# Patient Record
Sex: Female | Born: 1940
Health system: Southern US, Community
[De-identification: ages and names within clinical notes are randomized; demographics above are authoritative.]

## PROBLEM LIST (undated history)

## (undated) DIAGNOSIS — Z7189 Other specified counseling: Secondary | ICD-10-CM

## (undated) DIAGNOSIS — Z808 Family history of malignant neoplasm of other organs or systems: Secondary | ICD-10-CM

## (undated) DIAGNOSIS — C50212 Malignant neoplasm of upper-inner quadrant of left female breast: Secondary | ICD-10-CM

## (undated) DIAGNOSIS — F329 Major depressive disorder, single episode, unspecified: Secondary | ICD-10-CM

## (undated) DIAGNOSIS — H269 Unspecified cataract: Secondary | ICD-10-CM

## (undated) DIAGNOSIS — F419 Anxiety disorder, unspecified: Secondary | ICD-10-CM

## (undated) DIAGNOSIS — R55 Syncope and collapse: Secondary | ICD-10-CM

## (undated) DIAGNOSIS — K219 Gastro-esophageal reflux disease without esophagitis: Secondary | ICD-10-CM

## (undated) DIAGNOSIS — Z8042 Family history of malignant neoplasm of prostate: Secondary | ICD-10-CM

## (undated) DIAGNOSIS — E079 Disorder of thyroid, unspecified: Secondary | ICD-10-CM

## (undated) DIAGNOSIS — E785 Hyperlipidemia, unspecified: Secondary | ICD-10-CM

## (undated) DIAGNOSIS — Z5189 Encounter for other specified aftercare: Secondary | ICD-10-CM

## (undated) DIAGNOSIS — Z923 Personal history of irradiation: Secondary | ICD-10-CM

## (undated) DIAGNOSIS — Z803 Family history of malignant neoplasm of breast: Secondary | ICD-10-CM

## (undated) DIAGNOSIS — I639 Cerebral infarction, unspecified: Secondary | ICD-10-CM

## (undated) DIAGNOSIS — C50919 Malignant neoplasm of unspecified site of unspecified female breast: Secondary | ICD-10-CM

## (undated) DIAGNOSIS — C801 Malignant (primary) neoplasm, unspecified: Secondary | ICD-10-CM

## (undated) DIAGNOSIS — J45909 Unspecified asthma, uncomplicated: Secondary | ICD-10-CM

## (undated) DIAGNOSIS — T7840XA Allergy, unspecified, initial encounter: Secondary | ICD-10-CM

## (undated) DIAGNOSIS — I10 Essential (primary) hypertension: Secondary | ICD-10-CM

## (undated) DIAGNOSIS — Z9221 Personal history of antineoplastic chemotherapy: Secondary | ICD-10-CM

## (undated) DIAGNOSIS — E669 Obesity, unspecified: Secondary | ICD-10-CM

## (undated) DIAGNOSIS — N189 Chronic kidney disease, unspecified: Secondary | ICD-10-CM

## (undated) DIAGNOSIS — D649 Anemia, unspecified: Secondary | ICD-10-CM

## (undated) DIAGNOSIS — E039 Hypothyroidism, unspecified: Secondary | ICD-10-CM

## (undated) HISTORY — PX: TONSILLECTOMY: SUR1361

## (undated) HISTORY — DX: Personal history of irradiation: Z92.3

## (undated) HISTORY — DX: Disorder of thyroid, unspecified: E07.9

## (undated) HISTORY — DX: Unspecified cataract: H26.9

## (undated) HISTORY — DX: Unspecified asthma, uncomplicated: J45.909

## (undated) HISTORY — DX: Major depressive disorder, single episode, unspecified: F32.9

## (undated) HISTORY — PX: WISDOM TOOTH EXTRACTION: SHX21

## (undated) HISTORY — DX: Anxiety disorder, unspecified: F41.9

## (undated) HISTORY — DX: Family history of malignant neoplasm of prostate: Z80.42

## (undated) HISTORY — PX: ABDOMINAL HYSTERECTOMY: SHX81

## (undated) HISTORY — DX: Hyperlipidemia, unspecified: E78.5

## (undated) HISTORY — PX: CATARACT EXTRACTION: SUR2

## (undated) HISTORY — DX: Chronic kidney disease, unspecified: N18.9

## (undated) HISTORY — DX: Family history of malignant neoplasm of breast: Z80.3

## (undated) HISTORY — DX: Syncope and collapse: R55

## (undated) HISTORY — PX: OTHER SURGICAL HISTORY: SHX169

## (undated) HISTORY — DX: Family history of malignant neoplasm of other organs or systems: Z80.8

## (undated) HISTORY — PX: ROTATOR CUFF REPAIR: SHX139

## (undated) HISTORY — DX: Obesity, unspecified: E66.9

## (undated) HISTORY — DX: Allergy, unspecified, initial encounter: T78.40XA

## (undated) HISTORY — DX: Other specified counseling: Z71.89

## (undated) HISTORY — DX: Malignant neoplasm of upper-inner quadrant of left female breast: C50.212

## (undated) HISTORY — PX: COLONOSCOPY: SHX174

## (undated) HISTORY — DX: Gastro-esophageal reflux disease without esophagitis: K21.9

## (undated) HISTORY — DX: Anemia, unspecified: D64.9

## (undated) HISTORY — DX: Encounter for other specified aftercare: Z51.89

## (undated) HISTORY — DX: Essential (primary) hypertension: I10

## (undated) HISTORY — PX: EXCISIONAL HEMORRHOIDECTOMY: SHX1541

---

## 1999-04-02 ENCOUNTER — Ambulatory Visit (HOSPITAL_COMMUNITY): Admission: RE | Admit: 1999-04-02 | Discharge: 1999-04-02 | Payer: Self-pay | Admitting: Obstetrics & Gynecology

## 1999-12-29 ENCOUNTER — Encounter (INDEPENDENT_AMBULATORY_CARE_PROVIDER_SITE_OTHER): Payer: Self-pay | Admitting: Specialist

## 1999-12-29 ENCOUNTER — Ambulatory Visit (HOSPITAL_COMMUNITY): Admission: RE | Admit: 1999-12-29 | Discharge: 1999-12-29 | Payer: Self-pay | Admitting: *Deleted

## 2000-07-02 ENCOUNTER — Encounter: Payer: Self-pay | Admitting: Internal Medicine

## 2000-07-02 ENCOUNTER — Encounter: Admission: RE | Admit: 2000-07-02 | Discharge: 2000-07-02 | Payer: Self-pay | Admitting: Internal Medicine

## 2001-03-30 ENCOUNTER — Other Ambulatory Visit: Admission: RE | Admit: 2001-03-30 | Discharge: 2001-03-30 | Payer: Self-pay | Admitting: Obstetrics and Gynecology

## 2001-07-14 ENCOUNTER — Encounter: Admission: RE | Admit: 2001-07-14 | Discharge: 2001-07-14 | Payer: Self-pay | Admitting: Internal Medicine

## 2001-07-14 ENCOUNTER — Encounter: Payer: Self-pay | Admitting: Internal Medicine

## 2002-07-18 ENCOUNTER — Encounter: Payer: Self-pay | Admitting: Obstetrics and Gynecology

## 2002-07-18 ENCOUNTER — Encounter: Admission: RE | Admit: 2002-07-18 | Discharge: 2002-07-18 | Payer: Self-pay | Admitting: Obstetrics and Gynecology

## 2002-07-20 ENCOUNTER — Encounter: Payer: Self-pay | Admitting: Obstetrics and Gynecology

## 2002-07-20 ENCOUNTER — Encounter: Admission: RE | Admit: 2002-07-20 | Discharge: 2002-07-20 | Payer: Self-pay | Admitting: Obstetrics and Gynecology

## 2002-09-25 ENCOUNTER — Encounter (INDEPENDENT_AMBULATORY_CARE_PROVIDER_SITE_OTHER): Payer: Self-pay | Admitting: Specialist

## 2002-09-25 ENCOUNTER — Ambulatory Visit (HOSPITAL_COMMUNITY): Admission: RE | Admit: 2002-09-25 | Discharge: 2002-09-25 | Payer: Self-pay | Admitting: Internal Medicine

## 2003-04-04 ENCOUNTER — Encounter: Payer: Self-pay | Admitting: Internal Medicine

## 2003-04-04 ENCOUNTER — Encounter: Admission: RE | Admit: 2003-04-04 | Discharge: 2003-04-04 | Payer: Self-pay | Admitting: Internal Medicine

## 2003-04-23 ENCOUNTER — Ambulatory Visit (HOSPITAL_COMMUNITY): Admission: RE | Admit: 2003-04-23 | Discharge: 2003-04-23 | Payer: Self-pay | Admitting: *Deleted

## 2003-09-25 ENCOUNTER — Encounter: Admission: RE | Admit: 2003-09-25 | Discharge: 2003-09-25 | Payer: Self-pay | Admitting: Internal Medicine

## 2003-10-05 ENCOUNTER — Other Ambulatory Visit: Admission: RE | Admit: 2003-10-05 | Discharge: 2003-10-05 | Payer: Self-pay | Admitting: Endocrinology

## 2004-10-20 ENCOUNTER — Encounter: Admission: RE | Admit: 2004-10-20 | Discharge: 2004-10-20 | Payer: Self-pay | Admitting: Internal Medicine

## 2005-11-13 ENCOUNTER — Encounter: Admission: RE | Admit: 2005-11-13 | Discharge: 2005-11-13 | Payer: Self-pay | Admitting: Internal Medicine

## 2006-08-24 ENCOUNTER — Other Ambulatory Visit: Admission: RE | Admit: 2006-08-24 | Discharge: 2006-08-24 | Payer: Self-pay | Admitting: Cardiology

## 2006-11-15 ENCOUNTER — Encounter: Admission: RE | Admit: 2006-11-15 | Discharge: 2006-11-15 | Payer: Self-pay | Admitting: Internal Medicine

## 2007-11-18 ENCOUNTER — Encounter: Admission: RE | Admit: 2007-11-18 | Discharge: 2007-11-18 | Payer: Self-pay | Admitting: Internal Medicine

## 2008-06-14 ENCOUNTER — Encounter (INDEPENDENT_AMBULATORY_CARE_PROVIDER_SITE_OTHER): Payer: Self-pay | Admitting: *Deleted

## 2008-06-14 ENCOUNTER — Ambulatory Visit (HOSPITAL_COMMUNITY): Admission: RE | Admit: 2008-06-14 | Discharge: 2008-06-14 | Payer: Self-pay | Admitting: *Deleted

## 2008-08-05 ENCOUNTER — Emergency Department (HOSPITAL_COMMUNITY): Admission: EM | Admit: 2008-08-05 | Discharge: 2008-08-05 | Payer: Self-pay | Admitting: Family Medicine

## 2008-11-20 ENCOUNTER — Encounter: Admission: RE | Admit: 2008-11-20 | Discharge: 2008-11-20 | Payer: Self-pay | Admitting: Internal Medicine

## 2009-12-05 ENCOUNTER — Encounter: Admission: RE | Admit: 2009-12-05 | Discharge: 2009-12-05 | Payer: Self-pay | Admitting: Internal Medicine

## 2011-01-06 ENCOUNTER — Other Ambulatory Visit: Payer: Self-pay | Admitting: Internal Medicine

## 2011-01-06 DIAGNOSIS — Z1231 Encounter for screening mammogram for malignant neoplasm of breast: Secondary | ICD-10-CM

## 2011-01-28 ENCOUNTER — Ambulatory Visit
Admission: RE | Admit: 2011-01-28 | Discharge: 2011-01-28 | Disposition: A | Discharge status: Home / Self Care | Payer: Medicare Other | Source: Ambulatory Visit | Attending: Internal Medicine | Admitting: Internal Medicine

## 2011-01-28 DIAGNOSIS — Z1231 Encounter for screening mammogram for malignant neoplasm of breast: Secondary | ICD-10-CM

## 2011-03-17 NOTE — Op Note (Signed)
NAME:  Jessica [name], Jessica [name] NO.:  [id number]   MEDICAL RECORD NO.:  [id number]          PATIENT TYPE:  AMB   LOCATION:  ENDO                         FACILITY:  [facility]   PHYSICIAN:  Georgiana Spinner, M.D.    DATE OF BIRTH:  July 25, 1941   DATE OF PROCEDURE:  06/14/2008  DATE OF DISCHARGE:                               OPERATIVE REPORT   PROCEDURE:  Colonoscopy.   INDICATIONS:  Colon cancer screening, colon polyps.   ANESTHESIA:  Fentanyl 50 mcg, Versed 6 mg.   PROCEDURE IN DETAIL:  With the patient mildly sedated in the left  lateral decubitus position the Pentax videoscopic colonoscope was  inserted in the rectum and passed under direct vision to the sigmoid  colon but was difficult to pass any further  so I withdrew this  colonoscope taking circumferential views of colonic mucosa, stopping in  the rectum which appeared normal on direct and showed hemorrhoids on  retroflexed view.  The endoscope was straightened and withdrawn.  Subsequently the Pentax videoscopic pediatric colonoscope was inserted  in the rectum and passed under direct vision again with pressure applied  and subsequently with the patient rolled to her back we were able to  advance to the cecum which was identified by base of cecum and ileocecal  valve, both of which were photographed.  From this point the colonoscope  was slowly withdrawn taking circumferential views of colonic mucosa,  stopping only in the ascending colon near the hepatic flexure where a  polyp was seen, photographed and removed using hot biopsy forceps  technique at a setting of 20/150 blended current.  We then withdrew all  the way to the rectum, which appeared normal on indirect view and I did  not retroflex once again.  The endoscope was withdrawn.  The patient's  vital signs, pulse oximeter remained stable.  The patient tolerated the  procedure well without apparent complications.   FINDINGS:  Polyp at hepatic flexure, internal  hemorrhoids seen on  retroflexed view, otherwise unremarkable examination.   PLAN:  Await biopsy report.  The patient will call me for results and  follow up with me as needed as an outpatient.           ______________________________  Georgiana Spinner, M.D.     GMO/MEDQ  D:  06/14/2008  T:  06/14/2008  Job:  [id number]

## 2011-03-17 NOTE — Op Note (Signed)
NAME:  Jessica [name], Jessica [name] NO.:  [id number]   MEDICAL RECORD NO.:  [id number]          PATIENT TYPE:  AMB   LOCATION:  ENDO                         FACILITY:  [facility]   PHYSICIAN:  Georgiana Spinner, M.D.    DATE OF BIRTH:  1941-03-02   DATE OF PROCEDURE:  06/14/2008  DATE OF DISCHARGE:                               OPERATIVE REPORT   PROCEDURE:  Upper endoscopy.   INDICATIONS:  GERD symptoms of belching, burping, late-onset asthma and  reflux.   ANESTHESIA:  Fentanyl 75 mcg, Versed 6 mg.   PROCEDURE:  With the patient mildly sedated in the left lateral  decubitus position, the Pentax videoscopic endoscope was inserted in the  mouth, passed under direct vision through the esophagus which appeared  normal into the stomach.  Fundus, body, antrum, duodenal bulb, second  portion of the duodenum were visualized.  From this point, the endoscope  was slowly withdrawn taking circumferential views of duodenal mucosa  until the endoscope been pulled back into stomach and placed in  retroflexion to view the stomach from below, and it was noted that there  was some glandular-appearing material on the gastric side of the  squamocolumnar junction.  No evidence of Barrett's was seen, however,  but biopsy of this area was taken.  The endoscope was straightened and  withdrawn taking circumferential views remaining gastric and esophageal  mucosa.  The patient's vital signs and pulse oximeter remained stable.  The patient tolerated the procedure well without apparent complications.   FINDINGS:  Changes in the mucosa of the stomach just distal to the  squamocolumnar junction.  Await biopsy report.  The patient will call me  for results and follow-up with me as an outpatient.  Proceed to  colonoscopy           ______________________________  Georgiana Spinner, M.D.     GMO/MEDQ  D:  06/14/2008  T:  06/14/2008  Job:  [id number]

## 2011-03-20 NOTE — Op Note (Signed)
   NAME:  Jessica [name], PAT NO.:  [id number]   MEDICAL RECORD NO.:  [id number]                   PATIENT TYPE:  AMB   LOCATION:  ENDO                                 FACILITY:  [facility]   PHYSICIAN:  Georgiana Spinner, M.D.                 DATE OF BIRTH:  01/06/1941   DATE OF PROCEDURE:  04/23/2003  DATE OF DISCHARGE:                                 OPERATIVE REPORT   PROCEDURE:  Upper endoscopy.   INDICATIONS:  GERD.   ANESTHESIA:  Demerol 50 mg, Versed 5 mg.   PROCEDURE:  With the patient mildly sedated in the left lateral decubitus  position, the Olympus videoscopic endoscopic endoscope was inserted in the  mouth, passed under direct vision through the esophagus, which appeared  normal, into the stomach, where a small amount of blood flecks were seen and  photographed.  We advanced to the duodenal bulb, second portion of the  duodenum, both of which appeared normal.  From this point the endoscope was  slowly withdrawn, taking circumferential views of the duodenal mucosa until  the endoscope had been pulled back into the stomach, placed in retroflexion  to view the stomach from below.  It was noted that the GE junction did not  wrap tightly around the endoscope, and this was photographed.  The endoscope  was then straightened and withdrawn, taking circumferential views of the  remaining gastric and esophageal mucosa.  The patient's vital signs and  pulse oximetry remained stable.  The patient tolerated the procedure well  without apparent complications.   FINDINGS:  Incomplete wrap of the gastroesophageal junction around the  endoscope, otherwise an unremarkable examination other than some sparse  amount of blood flecks in the stomach.   PLAN:  Proceed to colonoscopy.                                               Georgiana Spinner, M.D.    GMO/MEDQ  D:  04/23/2003  T:  04/24/2003  Job:  [id number]

## 2011-03-20 NOTE — Op Note (Signed)
   NAME:  Jessica [name], DOUTT NO.:  [id number]   MEDICAL RECORD NO.:  [id number]                   PATIENT TYPE:  AMB   LOCATION:  ENDO                                 FACILITY:  [facility]   PHYSICIAN:  Georgiana Spinner, M.D.                 DATE OF BIRTH:  11-23-1940   DATE OF PROCEDURE:  DATE OF DISCHARGE:                                 OPERATIVE REPORT   PROCEDURE:  Colonoscopy.   INDICATIONS FOR PROCEDURE:  Colon polyps.   ANESTHESIA:  Demerol 20, Versed 2 mg.   DESCRIPTION OF PROCEDURE:  With the patient mildly sedated in the left  lateral decubitus position, the Olympus videoscopic colonoscope was inserted  in the rectum and passed under direct vision to the cecum identified by the  ileocecal valve and appendiceal orifice both of which were photographed.  From this point, the colonoscope was slowly withdrawn taking circumferential  views of the entire colonic mucosa stopping only in the rectum which  appeared normal on direct and showed hemorrhoids on retroflexed view. The  endoscope was straightened and withdrawn. The patient's vital signs and  pulse oximeter remained stable. The patient tolerated the procedure well  without apparent complications.   FINDINGS:  Internal hemorrhoids, otherwise, unremarkable colonoscopic  examination to the cecum.                                               Georgiana Spinner, M.D.    GMO/MEDQ  D:  04/23/2003  T:  04/24/2003  Job:  [id number]

## 2011-03-20 NOTE — Op Note (Signed)
   NAME:  Jessica [name], Jessica [name]                       ACCOUNT NO.:  [id number]   MEDICAL RECORD NO.:  [id number]                   PATIENT TYPE:  AMB   LOCATION:  SDC                                  FACILITY:  [facility]   PHYSICIAN:  Malachi Pro. Ambrose Mantle, M.D.              DATE OF BIRTH:  1941/04/11   DATE OF PROCEDURE:  09/25/2002  DATE OF DISCHARGE:                                 OPERATIVE REPORT   PREOPERATIVE DIAGNOSES:  1. Perineal cyst.  2. Dyspareunia.   POSTOPERATIVE DIAGNOSES:  1. Perineal cyst.  2. Dyspareunia.   OPERATION:  Removal of perineal cyst.   OPERATOR:  Malachi Pro. Ambrose Mantle, M.D.   ANESTHESIA:  1% Xylocaine local block 9 cc.   PROCEDURE:  The patient was placed in lithotomy position in [place]lik stirrups.  The area around the lesion on the perineum was prepped with Betadine  solution.  The area was draped as a sterile field.  9 cc of 1% Xylocaine was  injected all the way around the lesion and inferior to the lesion.  This  amount was necessary to give the patient complete anesthesia.  An elliptical  incision was then made around the cystic lesion which was just distal to the  vaginal introitus and went down to about one-half way on the perineum.  I  incised around the lesion and then by picking up on the incised skin that  was not part of the cyst I was able to elevate the lesion and gradually cut  the tissue surrounding the lesion so that the lesion was removed intact.  It  was about a 1 cm lesion.  A 4-0 Vicryl suture was used to reconstruct the  perineum with a running locked suture through the subcutaneous tissue and  then a running locked suture through the skin.  The patient seemed to  tolerate the procedure well.  Blood loss was no more than 5 or 10 cc and the  patient was returned to recovery in satisfactory condition.                                               Malachi Pro. Ambrose Mantle, M.D.    TFH/MEDQ  D:  09/25/2002  T:  09/25/2002  Job:  [id number]

## 2011-04-02 ENCOUNTER — Emergency Department (HOSPITAL_COMMUNITY)
Admission: EM | Admit: 2011-04-02 | Discharge: 2011-04-02 | Disposition: A | Discharge status: Home / Self Care | Payer: Medicare Other | Attending: Emergency Medicine | Admitting: Emergency Medicine

## 2011-04-02 DIAGNOSIS — E7800 Pure hypercholesterolemia, unspecified: Secondary | ICD-10-CM | POA: Insufficient documentation

## 2011-04-02 DIAGNOSIS — I10 Essential (primary) hypertension: Secondary | ICD-10-CM | POA: Insufficient documentation

## 2011-04-02 DIAGNOSIS — Z79899 Other long term (current) drug therapy: Secondary | ICD-10-CM | POA: Insufficient documentation

## 2011-04-02 DIAGNOSIS — R42 Dizziness and giddiness: Secondary | ICD-10-CM | POA: Insufficient documentation

## 2011-04-02 DIAGNOSIS — R112 Nausea with vomiting, unspecified: Secondary | ICD-10-CM | POA: Insufficient documentation

## 2011-04-02 DIAGNOSIS — R197 Diarrhea, unspecified: Secondary | ICD-10-CM | POA: Insufficient documentation

## 2011-04-02 DIAGNOSIS — R61 Generalized hyperhidrosis: Secondary | ICD-10-CM | POA: Insufficient documentation

## 2011-04-02 DIAGNOSIS — R109 Unspecified abdominal pain: Secondary | ICD-10-CM | POA: Insufficient documentation

## 2011-04-02 DIAGNOSIS — R55 Syncope and collapse: Secondary | ICD-10-CM | POA: Insufficient documentation

## 2011-04-02 DIAGNOSIS — E039 Hypothyroidism, unspecified: Secondary | ICD-10-CM | POA: Insufficient documentation

## 2011-04-02 LAB — GLUCOSE, CAPILLARY: Glucose-Capillary: 102 mg/dL — ABNORMAL HIGH (ref 70–99)

## 2011-04-02 LAB — POCT I-STAT, CHEM 8
BUN: 12 mg/dL (ref 6–23)
Calcium, Ion: 1.19 mmol/L (ref 1.12–1.32)
Chloride: 104 mEq/L (ref 96–112)
Creatinine, Ser: 1.1 mg/dL (ref 0.4–1.2)
Glucose, Bld: 116 mg/dL — ABNORMAL HIGH (ref 70–99)
HCT: 40 % (ref 36.0–46.0)
Hemoglobin: 13.6 g/dL (ref 12.0–15.0)
Potassium: 4.1 mEq/L (ref 3.5–5.1)
Sodium: 141 mEq/L (ref 135–145)
TCO2: 27 mmol/L (ref 0–100)

## 2012-01-25 ENCOUNTER — Other Ambulatory Visit: Payer: Self-pay | Admitting: Internal Medicine

## 2012-01-25 DIAGNOSIS — Z1231 Encounter for screening mammogram for malignant neoplasm of breast: Secondary | ICD-10-CM

## 2012-02-03 ENCOUNTER — Ambulatory Visit
Admission: RE | Admit: 2012-02-03 | Discharge: 2012-02-03 | Disposition: A | Discharge status: Home / Self Care | Payer: Medicare Other | Source: Ambulatory Visit | Attending: Internal Medicine | Admitting: Internal Medicine

## 2012-02-03 DIAGNOSIS — Z1231 Encounter for screening mammogram for malignant neoplasm of breast: Secondary | ICD-10-CM

## 2013-04-03 ENCOUNTER — Other Ambulatory Visit: Payer: Self-pay

## 2013-04-03 DIAGNOSIS — Z1231 Encounter for screening mammogram for malignant neoplasm of breast: Secondary | ICD-10-CM

## 2013-05-02 ENCOUNTER — Ambulatory Visit
Admission: RE | Admit: 2013-05-02 | Discharge: 2013-05-02 | Disposition: A | Discharge status: Home / Self Care | Payer: Medicare Other | Source: Ambulatory Visit

## 2013-05-02 DIAGNOSIS — Z1231 Encounter for screening mammogram for malignant neoplasm of breast: Secondary | ICD-10-CM

## 2013-07-21 ENCOUNTER — Encounter: Payer: Self-pay | Admitting: Gastroenterology

## 2013-08-29 ENCOUNTER — Encounter: Payer: Self-pay | Admitting: Cardiology

## 2013-08-29 ENCOUNTER — Ambulatory Visit (INDEPENDENT_AMBULATORY_CARE_PROVIDER_SITE_OTHER): Payer: Medicare Other | Admitting: Cardiology

## 2013-08-29 VITALS — BP 128/70 | HR 75 | Ht 61.0 in | Wt 174.7 lb

## 2013-08-29 DIAGNOSIS — F329 Major depressive disorder, single episode, unspecified: Secondary | ICD-10-CM

## 2013-08-29 DIAGNOSIS — R55 Syncope and collapse: Secondary | ICD-10-CM

## 2013-08-29 DIAGNOSIS — F3289 Other specified depressive episodes: Secondary | ICD-10-CM

## 2013-08-29 DIAGNOSIS — F32A Depression, unspecified: Secondary | ICD-10-CM

## 2013-08-29 DIAGNOSIS — E669 Obesity, unspecified: Secondary | ICD-10-CM

## 2013-08-29 DIAGNOSIS — I10 Essential (primary) hypertension: Secondary | ICD-10-CM

## 2013-08-29 DIAGNOSIS — E785 Hyperlipidemia, unspecified: Secondary | ICD-10-CM

## 2013-08-29 NOTE — Patient Instructions (Signed)
Keep hydrate, when you urinate ,your urine should be clear  Your physician wants you to follow-up in 12 MONTH DR [facility].  You will receive a reminder letter in the mail two months in advance. If you don't receive a letter, please call our office to schedule the follow-up appointment.

## 2013-08-29 NOTE — Progress Notes (Signed)
PATIENT: Jessica [name] MRN: [id number]  DOB: 1941-04-16   DOV:08/31/2013 PCP: Jessica Moh, MD  Clinic Note: Chief Complaint  Patient presents with  . ROV 1 year    RAW-DH. No complaints. Pt did have pneumonia-w/in the last month.   HPI: Jessica [name] is a 72 y.o. female with a PMH below (most notably vasovagal near syncope) who presents today for one-year followup. She is a for patient of Dr. Alanda Amass to be off last in July 2013. He states that he saw her in June of 2012 to evaluate 3 near-syncopal episodes with the last one being in May of that year. She had transient episode without frank loss of consciousness she does have a mild short PR interval, there's been no history or documentation of palpitations or tachyarrhythmias. Dr. Alanda Amass felt these symptoms were likely related to neurocardiogenic vasovagal reactions. She has had several cardiac studies done as part of her evaluations:   Myoview stress test that was negative for ischemia. The patient was able to walk 8:11 minutes reaching 10 METs with no chest pain or ECG changes.   2-D echocardiogram" no significant valvular lesion. No structural heart disease. Normal LV function. He did not place a monitor, as her episodes of near-syncope were buried widely spread.  Interval History: Since 2012, she has not had any recurrent symptoms. She denies any rapid or irregular heartbeats. No syncope or near-syncope. No lightheadedness or dizziness with the exception of occasionally after straining hard bowel movement. She has occasional mild bleeding hemorrhoids, but no real major melena or hematochezia. At no time has she ever had any chest tightness or pressure with rest or exertion. No PND, orthopnea or edema. No TIA or amaurosis fugax symptoms.  Past Medical History  Diagnosis Date  . Essential hypertension - well controlled 08/31/2013  . Vasovagal near-syncope -rare 08/31/2013  . Dyslipidemia, goal LDL below 160  08/31/2013  . Obesity (BMI 30-39.9) 08/31/2013  . Depression 08/31/2013    Prior Cardiac Evaluation and Past Surgical History: History reviewed. No pertinent past surgical history.  Allergies  Allergen Reactions  . Penicillins Hives    History   Social History Narrative   Married mother of 3 with one grandchild. Never smoked. Does not drink alcohol.   Walks 3-4 days a week for roughly 20-30 minutes.    ROS: A comprehensive Review of Systems - Essentially negative is not indicated above. She notes that she is recovering from her recent recovery from her recent bout of pneumonia. She is feeling very poorly with fevers and chills and coughing, she went to urgent care clinic and was diagnosed with pneumonia based on examination. She started antibiotics and felt much better since. However she does still feel a little bit fatigued while recovering.  PHYSICAL EXAM BP 128/70  Pulse 75  Ht 5\' 1"  (1.549 m)  Wt 174 lb 11.2 oz (79.243 kg)  BMI 33.03 kg/m2 General appearance: alert, cooperative, appears stated age and no distress Neck: no adenopathy, no carotid bruit, no JVD and supple, symmetrical, trachea midline Lungs: clear to auscultation bilaterally, normal percussion bilaterally and Nonlabored, good air movement Heart: regular rate and rhythm, S1, S2 normal, soft systolic murmur at the left border, click, rub or gallop and normal apical impulse Abdomen: soft, non-tender; bowel sounds normal; no masses,  no organomegaly Extremities: extremities normal, atraumatic, no cyanosis or edema Pulses: 2+ and symmetric Neurologic: Alert and oriented X 3, normal strength and tone. Normal symmetric reflexes. Normal coordination and gait Cranial nerves:  normal  UJW:[id number] today: Yes Rate: 75 , Rhythm: NSR, LAFB, incomplete RBBB, T. inversions in 1 and aVL previously documented; short PR interval 130 ms (stable)  Recent Labs: None available  ASSESSMENT / PLAN: Vasovagal near-syncope  -rare Relatively stable. No active symptoms. She has not had any palpitations. Due to the potential of orthostatic symptoms, Dr. Alanda Amass at reduced the ACE inhibitor dose to 10 mg, however this is not recorded on her medications. I want her to good date the 10th of the 20 mg dose.  For now watch for monitoring. Ensuring adequate hydration. Avoiding hypotension with aggressive blood pressure control.  Essential hypertension - well controlled ACE inhibitor and HCTZ. Monitor for hypovolemia and hypotension given the history of vasovagal symptoms.  Dyslipidemia, goal LDL below 160 On statin, monitored by PCP.  Obesity (BMI 30-39.9) I did talk about the importance of dietary modification and exercise to avoid weight gain. Low position would try to lose some weight to get down below the obesity range. Target would be up to 3 pounds per month.   No orders of the defined types were placed in this encounter.    Followup: One year  Yosselyn Tax W. Herbie Baltimore, M.D., M.S. [facility] [address] [place], [place]  [address]  [phone] Pager # [phone]

## 2013-08-31 ENCOUNTER — Encounter: Payer: Self-pay | Admitting: Cardiology

## 2013-08-31 DIAGNOSIS — E669 Obesity, unspecified: Secondary | ICD-10-CM | POA: Insufficient documentation

## 2013-08-31 DIAGNOSIS — I10 Essential (primary) hypertension: Secondary | ICD-10-CM

## 2013-08-31 DIAGNOSIS — E785 Hyperlipidemia, unspecified: Secondary | ICD-10-CM

## 2013-08-31 DIAGNOSIS — R55 Syncope and collapse: Secondary | ICD-10-CM

## 2013-08-31 DIAGNOSIS — F329 Major depressive disorder, single episode, unspecified: Secondary | ICD-10-CM | POA: Insufficient documentation

## 2013-08-31 DIAGNOSIS — F32A Depression, unspecified: Secondary | ICD-10-CM

## 2013-08-31 HISTORY — DX: Depression, unspecified: F32.A

## 2013-08-31 HISTORY — DX: Obesity, unspecified: E66.9

## 2013-08-31 HISTORY — DX: Hyperlipidemia, unspecified: E78.5

## 2013-08-31 HISTORY — DX: Essential (primary) hypertension: I10

## 2013-08-31 HISTORY — DX: Syncope and collapse: R55

## 2013-08-31 NOTE — Assessment & Plan Note (Signed)
ACE inhibitor and HCTZ. Monitor for hypovolemia and hypotension given the history of vasovagal symptoms.

## 2013-08-31 NOTE — Assessment & Plan Note (Signed)
Relatively stable. No active symptoms. She has not had any palpitations. Due to the potential of orthostatic symptoms, Dr. Alanda Amass at reduced the ACE inhibitor dose to 10 mg, however this is not recorded on her medications. I want her to good date the 10th of the 20 mg dose.  For now watch for monitoring. Ensuring adequate hydration. Avoiding hypotension with aggressive blood pressure control.

## 2013-08-31 NOTE — Assessment & Plan Note (Signed)
I did talk about the importance of dietary modification and exercise to avoid weight gain. Low position would try to lose some weight to get down below the obesity range. Target would be up to 3 pounds per month.

## 2013-08-31 NOTE — Assessment & Plan Note (Signed)
On statin, monitored by PCP. 

## 2013-09-12 ENCOUNTER — Emergency Department (HOSPITAL_COMMUNITY)
Admission: EM | Admit: 2013-09-12 | Discharge: 2013-09-12 | Disposition: A | Discharge status: Home / Self Care | Payer: Medicare Other | Source: Home / Self Care

## 2013-09-12 ENCOUNTER — Encounter (HOSPITAL_COMMUNITY): Payer: Self-pay | Admitting: Emergency Medicine

## 2013-09-12 DIAGNOSIS — R0982 Postnasal drip: Secondary | ICD-10-CM

## 2013-09-12 DIAGNOSIS — J029 Acute pharyngitis, unspecified: Secondary | ICD-10-CM

## 2013-09-12 LAB — POCT RAPID STREP A: Streptococcus, Group A Screen (Direct): NEGATIVE

## 2013-09-12 MED ORDER — IPRATROPIUM BROMIDE 0.06 % NA SOLN: 2.0000 | Freq: Four times a day (QID) | NASAL | Status: DC | PRN

## 2013-09-12 MED ORDER — CETIRIZINE HCL 10 MG PO TABS: 10.0000 mg | ORAL_TABLET | Freq: Every day | ORAL | Status: DC

## 2013-09-12 NOTE — ED Provider Notes (Signed)
CSN: [id number]     Arrival date & time 09/12/13  1135 History   None    Chief Complaint  Patient presents with  . Sore Throat   (Consider location/radiation/quality/duration/timing/severity/associated sxs/prior Treatment) HPI Comments: 72 year old female presents complaining of sore throat. This began 3 days ago. She has constant sore throat that is worse in the morning when she first wakes up. She has taken Tylenol and saltwater gargles for this which has not helped. She denies fever, chills, NVD, rhinorrhea, postnasal drip, fatigue, or any other symptoms. She does not feel ill at this time.  Patient is a 72 y.o. female presenting with pharyngitis.  Sore Throat Pertinent negatives include no chest pain, no abdominal pain and no shortness of breath.    Past Medical History  Diagnosis Date  . Essential hypertension - well controlled 08/31/2013  . Vasovagal near-syncope -rare 08/31/2013  . Dyslipidemia, goal LDL below 160 08/31/2013  . Obesity (BMI 30-39.9) 08/31/2013  . Depression 08/31/2013   History reviewed. No pertinent past surgical history. History reviewed. No pertinent family history. History  Substance Use Topics  . Smoking status: Never Smoker   . Smokeless tobacco: Never Used  . Alcohol Use: No   OB History   Grav Para Term Preterm Abortions TAB SAB Ect Mult Living                 Review of Systems  Constitutional: Negative for fever and chills.  HENT: Positive for sore throat.   Eyes: Negative for visual disturbance.  Respiratory: Negative for cough and shortness of breath.   Cardiovascular: Negative for chest pain, palpitations and leg swelling.  Gastrointestinal: Negative for nausea, vomiting and abdominal pain.  Endocrine: Negative for polydipsia and polyuria.  Genitourinary: Negative for dysuria, urgency and frequency.  Musculoskeletal: Negative for arthralgias and myalgias.  Skin: Negative for rash.  Neurological: Negative for dizziness, weakness and  light-headedness.    Allergies  Codeine and Penicillins  Home Medications   Current Outpatient Rx  Name  Route  Sig  Dispense  Refill  . benazepril (LOTENSIN) 20 MG tablet   Oral   Take 20 mg by mouth daily.         . calcium citrate-vitamin D (CITRACAL+D) 315-200 MG-UNIT per tablet   Oral   Take 1 tablet by mouth daily.         . cetirizine (ZYRTEC) 10 MG tablet   Oral   Take 1 tablet (10 mg total) by mouth daily.   30 tablet   0   . CRESTOR 10 MG tablet   Oral   Take 5 mg by mouth at bedtime.         . diphenhydramine-acetaminophen (TYLENOL PM) 25-500 MG TABS   Oral   Take 1 tablet by mouth at bedtime as needed.         . hydrochlorothiazide (MICROZIDE) 12.5 MG capsule   Oral   Take 12.5 mg by mouth daily.         Marland Kitchen ipratropium (ATROVENT) 0.06 % nasal spray   Each Nare   Place 2 sprays into both nostrils 4 (four) times daily as needed for rhinitis.   15 mL   12   . levothyroxine (SYNTHROID, LEVOTHROID) 75 MCG tablet   Oral   Take 75 mcg by mouth daily.         . Multiple Vitamin (MULTIVITAMIN) tablet   Oral   Take 1 tablet by mouth daily.         Marland Kitchen  omeprazole (PRILOSEC OTC) 20 MG tablet   Oral   Take 20 mg by mouth daily.         Marland Kitchen venlafaxine XR (EFFEXOR-XR) 75 MG 24 hr capsule   Oral   Take 225 mg by mouth daily.          BP 136/73  Pulse 74  Temp(Src) 98.6 F (37 C) (Oral)  Resp 18  SpO2 99% Physical Exam  Nursing note and vitals reviewed. Constitutional: She is oriented to person, place, and time. Vital signs are normal. She appears well-developed and well-nourished. No distress.  HENT:  Head: Normocephalic and atraumatic.  Mouth/Throat: Posterior oropharyngeal erythema (mild) present. No oropharyngeal exudate or posterior oropharyngeal edema.  Cobblestoning of the posterior oropharynx and posterior tongue  Pulmonary/Chest: Effort normal. No respiratory distress.  Neurological: She is alert and oriented to person, place,  and time. She has normal strength. Coordination normal.  Skin: Skin is warm and dry. No rash noted. She is not diaphoretic.  Psychiatric: She has a normal mood and affect. Judgment normal.    ED Course  Procedures (including critical care time) Labs Review Labs Reviewed  POCT RAPID STREP A ([facility]RG CARE ONLY)   Imaging Review No results found.    MDM   1. Pharyngitis   2. Postnasal drip    Sore throat is most likely being caused by postnasal drip. Rapid strep is negative. Treating with Zyrtec and with Atrovent nasal spray. Followup when necessary.   Meds ordered this encounter  Medications  . ipratropium (ATROVENT) 0.06 % nasal spray    Sig: Place 2 sprays into both nostrils 4 (four) times daily as needed for rhinitis.    Dispense:  15 mL    Refill:  12    Order Specific Question:  Supervising Provider    Answer:  Linna Hoff [id number]  . cetirizine (ZYRTEC) 10 MG tablet    Sig: Take 1 tablet (10 mg total) by mouth daily.    Dispense:  30 tablet    Refill:  0    Order Specific Question:  Supervising Provider    Answer:  Bradd Canary D [[id number]]       Graylon Good, PA-C 09/12/13 1253  Graylon Good, PA-C 09/12/13 1254

## 2013-09-12 NOTE — ED Notes (Signed)
Pt  Reports    Symptoms of  sorethroat     With  Pain  When  She swallows    With  The  Symptoms  For    About  3  Days         -  She  Is  Sitting  Upright on the  Exam table in no  Acute  Distress              She  Reports  Had  pnuemonia  About 1  Month ago  Which  Resolved

## 2013-09-14 ENCOUNTER — Telehealth (HOSPITAL_COMMUNITY): Payer: Self-pay | Admitting: *Deleted

## 2013-09-14 LAB — CULTURE, GROUP A STREP

## 2013-09-14 NOTE — ED Provider Notes (Signed)
Medical screening examination/treatment/procedure(s) were performed by resident physician or non-physician practitioner and as supervising physician I was immediately available for consultation/collaboration.   Gussie Murton DOUGLAS MD.   Roth Ress D Loretta Kluender, MD 09/14/13 1919 

## 2013-09-14 NOTE — ED Notes (Signed)
Throat culture: Group A Strep (S. Pyogenes).  Discussed with Dr. Artis Flock and ordered Z-pak -take as directed. I called and left a message for pt. to call. Vassie Moselle 09/14/2013

## 2013-10-03 ENCOUNTER — Encounter: Payer: Medicare Other | Admitting: Gastroenterology

## 2013-10-20 ENCOUNTER — Encounter: Payer: Self-pay | Admitting: Cardiovascular Disease

## 2013-12-05 ENCOUNTER — Encounter: Payer: Medicare Other | Admitting: Gastroenterology

## 2014-01-03 ENCOUNTER — Ambulatory Visit (AMBULATORY_SURGERY_CENTER): Payer: Self-pay

## 2014-01-03 VITALS — Ht 61.5 in | Wt 170.0 lb

## 2014-01-03 DIAGNOSIS — Z860100 Personal history of colon polyps, unspecified: Secondary | ICD-10-CM

## 2014-01-03 MED ORDER — MOVIPREP 100 G PO SOLR: 1.0000 | Freq: Once | ORAL | Status: DC

## 2014-01-08 ENCOUNTER — Encounter: Payer: Self-pay | Admitting: Gastroenterology

## 2014-01-16 ENCOUNTER — Ambulatory Visit (AMBULATORY_SURGERY_CENTER): Payer: Medicare Other | Admitting: Gastroenterology

## 2014-01-16 ENCOUNTER — Encounter: Payer: Self-pay | Admitting: Gastroenterology

## 2014-01-16 VITALS — BP 134/69 | HR 64 | Temp 98.4°F | Resp 14 | Ht 61.5 in | Wt 170.0 lb

## 2014-01-16 DIAGNOSIS — D126 Benign neoplasm of colon, unspecified: Secondary | ICD-10-CM

## 2014-01-16 DIAGNOSIS — Z86010 Personal history of colonic polyps: Secondary | ICD-10-CM

## 2014-01-16 MED ORDER — SODIUM CHLORIDE 0.9 % IV SOLN: 500.0000 mL | INTRAVENOUS | Status: DC

## 2014-01-16 NOTE — Progress Notes (Signed)
Scope change from 04 to 55 a pediatric scope.

## 2014-01-16 NOTE — Progress Notes (Signed)
Called to room to assist during endoscopic procedure.  Patient ID and intended procedure confirmed with present staff. Received instructions for my participation in the procedure from the performing physician.  

## 2014-01-16 NOTE — Progress Notes (Signed)
Report to pacu RN, vss, bbs=clear, Sx during case for clear saliva while "dry Heving", BBS=, Clear throughout procedure airway reflexes intact.

## 2014-01-16 NOTE — Op Note (Signed)
[place]  [address] [place], [id number]   COLONOSCOPY PROCEDURE REPORT PATIENT: Jessica [name], Jessica [name]  MR#: [id number] BIRTHDATE: 29-Nov-1940 , 72  yrs. old GENDER: Female ENDOSCOPIST: Ladene Artist, MD, [facility] REFERRED JO:INOMVE Delight Hoh, M.D. PROCEDURE DATE:  01/16/2014 PROCEDURE:   Colonoscopy with snare polypectomy First Screening Colonoscopy - Avg.  risk and is 50 yrs.  old or older - No.  Prior Negative Screening - Now for repeat screening. N/A  History of Adenoma - Now for follow-up colonoscopy & has been > or = to 3 yrs.  Yes hx of adenoma.  Has been 3 or more years since last colonoscopy.  Polyps Removed Today? Yes. ASA CLASS:   Class II INDICATIONS:Patient's personal history of adenomatous colon polyps.  MEDICATIONS: MAC sedation, administered by CRNA and propofol (Diprivan) 300mg  IV DESCRIPTION OF PROCEDURE:   After the risks benefits and alternatives of the procedure were thoroughly explained, informed consent was obtained.  A digital rectal exam revealed external hemorrhoids.   The LB [id number]  endoscope was introduced through the anus and advanced to the cecum, which was identified by both the appendix and ileocecal valve. No adverse events experienced with a tortuous colon.   The quality of the prep was adequate after extensive rinsing and suctionin, using MoviPrep  The instrument was then slowly withdrawn as the colon was fully examined.  COLON FINDINGS: A sessile polyp measuring 1 cm in size was found at the cecum.  A polypectomy was performed using snare cautery.  The resection was complete and the polyp tissue was completely retrieved.   A sessile polyp measuring 6 mm in size was found in the transverse colon.  A polypectomy was performed with a cold snare.  The resection was complete and the polyp tissue was completely retrieved.   The colon was otherwise normal.  There was no diverticulosis, inflammation, polyps or cancers  unless previously stated.  Retroflexed views revealed internal hemorrhoids. The time to cecum=4 minutes 44 seconds.  Withdrawal time=17 minutes 09 seconds.  The scope was withdrawn and the procedure completed. COMPLICATIONS: There were no complications.  ENDOSCOPIC IMPRESSION: 1.   Sessile polyp measuring 1 cm at the cecum; polypectomy performed using snare cautery 2.   Sessile polyp measuring 6 mm in the transverse colon; polypectomy performed with a cold snare 3.   Moderate internal and external hemorrhoids  RECOMMENDATIONS: 1.  Hold aspirin, aspirin products, and anti-inflammatory medication for 2 weeks. 2.  Await pathology results 3.  Repeat Colonoscopy in 3 years with a more extensive bowel prep.  eSigned:  Ladene Artist, MD, [facility] 01/16/2014 8:40 AM

## 2014-01-16 NOTE — Patient Instructions (Signed)

## 2014-01-17 ENCOUNTER — Telehealth: Payer: Self-pay

## 2014-01-17 NOTE — Telephone Encounter (Signed)
  Follow up Call-  Call back number 01/16/2014  Post procedure Call Back phone  # cell - [phone]   Permission to leave phone message Yes     Patient questions:  Do you have a fever, pain , or abdominal swelling? no Pain Score  0 *  Have you tolerated food without any problems? yes  Have you been able to return to your normal activities? yes  Do you have any questions about your discharge instructions: Diet   no Medications  no Follow up visit  no  Do you have questions or concerns about your Care? no  Actions: * If pain score is 4 or above: No action needed, pain <4.  Per the pt she complained of abdominal cramping yesterday.  She also said her arms and legs both hurt yesterday also.  I explained we put a lot of air in her colon during the exam and that could cause abdominal cramping.  I asked if she passed a good amount of gas.  Pt said, "yes".  I advised her to continue to pass gas if any left in her colon.  She said she would.  As for her extrimity pain, I explained we started her IV and she had a blood pressure cuff on her arm, but I did not think the pain was from her colonoscopy.  I advised if any of her sx do not improve to please call us back.  She said she would call if needed. maw

## 2014-01-22 ENCOUNTER — Encounter: Payer: Self-pay | Admitting: Gastroenterology

## 2014-07-11 ENCOUNTER — Other Ambulatory Visit: Payer: Self-pay

## 2014-07-11 DIAGNOSIS — Z1231 Encounter for screening mammogram for malignant neoplasm of breast: Secondary | ICD-10-CM

## 2014-07-19 ENCOUNTER — Ambulatory Visit
Admission: RE | Admit: 2014-07-19 | Discharge: 2014-07-19 | Disposition: A | Discharge status: Home / Self Care | Payer: Medicare Other | Source: Ambulatory Visit

## 2014-07-19 DIAGNOSIS — Z1231 Encounter for screening mammogram for malignant neoplasm of breast: Secondary | ICD-10-CM

## 2014-11-06 ENCOUNTER — Encounter: Payer: Self-pay | Admitting: Cardiology

## 2014-11-06 ENCOUNTER — Ambulatory Visit (INDEPENDENT_AMBULATORY_CARE_PROVIDER_SITE_OTHER): Payer: 59 | Admitting: Cardiology

## 2014-11-06 VITALS — BP 118/80 | HR 73 | Ht 62.0 in | Wt 178.8 lb

## 2014-11-06 DIAGNOSIS — I10 Essential (primary) hypertension: Secondary | ICD-10-CM

## 2014-11-06 DIAGNOSIS — R55 Syncope and collapse: Secondary | ICD-10-CM

## 2014-11-06 DIAGNOSIS — E785 Hyperlipidemia, unspecified: Secondary | ICD-10-CM

## 2014-11-06 DIAGNOSIS — E669 Obesity, unspecified: Secondary | ICD-10-CM

## 2014-11-06 NOTE — Patient Instructions (Signed)
Your physician has recommended you make the following change in your medication...  1. STOP hydrochlorothiazide 2. START coenzyme Q10 100mg  daily for 7 days  >> increase this by 100mg  each week until you get to 300mg  daily  >> take this with crestor  >> if this does not help your crestor side effects, Dr. Ellyn Hack said you can take crestor every other day  Your physician recommends that you schedule a follow-up appointment as needed.

## 2014-11-08 ENCOUNTER — Encounter: Payer: Self-pay | Admitting: Cardiology

## 2014-11-08 NOTE — Assessment & Plan Note (Signed)
The patient understands the need to lose weight with diet and exercise. We have discussed specific strategies for this.  

## 2014-11-08 NOTE — Assessment & Plan Note (Signed)
No active symptoms to suggest recurrent syncopal spells. Stay adequately hydrated, we can stop HCTZ and this may recur more dehydrated. Avoid aggressive blood pressure control. Avoid constipation. Continue with the lower dose of ACE inhibitor.  At this point he is relatively stable upon does not need Routine cardiology followup. She can see [place] on a when necessary basis.

## 2014-11-08 NOTE — Progress Notes (Signed)
PATIENT: Jessica [name] MRN: [id number]  DOB: 28-May-1941   DOV:11/08/2014 PCP: Horatio Pel, MD  Clinic Note: Chief Complaint  Patient presents with  . Annual Exam    c/o having a cold; no complaints  . Loss of Consciousness    Last episode in June 2012   HPI: Jessica [name] is a 74 y.o. female with a PMH below (most notably vasovagal near syncope) who presents today for one-year followup for history of Vasovagal syncope episodes x 3 in June 2012.Marland Kitchen She is a for patient of Dr. Rollene Fare to be off last in July 2013. He states that he saw her in June of 2012 to evaluate 3 near-syncopal episodes with the last one being in May of that year. Dr. Rollene Fare felt these symptoms were likely related to neurocardiogenic vasovagal reactions. She has had several cardiac studies done as part of her evaluations:   Myoview stress test that was negative for ischemia. The patient was able to walk 8:11 minutes reaching 10 METs with no chest pain or ECG changes.   2-D echocardiogram" no significant valvular lesion. No structural heart disease. Normal LV function. He did not place a monitor, as her episodes of near-syncope were buried widely spread.  Interval History: Since 2012, she continues to deny any recurrent recurrent symptoms. She says that the episodes were related to having constipation and straining. She has adjusted her diet such that she no longer has that issue and therefore is not passing out spells. Her ACE inhibitor dose was reduced to 10 mg when I saw her so she is not having more orthostatic dizziness.  She denies any rapid or irregular heartbeats. No syncope or near-syncope. No lightheadedness or dizziness with the exception of occasionally after straining hard bowel movement. She has occasional mild bleeding hemorrhoids, but no real major melena or hematochezia. At no time has she ever had any chest tightness or pressure with rest or exertion. No PND, orthopnea or edema. No TIA  or amaurosis fugax symptoms.  Past Medical History  Diagnosis Date  . Essential hypertension - well controlled 08/31/2013  . Vasovagal near-syncope -rare 08/31/2013  . Dyslipidemia, goal LDL below 160 08/31/2013  . Obesity (BMI 30-39.9) 08/31/2013  . Depression 08/31/2013  . Allergy   . Anemia   . Anxiety   . Asthma     in past, no inhalers now  . Blood transfusion without reported diagnosis   . Cataract     bilateral removed  . GERD (gastroesophageal reflux disease)   . Chronic kidney disease     kidney stones  . Thyroid disease     Allergies  Allergen Reactions  . Codeine Hives  . Penicillins Hives    History   Social History Narrative   Married mother of 3 with one grandchild. Never smoked. Does not drink alcohol.   Walks 3-4 days a week for roughly 20-30 minutes.    ROS: A comprehensive Review of Systems - Essentially negative is not indicated above.  She does have a little cramping she notes when she's been taking her Crestor so she has been reluctant to take it although she does take it.  PHYSICAL EXAM BP 118/80 mmHg  Pulse 73  Ht 5\' 2"  (1.575 m)  Wt 178 lb 12.8 oz (81.103 kg)  BMI 32.69 kg/m2 General appearance: alert, cooperative, appears stated age and no distress Neck: no adenopathy, no carotid bruit, no JVD and supple, symmetrical, trachea midline Lungs: clear to auscultation bilaterally, normal percussion bilaterally and  Nonlabored, good air movement Heart: regular rate and rhythm, S1, S2 normal, soft systolic murmur at the left border, click, rub or gallop and normal apical impulse Abdomen: soft, non-tender; bowel sounds normal; no masses,  no organomegaly Extremities: extremities normal, atraumatic, no cyanosis or edema Pulses: 2+ and symmetric Neurologic: Alert and oriented X 3, normal strength and tone. Normal symmetric reflexes. Normal coordination and gait Cranial nerves: normal  [id number] today: Yes Rate: 73, Rhythm: NSR, normal axis,  borderline incomplete RBBB, T. inversions in 1 and aVL previously documented; short PR interval 144 ms (stable) --> LAFB axis no longer present, otherwise stable / Normal EKG  Recent Labs: None available - lipids monitored by PCP.  ASSESSMENT / PLAN: Vasovagal near-syncope -rare No active symptoms to suggest recurrent syncopal spells. Stay adequately hydrated, we can stop HCTZ and this may recur more dehydrated. Avoid aggressive blood pressure control. Avoid constipation. Continue with the lower dose of ACE inhibitor.  At this point he is relatively stable upon does not need Routine cardiology followup. She can see [place] on a when necessary basis.  Essential hypertension - well controlled With her cramping she can probably stop HCTZ and will use it as needed for swelling. It may just simply increase the likelihood of dehydration and worsening constipation both make her more susceptible to syncope.  Dyslipidemia, goal LDL below 160 On statin -- followed by PCP. I think to try also the cramping and aching to try adding coenzyme Q 10. coenzyme Q10 100mg  daily for 7 days  >> increase this by 100mg  each week until you get to 300mg  daily  >> take this with crestor  >> if this does not help your crestor side effects, --> can take crestor every other day  Obesity (BMI 30-39.9) The patient understands the need to lose weight with diet and exercise. We have discussed specific strategies for this.    Orders Placed This Encounter  Procedures  . EKG 12-Lead    Followup: PRN  Leonie Man, M.D., M.S. Interventional Cardiologist   Pager # [phone]

## 2014-11-08 NOTE — Assessment & Plan Note (Signed)
With her cramping she can probably stop HCTZ and will use it as needed for swelling. It may just simply increase the likelihood of dehydration and worsening constipation both make her more susceptible to syncope.

## 2014-11-08 NOTE — Assessment & Plan Note (Addendum)
On statin -- followed by PCP. I think to try also the cramping and aching to try adding coenzyme Q 10. coenzyme Q10 100mg  daily for 7 days  >> increase this by 100mg  each week until you get to 300mg  daily  >> take this with crestor  >> if this does not help your crestor side effects, --> can take crestor every other day

## 2014-11-19 DIAGNOSIS — E780 Pure hypercholesterolemia: Secondary | ICD-10-CM | POA: Diagnosis not present

## 2014-11-21 DIAGNOSIS — E780 Pure hypercholesterolemia: Secondary | ICD-10-CM | POA: Diagnosis not present

## 2014-11-21 DIAGNOSIS — K219 Gastro-esophageal reflux disease without esophagitis: Secondary | ICD-10-CM | POA: Diagnosis not present

## 2014-11-21 DIAGNOSIS — F329 Major depressive disorder, single episode, unspecified: Secondary | ICD-10-CM | POA: Diagnosis not present

## 2014-11-21 DIAGNOSIS — E039 Hypothyroidism, unspecified: Secondary | ICD-10-CM | POA: Diagnosis not present

## 2014-12-25 DIAGNOSIS — J45909 Unspecified asthma, uncomplicated: Secondary | ICD-10-CM | POA: Diagnosis not present

## 2015-05-22 DIAGNOSIS — E039 Hypothyroidism, unspecified: Secondary | ICD-10-CM | POA: Diagnosis not present

## 2015-05-22 DIAGNOSIS — K219 Gastro-esophageal reflux disease without esophagitis: Secondary | ICD-10-CM | POA: Diagnosis not present

## 2015-05-22 DIAGNOSIS — I10 Essential (primary) hypertension: Secondary | ICD-10-CM | POA: Diagnosis not present

## 2015-05-22 DIAGNOSIS — E780 Pure hypercholesterolemia: Secondary | ICD-10-CM | POA: Diagnosis not present

## 2015-11-19 ENCOUNTER — Other Ambulatory Visit: Payer: Self-pay

## 2015-11-19 DIAGNOSIS — Z1231 Encounter for screening mammogram for malignant neoplasm of breast: Secondary | ICD-10-CM

## 2015-12-05 ENCOUNTER — Ambulatory Visit
Admission: RE | Admit: 2015-12-05 | Discharge: 2015-12-05 | Disposition: A | Discharge status: Home / Self Care | Payer: Medicare Other | Source: Ambulatory Visit

## 2015-12-05 DIAGNOSIS — Z1231 Encounter for screening mammogram for malignant neoplasm of breast: Secondary | ICD-10-CM | POA: Diagnosis not present

## 2015-12-11 ENCOUNTER — Other Ambulatory Visit: Payer: Self-pay | Admitting: Internal Medicine

## 2015-12-11 DIAGNOSIS — R928 Other abnormal and inconclusive findings on diagnostic imaging of breast: Secondary | ICD-10-CM

## 2015-12-16 ENCOUNTER — Ambulatory Visit
Admission: RE | Admit: 2015-12-16 | Discharge: 2015-12-16 | Disposition: A | Discharge status: Home / Self Care | Payer: Medicare Other | Source: Ambulatory Visit | Attending: Internal Medicine | Admitting: Internal Medicine

## 2015-12-16 DIAGNOSIS — N6002 Solitary cyst of left breast: Secondary | ICD-10-CM | POA: Diagnosis not present

## 2015-12-16 DIAGNOSIS — N63 Unspecified lump in breast: Secondary | ICD-10-CM | POA: Diagnosis not present

## 2015-12-16 DIAGNOSIS — R928 Other abnormal and inconclusive findings on diagnostic imaging of breast: Secondary | ICD-10-CM

## 2016-04-21 DIAGNOSIS — H26492 Other secondary cataract, left eye: Secondary | ICD-10-CM | POA: Diagnosis not present

## 2016-04-21 DIAGNOSIS — Z961 Presence of intraocular lens: Secondary | ICD-10-CM | POA: Diagnosis not present

## 2016-04-21 DIAGNOSIS — H43813 Vitreous degeneration, bilateral: Secondary | ICD-10-CM | POA: Diagnosis not present

## 2016-04-21 DIAGNOSIS — H04123 Dry eye syndrome of bilateral lacrimal glands: Secondary | ICD-10-CM | POA: Diagnosis not present

## 2016-05-09 ENCOUNTER — Emergency Department (HOSPITAL_COMMUNITY)
Admission: EM | Admit: 2016-05-09 | Discharge: 2016-05-09 | Disposition: A | Discharge status: Home / Self Care | Payer: No Typology Code available for payment source | Attending: Emergency Medicine | Admitting: Emergency Medicine

## 2016-05-09 ENCOUNTER — Encounter (HOSPITAL_COMMUNITY): Payer: Self-pay | Admitting: Emergency Medicine

## 2016-05-09 DIAGNOSIS — S20219A Contusion of unspecified front wall of thorax, initial encounter: Secondary | ICD-10-CM | POA: Insufficient documentation

## 2016-05-09 DIAGNOSIS — Y92410 Unspecified street and highway as the place of occurrence of the external cause: Secondary | ICD-10-CM | POA: Diagnosis not present

## 2016-05-09 DIAGNOSIS — S299XXA Unspecified injury of thorax, initial encounter: Secondary | ICD-10-CM | POA: Diagnosis present

## 2016-05-09 DIAGNOSIS — Z79899 Other long term (current) drug therapy: Secondary | ICD-10-CM | POA: Diagnosis not present

## 2016-05-09 DIAGNOSIS — J45909 Unspecified asthma, uncomplicated: Secondary | ICD-10-CM | POA: Insufficient documentation

## 2016-05-09 DIAGNOSIS — Y939 Activity, unspecified: Secondary | ICD-10-CM | POA: Insufficient documentation

## 2016-05-09 DIAGNOSIS — S20212A Contusion of left front wall of thorax, initial encounter: Secondary | ICD-10-CM | POA: Diagnosis not present

## 2016-05-09 DIAGNOSIS — I129 Hypertensive chronic kidney disease with stage 1 through stage 4 chronic kidney disease, or unspecified chronic kidney disease: Secondary | ICD-10-CM | POA: Diagnosis not present

## 2016-05-09 DIAGNOSIS — M545 Low back pain: Secondary | ICD-10-CM | POA: Diagnosis not present

## 2016-05-09 DIAGNOSIS — N189 Chronic kidney disease, unspecified: Secondary | ICD-10-CM | POA: Insufficient documentation

## 2016-05-09 DIAGNOSIS — Y999 Unspecified external cause status: Secondary | ICD-10-CM | POA: Diagnosis not present
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
## 2016-08-25 DIAGNOSIS — J069 Acute upper respiratory infection, unspecified: Secondary | ICD-10-CM | POA: Diagnosis not present

## 2016-11-02 DIAGNOSIS — Z9221 Personal history of antineoplastic chemotherapy: Secondary | ICD-10-CM

## 2016-11-02 HISTORY — DX: Personal history of antineoplastic chemotherapy: Z92.21

## 2016-12-02 ENCOUNTER — Encounter: Payer: Self-pay | Admitting: Gastroenterology

## 2017-01-18 ENCOUNTER — Encounter: Payer: Self-pay | Admitting: Gastroenterology

## 2017-01-18 ENCOUNTER — Other Ambulatory Visit: Payer: Self-pay | Admitting: Internal Medicine

## 2017-01-18 DIAGNOSIS — Z1231 Encounter for screening mammogram for malignant neoplasm of breast: Secondary | ICD-10-CM

## 2017-01-31 DIAGNOSIS — C801 Malignant (primary) neoplasm, unspecified: Secondary | ICD-10-CM

## 2017-01-31 DIAGNOSIS — C50919 Malignant neoplasm of unspecified site of unspecified female breast: Secondary | ICD-10-CM

## 2017-01-31 HISTORY — DX: Malignant neoplasm of unspecified site of unspecified female breast: C50.919

## 2017-01-31 HISTORY — DX: Malignant (primary) neoplasm, unspecified: C80.1

## 2017-02-04 ENCOUNTER — Ambulatory Visit
Admission: RE | Admit: 2017-02-04 | Discharge: 2017-02-04 | Disposition: A | Discharge status: Home / Self Care | Payer: Medicare Other | Source: Ambulatory Visit | Attending: Internal Medicine | Admitting: Internal Medicine

## 2017-02-04 DIAGNOSIS — Z1231 Encounter for screening mammogram for malignant neoplasm of breast: Secondary | ICD-10-CM

## 2017-02-05 ENCOUNTER — Other Ambulatory Visit: Payer: Self-pay | Admitting: Internal Medicine

## 2017-02-05 DIAGNOSIS — R928 Other abnormal and inconclusive findings on diagnostic imaging of breast: Secondary | ICD-10-CM

## 2017-02-09 ENCOUNTER — Ambulatory Visit
Admission: RE | Admit: 2017-02-09 | Discharge: 2017-02-09 | Disposition: A | Discharge status: Home / Self Care | Payer: Medicare Other | Source: Ambulatory Visit | Attending: Internal Medicine | Admitting: Internal Medicine

## 2017-02-09 ENCOUNTER — Other Ambulatory Visit: Payer: Self-pay | Admitting: Internal Medicine

## 2017-02-09 DIAGNOSIS — R921 Mammographic calcification found on diagnostic imaging of breast: Secondary | ICD-10-CM

## 2017-02-09 DIAGNOSIS — R928 Other abnormal and inconclusive findings on diagnostic imaging of breast: Secondary | ICD-10-CM

## 2017-02-23 ENCOUNTER — Inpatient Hospital Stay: Admission: RE | Admit: 2017-02-23 | Payer: Medicare Other | Source: Ambulatory Visit

## 2017-02-24 ENCOUNTER — Ambulatory Visit
Admission: RE | Admit: 2017-02-24 | Discharge: 2017-02-24 | Disposition: A | Discharge status: Home / Self Care | Payer: Medicare Other | Source: Ambulatory Visit | Attending: Internal Medicine | Admitting: Internal Medicine

## 2017-02-24 DIAGNOSIS — R921 Mammographic calcification found on diagnostic imaging of breast: Secondary | ICD-10-CM

## 2017-02-24 DIAGNOSIS — C50212 Malignant neoplasm of upper-inner quadrant of left female breast: Secondary | ICD-10-CM | POA: Diagnosis not present

## 2017-02-24 HISTORY — PX: BREAST BIOPSY: SHX20

## 2017-02-25 ENCOUNTER — Other Ambulatory Visit: Payer: Self-pay | Admitting: Internal Medicine

## 2017-02-25 DIAGNOSIS — C50912 Malignant neoplasm of unspecified site of left female breast: Secondary | ICD-10-CM

## 2017-02-26 ENCOUNTER — Ambulatory Visit
Admission: RE | Admit: 2017-02-26 | Discharge: 2017-02-26 | Disposition: A | Discharge status: Home / Self Care | Payer: Medicare Other | Source: Ambulatory Visit | Attending: Internal Medicine | Admitting: Internal Medicine

## 2017-02-26 DIAGNOSIS — C50912 Malignant neoplasm of unspecified site of left female breast: Secondary | ICD-10-CM

## 2017-02-26 DIAGNOSIS — N6489 Other specified disorders of breast: Secondary | ICD-10-CM | POA: Diagnosis not present

## 2017-02-26 HISTORY — DX: Malignant (primary) neoplasm, unspecified: C80.1

## 2017-02-26 HISTORY — DX: Malignant neoplasm of unspecified site of unspecified female breast: C50.919

## 2017-03-02 ENCOUNTER — Ambulatory Visit: Payer: Self-pay | Admitting: Surgery

## 2017-03-02 DIAGNOSIS — C50212 Malignant neoplasm of upper-inner quadrant of left female breast: Secondary | ICD-10-CM

## 2017-03-02 DIAGNOSIS — Z171 Estrogen receptor negative status [ER-]: Secondary | ICD-10-CM

## 2017-03-02 DIAGNOSIS — C50912 Malignant neoplasm of unspecified site of left female breast: Secondary | ICD-10-CM | POA: Diagnosis not present

## 2017-03-02 MED ORDER — DEXTROSE 5 % IV SOLN
600.0000 mg | Freq: Once | INTRAVENOUS | Status: AC
  Administered 2017-03-23: 600 mg via INTRAVENOUS

## 2017-03-02 NOTE — H&P (Signed)
Jessica [name] 03/02/2017 3:03 PM Location: [facility] Patient #: [id number] DOB: 1941-09-20 Married / Language: [place] / Race: White Female  History of Present Illness Maisie Fus A. Jeral Zick MD; 03/02/2017 5:18 PM) Patient words: Patient sent at the request of Dr. Linde Gillis for abnormal mammogram. Patient was noted to have a cluster of left breast microcalcifications upper inner quadrant measuring 1.5 cm there were atypical. Core biopsy was done which showed invasive ductal carcinoma ER negative PR negative HER-2/neu negative with a growth fraction of 70%. Patient has no family history of breast cancer. Patient denies any history of breast pain, nipple discharge or other issue with either breast.                          CLINICAL DATA: Screening. EXAM: 2D DIGITAL SCREENING BILATERAL MAMMOGRAM WITH CAD AND ADJUNCT TOMO COMPARISON: Previous exam(s). ACR Breast Density Category b: There are scattered areas of fibroglandular density. FINDINGS: In the left breast, calcifications warrant further evaluation. In the right breast, no findings suspicious for malignancy. Images were processed with CAD. IMPRESSION: Further evaluation is suggested for calcifications in the left breast. RECOMMENDATION: Diagnostic mammogram of the left breast. (Code:[id number]-57M) The patient will be contacted regarding the findings, and additional imaging will be scheduled. BI-RADS CATEGORY 0: Incomplete. Need additional imaging evaluation and/or prior mammograms for comparison. Electronically Signed By: Gerome Sam III M.D On: 02/04/2017 15:30   MM Digital Diagnostic Unilat L (Order [id number]) - Reflex for Order [id number] Study Result  CLINICAL DATA: Patient returns today to evaluate left breast calcifications identified on a recent screening mammogram. EXAM: DIGITAL DIAGNOSTIC LEFT MAMMOGRAM WITH CAD COMPARISON: Previous exams including recent screening mammogram  dated 02/04/2017. ACR Breast Density Category c: The breast tissue is heterogeneously dense, which may obscure small masses. FINDINGS: Grouped pleomorphic calcifications are confirmed within the upper inner quadrant of the left breast, spanning 1.5 cm. Additional scattered benign-appearing calcifications are seen within the outer left breast. Mammographic images were processed with CAD. IMPRESSION: Suspicious pleomorphic calcifications within the upper inner quadrant of the left breast, spanning 1.5 cm, for which stereotactic biopsy is recommended. RECOMMENDATION: Stereotactic biopsy of the left breast calcifications. Stereotactic biopsy is scheduled for 02/11/2017. I have discussed the findings and recommendations with the patient. Results were also provided in writing at the conclusion of the visit. If applicable, a reminder letter will be sent to the patient regarding the next appointment. BI-RADS CATEGORY 4: Suspicious. Electronically Signed By: Bary Richard M.D. On: 02/09/2017 13:50      for Jessica [name], Jessica [name] ([id number]) Patient: Jessica [name], Jessica [name] Collected: 02/24/2017 Client: [facility] Imaging Accession: [id number] Received: 02/24/2017 A. Olena Heckle, MD DOB: [birth year] Age: 11 Gender: F Reported: 02/25/2017 [address] Patient Ph: [phone] MRN #: [id number] [place], [place] [address] Client Acc#: Chart #: [id number] Phone: [phone] Fax: [phone] CC: [facility] INTERNAL CC cc: Merri Brunette, MD REPORT OF SURGICAL PATHOLOGY ADDITIONAL INFORMATION: PROGNOSTIC INDICATORS Results: IMMUNOHISTOCHEMICAL AND MORPHOMETRIC ANALYSIS PERFORMED MANUALLY Estrogen Receptor: 0%, NEGATIVE Progesterone Receptor: 0%, NEGATIVE Proliferation Marker Ki67: 70% OF NOTE, THERE IS MINIMAL INVASIVE TUMOR PRESENT FOR EVALUATION. COMMENT: The negative hormone receptor study(ies) in this case has An internal positive control. REFERENCE RANGE ESTROGEN  RECEPTOR NEGATIVE 0% POSITIVE =>1% REFERENCE RANGE PROGESTERONE RECEPTOR NEGATIVE 0% POSITIVE =>1% All controls stained appropriately Pecola Leisure MD Pathologist, Electronic Signature ( Signed 03/02/2017) FLUORESCENCE IN-SITU HYBRIDIZATION Results: HER2 - NEGATIVE RATIO OF HER2/CEP17 SIGNALS 1.26 AVERAGE HER2 COPY NUMBER PER CELL  3.66 1 of 3 Duplicate copy FINAL for Jessica [name], Jessica [name] ([id number]) ADDITIONAL INFORMATION:(continued) Reference Range: NEGATIVE HER2/CEP17 Ratio <2.0 and average HER2 copy number <4.0 EQUIVOCAL HER2/CEP17 Ratio <2.0 and average HER2 copy number >=4.0 and <6.0 POSITIVE HER2/CEP17 Ratio >=2.0 or <2.0 and average HER2 copy number >=6.0 This is NOT signed out FINAL DIAGNOSIS Diagnosis Breast, left, needle core biopsy, upper inner quadrant INVASIVE DUCTAL CARCINOMA, GRADE 3 DUCTAL CARCINOMA IN SITU WITH CALCIFICATIONS AND NECROSIS, GRADE 3 Microscopic Comment The breast prognostic profile has been ordered. Casimer Lanius MD Pathologist, Electronic Signature.  The patient is a 76 year old female.   Past Surgical History (Janette Ranson, CMA; 03/02/2017 3:04 PM) Breast Biopsy Left. Cataract Surgery Bilateral. Colon Polyp Removal - Colonoscopy Hemorrhoidectomy Hysterectomy (not due to cancer) - Complete Oral Surgery Shoulder Surgery Left. Tonsillectomy  Diagnostic Studies History (Janette Ranson, CMA; 03/02/2017 3:03 PM) Colonoscopy 1-5 years ago Mammogram within last year Pap Smear >5 years ago  Allergies (Janette Ranson, CMA; 03/02/2017 3:06 PM) Penicillin VK *PENICILLINS* Codeine Phosphate *ANALGESICS - OPIOID* Laryngeal spasm. Ibuprofen *ANALGESICS - ANTI-INFLAMMATORY* Allergies Reconciled  Medication History (Janette Ranson, CMA; 03/02/2017 3:08 PM) Benazepril HCl ('20MG'$  Tablet, Oral) Active. HydroCHLOROthiazide (12.'5MG'$  Capsule, Oral) Active. Levothyroxine Sodium (75MCG Tablet, Oral) Active. Venlafaxine HCl ER ('75MG'$  Capsule  ER 24HR, Oral) Active. Caltrate 600 + D (600-'125MG'$ -IU Tablet, Oral) Active. L-Lysine ('1000MG'$  Tablet, Oral) Active. Omeprazole ('20MG'$  Tablet DR, Oral) Active. Multivitamin Adult (Oral) Active. Medications Reconciled  Social History (Janette Ranson, CMA; 03/02/2017 3:04 PM) Caffeine use Carbonated beverages, Coffee, Tea. No alcohol use No drug use Tobacco use Never smoker.  Family History (Janette Ranson, CMA; 03/02/2017 3:04 PM) Arthritis Mother. Diabetes Mellitus Mother. Heart Disease Brother, Father, Mother. Heart disease in female family member before age 41 Heart disease in female family member before age 59 Prostate Cancer Brother. Thyroid problems Mother.  Pregnancy / Birth History (Janette Ranson, CMA; 03/02/2017 3:04 PM) Age at menarche 41 years. Age of menopause >60 Contraceptive History Oral contraceptives. Gravida 3 Maternal age 75-25 Para 3 Regular periods  Other Problems (Janette Ranson, CMA; 03/02/2017 3:04 PM) Asthma Breast Cancer Depression Gastroesophageal Reflux Disease Hemorrhoids High blood pressure Hypercholesterolemia Kidney Stone Lump In Breast Thyroid Disease     Review of Systems (Janette Ranson CMA; 03/02/2017 3:04 PM) General Present- Night Sweats. Not Present- Appetite Loss, Chills, Fatigue, Fever, Weight Gain and Weight Loss. Skin Not Present- Change in Wart/Mole, Dryness, Hives, Jaundice, New Lesions, Non-Healing Wounds, Rash and Ulcer. HEENT Present- Oral Ulcers, Seasonal Allergies and Wears glasses/contact lenses. Not Present- Earache, Hearing Loss, Hoarseness, Nose Bleed, Ringing in the Ears, Sinus Pain, Sore Throat, Visual Disturbances and Yellow Eyes. Respiratory Present- Snoring. Not Present- Bloody sputum, Chronic Cough, Difficulty Breathing and Wheezing. Breast Present- Breast Mass. Not Present- Breast Pain, Nipple Discharge and Skin Changes. Cardiovascular Not Present- Chest Pain, Difficulty Breathing Lying  Down, Leg Cramps, Palpitations, Rapid Heart Rate, Shortness of Breath and Swelling of Extremities. Gastrointestinal Present- Constipation, Hemorrhoids, Indigestion and Rectal Pain. Not Present- Abdominal Pain, Bloating, Bloody Stool, Change in Bowel Habits, Chronic diarrhea, Difficulty Swallowing, Excessive gas, Gets full quickly at meals, Nausea and Vomiting. Female Genitourinary Not Present- Frequency, Nocturia, Painful Urination, Pelvic Pain and Urgency. Musculoskeletal Not Present- Back Pain, Joint Pain, Joint Stiffness, Muscle Pain, Muscle Weakness and Swelling of Extremities. Neurological Not Present- Decreased Memory, Fainting, Headaches, Numbness, Seizures, Tingling, Tremor, Trouble walking and Weakness. Psychiatric Present- Depression. Not Present- Anxiety, Bipolar, Change in Sleep Pattern, Fearful and Frequent crying. Endocrine Present- Hot flashes. Not  Present- Cold Intolerance, Excessive Hunger, Hair Changes, Heat Intolerance and New Diabetes. Hematology Not Present- Blood Thinners, Easy Bruising, Excessive bleeding, Gland problems, HIV and Persistent Infections.  Vitals (Janette Ranson CMA; 03/02/2017 3:08 PM) 03/02/2017 3:08 PM Weight: 174 lb Height: 62in Body Surface Area: 1.8 m Body Mass Index: 31.82 kg/m  Temp.: 98.45F  Pulse: 76 (Regular)  BP: 130/80 (Sitting, Left Arm, Standard)      Physical Exam (Satrina Magallanes A. Carol Theys MD; 03/02/2017 5:18 PM)  General Mental Status-Alert. General Appearance-Consistent with stated age. Hydration-Well hydrated. Voice-Normal.  Head and Neck Head-normocephalic, atraumatic with no lesions or palpable masses. Trachea-midline. Thyroid Gland Characteristics - normal size and consistency.  Eye Eyeball - Bilateral-Extraocular movements intact. Sclera/Conjunctiva - Bilateral-No scleral icterus.  Chest and Lung Exam Chest and lung exam reveals -quiet, even and easy respiratory effort with no use of accessory  muscles and on auscultation, normal breath sounds, no adventitious sounds and normal vocal resonance. Inspection Chest Wall - Normal. Back - normal.  Breast Note: Left breast shows bruising left upper quadrant. No discrete mass. Left nipple is normal. Right breast is normal. No evidence of nipple discharge. No masses.  Cardiovascular Cardiovascular examination reveals -normal heart sounds, regular rate and rhythm with no murmurs and normal pedal pulses bilaterally.  Neurologic Neurologic evaluation reveals -alert and oriented x 3 with no impairment of recent or remote memory. Mental Status-Normal.  Musculoskeletal Normal Exam - Left-Upper Extremity Strength Normal and Lower Extremity Strength Normal. Normal Exam - Right-Upper Extremity Strength Normal and Lower Extremity Strength Normal.  Lymphatic Head & Neck  General Head & Neck Lymphatics: Bilateral - Description - Normal. Axillary  General Axillary Region: Bilateral - Description - Normal. Tenderness - Non Tender.    Assessment & Plan (Gresia Isidoro A. Marlow Hendrie MD; 03/02/2017 5:18 PM)  BREAST CANCER, LEFT (C50.912) Impression: Triple negative left breast cancer. She is a good lumpectomy candidate of explaining the procedure to her as well as the pros and cons of breast conservation versus mastectomy and reconstruction. I will refer her to medical and radiation oncology. Risk of lumpectomy include bleeding, infection, seroma, more surgery, use of seed/wire, wound care, cosmetic deformity and the need for other treatments, death , blood clots, death. Pt agrees to proceed. Risk of sentinel lymph node mapping include bleeding, infection, lymphedema, shoulder pain. stiffness, dye allergy. cosmetic deformity , blood clots, death, need for more surgery. Pt agres to proceed.  Current Plans Referred to Oncology, for evaluation and follow up (Oncology). Routine. Referred to Radiation Oncology, for evaluation and follow up (Radiation  Oncology). Routine. Referred to Physical Therapy, for evaluation and follow up (Physical Therapy). Routine. You are being scheduled for surgery- Our schedulers will call you.  You should hear from our office's scheduling department within 5 working days about the location, date, and time of surgery. We try to make accommodations for patient's preferences in scheduling surgery, but sometimes the OR schedule or the surgeon's schedule prevents [place] from making those accommodations.  If you have not heard from our office [phone]) in 5 working days, call the office and ask for your surgeon's nurse.  If you have other questions about your diagnosis, plan, or surgery, call the office and ask for your surgeon's nurse.  Pt Education - [facility] Breast Cancer Information Given - [facility] "Breast Journey" Package We discussed the staging and pathophysiology of breast cancer. We discussed all of the different options for treatment for breast cancer including surgery, chemotherapy, radiation therapy, Herceptin, and antiestrogen therapy. We discussed a  sentinel lymph node biopsy as she does not appear to having lymph node involvement right now. We discussed the performance of that with injection of radioactive tracer and blue dye. We discussed that she would have an incision underneath her axillary hairline. We discussed that there is a bout a 10-20% chance of having a positive node with a sentinel lymph node biopsy and we will await the permanent pathology to make any other first further decisions in terms of her treatment. One of these options might be to return to the operating room to perform an axillary lymph node dissection. We discussed about a 1-2% risk lifetime of chronic shoulder pain as well as lymphedema associated with a sentinel lymph node biopsy. We discussed the options for treatment of the breast cancer which included lumpectomy versus a mastectomy. We discussed the performance of the lumpectomy  with a wire placement. We discussed a 10-20% chance of a positive margin requiring reexcision in the operating room. We also discussed that she may need radiation therapy or antiestrogen therapy or both if she undergoes lumpectomy. We discussed the mastectomy and the postoperative care for that as well. We discussed that there is no difference in her survival whether she undergoes lumpectomy with radiation therapy or antiestrogen therapy versus a mastectomy. There is a slight difference in the local recurrence rate being 3-5% with lumpectomy and about 1% with a mastectomy. We discussed the risks of operation including bleeding, infection, possible reoperation. She understands her further therapy will be based on what her stages at the time of her operation.  Pt Education - [facility] breast cancer surgery: discussed with patient and provided information. Pt Education - [facility] Breast Biopsy HCI: discussed with patient and provided information. Pt Education - ABC (After Breast Cancer) Class Info: discussed with patient and provided information.

## 2017-03-03 ENCOUNTER — Ambulatory Visit: Payer: Self-pay | Admitting: Surgery

## 2017-03-05 ENCOUNTER — Encounter: Payer: Self-pay | Admitting: Radiation Oncology

## 2017-03-05 ENCOUNTER — Other Ambulatory Visit: Payer: Self-pay | Admitting: Surgery

## 2017-03-05 ENCOUNTER — Encounter: Payer: Self-pay | Admitting: Hematology & Oncology

## 2017-03-05 DIAGNOSIS — Z171 Estrogen receptor negative status [ER-]: Secondary | ICD-10-CM

## 2017-03-05 DIAGNOSIS — C50212 Malignant neoplasm of upper-inner quadrant of left female breast: Secondary | ICD-10-CM

## 2017-03-08 ENCOUNTER — Other Ambulatory Visit (HOSPITAL_BASED_OUTPATIENT_CLINIC_OR_DEPARTMENT_OTHER): Payer: Medicare Other

## 2017-03-08 ENCOUNTER — Encounter: Payer: Self-pay | Admitting: Hematology & Oncology

## 2017-03-08 ENCOUNTER — Ambulatory Visit: Payer: Medicare Other

## 2017-03-08 ENCOUNTER — Encounter: Payer: Self-pay | Admitting: Radiation Oncology

## 2017-03-08 ENCOUNTER — Ambulatory Visit (HOSPITAL_BASED_OUTPATIENT_CLINIC_OR_DEPARTMENT_OTHER): Payer: Medicare Other | Admitting: Hematology & Oncology

## 2017-03-08 DIAGNOSIS — C50212 Malignant neoplasm of upper-inner quadrant of left female breast: Secondary | ICD-10-CM

## 2017-03-08 DIAGNOSIS — Z171 Estrogen receptor negative status [ER-]: Secondary | ICD-10-CM

## 2017-03-08 DIAGNOSIS — Z7189 Other specified counseling: Secondary | ICD-10-CM

## 2017-03-11 ENCOUNTER — Encounter: Payer: Self-pay | Admitting: Hematology & Oncology

## 2017-03-11 DIAGNOSIS — Z7189 Other specified counseling: Secondary | ICD-10-CM

## 2017-03-11 DIAGNOSIS — C50212 Malignant neoplasm of upper-inner quadrant of left female breast: Secondary | ICD-10-CM | POA: Insufficient documentation

## 2017-03-11 HISTORY — DX: Other specified counseling: Z71.89

## 2017-03-11 HISTORY — DX: Malignant neoplasm of upper-inner quadrant of left female breast: C50.212

## 2017-03-11 NOTE — Progress Notes (Signed)
Referral MD  Reason for Referral: Clinical stage I infiltrating ductal carcinoma of the left breast-triple negative   Chief Complaint  Patient presents with  . New Patient (Initial Visit)  : I have breast cancer.  HPI: Ms. Hornstein is a very nice 76 year old white female. She has been in good health. I actually have known her for many years. 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  She has not been on estrogen therapy. She is over 20 years into the menopause. She has 3 children. Her first child was born when she was 62 years old.  She has been healthy. She has been active. She had a routine mammogram. This was in early April. This showed some calcifications in the left breast. She then had a diagnostic mammogram. This shows suspicious calcifications in the upper inner quadrant of the left breast. They measured 1.5 cm.  She then had a stereotactic biopsy. This was done on April 25. The pathology report ([id number]) showed a grade 3 infiltrating ductal carcinoma. Shockingly, it was triple negative. It had a high proliferation index of 70%.  She has seen Dr. Brantley Stage of general surgery. He will do a lumpectomy on May 22.  She has had no pain. His been no breast swelling. She could not palpate any lump herself. She's had no cough or shortness of breath. She's had no nausea or vomiting.  There's been no change in bowel or bladder habits.  There is no family history of breast cancer.  Overall, her performance status is ECOG 0.    Past Medical History:  Diagnosis Date  . Allergy   . Anemia   . Anxiety   . Asthma    in past, no inhalers now  . Blood transfusion without reported diagnosis   . Breast cancer ([place]) 01/2017   left breast   . Cancer ([place]) 01/2017   left breast  . Cataract    bilateral removed  . Chronic kidney disease    kidney stones  . Depression 08/31/2013  . Dyslipidemia, goal LDL below 160 08/31/2013  . Essential hypertension - well controlled  08/31/2013  . GERD (gastroesophageal reflux disease)   . Obesity (BMI 30-39.9) 08/31/2013  . Thyroid disease   . Vasovagal near-syncope -rare 08/31/2013  :  Past Surgical History:  Procedure Laterality Date  . ABDOMINAL HYSTERECTOMY     total  . BREAST BIOPSY Left 01/2017    malignant  . broken fingers    . COLONOSCOPY    . EXCISIONAL HEMORRHOIDECTOMY    . kidney stones lithotripsy    . ROTATOR CUFF REPAIR     left   . TONSILLECTOMY    . WISDOM TOOTH EXTRACTION    :   Current Outpatient Prescriptions:  .  Cold Sore Products (L-LYSINE) OINT, Apply topically., Disp: , Rfl:  .  benazepril (LOTENSIN) 20 MG tablet, Take 20 mg by mouth daily., Disp: , Rfl:  .  calcium citrate-vitamin D (CITRACAL+D) 315-200 MG-UNIT per tablet, Take 1 tablet by mouth daily., Disp: , Rfl:  .  diphenhydramine-acetaminophen (TYLENOL PM) 25-500 MG TABS, Take 1 tablet by mouth at bedtime as needed., Disp: , Rfl:  .  hydrochlorothiazide (MICROZIDE) 12.5 MG capsule, Take 12.5 mg by mouth daily., Disp: , Rfl: 1 .  levothyroxine (SYNTHROID, LEVOTHROID) 75 MCG tablet, Take 75 mcg by mouth daily., Disp: , Rfl:  .  Multiple Vitamin (MULTIVITAMIN) tablet, Take 1 tablet by mouth daily., Disp: , Rfl:  .  omeprazole (PRILOSEC OTC)  20 MG tablet, Take 20 mg by mouth daily., Disp: , Rfl:  .  venlafaxine XR (EFFEXOR-XR) 75 MG 24 hr capsule, Take 225 mg by mouth daily., Disp: , Rfl:  No current facility-administered medications for this visit.   Facility-Administered Medications Ordered in Other Visits:  .  clindamycin (CLEOCIN) 600 mg in dextrose 5 % 50 mL IVPB, 600 mg, Intravenous, Once, Cornett, Thomas, MD:  :  Allergies  Allergen Reactions  . Codeine Hives  . Penicillins Hives  . Advil [Ibuprofen] Hives and Itching  :  Family History  Problem Relation Age of Onset  . Colon cancer Neg Hx   . Pancreatic cancer Neg Hx   . Stomach cancer Neg Hx   . Esophageal cancer Neg Hx   . Rectal cancer Neg Hx    :  Social History   Social History  . Marital status: Married    Spouse name: N/A  . Number of children: N/A  . Years of education: N/A   Occupational History  . Not on file.   Social History Main Topics  . Smoking status: Never Smoker  . Smokeless tobacco: Never Used  . Alcohol use No  . Drug use: No  . Sexual activity: Not on file   Other Topics Concern  . Not on file   Social History Narrative   Married mother of 3 with one grandchild. Never smoked. Does not drink alcohol.   Walks 3-4 days a week for roughly 20-30 minutes.  :  Pertinent items are noted in HPI.  Exam:Well-developed and well-nourished white female in no obvious distress. Vital signs show a temperature of 98.8. Pulse 87. Blood pressure 143/71. Head and neck exam shows no ocular or oral lesions. There are no palpable cervical or supraclavicular lymph nodes. Lungs are clear. Cardiac exam regular rate and rhythm with no murmurs, rubs or bruits. Breast exam shows right breast with no masses, edema or erythema. There is no right axillary adenopathy. Left pressure shows the biopsy site to be healed. This is at the 11:00 position. There is a low bit of ecchymosis at the site area and no swelling is noted. There is no erythema. There is no left axillary adenopathy. Abdomen is soft. She has good bowel sounds. There is no palpable liver or spleen tip. Back exam shows no tenderness over the spine, ribs or hips. Extremities shows no clubbing, cyanosis or edema. Neurological exam shows no focal neurological deficits. Skin exam shows no rashes, ecchymoses or petechia.  No results for input(s): WBC, HGB, HCT, PLT in the last 72 hours. No results for input(s): NA, K, CL, CO2, GLUCOSE, BUN, CREATININE, CALCIUM in the last 72 hours.  Blood smear review:  None  Pathology: See above     Assessment and Plan:  Ms. Mednick is a 76 year old postmenopausal female with a triple negative  infiltrating ductal carcinoma of the LEFT  breast.  She is in good health. As such, she will clearly be a candidate for adjuvant chemotherapy.  It is obvious that she has an aggressive breast cancer. It is very unusual to see triple negative disease and a woman who is this far past the menopause. The proliferation index being so high also is somewhat concerning.  Given that she has triple negative disease, I think she would benefit from Taxotere/carboplatinum. I think 4 cycles would be appropriate. She might need 6 cycles if she has a lot of lymph nodes involved.  I talked to she and her daughter at length. I  explained to them why I thought that she would need chemotherapy. They understand.  I have known Rubee for quite a while. She is a Copywriter, advertising.  She will need to have a Port-A-Cath placed. I will notify Dr. Brantley Stage to see if he can do this during her surgery.  We discussed goals of care. Basically, the goals of care is cure. I think she'll have the best chance of this with adjuvant chemotherapy and radiation therapy.  She will need to have radiation therapy. We will set her up for this after she has her chemotherapy   I spent about an hour which she and her daughter. It was nice to talk with him. I answered all their questions.  I will like to see her back about 2 weeks after surgery and at that point, we will then plan to start the process of getting her treatment instituted.

## 2017-03-16 ENCOUNTER — Encounter (HOSPITAL_BASED_OUTPATIENT_CLINIC_OR_DEPARTMENT_OTHER): Payer: Self-pay | Admitting: *Deleted

## 2017-03-16 ENCOUNTER — Encounter: Payer: Medicare Other | Admitting: Gastroenterology

## 2017-03-18 ENCOUNTER — Encounter (HOSPITAL_BASED_OUTPATIENT_CLINIC_OR_DEPARTMENT_OTHER)
Admission: RE | Admit: 2017-03-18 | Discharge: 2017-03-18 | Disposition: A | Discharge status: Home / Self Care | Payer: Medicare Other | Source: Ambulatory Visit | Attending: Surgery | Admitting: Surgery

## 2017-03-18 DIAGNOSIS — Z01812 Encounter for preprocedural laboratory examination: Secondary | ICD-10-CM | POA: Diagnosis not present

## 2017-03-18 DIAGNOSIS — I4510 Unspecified right bundle-branch block: Secondary | ICD-10-CM | POA: Insufficient documentation

## 2017-03-18 DIAGNOSIS — Z01810 Encounter for preprocedural cardiovascular examination: Secondary | ICD-10-CM | POA: Insufficient documentation

## 2017-03-18 DIAGNOSIS — C50912 Malignant neoplasm of unspecified site of left female breast: Secondary | ICD-10-CM | POA: Insufficient documentation

## 2017-03-18 DIAGNOSIS — Z171 Estrogen receptor negative status [ER-]: Secondary | ICD-10-CM | POA: Diagnosis not present

## 2017-03-18 DIAGNOSIS — I10 Essential (primary) hypertension: Secondary | ICD-10-CM | POA: Diagnosis not present

## 2017-03-18 LAB — CBC WITH DIFFERENTIAL/PLATELET
Basophils Absolute: 0 10*3/uL (ref 0.0–0.1)
Basophils Relative: 0 %
Eosinophils Absolute: 0.2 10*3/uL (ref 0.0–0.7)
Eosinophils Relative: 3 %
HCT: 36.9 % (ref 36.0–46.0)
Hemoglobin: 12 g/dL (ref 12.0–15.0)
Lymphocytes Relative: 32 %
Lymphs Abs: 1.5 10*3/uL (ref 0.7–4.0)
MCH: 31.9 pg (ref 26.0–34.0)
MCHC: 32.5 g/dL (ref 30.0–36.0)
MCV: 98.1 fL (ref 78.0–100.0)
Monocytes Absolute: 0.3 10*3/uL (ref 0.1–1.0)
Monocytes Relative: 6 %
Neutro Abs: 2.7 10*3/uL (ref 1.7–7.7)
Neutrophils Relative %: 59 %
Platelets: 257 10*3/uL (ref 150–400)
RBC: 3.76 MIL/uL — ABNORMAL LOW (ref 3.87–5.11)
RDW: 13.3 % (ref 11.5–15.5)
WBC: 4.6 10*3/uL (ref 4.0–10.5)

## 2017-03-18 LAB — COMPREHENSIVE METABOLIC PANEL
ALT: 17 U/L (ref 14–54)
AST: 25 U/L (ref 15–41)
Albumin: 3.8 g/dL (ref 3.5–5.0)
Alkaline Phosphatase: 56 U/L (ref 38–126)
Anion gap: 9 (ref 5–15)
BUN: 11 mg/dL (ref 6–20)
CO2: 27 mmol/L (ref 22–32)
Calcium: 9.3 mg/dL (ref 8.9–10.3)
Chloride: 103 mmol/L (ref 101–111)
Creatinine, Ser: 0.77 mg/dL (ref 0.44–1.00)
GFR calc Af Amer: >60 mL/min (ref >60)
GFR calc non Af Amer: >60 mL/min (ref >60)
Glucose, Bld: 107 mg/dL — ABNORMAL HIGH (ref 65–99)
Potassium: 3.9 mmol/L (ref 3.5–5.1)
Sodium: 139 mmol/L (ref 135–145)
Total Bilirubin: 0.3 mg/dL (ref 0.3–1.2)
Total Protein: 6.8 g/dL (ref 6.5–8.1)

## 2017-03-18 NOTE — Progress Notes (Signed)
EKG reviewed by Dr. Gifford Shave, ok to proceed with surgery as scheduled, boost drink given with instructions to complete by [id number] dos, hibliclens soap given with instructions, pt verbalized understanding.

## 2017-03-22 ENCOUNTER — Ambulatory Visit
Admission: RE | Admit: 2017-03-22 | Discharge: 2017-03-22 | Disposition: A | Discharge status: Home / Self Care | Payer: Medicare Other | Source: Ambulatory Visit | Attending: Surgery | Admitting: Surgery

## 2017-03-22 DIAGNOSIS — Z171 Estrogen receptor negative status [ER-]: Secondary | ICD-10-CM

## 2017-03-22 DIAGNOSIS — C50212 Malignant neoplasm of upper-inner quadrant of left female breast: Secondary | ICD-10-CM | POA: Diagnosis not present

## 2017-03-23 ENCOUNTER — Encounter (HOSPITAL_COMMUNITY)
Admission: RE | Admit: 2017-03-23 | Discharge: 2017-03-23 | Disposition: A | Discharge status: Home / Self Care | Payer: Medicare Other | Source: Ambulatory Visit | Attending: Surgery | Admitting: Surgery

## 2017-03-23 ENCOUNTER — Ambulatory Visit
Admission: RE | Admit: 2017-03-23 | Discharge: 2017-03-23 | Disposition: A | Discharge status: Home / Self Care | Payer: Medicare Other | Source: Ambulatory Visit | Attending: Surgery | Admitting: Surgery

## 2017-03-23 ENCOUNTER — Ambulatory Visit (HOSPITAL_COMMUNITY): Payer: Medicare Other

## 2017-03-23 ENCOUNTER — Ambulatory Visit (HOSPITAL_BASED_OUTPATIENT_CLINIC_OR_DEPARTMENT_OTHER)
Admission: RE | Admit: 2017-03-23 | Discharge: 2017-03-23 | Disposition: A | Discharge status: Home / Self Care | Payer: Medicare Other | Source: Ambulatory Visit | Attending: Surgery | Admitting: Surgery

## 2017-03-23 ENCOUNTER — Encounter (HOSPITAL_BASED_OUTPATIENT_CLINIC_OR_DEPARTMENT_OTHER): Payer: Self-pay | Admitting: Certified Registered"

## 2017-03-23 ENCOUNTER — Ambulatory Visit (HOSPITAL_BASED_OUTPATIENT_CLINIC_OR_DEPARTMENT_OTHER): Payer: Medicare Other | Admitting: Certified Registered"

## 2017-03-23 ENCOUNTER — Encounter (HOSPITAL_BASED_OUTPATIENT_CLINIC_OR_DEPARTMENT_OTHER)
Admission: RE | Disposition: A | Discharge status: Home / Self Care | Payer: Self-pay | Source: Ambulatory Visit | Attending: Surgery

## 2017-03-23 DIAGNOSIS — Z171 Estrogen receptor negative status [ER-]: Secondary | ICD-10-CM | POA: Diagnosis not present

## 2017-03-23 DIAGNOSIS — K219 Gastro-esophageal reflux disease without esophagitis: Secondary | ICD-10-CM | POA: Insufficient documentation

## 2017-03-23 DIAGNOSIS — E7800 Pure hypercholesterolemia, unspecified: Secondary | ICD-10-CM | POA: Insufficient documentation

## 2017-03-23 DIAGNOSIS — J45909 Unspecified asthma, uncomplicated: Secondary | ICD-10-CM | POA: Diagnosis not present

## 2017-03-23 DIAGNOSIS — C50919 Malignant neoplasm of unspecified site of unspecified female breast: Secondary | ICD-10-CM

## 2017-03-23 DIAGNOSIS — Z880 Allergy status to penicillin: Secondary | ICD-10-CM | POA: Diagnosis not present

## 2017-03-23 DIAGNOSIS — Z452 Encounter for adjustment and management of vascular access device: Secondary | ICD-10-CM | POA: Diagnosis not present

## 2017-03-23 DIAGNOSIS — C50912 Malignant neoplasm of unspecified site of left female breast: Secondary | ICD-10-CM | POA: Diagnosis not present

## 2017-03-23 DIAGNOSIS — C50212 Malignant neoplasm of upper-inner quadrant of left female breast: Secondary | ICD-10-CM

## 2017-03-23 DIAGNOSIS — F329 Major depressive disorder, single episode, unspecified: Secondary | ICD-10-CM | POA: Diagnosis not present

## 2017-03-23 DIAGNOSIS — Z95828 Presence of other vascular implants and grafts: Secondary | ICD-10-CM

## 2017-03-23 DIAGNOSIS — Z87442 Personal history of urinary calculi: Secondary | ICD-10-CM | POA: Diagnosis not present

## 2017-03-23 DIAGNOSIS — I10 Essential (primary) hypertension: Secondary | ICD-10-CM | POA: Insufficient documentation

## 2017-03-23 DIAGNOSIS — C50412 Malignant neoplasm of upper-outer quadrant of left female breast: Secondary | ICD-10-CM | POA: Insufficient documentation

## 2017-03-23 DIAGNOSIS — Z79899 Other long term (current) drug therapy: Secondary | ICD-10-CM | POA: Diagnosis not present

## 2017-03-23 DIAGNOSIS — E785 Hyperlipidemia, unspecified: Secondary | ICD-10-CM | POA: Diagnosis not present

## 2017-03-23 DIAGNOSIS — R928 Other abnormal and inconclusive findings on diagnostic imaging of breast: Secondary | ICD-10-CM | POA: Diagnosis not present

## 2017-03-23 HISTORY — PX: RADIOACTIVE SEED GUIDED PARTIAL MASTECTOMY WITH AXILLARY SENTINEL LYMPH NODE BIOPSY: SHX6520

## 2017-03-23 HISTORY — PX: BREAST LUMPECTOMY: SHX2

## 2017-03-23 HISTORY — DX: Hypothyroidism, unspecified: E03.9

## 2017-03-23 HISTORY — PX: PORTACATH PLACEMENT: SHX2246

## 2017-03-23 SURGERY — RADIOACTIVE SEED GUIDED PARTIAL MASTECTOMY WITH AXILLARY SENTINEL LYMPH NODE BIOPSY
Anesthesia: General | Site: Chest | Laterality: Right

## 2017-03-23 MED ORDER — HEPARIN SOD (PORK) LOCK FLUSH 100 UNIT/ML IV SOLN
INTRAVENOUS | Status: DC | PRN
  Administered 2017-03-23: 500 [IU]

## 2017-03-23 MED ORDER — LACTATED RINGERS IV SOLN
INTRAVENOUS | Status: DC
  Administered 2017-03-23: 12:00:00 via INTRAVENOUS
  Administered 2017-03-23: 10 mL/h via INTRAVENOUS

## 2017-03-23 MED ORDER — HEPARIN (PORCINE) IN NACL 2-0.9 UNIT/ML-% IJ SOLN
INTRAMUSCULAR | Status: AC | PRN
  Administered 2017-03-23: 1 via INTRAVENOUS

## 2017-03-23 MED ORDER — PROPOFOL 500 MG/50ML IV EMUL
INTRAVENOUS | Status: AC
  Filled 2017-03-23: qty 50

## 2017-03-23 MED ORDER — CELECOXIB 400 MG PO CAPS
400.0000 mg | ORAL_CAPSULE | ORAL | Status: DC
Start: 2017-03-23 — End: 2017-03-23

## 2017-03-23 MED ORDER — PROPOFOL 10 MG/ML IV BOLUS
INTRAVENOUS | Status: DC | PRN
  Administered 2017-03-23: 120 mg via INTRAVENOUS

## 2017-03-23 MED ORDER — CHLORHEXIDINE GLUCONATE CLOTH 2 % EX PADS: 6.0000 | MEDICATED_PAD | Freq: Once | CUTANEOUS | Status: DC

## 2017-03-23 MED ORDER — OXYCODONE HCL 5 MG PO TABS: 5.0000 mg | ORAL_TABLET | Freq: Four times a day (QID) | ORAL | 0 refills | Status: DC | PRN

## 2017-03-23 MED ORDER — FENTANYL CITRATE (PF) 100 MCG/2ML IJ SOLN
INTRAMUSCULAR | Status: AC
  Filled 2017-03-23: qty 2

## 2017-03-23 MED ORDER — HEPARIN (PORCINE) IN NACL 2-0.9 UNIT/ML-% IJ SOLN
INTRAMUSCULAR | Status: AC
  Filled 2017-03-23: qty 500

## 2017-03-23 MED ORDER — MEPERIDINE HCL 25 MG/ML IJ SOLN: 6.2500 mg | INTRAMUSCULAR | Status: DC | PRN

## 2017-03-23 MED ORDER — OXYCODONE HCL 5 MG PO TABS
ORAL_TABLET | ORAL | Status: AC
  Filled 2017-03-23: qty 1

## 2017-03-23 MED ORDER — EPHEDRINE SULFATE 50 MG/ML IJ SOLN
INTRAMUSCULAR | Status: DC | PRN
  Administered 2017-03-23 (×2): 10 mg via INTRAVENOUS

## 2017-03-23 MED ORDER — GABAPENTIN 300 MG PO CAPS
ORAL_CAPSULE | ORAL | Status: AC
  Filled 2017-03-23: qty 1

## 2017-03-23 MED ORDER — EPHEDRINE 5 MG/ML INJ
INTRAVENOUS | Status: AC
  Filled 2017-03-23: qty 30

## 2017-03-23 MED ORDER — ONDANSETRON HCL 4 MG/2ML IJ SOLN
INTRAMUSCULAR | Status: AC
  Filled 2017-03-23: qty 10

## 2017-03-23 MED ORDER — CLINDAMYCIN PHOSPHATE 600 MG/50ML IV SOLN
INTRAVENOUS | Status: AC
  Filled 2017-03-23: qty 50

## 2017-03-23 MED ORDER — GABAPENTIN 300 MG PO CAPS
300.0000 mg | ORAL_CAPSULE | ORAL | Status: AC
  Administered 2017-03-23: 300 mg via ORAL

## 2017-03-23 MED ORDER — ONDANSETRON HCL 4 MG/2ML IJ SOLN
INTRAMUSCULAR | Status: DC | PRN
  Administered 2017-03-23: 4 mg via INTRAVENOUS

## 2017-03-23 MED ORDER — HYDROMORPHONE HCL 1 MG/ML IJ SOLN
0.2500 mg | INTRAMUSCULAR | Status: DC | PRN
  Administered 2017-03-23: 0.25 mg via INTRAVENOUS

## 2017-03-23 MED ORDER — CELECOXIB 100 MG PO CAPS: 100.0000 mg | ORAL_CAPSULE | Freq: Two times a day (BID) | ORAL | 0 refills | Status: DC

## 2017-03-23 MED ORDER — LIDOCAINE HCL (CARDIAC) 20 MG/ML IV SOLN
INTRAVENOUS | Status: DC | PRN
  Administered 2017-03-23: 60 mg via INTRAVENOUS

## 2017-03-23 MED ORDER — LIDOCAINE 2% (20 MG/ML) 5 ML SYRINGE
INTRAMUSCULAR | Status: AC
  Filled 2017-03-23: qty 20

## 2017-03-23 MED ORDER — TECHNETIUM TC 99M SULFUR COLLOID FILTERED
1.0000 | Freq: Once | INTRAVENOUS | Status: AC | PRN
  Administered 2017-03-23: 1 via INTRADERMAL

## 2017-03-23 MED ORDER — BUPIVACAINE HCL (PF) 0.25 % IJ SOLN
INTRAMUSCULAR | Status: AC
  Filled 2017-03-23: qty 30

## 2017-03-23 MED ORDER — BUPIVACAINE HCL (PF) 0.25 % IJ SOLN
INTRAMUSCULAR | Status: DC | PRN
  Administered 2017-03-23: 30 mL

## 2017-03-23 MED ORDER — OXYCODONE HCL 5 MG PO TABS: 5.0000 mg | ORAL_TABLET | Freq: Once | ORAL | Status: DC

## 2017-03-23 MED ORDER — CELECOXIB 200 MG PO CAPS
ORAL_CAPSULE | ORAL | Status: AC
  Filled 2017-03-23: qty 2

## 2017-03-23 MED ORDER — METHYLENE BLUE 0.5 % INJ SOLN
INTRAVENOUS | Status: AC
  Filled 2017-03-23: qty 10

## 2017-03-23 MED ORDER — ACETAMINOPHEN 500 MG PO TABS
1000.0000 mg | ORAL_TABLET | ORAL | Status: AC
  Administered 2017-03-23: 1000 mg via ORAL

## 2017-03-23 MED ORDER — MIDAZOLAM HCL 2 MG/2ML IJ SOLN
1.0000 mg | INTRAMUSCULAR | Status: DC | PRN
  Administered 2017-03-23: 2 mg via INTRAVENOUS

## 2017-03-23 MED ORDER — SCOPOLAMINE 1 MG/3DAYS TD PT72: 1.0000 | MEDICATED_PATCH | Freq: Once | TRANSDERMAL | Status: DC | PRN

## 2017-03-23 MED ORDER — METHYLENE BLUE 0.5 % INJ SOLN
INTRAVENOUS | Status: DC | PRN
  Administered 2017-03-23: 5 mL via INTRADERMAL

## 2017-03-23 MED ORDER — ACETAMINOPHEN 500 MG PO TABS
ORAL_TABLET | ORAL | Status: AC
  Filled 2017-03-23: qty 2

## 2017-03-23 MED ORDER — HYDROMORPHONE HCL 1 MG/ML IJ SOLN
INTRAMUSCULAR | Status: AC
  Filled 2017-03-23: qty 1

## 2017-03-23 MED ORDER — ONDANSETRON HCL 4 MG/2ML IJ SOLN: 4.0000 mg | Freq: Once | INTRAMUSCULAR | Status: DC | PRN

## 2017-03-23 MED ORDER — DEXAMETHASONE SODIUM PHOSPHATE 4 MG/ML IJ SOLN
INTRAMUSCULAR | Status: DC | PRN
  Administered 2017-03-23: 10 mg via INTRAVENOUS

## 2017-03-23 MED ORDER — FENTANYL CITRATE (PF) 100 MCG/2ML IJ SOLN
50.0000 ug | INTRAMUSCULAR | Status: DC | PRN
  Administered 2017-03-23: 50 ug via INTRAVENOUS
  Administered 2017-03-23: 100 ug via INTRAVENOUS

## 2017-03-23 MED ORDER — SODIUM CHLORIDE 0.9 % IJ SOLN
INTRAMUSCULAR | Status: AC
  Filled 2017-03-23: qty 10

## 2017-03-23 MED ORDER — MIDAZOLAM HCL 2 MG/2ML IJ SOLN
INTRAMUSCULAR | Status: AC
  Filled 2017-03-23: qty 2

## 2017-03-23 MED ORDER — HEPARIN SOD (PORK) LOCK FLUSH 100 UNIT/ML IV SOLN
INTRAVENOUS | Status: AC
  Filled 2017-03-23: qty 5

## 2017-03-23 SURGICAL SUPPLY — 73 items
APPLIER CLIP 9.375 MED OPEN (MISCELLANEOUS) ×3
BAG DECANTER FOR FLEXI CONT (MISCELLANEOUS) ×3 IMPLANT
BENZOIN TINCTURE PRP APPL 2/3 (GAUZE/BANDAGES/DRESSINGS) IMPLANT
BINDER BREAST LRG (GAUZE/BANDAGES/DRESSINGS) IMPLANT
BINDER BREAST MEDIUM (GAUZE/BANDAGES/DRESSINGS) IMPLANT
BINDER BREAST XLRG (GAUZE/BANDAGES/DRESSINGS) ×3 IMPLANT
BINDER BREAST XXLRG (GAUZE/BANDAGES/DRESSINGS) IMPLANT
BLADE HEX COATED 2.75 (ELECTRODE) ×3 IMPLANT
BLADE SURG 11 STRL SS (BLADE) ×3 IMPLANT
BLADE SURG 15 STRL LF DISP TIS (BLADE) ×6 IMPLANT
BLADE SURG 15 STRL SS (BLADE) ×3
CANISTER SUC SOCK COL 7IN (MISCELLANEOUS) IMPLANT
CANISTER SUCT 1200ML W/VALVE (MISCELLANEOUS) ×3 IMPLANT
CHLORAPREP W/TINT 26ML (MISCELLANEOUS) ×3 IMPLANT
CLIP APPLIE 9.375 MED OPEN (MISCELLANEOUS) ×2 IMPLANT
COVER BACK TABLE 60X90IN (DRAPES) ×3 IMPLANT
COVER MAYO STAND STRL (DRAPES) ×3 IMPLANT
COVER PROBE 5X48 (MISCELLANEOUS) ×1
COVER PROBE W GEL 5X96 (DRAPES) ×3 IMPLANT
DECANTER SPIKE VIAL GLASS SM (MISCELLANEOUS) IMPLANT
DERMABOND ADVANCED (GAUZE/BANDAGES/DRESSINGS) ×2
DERMABOND ADVANCED .7 DNX12 (GAUZE/BANDAGES/DRESSINGS) ×4 IMPLANT
DEVICE DUBIN W/COMP PLATE 8390 (MISCELLANEOUS) ×3 IMPLANT
DRAPE C-ARM 42X72 X-RAY (DRAPES) ×3 IMPLANT
DRAPE LAPAROSCOPIC ABDOMINAL (DRAPES) ×6 IMPLANT
DRAPE UTILITY XL STRL (DRAPES) ×3 IMPLANT
DRSG TEGADERM 2-3/8X2-3/4 SM (GAUZE/BANDAGES/DRESSINGS) IMPLANT
ELECT COATED BLADE 2.86 ST (ELECTRODE) ×6 IMPLANT
ELECT REM PT RETURN 9FT ADLT (ELECTROSURGICAL) ×3
ELECTRODE REM PT RTRN 9FT ADLT (ELECTROSURGICAL) ×2 IMPLANT
GAUZE SPONGE 4X4 12PLY STRL LF (GAUZE/BANDAGES/DRESSINGS) IMPLANT
GEL ULTRASOUND 8.5O AQUASONIC (MISCELLANEOUS) IMPLANT
GLOVE BIOGEL PI IND STRL 7.0 (GLOVE) ×2 IMPLANT
GLOVE BIOGEL PI IND STRL 8 (GLOVE) ×2 IMPLANT
GLOVE BIOGEL PI INDICATOR 7.0 (GLOVE) ×1
GLOVE BIOGEL PI INDICATOR 8 (GLOVE) ×1
GLOVE ECLIPSE 8.0 STRL XLNG CF (GLOVE) ×9 IMPLANT
GLOVE SURG SS PI 7.0 STRL IVOR (GLOVE) ×3 IMPLANT
GOWN STRL REUS W/ TWL LRG LVL3 (GOWN DISPOSABLE) ×6 IMPLANT
GOWN STRL REUS W/ TWL XL LVL3 (GOWN DISPOSABLE) ×2 IMPLANT
GOWN STRL REUS W/TWL LRG LVL3 (GOWN DISPOSABLE) ×3
GOWN STRL REUS W/TWL XL LVL3 (GOWN DISPOSABLE) ×1
HEMOSTAT SNOW SURGICEL 2X4 (HEMOSTASIS) ×6 IMPLANT
IV KIT MINILOC 20X1 SAFETY (NEEDLE) IMPLANT
KIT CVR 48X5XPRB PLUP LF (MISCELLANEOUS) ×2 IMPLANT
KIT MARKER MARGIN INK (KITS) ×3 IMPLANT
KIT PORT POWER 8FR ISP CVUE (Catheter) ×3 IMPLANT
NDL SAFETY ECLIPSE 18X1.5 (NEEDLE) ×2 IMPLANT
NEEDLE HYPO 18GX1.5 SHARP (NEEDLE) ×1
NEEDLE HYPO 22GX1.5 SAFETY (NEEDLE) IMPLANT
NEEDLE HYPO 25X1 1.5 SAFETY (NEEDLE) ×9 IMPLANT
NEEDLE SPNL 22GX3.5 QUINCKE BK (NEEDLE) IMPLANT
NS IRRIG 1000ML POUR BTL (IV SOLUTION) ×3 IMPLANT
PACK BASIN DAY SURGERY FS (CUSTOM PROCEDURE TRAY) ×3 IMPLANT
PENCIL BUTTON HOLSTER BLD 10FT (ELECTRODE) ×6 IMPLANT
SET SHEATH INTRODUCER 10FR (MISCELLANEOUS) IMPLANT
SHEATH COOK PEEL AWAY SET 9F (SHEATH) IMPLANT
SLEEVE SCD COMPRESS KNEE MED (MISCELLANEOUS) ×3 IMPLANT
SPONGE LAP 4X18 X RAY DECT (DISPOSABLE) ×6 IMPLANT
STRIP CLOSURE SKIN 1/2X4 (GAUZE/BANDAGES/DRESSINGS) IMPLANT
SUT MNCRL AB 4-0 PS2 18 (SUTURE) ×3 IMPLANT
SUT MON AB 4-0 PC3 18 (SUTURE) ×3 IMPLANT
SUT PROLENE 2 0 CT2 30 (SUTURE) IMPLANT
SUT PROLENE 2 0 SH DA (SUTURE) ×6 IMPLANT
SUT SILK 2 0 TIES 17X18 (SUTURE)
SUT SILK 2-0 18XBRD TIE BLK (SUTURE) IMPLANT
SUT VICRYL 3-0 CR8 SH (SUTURE) ×6 IMPLANT
SYR 5ML LUER SLIP (SYRINGE) ×3 IMPLANT
SYR CONTROL 10ML LL (SYRINGE) ×9 IMPLANT
TOWEL OR 17X24 6PK STRL BLUE (TOWEL DISPOSABLE) ×6 IMPLANT
TOWEL OR NON WOVEN STRL DISP B (DISPOSABLE) ×6 IMPLANT
TUBE CONNECTING 20X1/4 (TUBING) ×3 IMPLANT
YANKAUER SUCT BULB TIP NO VENT (SUCTIONS) ×3 IMPLANT

## 2017-03-23 NOTE — H&P (View-Only) (Signed)
Jessica [name] 03/02/2017 3:03 PM Location: [facility] Patient #: [id number] DOB: 03/04/1941 Married / Language: Jessica [name] / Race: White Female  History of Present Illness Jessica Moores A. Mahoganie Basher [name]; 03/02/2017 5:18 PM) Patient words: Patient sent at the request of Dr. Enriqueta [name] for abnormal mammogram. Patient was noted to have a cluster of left breast microcalcifications upper inner quadrant measuring 1.5 cm there were atypical. Core biopsy was done which showed invasive ductal carcinoma ER negative PR negative HER-2/neu negative with a growth fraction of 70%. Patient has no family history of breast cancer. Patient denies any history of breast pain, nipple discharge or other issue with either breast.                          CLINICAL DATA: Screening. EXAM: 2D DIGITAL SCREENING BILATERAL MAMMOGRAM WITH CAD AND ADJUNCT TOMO COMPARISON: Previous exam(s). ACR Breast Density Category b: There are scattered areas of fibroglandular density. FINDINGS: In the left breast, calcifications warrant further evaluation. In the right breast, no findings suspicious for malignancy. Images were processed with CAD. IMPRESSION: Further evaluation is suggested for calcifications in the left breast. RECOMMENDATION: Diagnostic mammogram of the left breast. (Code:[id number]) The patient will be contacted regarding the findings, and additional imaging will be scheduled. BI-RADS CATEGORY 0: Incomplete. Need additional imaging evaluation and/or prior mammograms for comparison. Electronically Signed By: Jessica [name] M.D On: 02/04/2017 15:30   MM Digital Diagnostic Unilat L (Order [id number]) - Reflex for Order [id number] Study Result  CLINICAL DATA: Patient returns today to evaluate left breast calcifications identified on a recent screening mammogram. EXAM: DIGITAL DIAGNOSTIC LEFT MAMMOGRAM WITH CAD COMPARISON: Previous exams including recent screening mammogram  dated 02/04/2017. ACR Breast Density Category c: The breast tissue is heterogeneously dense, which may obscure small masses. FINDINGS: Grouped pleomorphic calcifications are confirmed within the upper inner quadrant of the left breast, spanning 1.5 cm. Additional scattered benign-appearing calcifications are seen within the outer left breast. Mammographic images were processed with CAD. IMPRESSION: Suspicious pleomorphic calcifications within the upper inner quadrant of the left breast, spanning 1.5 cm, for which stereotactic biopsy is recommended. RECOMMENDATION: Stereotactic biopsy of the left breast calcifications. Stereotactic biopsy is scheduled for 02/11/2017. I have discussed the findings and recommendations with the patient. Results were also provided in writing at the conclusion of the visit. If applicable, a reminder letter will be sent to the patient regarding the next appointment. BI-RADS CATEGORY 4: Suspicious. Electronically Signed By: Jessica [name] M.D. On: 02/09/2017 13:50      for Jessica [name] ([id number]) Patient: Jessica, [name] Collected: 02/24/2017 Client: [facility] Imaging Accession: [id number] Received: 02/24/2017 A. Jessica So, [name] DOB: 1941/07/24 Age: 76 Gender: F Reported: 02/25/2017 [place] Patient Ph: [phone] MRN #: [id number] [place], [place] [address] Client Acc#: Chart #: [id number] Phone: [phone] Fax: [phone] CC: Jessica [name] CC cc: Jessica Pretty, [name] REPORT OF SURGICAL PATHOLOGY ADDITIONAL INFORMATION: PROGNOSTIC INDICATORS Results: IMMUNOHISTOCHEMICAL AND MORPHOMETRIC ANALYSIS PERFORMED MANUALLY Estrogen Receptor: 0%, NEGATIVE Progesterone Receptor: 0%, NEGATIVE Proliferation Marker Ki67: 70% OF NOTE, THERE IS MINIMAL INVASIVE TUMOR PRESENT FOR EVALUATION. COMMENT: The negative hormone receptor study(ies) in this case has An [name] positive control. REFERENCE RANGE ESTROGEN  RECEPTOR NEGATIVE 0% POSITIVE =>1% REFERENCE RANGE PROGESTERONE RECEPTOR NEGATIVE 0% POSITIVE =>1% All controls stained appropriately Jessica Cutter [name] Pathologist, Electronic Signature ( Signed 03/02/2017) FLUORESCENCE IN-SITU HYBRIDIZATION Results: HER2 - NEGATIVE RATIO OF HER2/CEP17 SIGNALS 1.26 AVERAGE HER2 COPY NUMBER PER CELL  8.11 1 of 3 Duplicate copy FINAL for Jessica, [name] ([id number]) ADDITIONAL INFORMATION:(continued) Reference Range: NEGATIVE HER2/CEP17 Ratio <2.0 and average HER2 copy number <4.0 EQUIVOCAL HER2/CEP17 Ratio <2.0 and average HER2 copy number >=4.0 and <6.0 POSITIVE HER2/CEP17 Ratio >=2.0 or <2.0 and average HER2 copy number >=6.0 This is NOT signed out FINAL DIAGNOSIS Diagnosis Breast, left, needle core biopsy, upper inner quadrant INVASIVE DUCTAL CARCINOMA, GRADE 3 DUCTAL CARCINOMA IN SITU WITH CALCIFICATIONS AND NECROSIS, GRADE 3 Microscopic Comment The breast prognostic profile has been ordered. Jessica [name] Pathologist, Electronic Signature.  The patient is a 76 year old female.   Past Surgical History (Jessica [name], CMA; 03/02/2017 3:04 PM) Breast Biopsy Left. Cataract Surgery Bilateral. Colon Polyp Removal - Colonoscopy Hemorrhoidectomy Hysterectomy (not due to cancer) - Complete Oral Surgery Shoulder Surgery Left. Tonsillectomy  Diagnostic Studies History (Jessica [name], CMA; 03/02/2017 3:03 PM) Colonoscopy 1-5 years ago Mammogram within last year Pap Smear >5 years ago  Allergies (Jessica [name], CMA; 03/02/2017 3:06 PM) Penicillin VK *PENICILLINS* Codeine Phosphate *ANALGESICS - OPIOID* Laryngeal spasm. Ibuprofen *ANALGESICS - ANTI-INFLAMMATORY* Allergies Reconciled  Medication History (Jessica [name], CMA; 03/02/2017 3:08 PM) Benazepril HCl ('20MG'$  Tablet, Oral) Active. HydroCHLOROthiazide (12.'5MG'$  Capsule, Oral) Active. Levothyroxine Sodium (75MCG Tablet, Oral) Active. Venlafaxine HCl ER ('75MG'$  Capsule  ER 24HR, Oral) Active. Caltrate 600 + D (600-'125MG'$ -IU Tablet, Oral) Active. L-Lysine ('1000MG'$  Tablet, Oral) Active. Omeprazole ('20MG'$  Tablet DR, Oral) Active. Multivitamin Adult (Oral) Active. Medications Reconciled  Social History (Jessica [name], CMA; 03/02/2017 3:04 PM) Caffeine use Carbonated beverages, Coffee, Tea. No alcohol use No drug use Tobacco use Never smoker.  Family History (Jessica [name], CMA; 03/02/2017 3:04 PM) Arthritis Mother. Diabetes Mellitus Mother. Heart Disease Brother, Father, Mother. Heart disease in female family member before age 83 Heart disease in female family member before age 27 Prostate Cancer Brother. Thyroid problems Mother.  Pregnancy / Birth History (Jessica [name], CMA; 03/02/2017 3:04 PM) Age at menarche 36 years. Age of menopause >60 Contraceptive History Oral contraceptives. Gravida 3 Maternal age 67-25 Para 3 Regular periods  Other Problems (Jessica [name], CMA; 03/02/2017 3:04 PM) Asthma Breast Cancer Depression Gastroesophageal Reflux Disease Hemorrhoids High blood pressure Hypercholesterolemia Kidney Stone Lump In Breast Thyroid Disease     Review of Systems (Jessica [name] CMA; 03/02/2017 3:04 PM) General Present- Night Sweats. Not Present- Appetite Loss, Chills, Fatigue, Fever, Weight Gain and Weight Loss. Skin Not Present- Change in Wart/Mole, Dryness, Hives, Jaundice, New Lesions, Non-Healing Wounds, Rash and Ulcer. HEENT Present- Oral Ulcers, Seasonal Allergies and Wears glasses/contact lenses. Not Present- Earache, Hearing Loss, Hoarseness, Nose Bleed, Ringing in the Ears, Sinus Pain, Sore Throat, Visual Disturbances and Yellow Eyes. Respiratory Present- Snoring. Not Present- Bloody sputum, Chronic Cough, Difficulty Breathing and Wheezing. Breast Present- Breast Mass. Not Present- Breast Pain, Nipple Discharge and Skin Changes. Cardiovascular Not Present- Chest Pain, Difficulty Breathing Lying  Down, Leg Cramps, Palpitations, Rapid Heart Rate, Shortness of Breath and Swelling of Extremities. Gastrointestinal Present- Constipation, Hemorrhoids, Indigestion and Rectal Pain. Not Present- Abdominal Pain, Bloating, Bloody Stool, Change in Bowel Habits, Chronic diarrhea, Difficulty Swallowing, Excessive gas, Gets full quickly at meals, Nausea and Vomiting. Female Genitourinary Not Present- Frequency, Nocturia, Painful Urination, Pelvic Pain and Urgency. Musculoskeletal Not Present- Back Pain, Joint Pain, Joint Stiffness, Muscle Pain, Muscle Weakness and Swelling of Extremities. Neurological Not Present- Decreased Memory, Fainting, Headaches, Numbness, Seizures, Tingling, Tremor, Trouble walking and Weakness. Psychiatric Present- Depression. Not Present- Anxiety, Bipolar, Change in Sleep Pattern, Fearful and Frequent crying. Endocrine Present- Hot flashes. Not  Present- Cold Intolerance, Excessive Hunger, Hair Changes, Heat Intolerance and New Diabetes. Hematology Not Present- Blood Thinners, Easy Bruising, Excessive bleeding, Gland problems, HIV and Persistent Infections.  Vitals (Jessica [name] CMA; 03/02/2017 3:08 PM) 03/02/2017 3:08 PM Weight: 174 lb Height: 62in Body Surface Area: 1.8 m Body Mass Index: 31.82 kg/m  Temp.: 98.40F  Pulse: 76 (Regular)  BP: 130/80 (Sitting, Left Arm, Standard)      Physical Exam (Minard Millirons A. Roxy Filler [name]; 03/02/2017 5:18 PM)  General Mental Status-Alert. General Appearance-Consistent with stated age. Hydration-Well hydrated. Voice-Normal.  Head and Neck Head-normocephalic, atraumatic with no lesions or palpable masses. Trachea-midline. Thyroid Gland Characteristics - normal size and consistency.  Eye Eyeball - Bilateral-Extraocular movements intact. Sclera/Conjunctiva - Bilateral-No scleral icterus.  Chest and Lung Exam Chest and lung exam reveals -quiet, even and easy respiratory effort with no use of accessory  muscles and on auscultation, normal breath sounds, no adventitious sounds and normal vocal resonance. Inspection Chest Wall - Normal. Back - normal.  Breast Note: Left breast shows bruising left upper quadrant. No discrete mass. Left nipple is normal. Right breast is normal. No evidence of nipple discharge. No masses.  Cardiovascular Cardiovascular examination reveals -normal heart sounds, regular rate and rhythm with no murmurs and normal pedal pulses bilaterally.  Neurologic Neurologic evaluation reveals -alert and oriented x 3 with no impairment of recent or remote memory. Mental Status-Normal.  Musculoskeletal Normal Exam - Left-Upper Extremity Strength Normal and Lower Extremity Strength Normal. Normal Exam - Right-Upper Extremity Strength Normal and Lower Extremity Strength Normal.  Lymphatic Head & Neck  General Head & Neck Lymphatics: Bilateral - Description - Normal. Axillary  General Axillary Region: Bilateral - Description - Normal. Tenderness - Non Tender.    Assessment & Plan (Rasheedah Reis A. Elizer Bostic [name]; 03/02/2017 5:18 PM)  BREAST CANCER, LEFT (C50.912) Impression: Triple negative left breast cancer. She is a good lumpectomy candidate of explaining the procedure to her as well as the pros and cons of breast conservation versus mastectomy and reconstruction. I will refer her to medical and radiation oncology. Risk of lumpectomy include bleeding, infection, seroma, more surgery, use of seed/wire, wound care, cosmetic deformity and the need for other treatments, death , blood clots, death. Pt agrees to proceed. Risk of sentinel lymph node mapping include bleeding, infection, lymphedema, shoulder pain. stiffness, dye allergy. cosmetic deformity , blood clots, death, need for more surgery. Pt agres to proceed.  Current Plans Referred to Oncology, for evaluation and follow up (Oncology). Routine. Referred to Radiation Oncology, for evaluation and follow up (Radiation  Oncology). Routine. Referred to Physical Therapy, for evaluation and follow up (Physical Therapy). Routine. You are being scheduled for surgery- Our schedulers will call you.  You should hear from our office's scheduling department within 5 working days about the location, date, and time of surgery. We try to make accommodations for patient's preferences in scheduling surgery, but sometimes the OR schedule or the surgeon's schedule prevents [place] from making those accommodations.  If you have not heard from our office [phone]) in 5 working days, call the office and ask for your surgeon's nurse.  If you have other questions about your diagnosis, plan, or surgery, call the office and ask for your surgeon's nurse.  Pt Education - [facility] Breast Cancer Information Given - [employer] "Breast Journey" Package We discussed the staging and pathophysiology of breast cancer. We discussed all of the different options for treatment for breast cancer including surgery, chemotherapy, radiation therapy, Herceptin, and antiestrogen therapy. We discussed a  sentinel lymph node biopsy as she does not appear to having lymph node involvement right now. We discussed the performance of that with injection of radioactive tracer and blue dye. We discussed that she would have an incision underneath her axillary hairline. We discussed that there is a bout a 10-20% chance of having a positive node with a sentinel lymph node biopsy and we will await the permanent pathology to make any other first further decisions in terms of her treatment. One of these options might be to return to the operating room to perform an axillary lymph node dissection. We discussed about a 1-2% risk lifetime of chronic shoulder pain as well as lymphedema associated with a sentinel lymph node biopsy. We discussed the options for treatment of the breast cancer which included lumpectomy versus a mastectomy. We discussed the performance of the lumpectomy  with a wire placement. We discussed a 10-20% chance of a positive margin requiring reexcision in the operating room. We also discussed that she may need radiation therapy or antiestrogen therapy or both if she undergoes lumpectomy. We discussed the mastectomy and the postoperative care for that as well. We discussed that there is no difference in her survival whether she undergoes lumpectomy with radiation therapy or antiestrogen therapy versus a mastectomy. There is a slight difference in the local recurrence rate being 3-5% with lumpectomy and about 1% with a mastectomy. We discussed the risks of operation including bleeding, infection, possible reoperation. She understands her further therapy will be based on what her stages at the time of her operation.  Pt Education - [facility] breast cancer surgery: discussed with patient and provided information. Pt Education - [facility] Breast Biopsy HCI: discussed with patient and provided information. Pt Education - ABC (After Breast Cancer) Class Info: discussed with patient and provided information.

## 2017-03-23 NOTE — Anesthesia Preprocedure Evaluation (Signed)
Anesthesia Evaluation  Patient identified by MRN, date of birth, ID band Patient awake    Reviewed: Allergy & Precautions, NPO status , Patient's Chart, lab work & pertinent test results  Airway Mallampati: I  TM Distance: >3 FB Neck ROM: Full    Dental   Pulmonary    Pulmonary exam normal        Cardiovascular hypertension, Pt. on medications Normal cardiovascular exam     Neuro/Psych    GI/Hepatic GERD  Medicated and Controlled,  Endo/Other    Renal/GU      Musculoskeletal   Abdominal   Peds  Hematology   Anesthesia Other Findings   Reproductive/Obstetrics                             Anesthesia Physical Anesthesia Plan  ASA: II  Anesthesia Plan: General   Post-op Pain Management:  Regional for Post-op pain   Induction: Intravenous  Airway Management Planned: LMA  Additional Equipment:   Intra-op Plan:   Post-operative Plan: Extubation in OR  Informed Consent: I have reviewed the patients History and Physical, chart, labs and discussed the procedure including the risks, benefits and alternatives for the proposed anesthesia with the patient or authorized representative who has indicated his/her understanding and acceptance.     Plan Discussed with: CRNA and Surgeon  Anesthesia Plan Comments:         Anesthesia Quick Evaluation

## 2017-03-23 NOTE — Progress Notes (Signed)
Assisted Dr. Conrad Jennings with left, ultrasound guided, pectoralis block and nuc med tech # [phone] with nuc med inj. Side rails up, monitors on throughout procedure. See vital signs in flow sheet. Tolerated Procedure well.

## 2017-03-23 NOTE — Interval H&P Note (Signed)
History and Physical Interval Note:  03/23/2017 10:30 AM  Jessica [name]  has presented today for surgery, with the diagnosis of LEFT BREAST CANCER  The various methods of treatment have been discussed with the patient and family. After consideration of risks, benefits and other options for treatment, the patient has consented to  Procedure(s): LEFT BREAST RADIOACTIVE SEED GUIDED PARTIAL MASTECTOMY AND  SENTINEL LYMPH NODE MAPPING (Left) INSERTION PORT-A-CATH (N/A) as a surgical intervention .  The patient's history has been reviewed, patient examined, no change in status, stable for surgery.  I have reviewed the patient's chart and labs.  Questions were answered to the patient's satisfaction. Risk of port include bleeding  Infection collapse lung bleeding around catheter and migration.   Richie Bonanno A.

## 2017-03-23 NOTE — Transfer of Care (Signed)
Immediate Anesthesia Transfer of Care Note  Patient: Jessica [name]  Procedure(s) Performed: Procedure(s): LEFT BREAST RADIOACTIVE SEED GUIDED PARTIAL MASTECTOMY AND  SENTINEL LYMPH NODE MAPPING (Left) INSERTION PORT-A-CATH (Right)  Patient Location: PACU  Anesthesia Type:GA combined with regional for post-op pain  Level of Consciousness: awake and patient cooperative  Airway & Oxygen Therapy: Patient Spontanous Breathing and Patient connected to face mask oxygen  Post-op Assessment: Report given to RN and Post -op Vital signs reviewed and stable  Post vital signs: Reviewed and stable  Last Vitals:  Vitals:   03/23/17 1009 03/23/17 1010  BP:    Pulse: 78 77  Resp: 16 14  Temp:      Last Pain: There were no vitals filed for this visit.    Patients Stated Pain Goal: 0 (94/32/76 1470)  Complications: No apparent anesthesia complications

## 2017-03-23 NOTE — Anesthesia Procedure Notes (Signed)
Procedure Name: LMA Insertion Date/Time: 03/23/2017 10:51 AM Performed by: Lainy Wrobleski D Pre-anesthesia Checklist: Patient identified, Emergency Drugs available, Suction available and Patient being monitored Patient Re-evaluated:Patient Re-evaluated prior to inductionOxygen Delivery Method: Circle system utilized Preoxygenation: Pre-oxygenation with 100% oxygen Intubation Type: IV induction Ventilation: Mask ventilation without difficulty LMA: LMA inserted LMA Size: 3.0 Number of attempts: 1 Airway Equipment and Method: Bite block Placement Confirmation: positive ETCO2 Tube secured with: Tape Dental Injury: Teeth and Oropharynx as per pre-operative assessment

## 2017-03-23 NOTE — Op Note (Signed)
Preoperative diagnosis: Stage I left breast cancer upper-outer quadrant   Postoperative diagnosis: Same   Procedure: Left breast seed localized lumpectomy with left axillary sentinel lymph node mapping deep  with methylene blue dye   Port placement right internal jugular with U/S and C arm guidance  Surgeon: Erroll Luna M.D.    Anesthesia: LMA with pectoral block anesthesia and local  EBL: 20 cc   Specimen: Left breast mass with clipp and seed to pathology and two  axillary sentinel node hot and blue    Drains: None   Clinical History and Indications: The patient is getting ready to begin chemotherapy for her cancer and has elected for breast conserving surgery for her breast cancer.  . She  needs a Port-A-Cath for venous access. Risk of bleeding, infection,  Collapse lung,  Death,  DVT,  Organ injury,  Mediastinal injury,  Injury to heart,  Injury to blood vessels,  Nerves,  Migration of catheter,  Embolization of catheter and the need for more surgery.    Patient presents for treatment of her left breast cancer. She has opted for breast conservation after lengthy discussion of treatment options to include breast conservation surgery and mastectomy and reconstruction. Risks, benefits and alternatives discussed with the patient.The procedure has been discussed with the patient. Alternatives to surgery have been discussed with the patient. Risks of surgery include bleeding, Infection, Seroma formation, death, and the need for further surgery. The patient understands and wishes to proceed.Sentinel lymph node mapping and dissection has been discussed with the patient. Risk of bleeding, Infection, Seroma formation, Additional procedures,, Shoulder weakness , Shoulder stiffness, Nerve and blood vessel injury and reaction to the mapping dyes have been discussed. Alternatives to surgery have been discussed with the patient. The patient agrees to proceed.    The procedure has been discussed  with the patient. Alternatives to surgery have been discussed with the patient.  Risks of surgery include bleeding,  Infection,  Seroma formation, death,  and the need for further surgery.   The patient understands and wishes to proceed.  Sentinel lymph node mapping and dissection has been discussed with the patient.  Risk of bleeding,  Infection,  Seroma formation,  Additional procedures,,  Shoulder weakness ,  Shoulder stiffness,  Nerve and blood vessel injury and reaction to the mapping dyes have been discussed.  Alternatives to surgery have been discussed with the patient.  The patient agrees to proceed.  Description of Procedure: I have seen the patient in the holding area and confirmed the plans for the procedure as noted above. I reviewed the risks and complications again and the patient has no further questions. She wishes to proceed.   The patient was then taken to the operating room. After satisfactory general  anesthesia had been obtained the upper chest and lower neck were prepped and draped as a sterile field.After induction of LMA anesthesia left breast was prepped and draped in a sterile fashion and 4 cc of methylene blue dye were injected in a subareolar position. Of note, patient had pectoral block by anesthesia prior to this The timeout was done.  The right internal jugular vein  was entered under U/S guidance  and the guidewire threaded into the superior vena cava right atrial area under fluoroscopic guidance. An incision was then made on the anterior chest wall and a subcutaneous pocket fashioned for the port reservoir.  The port tubing was then brought through a subcutaneous tunnel from the port site to the guidewire site.  The port and catheter were attached, locked  and flushed. The catheter was measured and cut to appropriate length.The dilator and peel-away sheath were then advanced over the guidewire while monitoring this with fluoroscopy. The guidewire and dilator were removed  and the tubing threaded to approximately 21 cm. The peel-away sheath was then removed. The catheter aspirated and flushed easily. Using fluoroscopy the tip was in the superior vena cava right atrial junction area. It aspirated and flushed easily. That aspirated and flushed easily.  The reservoir was secured to the fascia with 1 sutures of 2-0 Prolene. A final check with fluoroscopy was done to make sure we had no kinks and good positioning of the tip of the catheter. Everything appeared to be okay. The catheter was aspirated, flushed with dilute heparin and then concentrated aqueous heparin.  The incision was then closed with interrupted 3-0 Vicryl, and 4-0 Monocryl subcuticular with Dermabond on the skin.  There were no operative complications. Estimated blood loss was minimal. All counts were correct. The patient tolerated the procedure well.        After induction of LMA anesthesia left breast was prepped and draped in a sterile fashion and 4 cc of methylene blue dye were injected in a subareolar position. Of note, patient had pectoral block by anesthesia prior to this. Neoprobe was used to identify the radioactive seen in the left upper-outer quadrant. Curvilinear incision made  Around superior part of nipple and dissection was carried around to excise all tissue around both the clip and seen. Radiograph showed the mass with gross negative margins. Both seed  and clip were in the specimen. Specimen sent to pathology.  Neoprobe was switched to the technetium sulfur colloid setting.  2 Hot spot identify the left axilla. Incision made in the inferior axillary hairline and dissection carried into the axilla. Hot and blue lymph node identified and excised. Background counts approached 0. Wound was irrigated and Surgicel was placed.The wound was  closed with 3-0 Vicryl and 4-0 Monocryl. Lumpectomy site closed in a similar fashion. Dermabond applied. All final counts sponge, needle instruments found to  be correct at this point. Patient awoke, taken to recovery in satisfactory condition.    Turner Daniels, MD, FACS

## 2017-03-23 NOTE — Discharge Instructions (Signed)
[facility] °Office Phone Number [phone] ° °BREAST BIOPSY/ PARTIAL MASTECTOMY: POST OP INSTRUCTIONS ° °Always review your discharge instruction sheet given to you by the facility where your surgery was performed. ° °IF YOU HAVE DISABILITY OR FAMILY LEAVE FORMS, YOU MUST BRING THEM TO THE OFFICE FOR PROCESSING.  DO NOT GIVE THEM TO YOUR DOCTOR. ° °1. A prescription for pain medication may be given to you upon discharge.  Take your pain medication as prescribed, if needed.  If narcotic pain medicine is not needed, then you may take acetaminophen (Tylenol) or ibuprofen (Advil) as needed. °2. Take your usually prescribed medications unless otherwise directed °3. If you need a refill on your pain medication, please contact your pharmacy.  They will contact our office to request authorization.  Prescriptions will not be filled after 5pm or on week-ends. °4. You should eat very light the first 24 hours after surgery, such as soup, crackers, pudding, etc.  Resume your normal diet the day after surgery. °5. Most patients will experience some swelling and bruising in the breast.  Ice packs and a good support bra will help.  Swelling and bruising can take several days to resolve.  °6. It is common to experience some constipation if taking pain medication after surgery.  Increasing fluid intake and taking a stool softener will usually help or prevent this problem from occurring.  A mild laxative (Milk of Magnesia or Miralax) should be taken according to package directions if there are no bowel movements after 48 hours. °7. Unless discharge instructions indicate otherwise, you may remove your bandages 24-48 hours after surgery, and you may shower at that time.  You may have steri-strips (small skin tapes) in place directly over the incision.  These strips should be left on the skin for 7-10 days.  If your surgeon used skin glue on the incision, you may shower in 24 hours.  The glue will flake off over the  next 2-3 weeks.  Any sutures or staples will be removed at the office during your follow-up visit. °8. ACTIVITIES:  You may resume regular daily activities (gradually increasing) beginning the next day.  Wearing a good support bra or sports bra minimizes pain and swelling.  You may have sexual intercourse when it is comfortable. °a. You may drive when you no longer are taking prescription pain medication, you can comfortably wear a seatbelt, and you can safely maneuver your car and apply brakes. °b. RETURN TO WORK:  ______________________________________________________________________________________ °9. You should see your doctor in the office for a follow-up appointment approximately two weeks after your surgery.  Your doctor’s nurse will typically make your follow-up appointment when she calls you with your pathology report.  Expect your pathology report 2-3 business days after your surgery.  You may call to check if you do not hear from us after three days. °10. OTHER INSTRUCTIONS: _______________________________________________________________________________________________ _____________________________________________________________________________________________________________________________________ °_____________________________________________________________________________________________________________________________________ °_____________________________________________________________________________________________________________________________________ ° °WHEN TO CALL YOUR DOCTOR: °1. Fever over 101.0 °2. Nausea and/or vomiting. °3. Extreme swelling or bruising. °4. Continued bleeding from incision. °5. Increased pain, redness, or drainage from the incision. ° °The clinic staff is available to answer your questions during regular business hours.  Please don’t hesitate to call and ask to speak to one of the nurses for clinical concerns.  If you have a medical emergency, go to the nearest  emergency room or call 911.  A surgeon from [facility] is always on call at the hospital. ° °For further questions, please visit [email or web address]  ° ° ° ° ° ° °  PORT-A-CATH: POST OP INSTRUCTIONS  Always review your discharge instruction sheet given to you by the facility where your surgery was performed.   1. A prescription for pain medication may be given to you upon discharge. Take your pain medication as prescribed, if needed. If narcotic pain medicine is not needed, then you make take acetaminophen (Tylenol) or ibuprofen (Advil) as needed.  2. Take your usually prescribed medications unless otherwise directed. 3. If you need a refill on your pain medication, please contact our office. All narcotic pain medicine now requires a paper prescription.  Phoned in and fax refills are no longer allowed by law.  Prescriptions will not be filled after 5 pm or on weekends.  4. You should follow a light diet for the remainder of the day after your procedure. 5. Most patients will experience some mild swelling and/or bruising in the area of the incision. It may take several days to resolve. 6. It is common to experience some constipation if taking pain medication after surgery. Increasing fluid intake and taking a stool softener (such as Colace) will usually help or prevent this problem from occurring. A mild laxative (Milk of Magnesia or Miralax) should be taken according to package directions if there are no bowel movements after 48 hours.  7. Unless discharge instructions indicate otherwise, you may remove your bandages 48 hours after surgery, and you may shower at that time. You may have steri-strips (small white skin tapes) in place directly over the incision.  These strips should be left on the skin for 7-10 days.  If your surgeon used Dermabond (skin glue) on the incision, you may shower in 24 hours.  The glue will flake off over the next 2-3 weeks.  8. If your port is left accessed at  the end of surgery (needle left in port), the dressing cannot get wet and should only by changed by a healthcare professional. When the port is no longer accessed (when the needle has been removed), follow step 7.   9. ACTIVITIES:  Limit activity involving your arms for the next 72 hours. Do no strenuous exercise or activity for 1 week. You may drive when you are no longer taking prescription pain medication, you can comfortably wear a seatbelt, and you can maneuver your car. 10.You may need to see your doctor in the office for a follow-up appointment.  Please       check with your doctor.  11.When you receive a new Port-a-Cath, you will get a product guide and        ID card.  Please keep them in case you need them.  WHEN TO CALL YOUR DOCTOR ([phone]): 1. Fever over 101.0 2. Chills 3. Continued bleeding from incision 4. Increased redness and tenderness at the site 5. Shortness of breath, difficulty breathing   The clinic staff is available to answer your questions during regular business hours. Please don't hesitate to call and ask to speak to one of the nurses or medical assistants for clinical concerns. If you have a medical emergency, go to the nearest emergency room or call 911.  A surgeon from [facility] is always on call at the hospital.     For further information, please visit [email or web address]   Post Anesthesia Home Care Instructions  Activity: Get plenty of rest for the remainder of the day. A responsible individual must stay with you for 24 hours following the procedure.  For the next 24 hours, DO NOT: -Drive a car -Operate machinery -  Drink alcoholic beverages -Take any medication unless instructed by your physician -Make any legal decisions or sign important papers.  Meals: Start with liquid foods such as gelatin or soup. Progress to regular foods as tolerated. Avoid greasy, spicy, heavy foods. If nausea and/or vomiting occur, drink only  clear liquids until the nausea and/or vomiting subsides. Call your physician if vomiting continues.  Special Instructions/Symptoms: Your throat may feel dry or sore from the anesthesia or the breathing tube placed in your throat during surgery. If this causes discomfort, gargle with warm salt water. The discomfort should disappear within 24 hours.  If you had a scopolamine patch placed behind your ear for the management of post- operative nausea and/or vomiting:  1. The medication in the patch is effective for 72 hours, after which it should be removed.  Wrap patch in a tissue and discard in the trash. Wash hands thoroughly with soap and water. 2. You may remove the patch earlier than 72 hours if you experience unpleasant side effects which may include dry mouth, dizziness or visual disturbances. 3. Avoid touching the patch. Wash your hands with soap and water after contact with the patch.        

## 2017-03-24 ENCOUNTER — Encounter (HOSPITAL_BASED_OUTPATIENT_CLINIC_OR_DEPARTMENT_OTHER): Payer: Self-pay | Admitting: Surgery

## 2017-03-24 NOTE — Anesthesia Postprocedure Evaluation (Addendum)
Anesthesia Post Note  Patient: Jessica [name]  Procedure(s) Performed: Procedure(s) (LRB): LEFT BREAST RADIOACTIVE SEED GUIDED PARTIAL MASTECTOMY AND  SENTINEL LYMPH NODE MAPPING (Left) INSERTION PORT-A-CATH (Right)  Patient location during evaluation: PACU Anesthesia Type: General Level of consciousness: awake and alert Pain management: pain level controlled Vital Signs Assessment: post-procedure vital signs reviewed and stable Respiratory status: spontaneous breathing, nonlabored ventilation, respiratory function stable and patient connected to nasal cannula oxygen Cardiovascular status: blood pressure returned to baseline and stable Postop Assessment: no signs of nausea or vomiting Anesthetic complications: no       Last Vitals:  Vitals:   03/23/17 1525 03/23/17 1615  BP: 112/76   Pulse: 80 78  Resp: 16   Temp: 36.7 C     Last Pain:  Vitals:   03/23/17 1615  PainSc: 3                  Fredderick Swanger DAVID

## 2017-03-31 ENCOUNTER — Telehealth: Payer: Self-pay | Admitting: Hematology & Oncology

## 2017-03-31 NOTE — Telephone Encounter (Signed)
Per order to sch apt for patient 3wks from now.  Apt was sch and I called and left a message on vm of apt date and time and mailed out an calendar

## 2017-04-02 NOTE — Progress Notes (Signed)
Location of Breast Cancer: upper inner quadrant of the left breast  Histology per Pathology Report:   03/23/17 Diagnosis 1. Breast, lumpectomy, Left - INVASIVE DUCTAL CARCINOMA, GRADE III/III, SPANNING 2.9 CM. - DUCTAL CARCINOMA IN SITU WITH CALCIFICATIONS, HIGH GRADE. - THE SURGICAL RESECTIONS ARE NEGATIVE FOR CARCINOMA. - SEE ONCOLOGY TABLE BELOW. 2. Breast, excision, Left additional anterior margin - BENIGN FIBROADIPOSE TISSUE. - THERE IS NO EVIDENCE OF MALIGNANCY. - SEE COMMENT. 3. Lymph node, sentinel, biopsy, Left axillary #1 - THERE IS NO EVIDENCE OF CARCINOMA IN 1 OF 1 LYMPH NODE (0/1). 4. Lymph node, sentinel, biopsy, Left axillary #2 - THERE IS NO EVIDENCE OF CARCINOMA IN 1 OF 1 LYMPH NODE (0/1). 5. Lymph node, sentinel, biopsy, Left axillary #3 - THERE IS NO EVIDENCE OF CARCINOMA IN 1 OF 1 LYMPH NODE (0/1).  02/24/17 Diagnosis Breast, left, needle core biopsy, upper inner quadrant INVASIVE DUCTAL CARCINOMA, GRADE 3 DUCTAL CARCINOMA IN SITU WITH CALCIFICATIONS AND NECROSIS, GRADE 3  Receptor Status: ER(0), PR (0), Her2-neu (negative), Ki-(70%)  Did patient present with symptoms (if so, please note symptoms) or was this found on screening mammography?: screening mammogram  Past/Anticipated interventions by surgeon, if any: 03/23/17 -Procedure: LEFT BREAST RADIOACTIVE SEED GUIDED PARTIAL MASTECTOMY AND  SENTINEL LYMPH NODE MAPPING;  Surgeon: Erroll Luna, MD   Past/Anticipated interventions by medical oncology, if any: 4 cycles of Taxotere/carboplatinum - will see Dr. Marin Olp next week.  Lymphedema issues, if any:  no    Pain issues, if any:  yes - has pain under her left arm.  SAFETY ISSUES:  Prior radiation? no  Pacemaker/ICD? no  Possible current pregnancy?no  Is the patient on methotrexate? no  Current Complaints / other details:  Patient is here with her daughter.  She has had a rotater cuff repair on her left shoulder.  BP (!) 141/78 (BP Location:  Right Arm, Patient Position: Sitting)   Pulse 75   Temp 98.2 F (36.8 C) (Oral)   Ht _0  (1.549 m)   Wt 174 lb (78.9 kg)   SpO2 100%   BMI 32.88 kg/m    Wt Readings from Last 3 Encounters:  04/07/17 174 lb (78.9 kg)  03/23/17 174 lb (78.9 kg)  03/08/17 175 lb (79.4 kg)      Jacqulyn Liner, RN 04/02/2017,8:05 AM

## 2017-04-06 ENCOUNTER — Ambulatory Visit: Payer: Medicare Other | Admitting: Radiation Oncology

## 2017-04-07 ENCOUNTER — Ambulatory Visit
Admission: RE | Admit: 2017-04-07 | Discharge: 2017-04-07 | Disposition: A | Discharge status: Home / Self Care | Payer: Medicare Other | Source: Ambulatory Visit | Attending: Radiation Oncology | Admitting: Radiation Oncology

## 2017-04-07 ENCOUNTER — Ambulatory Visit
Admission: RE | Admit: 2017-04-07 | Discharge: 2017-04-07 | Disposition: A | Discharge status: Home / Self Care | Payer: Medicare Other | Source: Ambulatory Visit

## 2017-04-07 ENCOUNTER — Encounter: Payer: Self-pay | Admitting: Radiation Oncology

## 2017-04-07 VITALS — BP 141/78 | HR 75 | Temp 98.2°F | Ht 61.0 in | Wt 174.0 lb

## 2017-04-07 DIAGNOSIS — C50212 Malignant neoplasm of upper-inner quadrant of left female breast: Secondary | ICD-10-CM | POA: Diagnosis not present

## 2017-04-07 DIAGNOSIS — Z9889 Other specified postprocedural states: Secondary | ICD-10-CM | POA: Diagnosis not present

## 2017-04-07 DIAGNOSIS — Z171 Estrogen receptor negative status [ER-]: Secondary | ICD-10-CM | POA: Diagnosis not present

## 2017-04-07 DIAGNOSIS — Z90710 Acquired absence of both cervix and uterus: Secondary | ICD-10-CM | POA: Diagnosis not present

## 2017-04-07 DIAGNOSIS — Z79899 Other long term (current) drug therapy: Secondary | ICD-10-CM | POA: Insufficient documentation

## 2017-04-07 DIAGNOSIS — Z808 Family history of malignant neoplasm of other organs or systems: Secondary | ICD-10-CM | POA: Insufficient documentation

## 2017-04-07 DIAGNOSIS — Z888 Allergy status to other drugs, medicaments and biological substances status: Secondary | ICD-10-CM | POA: Diagnosis not present

## 2017-04-07 DIAGNOSIS — Z79891 Long term (current) use of opiate analgesic: Secondary | ICD-10-CM | POA: Diagnosis not present

## 2017-04-07 DIAGNOSIS — N641 Fat necrosis of breast: Secondary | ICD-10-CM | POA: Diagnosis not present

## 2017-04-07 NOTE — Progress Notes (Signed)
Please see the Nurse Progress Note in the MD Initial Consult Encounter for this patient. 

## 2017-04-07 NOTE — Progress Notes (Signed)
Radiation Oncology         [phone] ________________________________  Initial  Outpatient Consultation  Name: Jessica [name] MRN: [id number]  Date: 04/07/2017  DOB: July 21, 1941  VP:XTGGY, Thayer Jew, MD  Erroll Luna, MD   REFERRING PHYSICIAN: Erroll Luna, MD  DIAGNOSIS: The encounter diagnosis was Malignant neoplasm of upper-inner quadrant of left breast in female, estrogen receptor negative ([place]).    Stage IIA (pT2, pN0) grade 3 invasive ductal carcinoma of the left (triple negative) breast  HISTORY OF PRESENT ILLNESS::Jessica [name] is a 76 y.o. female who had a screening mammogram on 02/04/2017 showing calcifications in the left breast. On 02/09/2017 the patient had a diagnostic left breast mammogram showing grouped calcifications in the UIQ measuring 1.5 cm and additional scattered benign calcifications in the outer left breast.  Biopsy on 02/24/2017 of the UIQ of the left breast revealed grade 3 invasive ductal carcinoma and grade 3 DCIS with calcifications and necrosis. (ER 0% negative, PR 0% negative, HER2 negative, Ki67 70%)  US of the left axilla on 02/26/2017 showed no abnormal lymph nodes.  The patient preceded with a left lumpectomy and sentinel lymph node biopsies on 03/23/2017. Left lumpectomy revealed grade 3 invasive ductal carcinoma spanning 2.9 cm, high grade DCIS with calcifications, and the surgical margins where negative for carcinoma. Biopsy of the three left axillary sentinel lymph nodes showed no evidence of carcinoma. pT2, pN0  The patient presents today in radiation oncology with her daughter to discuss therapy for the management of her disease.  PREVIOUS RADIATION THERAPY: No  PAST MEDICAL HISTORY:  has a past medical history of Allergy; Anemia; Anxiety; Asthma; Blood transfusion without reported diagnosis; Breast cancer ([place]) (01/2017); Breast cancer of upper-inner quadrant of left female breast ([place]) (03/11/2017); Cancer ([place]) (01/2017);  Cataract; Chronic kidney disease; Depression (08/31/2013); Dyslipidemia, goal LDL below 160 (08/31/2013); Essential hypertension - well controlled (08/31/2013); GERD (gastroesophageal reflux disease); Goals of care, counseling/discussion (03/11/2017); Hypothyroidism; Obesity (BMI 30-39.9) (08/31/2013); Thyroid disease; and Vasovagal near-syncope -rare (08/31/2013).    PAST SURGICAL HISTORY: Past Surgical History:  Procedure Laterality Date  . ABDOMINAL HYSTERECTOMY     total  . BREAST BIOPSY Left 01/2017    malignant  . broken fingers    . CATARACT EXTRACTION Bilateral   . COLONOSCOPY    . EXCISIONAL HEMORRHOIDECTOMY    . kidney stones lithotripsy    . PORTACATH PLACEMENT Right 03/23/2017   Procedure: INSERTION PORT-A-CATH;  Surgeon: Erroll Luna, MD;  Location: [facility];  Service: General;  Laterality: Right;  . RADIOACTIVE SEED GUIDED MASTECTOMY WITH AXILLARY SENTINEL LYMPH NODE BIOPSY Left 03/23/2017   Procedure: LEFT BREAST RADIOACTIVE SEED GUIDED PARTIAL MASTECTOMY AND  SENTINEL LYMPH NODE MAPPING;  Surgeon: Erroll Luna, MD;  Location: [facility];  Service: General;  Laterality: Left;  . ROTATOR CUFF REPAIR     left   . TONSILLECTOMY    . WISDOM TOOTH EXTRACTION      FAMILY HISTORY: family history includes Cervical cancer in her maternal aunt.  SOCIAL HISTORY:  reports that she has never smoked. She has never used smokeless tobacco. She reports that she does not drink alcohol or use drugs.  ALLERGIES: Codeine; Penicillins; Advil [ibuprofen]; and Band-aid plus antibiotic [bacitracin-polymyxin b]  MEDICATIONS:  Current Outpatient Prescriptions  Medication Sig Dispense Refill  . benazepril (LOTENSIN) 20 MG tablet Take 20 mg by mouth daily.    . calcium citrate-vitamin D (CITRACAL+D) 315-200 MG-UNIT per tablet Take 1 tablet by mouth daily.    Marland Kitchen  Cold Sore Products (L-LYSINE) OINT Apply topically.    . diphenhydramine-acetaminophen (TYLENOL PM)  25-500 MG TABS Take 1 tablet by mouth at bedtime as needed.    . hydrochlorothiazide (MICROZIDE) 12.5 MG capsule Take 12.5 mg by mouth daily.  1  . levothyroxine (SYNTHROID, LEVOTHROID) 75 MCG tablet Take 75 mcg by mouth daily.    . Multiple Vitamin (MULTIVITAMIN) tablet Take 1 tablet by mouth daily.    Marland Kitchen omeprazole (PRILOSEC OTC) 20 MG tablet Take 20 mg by mouth daily.    Marland Kitchen venlafaxine XR (EFFEXOR-XR) 75 MG 24 hr capsule Take 225 mg by mouth daily.    . celecoxib (CELEBREX) 100 MG capsule Take 1 capsule (100 mg total) by mouth 2 (two) times daily. (Patient not taking: Reported on 04/07/2017) 20 capsule 0  . oxyCODONE (OXY IR/ROXICODONE) 5 MG immediate release tablet Take 1-2 tablets (5-10 mg total) by mouth every 6 (six) hours as needed for severe pain. (Patient not taking: Reported on 04/07/2017) 10 tablet 0   No current facility-administered medications for this encounter.     REVIEW OF SYSTEMS:  On review of systems, the patient reports that she is doing well overall. she denies any chest pain, shortness of breath, cough, fevers, chills, night sweats, unintended weight changes. She denies any bowel or bladder disturbances, and denies abdominal pain, nausea or vomiting. She complains of pain under her left arm. She denied left breast swelling. A complete review of systems is obtained and is otherwise negative.    PHYSICAL EXAM:  height is _0  (1.549 m) and weight is 174 lb (78.9 kg). Her oral temperature is 98.2 F (36.8 C). Her blood pressure is 141/78 (abnormal) and her pulse is 75. Her oxygen saturation is 100%.   General: Alert and oriented, in no acute distress HEENT: Head is normocephalic. Extraocular movements are intact. Oropharynx is clear. Neck: Neck is supple, no palpable cervical or supraclavicular lymphadenopathy. Heart: Regular in rate and rhythm with no murmurs, rubs, or gallops. Chest: Clear to auscultation bilaterally, with no rhonchi, wheezes, or rales. Abdomen: Soft,  nontender, nondistended, with no rigidity or guarding. Extremities: No cyanosis or edema. Lymphatics: see Neck Exam Skin: No concerning lesions. Musculoskeletal: symmetric strength and muscle tone throughout. Neurologic: Cranial nerves II through XII are grossly intact. No obvious focalities. Speech is fluent. Coordination is intact. Psychiatric: Judgment and insight are intact. Affect is appropriate. Breast: Port-a-cath in the upper right chest. Left breast periareolar scar healing well with surgical glue. A separate scar in the left axillary region, healing well, no sign of drainage or infection.   ECOG = 1  0 - Asymptomatic (Fully active, able to carry on all predisease activities without restriction)  1 - Symptomatic but completely ambulatory (Restricted in physically strenuous activity but ambulatory and able to carry out work of a light or sedentary nature. For example, light housework, office work)  2 - Symptomatic, <50% in bed during the day (Ambulatory and capable of all self care but unable to carry out any work activities. Up and about more than 50% of waking hours)  3 - Symptomatic, >50% in bed, but not bedbound (Capable of only limited self-care, confined to bed or chair 50% or more of waking hours)  4 - Bedbound (Completely disabled. Cannot carry on any self-care. Totally confined to bed or chair)  5 - Death   Eustace Pen MM, Creech RH, Tormey DC, et al. [phone]). "Toxicity and response criteria of the [facility]". [place] Oncol.  5 (6): 649-55  LABORATORY DATA:  Lab Results  Component Value Date   WBC 4.6 03/18/2017   HGB 12.0 03/18/2017   HCT 36.9 03/18/2017   MCV 98.1 03/18/2017   PLT 257 03/18/2017   NEUTROABS 2.7 03/18/2017   Lab Results  Component Value Date   NA 139 03/18/2017   K 3.9 03/18/2017   CL 103 03/18/2017   CO2 27 03/18/2017   GLUCOSE 107 (H) 03/18/2017   CREATININE 0.77 03/18/2017   CALCIUM 9.3 03/18/2017        RADIOGRAPHY: Mm Breast Surgical Specimen  Result Date: 03/23/2017 CLINICAL DATA:  Radioactive seed localization was performed prior to lumpectomy. EXAM: SPECIMEN RADIOGRAPH OF THE LEFT BREAST COMPARISON:  Previous exam(s). FINDINGS: Status post excision of the left breast. The radioactive seed and biopsy marker clip are present, completely intact, and were marked for pathology. IMPRESSION: Specimen radiograph of the left breast. Electronically Signed   By: Curlene Dolphin M.D.   On: 03/23/2017 12:15   Dg Chest Port 1 View  Result Date: 03/23/2017 CLINICAL DATA:  Breast cancer patient status post Port-A-Cath placement today. EXAM: PORTABLE CHEST 1 VIEW COMPARISON:  None. FINDINGS: Right IJ approach Port-A-Cath is in place with the tip projecting in the mid superior vena cava. Tubing is intact. No pneumothorax. Lungs appear clear. Postoperative change left breast is noted with gas in the soft tissues and surgical clips identified. IMPRESSION: Port-A-Cath tip projects in the mid superior vena cava. No pneumothorax. Postoperative change left breast. Electronically Signed   By: Inge Rise M.D.   On: 03/23/2017 14:25   Dg Fluoro Guide Cv Line-no Report  Result Date: 03/23/2017 Fluoroscopy was utilized by the requesting physician.  No radiographic interpretation.   Mm Lt Radioactive Seed Loc Mammo Guide  Result Date: 03/22/2017 CLINICAL DATA:  76 year old female for radioactive seed localization of a left breast biopsy site demonstrating carcinoma EXAM: MAMMOGRAPHIC GUIDED RADIOACTIVE SEED LOCALIZATION OF THE LEFT BREAST COMPARISON:  Previous exam(s). FINDINGS: Patient presents for radioactive seed localization prior to left lumpectomy. I met with the patient and we discussed the procedure of seed localization including benefits and alternatives. We discussed the high likelihood of a successful procedure. We discussed the risks of the procedure including infection, bleeding, tissue injury and further  surgery. We discussed the low dose of radioactivity involved in the procedure. Informed, written consent was given. The usual time-out protocol was performed immediately prior to the procedure. Using mammographic guidance, sterile technique, 1% lidocaine and an I-125 radioactive seed, the coil shaped biopsy marker in the upper, inner left breast was localized using a superior to inferior approach. The follow-up mammogram images confirm the seed in the expected location and were marked for Dr. Brantley Stage. Follow-up survey of the patient confirms presence of the radioactive seed. Order number of I-125 seed:  [id number]. Total activity:  0.249 mCi  reference Date: 03/22/2017 The patient tolerated the procedure well and was released from the [facility]. She was given instructions regarding seed removal. IMPRESSION: Radioactive seed localization of the left breast. No apparent complications. Electronically Signed   By: Pamelia Hoit M.D.   On: 03/22/2017 14:30      IMPRESSION: Stage IIA (pT2, pN0) grade 3 invasive ductal carcinoma of the left (triple negative) breast  We discussed the pathology findings and reviewed the nature of invasive ductal carcinoma. The patient completed surgery consisting of a left lumpectomy and sentinel lymph node biopsy. Given the triple negative nature of her disease, adjuvant chemotherapy has been  recommended to reduce the risk of recurrence. , The patient's course would then be followed by external radiotherapy to the left breast. We discussed the risks, benefits, short, and long term effects of radiotherapy. We discussed the delivery and logistics of radiotherapy. We discussed the possible use of cardiac sparing with deep inspiration breath hold if needed.  PLAN: The patient is scheduled to follow up with medical oncology, Dr. Marin Olp, to further discuss chemotherapy. We will see her back in approximately 2 to 3 weeks after she completes chemotherapy for CT simulation and treatment  planning. The patient signed a consent form and a copy was placed in her medical chart.     ------------------------------------------------  Blair Promise, PhD, MD  This document serves as a record of services personally performed by Gery Pray, MD. It was created on his behalf by Valeta Harms, a trained medical scribe. The creation of this record is based on the scribe's personal observations and the provider's statements to them. This document has been checked and approved by the attending provider.

## 2017-04-12 ENCOUNTER — Other Ambulatory Visit (HOSPITAL_BASED_OUTPATIENT_CLINIC_OR_DEPARTMENT_OTHER): Payer: Medicare Other

## 2017-04-12 ENCOUNTER — Ambulatory Visit: Payer: Medicare Other | Admitting: Hematology & Oncology

## 2017-04-12 ENCOUNTER — Ambulatory Visit (HOSPITAL_BASED_OUTPATIENT_CLINIC_OR_DEPARTMENT_OTHER): Payer: Medicare Other | Admitting: Hematology & Oncology

## 2017-04-12 VITALS — BP 150/68 | HR 87 | Temp 98.4°F | Wt 175.0 lb

## 2017-04-12 DIAGNOSIS — I10 Essential (primary) hypertension: Secondary | ICD-10-CM | POA: Diagnosis not present

## 2017-04-12 DIAGNOSIS — C50212 Malignant neoplasm of upper-inner quadrant of left female breast: Secondary | ICD-10-CM | POA: Diagnosis not present

## 2017-04-12 DIAGNOSIS — Z171 Estrogen receptor negative status [ER-]: Secondary | ICD-10-CM | POA: Diagnosis not present

## 2017-04-12 DIAGNOSIS — E785 Hyperlipidemia, unspecified: Secondary | ICD-10-CM | POA: Diagnosis not present

## 2017-04-12 DIAGNOSIS — R55 Syncope and collapse: Secondary | ICD-10-CM | POA: Diagnosis not present

## 2017-04-12 LAB — CBC WITH DIFFERENTIAL/PLATELET
BASO%: 0.2 % (ref 0.0–2.0)
Basophils Absolute: 0 10*3/uL (ref 0.0–0.1)
EOS%: 3.9 % (ref 0.0–7.0)
Eosinophils Absolute: 0.2 10*3/uL (ref 0.0–0.5)
HCT: 36.2 % (ref 34.8–46.6)
HGB: 11.7 g/dL (ref 11.6–15.9)
LYMPH%: 25.8 % (ref 14.0–49.7)
MCH: 31.9 pg (ref 25.1–34.0)
MCHC: 32.3 g/dL (ref 31.5–36.0)
MCV: 98.6 fL (ref 79.5–101.0)
MONO#: 0.4 10*3/uL (ref 0.1–0.9)
MONO%: 9.1 % (ref 0.0–14.0)
NEUT#: 3 10*3/uL (ref 1.5–6.5)
NEUT%: 61 % (ref 38.4–76.8)
Platelets: 280 10*3/uL (ref 145–400)
RBC: 3.67 10*6/uL — ABNORMAL LOW (ref 3.70–5.45)
RDW: 12.9 % (ref 11.2–14.5)
WBC: 4.8 10*3/uL (ref 3.9–10.3)
lymph#: 1.3 10*3/uL (ref 0.9–3.3)

## 2017-04-12 LAB — COMPREHENSIVE METABOLIC PANEL
ALT: 13 U/L (ref 0–55)
AST: 15 U/L (ref 5–34)
Albumin: 3.5 g/dL (ref 3.5–5.0)
Alkaline Phosphatase: 61 U/L (ref 40–150)
Anion Gap: 8 mEq/L (ref 3–11)
BUN: 12.3 mg/dL (ref 7.0–26.0)
CO2: 28 mEq/L (ref 22–29)
Calcium: 9.8 mg/dL (ref 8.4–10.4)
Chloride: 103 mEq/L (ref 98–109)
Creatinine: 0.8 mg/dL (ref 0.6–1.1)
EGFR: 74 mL/min/{1.73_m2} — ABNORMAL LOW (ref >90)
Glucose: 120 mg/dl (ref 70–140)
Potassium: 3.9 mEq/L (ref 3.5–5.1)
Sodium: 139 mEq/L (ref 136–145)
Total Bilirubin: 0.27 mg/dL (ref 0.20–1.20)
Total Protein: 6.9 g/dL (ref 6.4–8.3)

## 2017-04-12 MED ORDER — DEXAMETHASONE 4 MG PO TABS: ORAL_TABLET | ORAL | 1 refills | Status: DC

## 2017-04-12 MED ORDER — LIDOCAINE-PRILOCAINE 2.5-2.5 % EX CREA: 1.0000 "application " | TOPICAL_CREAM | CUTANEOUS | 6 refills | Status: DC | PRN

## 2017-04-12 NOTE — Progress Notes (Signed)
Hematology and Oncology Follow Up Visit  Jessica [name] [id number] 10/15/1941 76 y.o. 04/12/2017   Principle Diagnosis:   Stage IIA (T2bN0M0) invasive ductal ca of the LEFT breast - TRIPLE NEGATIVE  Current Therapy:    Taxotere/Carboplatin - cycle #1 on 04/28/2017     Interim History:  Jessica [name] is back for follow-up. She did have her lumpectomy. This was done on May 22. The pathology report ([id number]) showed a 2.9 cm invasive ductal carcinoma. All margins were negative. 3 sentinel lymph nodes were negative. The tumor is triple negative with a very high proliferation index.  She had a Port-A-Cath placed.  She looks great. She has low bit of pain in the triceps area of the left upper arm.  She's had no nausea or vomiting. She's had no change in bowel or bladder habits.  She and her family will be going to the beach next week.  Overall, her performance status is ECOG 1.  Medications:  Current Outpatient Prescriptions:  .  benazepril (LOTENSIN) 20 MG tablet, Take 20 mg by mouth daily., Disp: , Rfl:  .  calcium citrate-vitamin D (CITRACAL+D) 315-200 MG-UNIT per tablet, Take 1 tablet by mouth daily., Disp: , Rfl:  .  celecoxib (CELEBREX) 100 MG capsule, Take 1 capsule (100 mg total) by mouth 2 (two) times daily. (Patient not taking: Reported on 04/07/2017), Disp: 20 capsule, Rfl: 0 .  Cold Sore Products (L-LYSINE) OINT, Apply topically., Disp: , Rfl:  .  dexamethasone (DECADRON) 4 MG tablet, Take 2 pills with food every 12 hours for 4 days.  Start day before each chemotherapy session., Disp: 60 tablet, Rfl: 1 .  diphenhydramine-acetaminophen (TYLENOL PM) 25-500 MG TABS, Take 1 tablet by mouth at bedtime as needed., Disp: , Rfl:  .  hydrochlorothiazide (MICROZIDE) 12.5 MG capsule, Take 12.5 mg by mouth daily., Disp: , Rfl: 1 .  levothyroxine (SYNTHROID, LEVOTHROID) 75 MCG tablet, Take 75 mcg by mouth daily., Disp: , Rfl:  .  lidocaine-prilocaine (EMLA) cream, Apply 1 application  topically as needed., Disp: 30 g, Rfl: 6 .  Multiple Vitamin (MULTIVITAMIN) tablet, Take 1 tablet by mouth daily., Disp: , Rfl:  .  omeprazole (PRILOSEC OTC) 20 MG tablet, Take 20 mg by mouth daily., Disp: , Rfl:  .  oxyCODONE (OXY IR/ROXICODONE) 5 MG immediate release tablet, Take 1-2 tablets (5-10 mg total) by mouth every 6 (six) hours as needed for severe pain. (Patient not taking: Reported on 04/07/2017), Disp: 10 tablet, Rfl: 0 .  venlafaxine XR (EFFEXOR-XR) 75 MG 24 hr capsule, Take 225 mg by mouth daily., Disp: , Rfl:   Allergies:  Allergies  Allergen Reactions  . Codeine Hives  . Penicillins Hives  . Advil [Ibuprofen] Hives and Itching  . Band-Aid Plus Antibiotic [Bacitracin-Polymyxin B] Dermatitis    Past Medical History, Surgical history, Social history, and Family History were reviewed and updated.  Review of Systems:  As above  Physical Exam:  weight is 175 lb (79.4 kg). Her oral temperature is 98.4 F (36.9 C). Her blood pressure is 150/68 (abnormal) and her pulse is 87. Her oxygen saturation is 97%.   Wt Readings from Last 3 Encounters:  04/12/17 175 lb (79.4 kg)  04/07/17 174 lb (78.9 kg)  03/23/17 174 lb (78.9 kg)      Head and neck exam shows no ocular or oral lesions. There are no palpable cervical or supraclavicular lymph nodes. Lungs are clear. Cardiac exam regular rate and rhythm with no murmurs, rubs or bruits. Breast  exam shows right breast with no masses, edema or erythema. There is no right axillary adenopathy. Left breast shows the well healing lumpectomy scar at the 12:00 position. This is at the age of the areola. There is some slight erythema. There is some slight firmness. The lumpectomy scar is well-healing. There is no swelling in the left axilla. Abdomen is soft. She has good bowel sounds. There is no palpable liver or spleen tip. Back exam shows no tenderness over the spine, ribs or hips. Extremities shows no clubbing, cyanosis or edema. Neurological  exam shows no focal neurological deficits. Skin exam shows no rashes, ecchymoses or petechia.  Lab Results  Component Value Date   WBC 4.8 04/12/2017   HGB 11.7 04/12/2017   HCT 36.2 04/12/2017   MCV 98.6 04/12/2017   PLT 280 04/12/2017     Chemistry      Component Value Date/Time   NA 139 04/12/2017 1043   K 3.9 04/12/2017 1043   CL 103 03/18/2017 1206   CO2 28 04/12/2017 1043   BUN 12.3 04/12/2017 1043   CREATININE 0.8 04/12/2017 1043      Component Value Date/Time   CALCIUM 9.8 04/12/2017 1043   ALKPHOS 61 04/12/2017 1043   AST 15 04/12/2017 1043   ALT 13 04/12/2017 1043   BILITOT 0.27 04/12/2017 1043         Impression and Plan: Jessica [name] is a 76 year old postmenopausal white female with a triple negative stage IIA ductal carcinoma of the left breast. She is in very good shape. As such, I think she would be a great candidate for adjuvant chemotherapy.  Given that she is triple negative, I would favor Taxotere/carboplatin on. I think this would be very reasonable.  Given that she is node negative, we can use 4 cycles of treatment. I don't think she could tolerate  dose dense therapy however.  She already has a Port-A-Cath placed. I greatly appreciate Dr. Josetta Huddle incredible surgical skill in taking care of her lumpectomy.  Of note, she will need radiation therapy after chemotherapy.  I went over the side effects of treatment. I talked her about the fact that her white cell count will go down. We will have her on OnPro f White cell support. I talked to him about this in her hair. This will come out in about 2 weeks after the start of her first cycle of treatment.  I spoke to them about the possibility of mouth sores, diarrhea, possible doing constipation, fatigue, rashes. I told her that she must wear sunscreen and protector skin during the summertime as chemotherapy will make her skin much more sun sensitive. Paranoid told her that she cannot have 3 big meals a day.  She is to have 5 or 6 small meals a day. I told her that she can have anything that she wants. She wants fresh fruits and vegetables, I think this would be okay.   She will need to have the chemotherapy education class. We will get this set up for later on this week.   I spent about 26 Ms. with she and 2 over daughters. I answered all their questions. I reviewed the pathology report from her lumpectomy.     [name], MD 6/11/20182:56 PM

## 2017-04-13 ENCOUNTER — Encounter: Payer: Self-pay | Admitting: *Deleted

## 2017-04-13 ENCOUNTER — Other Ambulatory Visit: Payer: Medicare Other

## 2017-04-13 LAB — VITAMIN D 25 HYDROXY (VIT D DEFICIENCY, FRACTURES): Vitamin D, 25-Hydroxy: 40.2 ng/mL (ref 30.0–100.0)

## 2017-04-14 ENCOUNTER — Other Ambulatory Visit: Payer: Self-pay | Admitting: *Deleted

## 2017-04-14 ENCOUNTER — Other Ambulatory Visit: Payer: Self-pay | Admitting: Hematology & Oncology

## 2017-04-14 DIAGNOSIS — Z171 Estrogen receptor negative status [ER-]: Secondary | ICD-10-CM

## 2017-04-14 DIAGNOSIS — C50212 Malignant neoplasm of upper-inner quadrant of left female breast: Secondary | ICD-10-CM

## 2017-04-14 MED ORDER — ONDANSETRON HCL 8 MG PO TABS: 8.0000 mg | ORAL_TABLET | Freq: Two times a day (BID) | ORAL | 1 refills | Status: DC | PRN

## 2017-04-14 MED ORDER — LORAZEPAM 0.5 MG PO TABS: 0.5000 mg | ORAL_TABLET | Freq: Four times a day (QID) | ORAL | 0 refills | Status: DC | PRN

## 2017-04-14 MED ORDER — PROCHLORPERAZINE MALEATE 10 MG PO TABS: 10.0000 mg | ORAL_TABLET | Freq: Four times a day (QID) | ORAL | 1 refills | Status: DC | PRN

## 2017-04-15 ENCOUNTER — Other Ambulatory Visit: Payer: Self-pay | Admitting: *Deleted

## 2017-04-15 DIAGNOSIS — C50212 Malignant neoplasm of upper-inner quadrant of left female breast: Secondary | ICD-10-CM

## 2017-04-15 DIAGNOSIS — Z171 Estrogen receptor negative status [ER-]: Secondary | ICD-10-CM

## 2017-04-19 ENCOUNTER — Encounter: Payer: Self-pay | Admitting: *Deleted

## 2017-04-19 NOTE — Progress Notes (Signed)
[facility] Psychosocial Distress Screening Clinical Social Work  Clinical Social Work was referred by distress screening protocol.  The patient scored a 8 on the Psychosocial Distress Thermometer which indicates severe distress. Clinical Social Worker attempted to contact patient by phone to assess for distress and other psychosocial needs. CSW left voicemail to return call.  ON[facility] DISTRESS SCREENING 04/07/2017  Screening Type Initial Screening  Distress experienced in past week (1-10) 8  Family Problem type Partner  Emotional problem type Adjusting to illness  Spiritual/Religous concerns type Relating to God  Information Concerns Type   Physical Problem type Sleep/insomnia  Physician notified of physical symptoms   Referral to clinical psychology   Referral to clinical social work   Referral to dietition   Referral to financial advocate   Referral to support programs   Referral to palliative care    [place], MSW, LCSW, OSW-C Clinical Social Worker [place] [phone]

## 2017-04-22 ENCOUNTER — Other Ambulatory Visit: Payer: Medicare Other

## 2017-04-22 ENCOUNTER — Ambulatory Visit: Payer: Medicare Other | Admitting: Hematology & Oncology

## 2017-04-22 ENCOUNTER — Telehealth: Payer: Self-pay | Admitting: *Deleted

## 2017-04-22 NOTE — Telephone Encounter (Signed)
Patient's daughter calling the office because her mother's LEFT arm has some mild swelling. This is the arm in which she had a lymph node dissection approx one month ago. She wants to know what she should do. They are currently vacationing at the beach.   Reviewed with Dr Marin Olp and he would like the daughter to call the patient's surgeon.  Reviewed with the daughter to keep the arm elevated and that likely this was some degree of lymphedema. The patient is to rest and keep arm elevated with simple ROM exercises. She knows to seek emergency care of the swelling increases, the skin becomes taught or the patient has sensation changes to the limb. [name] will also call the surgeon.

## 2017-04-26 ENCOUNTER — Ambulatory Visit (HOSPITAL_BASED_OUTPATIENT_CLINIC_OR_DEPARTMENT_OTHER): Payer: Medicare Other

## 2017-04-26 ENCOUNTER — Ambulatory Visit: Payer: Medicare Other

## 2017-04-26 ENCOUNTER — Other Ambulatory Visit (HOSPITAL_BASED_OUTPATIENT_CLINIC_OR_DEPARTMENT_OTHER): Payer: Medicare Other

## 2017-04-26 VITALS — BP 124/66 | HR 68 | Resp 20

## 2017-04-26 VITALS — BP 129/52 | HR 72 | Temp 98.3°F | Resp 20 | Ht 61.0 in | Wt 173.4 lb

## 2017-04-26 DIAGNOSIS — Z5189 Encounter for other specified aftercare: Secondary | ICD-10-CM

## 2017-04-26 DIAGNOSIS — Z5111 Encounter for antineoplastic chemotherapy: Secondary | ICD-10-CM

## 2017-04-26 DIAGNOSIS — Z171 Estrogen receptor negative status [ER-]: Secondary | ICD-10-CM

## 2017-04-26 DIAGNOSIS — C50212 Malignant neoplasm of upper-inner quadrant of left female breast: Secondary | ICD-10-CM

## 2017-04-26 LAB — CMP (CANCER CENTER ONLY)
ALT(SGPT): 30 U/L (ref 10–47)
AST: 27 U/L (ref 11–38)
Albumin: 3.1 g/dL — ABNORMAL LOW (ref 3.3–5.5)
Alkaline Phosphatase: 58 U/L (ref 26–84)
BUN, Bld: 10 mg/dL (ref 7–22)
CO2: 29 mEq/L (ref 18–33)
Calcium: 9.2 mg/dL (ref 8.0–10.3)
Chloride: 96 mEq/L — ABNORMAL LOW (ref 98–108)
Creat: 0.7 mg/dl (ref 0.6–1.2)
Glucose, Bld: 157 mg/dL — ABNORMAL HIGH (ref 73–118)
Potassium: 3.1 mEq/L — ABNORMAL LOW (ref 3.3–4.7)
Sodium: 129 mEq/L (ref 128–145)
Total Bilirubin: 0.6 mg/dl (ref 0.20–1.60)
Total Protein: 7.1 g/dL (ref 6.4–8.1)

## 2017-04-26 LAB — CBC WITH DIFFERENTIAL (CANCER CENTER ONLY)
BASO#: 0 10*3/uL (ref 0.0–0.2)
BASO%: 0.1 % (ref 0.0–2.0)
EOS%: 0.1 % (ref 0.0–7.0)
Eosinophils Absolute: 0 10*3/uL (ref 0.0–0.5)
HCT: 32.3 % — ABNORMAL LOW (ref 34.8–46.6)
HGB: 10.9 g/dL — ABNORMAL LOW (ref 11.6–15.9)
LYMPH#: 0.7 10*3/uL — ABNORMAL LOW (ref 0.9–3.3)
LYMPH%: 5 % — ABNORMAL LOW (ref 14.0–48.0)
MCH: 32.6 pg (ref 26.0–34.0)
MCHC: 33.7 g/dL (ref 32.0–36.0)
MCV: 97 fL (ref 81–101)
MONO#: 0.6 10*3/uL (ref 0.1–0.9)
MONO%: 4.8 % (ref 0.0–13.0)
NEUT#: 11.8 10*3/uL — ABNORMAL HIGH (ref 1.5–6.5)
NEUT%: 90 % — ABNORMAL HIGH (ref 39.6–80.0)
Platelets: 251 10*3/uL (ref 145–400)
RBC: 3.34 10*6/uL — ABNORMAL LOW (ref 3.70–5.32)
RDW: 12.1 % (ref 11.1–15.7)
WBC: 13.1 10*3/uL — ABNORMAL HIGH (ref 3.9–10.0)

## 2017-04-26 MED ORDER — PEGFILGRASTIM 6 MG/0.6ML ~~LOC~~ PSKT
6.0000 mg | PREFILLED_SYRINGE | Freq: Once | SUBCUTANEOUS | Status: AC
  Administered 2017-04-26: 6 mg via SUBCUTANEOUS

## 2017-04-26 MED ORDER — PEGFILGRASTIM 6 MG/0.6ML ~~LOC~~ PSKT
PREFILLED_SYRINGE | SUBCUTANEOUS | Status: AC
  Filled 2017-04-26: qty 0.6

## 2017-04-26 MED ORDER — CARBOPLATIN CHEMO INJECTION 600 MG/60ML
400.0000 mg | Freq: Once | INTRAVENOUS | Status: AC
  Administered 2017-04-26: 400 mg via INTRAVENOUS
  Filled 2017-04-26: qty 40

## 2017-04-26 MED ORDER — DOCETAXEL CHEMO INJECTION 160 MG/16ML
75.0000 mg/m2 | Freq: Once | INTRAVENOUS | Status: AC
  Administered 2017-04-26: 140 mg via INTRAVENOUS
  Filled 2017-04-26: qty 14

## 2017-04-26 MED ORDER — DEXAMETHASONE SODIUM PHOSPHATE 10 MG/ML IJ SOLN
INTRAMUSCULAR | Status: AC
  Filled 2017-04-26: qty 1

## 2017-04-26 MED ORDER — SODIUM CHLORIDE 0.9% FLUSH
10.0000 mL | INTRAVENOUS | Status: DC | PRN
  Administered 2017-04-26: 10 mL
  Filled 2017-04-26: qty 10

## 2017-04-26 MED ORDER — PALONOSETRON HCL INJECTION 0.25 MG/5ML
INTRAVENOUS | Status: AC
  Filled 2017-04-26: qty 5

## 2017-04-26 MED ORDER — DEXAMETHASONE SODIUM PHOSPHATE 10 MG/ML IJ SOLN
10.0000 mg | Freq: Once | INTRAMUSCULAR | Status: AC
  Administered 2017-04-26: 10 mg via INTRAVENOUS

## 2017-04-26 MED ORDER — PALONOSETRON HCL INJECTION 0.25 MG/5ML
0.2500 mg | Freq: Once | INTRAVENOUS | Status: AC
  Administered 2017-04-26: 0.25 mg via INTRAVENOUS

## 2017-04-26 MED ORDER — SODIUM CHLORIDE 0.9 % IV SOLN
Freq: Once | INTRAVENOUS | Status: AC
  Administered 2017-04-26: 09:00:00 via INTRAVENOUS

## 2017-04-26 MED ORDER — HEPARIN SOD (PORK) LOCK FLUSH 100 UNIT/ML IV SOLN
500.0000 [IU] | Freq: Once | INTRAVENOUS | Status: AC | PRN
  Administered 2017-04-26: 500 [IU]
  Filled 2017-04-26: qty 5

## 2017-04-26 NOTE — Patient Instructions (Signed)
Implanted Port Home Guide An implanted port is a type of central line that is placed under the skin. Central lines are used to provide IV access when treatment or nutrition needs to be given through a person's veins. Implanted ports are used for long-term IV access. An implanted port may be placed because:  You need IV medicine that would be irritating to the small veins in your hands or arms.  You need long-term IV medicines, such as antibiotics.  You need IV nutrition for a long period.  You need frequent blood draws for lab tests.  You need dialysis.  Implanted ports are usually placed in the chest area, but they can also be placed in the upper arm, the abdomen, or the leg. An implanted port has two main parts:  Reservoir. The reservoir is round and will appear as a small, raised area under your skin. The reservoir is the part where a needle is inserted to give medicines or draw blood.  Catheter. The catheter is a thin, flexible tube that extends from the reservoir. The catheter is placed into a large vein. Medicine that is inserted into the reservoir goes into the catheter and then into the vein.  How will I care for my incision site? Do not get the incision site wet. Bathe or shower as directed by your health care provider. How is my port accessed? Special steps must be taken to access the port:  Before the port is accessed, a numbing cream can be placed on the skin. This helps numb the skin over the port site.  Your health care provider uses a sterile technique to access the port. ? Your health care provider must put on a mask and sterile gloves. ? The skin over your port is cleaned carefully with an antiseptic and allowed to dry. ? The port is gently pinched between sterile gloves, and a needle is inserted into the port.  Only "non-coring" port needles should be used to access the port. Once the port is accessed, a blood return should be checked. This helps ensure that the port  is in the vein and is not clogged.  If your port needs to remain accessed for a constant infusion, a clear (transparent) bandage will be placed over the needle site. The bandage and needle will need to be changed every week, or as directed by your health care provider.  Keep the bandage covering the needle clean and dry. Do not get it wet. Follow your health care provider's instructions on how to take a shower or bath while the port is accessed.  If your port does not need to stay accessed, no bandage is needed over the port.  What is flushing? Flushing helps keep the port from getting clogged. Follow your health care provider's instructions on how and when to flush the port. Ports are usually flushed with saline solution or a medicine called heparin. The need for flushing will depend on how the port is used.  If the port is used for intermittent medicines or blood draws, the port will need to be flushed: ? After medicines have been given. ? After blood has been drawn. ? As part of routine maintenance.  If a constant infusion is running, the port may not need to be flushed.  How long will my port stay implanted? The port can stay in for as long as your health care provider thinks it is needed. When it is time for the port to come out, surgery will be   done to remove it. The procedure is similar to the one performed when the port was put in. When should I seek immediate medical care? When you have an implanted port, you should seek immediate medical care if:  You notice a bad smell coming from the incision site.  You have swelling, redness, or drainage at the incision site.  You have more swelling or pain at the port site or the surrounding area.  You have a fever that is not controlled with medicine.  This information is not intended to replace advice given to you by your health care provider. Make sure you discuss any questions you have with your health care provider. Document  Released: 10/19/2005 Document Revised: 03/26/2016 Document Reviewed: 06/26/2013 Elsevier Interactive Patient Education  2017 Elsevier Inc[employer]  

## 2017-04-26 NOTE — Patient Instructions (Signed)
[facility] Discharge Instructions for Patients Receiving Chemotherapy  Today you received the following chemotherapy agents:  Taxotere and Carboplatain  To help prevent nausea and vomiting after your treatment, we encourage you to take your nausea medications as directed on the bottle. They have been called into your pharmacy.   If you develop nausea and vomiting that is not controlled by your nausea medication, call the clinic.   BELOW ARE SYMPTOMS THAT SHOULD BE REPORTED IMMEDIATELY:  *FEVER GREATER THAN 100.5 F  *CHILLS WITH OR WITHOUT FEVER  NAUSEA AND VOMITING THAT IS NOT CONTROLLED WITH YOUR NAUSEA MEDICATION  *UNUSUAL SHORTNESS OF BREATH  *UNUSUAL BRUISING OR BLEEDING  TENDERNESS IN MOUTH AND THROAT WITH OR WITHOUT PRESENCE OF ULCERS  *URINARY PROBLEMS  *BOWEL PROBLEMS  UNUSUAL RASH Items with * indicate a potential emergency and should be followed up as soon as possible.  Feel free to call the clinic you have any questions or concerns. The clinic phone number is [phone].  Please show the [facility] at check-in to the Emergency Department and triage nurse. Carboplatin injection What is this medicine? CARBOPLATIN (KAR boe pla tin) is a chemotherapy drug. It targets fast dividing cells, like cancer cells, and causes these cells to die. This medicine is used to treat ovarian cancer and many other cancers. This medicine may be used for other purposes; ask your health care provider or pharmacist if you have questions. COMMON BRAND NAME(S): Paraplatin What should I tell my health care provider before I take this medicine? They need to know if you have any of these conditions: -blood disorders -hearing problems -kidney disease -recent or ongoing radiation therapy -an unusual or allergic reaction to carboplatin, cisplatin, other chemotherapy, other medicines, foods, dyes, or preservatives -pregnant or trying to get pregnant -breast-feeding How  should I use this medicine? This drug is usually given as an infusion into a vein. It is administered in a hospital or clinic by a specially trained health care professional. Talk to your pediatrician regarding the use of this medicine in children. Special care may be needed. Overdosage: If you think you have taken too much of this medicine contact a poison control center or emergency room at once. NOTE: This medicine is only for you. Do not share this medicine with others. What if I miss a dose? It is important not to miss a dose. Call your doctor or health care professional if you are unable to keep an appointment. What may interact with this medicine? -medicines for seizures -medicines to increase blood counts like filgrastim, pegfilgrastim, sargramostim -some antibiotics like amikacin, gentamicin, neomycin, streptomycin, tobramycin -vaccines Talk to your doctor or health care professional before taking any of these medicines: -acetaminophen -aspirin -ibuprofen -ketoprofen -naproxen This list may not describe all possible interactions. Give your health care provider a list of all the medicines, herbs, non-prescription drugs, or dietary supplements you use. Also tell them if you smoke, drink alcohol, or use illegal drugs. Some items may interact with your medicine. What should I watch for while using this medicine? Your condition will be monitored carefully while you are receiving this medicine. You will need important blood work done while you are taking this medicine. This drug may make you feel generally unwell. This is not uncommon, as chemotherapy can affect healthy cells as well as cancer cells. Report any side effects. Continue your course of treatment even though you feel ill unless your doctor tells you to stop. In some cases, you may  be given additional medicines to help with side effects. Follow all directions for their use. Call your doctor or health care professional for advice  if you get a fever, chills or sore throat, or other symptoms of a cold or flu. Do not treat yourself. This drug decreases your body's ability to fight infections. Try to avoid being around people who are sick. This medicine may increase your risk to bruise or bleed. Call your doctor or health care professional if you notice any unusual bleeding. Be careful brushing and flossing your teeth or using a toothpick because you may get an infection or bleed more easily. If you have any dental work done, tell your dentist you are receiving this medicine. Avoid taking products that contain aspirin, acetaminophen, ibuprofen, naproxen, or ketoprofen unless instructed by your doctor. These medicines may hide a fever. Do not become pregnant while taking this medicine. Women should inform their doctor if they wish to become pregnant or think they might be pregnant. There is a potential for serious side effects to an unborn child. Talk to your health care professional or pharmacist for more information. Do not breast-feed an infant while taking this medicine. What side effects may I notice from receiving this medicine? Side effects that you should report to your doctor or health care professional as soon as possible: -allergic reactions like skin rash, itching or hives, swelling of the face, lips, or tongue -signs of infection - fever or chills, cough, sore throat, pain or difficulty passing urine -signs of decreased platelets or bleeding - bruising, pinpoint red spots on the skin, black, tarry stools, nosebleeds -signs of decreased red blood cells - unusually weak or tired, fainting spells, lightheadedness -breathing problems -changes in hearing -changes in vision -chest pain -high blood pressure -low blood counts - This drug may decrease the number of white blood cells, red blood cells and platelets. You may be at increased risk for infections and bleeding. -nausea and vomiting -pain, swelling, redness or  irritation at the injection site -pain, tingling, numbness in the hands or feet -problems with balance, talking, walking -trouble passing urine or change in the amount of urine Side effects that usually do not require medical attention (report to your doctor or health care professional if they continue or are bothersome): -hair loss -loss of appetite -metallic taste in the mouth or changes in taste This list may not describe all possible side effects. Call your doctor for medical advice about side effects. You may report side effects to FDA at 1-800-FDA-1088. Where should I keep my medicine? This drug is given in a hospital or clinic and will not be stored at home. NOTE: This sheet is a summary. It may not cover all possible information. If you have questions about this medicine, talk to your doctor, pharmacist, or health care provider.  2018 Elsevier/Gold Standard (2008-01-24 14:38:05) Docetaxel injection What is this medicine? DOCETAXEL (doe se TAX el) is a chemotherapy drug. It targets fast dividing cells, like cancer cells, and causes these cells to die. This medicine is used to treat many types of cancers like breast cancer, certain stomach cancers, head and neck cancer, lung cancer, and prostate cancer. This medicine may be used for other purposes; ask your health care provider or pharmacist if you have questions. COMMON BRAND NAME(S): Docefrez, Taxotere What should I tell my health care provider before I take this medicine? They need to know if you have any of these conditions: -infection (especially a virus infection such as chickenpox,  cold sores, or herpes) -liver disease -low blood counts, like low white cell, platelet, or red cell counts -an unusual or allergic reaction to docetaxel, polysorbate 80, other chemotherapy agents, other medicines, foods, dyes, or preservatives -pregnant or trying to get pregnant -breast-feeding How should I use this medicine? This drug is given as  an infusion into a vein. It is administered in a hospital or clinic by a specially trained health care professional. Talk to your pediatrician regarding the use of this medicine in children. Special care may be needed. Overdosage: If you think you have taken too much of this medicine contact a poison control center or emergency room at once. NOTE: This medicine is only for you. Do not share this medicine with others. What if I miss a dose? It is important not to miss your dose. Call your doctor or health care professional if you are unable to keep an appointment. What may interact with this medicine? -cyclosporine -erythromycin -ketoconazole -medicines to increase blood counts like filgrastim, pegfilgrastim, sargramostim -vaccines Talk to your doctor or health care professional before taking any of these medicines: -acetaminophen -aspirin -ibuprofen -ketoprofen -naproxen This list may not describe all possible interactions. Give your health care provider a list of all the medicines, herbs, non-prescription drugs, or dietary supplements you use. Also tell them if you smoke, drink alcohol, or use illegal drugs. Some items may interact with your medicine. What should I watch for while using this medicine? Your condition will be monitored carefully while you are receiving this medicine. You will need important blood work done while you are taking this medicine. This drug may make you feel generally unwell. This is not uncommon, as chemotherapy can affect healthy cells as well as cancer cells. Report any side effects. Continue your course of treatment even though you feel ill unless your doctor tells you to stop. In some cases, you may be given additional medicines to help with side effects. Follow all directions for their use. Call your doctor or health care professional for advice if you get a fever, chills or sore throat, or other symptoms of a cold or flu. Do not treat yourself. This drug  decreases your body's ability to fight infections. Try to avoid being around people who are sick. This medicine may increase your risk to bruise or bleed. Call your doctor or health care professional if you notice any unusual bleeding. This medicine may contain alcohol in the product. You may get drowsy or dizzy. Do not drive, use machinery, or do anything that needs mental alertness until you know how this medicine affects you. Do not stand or sit up quickly, especially if you are an older patient. This reduces the risk of dizzy or fainting spells. Avoid alcoholic drinks. Do not become pregnant while taking this medicine. Women should inform their doctor if they wish to become pregnant or think they might be pregnant. There is a potential for serious side effects to an unborn child. Talk to your health care professional or pharmacist for more information. Do not breast-feed an infant while taking this medicine. What side effects may I notice from receiving this medicine? Side effects that you should report to your doctor or health care professional as soon as possible: -allergic reactions like skin rash, itching or hives, swelling of the face, lips, or tongue -low blood counts - This drug may decrease the number of white blood cells, red blood cells and platelets. You may be at increased risk for infections and bleeding. -signs  of infection - fever or chills, cough, sore throat, pain or difficulty passing urine -signs of decreased platelets or bleeding - bruising, pinpoint red spots on the skin, black, tarry stools, nosebleeds -signs of decreased red blood cells - unusually weak or tired, fainting spells, lightheadedness -breathing problems -fast or irregular heartbeat -low blood pressure -mouth sores -nausea and vomiting -pain, swelling, redness or irritation at the injection site -pain, tingling, numbness in the hands or feet -swelling of the ankle, feet, hands -weight gain Side effects that  usually do not require medical attention (report to your doctor or health care professional if they continue or are bothersome): -bone pain -complete hair loss including hair on your head, underarms, pubic hair, eyebrows, and eyelashes -diarrhea -excessive tearing -changes in the color of fingernails -loosening of the fingernails -nausea -muscle pain -red flush to skin -sweating -weak or tired This list may not describe all possible side effects. Call your doctor for medical advice about side effects. You may report side effects to FDA at 1-800-FDA-1088. Where should I keep my medicine? This drug is given in a hospital or clinic and will not be stored at home. NOTE: This sheet is a summary. It may not cover all possible information. If you have questions about this medicine, talk to your doctor, pharmacist, or health care provider.  2018 Elsevier/Gold Standard (2015-11-21 12:32:56)

## 2017-04-27 ENCOUNTER — Emergency Department (HOSPITAL_COMMUNITY)
Admission: EM | Admit: 2017-04-27 | Discharge: 2017-04-27 | Disposition: A | Discharge status: Home / Self Care | Payer: Medicare Other | Attending: Emergency Medicine | Admitting: Emergency Medicine

## 2017-04-27 ENCOUNTER — Encounter (HOSPITAL_COMMUNITY): Payer: Self-pay

## 2017-04-27 DIAGNOSIS — C50212 Malignant neoplasm of upper-inner quadrant of left female breast: Secondary | ICD-10-CM | POA: Diagnosis not present

## 2017-04-27 DIAGNOSIS — Z79899 Other long term (current) drug therapy: Secondary | ICD-10-CM | POA: Insufficient documentation

## 2017-04-27 DIAGNOSIS — J45909 Unspecified asthma, uncomplicated: Secondary | ICD-10-CM | POA: Insufficient documentation

## 2017-04-27 DIAGNOSIS — E039 Hypothyroidism, unspecified: Secondary | ICD-10-CM | POA: Diagnosis not present

## 2017-04-27 DIAGNOSIS — L02412 Cutaneous abscess of left axilla: Secondary | ICD-10-CM | POA: Diagnosis not present

## 2017-04-27 DIAGNOSIS — Z5189 Encounter for other specified aftercare: Secondary | ICD-10-CM

## 2017-04-27 DIAGNOSIS — N189 Chronic kidney disease, unspecified: Secondary | ICD-10-CM | POA: Diagnosis not present

## 2017-04-27 DIAGNOSIS — I129 Hypertensive chronic kidney disease with stage 1 through stage 4 chronic kidney disease, or unspecified chronic kidney disease: Secondary | ICD-10-CM | POA: Insufficient documentation

## 2017-04-27 DIAGNOSIS — Z48817 Encounter for surgical aftercare following surgery on the skin and subcutaneous tissue: Secondary | ICD-10-CM | POA: Diagnosis not present

## 2017-04-27 LAB — CBC WITH DIFFERENTIAL/PLATELET
Basophils Absolute: 0 10*3/uL (ref 0.0–0.1)
Basophils Relative: 0 %
Eosinophils Absolute: 0 10*3/uL (ref 0.0–0.7)
Eosinophils Relative: 0 %
HCT: 33.8 % — ABNORMAL LOW (ref 36.0–46.0)
Hemoglobin: 11.4 g/dL — ABNORMAL LOW (ref 12.0–15.0)
Lymphocytes Relative: 3 %
Lymphs Abs: 0.5 10*3/uL — ABNORMAL LOW (ref 0.7–4.0)
MCH: 31.8 pg (ref 26.0–34.0)
MCHC: 33.7 g/dL (ref 30.0–36.0)
MCV: 94.4 fL (ref 78.0–100.0)
Monocytes Absolute: 0.8 10*3/uL (ref 0.1–1.0)
Monocytes Relative: 5 %
Neutro Abs: 15.3 10*3/uL — ABNORMAL HIGH (ref 1.7–7.7)
Neutrophils Relative %: 92 %
Platelets: 282 10*3/uL (ref 150–400)
RBC: 3.58 MIL/uL — ABNORMAL LOW (ref 3.87–5.11)
RDW: 12.7 % (ref 11.5–15.5)
WBC: 16.6 10*3/uL — ABNORMAL HIGH (ref 4.0–10.5)

## 2017-04-27 LAB — BASIC METABOLIC PANEL
Anion gap: 11 (ref 5–15)
BUN: 15 mg/dL (ref 6–20)
CO2: 28 mmol/L (ref 22–32)
Calcium: 9 mg/dL (ref 8.9–10.3)
Chloride: 98 mmol/L — ABNORMAL LOW (ref 101–111)
Creatinine, Ser: 0.6 mg/dL (ref 0.44–1.00)
GFR calc Af Amer: >60 mL/min (ref >60)
GFR calc non Af Amer: >60 mL/min (ref >60)
Glucose, Bld: 117 mg/dL — ABNORMAL HIGH (ref 65–99)
Potassium: 3.2 mmol/L — ABNORMAL LOW (ref 3.5–5.1)
Sodium: 137 mmol/L (ref 135–145)

## 2017-04-27 LAB — I-STAT CG4 LACTIC ACID, ED: Lactic Acid, Venous: 1.16 mmol/L (ref 0.5–1.9)

## 2017-04-27 MED ORDER — LIDOCAINE HCL 1 % IJ SOLN
INTRAMUSCULAR | Status: AC
  Administered 2017-04-27: 10:00:00
  Filled 2017-04-27: qty 20

## 2017-04-27 MED ORDER — DOXYCYCLINE HYCLATE 100 MG PO TABS: 100.0000 mg | ORAL_TABLET | Freq: Two times a day (BID) | ORAL | 0 refills | Status: DC

## 2017-04-27 MED ORDER — SODIUM CHLORIDE 0.9 % IV BOLUS (SEPSIS)
500.0000 mL | Freq: Once | INTRAVENOUS | Status: AC
  Administered 2017-04-27: 500 mL via INTRAVENOUS

## 2017-04-27 MED ORDER — DOXYCYCLINE HYCLATE 100 MG PO TABS: 100.0000 mg | ORAL_TABLET | Freq: Two times a day (BID) | ORAL | 0 refills | Status: AC

## 2017-04-27 NOTE — ED Triage Notes (Signed)
Pt has her first round of chemo today and three lymph nodes removed from under her left arm, she woke up and her wound is draining copius amounts of brown drainage

## 2017-04-27 NOTE — Progress Notes (Signed)
Assessment Left axilla wound infection-drained and packed.  S/p left partial mastectomy and left axillary SLNBx (Dr. Brantley Stage) 03/26/17   Plan:  Discharge from ED on oral abxs.  Will arrange for [facility] for daily wet to dry dressing changes.  Follow up with Dr. Brantley Stage next week.   LOS: 0 days        Chief Complaint/Subjective: She has been having increasing pain in left axillary area.  She had chemotherapy yesterday and then began having brownish drainage from left axillary wound.  She presented to ED for evaluation.  Daughter at bedside with her.  Objective: Vital signs in last 24 hours: Temp:  [97.7 F (36.5 C)-97.9 F (36.6 C)] 97.9 F (36.6 C) (06/26 0655) Pulse Rate:  [66-88] 68 (06/26 0916) Resp:  [16-20] 16 (06/26 0916) BP: (116-136)/(45-76) 136/64 (06/26 0916) SpO2:  [93 %-100 %] 93 % (06/26 0916) Weight:  [79.4 kg (175 lb)] 79.4 kg (175 lb) (06/26 0437)    Intake/Output from previous day: No intake/output data recorded. Intake/Output this shift: No intake/output data recorded.  PE: General- In NAD.  Awake and alert. Left Breast-incision is clean and intact Left axilla-erythema and tenderness around incision with purulent drainage coming from a punctate hole in incision.  The wound was prepped with Betadine, anesthetized with Xylocaine and then opened.  Purulent fluid was evacuated.  The wound was packed.  Lab Results:   Recent Labs  04/26/17 0827 04/27/17 0655  WBC 13.1* 16.6*  HGB 10.9* 11.4*  HCT 32.3* 33.8*  PLT 251 282   BMET  Recent Labs  04/26/17 0827 04/27/17 0655  NA 129 137  K 3.1* 3.2*  CL 96* 98*  CO2 29 28  GLUCOSE 157* 117*  BUN 10 15  CREATININE 0.7 0.60  CALCIUM 9.2 9.0   PT/INR No results for input(s): LABPROT, INR in the last 72 hours. Comprehensive Metabolic Panel:    Component Value Date/Time   NA 137 04/27/2017 0655   NA 129 04/26/2017 0827   NA 139 04/12/2017 1043   NA 139 03/18/2017 1206   K 3.2 (L) 04/27/2017 0655    K 3.1 (L) 04/26/2017 0827   K 3.9 04/12/2017 1043   K 3.9 03/18/2017 1206   CL 98 (L) 04/27/2017 0655   CL 96 (L) 04/26/2017 0827   CL 103 03/18/2017 1206   CO2 28 04/27/2017 0655   CO2 29 04/26/2017 0827   CO2 28 04/12/2017 1043   CO2 27 03/18/2017 1206   BUN 15 04/27/2017 0655   BUN 10 04/26/2017 0827   BUN 12.3 04/12/2017 1043   BUN 11 03/18/2017 1206   CREATININE 0.60 04/27/2017 0655   CREATININE 0.7 04/26/2017 0827   CREATININE 0.8 04/12/2017 1043   CREATININE 0.77 03/18/2017 1206   GLUCOSE 117 (H) 04/27/2017 0655   GLUCOSE 157 (H) 04/26/2017 0827   GLUCOSE 120 04/12/2017 1043   GLUCOSE 107 (H) 03/18/2017 1206   CALCIUM 9.0 04/27/2017 0655   CALCIUM 9.2 04/26/2017 0827   CALCIUM 9.8 04/12/2017 1043   CALCIUM 9.3 03/18/2017 1206   AST 27 04/26/2017 0827   AST 15 04/12/2017 1043   AST 25 03/18/2017 1206   ALT 30 04/26/2017 0827   ALT 13 04/12/2017 1043   ALT 17 03/18/2017 1206   ALKPHOS 58 04/26/2017 0827   ALKPHOS 61 04/12/2017 1043   ALKPHOS 56 03/18/2017 1206   BILITOT 0.60 04/26/2017 0827   BILITOT 0.27 04/12/2017 1043   BILITOT 0.3 03/18/2017 1206   PROT 7.1 04/26/2017  0827   PROT 6.9 04/12/2017 1043   PROT 6.8 03/18/2017 1206   ALBUMIN 3.1 (L) 04/26/2017 0827   ALBUMIN 3.5 04/12/2017 1043   ALBUMIN 3.8 03/18/2017 1206     Studies/Results: No results found.  Anti-infectives: Anti-infectives    None       Jessica [name] 04/27/2017

## 2017-04-27 NOTE — Discharge Instructions (Signed)
Take antibiotic twice a day as prescribed.  Home health nurses will help you with dressing changes.  If you have not heard from them by tomorrow at 10:00 am, please call the office.  Office will call you regarding an appointment with Dr. Brantley Stage.  No lifting over 5 pounds with left arm until you see Dr. Brantley Stage.

## 2017-04-27 NOTE — ED Provider Notes (Signed)
Care assumed from Dr. Betsey Holiday.   At time of transfer care, patient is awaiting assessment by surgical team to provide recommendations for possible wound infection. Anticipate following up on the recommendations.  Surgical team did a drainage of the wound. Surgical team prescribed patient doxycycline for management of the wound. They will follow-up with the patient in clinic.   Patient understood return precautions and follow-up instructions prior to discharge.  Nursing called to report that patient arrived at the pharmacy and did not receive the prescription as written by surgery.   I attempted to re-sent electronic prescription of doxycycline for the patient.    Clinical Impression: 1. Wound check, abscess     Disposition: Discharge  Condition: Good  I have discussed the results, Dx and Tx plan with the pt(& family if present). He/she/they expressed understanding and agree(s) with the plan. Discharge instructions discussed at great length. Strict return precautions discussed and pt &/or family have verbalized understanding of the instructions. No further questions at time of discharge.    Discharge Medication List as of 04/27/2017 11:36 AM    START taking these medications   Details  doxycycline (VIBRA-TABS) 100 MG tablet Take 1 tablet (100 mg total) by mouth 2 (two) times daily., Starting Tue 04/27/2017, Print        Follow Up: [address], [place] [address] [phone]  Schedule an appointment as soon as possible for a visit        Tegeler, Gwenyth Allegra, MD 04/27/17 [phone]

## 2017-04-27 NOTE — ED Provider Notes (Signed)
[facility] DEPT Provider Note   CSN: [id number] Arrival date & time: 04/27/17  0425     History   Chief Complaint Chief Complaint  Patient presents with  . Wound Check    HPI Jessica [name] is a 76 y.o. female.  Patient presents to the emergency department for evaluation of drainage from her left axilla. Patient currently undergoing treatment for breast cancer. She had lumpectomy and lymph node removal on May 22. She reports that she had been recovering well after the surgery, but always had a small lump at the scar. She reports that over the last day or so lump enlarged and then tonight started draining large amounts of grayish brown fluid. She has not had any fever.      Past Medical History:  Diagnosis Date  . Allergy   . Anemia   . Anxiety   . Asthma    in past, no inhalers now  . Blood transfusion without reported diagnosis   . Breast cancer ([facility]) 01/2017   left breast   . Breast cancer of upper-inner quadrant of left female breast ([facility]) 03/11/2017  . Cancer ([facility]) 01/2017   left breast  . Cataract    bilateral removed  . Chronic kidney disease    kidney stones  . Depression 08/31/2013  . Dyslipidemia, goal LDL below 160 08/31/2013  . Essential hypertension - well controlled 08/31/2013  . GERD (gastroesophageal reflux disease)   . Goals of care, counseling/discussion 03/11/2017  . Hypothyroidism   . Obesity (BMI 30-39.9) 08/31/2013  . Thyroid disease   . Vasovagal near-syncope -rare 08/31/2013    Patient Active Problem List   Diagnosis Date Noted  . Breast cancer of upper-inner quadrant of left female breast ([facility]) 03/11/2017  . Goals of care, counseling/discussion 03/11/2017  . Obesity (BMI 30-39.9) 08/31/2013  . Essential hypertension - well controlled 08/31/2013    Class: Diagnosis of  . Depression 08/31/2013    Class: Diagnosis of  . Vasovagal near-syncope -rare 08/31/2013    Class: History of  . Dyslipidemia, goal LDL below 160 08/31/2013     Past Surgical History:  Procedure Laterality Date  . ABDOMINAL HYSTERECTOMY     total  . BREAST BIOPSY Left 01/2017    malignant  . broken fingers    . CATARACT EXTRACTION Bilateral   . COLONOSCOPY    . EXCISIONAL HEMORRHOIDECTOMY    . kidney stones lithotripsy    . PORTACATH PLACEMENT Right 03/23/2017   Procedure: INSERTION PORT-A-CATH;  Surgeon: Erroll Luna, MD;  Location: [facility];  Service: General;  Laterality: Right;  . RADIOACTIVE SEED GUIDED MASTECTOMY WITH AXILLARY SENTINEL LYMPH NODE BIOPSY Left 03/23/2017   Procedure: LEFT BREAST RADIOACTIVE SEED GUIDED PARTIAL MASTECTOMY AND  SENTINEL LYMPH NODE MAPPING;  Surgeon: Erroll Luna, MD;  Location: [facility];  Service: General;  Laterality: Left;  . ROTATOR CUFF REPAIR     left   . TONSILLECTOMY    . WISDOM TOOTH EXTRACTION      OB History    No data available       Home Medications    Prior to Admission medications   Medication Sig Start Date End Date Taking? Authorizing Provider  benazepril (LOTENSIN) 20 MG tablet Take 20 mg by mouth daily. 07/19/13  Yes [provider]  calcium citrate-vitamin D (CITRACAL+D) 315-200 MG-UNIT per tablet Take 1 tablet by mouth daily.   Yes [provider]  celecoxib (CELEBREX) 100 MG capsule Take 1 capsule (100  mg total) by mouth 2 (two) times daily. 03/23/17  Yes Cornett, Marcello Moores, MD  dexamethasone (DECADRON) 4 MG tablet Take 2 pills with food every 12 hours for 4 days.  Start day before each chemotherapy session. 04/12/17  Yes Ennever, Rudell Cobb, MD  diphenhydramine-acetaminophen (TYLENOL PM) 25-500 MG TABS Take 1 tablet by mouth at bedtime as needed (sleep).    Yes [provider]  hydrochlorothiazide (MICROZIDE) 12.5 MG capsule Take 12.5 mg by mouth daily. 02/28/16  Yes [provider]  levothyroxine (SYNTHROID, LEVOTHROID) 75 MCG tablet Take 75 mcg by mouth daily. 07/19/13  Yes [provider]   lidocaine-prilocaine (EMLA) cream Apply 1 application topically as needed. 04/12/17  Yes Ennever, Rudell Cobb, MD  LORazepam (ATIVAN) 0.5 MG tablet Take 1 tablet (0.5 mg total) by mouth every 6 (six) hours as needed (Nausea or vomiting). 04/14/17  Yes Ennever, Rudell Cobb, MD  Lysine 1000 MG TABS Take 1,000 mg by mouth as needed.   Yes [provider]  Multiple Vitamin (MULTIVITAMIN) tablet Take 1 tablet by mouth daily.   Yes [provider]  omeprazole (PRILOSEC OTC) 20 MG tablet Take 20 mg by mouth daily.   Yes [provider]  ondansetron (ZOFRAN) 8 MG tablet Take 1 tablet (8 mg total) by mouth 2 (two) times daily as needed for refractory nausea / vomiting. Start on day 3 after chemo. 04/14/17  Yes Ennever, Rudell Cobb, MD  oxyCODONE (OXY IR/ROXICODONE) 5 MG immediate release tablet Take 1-2 tablets (5-10 mg total) by mouth every 6 (six) hours as needed for severe pain. 03/23/17  Yes Cornett, Marcello Moores, MD  prochlorperazine (COMPAZINE) 10 MG tablet TAKE 1 TABLET(10 MG) BY MOUTH EVERY 6 HOURS AS NEEDED FOR NAUSEA OR VOMITING 04/14/17  Yes Ennever, Rudell Cobb, MD  venlafaxine XR (EFFEXOR-XR) 75 MG 24 hr capsule Take 225 mg by mouth daily.   Yes [provider]  doxycycline (VIBRA-TABS) 100 MG tablet Take 1 tablet (100 mg total) by mouth 2 (two) times daily. 04/27/17 05/04/17  Tegeler, Gwenyth Allegra, MD    Family History Family History  Problem Relation Age of Onset  . Cervical cancer Maternal Aunt   . Colon cancer Neg Hx   . Pancreatic cancer Neg Hx   . Stomach cancer Neg Hx   . Esophageal cancer Neg Hx   . Rectal cancer Neg Hx     Social History Social History  Substance Use Topics  . Smoking status: Never Smoker  . Smokeless tobacco: Never Used  . Alcohol use No     Allergies   Codeine; Penicillins; Advil [ibuprofen]; and Band-aid plus antibiotic [bacitracin-polymyxin b]   Review of Systems Review of Systems  Constitutional: Negative for fever.  Skin: Positive  for wound.  All other systems reviewed and are negative.    Physical Exam Updated Vital Signs BP 127/63 (BP Location: Right Wrist)   Pulse 80   Temp 97.9 F (36.6 C) (Oral)   Resp 16   Ht 5\' 3"  (1.6 m)   Wt 79.4 kg (175 lb)   SpO2 98%   BMI 31.00 kg/m   Physical Exam  Constitutional: She is oriented to person, place, and time. She appears well-developed and well-nourished. No distress.  HENT:  Head: Normocephalic and atraumatic.  Right Ear: Hearing normal.  Left Ear: Hearing normal.  Nose: Nose normal.  Mouth/Throat: Oropharynx is clear and moist and mucous membranes are normal.  Eyes: Conjunctivae and EOM are normal. Pupils are equal, round, and reactive to  light.  Neck: Normal range of motion. Neck supple.  Cardiovascular: Regular rhythm, S1 normal and S2 normal.  Exam reveals no gallop and no friction rub.   No murmur heard. Pulmonary/Chest: Effort normal and breath sounds normal. No respiratory distress. She exhibits no tenderness.  Abdominal: Soft. Normal appearance and bowel sounds are normal. There is no hepatosplenomegaly. There is no tenderness. There is no rebound, no guarding, no tenderness at McBurney's point and negative Murphy's sign. No hernia.  Musculoskeletal: Normal range of motion.  Neurological: She is alert and oriented to person, place, and time. She has normal strength. No cranial nerve deficit or sensory deficit. Coordination normal. GCS eye subscore is 4. GCS verbal subscore is 5. GCS motor subscore is 6.  Skin: Skin is dry. No rash noted. No cyanosis.     Psychiatric: She has a normal mood and affect. Her speech is normal and behavior is normal. Thought content normal.  Nursing note and vitals reviewed.    ED Treatments / Results  Labs (all labs ordered are listed, but only abnormal results are displayed) Labs Reviewed  CBC WITH DIFFERENTIAL/PLATELET - Abnormal; Notable for the following:       Result Value   WBC 16.6 (*)    RBC 3.58 (*)     Hemoglobin 11.4 (*)    HCT 33.8 (*)    Neutro Abs 15.3 (*)    Lymphs Abs 0.5 (*)    All other components within normal limits  BASIC METABOLIC PANEL - Abnormal; Notable for the following:    Potassium 3.2 (*)    Chloride 98 (*)    Glucose, Bld 117 (*)    All other components within normal limits  AEROBIC/ANAEROBIC CULTURE (SURGICAL/DEEP WOUND)  I-STAT CG4 LACTIC ACID, ED    EKG  EKG Interpretation None       Radiology No results found.  Procedures Procedures (including critical care time)  Medications Ordered in ED Medications  sodium chloride 0.9 % bolus 500 mL (0 mLs Intravenous Stopped 04/27/17 0737)  lidocaine (XYLOCAINE) 1 % (with pres) injection (  Given by Other 04/27/17 1025)     Initial Impression / Assessment and Plan / ED Course  I have reviewed the triage vital signs and the nursing notes.  Pertinent labs & imaging results that were available during my care of the patient were reviewed by me and considered in my medical decision making (see chart for details).     Patient presents to the emergency department for evaluation of pain, redness, swelling and drainage from previous surgical incision. Patient reports that she had a small lump at the surgical incision since the procedure was performed, but did not think much about it. He was not painful or tender. Overnight, however, the area opened up and has been draining large amounts of pus. Examination reveals a small hole in the incision with purulent drainage. This has been cultured. Patient received chemotherapy yesterday, first dose. She has no evidence of neutropenia, actually has a leukocytosis, likely secondary to infection. She is not febrile. Lactic acid is 1.16. General surgery consulted to evaluate the patient for further management of surgical wound infection.  Final Clinical Impressions(s) / ED Diagnoses   Final diagnoses:  Wound check, abscess    New Prescriptions Discharge Medication List as of  04/27/2017 11:36 AM    START taking these medications   Details  doxycycline (VIBRA-TABS) 100 MG tablet Take 1 tablet (100 mg total) by mouth 2 (two) times daily., Starting Tue 04/27/2017,  Print         Orpah Greek, MD 05/02/17 [id number]

## 2017-04-27 NOTE — ED Notes (Signed)
Pt had chemo yesterday

## 2017-04-29 DIAGNOSIS — Z9012 Acquired absence of left breast and nipple: Secondary | ICD-10-CM | POA: Diagnosis not present

## 2017-04-29 DIAGNOSIS — D0512 Intraductal carcinoma in situ of left breast: Secondary | ICD-10-CM | POA: Diagnosis not present

## 2017-04-29 DIAGNOSIS — I10 Essential (primary) hypertension: Secondary | ICD-10-CM | POA: Diagnosis not present

## 2017-04-29 DIAGNOSIS — T814XXD Infection following a procedure, subsequent encounter: Secondary | ICD-10-CM | POA: Diagnosis not present

## 2017-05-01 DIAGNOSIS — T814XXD Infection following a procedure, subsequent encounter: Secondary | ICD-10-CM | POA: Diagnosis not present

## 2017-05-01 DIAGNOSIS — I10 Essential (primary) hypertension: Secondary | ICD-10-CM | POA: Diagnosis not present

## 2017-05-01 DIAGNOSIS — D0512 Intraductal carcinoma in situ of left breast: Secondary | ICD-10-CM | POA: Diagnosis not present

## 2017-05-01 DIAGNOSIS — Z9012 Acquired absence of left breast and nipple: Secondary | ICD-10-CM | POA: Diagnosis not present

## 2017-05-02 DIAGNOSIS — Z9012 Acquired absence of left breast and nipple: Secondary | ICD-10-CM | POA: Diagnosis not present

## 2017-05-02 DIAGNOSIS — T814XXD Infection following a procedure, subsequent encounter: Secondary | ICD-10-CM | POA: Diagnosis not present

## 2017-05-02 DIAGNOSIS — D0512 Intraductal carcinoma in situ of left breast: Secondary | ICD-10-CM | POA: Diagnosis not present

## 2017-05-02 DIAGNOSIS — I10 Essential (primary) hypertension: Secondary | ICD-10-CM | POA: Diagnosis not present

## 2017-05-02 LAB — AEROBIC/ANAEROBIC CULTURE W GRAM STAIN (SURGICAL/DEEP WOUND)

## 2017-05-02 LAB — AEROBIC/ANAEROBIC CULTURE (SURGICAL/DEEP WOUND)

## 2017-05-03 ENCOUNTER — Telehealth: Payer: Self-pay | Admitting: Emergency Medicine

## 2017-05-03 NOTE — Telephone Encounter (Signed)
Post ED Visit - Positive Culture Follow-up  Culture report reviewed by antimicrobial stewardship pharmacist:  []  Elenor Quinones, Pharm.D. []  Heide Guile, Pharm.D., BCPS AQ-ID [x]  Parks Neptune, Pharm.D., BCPS []  Alycia Rossetti, Pharm.D., BCPS []  [place], Pharm.D., BCPS, AAHIVP []  Legrand Como, Pharm.D., BCPS, AAHIVP []  Salome Arnt, PharmD, BCPS []  Dimitri Ped, PharmD, BCPS []  Vincenza Hews, PharmD, BCPS  Positive wound culture Treated with doxycycline, organism sensitive to the same and no further patient follow-up is required at this time.  Hazle Nordmann 05/03/2017, 11:14 AM

## 2017-05-04 ENCOUNTER — Telehealth: Payer: Self-pay | Admitting: *Deleted

## 2017-05-04 MED ORDER — PREDNISOLONE ACETATE 1 % OP SUSP: 2.0000 [drp] | Freq: Three times a day (TID) | OPHTHALMIC | 0 refills | Status: DC

## 2017-05-04 NOTE — Telephone Encounter (Signed)
Patient's daughter is notifying the office that patient is c/o eye inflammation with dryness, redness and itching. The eyelid is also red and inflamed.   Reviewed symptoms with Dr Marin Olp. He would like the patient to use steroid eye drops three times a day for 7 days. The patient has an appointment at the surgeons office today and Dr Marin Olp requests that the patient see if a physician will assess her once there.  Reviewed with patient's daughter to call the office/oncall if patient develops any open sores to the eye lid or forehead as this might be a sign of shingles. She understood the importance of assessing the eye for these signs.    Daughter aware of new prescription and pharmacy confirmed.

## 2017-05-05 DIAGNOSIS — T814XXD Infection following a procedure, subsequent encounter: Secondary | ICD-10-CM | POA: Diagnosis not present

## 2017-05-05 DIAGNOSIS — Z9012 Acquired absence of left breast and nipple: Secondary | ICD-10-CM | POA: Diagnosis not present

## 2017-05-05 DIAGNOSIS — I10 Essential (primary) hypertension: Secondary | ICD-10-CM | POA: Diagnosis not present

## 2017-05-05 DIAGNOSIS — B0230 Zoster ocular disease, unspecified: Secondary | ICD-10-CM | POA: Diagnosis not present

## 2017-05-05 DIAGNOSIS — D0512 Intraductal carcinoma in situ of left breast: Secondary | ICD-10-CM | POA: Diagnosis not present

## 2017-05-07 DIAGNOSIS — H04332 Acute lacrimal canaliculitis of left lacrimal passage: Secondary | ICD-10-CM | POA: Diagnosis not present

## 2017-05-07 DIAGNOSIS — I10 Essential (primary) hypertension: Secondary | ICD-10-CM | POA: Diagnosis not present

## 2017-05-07 DIAGNOSIS — D0512 Intraductal carcinoma in situ of left breast: Secondary | ICD-10-CM | POA: Diagnosis not present

## 2017-05-07 DIAGNOSIS — B0230 Zoster ocular disease, unspecified: Secondary | ICD-10-CM | POA: Diagnosis not present

## 2017-05-07 DIAGNOSIS — Z961 Presence of intraocular lens: Secondary | ICD-10-CM | POA: Diagnosis not present

## 2017-05-07 DIAGNOSIS — T814XXD Infection following a procedure, subsequent encounter: Secondary | ICD-10-CM | POA: Diagnosis not present

## 2017-05-07 DIAGNOSIS — Z9012 Acquired absence of left breast and nipple: Secondary | ICD-10-CM | POA: Diagnosis not present

## 2017-05-08 DIAGNOSIS — D0512 Intraductal carcinoma in situ of left breast: Secondary | ICD-10-CM | POA: Diagnosis not present

## 2017-05-08 DIAGNOSIS — Z9012 Acquired absence of left breast and nipple: Secondary | ICD-10-CM | POA: Diagnosis not present

## 2017-05-08 DIAGNOSIS — I10 Essential (primary) hypertension: Secondary | ICD-10-CM | POA: Diagnosis not present

## 2017-05-08 DIAGNOSIS — T814XXD Infection following a procedure, subsequent encounter: Secondary | ICD-10-CM | POA: Diagnosis not present

## 2017-05-09 DIAGNOSIS — Z9012 Acquired absence of left breast and nipple: Secondary | ICD-10-CM | POA: Diagnosis not present

## 2017-05-09 DIAGNOSIS — T814XXD Infection following a procedure, subsequent encounter: Secondary | ICD-10-CM | POA: Diagnosis not present

## 2017-05-09 DIAGNOSIS — D0512 Intraductal carcinoma in situ of left breast: Secondary | ICD-10-CM | POA: Diagnosis not present

## 2017-05-09 DIAGNOSIS — I10 Essential (primary) hypertension: Secondary | ICD-10-CM | POA: Diagnosis not present

## 2017-05-14 DIAGNOSIS — Z9012 Acquired absence of left breast and nipple: Secondary | ICD-10-CM | POA: Diagnosis not present

## 2017-05-14 DIAGNOSIS — D0512 Intraductal carcinoma in situ of left breast: Secondary | ICD-10-CM | POA: Diagnosis not present

## 2017-05-14 DIAGNOSIS — T814XXD Infection following a procedure, subsequent encounter: Secondary | ICD-10-CM | POA: Diagnosis not present

## 2017-05-14 DIAGNOSIS — I10 Essential (primary) hypertension: Secondary | ICD-10-CM | POA: Diagnosis not present

## 2017-05-14 DIAGNOSIS — H04332 Acute lacrimal canaliculitis of left lacrimal passage: Secondary | ICD-10-CM | POA: Diagnosis not present

## 2017-05-14 DIAGNOSIS — B0230 Zoster ocular disease, unspecified: Secondary | ICD-10-CM | POA: Diagnosis not present

## 2017-05-14 NOTE — Op Note (Signed)
Preoperative diagnosis: Stage I left breast cancer upper-outer quadrant   Postoperative diagnosis: Same   Procedure: Left breast seed localized lumpectomy with left axillary sentinel lymph node mapping deep  with methylene blue dye   Port placement right internal jugular with U/S and C arm guidance  Surgeon: Erroll Luna M.D.    Anesthesia: LMA with pectoral block anesthesia and local  EBL: 20 cc   Specimen: Left breast mass with clipp and seed to pathology and two  axillary sentinel node hot and blue    Drains: None   Clinical History and Indications: The patient is getting ready to begin chemotherapy for her cancer and has elected for breast conserving surgery for her breast cancer.  . She  needs a Port-A-Cath for venous access. Risk of bleeding, infection,  Collapse lung,  Death,  DVT,  Organ injury,  Mediastinal injury,  Injury to heart,  Injury to blood vessels,  Nerves,  Migration of catheter,  Embolization of catheter and the need for more surgery.    Patient presents for treatment of her left breast cancer. She has opted for breast conservation after lengthy discussion of treatment options to include breast conservation surgery and mastectomy and reconstruction. Risks, benefits and alternatives discussed with the patient.The procedure has been discussed with the patient. Alternatives to surgery have been discussed with the patient. Risks of surgery include bleeding, Infection, Seroma formation, death, and the need for further surgery. The patient understands and wishes to proceed.Sentinel lymph node mapping and dissection has been discussed with the patient. Risk of bleeding, Infection, Seroma formation, Additional procedures,, Shoulder weakness , Shoulder stiffness, Nerve and blood vessel injury and reaction to the mapping dyes have been discussed. Alternatives to surgery have been discussed with the patient. The patient agrees to proceed.    The procedure has been  discussed with the patient. Alternatives to surgery have been discussed with the patient.  Risks of surgery include bleeding,  Infection,  Seroma formation, death,  and the need for further surgery.   The patient understands and wishes to proceed.  Sentinel lymph node mapping and dissection has been discussed with the patient.  Risk of bleeding,  Infection,  Seroma formation,  Additional procedures,,  Shoulder weakness ,  Shoulder stiffness,  Nerve and blood vessel injury and reaction to the mapping dyes have been discussed.  Alternatives to surgery have been discussed with the patient.  The patient agrees to proceed.  Description of Procedure: I have seen the patient in the holding area and confirmed the plans for the procedure as noted above. I reviewed the risks and complications again and the patient has no further questions. She wishes to proceed.   The patient was then taken to the operating room. After satisfactory general  anesthesia had been obtained the upper chest and lower neck were prepped and draped as a sterile field.After induction of LMA anesthesia left breast was prepped and draped in a sterile fashion and 4 cc of methylene blue dye were injected in a subareolar position. Of note, patient had pectoral block by anesthesia prior to this The timeout was done.  The right internal jugular vein  was entered under U/S guidance using a 7.5 mmHz probe and venopuncture under ultrasound guidence  and the guidewire threaded into the superior vena cava right atrial area under fluoroscopic guidance. An incision was then made on the anterior chest wall and a subcutaneous pocket fashioned for the port reservoir.  The port tubing was then brought through a subcutaneous  tunnel from the port site to the guidewire site.  The port and catheter were attached, locked  and flushed. The catheter was measured and cut to appropriate length.The dilator and peel-away sheath were then advanced over the  guidewire while monitoring this with fluoroscopy. The guidewire and dilator were removed and the tubing threaded to approximately 21 cm. The peel-away sheath was then removed. The catheter aspirated and flushed easily. Using fluoroscopy the tip was in the superior vena cava right atrial junction area. It aspirated and flushed easily. That aspirated and flushed easily.  The reservoir was secured to the fascia with 1 sutures of 2-0 Prolene. A final check with fluoroscopy was done to make sure we had no kinks and good positioning of the tip of the catheter. Everything appeared to be okay. The catheter was aspirated, flushed with dilute heparin and then concentrated aqueous heparin.  The incision was then closed with interrupted 3-0 Vicryl, and 4-0 Monocryl subcuticular with Dermabond on the skin.  There were no operative complications. Estimated blood loss was minimal. All counts were correct. The patient tolerated the procedure well.        After induction of LMA anesthesia left breast was prepped and draped in a sterile fashion and 4 cc of methylene blue dye were injected in a subareolar position. Of note, patient had pectoral block by anesthesia prior to this. Neoprobe was used to identify the radioactive seen in the left upper-outer quadrant. Curvilinear incision made  Around superior part of nipple and dissection was carried around to excise all tissue around both the clip and seen. Radiograph showed the mass with gross negative margins. Both seed  and clip were in the specimen. Specimen sent to pathology.  Neoprobe was switched to the technetium sulfur colloid setting.  2 Hot spot identify the left axilla. Incision made in the inferior axillary hairline and dissection carried into the axilla. Hot and blue lymph node identified in the level 1 axillary region and this is in the deep axillary lymph node basin and this was  excised. Background counts approached 0. Wound was irrigated and  Surgicel was placed.The wound was  closed with 3-0 Vicryl and 4-0 Monocryl. Lumpectomy site closed in a similar fashion. Dermabond applied. All final counts sponge, needle instruments found to be correct at this point. Patient awoke, taken to recovery in satisfactory condition.    Turner Daniels, MD, FACS

## 2017-05-16 ENCOUNTER — Encounter (HOSPITAL_COMMUNITY): Payer: Self-pay

## 2017-05-16 ENCOUNTER — Inpatient Hospital Stay (HOSPITAL_COMMUNITY)
Admission: EM | Admit: 2017-05-16 | Discharge: 2017-05-21 | DRG: 603 | Disposition: A | Discharge status: Home / Self Care | Payer: Medicare Other | Attending: Internal Medicine | Admitting: Internal Medicine

## 2017-05-16 DIAGNOSIS — N6489 Other specified disorders of breast: Secondary | ICD-10-CM | POA: Diagnosis not present

## 2017-05-16 DIAGNOSIS — F418 Other specified anxiety disorders: Secondary | ICD-10-CM | POA: Diagnosis not present

## 2017-05-16 DIAGNOSIS — Z888 Allergy status to other drugs, medicaments and biological substances status: Secondary | ICD-10-CM

## 2017-05-16 DIAGNOSIS — Z171 Estrogen receptor negative status [ER-]: Secondary | ICD-10-CM | POA: Diagnosis not present

## 2017-05-16 DIAGNOSIS — T148XXA Other injury of unspecified body region, initial encounter: Secondary | ICD-10-CM | POA: Diagnosis not present

## 2017-05-16 DIAGNOSIS — Z9221 Personal history of antineoplastic chemotherapy: Secondary | ICD-10-CM

## 2017-05-16 DIAGNOSIS — Z87442 Personal history of urinary calculi: Secondary | ICD-10-CM

## 2017-05-16 DIAGNOSIS — B0233 Zoster keratitis: Secondary | ICD-10-CM | POA: Diagnosis present

## 2017-05-16 DIAGNOSIS — Z880 Allergy status to penicillin: Secondary | ICD-10-CM

## 2017-05-16 DIAGNOSIS — C50212 Malignant neoplasm of upper-inner quadrant of left female breast: Secondary | ICD-10-CM | POA: Diagnosis present

## 2017-05-16 DIAGNOSIS — I10 Essential (primary) hypertension: Secondary | ICD-10-CM | POA: Diagnosis present

## 2017-05-16 DIAGNOSIS — L03112 Cellulitis of left axilla: Secondary | ICD-10-CM | POA: Diagnosis present

## 2017-05-16 DIAGNOSIS — K219 Gastro-esophageal reflux disease without esophagitis: Secondary | ICD-10-CM | POA: Diagnosis not present

## 2017-05-16 DIAGNOSIS — L7682 Other postprocedural complications of skin and subcutaneous tissue: Secondary | ICD-10-CM | POA: Diagnosis not present

## 2017-05-16 DIAGNOSIS — C50912 Malignant neoplasm of unspecified site of left female breast: Secondary | ICD-10-CM | POA: Diagnosis not present

## 2017-05-16 DIAGNOSIS — L089 Local infection of the skin and subcutaneous tissue, unspecified: Secondary | ICD-10-CM

## 2017-05-16 DIAGNOSIS — R402412 Glasgow coma scale score 13-15, at arrival to emergency department: Secondary | ICD-10-CM | POA: Diagnosis not present

## 2017-05-16 DIAGNOSIS — M79622 Pain in left upper arm: Secondary | ICD-10-CM | POA: Diagnosis not present

## 2017-05-16 DIAGNOSIS — Z885 Allergy status to narcotic agent status: Secondary | ICD-10-CM | POA: Diagnosis not present

## 2017-05-16 DIAGNOSIS — Z79899 Other long term (current) drug therapy: Secondary | ICD-10-CM

## 2017-05-16 DIAGNOSIS — J45909 Unspecified asthma, uncomplicated: Secondary | ICD-10-CM | POA: Diagnosis present

## 2017-05-16 DIAGNOSIS — E039 Hypothyroidism, unspecified: Secondary | ICD-10-CM | POA: Diagnosis not present

## 2017-05-16 DIAGNOSIS — L0390 Cellulitis, unspecified: Secondary | ICD-10-CM

## 2017-05-16 DIAGNOSIS — E785 Hyperlipidemia, unspecified: Secondary | ICD-10-CM | POA: Diagnosis present

## 2017-05-16 DIAGNOSIS — B958 Unspecified staphylococcus as the cause of diseases classified elsewhere: Secondary | ICD-10-CM | POA: Diagnosis not present

## 2017-05-16 DIAGNOSIS — T783XXA Angioneurotic edema, initial encounter: Secondary | ICD-10-CM | POA: Diagnosis not present

## 2017-05-16 DIAGNOSIS — L7634 Postprocedural seroma of skin and subcutaneous tissue following other procedure: Secondary | ICD-10-CM | POA: Diagnosis not present

## 2017-05-16 DIAGNOSIS — S40022A Contusion of left upper arm, initial encounter: Secondary | ICD-10-CM | POA: Diagnosis not present

## 2017-05-16 DIAGNOSIS — L02412 Cutaneous abscess of left axilla: Secondary | ICD-10-CM | POA: Diagnosis not present

## 2017-05-16 LAB — CBC WITH DIFFERENTIAL/PLATELET
Basophils Absolute: 0 10*3/uL (ref 0.0–0.1)
Basophils Relative: 0 %
Eosinophils Absolute: 0 10*3/uL (ref 0.0–0.7)
Eosinophils Relative: 0 %
HCT: 33 % — ABNORMAL LOW (ref 36.0–46.0)
Hemoglobin: 11 g/dL — ABNORMAL LOW (ref 12.0–15.0)
Lymphocytes Relative: 32 %
Lymphs Abs: 2 10*3/uL (ref 0.7–4.0)
MCH: 32.4 pg (ref 26.0–34.0)
MCHC: 33.3 g/dL (ref 30.0–36.0)
MCV: 97.3 fL (ref 78.0–100.0)
Monocytes Absolute: 0.5 10*3/uL (ref 0.1–1.0)
Monocytes Relative: 8 %
Neutro Abs: 3.8 10*3/uL (ref 1.7–7.7)
Neutrophils Relative %: 60 %
Platelets: 388 10*3/uL (ref 150–400)
RBC: 3.39 MIL/uL — ABNORMAL LOW (ref 3.87–5.11)
RDW: 14.9 % (ref 11.5–15.5)
WBC: 6.3 10*3/uL (ref 4.0–10.5)

## 2017-05-16 LAB — COMPREHENSIVE METABOLIC PANEL
ALT: 24 U/L (ref 14–54)
AST: 27 U/L (ref 15–41)
Albumin: 3.3 g/dL — ABNORMAL LOW (ref 3.5–5.0)
Alkaline Phosphatase: 74 U/L (ref 38–126)
Anion gap: 11 (ref 5–15)
BUN: 18 mg/dL (ref 6–20)
CO2: 29 mmol/L (ref 22–32)
Calcium: 9.6 mg/dL (ref 8.9–10.3)
Chloride: 98 mmol/L — ABNORMAL LOW (ref 101–111)
Creatinine, Ser: 0.75 mg/dL (ref 0.44–1.00)
GFR calc Af Amer: >60 mL/min (ref >60)
GFR calc non Af Amer: >60 mL/min (ref >60)
Glucose, Bld: 134 mg/dL — ABNORMAL HIGH (ref 65–99)
Potassium: 3.5 mmol/L (ref 3.5–5.1)
Sodium: 138 mmol/L (ref 135–145)
Total Bilirubin: 0.3 mg/dL (ref 0.3–1.2)
Total Protein: 7 g/dL (ref 6.5–8.1)

## 2017-05-16 LAB — I-STAT CG4 LACTIC ACID, ED: Lactic Acid, Venous: 1.98 mmol/L — ABNORMAL HIGH (ref 0.5–1.9)

## 2017-05-16 MED ORDER — VANCOMYCIN HCL IN DEXTROSE 1-5 GM/200ML-% IV SOLN
1000.0000 mg | INTRAVENOUS | Status: AC
  Administered 2017-05-17: 1000 mg via INTRAVENOUS
  Filled 2017-05-16: qty 200

## 2017-05-16 MED ORDER — SODIUM CHLORIDE 0.9 % IV BOLUS (SEPSIS)
1000.0000 mL | Freq: Once | INTRAVENOUS | Status: AC
  Administered 2017-05-16: 1000 mL via INTRAVENOUS

## 2017-05-16 NOTE — ED Triage Notes (Signed)
Pt had lymph nodes removed in May and experienced an abscess. She was seen here on 6/26 when that abscess ruptured. Today, she states that the area has been oozing green, malodorous discharge. . Last chemo on 6/25. Pt also endorses nausea today. Daughter states that pt feel clammy. Pt also states that she has shingles in her L eye, but is on medication for that.

## 2017-05-16 NOTE — ED Provider Notes (Signed)
[facility] DEPT Provider Note   CSN: [id number] Arrival date & time: 05/16/17  2127     History   Chief Complaint Chief Complaint  Patient presents with  . Abscess    cancer patient    HPI Jessica [name] is a 76 y.o. female.  Patient presents to the ER for evaluation of increased swelling and drainage from axillary wound. Patient has had previous axillary lymph node dissection. She was seen in the ER on June 26 with spontaneous dehiscence of the surgical site with infection. At that time surgery felt she was well enough to be discharged on antibiotics. She reports that she did well on antibiotics. She has finished a course of antibiotics, now experiencing increased drainage. Today the drainage turned very malodorous. She thinks she had a fever yesterday. She has been feeling increased weakness, nausea without vomiting.      Past Medical History:  Diagnosis Date  . Allergy   . Anemia   . Anxiety   . Asthma    in past, no inhalers now  . Blood transfusion without reported diagnosis   . Breast cancer ([place]) 01/2017   left breast   . Breast cancer of upper-inner quadrant of left female breast ([place]) 03/11/2017  . Cancer ([place]) 01/2017   left breast  . Cataract    bilateral removed  . Chronic kidney disease    kidney stones  . Depression 08/31/2013  . Dyslipidemia, goal LDL below 160 08/31/2013  . Essential hypertension - well controlled 08/31/2013  . GERD (gastroesophageal reflux disease)   . Goals of care, counseling/discussion 03/11/2017  . Hypothyroidism   . Obesity (BMI 30-39.9) 08/31/2013  . Thyroid disease   . Vasovagal near-syncope -rare 08/31/2013    Patient Active Problem List   Diagnosis Date Noted  . Breast cancer of upper-inner quadrant of left female breast ([place]) 03/11/2017  . Goals of care, counseling/discussion 03/11/2017  . Obesity (BMI 30-39.9) 08/31/2013  . Essential hypertension - well controlled 08/31/2013    Class: Diagnosis of  .  Depression 08/31/2013    Class: Diagnosis of  . Vasovagal near-syncope -rare 08/31/2013    Class: History of  . Dyslipidemia, goal LDL below 160 08/31/2013    Past Surgical History:  Procedure Laterality Date  . ABDOMINAL HYSTERECTOMY     total  . BREAST BIOPSY Left 01/2017    malignant  . broken fingers    . CATARACT EXTRACTION Bilateral   . COLONOSCOPY    . EXCISIONAL HEMORRHOIDECTOMY    . kidney stones lithotripsy    . PORTACATH PLACEMENT Right 03/23/2017   Procedure: INSERTION PORT-A-CATH;  Surgeon: Erroll Luna, MD;  Location: [facility];  Service: General;  Laterality: Right;  . RADIOACTIVE SEED GUIDED MASTECTOMY WITH AXILLARY SENTINEL LYMPH NODE BIOPSY Left 03/23/2017   Procedure: LEFT BREAST RADIOACTIVE SEED GUIDED PARTIAL MASTECTOMY AND  SENTINEL LYMPH NODE MAPPING;  Surgeon: Erroll Luna, MD;  Location: [facility];  Service: General;  Laterality: Left;  . ROTATOR CUFF REPAIR     left   . TONSILLECTOMY    . WISDOM TOOTH EXTRACTION      OB History    No data available       Home Medications    Prior to Admission medications   Medication Sig Start Date End Date Taking? Authorizing Provider  benazepril (LOTENSIN) 20 MG tablet Take 20 mg by mouth daily. 07/19/13   [provider]  calcium citrate-vitamin D (CITRACAL+D) 315-200 MG-UNIT per tablet Take  1 tablet by mouth daily.    [provider]  celecoxib (CELEBREX) 100 MG capsule Take 1 capsule (100 mg total) by mouth 2 (two) times daily. 03/23/17   Cornett, Marcello Moores, MD  dexamethasone (DECADRON) 4 MG tablet Take 2 pills with food every 12 hours for 4 days.  Start day before each chemotherapy session. 04/12/17   [name], MD  diphenhydramine-acetaminophen (TYLENOL PM) 25-500 MG TABS Take 1 tablet by mouth at bedtime as needed (sleep).     [provider]  hydrochlorothiazide (MICROZIDE) 12.5 MG capsule Take 12.5 mg by mouth daily. 02/28/16   [provider]  levothyroxine (SYNTHROID, LEVOTHROID) 75 MCG tablet Take 75 mcg by mouth daily. 07/19/13   [provider]  lidocaine-prilocaine (EMLA) cream Apply 1 application topically as needed. 04/12/17   [name], MD  LORazepam (ATIVAN) 0.5 MG tablet Take 1 tablet (0.5 mg total) by mouth every 6 (six) hours as needed (Nausea or vomiting). 04/14/17   [name], MD  Lysine 1000 MG TABS Take 1,000 mg by mouth as needed.    [provider]  Multiple Vitamin (MULTIVITAMIN) tablet Take 1 tablet by mouth daily.    [provider]  omeprazole (PRILOSEC OTC) 20 MG tablet Take 20 mg by mouth daily.    [provider]  ondansetron (ZOFRAN) 8 MG tablet Take 1 tablet (8 mg total) by mouth 2 (two) times daily as needed for refractory nausea / vomiting. Start on day 3 after chemo. 04/14/17   [name], MD  oxyCODONE (OXY IR/ROXICODONE) 5 MG immediate release tablet Take 1-2 tablets (5-10 mg total) by mouth every 6 (six) hours as needed for severe pain. 03/23/17   Cornett, Marcello Moores, MD  prednisoLONE acetate (PRED FORTE) 1 % ophthalmic suspension Place 2 drops into both eyes 3 (three) times daily. For 7 days. 05/04/17   [name], MD  prochlorperazine (COMPAZINE) 10 MG tablet TAKE 1 TABLET(10 MG) BY MOUTH EVERY 6 HOURS AS NEEDED FOR NAUSEA OR VOMITING 04/14/17   [name], MD  venlafaxine XR (EFFEXOR-XR) 75 MG 24 hr capsule Take 225 mg by mouth daily.    [provider]    Family History Family History  Problem Relation Age of Onset  . Cervical cancer Maternal Aunt   . Colon cancer Neg Hx   . Pancreatic cancer Neg Hx   . Stomach cancer Neg Hx   . Esophageal cancer Neg Hx   . Rectal cancer Neg Hx     Social History Social History  Substance Use Topics  . Smoking status: Never Smoker  . Smokeless tobacco: Never Used  . Alcohol use No     Allergies   Codeine; Penicillins; Advil [ibuprofen]; and Band-aid plus antibiotic  [bacitracin-polymyxin b]   Review of Systems Review of Systems  Constitutional: Positive for fever.  Gastrointestinal: Positive for nausea.  Skin: Positive for wound.  All other systems reviewed and are negative.    Physical Exam Updated Vital Signs BP 139/81 (BP Location: Right Arm)   Pulse 96   Temp 98.6 F (37 C) (Oral)   Resp 18   SpO2 98%   Physical Exam  Constitutional: She is oriented to person, place, and time. She appears well-developed and well-nourished. No distress.  HENT:  Head: Normocephalic and atraumatic.  Right Ear: Hearing normal.  Left Ear: Hearing normal.  Nose: Nose normal.  Mouth/Throat: Oropharynx is clear and moist and mucous membranes are normal.  Eyes: Pupils  are equal, round, and reactive to light. Conjunctivae and EOM are normal.  Neck: Normal range of motion. Neck supple.  Cardiovascular: Regular rhythm, S1 normal and S2 normal.  Exam reveals no gallop and no friction rub.   No murmur heard. Pulmonary/Chest: Effort normal and breath sounds normal. No respiratory distress. She exhibits no tenderness.  Abdominal: Soft. Normal appearance and bowel sounds are normal. There is no hepatosplenomegaly. There is no tenderness. There is no rebound, no guarding, no tenderness at McBurney's point and negative Murphy's sign. No hernia.  Musculoskeletal: Normal range of motion.  Neurological: She is alert and oriented to person, place, and time. She has normal strength. No cranial nerve deficit or sensory deficit. Coordination normal. GCS eye subscore is 4. GCS verbal subscore is 5. GCS motor subscore is 6.  Skin: Skin is warm, dry and intact. No rash noted. No cyanosis.  Left axillary wound 1.5cm with packing in place. Green drainage adherent to packing. No significant erythema or induration.  Psychiatric: She has a normal mood and affect. Her speech is normal and behavior is normal. Thought content normal.  Nursing note and vitals reviewed.    ED  Treatments / Results  Labs (all labs ordered are listed, but only abnormal results are displayed) Labs Reviewed  COMPREHENSIVE METABOLIC PANEL - Abnormal; Notable for the following:       Result Value   Chloride 98 (*)    Glucose, Bld 134 (*)    Albumin 3.3 (*)    All other components within normal limits  CBC WITH DIFFERENTIAL/PLATELET - Abnormal; Notable for the following:    RBC 3.39 (*)    Hemoglobin 11.0 (*)    HCT 33.0 (*)    All other components within normal limits  I-STAT CG4 LACTIC ACID, ED - Abnormal; Notable for the following:    Lactic Acid, Venous 1.98 (*)    All other components within normal limits  AEROBIC CULTURE (SUPERFICIAL SPECIMEN)  CULTURE, BLOOD (ROUTINE X 2)  CULTURE, BLOOD (ROUTINE X 2)  URINALYSIS, ROUTINE W REFLEX MICROSCOPIC    EKG  EKG Interpretation None       Radiology No results found.  Procedures Procedures (including critical care time)  Medications Ordered in ED Medications  sodium chloride 0.9 % bolus 1,000 mL (not administered)     Initial Impression / Assessment and Plan / ED Course  I have reviewed the triage vital signs and the nursing notes.  Pertinent labs & imaging results that were available during my care of the patient were reviewed by me and considered in my medical decision making (see chart for details).     Patient presents with increasing signs of infection in previously noted surgical area. Patient had axillary node dissection with subsequent development of surgical wound infection. Culture at that time grew out sensitive staph aureus. Patient now experiencing increased drainage that is significantly malodorous, a change over the last 24 hours. She is off antibiotics. White blood cell count is 6, but she did receive chemotherapy previously. She is due for her next chemotherapy dose in 3 days. Her lactic acid is 1.98, significantly higher than it was at her previous visit. She reports fevers at home. I feel that  she is at significant risk for worsening infection, although does not appear to have any severe sepsis at this time. She'll be given a liter of normal saline solution and I will ask hospitalist to admit the patient. Will initiate IV antibiotic therapy.  Final Clinical Impressions(s) /  ED Diagnoses   Final diagnoses:  Wound infection    New Prescriptions New Prescriptions   No medications on file     Orpah Greek, MD 05/16/17 2333

## 2017-05-16 NOTE — H&P (Signed)
History and Physical    VEGAS COFFIN OXB:[id number] DOB: [birth year] DOA: 05/16/2017  PCP: [name], MD   Patient coming from: Home.  I have personally briefly reviewed patient's old medical records in [facility] [place]  Chief Complaint: Abscess of left axillary area.  HPI: Jessica [name] is a 76 y.o. female with medical history significant of allergy, anemia, anxiety, depression, asthma, urolithiasis, chronic kidney disease, hyperlipidemia, essential hypertension, GERD, hypothyroidism, breast cancer with status post left lumpectomy with lymph node resection in 03/23/2017 by Dr. Erroll Luna, who was seen on 04/27/2017, following chemotherapy treatment today before (04/26/2017). by ED providers Drs. Pollina and Tegeler and Dr. Zella Richer from general surgery due to abscess and dehiscence of left chest/axillary area, was discharged home that day on oral doxycycline. Today she is coming to the emergency department due to developing greenish, malodorous discharge since Saturday evening. She also had subjective fever, chills and night sweats on Saturday evening/Sunday morning. Today, the patient states that she has been feeling very fatigued, sleepy, nauseous with decreased appetite and mildly constipated. She also mentions that she is also currently taking Valtrex for HZV. She denies chest pain, dizziness, PND, orthopnea or pitting edema of the lower extremities. She denies emesis, melena or hematochezia. Denies dysuria, hematuria, frequency or oliguria.  ED Course: Initial vital signs temperature 98.58F, pulse 96, respirations 18, blood pressure 139/81 mmHg and O2 sat 98% on room air. Her culture from the area, done last month, grew pansensitive staph aureus. She was started on vancomycin in the emergency department. She had a new wound culture and sensitivity done earlier today as well as blood cultures 2. WBC 6.3, hemoglobin 11.0 g/dL and platelets 388. Her CMP shows sodium mildly  decreased chloride at 98 mmol/L, glucose of 134 mg/dL and albumin of 3.3 g/dL. Lactic acid was mildly elevated at 1.98 mmol/L.  Review of Systems: As per HPI otherwise 10 point review of systems negative.    Past Medical History:  Diagnosis Date  . Allergy   . Anemia   . Anxiety   . Asthma    in past, no inhalers now  . Blood transfusion without reported diagnosis   . Breast cancer ([place]) 01/2017   left breast   . Breast cancer of upper-inner quadrant of left female breast ([place]) 03/11/2017  . Cancer ([place]) 01/2017   left breast  . Cataract    bilateral removed  . Chronic kidney disease    kidney stones  . Depression 08/31/2013  . Dyslipidemia, goal LDL below 160 08/31/2013  . Essential hypertension - well controlled 08/31/2013  . GERD (gastroesophageal reflux disease)   . Goals of care, counseling/discussion 03/11/2017  . Hypothyroidism   . Obesity (BMI 30-39.9) 08/31/2013  . Thyroid disease   . Vasovagal near-syncope -rare 08/31/2013    Past Surgical History:  Procedure Laterality Date  . ABDOMINAL HYSTERECTOMY     total  . BREAST BIOPSY Left 01/2017    malignant  . broken fingers    . CATARACT EXTRACTION Bilateral   . COLONOSCOPY    . EXCISIONAL HEMORRHOIDECTOMY    . kidney stones lithotripsy    . PORTACATH PLACEMENT Right 03/23/2017   Procedure: INSERTION PORT-A-CATH;  Surgeon: Erroll Luna, MD;  Location: [facility];  Service: General;  Laterality: Right;  . RADIOACTIVE SEED GUIDED MASTECTOMY WITH AXILLARY SENTINEL LYMPH NODE BIOPSY Left 03/23/2017   Procedure: LEFT BREAST RADIOACTIVE SEED GUIDED PARTIAL MASTECTOMY AND  SENTINEL LYMPH NODE MAPPING;  Surgeon: Erroll Luna, MD;  Location: [facility];  Service: General;  Laterality: Left;  . ROTATOR CUFF REPAIR     left   . TONSILLECTOMY    . WISDOM TOOTH EXTRACTION       reports that she has never smoked. She has never used smokeless tobacco. She reports that she does not drink  alcohol or use drugs.  Allergies  Allergen Reactions  . Codeine Hives  . Penicillins Hives  . Advil [Ibuprofen] Hives and Itching  . Band-Aid Plus Antibiotic [Bacitracin-Polymyxin B] Dermatitis    Family History  Problem Relation Age of Onset  . Cervical cancer Maternal Aunt   . Colon cancer Neg Hx   . Pancreatic cancer Neg Hx   . Stomach cancer Neg Hx   . Esophageal cancer Neg Hx   . Rectal cancer Neg Hx     Prior to Admission medications   Medication Sig Start Date End Date Taking? Authorizing Provider  benazepril (LOTENSIN) 20 MG tablet Take 20 mg by mouth daily. 07/19/13   [provider]  calcium citrate-vitamin D (CITRACAL+D) 315-200 MG-UNIT per tablet Take 1 tablet by mouth daily.    [provider]  celecoxib (CELEBREX) 100 MG capsule Take 1 capsule (100 mg total) by mouth 2 (two) times daily. 03/23/17   Cornett, Marcello Moores, MD  dexamethasone (DECADRON) 4 MG tablet Take 2 pills with food every 12 hours for 4 days.  Start day before each chemotherapy session. 04/12/17   [name], MD  diphenhydramine-acetaminophen (TYLENOL PM) 25-500 MG TABS Take 1 tablet by mouth at bedtime as needed (sleep).     [provider]  hydrochlorothiazide (MICROZIDE) 12.5 MG capsule Take 12.5 mg by mouth daily. 02/28/16   [provider]  levothyroxine (SYNTHROID, LEVOTHROID) 75 MCG tablet Take 75 mcg by mouth daily. 07/19/13   [provider]  lidocaine-prilocaine (EMLA) cream Apply 1 application topically as needed. 04/12/17   [name], MD  LORazepam (ATIVAN) 0.5 MG tablet Take 1 tablet (0.5 mg total) by mouth every 6 (six) hours as needed (Nausea or vomiting). 04/14/17   [name], MD  Lysine 1000 MG TABS Take 1,000 mg by mouth as needed.    [provider]  Multiple Vitamin (MULTIVITAMIN) tablet Take 1 tablet by mouth daily.    [provider]  omeprazole (PRILOSEC OTC) 20 MG tablet Take 20 mg by mouth daily.    [provider]  ondansetron (ZOFRAN) 8 MG tablet Take 1 tablet (8 mg total) by mouth 2 (two) times daily as needed for refractory nausea / vomiting. Start on day 3 after chemo. 04/14/17   [name], MD  oxyCODONE (OXY IR/ROXICODONE) 5 MG immediate release tablet Take 1-2 tablets (5-10 mg total) by mouth every 6 (six) hours as needed for severe pain. 03/23/17   Cornett, Marcello Moores, MD  prednisoLONE acetate (PRED FORTE) 1 % ophthalmic suspension Place 2 drops into both eyes 3 (three) times daily. For 7 days. 05/04/17   [name], MD  prochlorperazine (COMPAZINE) 10 MG tablet TAKE 1 TABLET(10 MG) BY MOUTH EVERY 6 HOURS AS NEEDED FOR NAUSEA OR VOMITING 04/14/17   [name], MD  venlafaxine XR (EFFEXOR-XR) 75 MG 24 hr capsule Take 225 mg by mouth daily.    [provider]    Physical Exam: Vitals:   05/16/17 2145  BP: 139/81  Pulse: 96  Resp: 18  Temp: 98.6 F (37 C)  TempSrc: Oral  SpO2: 98%  Constitutional: NAD, calm, comfortable Eyes: PERRL, lids and conjunctivae normal ENMT: Mucous membranes are mildly dry. Posterior pharynx clear of any exudate or lesions. Neck: normal, supple, no masses, no thyromegaly Respiratory: clear to auscultation bilaterally, no wheezing, no crackles. Normal respiratory effort. No accessory muscle use.  Cardiovascular: Regular rate and rhythm, no murmurs / rubs / gallops. No extremity edema. 2+ pedal pulses. No carotid bruits.  Abdomen: Soft, no tenderness, no masses palpated. No hepatosplenomegaly. Bowel sounds positive.  Musculoskeletal: no clubbing / cyanosis.  Good ROM, no contractures. Normal muscle tone.  Skin: Positive left axillary area wound with packing, surrounding erythema and edema. This is tender to palpation. Neurologic: CN 2-12 grossly intact. Sensation intact, DTR normal. Strength 5/5 in all 4.  Psychiatric: Normal judgment and insight. Alert and oriented x 4. Normal mood.       Labs on Admission: I have personally  reviewed following labs and imaging studies  CBC:  Recent Labs Lab 05/16/17 2159  WBC 6.3  NEUTROABS 3.8  HGB 11.0*  HCT 33.0*  MCV 97.3  PLT 696   Basic Metabolic Panel:  Recent Labs Lab 05/16/17 2159  NA 138  K 3.5  CL 98*  CO2 29  GLUCOSE 134*  BUN 18  CREATININE 0.75  CALCIUM 9.6   GFR: CrCl cannot be calculated (Unknown ideal weight.). Liver Function Tests:  Recent Labs Lab 05/16/17 2159  AST 27  ALT 24  ALKPHOS 74  BILITOT 0.3  PROT 7.0  ALBUMIN 3.3*   No results for input(s): LIPASE, AMYLASE in the last 168 hours. No results for input(s): AMMONIA in the last 168 hours. Coagulation Profile: No results for input(s): INR, PROTIME in the last 168 hours. Cardiac Enzymes: No results for input(s): CKTOTAL, CKMB, CKMBINDEX, TROPONINI in the last 168 hours. BNP (last 3 results) No results for input(s): PROBNP in the last 8760 hours. HbA1C: No results for input(s): HGBA1C in the last 72 hours. CBG: No results for input(s): GLUCAP in the last 168 hours. Lipid Profile: No results for input(s): CHOL, HDL, LDLCALC, TRIG, CHOLHDL, LDLDIRECT in the last 72 hours. Thyroid Function Tests: No results for input(s): TSH, T4TOTAL, FREET4, T3FREE, THYROIDAB in the last 72 hours. Anemia Panel: No results for input(s): VITAMINB12, FOLATE, FERRITIN, TIBC, IRON, RETICCTPCT in the last 72 hours. Urine analysis: No results found for: COLORURINE, APPEARANCEUR, LABSPEC, PHURINE, GLUCOSEU, HGBUR, BILIRUBINUR, KETONESUR, PROTEINUR, UROBILINOGEN, NITRITE, LEUKOCYTESUR  Radiological Exams on Admission: No results found.  EKG: Independently reviewed.   Assessment/Plan Principal Problem:   Cellulitis and abscess of left axillary area. Admit to MedSurg/inpatient. Continue analgesics as needed. Consult wound care team. Continue vancomycin per pharmacy. Consider evaluation by surgery if no improvement.  Active Problems:   Breast cancer of upper-inner quadrant of left  female breast [facility]) Continue treatment and follow-ups per oncology.    Essential hypertension - well controlled Continue Lovenox approval 20 mg by mouth daily. Continue hydrochlorothiazide 12.5 mg by mouth daily. Monitor blood pressure, renal function and electrolytes.      GERD (gastroesophageal reflux disease) Pantoprazole 40 mg by mouth daily.    Hypothyroidism Continue levothyroxine 75 g by mouth daily. Monitor TSH as needed.    Depression with anxiety Continue Effexor X are 225 mg by mouth daily. Continue lorazepam 0.5 mg by mouth every 6 hours as needed.    DVT prophylaxis: Heparin SQ. Code Status: Full code. Family Communication: Her daughter was present in the ED/MedSurg room. Disposition Plan: Admit for IV antibiotics for 2-3 days. Consults  called:  Admission status: Inpatient/MedSurg.   Reubin Milan MD [facility] Pager [phone]  If 7PM-7AM, please contact night-coverage [email or web address] Password [facility]  05/16/2017, 11:43 PM

## 2017-05-16 NOTE — Progress Notes (Signed)
Pharmacy Antibiotic Note  Jessica [name] is a 76 y.o. female with increased swelling and drainage from axillary wound admitted on 05/16/2017 with wound infection.  Pharmacy has been consulted for vancomycin dosing.  Plan: Vancomycin 1 Gm x1 then 750 mg IV q12h VT=15-20 mg/L F/u scr/cultures/levels  Height: 5\' 2"  (157.5 cm) Weight: 168 lb (76.2 kg) IBW/kg (Calculated) : 50.1  Temp (24hrs), Avg:98.6 F (37 C), Min:98.6 F (37 C), Max:98.6 F (37 C)   Recent Labs Lab 05/16/17 2159 05/16/17 2210  WBC 6.3  --   CREATININE 0.75  --   LATICACIDVEN  --  1.98*    Estimated Creatinine Clearance: 58 mL/min (by C-G formula based on SCr of 0.75 mg/dL).    Allergies  Allergen Reactions  . Codeine Hives  . Penicillins Hives  . Advil [Ibuprofen] Hives and Itching  . Band-Aid Plus Antibiotic [Bacitracin-Polymyxin B] Dermatitis    Antimicrobials this admission: 7/16 vancomycin >>    >>   Dose adjustments this admission:   Microbiology results:  BCx:   UCx:    Sputum:    MRSA PCR:   Thank you for allowing pharmacy to be a part of this patient's care.  Dorrene German 05/16/2017 11:56 PM

## 2017-05-17 ENCOUNTER — Encounter (HOSPITAL_COMMUNITY): Payer: Self-pay | Admitting: Internal Medicine

## 2017-05-17 DIAGNOSIS — L03112 Cellulitis of left axilla: Secondary | ICD-10-CM | POA: Diagnosis present

## 2017-05-17 DIAGNOSIS — T148XXA Other injury of unspecified body region, initial encounter: Secondary | ICD-10-CM

## 2017-05-17 DIAGNOSIS — L089 Local infection of the skin and subcutaneous tissue, unspecified: Secondary | ICD-10-CM

## 2017-05-17 LAB — LACTIC ACID, PLASMA
Lactic Acid, Venous: 1.1 mmol/L (ref 0.5–1.9)
Lactic Acid, Venous: 1.2 mmol/L (ref 0.5–1.9)

## 2017-05-17 LAB — CBC WITH DIFFERENTIAL/PLATELET
Basophils Absolute: 0 10*3/uL (ref 0.0–0.1)
Basophils Relative: 0 %
Eosinophils Absolute: 0 10*3/uL (ref 0.0–0.7)
Eosinophils Relative: 0 %
HCT: 29.6 % — ABNORMAL LOW (ref 36.0–46.0)
Hemoglobin: 9.8 g/dL — ABNORMAL LOW (ref 12.0–15.0)
Lymphocytes Relative: 39 %
Lymphs Abs: 1.6 10*3/uL (ref 0.7–4.0)
MCH: 32.7 pg (ref 26.0–34.0)
MCHC: 33.1 g/dL (ref 30.0–36.0)
MCV: 98.7 fL (ref 78.0–100.0)
Monocytes Absolute: 0.5 10*3/uL (ref 0.1–1.0)
Monocytes Relative: 11 %
Neutro Abs: 2 10*3/uL (ref 1.7–7.7)
Neutrophils Relative %: 50 %
Platelets: 315 10*3/uL (ref 150–400)
RBC: 3 MIL/uL — ABNORMAL LOW (ref 3.87–5.11)
RDW: 15.1 % (ref 11.5–15.5)
WBC: 4 10*3/uL (ref 4.0–10.5)

## 2017-05-17 LAB — URINALYSIS, ROUTINE W REFLEX MICROSCOPIC
Bilirubin Urine: NEGATIVE
Glucose, UA: NEGATIVE mg/dL
Hgb urine dipstick: NEGATIVE
Ketones, ur: NEGATIVE mg/dL
Nitrite: NEGATIVE
Protein, ur: NEGATIVE mg/dL
Specific Gravity, Urine: 1.017 (ref 1.005–1.030)
pH: 5 (ref 5.0–8.0)

## 2017-05-17 LAB — BASIC METABOLIC PANEL
Anion gap: 3 — ABNORMAL LOW (ref 5–15)
BUN: 12 mg/dL (ref 6–20)
CO2: 29 mmol/L (ref 22–32)
Calcium: 8.8 mg/dL — ABNORMAL LOW (ref 8.9–10.3)
Chloride: 107 mmol/L (ref 101–111)
Creatinine, Ser: 0.6 mg/dL (ref 0.44–1.00)
GFR calc Af Amer: >60 mL/min (ref >60)
GFR calc non Af Amer: >60 mL/min (ref >60)
Glucose, Bld: 122 mg/dL — ABNORMAL HIGH (ref 65–99)
Potassium: 3.8 mmol/L (ref 3.5–5.1)
Sodium: 139 mmol/L (ref 135–145)

## 2017-05-17 MED ORDER — LORAZEPAM 0.5 MG PO TABS: 0.5000 mg | ORAL_TABLET | Freq: Four times a day (QID) | ORAL | Status: DC | PRN

## 2017-05-17 MED ORDER — HEPARIN SODIUM (PORCINE) 5000 UNIT/ML IJ SOLN
5000.0000 [IU] | Freq: Three times a day (TID) | INTRAMUSCULAR | Status: DC
  Filled 2017-05-17: qty 1

## 2017-05-17 MED ORDER — VANCOMYCIN HCL IN DEXTROSE 750-5 MG/150ML-% IV SOLN
750.0000 mg | Freq: Two times a day (BID) | INTRAVENOUS | Status: DC
  Administered 2017-05-17 – 2017-05-18 (×4): 750 mg via INTRAVENOUS
  Filled 2017-05-17 (×4): qty 150

## 2017-05-17 MED ORDER — ONDANSETRON HCL 4 MG PO TABS: 8.0000 mg | ORAL_TABLET | Freq: Two times a day (BID) | ORAL | Status: DC | PRN

## 2017-05-17 MED ORDER — POTASSIUM CHLORIDE CRYS ER 20 MEQ PO TBCR
40.0000 meq | EXTENDED_RELEASE_TABLET | Freq: Once | ORAL | Status: AC
  Administered 2017-05-17: 40 meq via ORAL
  Filled 2017-05-17: qty 2

## 2017-05-17 MED ORDER — ENOXAPARIN SODIUM 40 MG/0.4ML ~~LOC~~ SOLN
40.0000 mg | SUBCUTANEOUS | Status: DC
  Administered 2017-05-17 – 2017-05-21 (×5): 40 mg via SUBCUTANEOUS
  Filled 2017-05-17 (×5): qty 0.4

## 2017-05-17 MED ORDER — DIPHENHYDRAMINE HCL 50 MG PO CAPS
50.0000 mg | ORAL_CAPSULE | Freq: Once | ORAL | Status: AC
  Administered 2017-05-17: 50 mg via ORAL
  Filled 2017-05-17: qty 1

## 2017-05-17 MED ORDER — BENAZEPRIL HCL 10 MG PO TABS
20.0000 mg | ORAL_TABLET | Freq: Every day | ORAL | Status: DC
  Administered 2017-05-17 – 2017-05-18 (×2): 20 mg via ORAL
  Filled 2017-05-17 (×2): qty 2

## 2017-05-17 MED ORDER — CALCIUM CARBONATE-VITAMIN D 500-200 MG-UNIT PO TABS
1.0000 | ORAL_TABLET | Freq: Every day | ORAL | Status: DC
  Administered 2017-05-17 – 2017-05-21 (×5): 1 via ORAL
  Filled 2017-05-17 (×5): qty 1

## 2017-05-17 MED ORDER — VALACYCLOVIR HCL 500 MG PO TABS
1000.0000 mg | ORAL_TABLET | Freq: Three times a day (TID) | ORAL | Status: DC
  Administered 2017-05-17 – 2017-05-21 (×12): 1000 mg via ORAL
  Filled 2017-05-17 (×14): qty 2

## 2017-05-17 MED ORDER — PANTOPRAZOLE SODIUM 40 MG PO TBEC
40.0000 mg | DELAYED_RELEASE_TABLET | Freq: Every day | ORAL | Status: DC
  Administered 2017-05-17 – 2017-05-21 (×5): 40 mg via ORAL
  Filled 2017-05-17 (×5): qty 1

## 2017-05-17 MED ORDER — ACETAMINOPHEN 325 MG PO TABS
650.0000 mg | ORAL_TABLET | Freq: Four times a day (QID) | ORAL | Status: DC | PRN
  Administered 2017-05-17 (×2): 650 mg via ORAL
  Filled 2017-05-17 (×3): qty 2

## 2017-05-17 MED ORDER — PROCHLORPERAZINE MALEATE 10 MG PO TABS: 10.0000 mg | ORAL_TABLET | Freq: Four times a day (QID) | ORAL | Status: DC | PRN

## 2017-05-17 MED ORDER — DIPHENHYDRAMINE HCL 25 MG PO CAPS
25.0000 mg | ORAL_CAPSULE | ORAL | Status: DC | PRN
Start: 2017-05-17 — End: 2017-05-21

## 2017-05-17 MED ORDER — HYDROCHLOROTHIAZIDE 12.5 MG PO CAPS
12.5000 mg | ORAL_CAPSULE | Freq: Every day | ORAL | Status: DC
  Administered 2017-05-17 – 2017-05-21 (×5): 12.5 mg via ORAL
  Filled 2017-05-17 (×5): qty 1

## 2017-05-17 MED ORDER — VENLAFAXINE HCL ER 75 MG PO CP24
225.0000 mg | ORAL_CAPSULE | Freq: Every day | ORAL | Status: DC
  Administered 2017-05-17 – 2017-05-21 (×5): 225 mg via ORAL
  Filled 2017-05-17 (×5): qty 1

## 2017-05-17 MED ORDER — DIPHENHYDRAMINE HCL 50 MG/ML IJ SOLN: 25.0000 mg | Freq: Once | INTRAMUSCULAR | Status: DC

## 2017-05-17 MED ORDER — OXYCODONE HCL 5 MG PO TABS
5.0000 mg | ORAL_TABLET | Freq: Four times a day (QID) | ORAL | Status: DC | PRN
  Administered 2017-05-17: 5 mg via ORAL
  Administered 2017-05-17: 10 mg via ORAL
  Administered 2017-05-19: 5 mg via ORAL
  Administered 2017-05-20 – 2017-05-21 (×2): 10 mg via ORAL
  Filled 2017-05-17: qty 2
  Filled 2017-05-17: qty 1
  Filled 2017-05-17 (×3): qty 2

## 2017-05-17 MED ORDER — LIDOCAINE-PRILOCAINE 2.5-2.5 % EX CREA
1.0000 "application " | TOPICAL_CREAM | CUTANEOUS | Status: DC | PRN
  Filled 2017-05-17: qty 5

## 2017-05-17 MED ORDER — LEVOTHYROXINE SODIUM 75 MCG PO TABS
75.0000 ug | ORAL_TABLET | Freq: Every day | ORAL | Status: DC
  Administered 2017-05-17 – 2017-05-21 (×5): 75 ug via ORAL
  Filled 2017-05-17 (×5): qty 1

## 2017-05-17 NOTE — Progress Notes (Signed)
Nursing Note: Pt arrived via stretcher.Pt awake, alert and accompanied by daughter.Pt able to stand and walk to the bed.T- 98 P-83 R- 18 BP 153/70 PO2 98 %.wbb

## 2017-05-17 NOTE — Progress Notes (Signed)
PROGRESS NOTE                                                                                                                                                                                                             Patient Demographics:    Jessica [name], is a 76 y.o. female, DOB - 1941/07/21, OZD:[id number]  Admit date - 05/16/2017   Admitting Physician Reubin Milan, MD  Outpatient Primary MD for the patient is [name], MD  LOS - 1  Outpatient Specialists:  Dr Brantley Stage  Dr Marin Olp   Chief Complaint  Patient presents with  . Abscess    cancer patient       Brief Narrative   76 year old female with history of anxiety, depression, asthma, CTD, hyperlipidemia, hypertension, hypothyroidism, stage IIa invasive ductal carcinoma of the left breast (triple negative) status post left breast lumpectomy and sentinel lymph node dissection in May 2018 followed by first cycle of chemotherapy with Taxotere and carboplatin on 04/28/2017 who was seen in the ED on 6/26 with left eye rigidity wound infection with drainage. She was seen by surgery and performed I&D. She was discharged on 7 day course of oral doxycycline. The wound culture grew MSSA which was pansensitive. She completed antibiotics on 7/3 and saw her surgeon in the office about one week back. She was instructed to do wet-to-dry dressing and had home health nurse. During this time she was having off-and-on clear drainage from the wound. For past 2 days she was having increased pain over the left axillary wound site with nausea and chills. On the day of admission she noticed increased malodorous discharge from the wound and came to the ED.  In the ED she was afebrile. Had normal white count but mildly elevated lactic acid of 1.98. Patient started on empiric IV vancomycin and admitted to hospitalist service.    Subjective:   Patient complains of pain over the  left axillary site. Also reports pain over her left upper triceps which is new.  Assessment  & Plan :    Principal Problem:   Cellulitis of left axilla Has  malodorous discharge from the wound. Induration and tenderness on exam. Continue empiric IV vancomycin. Wound care consulted. I will obtain an ultrasound of her left axilla. Also consulted surgery to evaluate for possible abscess and need for further drainage.  Supportive care with oxycodone and Zofran.   Active Problems: Left corneal shingles. Was seen by Dr Kathlen Mody (ophthalmologist) and had a course of Valtrex. Since symptoms reoccurred recently she was placed back on Valtrex which is continued.    Essential hypertension Stable. Continue HCTZ and Lotensin.    Breast cancer of upper-inner quadrant of left female breast [facility]) Has had one cycle of chemotherapy. Follows with Dr.Ennever. I will put him on the consult list.    GERD (gastroesophageal reflux disease) Continue Protonix    Hypothyroidism Continue Synthroid.    Depression with anxiety Continue Effexor.     Code Status : Full code  Family Communication  : Daughter at bedside  Disposition Plan  : Home once improved  Barriers For Discharge : Active symptoms  Consults  :  [place] surgery Wound care  Procedures  : Ultrasound left axilla  DVT Prophylaxis  :   - Heparin   Lab Results  Component Value Date   PLT 388 05/16/2017    Antibiotics  :   Anti-infectives    Start     Dose/Rate Route Frequency Ordered Stop   05/17/17 1000  vancomycin (VANCOCIN) IVPB 750 mg/150 ml premix     750 mg 150 mL/hr over 60 Minutes Intravenous Every 12 hours 05/17/17 0006     05/17/17 1000  valACYclovir (VALTREX) tablet 1,000 mg     1,000 mg Oral 3 times daily 05/17/17 0448     05/17/17 0000  vancomycin (VANCOCIN) IVPB 1000 mg/200 mL premix     1,000 mg 200 mL/hr over 60 Minutes Intravenous NOW 05/16/17 2351 05/17/17 0101        Objective:   Vitals:   05/16/17  2350 05/17/17 0038 05/17/17 0431 05/17/17 0614  BP: 123/61 (!) 153/70 (!) 152/68 (!) 146/62  Pulse: 74 83 78 82  Resp: 18 18 18 18   Temp:  98 F (36.7 C) 98.5 F (36.9 C)   TempSrc:  Oral Oral   SpO2: 97% 98% 98% 96%  Weight:  71 kg (156 lb 8.4 oz)    Height:  5\' 2"  (1.575 m)      Wt Readings from Last 3 Encounters:  05/17/17 71 kg (156 lb 8.4 oz)  04/27/17 79.4 kg (175 lb)  04/26/17 78.7 kg (173 lb 6.4 oz)     Intake/Output Summary (Last 24 hours) at 05/17/17 0925 Last data filed at 05/17/17 0715  Gross per 24 hour  Intake              300 ml  Output              750 ml  Net             -450 ml     Physical Exam  Gen: not in distress, Appears fatigued HEENT: no pallor, moist mucosa, supple neck Chest: Open wound over left axilla, no drainage noted but with induration and mild erythema around. Tender to palpation. Has some tenderness to palpation over the left upper triceps area. Right-sided Port-A-Cath. Clear to auscultation bilaterally CVS: N S1&S2, no murmurs, rubs or gallop GI: soft, NT, ND,  Musculoskeletal: warm, no edema    Data Review:    CBC  Recent Labs Lab 05/16/17 2159  WBC 6.3  HGB 11.0*  HCT 33.0*  PLT 388  MCV 97.3  MCH 32.4  MCHC 33.3  RDW 14.9  LYMPHSABS 2.0  MONOABS 0.5  EOSABS 0.0  BASOSABS 0.0    Chemistries   Recent Labs  Lab 05/16/17 2159  NA 138  K 3.5  CL 98*  CO2 29  GLUCOSE 134*  BUN 18  CREATININE 0.75  CALCIUM 9.6  AST 27  ALT 24  ALKPHOS 74  BILITOT 0.3   ------------------------------------------------------------------------------------------------------------------ No results for input(s): CHOL, HDL, LDLCALC, TRIG, CHOLHDL, LDLDIRECT in the last 72 hours.  No results found for: HGBA1C ------------------------------------------------------------------------------------------------------------------ No results for input(s): TSH, T4TOTAL, T3FREE, THYROIDAB in the last 72 hours.  Invalid input(s):  FREET3 ------------------------------------------------------------------------------------------------------------------ No results for input(s): VITAMINB12, FOLATE, FERRITIN, TIBC, IRON, RETICCTPCT in the last 72 hours.  Coagulation profile No results for input(s): INR, PROTIME in the last 168 hours.  No results for input(s): DDIMER in the last 72 hours.  Cardiac Enzymes No results for input(s): CKMB, TROPONINI, MYOGLOBIN in the last 168 hours.  Invalid input(s): CK ------------------------------------------------------------------------------------------------------------------ No results found for: BNP  Inpatient Medications  Scheduled Meds: . benazepril  20 mg Oral Daily  . calcium-vitamin D  1 tablet Oral Daily  . heparin  5,000 Units Subcutaneous Q8H  . hydrochlorothiazide  12.5 mg Oral Daily  . levothyroxine  75 mcg Oral Daily  . pantoprazole  40 mg Oral Daily  . valACYclovir  1,000 mg Oral TID  . venlafaxine XR  225 mg Oral Daily   Continuous Infusions: . vancomycin     PRN Meds:.acetaminophen, diphenhydrAMINE, lidocaine-prilocaine, LORazepam, ondansetron, oxyCODONE, prochlorperazine  Micro Results No results found for this or any previous visit (from the past 240 hour(s)).  Radiology Reports No results found.  Time Spent in minutes  25   Louellen Molder M.D on 05/17/2017 at 9:25 AM  Between 7am to 7pm - Pager - [phone]  After 7pm go to [email or web address] - password [facility]  [facility] -  Office  [phone]

## 2017-05-17 NOTE — Consult Note (Signed)
[facility] Surgery Consult Note  Jessica [name] Mar 09, 1941  [id number].    Requesting MD: Flonnie Overman, MD  Chief Complaint/Reason for Consult: post-operative wound infection  HPI:  Jessica [name] is a 76 y/o female with a PMH anxiety, depression, asthma, HTN, HLD, hypothyroidism, and  Triple negative stage IIa ductal carcinoma of the left breat s/p lumpectomy and sentinel node dissection 5/22/8 by Dr. Brantley Stage. She is seen by Dr. Marin Olp and is scheduled for 4 cycles of chemotherapy followed by post-operative radiation therapy. She has undergone one round of chemo (04/28/17). On 6/26/ she presented to the ED and was diagnosed with post-operative infection of incision from axillary nodal dissection. I&D was performed in the ED and she was sent home on PO abx, would CX positive for pansensitive MSSA. She has been doing daily dressing changes at home since this time. Today she presents with worsening pain over wound, foul-smelling drainage, nausea, and chills/subjective fever. Patient reports that the drainage from the wound ranges from clear to blood tinged/brown. General surgery has been asked to evaluate the wound to determine the need for further drainage.   Labs are WNL with the exception of a mild elevation in lactate (1.98). U/S of Left axilla is pending. Patient was admitted to hospitalist service and started on IV vancomycin. Wound CX are pending.  ROS: Review of Systems  Constitutional: Positive for chills. Negative for fever.  Respiratory: Negative for shortness of breath.   Cardiovascular: Negative for chest pain.  Gastrointestinal: Positive for nausea.  Neurological: Negative.   All other systems reviewed and are negative.   Family History  Problem Relation Age of Onset  . Heart disease Mother        Angina  . Heart disease Father   . Cervical cancer Maternal Aunt   . Heart attack Brother        Died of MI in his mid 80s  . Prostate cancer Brother   . Colon cancer Neg Hx    . Pancreatic cancer Neg Hx   . Stomach cancer Neg Hx   . Esophageal cancer Neg Hx   . Rectal cancer Neg Hx     Past Medical History:  Diagnosis Date  . Allergy   . Anemia   . Anxiety   . Asthma    in past, no inhalers now  . Blood transfusion without reported diagnosis   . Breast cancer ([place]) 01/2017   left breast   . Breast cancer of upper-inner quadrant of left female breast ([place]) 03/11/2017  . Cancer ([place]) 01/2017   left breast  . Cataract    bilateral removed  . Chronic kidney disease    kidney stones  . Depression 08/31/2013  . Dyslipidemia, goal LDL below 160 08/31/2013  . Essential hypertension - well controlled 08/31/2013  . GERD (gastroesophageal reflux disease)   . Goals of care, counseling/discussion 03/11/2017  . Hypothyroidism   . Obesity (BMI 30-39.9) 08/31/2013  . Thyroid disease   . Vasovagal near-syncope -rare 08/31/2013    Past Surgical History:  Procedure Laterality Date  . ABDOMINAL HYSTERECTOMY     total  . BREAST BIOPSY Left 01/2017    malignant  . broken fingers    . CATARACT EXTRACTION Bilateral   . COLONOSCOPY    . EXCISIONAL HEMORRHOIDECTOMY    . kidney stones lithotripsy    . PORTACATH PLACEMENT Right 03/23/2017   Procedure: INSERTION PORT-A-CATH;  Surgeon: Erroll Luna, MD;  Location: [place];  Service: General;  Laterality: Right;  .  RADIOACTIVE SEED GUIDED MASTECTOMY WITH AXILLARY SENTINEL LYMPH NODE BIOPSY Left 03/23/2017   Procedure: LEFT BREAST RADIOACTIVE SEED GUIDED PARTIAL MASTECTOMY AND  SENTINEL LYMPH NODE MAPPING;  Surgeon: Erroll Luna, MD;  Location: [place];  Service: General;  Laterality: Left;  . ROTATOR CUFF REPAIR     left   . TONSILLECTOMY    . WISDOM TOOTH EXTRACTION      Social History:  reports that she has never smoked. She has never used smokeless tobacco. She reports that she does not drink alcohol or use drugs.  Allergies:  Allergies  Allergen Reactions  . Codeine  Hives  . Penicillins Hives    Has patient had a PCN reaction causing immediate rash, facial/tongue/throat swelling, SOB or lightheadedness with hypotension: yes Has patient had a PCN reaction causing severe rash involving mucus membranes or skin necrosis:  no Has patient had a PCN reaction that required hospitalization:  no Has patient had a PCN reaction occurring within the last 10 years: no If all of the above answers are "NO", then may proceed with Cephalosporin use.   . Advil [Ibuprofen] Hives and Itching  . Band-Aid Plus Antibiotic [Bacitracin-Polymyxin B] Dermatitis    Medications Prior to Admission  Medication Sig Dispense Refill  . benazepril (LOTENSIN) 20 MG tablet Take 20 mg by mouth daily.    . calcium citrate-vitamin D (CITRACAL+D) 315-200 MG-UNIT per tablet Take 1 tablet by mouth daily.    . diphenhydramine-acetaminophen (TYLENOL PM) 25-500 MG TABS Take 1 tablet by mouth at bedtime as needed (sleep).     . hydrochlorothiazide (MICROZIDE) 12.5 MG capsule Take 12.5 mg by mouth daily.  1  . levothyroxine (SYNTHROID, LEVOTHROID) 75 MCG tablet Take 75 mcg by mouth daily.    Marland Kitchen lidocaine-prilocaine (EMLA) cream Apply 1 application topically as needed. 30 g 6  . LORazepam (ATIVAN) 0.5 MG tablet Take 1 tablet (0.5 mg total) by mouth every 6 (six) hours as needed (Nausea or vomiting). 30 tablet 0  . Lysine 1000 MG TABS Take 1,000 mg by mouth as needed.    . Multiple Vitamin (MULTIVITAMIN) tablet Take 1 tablet by mouth daily.    Marland Kitchen omeprazole (PRILOSEC OTC) 20 MG tablet Take 20 mg by mouth daily.    . ondansetron (ZOFRAN) 8 MG tablet Take 1 tablet (8 mg total) by mouth 2 (two) times daily as needed for refractory nausea / vomiting. Start on day 3 after chemo. 30 tablet 1  . OVER THE COUNTER MEDICATION Take 15 mLs by mouth 2 (two) times daily as needed (pain).    . prochlorperazine (COMPAZINE) 10 MG tablet TAKE 1 TABLET(10 MG) BY MOUTH EVERY 6 HOURS AS NEEDED FOR NAUSEA OR VOMITING 385  tablet 1  . valACYclovir (VALTREX) 1000 MG tablet Take 1 g by mouth 3 (three) times daily.  0  . venlafaxine XR (EFFEXOR-XR) 75 MG 24 hr capsule Take 225 mg by mouth daily.    . celecoxib (CELEBREX) 100 MG capsule Take 1 capsule (100 mg total) by mouth 2 (two) times daily. (Patient not taking: Reported on 05/17/2017) 20 capsule 0  . dexamethasone (DECADRON) 4 MG tablet Take 2 pills with food every 12 hours for 4 days.  Start day before each chemotherapy session. (Patient not taking: Reported on 05/17/2017) 60 tablet 1  . oxyCODONE (OXY IR/ROXICODONE) 5 MG immediate release tablet Take 1-2 tablets (5-10 mg total) by mouth every 6 (six) hours as needed for severe pain. (Patient not taking: Reported on 05/17/2017) 10  tablet 0  . prednisoLONE acetate (PRED FORTE) 1 % ophthalmic suspension Place 2 drops into both eyes 3 (three) times daily. For 7 days. (Patient not taking: Reported on 05/17/2017) 10 mL 0    Blood pressure (!) 146/62, pulse 82, temperature 98.5 F (36.9 C), temperature source Oral, resp. rate 18, height 5\' 2"  (1.575 m), weight 71 kg (156 lb 8.4 oz), SpO2 96 %. Physical Exam: Physical Exam  Constitutional: She is oriented to person, place, and time. She appears well-developed and well-nourished. No distress.  HENT:  Head: Normocephalic and atraumatic.  Right Ear: External ear normal.  Left Ear: External ear normal.  Nose: Nose normal.  Eyes: EOM are normal. Right eye exhibits no discharge. Left eye exhibits no discharge. No scleral icterus.  Neck: Normal range of motion. Neck supple. No tracheal deviation present.  Cardiovascular: Normal rate, regular rhythm, normal heart sounds and intact distal pulses.   Pulmonary/Chest: Effort normal and breath sounds normal. No stridor. No respiratory distress. She has no rales.  Abdominal: Soft. Bowel sounds are normal. She exhibits no distension and no mass. There is no tenderness. There is no guarding.  Musculoskeletal: Normal range of motion.  She exhibits tenderness. She exhibits no deformity.  Neurological: She is alert and oriented to person, place, and time. No sensory deficit.  Skin: Skin is warm and dry. No rash noted. She is not diaphoretic.  Left axilla with 3-4 cm incision, roughly 1/2 cm in depth. Mild induration superior to incision with < 2 cm surrounding induration. No active drainage from wound on exam. Wound base is clean. No appreciable cellulitis. Left posterior arm is mildly TTP but there is no cellulitis or significant induration of the arm.   Psychiatric: Her behavior is normal.  Flat affect    Results for orders placed or performed during the hospital encounter of 05/16/17 (from the past 48 hour(s))  Urinalysis, Routine w reflex microscopic     Status: Abnormal   Collection Time: 05/16/17  9:57 PM  Result Value Ref Range   Color, Urine YELLOW YELLOW   APPearance CLEAR CLEAR   Specific Gravity, Urine 1.017 1.005 - 1.030   pH 5.0 5.0 - 8.0   Glucose, UA NEGATIVE NEGATIVE mg/dL   Hgb urine dipstick NEGATIVE NEGATIVE   Bilirubin Urine NEGATIVE NEGATIVE   Ketones, ur NEGATIVE NEGATIVE mg/dL   Protein, ur NEGATIVE NEGATIVE mg/dL   Nitrite NEGATIVE NEGATIVE   Leukocytes, UA TRACE (A) NEGATIVE   RBC / HPF 0-5 0 - 5 RBC/hpf   WBC, UA 6-30 0 - 5 WBC/hpf   Bacteria, UA FEW (A) NONE SEEN   Squamous Epithelial / LPF 0-5 (A) NONE SEEN   Mucous PRESENT    Hyaline Casts, UA PRESENT   Comprehensive metabolic panel     Status: Abnormal   Collection Time: 05/16/17  9:59 PM  Result Value Ref Range   Sodium 138 135 - 145 mmol/L   Potassium 3.5 3.5 - 5.1 mmol/L   Chloride 98 (L) 101 - 111 mmol/L   CO2 29 22 - 32 mmol/L   Glucose, Bld 134 (H) 65 - 99 mg/dL   BUN 18 6 - 20 mg/dL   Creatinine, Ser 05/18/17 0.44 - 1.00 mg/dL   Calcium 9.6 8.9 - 9.86 mg/dL   Total Protein 7.0 6.5 - 8.1 g/dL   Albumin 3.3 (L) 3.5 - 5.0 g/dL   AST 27 15 - 41 U/L   ALT 24 14 - 54 U/L   Alkaline Phosphatase 74  38 - 126 U/L   Total Bilirubin  0.3 0.3 - 1.2 mg/dL   GFR calc non Af Amer >60 >60 mL/min   GFR calc Af Amer >60 >60 mL/min    Comment: (NOTE) The eGFR has been calculated using the CKD EPI equation. This calculation has not been validated in all clinical situations. eGFR's persistently <60 mL/min signify possible Chronic Kidney Disease.    Anion gap 11 5 - 15  CBC with Differential     Status: Abnormal   Collection Time: 05/16/17  9:59 PM  Result Value Ref Range   WBC 6.3 4.0 - 10.5 K/uL   RBC 3.39 (L) 3.87 - 5.11 MIL/uL   Hemoglobin 11.0 (L) 12.0 - 15.0 g/dL   HCT 33.0 (L) 36.0 - 46.0 %   MCV 97.3 78.0 - 100.0 fL   MCH 32.4 26.0 - 34.0 pg   MCHC 33.3 30.0 - 36.0 g/dL   RDW 14.9 11.5 - 15.5 %   Platelets 388 150 - 400 K/uL   Neutrophils Relative % 60 %   Neutro Abs 3.8 1.7 - 7.7 K/uL   Lymphocytes Relative 32 %   Lymphs Abs 2.0 0.7 - 4.0 K/uL   Monocytes Relative 8 %   Monocytes Absolute 0.5 0.1 - 1.0 K/uL   Eosinophils Relative 0 %   Eosinophils Absolute 0.0 0.0 - 0.7 K/uL   Basophils Relative 0 %   Basophils Absolute 0.0 0.0 - 0.1 K/uL  I-Stat CG4 Lactic Acid, ED     Status: Abnormal   Collection Time: 05/16/17 10:10 PM  Result Value Ref Range   Lactic Acid, Venous 1.98 (H) 0.5 - 1.9 mmol/L  Wound or Superficial Culture     Status: None (Preliminary result)   Collection Time: 05/16/17 11:27 PM  Result Value Ref Range   Specimen Description WOUND LEFT AXILLA    Special Requests NONE    Gram Stain      FEW WBC PRESENT,BOTH PMN AND MONONUCLEAR RARE SQUAMOUS EPITHELIAL CELLS PRESENT RARE GRAM POSITIVE COCCI IN PAIRS RARE GRAM POSITIVE RODS Performed at [facility], [address] [place], [place] [address]    Culture PENDING    Report Status PENDING   Lactic acid, plasma     Status: None   Collection Time: 05/17/17  3:03 AM  Result Value Ref Range   Lactic Acid, Venous 1.1 0.5 - 1.9 mmol/L  Lactic acid, plasma     Status: None   Collection Time: 05/17/17  6:55 AM  Result Value Ref  Range   Lactic Acid, Venous 1.2 0.5 - 1.9 mmol/L   No results found.  Assessment/Plan Left axillary wound - wound appears mostly clean without significant cellulitis and minimal induration. No appreciable fluctuance. - recommend IV abx, follow wound CX. - will follow ultrasound and if positive for residual abscess will re-open wound for adequate drainage.  - local wound care: Clean skin daily with mild soap and water. Dry skin/wound thoroughly. Daily wet to dry dressings.   Ductal carcinoma stage IIa, triple negative - Chemotherapy per Marin Olp, MD GERD HTN HLD Hypothyroidism Asthma Anxiety/depression VTE- Lovenox  Jill Alexanders, [facility] Surgery 05/17/2017, 11:55 AM Pager: [phone] Consults: [phone] Mon-Fri 7:00 am-4:30 pm Sat-Sun 7:00 am-11:30 am

## 2017-05-17 NOTE — Progress Notes (Signed)
Nursing Note: Daughter requests benadryl for itching in palm of r hand.Concerned pt may be reacting to Vancomycin.A; Paged on call.Orders received for po benadryl 50 mg.wbb

## 2017-05-17 NOTE — Progress Notes (Signed)
Nursing Note: Resting well ,medicated for pain earlier.No further c/o of itching.wbb

## 2017-05-17 NOTE — Consult Note (Signed)
[place] Nurse wound consult note Reason for Consult: Left axilla wound from lymph node removal 03/2017 Wound type: Infectious  Pressure Injury POA: NA Measurement: 3 cm x 0.5 cm x 0.5 cm  Wound EHM:[id number] visualize Drainage (amount, consistency, odor) minimal serosanguinous  No odor Periwound:intact.  No erythema or induration noted today.  Dressing procedure/placement/frequency:Cleanse surgical site to left axilla with NS and pat gently dry.  Apply NS moist gauze to wound bed.  Cover with 2x2 gauze and tape. Change daily.  Will not follow at this time.  Please re-consult if needed.  Domenic Moras RN BSN [place] Pager [phone]

## 2017-05-18 ENCOUNTER — Encounter (HOSPITAL_COMMUNITY): Payer: Self-pay | Admitting: Certified Registered"

## 2017-05-18 ENCOUNTER — Inpatient Hospital Stay (HOSPITAL_COMMUNITY): Payer: Medicare Other | Admitting: Anesthesiology

## 2017-05-18 ENCOUNTER — Inpatient Hospital Stay (HOSPITAL_COMMUNITY): Payer: Medicare Other

## 2017-05-18 ENCOUNTER — Encounter (HOSPITAL_COMMUNITY)
Admission: EM | Disposition: A | Discharge status: Home / Self Care | Payer: Self-pay | Source: Home / Self Care | Attending: Internal Medicine

## 2017-05-18 DIAGNOSIS — L02412 Cutaneous abscess of left axilla: Principal | ICD-10-CM

## 2017-05-18 DIAGNOSIS — N6489 Other specified disorders of breast: Secondary | ICD-10-CM

## 2017-05-18 DIAGNOSIS — Z171 Estrogen receptor negative status [ER-]: Secondary | ICD-10-CM

## 2017-05-18 DIAGNOSIS — B958 Unspecified staphylococcus as the cause of diseases classified elsewhere: Secondary | ICD-10-CM

## 2017-05-18 DIAGNOSIS — L03112 Cellulitis of left axilla: Secondary | ICD-10-CM

## 2017-05-18 DIAGNOSIS — C50912 Malignant neoplasm of unspecified site of left female breast: Secondary | ICD-10-CM

## 2017-05-18 HISTORY — PX: IRRIGATION AND DEBRIDEMENT ABSCESS: SHX5252

## 2017-05-18 LAB — BASIC METABOLIC PANEL
Anion gap: 7 (ref 5–15)
BUN: 9 mg/dL (ref 6–20)
CO2: 30 mmol/L (ref 22–32)
Calcium: 9.4 mg/dL (ref 8.9–10.3)
Chloride: 102 mmol/L (ref 101–111)
Creatinine, Ser: 0.64 mg/dL (ref 0.44–1.00)
GFR calc Af Amer: >60 mL/min (ref >60)
GFR calc non Af Amer: >60 mL/min (ref >60)
Glucose, Bld: 88 mg/dL (ref 65–99)
Potassium: 3.9 mmol/L (ref 3.5–5.1)
Sodium: 139 mmol/L (ref 135–145)

## 2017-05-18 LAB — CBC WITH DIFFERENTIAL/PLATELET
Basophils Absolute: 0 10*3/uL (ref 0.0–0.1)
Basophils Relative: 0 %
Eosinophils Absolute: 0 10*3/uL (ref 0.0–0.7)
Eosinophils Relative: 0 %
HCT: 33.2 % — ABNORMAL LOW (ref 36.0–46.0)
Hemoglobin: 11 g/dL — ABNORMAL LOW (ref 12.0–15.0)
Lymphocytes Relative: 29 %
Lymphs Abs: 1.3 10*3/uL (ref 0.7–4.0)
MCH: 32.1 pg (ref 26.0–34.0)
MCHC: 33.1 g/dL (ref 30.0–36.0)
MCV: 96.8 fL (ref 78.0–100.0)
Monocytes Absolute: 0.3 10*3/uL (ref 0.1–1.0)
Monocytes Relative: 7 %
Neutro Abs: 2.9 10*3/uL (ref 1.7–7.7)
Neutrophils Relative %: 64 %
Platelets: 334 10*3/uL (ref 150–400)
RBC: 3.43 MIL/uL — ABNORMAL LOW (ref 3.87–5.11)
RDW: 15 % (ref 11.5–15.5)
WBC: 4.5 10*3/uL (ref 4.0–10.5)

## 2017-05-18 SURGERY — IRRIGATION AND DEBRIDEMENT ABSCESS
Anesthesia: General | Site: Breast | Laterality: Left

## 2017-05-18 MED ORDER — SUCCINYLCHOLINE CHLORIDE 200 MG/10ML IV SOSY
PREFILLED_SYRINGE | INTRAVENOUS | Status: DC | PRN
  Administered 2017-05-18: 100 mg via INTRAVENOUS

## 2017-05-18 MED ORDER — ALUM & MAG HYDROXIDE-SIMETH 200-200-20 MG/5ML PO SUSP: 30.0000 mL | Freq: Four times a day (QID) | ORAL | Status: DC | PRN

## 2017-05-18 MED ORDER — PROPOFOL 10 MG/ML IV BOLUS
INTRAVENOUS | Status: AC
  Filled 2017-05-18: qty 20

## 2017-05-18 MED ORDER — MIDAZOLAM HCL 2 MG/2ML IJ SOLN
INTRAMUSCULAR | Status: AC
  Filled 2017-05-18: qty 2

## 2017-05-18 MED ORDER — FENTANYL CITRATE (PF) 250 MCG/5ML IJ SOLN
INTRAMUSCULAR | Status: DC | PRN
  Administered 2017-05-18 (×2): 50 ug via INTRAVENOUS

## 2017-05-18 MED ORDER — BUPIVACAINE-EPINEPHRINE 0.25% -1:200000 IJ SOLN
INTRAMUSCULAR | Status: AC
  Filled 2017-05-18: qty 1

## 2017-05-18 MED ORDER — LACTATED RINGERS IV SOLN
INTRAVENOUS | Status: DC | PRN
  Administered 2017-05-18 (×2): via INTRAVENOUS

## 2017-05-18 MED ORDER — CELECOXIB 100 MG PO CAPS
100.0000 mg | ORAL_CAPSULE | Freq: Two times a day (BID) | ORAL | Status: DC
  Administered 2017-05-19 – 2017-05-21 (×5): 100 mg via ORAL
  Filled 2017-05-18 (×5): qty 1

## 2017-05-18 MED ORDER — SODIUM CHLORIDE 0.9% FLUSH
3.0000 mL | Freq: Two times a day (BID) | INTRAVENOUS | Status: DC
  Administered 2017-05-18 – 2017-05-20 (×5): 3 mL via INTRAVENOUS

## 2017-05-18 MED ORDER — EPHEDRINE SULFATE-NACL 50-0.9 MG/10ML-% IV SOSY
PREFILLED_SYRINGE | INTRAVENOUS | Status: DC | PRN
  Administered 2017-05-18: 10 mg via INTRAVENOUS
  Administered 2017-05-18: 5 mg via INTRAVENOUS
  Administered 2017-05-18: 15 mg via INTRAVENOUS
  Administered 2017-05-18: 20 mg via INTRAVENOUS

## 2017-05-18 MED ORDER — MIDAZOLAM HCL 5 MG/5ML IJ SOLN
INTRAMUSCULAR | Status: DC | PRN
  Administered 2017-05-18 (×2): 1 mg via INTRAVENOUS

## 2017-05-18 MED ORDER — PROCHLORPERAZINE EDISYLATE 5 MG/ML IJ SOLN: 5.0000 mg | INTRAMUSCULAR | Status: DC | PRN

## 2017-05-18 MED ORDER — FENTANYL CITRATE (PF) 100 MCG/2ML IJ SOLN
INTRAMUSCULAR | Status: DC | PRN
Start: 2017-05-18 — End: 2017-05-18
  Administered 2017-05-18: 100 ug via INTRAVENOUS

## 2017-05-18 MED ORDER — FENTANYL CITRATE (PF) 250 MCG/5ML IJ SOLN
INTRAMUSCULAR | Status: AC
  Filled 2017-05-18: qty 5

## 2017-05-18 MED ORDER — DEXTROSE 5 % IV SOLN
1000.0000 mg | Freq: Four times a day (QID) | INTRAVENOUS | Status: DC | PRN
  Administered 2017-05-21: 1000 mg via INTRAVENOUS
  Filled 2017-05-18 (×2): qty 10

## 2017-05-18 MED ORDER — LACTATED RINGERS IV SOLN: INTRAVENOUS | Status: DC

## 2017-05-18 MED ORDER — BUPIVACAINE-EPINEPHRINE 0.25% -1:200000 IJ SOLN
INTRAMUSCULAR | Status: DC | PRN
Start: 2017-05-18 — End: 2017-05-18
  Administered 2017-05-18: 50 mL

## 2017-05-18 MED ORDER — HYDROCORTISONE 1 % EX CREA
1.0000 | TOPICAL_CREAM | Freq: Three times a day (TID) | CUTANEOUS | Status: DC | PRN
Start: 2017-05-18 — End: 2017-05-21

## 2017-05-18 MED ORDER — MEPERIDINE HCL 50 MG/ML IJ SOLN: 6.2500 mg | INTRAMUSCULAR | Status: DC | PRN

## 2017-05-18 MED ORDER — SODIUM CHLORIDE 0.9 % IV SOLN: 250.0000 mL | INTRAVENOUS | Status: DC | PRN

## 2017-05-18 MED ORDER — POLYETHYLENE GLYCOL 3350 17 G PO PACK
17.0000 g | PACK | Freq: Two times a day (BID) | ORAL | Status: DC | PRN
  Administered 2017-05-20: 17 g via ORAL
  Filled 2017-05-18: qty 1

## 2017-05-18 MED ORDER — LIP MEDEX EX OINT
1.0000 "application " | TOPICAL_OINTMENT | Freq: Two times a day (BID) | CUTANEOUS | Status: DC
  Administered 2017-05-19 – 2017-05-21 (×5): 1 via TOPICAL
  Filled 2017-05-18 (×2): qty 7

## 2017-05-18 MED ORDER — LIDOCAINE 2% (20 MG/ML) 5 ML SYRINGE
INTRAMUSCULAR | Status: AC
  Filled 2017-05-18: qty 5

## 2017-05-18 MED ORDER — DIPHENHYDRAMINE HCL 50 MG/ML IJ SOLN
12.5000 mg | Freq: Four times a day (QID) | INTRAMUSCULAR | Status: DC | PRN
  Administered 2017-05-19: 25 mg via INTRAVENOUS
  Filled 2017-05-18 (×2): qty 1

## 2017-05-18 MED ORDER — PROPOFOL 10 MG/ML IV BOLUS
INTRAVENOUS | Status: DC | PRN
Start: 2017-05-18 — End: 2017-05-18
  Administered 2017-05-18: 150 mg via INTRAVENOUS

## 2017-05-18 MED ORDER — HYDROCORTISONE 2.5 % RE CREA: 1.0000 "application " | TOPICAL_CREAM | Freq: Four times a day (QID) | RECTAL | Status: DC | PRN

## 2017-05-18 MED ORDER — BUPIVACAINE-EPINEPHRINE (PF) 0.5% -1:200000 IJ SOLN
INTRAMUSCULAR | Status: AC
  Filled 2017-05-18: qty 30

## 2017-05-18 MED ORDER — PROMETHAZINE HCL 25 MG/ML IJ SOLN: 6.2500 mg | INTRAMUSCULAR | Status: DC | PRN

## 2017-05-18 MED ORDER — GUAIFENESIN-DM 100-10 MG/5ML PO SYRP: 10.0000 mL | ORAL_SOLUTION | ORAL | Status: DC | PRN

## 2017-05-18 MED ORDER — CHLORHEXIDINE GLUCONATE CLOTH 2 % EX PADS
6.0000 | MEDICATED_PAD | Freq: Once | CUTANEOUS | Status: AC
  Administered 2017-05-18: 6 via TOPICAL

## 2017-05-18 MED ORDER — HYDROMORPHONE HCL-NACL 0.5-0.9 MG/ML-% IV SOSY: 0.2500 mg | PREFILLED_SYRINGE | INTRAVENOUS | Status: DC | PRN

## 2017-05-18 MED ORDER — SUCCINYLCHOLINE CHLORIDE 200 MG/10ML IV SOSY
PREFILLED_SYRINGE | INTRAVENOUS | Status: AC
  Filled 2017-05-18: qty 10

## 2017-05-18 MED ORDER — CHLORHEXIDINE GLUCONATE CLOTH 2 % EX PADS: 6.0000 | MEDICATED_PAD | Freq: Once | CUTANEOUS | Status: DC

## 2017-05-18 MED ORDER — LIDOCAINE 2% (20 MG/ML) 5 ML SYRINGE
INTRAMUSCULAR | Status: DC | PRN
  Administered 2017-05-18: 80 mg via INTRAVENOUS

## 2017-05-18 MED ORDER — SODIUM CHLORIDE 0.9% FLUSH: 3.0000 mL | INTRAVENOUS | Status: DC | PRN

## 2017-05-18 MED ORDER — MAGIC MOUTHWASH
15.0000 mL | Freq: Four times a day (QID) | ORAL | Status: DC | PRN
  Filled 2017-05-18: qty 15

## 2017-05-18 MED ORDER — METHYLENE BLUE 0.5 % INJ SOLN
INTRAVENOUS | Status: AC
  Filled 2017-05-18: qty 10

## 2017-05-18 MED ORDER — 0.9 % SODIUM CHLORIDE (POUR BTL) OPTIME
TOPICAL | Status: DC | PRN
  Administered 2017-05-18: 1000 mL

## 2017-05-18 MED ORDER — ONDANSETRON HCL 4 MG/2ML IJ SOLN
INTRAMUSCULAR | Status: DC | PRN
  Administered 2017-05-18: 4 mg via INTRAVENOUS

## 2017-05-18 MED ORDER — PHENOL 1.4 % MT LIQD: 1.0000 | OROMUCOSAL | Status: DC | PRN

## 2017-05-18 MED ORDER — MENTHOL 3 MG MT LOZG
1.0000 | LOZENGE | OROMUCOSAL | Status: DC | PRN
  Filled 2017-05-18: qty 9

## 2017-05-18 MED ORDER — FENTANYL CITRATE (PF) 100 MCG/2ML IJ SOLN
INTRAMUSCULAR | Status: AC
  Filled 2017-05-18: qty 2

## 2017-05-18 MED ORDER — PHENYLEPHRINE 40 MCG/ML (10ML) SYRINGE FOR IV PUSH (FOR BLOOD PRESSURE SUPPORT)
PREFILLED_SYRINGE | INTRAVENOUS | Status: DC | PRN
  Administered 2017-05-18 (×2): 200 ug via INTRAVENOUS

## 2017-05-18 SURGICAL SUPPLY — 31 items
BLADE SURG 15 STRL LF DISP TIS (BLADE) ×1 IMPLANT
BLADE SURG 15 STRL SS (BLADE) ×1
BNDG CONFORM 2 STRL LF (GAUZE/BANDAGES/DRESSINGS) ×2 IMPLANT
BRIEF STRETCH FOR OB PAD LRG (UNDERPADS AND DIAPERS) IMPLANT
COVER SURGICAL LIGHT HANDLE (MISCELLANEOUS) ×2 IMPLANT
DECANTER SPIKE VIAL GLASS SM (MISCELLANEOUS) ×2 IMPLANT
DRAPE LAPAROTOMY T 102X78X121 (DRAPES) ×2 IMPLANT
DRSG PAD ABDOMINAL 8X10 ST (GAUZE/BANDAGES/DRESSINGS) IMPLANT
ELECT PENCIL ROCKER SW 15FT (MISCELLANEOUS) ×2 IMPLANT
ELECT REM PT RETURN 15FT ADLT (MISCELLANEOUS) ×2 IMPLANT
GAUZE SPONGE 4X4 12PLY STRL (GAUZE/BANDAGES/DRESSINGS) ×2 IMPLANT
GAUZE SPONGE 4X4 16PLY XRAY LF (GAUZE/BANDAGES/DRESSINGS) ×2 IMPLANT
GLOVE BIOGEL PI IND STRL 7.0 (GLOVE) ×1 IMPLANT
GLOVE BIOGEL PI INDICATOR 7.0 (GLOVE) ×1
GLOVE ECLIPSE 8.0 STRL XLNG CF (GLOVE) ×2 IMPLANT
GLOVE INDICATOR 8.0 STRL GRN (GLOVE) ×2 IMPLANT
GLOVE SURG SS PI 6.5 STRL IVOR (GLOVE) ×2 IMPLANT
GOWN STRL REUS W/TWL XL LVL3 (GOWN DISPOSABLE) ×4 IMPLANT
KIT BASIN OR (CUSTOM PROCEDURE TRAY) ×2 IMPLANT
LUBRICANT JELLY K Y 4OZ (MISCELLANEOUS) IMPLANT
NEEDLE HYPO 22GX1.5 SAFETY (NEEDLE) ×2 IMPLANT
NS IRRIG 1000ML POUR BTL (IV SOLUTION) ×2 IMPLANT
PACK BASIC VI WITH GOWN DISP (CUSTOM PROCEDURE TRAY) ×2 IMPLANT
PAD ABD 8X10 STRL (GAUZE/BANDAGES/DRESSINGS) ×2 IMPLANT
SWAB COLLECTION DEVICE MRSA (MISCELLANEOUS) ×4 IMPLANT
SYR 20CC LL (SYRINGE) ×2 IMPLANT
SYR BULB IRRIGATION 50ML (SYRINGE) ×2 IMPLANT
TAPE CLOTH SURG 4X10 WHT LF (GAUZE/BANDAGES/DRESSINGS) ×2 IMPLANT
TOWEL OR 17X26 10 PK STRL BLUE (TOWEL DISPOSABLE) ×2 IMPLANT
TOWEL OR NON WOVEN STRL DISP B (DISPOSABLE) ×2 IMPLANT
YANKAUER SUCT BULB TIP 10FT TU (MISCELLANEOUS) ×2 IMPLANT

## 2017-05-18 NOTE — Anesthesia Preprocedure Evaluation (Addendum)
Anesthesia Evaluation  Patient identified by MRN, date of birth, ID band Patient awake    Reviewed: Allergy & Precautions, NPO status , Patient's Chart, lab work & pertinent test results  Airway Mallampati: III     Mouth opening: Limited Mouth Opening  Dental  (+) Teeth Intact, Dental Advisory Given, Caps   Pulmonary asthma ,    breath sounds clear to auscultation       Cardiovascular hypertension, Pt. on medications negative cardio ROS   Rhythm:Regular Rate:Normal     Neuro/Psych PSYCHIATRIC DISORDERS Anxiety Depression negative neurological ROS     GI/Hepatic Neg liver ROS, GERD  Medicated,  Endo/Other  Hypothyroidism   Renal/GU Renal disease     Musculoskeletal negative musculoskeletal ROS (+)   Abdominal   Peds  Hematology negative hematology ROS (+)   Anesthesia Other Findings Day of surgery medications reviewed with the patient.  Reproductive/Obstetrics                            Anesthesia Physical Anesthesia Plan  ASA: II  Anesthesia Plan: General   Post-op Pain Management:    Induction: Intravenous  PONV Risk Score and Plan: 4 or greater and Ondansetron, Dexamethasone, Propofol, Midazolam and Scopolamine patch - Pre-op  Airway Management Planned: Oral ETT and Video Laryngoscope Planned  Additional Equipment:   Intra-op Plan:   Post-operative Plan: Extubation in OR  Informed Consent: I have reviewed the patients History and Physical, chart, labs and discussed the procedure including the risks, benefits and alternatives for the proposed anesthesia with the patient or authorized representative who has indicated his/her understanding and acceptance.   Dental advisory given  Plan Discussed with: CRNA  Anesthesia Plan Comments:        Anesthesia Quick Evaluation

## 2017-05-18 NOTE — Anesthesia Procedure Notes (Signed)
Procedure Name: Intubation Date/Time: 05/18/2017 7:10 PM Performed by: Cynda Familia Pre-anesthesia Checklist: Patient identified, Emergency Drugs available, Suction available and Patient being monitored Patient Re-evaluated:Patient Re-evaluated prior to induction Oxygen Delivery Method: Circle System Utilized Preoxygenation: Pre-oxygenation with 100% oxygen Induction Type: IV induction, Cricoid Pressure applied and Rapid sequence Ventilation: Mask ventilation without difficulty Grade View: Grade I Tube type: Oral Tube size: 7.0 mm Number of attempts: 1 Airway Equipment and Method: Stylet and Video-laryngoscopy Placement Confirmation: ETT inserted through vocal cords under direct vision,  positive ETCO2 and breath sounds checked- equal and bilateral Secured at: 22 cm Tube secured with: Tape Dental Injury: Teeth and Oropharynx as per pre-operative assessment  Comments: Smooth RSI by Smith Robert--- intubation AM CRNA with Glidescope--- atraumatic (teeth and mouth as preop)-- bilat BS [place]

## 2017-05-18 NOTE — Transfer of Care (Signed)
Immediate Anesthesia Transfer of Care Note  Patient: Jessica [name]  Procedure(s) Performed: Procedure(s): IRRIGATION AND DEBRIDEMENT LEFT AXILLARY ABSCESS (Left)  Patient Location: PACU  Anesthesia Type:General  Level of Consciousness: awake and alert   Airway & Oxygen Therapy: Patient Spontanous Breathing and Patient connected to face mask oxygen  Post-op Assessment: Report given to RN and Post -op Vital signs reviewed and stable  Post vital signs: Reviewed and stable  Last Vitals:  Vitals:   05/18/17 0423 05/18/17 1400  BP: (!) 124/59 119/71  Pulse: 62 69  Resp: 16 18  Temp: 36.9 C 36.6 C    Last Pain:  Vitals:   05/18/17 1400  TempSrc: Oral  PainSc:       Patients Stated Pain Goal: 0 (28/41/32 4401)  Complications: No apparent anesthesia complications

## 2017-05-18 NOTE — H&P (View-Only) (Signed)
Patient ID: Jessica [name], female   DOB: Feb 19, 1941, 76 y.o.   MRN: [id number]  [facility] Surgery Progress Note     Subjective: CC- left axillary abscess Daughter at bedside. Axilla u/s yesterday showed a 4.8 x 1.6 x 4.2 cm fluid collection consistent with abscess. Planning to go to OR today for drainage. Patient has no new complaints.   Objective: Vital signs in last 24 hours: Temp:  [98 F (36.7 C)-98.5 F (36.9 C)] 98.4 F (36.9 C) (07/17 0423) Pulse Rate:  [62-69] 62 (07/17 0423) Resp:  [16-18] 16 (07/17 0423) BP: (121-136)/(54-62) 124/59 (07/17 0423) SpO2:  [94 %-98 %] 98 % (07/17 0423) Last BM Date: 05/17/17  Intake/Output from previous day: 07/16 0701 - 07/17 0700 In: 450 [IV Piggyback:150] Out: 800 [Urine:800] Intake/Output this shift: No intake/output data recorded.  PE: Gen:  Alert, NAD, pleasant HEENT: EOM's intact, pupils equal  Card:  RRR, no M/G/R heard Pulm:  CTAB, no W/R/R, effort normal Abd: Soft, NT/ND, +BS, no HSM, no hernia Psych: A&Ox3  Skin: no rashes noted, warm and dry Left axilla: ~2x2x0.5cm with surrounding induration, no active drainage or surrounding erythema  Lab Results:   Recent Labs  05/16/17 2159 05/17/17 0807  WBC 6.3 4.0  HGB 11.0* 9.8*  HCT 33.0* 29.6*  PLT 388 315   BMET  Recent Labs  05/16/17 2159 05/17/17 0807  NA 138 139  K 3.5 3.8  CL 98* 107  CO2 29 29  GLUCOSE 134* 122*  BUN 18 12  CREATININE 0.75 0.60  CALCIUM 9.6 8.8*   PT/INR No results for input(s): LABPROT, INR in the last 72 hours. CMP     Component Value Date/Time   NA 139 05/17/2017 0807   NA 129 04/26/2017 0827   NA 139 04/12/2017 1043   K 3.8 05/17/2017 0807   K 3.1 (L) 04/26/2017 0827   K 3.9 04/12/2017 1043   CL 107 05/17/2017 0807   CL 96 (L) 04/26/2017 0827   CO2 29 05/17/2017 0807   CO2 29 04/26/2017 0827   CO2 28 04/12/2017 1043   GLUCOSE 122 (H) 05/17/2017 0807   GLUCOSE 157 (H) 04/26/2017 0827   BUN 12 05/17/2017  0807   BUN 10 04/26/2017 0827   BUN 12.3 04/12/2017 1043   CREATININE 0.60 05/17/2017 0807   CREATININE 0.7 04/26/2017 0827   CREATININE 0.8 04/12/2017 1043   CALCIUM 8.8 (L) 05/17/2017 0807   CALCIUM 9.2 04/26/2017 0827   CALCIUM 9.8 04/12/2017 1043   PROT 7.0 05/16/2017 2159   PROT 7.1 04/26/2017 0827   PROT 6.9 04/12/2017 1043   ALBUMIN 3.3 (L) 05/16/2017 2159   ALBUMIN 3.1 (L) 04/26/2017 0827   ALBUMIN 3.5 04/12/2017 1043   AST 27 05/16/2017 2159   AST 27 04/26/2017 0827   AST 15 04/12/2017 1043   ALT 24 05/16/2017 2159   ALT 30 04/26/2017 0827   ALT 13 04/12/2017 1043   ALKPHOS 74 05/16/2017 2159   ALKPHOS 58 04/26/2017 0827   ALKPHOS 61 04/12/2017 1043   BILITOT 0.3 05/16/2017 2159   BILITOT 0.60 04/26/2017 0827   BILITOT 0.27 04/12/2017 1043   GFRNONAA >60 05/17/2017 0807   GFRAA >60 05/17/2017 0807   Lipase  No results found for: LIPASE     Studies/Results: [place] Lt Upper Extrem Ltd Soft Tissue Non Vascular  Result Date: 05/18/2017 CLINICAL DATA:  Status post lymphadenectomy in May with subsequent abscess. Persistent pain and drainage. EXAM: ULTRASOUND left axillaUPPER EXTREMITY LIMITED TECHNIQUE:  Ultrasound examination of the upper extremity soft tissues was performed in the area of clinical concern. COMPARISON:  None FINDINGS: There is a 4.8 x 1.6 x 4.2 cm fluid collection in the left axilla consistent with an abscess. This is draining to an open wound on the skin. Recommend aspiration/drainage. IMPRESSION: 4.8 cm fluid collection in the left axilla consistent with an abscess. Electronically Signed   By: Marijo Sanes M.D.   On: 05/18/2017 08:47    Anti-infectives: Anti-infectives    Start     Dose/Rate Route Frequency Ordered Stop   05/17/17 1000  vancomycin (VANCOCIN) IVPB 750 mg/150 ml premix     750 mg 150 mL/hr over 60 Minutes Intravenous Every 12 hours 05/17/17 0006     05/17/17 1000  valACYclovir (VALTREX) tablet 1,000 mg     1,000 mg Oral 3 times daily  05/17/17 0448     05/17/17 0000  vancomycin (VANCOCIN) IVPB 1000 mg/200 mL premix     1,000 mg 200 mL/hr over 60 Minutes Intravenous NOW 05/16/17 2351 05/17/17 0101       Assessment/Plan Left axillary wound - u/s shows 4.8 x 1.6 x 4.2 cm fluid collection in the left axilla consistent with an abscess - cx growing STAPHYLOCOCCUS AUREUS, pan sensitive.  - plan for I&D in OR later today. NPO. Will narrow abx postop.  Ductal carcinoma stage IIa, triple negative - Chemotherapy per Ennever, MD GERD HTN HLD Hypothyroidism Asthma Anxiety/depression VTE- Lovenox   LOS: 2 days    Jerrye Beavers , [facility] Surgery 05/18/2017, 10:27 AM Pager: [phone] Consults: [phone] Mon-Fri 7:00 am-4:30 pm Sat-Sun 7:00 am-11:30 am

## 2017-05-18 NOTE — Op Note (Signed)
05/18/2017  7:44 PM  PATIENT:  Jessica [name]  76 y.o. female  Patient Care Team: [name], MD as PCP - General (Internal Medicine)  PRE-OPERATIVE DIAGNOSIS:  axillary abcess LEFT  POST-OPERATIVE DIAGNOSIS:  Axillary seroma - LEFT  PROCEDURE:   IRRIGATION AND DRAINAGE OF LEFT AXILLARY SEROMA  SURGEON:  Adin Hector, MD  ASSISTANT: rn   ANESTHESIA:   local and general  EBL:  No intake/output data recorded.  Delay start of Pharmacological VTE agent (>24hrs) due to surgical blood loss or risk of bleeding:  no  DRAINS: none   SPECIMEN:  Source of Specimen:  LEFT AXILLARY SEROMA  DISPOSITION OF SPECIMEN:  MICRO  COUNTS:  YES  PLAN OF CARE: Admit for overnight observation  PATIENT DISPOSITION:  PACU - hemodynamically stable.  INDICATION: Patient is status post breast surgery with chronic left axillary wound.  Worsening pain and swelling.  Ultrasound concern for deeper abscess.  I recommended incision and drainage.  Given its deep location and pain, I offered under at least deep sedation if not general anesthesia  The anatomy and physiology of skin abscesses was discussed. Pathophysiology of SQ abscess, possible progression to fasciitis & sepsis, etc discussed . I stressed good hygiene & wound care. Possible redebridement was discussed as well.   Possibility of recurrence was discussed. Risks, benefits, alternatives were discussed. I noted a good likelihood this will help address the problem. Risks of anesthesia and other risks discussed. Questions answered. The patient is does wish to proceed.   OR FINDINGS:   12cm deep seroma on left chest wall at base of axilla.  Wound measures 6cm by 3cm.  It is 12cm deep   DESCRIPTION:   Informed consent was confirmed. The patient received IV antibiotics. The patient underwent general anesthesia without any difficulty. The patient was positioned supine. SCDs were active during the entire case. The left axillary wound where  presumed deep abscess was prepped and draped in a sterile fashion. A surgical timeout confirmed our plan.   I was able to enter through the prior left axillary wound.  I was able to bluntly enter into a deeper cavity that had serous fluid within it.  Not perfectly clear.  Cultures sent.  I was able to bluntly opened up the wound along the left chest wall and axilla to 12 cm deep.  Copious irrigation.  Stasis was ensured.   The fascia and deep tissues were viable.  There is no evidence of any lymphatic leak nor bleeding.    The wound was packed with 2" Dermacea rolled rolled gauze.  Sterile dressings applied.  Patient is being extubated go to recovery room.   We plan to continue IV antibiotics and begin wound care training tomorrow.    I am about to discuss OR findings with family per the patient's request  Adin Hector, M.D., F.A.C.S. Gastrointestinal and Minimally Invasive Surgery [facility] [address] [place], [place] [address] [phone] Main / Paging

## 2017-05-18 NOTE — Anesthesia Postprocedure Evaluation (Signed)
Anesthesia Post Note  Patient: Jessica [name]  Procedure(s) Performed: Procedure(s) (LRB): IRRIGATION AND DEBRIDEMENT LEFT AXILLARY ABSCESS (Left)     Patient location during evaluation: PACU Anesthesia Type: General Level of consciousness: awake and alert Pain management: pain level controlled Vital Signs Assessment: post-procedure vital signs reviewed and stable Respiratory status: spontaneous breathing, nonlabored ventilation, respiratory function stable and patient connected to nasal cannula oxygen Cardiovascular status: blood pressure returned to baseline and stable Postop Assessment: no signs of nausea or vomiting Anesthetic complications: no    Last Vitals:  Vitals:   05/18/17 2045 05/18/17 2100  BP: 121/63 129/60  Pulse: 79 81  Resp: 16 16  Temp: 36.8 C 36.7 C    Last Pain:  Vitals:   05/18/17 1400  TempSrc: Oral  PainSc:                  Effie Berkshire

## 2017-05-18 NOTE — Progress Notes (Signed)
PROGRESS NOTE                                                                                                                                                                                                             Patient Demographics:    Jessica [name], is a 76 y.o. female, DOB - [birth year], RJJ:[id number]  Admit date - 05/16/2017   Admitting Physician Reubin Milan, MD  Outpatient Primary MD for the patient is [name], MD  LOS - 2  Outpatient Specialists:  Dr Brantley Stage  Dr Marin Olp   Chief Complaint  Patient presents with  . Abscess    cancer patient       Brief Narrative   76 year old female with history of anxiety, depression, asthma, CTD, hyperlipidemia, hypertension, hypothyroidism, stage IIa invasive ductal carcinoma of the left breast (triple negative) status post left breast lumpectomy and sentinel lymph node dissection in May 2018 followed by first cycle of chemotherapy with Taxotere and carboplatin on 04/28/2017 who was seen in the ED on 6/26 with left eye rigidity wound infection with drainage. She was seen by surgery and performed I&D. She was discharged on 7 day course of oral doxycycline. The wound culture grew MSSA which was pansensitive. She completed antibiotics on 7/3 and saw her surgeon in the office about one week back. She was instructed to do wet-to-dry dressing and had home health nurse. During this time she was having off-and-on clear drainage from the wound. For past 2 days she was having increased pain over the left axillary wound site with nausea and chills. On the day of admission she noticed increased malodorous discharge from the wound and came to the ED.  In the ED she was afebrile. Had normal white count but mildly elevated lactic acid of 1.98. Patient started on empiric IV vancomycin and admitted to hospitalist service.    Subjective:   Still having pain over left axilla  with purulent discharge   Assessment  & Plan :    Principal Problem:   Cellulitis and abscess of left axilla Ultrasound done shows 4.8 cm fluid collection in the left axilla abscess. On empiric IV vancomycin. Wound culture on admission again showing moderate staph aureus. Follow sensitivity. Keep nothing by mouth for I&D in OR today. Pain control and when necessary Percocet.  Active Problems: Active Problems: Left corneal  shingles. Was seen by Dr Kathlen Mody (ophthalmologist) and had a course of Valtrex. Since symptoms reoccurred recently she was placed back on Valtrex which is continued.    Essential hypertension Stable. Continue HCTZ and Lotensin.    Breast cancer of upper-inner quadrant of left female breast [facility]) Has had one cycle of chemotherapy. Seen by Dr.Ennever today and plans to hold off on further chemotherapy until her wound heals.Marland Kitchen    GERD (gastroesophageal reflux disease) Continue Protonix    Hypothyroidism Continue Synthroid.    Depression with anxiety Continue Effexor.          Code Status : Full code  Family Communication  : Daughter at bedside  Disposition Plan  : Home once improved  Barriers For Discharge : Active symptoms  Consults  :  [place] [facility] Wound care  Procedures  : Ultrasound left axilla  DVT Prophylaxis  :   - Heparin   Lab Results  Component Value Date   PLT 334 05/18/2017    Antibiotics  :   Anti-infectives    Start     Dose/Rate Route Frequency Ordered Stop   05/17/17 1000  vancomycin (VANCOCIN) IVPB 750 mg/150 ml premix     750 mg 150 mL/hr over 60 Minutes Intravenous Every 12 hours 05/17/17 0006     05/17/17 1000  valACYclovir (VALTREX) tablet 1,000 mg     1,000 mg Oral 3 times daily 05/17/17 0448     05/17/17 0000  vancomycin (VANCOCIN) IVPB 1000 mg/200 mL premix     1,000 mg 200 mL/hr over 60 Minutes Intravenous NOW 05/16/17 2351 05/17/17 0101        Objective:   Vitals:   05/17/17 0614 05/17/17 1329  05/17/17 2056 05/18/17 0423  BP: (!) 146/62 (!) 121/54 136/62 (!) 124/59  Pulse: 82 69 68 62  Resp: 18 16 18 16   Temp:  98.5 F (36.9 C) 98 F (36.7 C) 98.4 F (36.9 C)  TempSrc:  Oral Oral Oral  SpO2: 96% 94% 96% 98%  Weight:      Height:        Wt Readings from Last 3 Encounters:  05/17/17 71 kg (156 lb 8.4 oz)  04/27/17 79.4 kg (175 lb)  04/26/17 78.7 kg (173 lb 6.4 oz)     Intake/Output Summary (Last 24 hours) at 05/18/17 1305 Last data filed at 05/18/17 0900  Gross per 24 hour  Intake              150 ml  Output                0 ml  Net              150 ml     Physical Exam Gen.not in distress HEENT: moist mucosa, supple neck Chest: open wound over left axilla, no discharge noted today. Some surrounding erythema with induration and tenderness. CVS: NS1&S2 GI: soft, NT, ND Musculoskeletal: warm, no edema     Data Review:    CBC  Recent Labs Lab 05/16/17 2159 05/17/17 0807 05/18/17 1006  WBC 6.3 4.0 4.5  HGB 11.0* 9.8* 11.0*  HCT 33.0* 29.6* 33.2*  PLT 388 315 334  MCV 97.3 98.7 96.8  MCH 32.4 32.7 32.1  MCHC 33.3 33.1 33.1  RDW 14.9 15.1 15.0  LYMPHSABS 2.0 1.6 1.3  MONOABS 0.5 0.5 0.3  EOSABS 0.0 0.0 0.0  BASOSABS 0.0 0.0 0.0    Chemistries   Recent Labs Lab 05/16/17 2159 05/17/17 0807 05/18/17 1006  NA 138 139 139  K 3.5 3.8 3.9  CL 98* 107 102  CO2 29 29 30   GLUCOSE 134* 122* 88  BUN 18 12 9   CREATININE 0.75 0.60 0.64  CALCIUM 9.6 8.8* 9.4  AST 27  --   --   ALT 24  --   --   ALKPHOS 74  --   --   BILITOT 0.3  --   --    ------------------------------------------------------------------------------------------------------------------ No results for input(s): CHOL, HDL, LDLCALC, TRIG, CHOLHDL, LDLDIRECT in the last 72 hours.  No results found for: HGBA1C ------------------------------------------------------------------------------------------------------------------ No results for input(s): TSH, T4TOTAL, T3FREE, THYROIDAB  in the last 72 hours.  Invalid input(s): FREET3 ------------------------------------------------------------------------------------------------------------------ No results for input(s): VITAMINB12, FOLATE, FERRITIN, TIBC, IRON, RETICCTPCT in the last 72 hours.  Coagulation profile No results for input(s): INR, PROTIME in the last 168 hours.  No results for input(s): DDIMER in the last 72 hours.  Cardiac Enzymes No results for input(s): CKMB, TROPONINI, MYOGLOBIN in the last 168 hours.  Invalid input(s): CK ------------------------------------------------------------------------------------------------------------------ No results found for: BNP  Inpatient Medications  Scheduled Meds: . benazepril  20 mg Oral Daily  . calcium-vitamin D  1 tablet Oral Daily  . Chlorhexidine Gluconate Cloth  6 each Topical Once   And  . Chlorhexidine Gluconate Cloth  6 each Topical Once  . enoxaparin (LOVENOX) injection  40 mg Subcutaneous Q24H  . hydrochlorothiazide  12.5 mg Oral Daily  . levothyroxine  75 mcg Oral Daily  . pantoprazole  40 mg Oral Daily  . valACYclovir  1,000 mg Oral TID  . venlafaxine XR  225 mg Oral Daily   Continuous Infusions: . vancomycin Stopped (05/18/17 1109)   PRN Meds:.acetaminophen, diphenhydrAMINE, lidocaine-prilocaine, LORazepam, ondansetron, oxyCODONE, prochlorperazine  Micro Results Recent Results (from the past 240 hour(s))  Wound or Superficial Culture     Status: None (Preliminary result)   Collection Time: 05/16/17 11:27 PM  Result Value Ref Range Status   Specimen Description WOUND LEFT AXILLA  Final   Special Requests NONE  Final   Gram Stain   Final    FEW WBC PRESENT,BOTH PMN AND MONONUCLEAR RARE SQUAMOUS EPITHELIAL CELLS PRESENT RARE GRAM POSITIVE COCCI IN PAIRS RARE GRAM POSITIVE RODS    Culture   Final    MODERATE STAPHYLOCOCCUS AUREUS SUSCEPTIBILITIES TO FOLLOW Performed at [facility], [place] [address] [place], [place]  [address]    Report Status PENDING  Incomplete    Radiology Reports [place] Lt Upper Extrem Ltd Soft Tissue Non Vascular  Result Date: 05/18/2017 CLINICAL DATA:  Status post lymphadenectomy in May with subsequent abscess. Persistent pain and drainage. EXAM: ULTRASOUND left axillaUPPER EXTREMITY LIMITED TECHNIQUE: Ultrasound examination of the upper extremity soft tissues was performed in the area of clinical concern. COMPARISON:  None FINDINGS: There is a 4.8 x 1.6 x 4.2 cm fluid collection in the left axilla consistent with an abscess. This is draining to an open wound on the skin. Recommend aspiration/drainage. IMPRESSION: 4.8 cm fluid collection in the left axilla consistent with an abscess. Electronically Signed   By: Marijo Sanes M.D.   On: 05/18/2017 08:47    Time Spent in minutes  25   Louellen Molder M.D on 05/18/2017 at 1:05 PM  Between 7am to 7pm - Pager - [phone]  After 7pm go to [email or web address] - password [facility]  [facility] -  Office  [phone]

## 2017-05-18 NOTE — Interval H&P Note (Signed)
History and Physical Interval Note:  05/18/2017 6:20 PM  Jessica [name]  has presented today for surgery, with the diagnosis of axillary abcess LEFT  The various methods of treatment have been discussed with the patient and family. After consideration of risks, benefits and other options for treatment, the patient has consented to  Procedure(s): IRRIGATION AND DEBRIDEMENT LEFT AXILLARY ABSCESS (Left) as a surgical intervention .  The patient's history has been reviewed, patient examined, no change in status, stable for surgery.  I have reviewed the patient's chart and labs.  Questions were answered to the patient's satisfaction.     Paulene Tayag C.  I have re-reviewed the the patient's records, history, medications, and allergies.  I have re-examined the patient.  I again discussed intraoperative plans and goals of post-operative recovery.  The patient agrees to proceed.  Jessica [name]  November 25, 1940 [id number]  Patient Care Team: [name], MD as PCP - General (Internal Medicine)  Patient Active Problem List   Diagnosis Date Noted  . Cellulitis of left axilla 05/17/2017  . GERD (gastroesophageal reflux disease) 05/16/2017  . Hypothyroidism 05/16/2017  . Depression with anxiety 05/16/2017  . Breast cancer of upper-inner quadrant of left female breast ([place]) 03/11/2017  . Goals of care, counseling/discussion 03/11/2017  . Obesity (BMI 30-39.9) 08/31/2013  . Essential hypertension - well controlled 08/31/2013    Class: Diagnosis of  . Depression 08/31/2013    Class: Diagnosis of  . Vasovagal near-syncope -rare 08/31/2013    Class: History of  . Dyslipidemia, goal LDL below 160 08/31/2013    Past Medical History:  Diagnosis Date  . Allergy   . Anemia   . Anxiety   . Asthma    in past, no inhalers now  . Blood transfusion without reported diagnosis   . Breast cancer ([place]) 01/2017   left breast   . Breast cancer of upper-inner quadrant of left female breast ([place])  03/11/2017  . Cancer ([place]) 01/2017   left breast  . Cataract    bilateral removed  . Chronic kidney disease    kidney stones  . Depression 08/31/2013  . Dyslipidemia, goal LDL below 160 08/31/2013  . Essential hypertension - well controlled 08/31/2013  . GERD (gastroesophageal reflux disease)   . Goals of care, counseling/discussion 03/11/2017  . Hypothyroidism   . Obesity (BMI 30-39.9) 08/31/2013  . Thyroid disease   . Vasovagal near-syncope -rare 08/31/2013    Past Surgical History:  Procedure Laterality Date  . ABDOMINAL HYSTERECTOMY     total  . BREAST BIOPSY Left 01/2017    malignant  . broken fingers    . CATARACT EXTRACTION Bilateral   . COLONOSCOPY    . EXCISIONAL HEMORRHOIDECTOMY    . kidney stones lithotripsy    . PORTACATH PLACEMENT Right 03/23/2017   Procedure: INSERTION PORT-A-CATH;  Surgeon: Erroll Luna, MD;  Location: [facility];  Service: General;  Laterality: Right;  . RADIOACTIVE SEED GUIDED MASTECTOMY WITH AXILLARY SENTINEL LYMPH NODE BIOPSY Left 03/23/2017   Procedure: LEFT BREAST RADIOACTIVE SEED GUIDED PARTIAL MASTECTOMY AND  SENTINEL LYMPH NODE MAPPING;  Surgeon: Erroll Luna, MD;  Location: [facility];  Service: General;  Laterality: Left;  . ROTATOR CUFF REPAIR     left   . TONSILLECTOMY    . WISDOM TOOTH EXTRACTION      Social History   Social History  . Marital status: Married    Spouse name: N/A  . Number of children: 3  . Years  of education: N/A   Occupational History  . retired     Social History Main Topics  . Smoking status: Never Smoker  . Smokeless tobacco: Never Used  . Alcohol use No  . Drug use: No  . Sexual activity: Not on file   Other Topics Concern  . Not on file   Social History Narrative   Married mother of 3 with one grandchild. Never smoked. Does not drink alcohol.   Walks 3-4 days a week for roughly 20-30 minutes.    Family History  Problem Relation Age of Onset  . Heart  disease Mother        Angina  . Heart disease Father   . Cervical cancer Maternal Aunt   . Heart attack Brother        Died of MI in his mid 30s  . Prostate cancer Brother   . Colon cancer Neg Hx   . Pancreatic cancer Neg Hx   . Stomach cancer Neg Hx   . Esophageal cancer Neg Hx   . Rectal cancer Neg Hx     Current Facility-Administered Medications  Medication Dose Route Frequency Provider Last Rate Last Dose  . acetaminophen (TYLENOL) tablet 650 mg  650 mg Oral Q6H PRN Reubin Milan, MD   650 mg at 05/17/17 1937  . benazepril (LOTENSIN) tablet 20 mg  20 mg Oral Daily Reubin Milan, MD   20 mg at 05/18/17 0957  . calcium-vitamin D (OSCAL WITH D) 500-200 MG-UNIT per tablet 1 tablet  1 tablet Oral Daily Reubin Milan, MD   1 tablet at 05/18/17 [phone]  . Chlorhexidine Gluconate Cloth 2 % PADS 6 each  6 each Topical Once Izora Gala A, PA-C      . diphenhydrAMINE (BENADRYL) capsule 25 mg  25 mg Oral Q4H PRN [name], [name], NP      . enoxaparin (LOVENOX) injection 40 mg  40 mg Subcutaneous Q24H Dhungel, Nishant, MD   40 mg at 05/18/17 0958  . hydrochlorothiazide (MICROZIDE) capsule 12.5 mg  12.5 mg Oral Daily Reubin Milan, MD   12.5 mg at 05/18/17 4166  . levothyroxine (SYNTHROID, LEVOTHROID) tablet 75 mcg  75 mcg Oral Daily Reubin Milan, MD   75 mcg at 05/18/17 0734  . lidocaine-prilocaine (EMLA) cream 1 application  1 application Topical PRN Reubin Milan, MD      . LORazepam (ATIVAN) tablet 0.5 mg  0.5 mg Oral Q6H PRN Reubin Milan, MD      . ondansetron [facility]) tablet 8 mg  8 mg Oral BID PRN Reubin Milan, MD      . oxyCODONE (Oxy IR/ROXICODONE) immediate release tablet 5-10 mg  5-10 mg Oral Q6H PRN Reubin Milan, MD   5 mg at 05/17/17 2043  . pantoprazole (PROTONIX) EC tablet 40 mg  40 mg Oral Daily Reubin Milan, MD   40 mg at 05/18/17 0959  . prochlorperazine (COMPAZINE) tablet 10 mg  10 mg Oral Q6H PRN Reubin Milan, MD      . valACYclovir Estell Harpin) tablet 1,000 mg  1,000 mg Oral TID Reubin Milan, MD   1,000 mg at 05/18/17 1656  . vancomycin (VANCOCIN) IVPB 750 mg/150 ml premix  750 mg Intravenous Q12H Dorrene German, [facility]   Stopped at 05/18/17 1109  . venlafaxine XR (EFFEXOR-XR) 24 hr capsule 225 mg  225 mg Oral Daily Reubin Milan, MD   225 mg at 05/18/17 1001  Allergies  Allergen Reactions  . Codeine Hives  . Penicillins Hives    Has patient had a PCN reaction causing immediate rash, facial/tongue/throat swelling, SOB or lightheadedness with hypotension: yes Has patient had a PCN reaction causing severe rash involving mucus membranes or skin necrosis:  no Has patient had a PCN reaction that required hospitalization:  no Has patient had a PCN reaction occurring within the last 10 years: no If all of the above answers are "NO", then may proceed with Cephalosporin use.   . Advil [Ibuprofen] Hives and Itching  . Band-Aid Plus Antibiotic [Bacitracin-Polymyxin B] Dermatitis    BP 119/71 (BP Location: Right Arm)   Pulse 69   Temp 97.9 F (36.6 C) (Oral)   Resp 18   Ht 5\' 2"  (1.575 m)   Wt 71 kg (156 lb 8.4 oz)   SpO2 97%   BMI 28.63 kg/m   Labs: Results for orders placed or performed during the hospital encounter of 05/16/17 (from the past 48 hour(s))  Urinalysis, Routine w reflex microscopic     Status: Abnormal   Collection Time: 05/16/17  9:57 PM  Result Value Ref Range   Color, Urine YELLOW YELLOW   APPearance CLEAR CLEAR   Specific Gravity, Urine 1.017 1.005 - 1.030   pH 5.0 5.0 - 8.0   Glucose, UA NEGATIVE NEGATIVE mg/dL   Hgb urine dipstick NEGATIVE NEGATIVE   Bilirubin Urine NEGATIVE NEGATIVE   Ketones, ur NEGATIVE NEGATIVE mg/dL   Protein, ur NEGATIVE NEGATIVE mg/dL   Nitrite NEGATIVE NEGATIVE   Leukocytes, UA TRACE (A) NEGATIVE   RBC / HPF 0-5 0 - 5 RBC/hpf   WBC, UA 6-30 0 - 5 WBC/hpf   Bacteria, UA FEW (A) NONE SEEN   Squamous Epithelial / LPF  0-5 (A) NONE SEEN   Mucous PRESENT    Hyaline Casts, UA PRESENT   Comprehensive metabolic panel     Status: Abnormal   Collection Time: 05/16/17  9:59 PM  Result Value Ref Range   Sodium 138 135 - 145 mmol/L   Potassium 3.5 3.5 - 5.1 mmol/L   Chloride 98 (L) 101 - 111 mmol/L   CO2 29 22 - 32 mmol/L   Glucose, Bld 134 (H) 65 - 99 mg/dL   BUN 18 6 - 20 mg/dL   Creatinine, Ser 05/18/17 0.44 - 1.00 mg/dL   Calcium 9.6 8.9 - 4.03 mg/dL   Total Protein 7.0 6.5 - 8.1 g/dL   Albumin 3.3 (L) 3.5 - 5.0 g/dL   AST 27 15 - 41 U/L   ALT 24 14 - 54 U/L   Alkaline Phosphatase 74 38 - 126 U/L   Total Bilirubin 0.3 0.3 - 1.2 mg/dL   GFR calc non Af Amer >60 >60 mL/min   GFR calc Af Amer >60 >60 mL/min    Comment: (NOTE) The eGFR has been calculated using the CKD EPI equation. This calculation has not been validated in all clinical situations. eGFR's persistently <60 mL/min signify possible Chronic Kidney Disease.    Anion gap 11 5 - 15  CBC with Differential     Status: Abnormal   Collection Time: 05/16/17  9:59 PM  Result Value Ref Range   WBC 6.3 4.0 - 10.5 K/uL   RBC 3.39 (L) 3.87 - 5.11 MIL/uL   Hemoglobin 11.0 (L) 12.0 - 15.0 g/dL   HCT 05/18/17 (L) 67.1 - 51.9 %   MCV 97.3 78.0 - 100.0 fL   MCH 32.4 26.0 - 34.0 pg  MCHC 33.3 30.0 - 36.0 g/dL   RDW 14.9 11.5 - 15.5 %   Platelets 388 150 - 400 K/uL   Neutrophils Relative % 60 %   Neutro Abs 3.8 1.7 - 7.7 K/uL   Lymphocytes Relative 32 %   Lymphs Abs 2.0 0.7 - 4.0 K/uL   Monocytes Relative 8 %   Monocytes Absolute 0.5 0.1 - 1.0 K/uL   Eosinophils Relative 0 %   Eosinophils Absolute 0.0 0.0 - 0.7 K/uL   Basophils Relative 0 %   Basophils Absolute 0.0 0.0 - 0.1 K/uL  I-Stat CG4 Lactic Acid, ED     Status: Abnormal   Collection Time: 05/16/17 10:10 PM  Result Value Ref Range   Lactic Acid, Venous 1.98 (H) 0.5 - 1.9 mmol/L  Wound or Superficial Culture     Status: None (Preliminary result)   Collection Time: 05/16/17 11:27 PM  Result  Value Ref Range   Specimen Description WOUND LEFT AXILLA    Special Requests NONE    Gram Stain      FEW WBC PRESENT,BOTH PMN AND MONONUCLEAR RARE SQUAMOUS EPITHELIAL CELLS PRESENT RARE GRAM POSITIVE COCCI IN PAIRS RARE GRAM POSITIVE RODS    Culture      MODERATE STAPHYLOCOCCUS AUREUS SUSCEPTIBILITIES TO FOLLOW Performed at [facility], [place] [address], [place], [place] [address]    Report Status PENDING   Culture, blood (Routine X 2) w Reflex to ID Panel     Status: None (Preliminary result)   Collection Time: 05/16/17 11:28 PM  Result Value Ref Range   Specimen Description BLOOD RIGHT ANTECUBITAL    Special Requests IN PEDIATRIC BOTTLE Blood Culture adequate volume    Culture      NO GROWTH 1 DAY Performed at [facility], [place] [address], [place], [place] [address]    Report Status PENDING   Culture, blood (Routine X 2) w Reflex to ID Panel     Status: None (Preliminary result)   Collection Time: 05/16/17 11:33 PM  Result Value Ref Range   Specimen Description BLOOD RIGHT HAND    Special Requests      BOTTLES DRAWN AEROBIC AND ANAEROBIC Blood Culture adequate volume   Culture      NO GROWTH 1 DAY Performed at [facility], [place] [address], [place], [place] [address]    Report Status PENDING   Lactic acid, plasma     Status: None   Collection Time: 05/17/17  3:03 AM  Result Value Ref Range   Lactic Acid, Venous 1.1 0.5 - 1.9 mmol/L  Lactic acid, plasma     Status: None   Collection Time: 05/17/17  6:55 AM  Result Value Ref Range   Lactic Acid, Venous 1.2 0.5 - 1.9 mmol/L  CBC WITH DIFFERENTIAL     Status: Abnormal   Collection Time: 05/17/17  8:07 AM  Result Value Ref Range   WBC 4.0 4.0 - 10.5 K/uL   RBC 3.00 (L) 3.87 - 5.11 MIL/uL   Hemoglobin 9.8 (L) 12.0 - 15.0 g/dL   HCT 29.6 (L) 36.0 - 46.0 %   MCV 98.7 78.0 - 100.0 fL   MCH 32.7 26.0 - 34.0 pg   MCHC 33.1 30.0 - 36.0 g/dL   RDW 15.1 11.5 - 15.5 %   Platelets 315 150 - 400 K/uL    Neutrophils Relative % 50 %   Neutro Abs 2.0 1.7 - 7.7 K/uL   Lymphocytes Relative 39 %   Lymphs  Abs 1.6 0.7 - 4.0 K/uL   Monocytes Relative 11 %   Monocytes Absolute 0.5 0.1 - 1.0 K/uL   Eosinophils Relative 0 %   Eosinophils Absolute 0.0 0.0 - 0.7 K/uL   Basophils Relative 0 %   Basophils Absolute 0.0 0.0 - 0.1 K/uL  Basic metabolic panel     Status: Abnormal   Collection Time: 05/17/17  8:07 AM  Result Value Ref Range   Sodium 139 135 - 145 mmol/L   Potassium 3.8 3.5 - 5.1 mmol/L   Chloride 107 101 - 111 mmol/L   CO2 29 22 - 32 mmol/L   Glucose, Bld 122 (H) 65 - 99 mg/dL   BUN 12 6 - 20 mg/dL   Creatinine, Ser 8.94 0.44 - 1.00 mg/dL   Calcium 8.8 (L) 8.9 - 10.3 mg/dL   GFR calc non Af Amer >60 >60 mL/min   GFR calc Af Amer >60 >60 mL/min    Comment: (NOTE) The eGFR has been calculated using the CKD EPI equation. This calculation has not been validated in all clinical situations. eGFR's persistently <60 mL/min signify possible Chronic Kidney Disease.    Anion gap 3 (L) 5 - 15  CBC WITH DIFFERENTIAL     Status: Abnormal   Collection Time: 05/18/17 10:06 AM  Result Value Ref Range   WBC 4.5 4.0 - 10.5 K/uL   RBC 3.43 (L) 3.87 - 5.11 MIL/uL   Hemoglobin 11.0 (L) 12.0 - 15.0 g/dL   HCT 09.0 (L) 39.3 - 02.7 %   MCV 96.8 78.0 - 100.0 fL   MCH 32.1 26.0 - 34.0 pg   MCHC 33.1 30.0 - 36.0 g/dL   RDW 39.2 30.4 - 26.0 %   Platelets 334 150 - 400 K/uL   Neutrophils Relative % 64 %   Neutro Abs 2.9 1.7 - 7.7 K/uL   Lymphocytes Relative 29 %   Lymphs Abs 1.3 0.7 - 4.0 K/uL   Monocytes Relative 7 %   Monocytes Absolute 0.3 0.1 - 1.0 K/uL   Eosinophils Relative 0 %   Eosinophils Absolute 0.0 0.0 - 0.7 K/uL   Basophils Relative 0 %   Basophils Absolute 0.0 0.0 - 0.1 K/uL  Basic metabolic panel     Status: None   Collection Time: 05/18/17 10:06 AM  Result Value Ref Range   Sodium 139 135 - 145 mmol/L   Potassium 3.9 3.5 - 5.1 mmol/L   Chloride 102 101 - 111 mmol/L   CO2 30  22 - 32 mmol/L   Glucose, Bld 88 65 - 99 mg/dL   BUN 9 6 - 20 mg/dL   Creatinine, Ser 8.60 0.44 - 1.00 mg/dL   Calcium 9.4 8.9 - 24.6 mg/dL   GFR calc non Af Amer >60 >60 mL/min   GFR calc Af Amer >60 >60 mL/min    Comment: (NOTE) The eGFR has been calculated using the CKD EPI equation. This calculation has not been validated in all clinical situations. eGFR's persistently <60 mL/min signify possible Chronic Kidney Disease.    Anion gap 7 5 - 15    Imaging / Studies: [place] Lt Upper Extrem Ltd Soft Tissue Non Vascular  Result Date: 05/18/2017 CLINICAL DATA:  Status post lymphadenectomy in May with subsequent abscess. Persistent pain and drainage. EXAM: ULTRASOUND left axillaUPPER EXTREMITY LIMITED TECHNIQUE: Ultrasound examination of the upper extremity soft tissues was performed in the area of clinical concern. COMPARISON:  None FINDINGS: There is a 4.8 x 1.6 x 4.2 cm fluid  collection in the left axilla consistent with an abscess. This is draining to an open wound on the skin. Recommend aspiration/drainage. IMPRESSION: 4.8 cm fluid collection in the left axilla consistent with an abscess. Electronically Signed   By: Marijo Sanes M.D.   On: 05/18/2017 08:47     .Adin Hector, M.D., F.A.C.S. Gastrointestinal and Minimally Invasive Surgery [facility] [address], [place], [place] [address] [phone] Main / Paging  05/18/2017 6:20 PM

## 2017-05-18 NOTE — Progress Notes (Signed)
Patient ID: Jessica [name], female   DOB: 1941/02/20, 76 y.o.   MRN: [id number]  [facility] Progress Note     Subjective: CC- left axillary abscess Daughter at bedside. Axilla u/s yesterday showed a 4.8 x 1.6 x 4.2 cm fluid collection consistent with abscess. Planning to go to OR today for drainage. Patient has no new complaints.   Objective: Vital signs in last 24 hours: Temp:  [98 F (36.7 C)-98.5 F (36.9 C)] 98.4 F (36.9 C) (07/17 0423) Pulse Rate:  [62-69] 62 (07/17 0423) Resp:  [16-18] 16 (07/17 0423) BP: (121-136)/(54-62) 124/59 (07/17 0423) SpO2:  [94 %-98 %] 98 % (07/17 0423) Last BM Date: 05/17/17  Intake/Output from previous day: 07/16 0701 - 07/17 0700 In: 450 [IV Piggyback:150] Out: 800 [Urine:800] Intake/Output this shift: No intake/output data recorded.  PE: Gen:  Alert, NAD, pleasant HEENT: EOM's intact, pupils equal  Card:  RRR, no M/G/R heard Pulm:  CTAB, no W/R/R, effort normal Abd: Soft, NT/ND, +BS, no HSM, no hernia Psych: A&Ox3  Skin: no rashes noted, warm and dry Left axilla: ~2x2x0.5cm with surrounding induration, no active drainage or surrounding erythema  Lab Results:   Recent Labs  05/16/17 2159 05/17/17 0807  WBC 6.3 4.0  HGB 11.0* 9.8*  HCT 33.0* 29.6*  PLT 388 315   BMET  Recent Labs  05/16/17 2159 05/17/17 0807  NA 138 139  K 3.5 3.8  CL 98* 107  CO2 29 29  GLUCOSE 134* 122*  BUN 18 12  CREATININE 0.75 0.60  CALCIUM 9.6 8.8*   PT/INR No results for input(s): LABPROT, INR in the last 72 hours. CMP     Component Value Date/Time   NA 139 05/17/2017 0807   NA 129 04/26/2017 0827   NA 139 04/12/2017 1043   K 3.8 05/17/2017 0807   K 3.1 (L) 04/26/2017 0827   K 3.9 04/12/2017 1043   CL 107 05/17/2017 0807   CL 96 (L) 04/26/2017 0827   CO2 29 05/17/2017 0807   CO2 29 04/26/2017 0827   CO2 28 04/12/2017 1043   GLUCOSE 122 (H) 05/17/2017 0807   GLUCOSE 157 (H) 04/26/2017 0827   BUN 12 05/17/2017  0807   BUN 10 04/26/2017 0827   BUN 12.3 04/12/2017 1043   CREATININE 0.60 05/17/2017 0807   CREATININE 0.7 04/26/2017 0827   CREATININE 0.8 04/12/2017 1043   CALCIUM 8.8 (L) 05/17/2017 0807   CALCIUM 9.2 04/26/2017 0827   CALCIUM 9.8 04/12/2017 1043   PROT 7.0 05/16/2017 2159   PROT 7.1 04/26/2017 0827   PROT 6.9 04/12/2017 1043   ALBUMIN 3.3 (L) 05/16/2017 2159   ALBUMIN 3.1 (L) 04/26/2017 0827   ALBUMIN 3.5 04/12/2017 1043   AST 27 05/16/2017 2159   AST 27 04/26/2017 0827   AST 15 04/12/2017 1043   ALT 24 05/16/2017 2159   ALT 30 04/26/2017 0827   ALT 13 04/12/2017 1043   ALKPHOS 74 05/16/2017 2159   ALKPHOS 58 04/26/2017 0827   ALKPHOS 61 04/12/2017 1043   BILITOT 0.3 05/16/2017 2159   BILITOT 0.60 04/26/2017 0827   BILITOT 0.27 04/12/2017 1043   GFRNONAA >60 05/17/2017 0807   GFRAA >60 05/17/2017 0807   Lipase  No results found for: LIPASE     Studies/Results: [place] Lt Upper Extrem Ltd Soft Tissue Non Vascular  Result Date: 05/18/2017 CLINICAL DATA:  Status post lymphadenectomy in May with subsequent abscess. Persistent pain and drainage. EXAM: ULTRASOUND left axillaUPPER EXTREMITY LIMITED TECHNIQUE:  Ultrasound examination of the upper extremity soft tissues was performed in the area of clinical concern. COMPARISON:  None FINDINGS: There is a 4.8 x 1.6 x 4.2 cm fluid collection in the left axilla consistent with an abscess. This is draining to an open wound on the skin. Recommend aspiration/drainage. IMPRESSION: 4.8 cm fluid collection in the left axilla consistent with an abscess. Electronically Signed   By: Marijo Sanes M.D.   On: 05/18/2017 08:47    Anti-infectives: Anti-infectives    Start     Dose/Rate Route Frequency Ordered Stop   05/17/17 1000  vancomycin (VANCOCIN) IVPB 750 mg/150 ml premix     750 mg 150 mL/hr over 60 Minutes Intravenous Every 12 hours 05/17/17 0006     05/17/17 1000  valACYclovir (VALTREX) tablet 1,000 mg     1,000 mg Oral 3 times daily  05/17/17 0448     05/17/17 0000  vancomycin (VANCOCIN) IVPB 1000 mg/200 mL premix     1,000 mg 200 mL/hr over 60 Minutes Intravenous NOW 05/16/17 2351 05/17/17 0101       Assessment/Plan Left axillary wound - u/s shows 4.8 x 1.6 x 4.2 cm fluid collection in the left axilla consistent with an abscess - cx growing STAPHYLOCOCCUS AUREUS, pan sensitive.  - plan for I&D in OR later today. NPO. Will narrow abx postop.  Ductal carcinoma stage IIa, triple negative - Chemotherapy per En[place], MD GERD HTN HLD Hypothyroidism Asthma Anxiety/depression VTE- Lovenox   LOS: 2 days    Jerrye Beavers , [facility] Surgery 05/18/2017, 10:27 AM Pager: [phone] Consults: [phone] Mon-Fri 7:00 am-4:30 pm Sat-Sun 7:00 am-11:30 am

## 2017-05-18 NOTE — Anesthesia Procedure Notes (Signed)
Date/Time: 05/18/2017 8:00 PM Performed by: Cynda Familia Oxygen Delivery Method: Simple face mask Airway Equipment and Method: Oral airway Comments: Extubated good AW --- simple mask to PACU with OA and O2

## 2017-05-18 NOTE — Consult Note (Signed)
Referral MD  Reason for Referral: Abscess in the left axilla-positive for Staphylococcus 3 weeks ago; stage IIA triple negative ductal carcinoma of the left breast   Chief Complaint  Patient presents with  . Abscess    cancer patient  : I have developed another infection in my left axilla.   HPI: Jessica [name] is well-known to me. She is a 76 year old white female. She is post menopausal. She has a stage IIA triple negative ductal carcinoma of the left breast. She is node negative. She had chemotherapy with her first cycle of Taxotere/carboplatin. This was given on June 25.  The next day, she developed discharge from the left axilla. She went to the emergency room. She was found to have an abscess. This was opened. This started to grow Staphylococcus. She was given oral antibiotics.  She also developed a herpes zoster infection of the right eye. She has been on Valtrex for this.  She was admitted on July 15 with erythema and tenderness in the left axilla. She has been started on IV vancomycin. Cultures have been taken.  She is not neutropenic. Her white count is 4.0. Hemoglobin 9.8. Platelet count 315,000 her electrolytes look okay.  She is due for second cycle of treatment tomorrow.  She's had no obvious fever. She's had no cough. There's been no rashes. She's had no diarrhea. She's had no nausea or vomiting.     Past Medical History:  Diagnosis Date  . Allergy   . Anemia   . Anxiety   . Asthma    in past, no inhalers now  . Blood transfusion without reported diagnosis   . Breast cancer ([place] [place] [facility]) 01/2017   left breast   . Breast cancer of upper-inner quadrant of left female breast ([place] [place]) 03/11/2017  . Cancer ([facility]) 01/2017   left breast  . Cataract    bilateral removed  . Chronic kidney disease    kidney stones  . Depression 08/31/2013  . Dyslipidemia, goal LDL below 160 08/31/2013  . Essential hypertension - well controlled 08/31/2013  . GERD (gastroesophageal reflux disease)    . Goals of care, counseling/discussion 03/11/2017  . Hypothyroidism   . Obesity (BMI 30-39.9) 08/31/2013  . Thyroid disease   . Vasovagal near-syncope -rare 08/31/2013  :  Past Surgical History:  Procedure Laterality Date  . ABDOMINAL HYSTERECTOMY     total  . BREAST BIOPSY Left 01/2017    malignant  . broken fingers    . CATARACT EXTRACTION Bilateral   . COLONOSCOPY    . EXCISIONAL HEMORRHOIDECTOMY    . kidney stones lithotripsy    . PORTACATH PLACEMENT Right 03/23/2017   Procedure: INSERTION PORT-A-CATH;  Surgeon: Erroll Luna, MD;  Location: [facility];  Service: General;  Laterality: Right;  . RADIOACTIVE SEED GUIDED MASTECTOMY WITH AXILLARY SENTINEL LYMPH NODE BIOPSY Left 03/23/2017   Procedure: LEFT BREAST RADIOACTIVE SEED GUIDED PARTIAL MASTECTOMY AND  SENTINEL LYMPH NODE MAPPING;  Surgeon: Erroll Luna, MD;  Location: [facility];  Service: General;  Laterality: Left;  . ROTATOR CUFF REPAIR     left   . TONSILLECTOMY    . WISDOM TOOTH EXTRACTION    :   Current Facility-Administered Medications:  .  acetaminophen (TYLENOL) tablet 650 mg, 650 mg, Oral, Q6H PRN, Reubin Milan, MD, 650 mg at 05/17/17 1937 .  benazepril (LOTENSIN) tablet 20 mg, 20 mg, Oral, Daily, Reubin Milan, MD, 20 mg at 05/17/17 0955 .  calcium-vitamin D (OSCAL WITH D)  500-200 MG-UNIT per tablet 1 tablet, 1 tablet, Oral, Daily, Reubin Milan, MD, 1 tablet at 05/17/17 [phone] .  diphenhydrAMINE (BENADRYL) capsule 25 mg, 25 mg, Oral, Q4H PRN, [name], [name], NP .  enoxaparin (LOVENOX) injection 40 mg, 40 mg, Subcutaneous, Q24H, Dhungel, Nishant, MD, 40 mg at 05/17/17 0955 .  hydrochlorothiazide (MICROZIDE) capsule 12.5 mg, 12.5 mg, Oral, Daily, Reubin Milan, MD, 12.5 mg at 05/17/17 0955 .  levothyroxine (SYNTHROID, LEVOTHROID) tablet 75 mcg, 75 mcg, Oral, Daily, Reubin Milan, MD, 75 mcg at 05/17/17 0955 .  lidocaine-prilocaine (EMLA) cream  1 application, 1 application, Topical, PRN, Reubin Milan, MD .  LORazepam (ATIVAN) tablet 0.5 mg, 0.5 mg, Oral, Q6H PRN, Reubin Milan, MD .  ondansetron [facility]) tablet 8 mg, 8 mg, Oral, BID PRN, Reubin Milan, MD .  oxyCODONE (Oxy IR/ROXICODONE) immediate release tablet 5-10 mg, 5-10 mg, Oral, Q6H PRN, Reubin Milan, MD, 5 mg at 05/17/17 2043 .  pantoprazole (PROTONIX) EC tablet 40 mg, 40 mg, Oral, Daily, Reubin Milan, MD, 40 mg at 05/17/17 0955 .  prochlorperazine (COMPAZINE) tablet 10 mg, 10 mg, Oral, Q6H PRN, Reubin Milan, MD .  valACYclovir Estell Harpin) tablet 1,000 mg, 1,000 mg, Oral, TID, Reubin Milan, MD, 1,000 mg at 05/17/17 2230 .  vancomycin (VANCOCIN) IVPB 750 mg/150 ml premix, 750 mg, Intravenous, Q12H, Dorrene German, RPH, Stopped at 05/18/17 0117 .  venlafaxine XR (EFFEXOR-XR) 24 hr capsule 225 mg, 225 mg, Oral, Daily, Reubin Milan, MD, 225 mg at 05/17/17 0955:  . benazepril  20 mg Oral Daily  . calcium-vitamin D  1 tablet Oral Daily  . enoxaparin (LOVENOX) injection  40 mg Subcutaneous Q24H  . hydrochlorothiazide  12.5 mg Oral Daily  . levothyroxine  75 mcg Oral Daily  . pantoprazole  40 mg Oral Daily  . valACYclovir  1,000 mg Oral TID  . venlafaxine XR  225 mg Oral Daily  :  Allergies  Allergen Reactions  . Codeine Hives  . Penicillins Hives    Has patient had a PCN reaction causing immediate rash, facial/tongue/throat swelling, SOB or lightheadedness with hypotension: yes Has patient had a PCN reaction causing severe rash involving mucus membranes or skin necrosis:  no Has patient had a PCN reaction that required hospitalization:  no Has patient had a PCN reaction occurring within the last 10 years: no If all of the above answers are "NO", then may proceed with Cephalosporin use.   . Advil [Ibuprofen] Hives and Itching  . Band-Aid Plus Antibiotic [Bacitracin-Polymyxin B] Dermatitis  :  Family History  Problem  Relation Age of Onset  . Heart disease Mother        Angina  . Heart disease Father   . Cervical cancer Maternal Aunt   . Heart attack Brother        Died of MI in his mid 26s  . Prostate cancer Brother   . Colon cancer Neg Hx   . Pancreatic cancer Neg Hx   . Stomach cancer Neg Hx   . Esophageal cancer Neg Hx   . Rectal cancer Neg Hx   :  Social History   Social History  . Marital status: Married    Spouse name: N/A  . Number of children: 3  . Years of education: N/A   Occupational History  . retired     Social History Main Topics  . Smoking status: Never Smoker  . Smokeless tobacco: Never Used  .  Alcohol use No  . Drug use: No  . Sexual activity: Not on file   Other Topics Concern  . Not on file   Social History Narrative   Married mother of 3 with one grandchild. Never smoked. Does not drink alcohol.   Walks 3-4 days a week for roughly 20-30 minutes.  :  Pertinent items are noted in HPI.  Exam: Patient Vitals for the past 24 hrs:  BP Temp Temp src Pulse Resp SpO2  05/18/17 0423 (!) 124/59 98.4 F (36.9 C) Oral 62 16 98 %  05/17/17 2056 136/62 98 F (36.7 C) Oral 68 18 96 %  05/17/17 1329 (!) 121/54 98.5 F (36.9 C) Oral 69 16 94 %    As above    Recent Labs  05/16/17 2159 05/17/17 0807  WBC 6.3 4.0  HGB 11.0* 9.8*  HCT 33.0* 29.6*  PLT 388 315    Recent Labs  05/16/17 2159 05/17/17 0807  NA 138 139  K 3.5 3.8  CL 98* 107  CO2 29 29  GLUCOSE 134* 122*  BUN 18 12  CREATININE 0.75 0.60  CALCIUM 9.6 8.8*    Blood smear review:  None   Pathology: None     Assessment and Plan: Jessica [name] is a 76 year old white female. She has stage IIa ductal carcinoma of the left breast. She had lumpectomy. She is triple negative. She requires adjuvant chemotherapy. She received Taxotere/carboplatin.  I really cannot believe that this infection is related to the chemotherapy. This all happened when she was not even neutropenic. This seemed to  happen the day after treatment. At that point, her blood count had not even gone down.  Of note, we have been giving her Neulasta after chemotherapy.  Clearly, we will have to put the chemotherapy on hold for right now. She really needs to heal up this wound.  We will see what is growing out. I would have believe that this is staph.  Not much that we have to do for right now.  I appreciate all the great help that she is getting from everybody up on [address].  Lattie Haw, MD  Psalm 37:40

## 2017-05-19 ENCOUNTER — Other Ambulatory Visit: Payer: Medicare Other

## 2017-05-19 ENCOUNTER — Ambulatory Visit: Payer: Medicare Other

## 2017-05-19 ENCOUNTER — Ambulatory Visit: Payer: Medicare Other | Admitting: Hematology & Oncology

## 2017-05-19 ENCOUNTER — Encounter (HOSPITAL_COMMUNITY): Payer: Self-pay | Admitting: Surgery

## 2017-05-19 DIAGNOSIS — I10 Essential (primary) hypertension: Secondary | ICD-10-CM

## 2017-05-19 DIAGNOSIS — T783XXA Angioneurotic edema, initial encounter: Secondary | ICD-10-CM

## 2017-05-19 DIAGNOSIS — C50212 Malignant neoplasm of upper-inner quadrant of left female breast: Secondary | ICD-10-CM

## 2017-05-19 DIAGNOSIS — N6489 Other specified disorders of breast: Secondary | ICD-10-CM

## 2017-05-19 LAB — BASIC METABOLIC PANEL
Anion gap: 9 (ref 5–15)
BUN: 8 mg/dL (ref 6–20)
CO2: 28 mmol/L (ref 22–32)
Calcium: 9.1 mg/dL (ref 8.9–10.3)
Chloride: 103 mmol/L (ref 101–111)
Creatinine, Ser: 0.7 mg/dL (ref 0.44–1.00)
GFR calc Af Amer: >60 mL/min (ref >60)
GFR calc non Af Amer: >60 mL/min (ref >60)
Glucose, Bld: 125 mg/dL — ABNORMAL HIGH (ref 65–99)
Potassium: 4 mmol/L (ref 3.5–5.1)
Sodium: 140 mmol/L (ref 135–145)

## 2017-05-19 LAB — CBC WITH DIFFERENTIAL/PLATELET
Basophils Absolute: 0 10*3/uL (ref 0.0–0.1)
Basophils Relative: 0 %
Eosinophils Absolute: 0 10*3/uL (ref 0.0–0.7)
Eosinophils Relative: 0 %
HCT: 31.5 % — ABNORMAL LOW (ref 36.0–46.0)
Hemoglobin: 10.4 g/dL — ABNORMAL LOW (ref 12.0–15.0)
Lymphocytes Relative: 9 %
Lymphs Abs: 0.7 10*3/uL (ref 0.7–4.0)
MCH: 32.1 pg (ref 26.0–34.0)
MCHC: 33 g/dL (ref 30.0–36.0)
MCV: 97.2 fL (ref 78.0–100.0)
Monocytes Absolute: 0.2 10*3/uL (ref 0.1–1.0)
Monocytes Relative: 3 %
Neutro Abs: 7 10*3/uL (ref 1.7–7.7)
Neutrophils Relative %: 88 %
Platelets: 276 10*3/uL (ref 150–400)
RBC: 3.24 MIL/uL — ABNORMAL LOW (ref 3.87–5.11)
RDW: 15.1 % (ref 11.5–15.5)
WBC: 7.9 10*3/uL (ref 4.0–10.5)

## 2017-05-19 LAB — AEROBIC CULTURE W GRAM STAIN (SUPERFICIAL SPECIMEN)

## 2017-05-19 LAB — AEROBIC CULTURE  (SUPERFICIAL SPECIMEN)

## 2017-05-19 LAB — TYPE AND SCREEN
ABO/RH(D): A POS
Antibody Screen: NEGATIVE

## 2017-05-19 LAB — ABO/RH: ABO/RH(D): A POS

## 2017-05-19 LAB — MRSA PCR SCREENING: MRSA by PCR: NEGATIVE

## 2017-05-19 MED ORDER — SODIUM CHLORIDE 0.9% FLUSH
10.0000 mL | INTRAVENOUS | Status: DC | PRN
  Administered 2017-05-20 – 2017-05-21 (×2): 10 mL
  Filled 2017-05-19 (×2): qty 40

## 2017-05-19 MED ORDER — FAMOTIDINE IN NACL 20-0.9 MG/50ML-% IV SOLN
20.0000 mg | Freq: Two times a day (BID) | INTRAVENOUS | Status: DC
  Administered 2017-05-19 – 2017-05-21 (×5): 20 mg via INTRAVENOUS
  Filled 2017-05-19 (×6): qty 50

## 2017-05-19 MED ORDER — HYDROCORTISONE NA SUCCINATE PF 100 MG IJ SOLR
100.0000 mg | Freq: Once | INTRAMUSCULAR | Status: AC
  Administered 2017-05-19: 100 mg via INTRAVENOUS
  Filled 2017-05-19: qty 2

## 2017-05-19 MED ORDER — DIPHENHYDRAMINE HCL 12.5 MG/5ML PO ELIX: 25.0000 mg | ORAL_SOLUTION | Freq: Once | ORAL | Status: DC

## 2017-05-19 MED ORDER — DIPHENHYDRAMINE HCL 12.5 MG/5ML PO ELIX
12.5000 mg | ORAL_SOLUTION | Freq: Once | ORAL | Status: AC
  Administered 2017-05-19: 12.5 mg via ORAL
  Filled 2017-05-19: qty 5

## 2017-05-19 MED ORDER — METHYLPREDNISOLONE SODIUM SUCC 125 MG IJ SOLR
80.0000 mg | Freq: Every day | INTRAMUSCULAR | Status: DC
  Administered 2017-05-19 – 2017-05-20 (×2): 80 mg via INTRAVENOUS
  Filled 2017-05-19 (×2): qty 2

## 2017-05-19 MED ORDER — VANCOMYCIN HCL IN DEXTROSE 750-5 MG/150ML-% IV SOLN
750.0000 mg | Freq: Two times a day (BID) | INTRAVENOUS | Status: DC
  Filled 2017-05-19: qty 150

## 2017-05-19 MED ORDER — DIPHENHYDRAMINE HCL 50 MG/ML IJ SOLN: 12.5000 mg | Freq: Once | INTRAMUSCULAR | Status: DC

## 2017-05-19 MED ORDER — LIDOCAINE-PRILOCAINE 2.5-2.5 % EX CREA
TOPICAL_CREAM | Freq: Once | CUTANEOUS | Status: AC
  Administered 2017-05-19: 08:00:00 via TOPICAL
  Filled 2017-05-19: qty 5

## 2017-05-19 MED ORDER — DIPHENHYDRAMINE HCL 50 MG/ML IJ SOLN
INTRAMUSCULAR | Status: AC
  Administered 2017-05-19: 25 mg
  Filled 2017-05-19: qty 1

## 2017-05-19 MED ORDER — SODIUM CHLORIDE 0.9 % IV SOLN
INTRAVENOUS | Status: DC
  Administered 2017-05-19 – 2017-05-20 (×2): via INTRAVENOUS

## 2017-05-19 MED ORDER — C1 ESTERASE INHIBITOR (HUMAN) 500 UNITS IV KIT
1500.0000 [IU] | PACK | Freq: Once | INTRAVENOUS | Status: AC
  Administered 2017-05-19: 1500 [IU] via INTRAVENOUS
  Filled 2017-05-19: qty 1500

## 2017-05-19 MED ORDER — FAMOTIDINE IN NACL 20-0.9 MG/50ML-% IV SOLN
20.0000 mg | Freq: Once | INTRAVENOUS | Status: AC
  Administered 2017-05-19: 20 mg via INTRAVENOUS
  Filled 2017-05-19: qty 50

## 2017-05-19 NOTE — Progress Notes (Signed)
1 Day Post-Op    CC:  2x2x2 chronic wound  Subjective: Very anxious and tender.  Very emphatic about wanting Home health to do dressing changes at home.  I will do dressing change later after Iodoform and pain medicine can be given.  Objective: Vital signs in last 24 hours: Temp:  [97.8 F (36.6 C)-98.5 F (36.9 C)] 97.8 F (36.6 C) (07/18 0800) Pulse Rate:  [69-87] 81 (07/18 0900) Resp:  [10-18] 13 (07/18 0900) BP: (117-150)/(49-71) 117/49 (07/18 0800) SpO2:  [94 %-100 %] 95 % (07/18 0900) Last BM Date: 05/17/17 NPO - nothing listed IV:  1326 350 urine recorded No BM VSS Labs OK [place] 7/17: 4.8 cm fluid collection in the left axilla consistent with an abscess.   Intake/Output from previous day: 07/17 0701 - 07/18 0700 In: 1326.3 [I.V.:1276.3; IV Piggyback:50] Out: 350 [Urine:350] Intake/Output this shift: Total I/O In: 20 [I.V.:20] Out: -   General appearance: alert, cooperative, no distress and anxious Incision/Wound:  Lab Results:   Recent Labs  05/18/17 1006 05/19/17 0506  WBC 4.5 7.9  HGB 11.0* 10.4*  HCT 33.2* 31.5*  PLT 334 276    BMET  Recent Labs  05/18/17 1006 05/19/17 0506  NA 139 140  K 3.9 4.0  CL 102 103  CO2 30 28  GLUCOSE 88 125*  BUN 9 8  CREATININE 0.64 0.70  CALCIUM 9.4 9.1   PT/INR No results for input(s): LABPROT, INR in the last 72 hours.   Recent Labs Lab 05/16/17 2159  AST 27  ALT 24  ALKPHOS 74  BILITOT 0.3  PROT 7.0  ALBUMIN 3.3*     Lipase  No results found for: LIPASE   Medications: . calcium-vitamin D  1 tablet Oral Daily  . celecoxib  100 mg Oral BID  . Chlorhexidine Gluconate Cloth  6 each Topical Once  . diphenhydrAMINE  12.5 mg Oral Once  . enoxaparin (LOVENOX) injection  40 mg Subcutaneous Q24H  . hydrochlorothiazide  12.5 mg Oral Daily  . levothyroxine  75 mcg Oral Daily  . lip balm  1 application Topical BID  . methylPREDNISolone (SOLU-MEDROL) injection  80 mg Intravenous Daily  .  pantoprazole  40 mg Oral Daily  . sodium chloride flush  3 mL Intravenous Q12H  . valACYclovir  1,000 mg Oral TID  . venlafaxine XR  225 mg Oral Daily   . sodium chloride    . sodium chloride 10 mL/hr at 05/19/17 0900  . famotidine (PEPCID) IV    . methocarbamol (ROBAXIN)  IV    . vancomycin      Assessment/Plan Axillary wound S/p IRRIGATION AND DRAINAGE OF LEFT AXILLARY SEROMA, 05/18/17, Dr. Michael Boston S/p left breast lumpectomy 03/23/17 Dr.Cornett  I&D in ED 04/27/16 - pan sensitive to MSSA Triple negative stage IIa ductal carcinoma/Chemotherapy 04/28/17/Radiation therapy/Dr. Burney Gauze Tongue swelling- ? angioedema Left corneal shingles - Dr. Kathlen Mody ophthalmologist - Valtrex GERD Hypothyroid Hypertension Depression/Anxiety FEN:  Soft diet ID: Valacyclovir/ Vancomycin 7/16 - we can stop this today DVT:  Lovenox   Plan:  Case manager, start daily dressing changes, stop Vancomycin for now.   LOS: 3 days    Jessica [name] 05/19/2017 [id number]

## 2017-05-19 NOTE — Progress Notes (Signed)
Jessica [name] is now down in the ICU. Not sure exactly what happened yesterday. She did it in the operating room. She had this wound opened up. It was cleaned out. The cavity was incredibly large.  It sounds like there may have been some issues with postop anesthesia. I will know she has some kind of reaction.  She looks good this morning.  She was due for second cycle of treatment today. However, we are going to have to put a hold on chemotherapy until this cavity heals up.  So far, her cultures are negative. She was growing Staph about 3 weeks ago.  She is not complaining of any obvious pain. She's had no nausea or vomiting. There is no fever.  Her labs look pretty decent. White cell count 7.9. Hemoglobin 10.4. Platelet count 276,000. Her potassium 4.0. Her blood sugar 125.  Again, we are going to have to put a hold on chemotherapy until this cavity/wound heals up. I'm still not sure how she developed this. We will have see what the cultures are from what was withdrawn yesterday.  Hopefully, she'll be moved out of the ICU today.  We had a very good prayer session. As always, I appreciate the wonderful care that she is getting from the staff down in the ICU. The nurses do a tremendous job.  Lattie Haw, MD  Psalm 103:13

## 2017-05-19 NOTE — Progress Notes (Signed)
Patient ID: Jessica [name], female   DOB: 1940-12-06, 76 y.o.   MRN: [id number]  PROGRESS NOTE    PHOEBIE SHAD  EYC:[id number] DOB: Jan 22, 1941 DOA: 05/16/2017 PCP: [name], MD   Brief Narrative:  76 year old female with history of anxiety, depression, asthma, hyperlipidemia, hypertension, hypothyroidism, stage IIa invasive ductal carcinoma of the left breast (triple negative) status post left breast lumpectomy and sentinel lymph node dissection in May 2018 followed by first cycle of chemotherapy with Taxotere and carboplatin on 04/28/2017 who was seen in the ED on 6/26 with left axillary wound infection with drainage. She was seen by surgery and performed I&D. She was discharged on 7 day course of oral doxycycline. The wound culture grew MSSA which was pansensitive. She completed antibiotics on 7/3 and saw her surgeon in the office about one week back. She was instructed to do wet-to-dry dressing and had home health nurse. During this time she was having off-and-on clear drainage from the wound. For past 2 days prior to presentation to ED, she was having increased pain over the left axillary wound site with nausea and chills. On the day of admission she noticed increased malodorous discharge from the wound and came to the ED.  In the ED she was afebrile. Had normal white count but mildly elevated lactic acid of 1.98. Patient started on empiric IV vancomycin and admitted to hospitalist service.  She had I&D of the left axillary seroma on 05/18/2017. Postprocedure, she developed tongue swelling for which she was given intravenous hydrocortisone along with Pepcid and Benadryl and transferred to stepdown unit. Lotensin and vancomycin have been on hold.    Assessment & Plan:   Principal Problem:   Cellulitis of left axilla Active Problems:   Essential hypertension - well controlled   Breast cancer of upper-inner quadrant of left female breast ([facility])   GERD (gastroesophageal reflux  disease)   Hypothyroidism   Depression with anxiety   Left axillary seroma status post incision and drainage 05/18/2017  Tongue swelling with questionable neck swelling probably from angioedema - Questionable cause. Status post treatment with hydrocortisone, Benadryl and Pepcid. - Still has significant tongue swelling. Will give Solu-Medrol 80 mg IV now along with Pepcid and Benadryl. Keep Lotensin on hold. Restart vancomycin. - Monitor for airway obstruction. - Bedside swallow evaluation with water and advance diet as tolerated - Patient might need outpatient evaluation by an allergist     Cellulitis and abscess of left axilla with left axillary seroma Ultrasound done shows 4.8 cm fluid collection in the left axilla abscess. On empiric IV vancomycin, which was held last night for probable angioedema. Restart vancomycin. Wound culture on admission again showing moderate staph aureus. Follow sensitivity. - Status post I&D on 05/18/2017. Follow further recommendations from general surgery and wound care as per general surgery.  Left corneal shingles. Was seen by Dr Kathlen Mody (ophthalmologist) and had a course of Valtrex. Since symptoms reoccurred recently she was placed back on Valtrex which is continued. Continue oral Valtrex if can tolerate oral. If not, we will switch her to IV acyclovir    Essential hypertension Stable. Monitor off Lotensin. Continue hydrochlorothiazide    Breast cancer of upper-inner quadrant of left female breast [facility]) Has had one cycle of chemotherapy. Seen by Dr.Ennever and plans to hold off on further chemotherapy until her wound heals.Marland Kitchen    GERD (gastroesophageal reflux disease) Continue Protonix    Hypothyroidism Continue Synthroid.    Depression with anxiety Continue Effexor.  DVT prophylaxis: Heparin Code  Status:  Full Family Communication: Discussed with daughters present at bedside Disposition Plan: Home once improved and cleared by general surgery  next 2-3 days  Consultants: Gen. surgery and oncology and wound care   Procedures: I&D of the left axillary wound on 05/18/2017  Antimicrobials: Vancomycin from 05/16/2017 and Valtrex from 05/17/2017  Subjective: Patient seen and examined at bedside. She still complains of tongue swelling but feels that it is getting better. The daughters are concerned that her neck is swollen as well. No difficulty breathing, chest pain, fever or vomiting. Patient stated that she had no trouble swallowing water but the daughters were concerned that she had trouble doing so.  Objective: Vitals:   05/19/17 0600 05/19/17 0700 05/19/17 0715 05/19/17 0800  BP: 139/60   (!) 117/49  Pulse: 82 85 85 82  Resp: 13 12 16 16   Temp:      TempSrc:      SpO2: 94% 96% 97% 99%  Weight:      Height:        Intake/Output Summary (Last 24 hours) at 05/19/17 0854 Last data filed at 05/19/17 0800  Gross per 24 hour  Intake          1336.33 ml  Output              350 ml  Net           986.33 ml   Filed Weights   05/16/17 2347 05/17/17 0038  Weight: 76.2 kg (168 lb) 71 kg (156 lb 8.4 oz)    Examination:  General exam: Appears calm and comfortable  ENT: Tongue looks swollen. No obvious discernible neck Swelling Respiratory system: Bilateral decreased breath sound at bases With some scattered crackles Cardiovascular system: S1 & S2 heard, rate controlled  Gastrointestinal system: Abdomen is nondistended, soft and nontender. Normal bowel sounds heard. Central nervous system: Alert and oriented. No focal neurological deficits. Moving extremities Extremities: No cyanosis, clubbing, edema  Psychiatry: Judgement and insight appear normal. Mood & affect appropriate.     Data Reviewed: I have personally reviewed following labs and imaging studies  CBC:  Recent Labs Lab 05/16/17 2159 05/17/17 0807 05/18/17 1006 05/19/17 0506  WBC 6.3 4.0 4.5 7.9  NEUTROABS 3.8 2.0 2.9 7.0  HGB 11.0* 9.8* 11.0* 10.4*    HCT 33.0* 29.6* 33.2* 31.5*  MCV 97.3 98.7 96.8 97.2  PLT 388 315 334 030   Basic Metabolic Panel:  Recent Labs Lab 05/16/17 2159 05/17/17 0807 05/18/17 1006 05/19/17 0506  NA 138 139 139 140  K 3.5 3.8 3.9 4.0  CL 98* 107 102 103  CO2 29 29 30 28   GLUCOSE 134* 122* 88 125*  BUN 18 12 9 8   CREATININE 0.75 0.60 0.64 0.70  CALCIUM 9.6 8.8* 9.4 9.1   GFR: Estimated Creatinine Clearance: 56.1 mL/min (by C-G formula based on SCr of 0.7 mg/dL). Liver Function Tests:  Recent Labs Lab 05/16/17 2159  AST 27  ALT 24  ALKPHOS 74  BILITOT 0.3  PROT 7.0  ALBUMIN 3.3*   No results for input(s): LIPASE, AMYLASE in the last 168 hours. No results for input(s): AMMONIA in the last 168 hours. Coagulation Profile: No results for input(s): INR, PROTIME in the last 168 hours. Cardiac Enzymes: No results for input(s): CKTOTAL, CKMB, CKMBINDEX, TROPONINI in the last 168 hours. BNP (last 3 results) No results for input(s): PROBNP in the last 8760 hours. HbA1C: No results for input(s): HGBA1C in the last 72 hours. CBG: No  results for input(s): GLUCAP in the last 168 hours. Lipid Profile: No results for input(s): CHOL, HDL, LDLCALC, TRIG, CHOLHDL, LDLDIRECT in the last 72 hours. Thyroid Function Tests: No results for input(s): TSH, T4TOTAL, FREET4, T3FREE, THYROIDAB in the last 72 hours. Anemia Panel: No results for input(s): VITAMINB12, FOLATE, FERRITIN, TIBC, IRON, RETICCTPCT in the last 72 hours. Sepsis Labs:  Recent Labs Lab 05/16/17 2210 05/17/17 0303 05/17/17 0655  LATICACIDVEN 1.98* 1.1 1.2    Recent Results (from the past 240 hour(s))  Wound or Superficial Culture     Status: None (Preliminary result)   Collection Time: 05/16/17 11:27 PM  Result Value Ref Range Status   Specimen Description WOUND LEFT AXILLA  Final   Special Requests NONE  Final   Gram Stain   Final    FEW WBC PRESENT,BOTH PMN AND MONONUCLEAR RARE SQUAMOUS EPITHELIAL CELLS PRESENT RARE GRAM  POSITIVE COCCI IN PAIRS RARE GRAM POSITIVE RODS    Culture   Final    MODERATE STAPHYLOCOCCUS AUREUS SUSCEPTIBILITIES TO FOLLOW Performed at [facility], [place] [address], [place], [place] [address]    Report Status PENDING  Incomplete  Culture, blood (Routine X 2) w Reflex to ID Panel     Status: None (Preliminary result)   Collection Time: 05/16/17 11:28 PM  Result Value Ref Range Status   Specimen Description BLOOD RIGHT ANTECUBITAL  Final   Special Requests IN PEDIATRIC BOTTLE Blood Culture adequate volume  Final   Culture   Final    NO GROWTH 1 DAY Performed at [facility], [place] [address] [place], [place] [address]    Report Status PENDING  Incomplete  Culture, blood (Routine X 2) w Reflex to ID Panel     Status: None (Preliminary result)   Collection Time: 05/16/17 11:33 PM  Result Value Ref Range Status   Specimen Description BLOOD RIGHT HAND  Final   Special Requests   Final    BOTTLES DRAWN AEROBIC AND ANAEROBIC Blood Culture adequate volume   Culture   Final    NO GROWTH 1 DAY Performed at [facility], [place] [address], [place], [place] [address]    Report Status PENDING  Incomplete  Aerobic/Anaerobic Culture (surgical/deep wound)     Status: None (Preliminary result)   Collection Time: 05/18/17  7:38 PM  Result Value Ref Range Status   Specimen Description ABSCESS LEFT AXILLARY  Final   Special Requests PATIENT ON FOLLOWING VANCOMYCIN  Final   Gram Stain   Final    RARE WBC PRESENT, PREDOMINANTLY MONONUCLEAR NO ORGANISMS SEEN Performed at [facility], [place] [address] [place], [place] [address]    Culture PENDING  Incomplete   Report Status PENDING  Incomplete  MRSA PCR Screening     Status: None   Collection Time: 05/19/17  3:36 AM  Result Value Ref Range Status   MRSA by PCR NEGATIVE NEGATIVE Final    Comment:        The GeneXpert MRSA Assay (FDA approved for NASAL specimens only), is one component of a comprehensive MRSA  colonization surveillance program. It is not intended to diagnose MRSA infection nor to guide or monitor treatment for MRSA infections.          Radiology Studies: [place] Soft Tissue Non Vascular  Result Date: 05/18/2017 CLINICAL DATA:  Status post lymphadenectomy in May with subsequent abscess. Persistent pain and drainage. EXAM: ULTRASOUND left axillaUPPER EXTREMITY LIMITED TECHNIQUE: Ultrasound examination  of the upper extremity soft tissues was performed in the area of clinical concern. COMPARISON:  None FINDINGS: There is a 4.8 x 1.6 x 4.2 cm fluid collection in the left axilla consistent with an abscess. This is draining to an open wound on the skin. Recommend aspiration/drainage. IMPRESSION: 4.8 cm fluid collection in the left axilla consistent with an abscess. Electronically Signed   By: Marijo Sanes M.D.   On: 05/18/2017 08:47        Scheduled Meds: . calcium-vitamin D  1 tablet Oral Daily  . celecoxib  100 mg Oral BID  . Chlorhexidine Gluconate Cloth  6 each Topical Once  . diphenhydrAMINE  12.5 mg Oral Once  . enoxaparin (LOVENOX) injection  40 mg Subcutaneous Q24H  . hydrochlorothiazide  12.5 mg Oral Daily  . levothyroxine  75 mcg Oral Daily  . lip balm  1 application Topical BID  . methylPREDNISolone (SOLU-MEDROL) injection  80 mg Intravenous Daily  . pantoprazole  40 mg Oral Daily  . sodium chloride flush  3 mL Intravenous Q12H  . valACYclovir  1,000 mg Oral TID  . venlafaxine XR  225 mg Oral Daily   Continuous Infusions: . sodium chloride    . sodium chloride 10 mL/hr at 05/19/17 0800  . famotidine (PEPCID) IV    . methocarbamol (ROBAXIN)  IV       LOS: 3 days        Aline August, MD [facility] Pager [phone]  If 7PM-7AM, please contact night-coverage [email or web address] Password [facility] 05/19/2017, 8:54 AM

## 2017-05-19 NOTE — Progress Notes (Signed)
Pt called out to nurse complaining of mouth feeling weird, This nurse assess mouth and noted swelling. Pt states that she is not having trouble breathing, and feels like she can swallow. No discoloration to mouth face or extremities noted. On call physician notified for further interventions.

## 2017-05-19 NOTE — Progress Notes (Signed)
Pt transferred down to SDU 1235 in bed with daught er at bedside  Pt vital sign within normal limits, breathing even and unlabored. p Pt complains of no pain at the current time.

## 2017-05-19 NOTE — Significant Event (Signed)
Patient had developed sudden onset of tongue swelling. On exam at bedside patient's tongue is swollen more on the right side. Patient also has some difficulty swallowing. No obvious wheezing. I have ordered 1 dose of hydrocortisone 100 mg IV along with Benadryl 25 mg and Pepcid 20 mg. I have discontinued patient's Lotensin and vancomycin for now. Patient's angioedema could also be from anesthesia. Not sure at this time. If swelling does not improve will give 1 dose of Berinert in view of possible angioedema from ACE inhibitor. Will transfer patient to stepdown for close monitoring. Keep patient nothing by mouth. Patient's daughter updated.  Gean Birchwood

## 2017-05-19 NOTE — Care Management Note (Signed)
Case Management Note  Patient Details  Name: Jessica [name] MRN: [id number] Date of Birth: Jun 23, 1941  Subjective/Objective:      Patient transferred to sdu after procedure on left axilla due to resp. Distress and swelling of the tongue post anesthesia .             Action/Plan: Date:  May 19, 2017 Chart reviewed for concurrent status and case management needs. Will continue to follow patient progress. Discharge Planning: following for needs Expected discharge date: [id number] Velva Harman, BSN, [place], [place]  Expected Discharge Date:                  Expected Discharge Plan:  Home/Self Care  In-House Referral:     Discharge planning Services  CM Consult  Post Acute Care Choice:    Choice offered to:     DME Arranged:    DME Agency:     HH Arranged:    HH Agency:     Status of Service:  In process, will continue to follow  If discussed at Long Length of Stay Meetings, dates discussed:    Additional Comments:  Leeroy Cha, RN 05/19/2017, 8:35 AM

## 2017-05-19 NOTE — Progress Notes (Signed)
Pt Returned to unit from PACU. This nurse and daughter at bedside. Pt complains of drowsiness with no pain at this time. Pt on 2L O2, NAD noted at this time.

## 2017-05-20 LAB — CBC WITH DIFFERENTIAL/PLATELET
Basophils Absolute: 0 10*3/uL (ref 0.0–0.1)
Basophils Relative: 0 %
Eosinophils Absolute: 0 10*3/uL (ref 0.0–0.7)
Eosinophils Relative: 0 %
HCT: 29.2 % — ABNORMAL LOW (ref 36.0–46.0)
Hemoglobin: 9.7 g/dL — ABNORMAL LOW (ref 12.0–15.0)
Lymphocytes Relative: 24 %
Lymphs Abs: 1.3 10*3/uL (ref 0.7–4.0)
MCH: 32.9 pg (ref 26.0–34.0)
MCHC: 33.2 g/dL (ref 30.0–36.0)
MCV: 99 fL (ref 78.0–100.0)
Monocytes Absolute: 0.6 10*3/uL (ref 0.1–1.0)
Monocytes Relative: 13 %
Neutro Abs: 3.2 10*3/uL (ref 1.7–7.7)
Neutrophils Relative %: 63 %
Platelets: 266 10*3/uL (ref 150–400)
RBC: 2.95 MIL/uL — ABNORMAL LOW (ref 3.87–5.11)
RDW: 15.4 % (ref 11.5–15.5)
WBC: 5.1 10*3/uL (ref 4.0–10.5)

## 2017-05-20 LAB — COMPREHENSIVE METABOLIC PANEL
ALT: 17 U/L (ref 14–54)
AST: 18 U/L (ref 15–41)
Albumin: 3 g/dL — ABNORMAL LOW (ref 3.5–5.0)
Alkaline Phosphatase: 57 U/L (ref 38–126)
Anion gap: 6 (ref 5–15)
BUN: 8 mg/dL (ref 6–20)
CO2: 32 mmol/L (ref 22–32)
Calcium: 8.9 mg/dL (ref 8.9–10.3)
Chloride: 102 mmol/L (ref 101–111)
Creatinine, Ser: 0.61 mg/dL (ref 0.44–1.00)
GFR calc Af Amer: >60 mL/min (ref >60)
GFR calc non Af Amer: >60 mL/min (ref >60)
Glucose, Bld: 101 mg/dL — ABNORMAL HIGH (ref 65–99)
Potassium: 3.3 mmol/L — ABNORMAL LOW (ref 3.5–5.1)
Sodium: 140 mmol/L (ref 135–145)
Total Bilirubin: 0.5 mg/dL (ref 0.3–1.2)
Total Protein: 6.5 g/dL (ref 6.5–8.1)

## 2017-05-20 LAB — MAGNESIUM: Magnesium: 1.9 mg/dL (ref 1.7–2.4)

## 2017-05-20 MED ORDER — PREDNISONE 50 MG PO TABS
60.0000 mg | ORAL_TABLET | Freq: Every day | ORAL | Status: DC
  Administered 2017-05-21: 60 mg via ORAL
  Filled 2017-05-20: qty 1

## 2017-05-20 MED ORDER — MAGIC MOUTHWASH
15.0000 mL | Freq: Four times a day (QID) | ORAL | Status: DC
  Administered 2017-05-20 – 2017-05-21 (×5): 15 mL via ORAL
  Filled 2017-05-20 (×7): qty 15

## 2017-05-20 MED ORDER — POLYVINYL ALCOHOL 1.4 % OP SOLN
1.0000 [drp] | OPHTHALMIC | Status: DC | PRN
  Administered 2017-05-20: 1 [drp] via OPHTHALMIC
  Filled 2017-05-20: qty 15

## 2017-05-20 MED ORDER — POTASSIUM CHLORIDE CRYS ER 20 MEQ PO TBCR
40.0000 meq | EXTENDED_RELEASE_TABLET | Freq: Once | ORAL | Status: AC
  Administered 2017-05-20: 40 meq via ORAL
  Filled 2017-05-20: qty 2

## 2017-05-20 NOTE — Progress Notes (Signed)
Patient ID: Jessica [name], female   DOB: 31-Jan-1941, 76 y.o.   MRN: [id number]  PROGRESS NOTE    Jessica [name]  FUX:[id number] DOB: 09/06/1941 DOA: 05/16/2017 PCP: [name], MD   Brief Narrative: 76 year old female with history of anxiety, depression, asthma, hyperlipidemia, hypertension, hypothyroidism, stage IIa invasive ductal carcinoma of the left breast (triple negative) status post left breast lumpectomy and sentinel lymph node dissection in May 2018 followed by first cycle of chemotherapy with Taxotere and carboplatin on 04/28/2017 who was seen in the ED on 6/26 with left axillary wound infection with drainage. She was seen by surgery and performed I&D. She was discharged on 7 day course of oral doxycycline. The wound culture grew MSSA which was pansensitive. She completed antibiotics on 7/3 and saw her surgeon in the office about one week back. She was instructed to do wet-to-dry dressing and had home health nurse. During this time she was having off-and-on clear drainage from the wound. For past 2 days prior to presentation to ED, she was having increased pain over the left axillary wound site with nausea and chills. On the day of admission she noticed increased malodorous discharge from the wound and came to the ED.  In the ED she was afebrile. Had normal white count but mildly elevated lactic acid of 1.98. Patient started on empiric IV vancomycin and admitted to hospitalist service.  She had I&D of the left axillary seroma on 05/18/2017. Postprocedure, she developed tongue swelling for which she was given intravenous hydrocortisone along with Pepcid and Benadryl and transferred to stepdown unit. Lotensin and vancomycin were held. Vancomycin was restarted. Her tongue swelling is improving.   Assessment & Plan:   Principal Problem:   Cellulitis of left axilla Active Problems:   Essential hypertension - well controlled   Breast cancer of upper-inner quadrant of left female  breast (HCC)   GERD (gastroesophageal reflux disease)   Hypothyroidism   Depression with anxiety   Left axillary seroma status post incision and drainage 05/18/2017  Tongue swelling with questionable neck swelling probably from angioedema - Questionable cause. Status post treatment with hydrocortisone, Benadryl and Pepcid. - Tongue swelling is improving. She is tolerating diet. Currently on Solu-Medrol IV 80 mg daily. Switch to oral prednisone 60 mg daily from tomorrow and taper in 5-7 days. Continue with Pepcid and as needed Benadryl. - Still has significant tongue swelling. Will give Solu-Medrol 80 mg IV now along with Pepcid and Benadryl. Keep Lotensin on hold. - Patient maintaining airway. - Transfer out of stepdown unit - Patient might need outpatient evaluation by an allergist   Cellulitis and abscess of left axilla with left axillary seroma - Was on vancomycin IV; vancomycin was discontinued on 05/19/2017 by general surgery. No need for further antibiotics as per general surgery - Wound culture on admission again grew MSSA.  -  Follow further recommendations from general surgery and wound care as per general surgery.  Left corneal shingles. Was seen by Dr Kathlen Mody (ophthalmologist) and had a course of Valtrex. Since symptoms reoccurred recently she was placed back on Valtrex which is continued. Continue oral Valtrex. Outpatient follow-up with ophthalmologist  Essential hypertension Stable. Monitor off Lotensin. Continue hydrochlorothiazide  Breast cancer of upper-inner quadrant of left female breast [facility]) Has had one cycle of chemotherapy. Seen by Dr.Ennever and plans to hold off on further chemotherapy until her wound heals.Marland Kitchen  GERD (gastroesophageal reflux disease) Continue Protonix  Hypothyroidism Continue Synthroid.  Depression with anxiety Continue Effexor.  DVT prophylaxis:  Heparin Code Status:  Full Family Communication: Discussed with daughter  present at bedside Disposition Plan: Home once improved and cleared by general surgery in the next 1-3 days  Consultants: Gen. surgery and oncology and wound care   Procedures: I&D of the left axillary wound on 05/18/2017  Antimicrobials: Vancomycin from 05/16/2017-05/19/2017 and Valtrex from 05/17/2017 onwards   Subjective: Patient seen and examined at bedside. She feels her stomach swelling is improving. She is tolerating food. No difficulty breathing. She complains of some sore throat. No overnight fever, nausea or vomiting.  Objective: Vitals:   05/20/17 0800 05/20/17 0900 05/20/17 1000 05/20/17 1100  BP: (!) 132/55  (!) 136/46   Pulse: 72 84 65 74  Resp: 16 16 20 20   Temp: 98.3 F (36.8 C)     TempSrc: Axillary     SpO2: 94% 94% 95% 95%  Weight:      Height:        Intake/Output Summary (Last 24 hours) at 05/20/17 1246 Last data filed at 05/20/17 0600  Gross per 24 hour  Intake              170 ml  Output              975 ml  Net             -805 ml   Filed Weights   05/16/17 2347 05/17/17 0038  Weight: 76.2 kg (168 lb) 71 kg (156 lb 8.4 oz)    Examination:  General exam: Appears calm and comfortable  ENT: Tongue swelling is improving with small ulcer at the base of the tongue Respiratory system: Bilateral decreased breath sound at bases With some scattered crackles Cardiovascular system: S1 & S2 heard, rate controlled  Gastrointestinal system: Abdomen is nondistended, soft and nontender. Normal bowel sounds heard. Extremities: No cyanosis, clubbing, edema   Data Reviewed: I have personally reviewed following labs and imaging studies  CBC:  Recent Labs Lab 05/16/17 2159 05/17/17 0807 05/18/17 1006 05/19/17 0506 05/20/17 0600  WBC 6.3 4.0 4.5 7.9 5.1  NEUTROABS 3.8 2.0 2.9 7.0 3.2  HGB 11.0* 9.8* 11.0* 10.4* 9.7*  HCT 33.0* 29.6* 33.2* 31.5* 29.2*  MCV 97.3 98.7 96.8 97.2 99.0  PLT 388 315 334 276 696   Basic Metabolic Panel:  Recent  Labs Lab 05/16/17 2159 05/17/17 0807 05/18/17 1006 05/19/17 0506 05/20/17 0600  NA 138 139 139 140 140  K 3.5 3.8 3.9 4.0 3.3*  CL 98* 107 102 103 102  CO2 29 29 30 28  32  GLUCOSE 134* 122* 88 125* 101*  BUN 18 12 9 8 8   CREATININE 0.75 0.60 0.64 0.70 0.61  CALCIUM 9.6 8.8* 9.4 9.1 8.9  MG  --   --   --   --  1.9   GFR: Estimated Creatinine Clearance: 56.1 mL/min (by C-G formula based on SCr of 0.61 mg/dL). Liver Function Tests:  Recent Labs Lab 05/16/17 2159 05/20/17 0600  AST 27 18  ALT 24 17  ALKPHOS 74 57  BILITOT 0.3 0.5  PROT 7.0 6.5  ALBUMIN 3.3* 3.0*   No results for input(s): LIPASE, AMYLASE in the last 168 hours. No results for input(s): AMMONIA in the last 168 hours. Coagulation Profile: No results for input(s): INR, PROTIME in the last 168 hours. Cardiac Enzymes: No results for input(s): CKTOTAL, CKMB, CKMBINDEX, TROPONINI in the last 168 hours. BNP (last 3 results) No results for input(s): PROBNP in the last 8760 hours. HbA1C: No results  for input(s): HGBA1C in the last 72 hours. CBG: No results for input(s): GLUCAP in the last 168 hours. Lipid Profile: No results for input(s): CHOL, HDL, LDLCALC, TRIG, CHOLHDL, LDLDIRECT in the last 72 hours. Thyroid Function Tests: No results for input(s): TSH, T4TOTAL, FREET4, T3FREE, THYROIDAB in the last 72 hours. Anemia Panel: No results for input(s): VITAMINB12, FOLATE, FERRITIN, TIBC, IRON, RETICCTPCT in the last 72 hours. Sepsis Labs:  Recent Labs Lab 05/16/17 2210 05/17/17 0303 05/17/17 0655  LATICACIDVEN 1.98* 1.1 1.2    Recent Results (from the past 240 hour(s))  Wound or Superficial Culture     Status: None   Collection Time: 05/16/17 11:27 PM  Result Value Ref Range Status   Specimen Description WOUND LEFT AXILLA  Final   Special Requests NONE  Final   Gram Stain   Final    FEW WBC PRESENT,BOTH PMN AND MONONUCLEAR RARE SQUAMOUS EPITHELIAL CELLS PRESENT RARE GRAM POSITIVE COCCI IN  PAIRS RARE GRAM POSITIVE RODS Performed at [facility], [address] [place], [place] [address]    Culture MODERATE STAPHYLOCOCCUS AUREUS  Final   Report Status 05/19/2017 FINAL  Final   Organism ID, Bacteria STAPHYLOCOCCUS AUREUS  Final      Susceptibility   Staphylococcus aureus - MIC*    CIPROFLOXACIN <=0.5 SENSITIVE Sensitive     ERYTHROMYCIN <=0.25 SENSITIVE Sensitive     GENTAMICIN <=0.5 SENSITIVE Sensitive     OXACILLIN 0.5 SENSITIVE Sensitive     TETRACYCLINE <=1 SENSITIVE Sensitive     VANCOMYCIN <=0.5 SENSITIVE Sensitive     TRIMETH/SULFA <=10 SENSITIVE Sensitive     CLINDAMYCIN <=0.25 SENSITIVE Sensitive     RIFAMPIN <=0.5 SENSITIVE Sensitive     Inducible Clindamycin NEGATIVE Sensitive     * MODERATE STAPHYLOCOCCUS AUREUS  Culture, blood (Routine X 2) w Reflex to ID Panel     Status: None (Preliminary result)   Collection Time: 05/16/17 11:28 PM  Result Value Ref Range Status   Specimen Description BLOOD RIGHT ANTECUBITAL  Final   Special Requests IN PEDIATRIC BOTTLE Blood Culture adequate volume  Final   Culture   Final    NO GROWTH 3 DAYS Performed at [facility], [place] [address], [place], [place] [address]    Report Status PENDING  Incomplete  Culture, blood (Routine X 2) w Reflex to ID Panel     Status: None (Preliminary result)   Collection Time: 05/16/17 11:33 PM  Result Value Ref Range Status   Specimen Description BLOOD RIGHT HAND  Final   Special Requests   Final    BOTTLES DRAWN AEROBIC AND ANAEROBIC Blood Culture adequate volume   Culture   Final    NO GROWTH 3 DAYS Performed at [facility], [place] [address] [place], [place] [address]    Report Status PENDING  Incomplete  Aerobic/Anaerobic Culture (surgical/deep wound)     Status: None (Preliminary result)   Collection Time: 05/18/17  7:38 PM  Result Value Ref Range Status   Specimen Description ABSCESS LEFT AXILLARY  Final   Special Requests PATIENT ON FOLLOWING VANCOMYCIN   Final   Gram Stain   Final    RARE WBC PRESENT, PREDOMINANTLY MONONUCLEAR NO ORGANISMS SEEN Performed at [facility], [place] [address], [place], [place] [address]    Culture   Final    RARE NORMAL SKIN FLORA NO ANAEROBES ISOLATED; CULTURE IN PROGRESS FOR 5 DAYS    Report Status PENDING  Incomplete  MRSA  PCR Screening     Status: None   Collection Time: 05/19/17  3:36 AM  Result Value Ref Range Status   MRSA by PCR NEGATIVE NEGATIVE Final    Comment:        The GeneXpert MRSA Assay (FDA approved for NASAL specimens only), is one component of a comprehensive MRSA colonization surveillance program. It is not intended to diagnose MRSA infection nor to guide or monitor treatment for MRSA infections.          Radiology Studies: No results found.      Scheduled Meds: . calcium-vitamin D  1 tablet Oral Daily  . celecoxib  100 mg Oral BID  . Chlorhexidine Gluconate Cloth  6 each Topical Once  . enoxaparin (LOVENOX) injection  40 mg Subcutaneous Q24H  . hydrochlorothiazide  12.5 mg Oral Daily  . levothyroxine  75 mcg Oral Daily  . lip balm  1 application Topical BID  . magic mouthwash  15 mL Oral QID  . pantoprazole  40 mg Oral Daily  . potassium chloride  40 mEq Oral Once  . [START ON 05/21/2017] predniSONE  60 mg Oral Q breakfast  . sodium chloride flush  3 mL Intravenous Q12H  . valACYclovir  1,000 mg Oral TID  . venlafaxine XR  225 mg Oral Daily   Continuous Infusions: . sodium chloride    . sodium chloride 10 mL/hr at 05/20/17 0600  . famotidine (PEPCID) IV Stopped (05/20/17 1052)  . methocarbamol (ROBAXIN)  IV       LOS: 4 days        Aline August, MD [facility] Pager [phone]  If 7PM-7AM, please contact night-coverage [email or web address] Password [facility] 05/20/2017, 12:46 PM

## 2017-05-20 NOTE — Progress Notes (Signed)
2 Days Post-Op    CC: 2x2x2 chronic wound  Subjective: Doing fairly well this AM tolerating dressing change fairly well.  Still very anxious about home care and it is a very hard wound to pack.    Objective: Vital signs in last 24 hours: Temp:  [97.7 F (36.5 C)-98.3 F (36.8 C)] 98.3 F (36.8 C) (07/19 0800) Pulse Rate:  [34-97] 74 (07/19 0600) Resp:  [11-24] 18 (07/19 0600) BP: (122-149)/(31-70) 122/66 (07/19 0600) SpO2:  [91 %-98 %] 98 % (07/19 0600) Last BM Date: 05/17/17 PO not recorded IV:  273 Urine 975 Afebrile, VSS K+ is 3.3, Other labs are OK  Intake/Output from previous day: 07/18 0701 - 07/19 0700 In: 273 [I.V.:223; IV Piggyback:50] Out: 975 [Urine:975] Intake/Output this shift: No intake/output data recorded.  General appearance: alert, cooperative and no distress Resp: clear to auscultation bilaterally Incision/Wound:  Open wound is clean I got the dressing out by soaking well and then repacked.  It hooks up and posterior going to the axilla.    Lab Results:   Recent Labs  05/19/17 0506 05/20/17 0600  WBC 7.9 5.1  HGB 10.4* 9.7*  HCT 31.5* 29.2*  PLT 276 266    BMET  Recent Labs  05/19/17 0506 05/20/17 0600  NA 140 140  K 4.0 3.3*  CL 103 102  CO2 28 32  GLUCOSE 125* 101*  BUN 8 8  CREATININE 0.70 0.61  CALCIUM 9.1 8.9   PT/INR No results for input(s): LABPROT, INR in the last 72 hours.   Recent Labs Lab 05/16/17 2159 05/20/17 0600  AST 27 18  ALT 24 17  ALKPHOS 74 57  BILITOT 0.3 0.5  PROT 7.0 6.5  ALBUMIN 3.3* 3.0*     Lipase  No results found for: LIPASE   Medications: . calcium-vitamin D  1 tablet Oral Daily  . celecoxib  100 mg Oral BID  . Chlorhexidine Gluconate Cloth  6 each Topical Once  . enoxaparin (LOVENOX) injection  40 mg Subcutaneous Q24H  . hydrochlorothiazide  12.5 mg Oral Daily  . levothyroxine  75 mcg Oral Daily  . lip balm  1 application Topical BID  . magic mouthwash  15 mL Oral QID  .  methylPREDNISolone (SOLU-MEDROL) injection  80 mg Intravenous Daily  . pantoprazole  40 mg Oral Daily  . sodium chloride flush  3 mL Intravenous Q12H  . valACYclovir  1,000 mg Oral TID  . venlafaxine XR  225 mg Oral Daily   . sodium chloride    . sodium chloride 10 mL/hr at 05/20/17 0600  . famotidine (PEPCID) IV Stopped (05/20/17 1052)  . methocarbamol (ROBAXIN)  IV     Anti-infectives    Start     Dose/Rate Route Frequency Ordered Stop   05/19/17 1000  vancomycin (VANCOCIN) IVPB 750 mg/150 ml premix  Status:  Discontinued     750 mg 150 mL/hr over 60 Minutes Intravenous Every 12 hours 05/19/17 0857 05/19/17 0951   05/17/17 1000  vancomycin (VANCOCIN) IVPB 750 mg/150 ml premix  Status:  Discontinued     750 mg 150 mL/hr over 60 Minutes Intravenous Every 12 hours 05/17/17 0006 05/19/17 0239   05/17/17 1000  valACYclovir (VALTREX) tablet 1,000 mg     1,000 mg Oral 3 times daily 05/17/17 0448     05/17/17 0000  vancomycin (VANCOCIN) IVPB 1000 mg/200 mL premix     1,000 mg 200 mL/hr over 60 Minutes Intravenous NOW 05/16/17 2351 05/17/17 0101  Specimen Description ABSCESS LEFT AXILLARY   05/18/17  Special Requests PATIENT ON FOLLOWING VANCOMYCIN   Gram Stain RARE WBC PRESENT, PREDOMINANTLY MONONUCLEAR  NO ORGANISMS SEEN  Performed at [facility], [place] [address], [place], [place] [address]      Culture RARE NORMAL SKIN FLORA  NO ANAEROBES ISOLATED; CULTURE IN PROGRESS FOR 5 DAYS       Assessment/Plan Axillary wound S/p IRRIGATION AND DRAINAGE OF LEFT AXILLARY SEROMA, 05/18/17, Dr. Michael Boston S/p left breast lumpectomy 03/23/17 Dr.Cornett  I&D in ED 04/27/16 - pan sensitive to MSSA Triple negative stage IIa ductal carcinoma/Chemotherapy 04/28/17/Radiation therapy/Dr. Burney Gauze Tongue swelling- ? angioedema Left corneal shingles - Dr. Kathlen Mody ophthalmologist - Valtrex GERD Hypothyroid Hypertension Depression/Anxiety FEN:  Soft diet ID: Valacyclovir 05/17/17 day 3   Vancomycin 7/16 - 7/18 - discontinued DVT:  Lovenox   Plan:  I have ordered home health, family is concerned they cannot do an adequate job of packing this and it is a very complex wound to pack.  I have ask case management to see and help with home care arrangements, We stopped antibiotics yesterday but culture  is not finalized.  Daughters are also caring for a father who is on dialysis so it is understandable that they area overwhelmed with their parents care.       LOS: 4 days    Jessica [name] 05/20/2017 [phone]

## 2017-05-20 NOTE — Progress Notes (Signed)
Date: May 20, 2017 Chart reviewed for discharge orders: Daughter wants to use [facility] for wound and dressing changes/tct-Brad/will follow patient. Vernia Buff, [phone]

## 2017-05-21 LAB — CBC WITH DIFFERENTIAL/PLATELET
Basophils Absolute: 0 10*3/uL (ref 0.0–0.1)
Basophils Relative: 0 %
Eosinophils Absolute: 0 10*3/uL (ref 0.0–0.7)
Eosinophils Relative: 0 %
HCT: 29.7 % — ABNORMAL LOW (ref 36.0–46.0)
Hemoglobin: 9.7 g/dL — ABNORMAL LOW (ref 12.0–15.0)
Lymphocytes Relative: 32 %
Lymphs Abs: 1.5 10*3/uL (ref 0.7–4.0)
MCH: 32.6 pg (ref 26.0–34.0)
MCHC: 32.7 g/dL (ref 30.0–36.0)
MCV: 99.7 fL (ref 78.0–100.0)
Monocytes Absolute: 0.5 10*3/uL (ref 0.1–1.0)
Monocytes Relative: 11 %
Neutro Abs: 2.7 10*3/uL (ref 1.7–7.7)
Neutrophils Relative %: 57 %
Platelets: 251 10*3/uL (ref 150–400)
RBC: 2.98 MIL/uL — ABNORMAL LOW (ref 3.87–5.11)
RDW: 15.7 % — ABNORMAL HIGH (ref 11.5–15.5)
WBC: 4.7 10*3/uL (ref 4.0–10.5)

## 2017-05-21 LAB — BASIC METABOLIC PANEL
Anion gap: 6 (ref 5–15)
BUN: 14 mg/dL (ref 6–20)
CO2: 31 mmol/L (ref 22–32)
Calcium: 8.8 mg/dL — ABNORMAL LOW (ref 8.9–10.3)
Chloride: 103 mmol/L (ref 101–111)
Creatinine, Ser: 0.67 mg/dL (ref 0.44–1.00)
GFR calc Af Amer: >60 mL/min (ref >60)
GFR calc non Af Amer: >60 mL/min (ref >60)
Glucose, Bld: 98 mg/dL (ref 65–99)
Potassium: 3.7 mmol/L (ref 3.5–5.1)
Sodium: 140 mmol/L (ref 135–145)

## 2017-05-21 LAB — MAGNESIUM: Magnesium: 1.8 mg/dL (ref 1.7–2.4)

## 2017-05-21 MED ORDER — OXYCODONE HCL 5 MG PO TABS: ORAL_TABLET | ORAL | 0 refills | Status: DC

## 2017-05-21 MED ORDER — HEPARIN SOD (PORK) LOCK FLUSH 100 UNIT/ML IV SOLN
500.0000 [IU] | INTRAVENOUS | Status: AC | PRN
  Administered 2017-05-21: 500 [IU]

## 2017-05-21 MED ORDER — PREDNISONE 10 MG PO TABS: ORAL_TABLET | ORAL | 0 refills | Status: DC

## 2017-05-21 MED ORDER — FAMOTIDINE 20 MG PO TABS: 20.0000 mg | ORAL_TABLET | Freq: Two times a day (BID) | ORAL | 0 refills | Status: DC

## 2017-05-21 NOTE — Progress Notes (Signed)
Patient d/ced home with her daughter

## 2017-05-21 NOTE — Discharge Summary (Signed)
Physician Discharge Summary  Jessica [name]:[id number] DOB: 16-Mar-1941 DOA: 05/16/2017  PCP: Jessica Pretty, MD  Admit date: 05/16/2017 Discharge date: 05/21/2017  Admitted From: Home Disposition:  Home  Recommendations for Outpatient Follow-up:  1. Follow up with PCP in 1week 2. Follow-up with general surgery as scheduled. Wound care as per general surgery recommendations 3. Follow up with oncologist as scheduled 4. Follow-up with ophthalmologist as scheduled 5. Advised to schedule an appointment with an allergist as an outpatient at earliest Jessica [name]: Yes Equipment/Devices: None  Discharge Condition: Stable CODE STATUS: Full  Diet recommendation: Heart Healthy  Brief/Interim Summary: 76 year old female with history of anxiety, depression, asthma, hyperlipidemia, hypertension, hypothyroidism, stage IIa invasive ductal carcinoma of the left breast (triple negative) status post left breast lumpectomy and sentinel lymph node dissection in May 2018 followed by first cycle of chemotherapy with Taxotere and carboplatin on 04/28/2017 who was seen in the ED on 6/26 with left axillary wound infection with drainage. She was seen by surgery and performed I&D. She was discharged on 7 day course of oral doxycycline. The wound culture grew MSSA which was pansensitive. She completed antibiotics on 7/3 and saw her surgeon in the office about one week back. She was instructed to do wet-to-dry dressing and had home health nurse. During this time she was having off-and-on clear drainage from the wound. For past 2 days prior to presentation to ED, she was having increased pain over the left axillary wound site with nausea and chills. On the day of admission she noticed increased malodorous discharge from the wound and came to the ED.  In the ED she was afebrile. Had normal white count but mildly elevated lactic acid of 1.98. Patient started on empiric IV vancomycin and admitted to  hospitalist service.  She had I&D of the left axillary seroma on 05/18/2017. Postprocedure, she developed tongue swelling for which she was given intravenous hydrocortisone along with Pepcid and Benadryl and transferred to stepdown unit. Lotensin and vancomycin were held. Vancomycin was restarted. Her tongue swelling is improving.  Antibiotics have been started by general surgery. Patient has been cleared for discharge by general surgery.  Discharge Diagnoses:  Principal Problem:   Cellulitis of left axilla Active Problems:   Essential hypertension - well controlled   Breast cancer of upper-inner quadrant of left female breast (HCC)   GERD (gastroesophageal reflux disease)   Hypothyroidism   Depression with anxiety   Left axillary seroma status post incision and drainage 05/18/2017  Tongue swelling with questionable neck swelling probably from angioedema - Questionable cause. Status post treatment with hydrocortisone, Benadryl and Pepcid. - Switched to IV Solu-Medrol which was switched to oral prednisone on 05/20/2017 - Tongue swelling is improving. She is tolerating diet.  -Discharged home on tapering doses of prednisone over the next 5-7  Days along with oral Pepcid. Use as needed Benadryl - Keep Lotensin on hold. - Patient maintaining airway. - Outpatient evaluation by an allergist   Cellulitis and abscess of left axilla with left axillary seroma - Was on vancomycin IV; vancomycin was discontinued on 05/19/2017 by general surgery. No need for further antibiotics as per general surgery - Wound culture on admission again grew MSSA.  -   outpatient follow-up with general surgery as scheduled and wound care as per their recommendations  Left corneal shingles. Was seen by Dr Kathlen Mody (ophthalmologist) and had a course of Valtrex. Since symptoms reoccurred recently she was placed back on Valtrex which is continued. Continue oral  Valtrex. Outpatient follow-up with  ophthalmologist  Essential hypertension Stable. Monitor off Lotensin. Continue hydrochlorothiazide  Breast cancer of upper-inner quadrant of left female breast [facility]) Has had one cycle of chemotherapy. Seen by Dr.Ennever and plans to hold off on further chemotherapy until her wound heals. - Follow with Dr. Marin Olp as an outpatient  GERD (gastroesophageal reflux disease) Continue Protonix  Hypothyroidism Continue Synthroid.  Depression with anxiety Continue Effexor.  Discharge Instructions    Follow-up Information    Cornett, Jessica Moores, MD. Call in 2 week(s).   Specialty:  General Surgery Why:  We are working on your appointment, please call to confirm. Please arrive 15 minutes prior to your appointment to check in. Contact information: [address] [place] [address] [phone]        Jessica Pretty, MD Follow up in 1 week(s).   Specialty:  Internal Medicine Contact information: [address] [place] [address] [phone]        Jessica Napoleon, MD Follow up.   Specialty:  Oncology Why:  keep scheduled appointment Contact information: [address] [place] [address] [phone]          Allergies  Allergen Reactions  . Codeine Hives  . Penicillins Hives    Has patient had a PCN reaction causing immediate rash, facial/tongue/throat swelling, SOB or lightheadedness with hypotension: yes Has patient had a PCN reaction causing severe rash involving mucus membranes or skin necrosis:  no Has patient had a PCN reaction that required hospitalization:  no Has patient had a PCN reaction occurring within the last 10 years: no If all of the above answers are "NO", then may proceed with Cephalosporin use.   . Advil [Ibuprofen] Hives and Itching  . Band-Aid Plus Antibiotic [Bacitracin-Polymyxin B] Dermatitis  . Benazepril Swelling    Possible cause of tongue swelling   Allergies as of  05/21/2017      Reactions   Codeine Hives   Penicillins Hives   Has patient had a PCN reaction causing immediate rash, facial/tongue/throat swelling, SOB or lightheadedness with hypotension: yes Has patient had a PCN reaction causing severe rash involving mucus membranes or skin necrosis:  no Has patient had a PCN reaction that required hospitalization:  no Has patient had a PCN reaction occurring within the last 10 years: no If all of the above answers are "NO", then may proceed with Cephalosporin use.   Advil [ibuprofen] Hives, Itching   Band-aid Plus Antibiotic [bacitracin-polymyxin B] Dermatitis   Benazepril Swelling   Possible cause of tongue swelling      Medication List    STOP taking these medications   benazepril 20 MG tablet Commonly known as:  LOTENSIN   dexamethasone 4 MG tablet Commonly known as:  DECADRON   prednisoLONE acetate 1 % ophthalmic suspension Commonly known as:  PRED FORTE     TAKE these medications   calcium citrate-vitamin D 315-200 MG-UNIT tablet Commonly known as:  CITRACAL+D Take 1 tablet by mouth daily.   celecoxib 100 MG capsule Commonly known as:  CELEBREX Take 1 capsule (100 mg total) by mouth 2 (two) times daily.   diphenhydramine-acetaminophen 25-500 MG Tabs tablet Commonly known as:  TYLENOL PM Take 1 tablet by mouth at bedtime as needed (sleep).   famotidine 20 MG tablet Commonly known as:  PEPCID Take 1 tablet (20 mg total) by mouth 2 (two) times daily.   hydrochlorothiazide 12.5 MG capsule Commonly known as:  MICROZIDE Take  12.5 mg by mouth daily.   levothyroxine 75 MCG tablet Commonly known as:  SYNTHROID, LEVOTHROID Take 75 mcg by mouth daily.   lidocaine-prilocaine cream Commonly known as:  EMLA Apply 1 application topically as needed.   LORazepam 0.5 MG tablet Commonly known as:  ATIVAN Take 1 tablet (0.5 mg total) by mouth every 6 (six) hours as needed (Nausea or vomiting).   Lysine 1000 MG Tabs Take 1,000 mg by  mouth as needed.   multivitamin tablet Take 1 tablet by mouth daily.   omeprazole 20 MG tablet Commonly known as:  PRILOSEC OTC Take 20 mg by mouth daily.   ondansetron 8 MG tablet Commonly known as:  ZOFRAN Take 1 tablet (8 mg total) by mouth 2 (two) times daily as needed for refractory nausea / vomiting. Start on day 3 after chemo.   OVER THE COUNTER MEDICATION Take 15 mLs by mouth 2 (two) times daily as needed (pain).   oxyCODONE 5 MG immediate release tablet Commonly known as:  Oxy IR/ROXICODONE Take 5-10mg  (1-2 tablets) about 10-15 minutes prior to dressing change. What changed:  how much to take  how to take this  when to take this  reasons to take this  additional instructions   predniSONE 10 MG tablet Commonly known as:  DELTASONE 40mg  daily for 1 day, 30mg  daily for 1 day, 20mg  daily for 2 days, 10 mg daily for 2 days then stop   prochlorperazine 10 MG tablet Commonly known as:  COMPAZINE TAKE 1 TABLET(10 MG) BY MOUTH EVERY 6 HOURS AS NEEDED FOR NAUSEA OR VOMITING   valACYclovir 1000 MG tablet Commonly known as:  VALTREX Take 1 g by mouth 3 (three) times daily.   venlafaxine XR 75 MG 24 hr capsule Commonly known as:  EFFEXOR-XR Take 225 mg by mouth daily.       Consultations:  General surgery and oncology and wound care   Procedures/Studies: [place] Soft Tissue Non Vascular  Result Date: 05/18/2017 CLINICAL DATA:  Status post lymphadenectomy in May with subsequent abscess. Persistent pain and drainage. EXAM: ULTRASOUND left axillaUPPER EXTREMITY LIMITED TECHNIQUE: Ultrasound examination of the upper extremity soft tissues was performed in the area of clinical concern. COMPARISON:  None FINDINGS: There is a 4.8 x 1.6 x 4.2 cm fluid collection in the left axilla consistent with an abscess. This is draining to an open wound on the skin. Recommend aspiration/drainage. IMPRESSION: 4.8 cm fluid collection in the left axilla consistent with  an abscess. Electronically Signed   By: Marijo Sanes M.D.   On: 05/18/2017 08:47    I&D of the left axillary wound on 05/18/2017   Subjective: Patient seen and examined at bedside. She just had a dressing change and is in significant pain. She states that her tongue swelling is improved and she is able to tolerate her diet. No overnight fever or vomiting.  Discharge Exam: Vitals:   05/20/17 2141 05/21/17 0605  BP: 132/77 (!) 123/55  Pulse: 74 (!) 59  Resp: 18 18  Temp: 98 F (36.7 C) 97.7 F (36.5 C)   Vitals:   05/20/17 1632 05/20/17 1927 05/20/17 2141 05/21/17 0605  BP: (!) 134/54  132/77 (!) 123/55  Pulse: 73  74 (!) 59  Resp: 14  18 18   Temp: 98.6 F (37 C)  98 F (36.7 C) 97.7 F (36.5 C)  TempSrc: Oral  Oral Oral  SpO2: 93% 92% 95% 95%  Weight:      Height:  General: Pt is alert, awake, Mild distress secondary to pain Cardiovascular: Intermittent bradycardia, S1/S2 + Respiratory: Bilateral decreased breath sounds at bases Abdominal: Soft, NT, ND, bowel sounds + Extremities: no edema, no cyanosis    The results of significant diagnostics from this hospitalization (including imaging, microbiology, ancillary and laboratory) are listed below for reference.     Microbiology: Recent Results (from the past 240 hour(s))  Wound or Superficial Culture     Status: None   Collection Time: 05/16/17 11:27 PM  Result Value Ref Range Status   Specimen Description WOUND LEFT AXILLA  Final   Special Requests NONE  Final   Gram Stain   Final    FEW WBC PRESENT,BOTH PMN AND MONONUCLEAR RARE SQUAMOUS EPITHELIAL CELLS PRESENT RARE GRAM POSITIVE COCCI IN PAIRS RARE GRAM POSITIVE RODS Performed at [facility], [place] [address], [place], [place] [address]    Culture MODERATE STAPHYLOCOCCUS AUREUS  Final   Report Status 05/19/2017 FINAL  Final   Organism ID, Bacteria STAPHYLOCOCCUS AUREUS  Final      Susceptibility   Staphylococcus aureus - MIC*     CIPROFLOXACIN <=0.5 SENSITIVE Sensitive     ERYTHROMYCIN <=0.25 SENSITIVE Sensitive     GENTAMICIN <=0.5 SENSITIVE Sensitive     OXACILLIN 0.5 SENSITIVE Sensitive     TETRACYCLINE <=1 SENSITIVE Sensitive     VANCOMYCIN <=0.5 SENSITIVE Sensitive     TRIMETH/SULFA <=10 SENSITIVE Sensitive     CLINDAMYCIN <=0.25 SENSITIVE Sensitive     RIFAMPIN <=0.5 SENSITIVE Sensitive     Inducible Clindamycin NEGATIVE Sensitive     * MODERATE STAPHYLOCOCCUS AUREUS  Culture, blood (Routine X 2) w Reflex to ID Panel     Status: None (Preliminary result)   Collection Time: 05/16/17 11:28 PM  Result Value Ref Range Status   Specimen Description BLOOD RIGHT ANTECUBITAL  Final   Special Requests IN PEDIATRIC BOTTLE Blood Culture adequate volume  Final   Culture   Final    NO GROWTH 4 DAYS Performed at [facility], [place] [address], [place], [place] [address]    Report Status PENDING  Incomplete  Culture, blood (Routine X 2) w Reflex to ID Panel     Status: None (Preliminary result)   Collection Time: 05/16/17 11:33 PM  Result Value Ref Range Status   Specimen Description BLOOD RIGHT HAND  Final   Special Requests   Final    BOTTLES DRAWN AEROBIC AND ANAEROBIC Blood Culture adequate volume   Culture   Final    NO GROWTH 4 DAYS Performed at [facility], [place] [address] [place], [place] [address]    Report Status PENDING  Incomplete  Aerobic/Anaerobic Culture (surgical/deep wound)     Status: None (Preliminary result)   Collection Time: 05/18/17  7:38 PM  Result Value Ref Range Status   Specimen Description ABSCESS LEFT AXILLARY  Final   Special Requests PATIENT ON FOLLOWING VANCOMYCIN  Final   Gram Stain   Final    RARE WBC PRESENT, PREDOMINANTLY MONONUCLEAR NO ORGANISMS SEEN Performed at [facility], [place] [address] [place], [place] [address]    Culture   Final    RARE NORMAL SKIN FLORA NO ANAEROBES ISOLATED; CULTURE IN PROGRESS FOR 5 DAYS    Report Status PENDING  Incomplete   MRSA PCR Screening     Status: None   Collection Time: 05/19/17  3:36 AM  Result Value Ref Range Status   MRSA by PCR NEGATIVE NEGATIVE  Final    Comment:        The GeneXpert MRSA Assay (FDA approved for NASAL specimens only), is one component of a comprehensive MRSA colonization surveillance program. It is not intended to diagnose MRSA infection nor to guide or monitor treatment for MRSA infections.      Labs: BNP (last 3 results) No results for input(s): BNP in the last 8760 hours. Basic Metabolic Panel:  Recent Labs Lab 05/17/17 0807 05/18/17 1006 05/19/17 0506 05/20/17 0600 05/21/17 0453  NA 139 139 140 140 140  K 3.8 3.9 4.0 3.3* 3.7  CL 107 102 103 102 103  CO2 29 30 28  32 31  GLUCOSE 122* 88 125* 101* 98  BUN 12 9 8 8 14   CREATININE 0.60 0.64 0.70 0.61 0.67  CALCIUM 8.8* 9.4 9.1 8.9 8.8*  MG  --   --   --  1.9 1.8   Liver Function Tests:  Recent Labs Lab 05/16/17 2159 05/20/17 0600  AST 27 18  ALT 24 17  ALKPHOS 74 57  BILITOT 0.3 0.5  PROT 7.0 6.5  ALBUMIN 3.3* 3.0*   No results for input(s): LIPASE, AMYLASE in the last 168 hours. No results for input(s): AMMONIA in the last 168 hours. CBC:  Recent Labs Lab 05/17/17 0807 05/18/17 1006 05/19/17 0506 05/20/17 0600 05/21/17 0453  WBC 4.0 4.5 7.9 5.1 4.7  NEUTROABS 2.0 2.9 7.0 3.2 2.7  HGB 9.8* 11.0* 10.4* 9.7* 9.7*  HCT 29.6* 33.2* 31.5* 29.2* 29.7*  MCV 98.7 96.8 97.2 99.0 99.7  PLT 315 334 276 266 251   Cardiac Enzymes: No results for input(s): CKTOTAL, CKMB, CKMBINDEX, TROPONINI in the last 168 hours. BNP: Invalid input(s): POCBNP CBG: No results for input(s): GLUCAP in the last 168 hours. D-Dimer No results for input(s): DDIMER in the last 72 hours. Hgb A1c No results for input(s): HGBA1C in the last 72 hours. Lipid Profile No results for input(s): CHOL, HDL, LDLCALC, TRIG, CHOLHDL, LDLDIRECT in the last 72 hours. Thyroid function studies No results for input(s): TSH,  T4TOTAL, T3FREE, THYROIDAB in the last 72 hours.  Invalid input(s): FREET3 Anemia work up No results for input(s): VITAMINB12, FOLATE, FERRITIN, TIBC, IRON, RETICCTPCT in the last 72 hours. Urinalysis    Component Value Date/Time   COLORURINE YELLOW 05/16/2017 2157   APPEARANCEUR CLEAR 05/16/2017 2157   LABSPEC 1.017 05/16/2017 2157   PHURINE 5.0 05/16/2017 2157   GLUCOSEU NEGATIVE 05/16/2017 2157   HGBUR NEGATIVE 05/16/2017 2157   BILIRUBINUR NEGATIVE 05/16/2017 2157   [place] NEGATIVE 05/16/2017 2157   PROTEINUR NEGATIVE 05/16/2017 2157   NITRITE NEGATIVE 05/16/2017 2157   LEUKOCYTESUR TRACE (A) 05/16/2017 2157   Sepsis Labs Invalid input(s): PROCALCITONIN,  WBC,  LACTICIDVEN Microbiology Recent Results (from the past 240 hour(s))  Wound or Superficial Culture     Status: None   Collection Time: 05/16/17 11:27 PM  Result Value Ref Range Status   Specimen Description WOUND LEFT AXILLA  Final   Special Requests NONE  Final   Gram Stain   Final    FEW WBC PRESENT,BOTH PMN AND MONONUCLEAR RARE SQUAMOUS EPITHELIAL CELLS PRESENT RARE GRAM POSITIVE COCCI IN PAIRS RARE GRAM POSITIVE RODS Performed at [facility], [place] [address], [place],  [address]    Culture MODERATE STAPHYLOCOCCUS AUREUS  Final   Report Status 05/19/2017 FINAL  Final   Organism ID, Bacteria STAPHYLOCOCCUS AUREUS  Final      Susceptibility   Staphylococcus aureus - MIC*  CIPROFLOXACIN <=0.5 SENSITIVE Sensitive     ERYTHROMYCIN <=0.25 SENSITIVE Sensitive     GENTAMICIN <=0.5 SENSITIVE Sensitive     OXACILLIN 0.5 SENSITIVE Sensitive     TETRACYCLINE <=1 SENSITIVE Sensitive     VANCOMYCIN <=0.5 SENSITIVE Sensitive     TRIMETH/SULFA <=10 SENSITIVE Sensitive     CLINDAMYCIN <=0.25 SENSITIVE Sensitive     RIFAMPIN <=0.5 SENSITIVE Sensitive     Inducible Clindamycin NEGATIVE Sensitive     * MODERATE STAPHYLOCOCCUS AUREUS  Culture, blood (Routine X 2) w Reflex to ID Panel     Status: None  (Preliminary result)   Collection Time: 05/16/17 11:28 PM  Result Value Ref Range Status   Specimen Description BLOOD RIGHT ANTECUBITAL  Final   Special Requests IN PEDIATRIC BOTTLE Blood Culture adequate volume  Final   Culture   Final    NO GROWTH 4 DAYS Performed at [facility], [place] [address] [place], [place] [address]    Report Status PENDING  Incomplete  Culture, blood (Routine X 2) w Reflex to ID Panel     Status: None (Preliminary result)   Collection Time: 05/16/17 11:33 PM  Result Value Ref Range Status   Specimen Description BLOOD RIGHT HAND  Final   Special Requests   Final    BOTTLES DRAWN AEROBIC AND ANAEROBIC Blood Culture adequate volume   Culture   Final    NO GROWTH 4 DAYS Performed at [facility], [place] [address], [place],  [address]    Report Status PENDING  Incomplete  Aerobic/Anaerobic Culture (surgical/deep wound)     Status: None (Preliminary result)   Collection Time: 05/18/17  7:38 PM  Result Value Ref Range Status   Specimen Description ABSCESS LEFT AXILLARY  Final   Special Requests PATIENT ON FOLLOWING VANCOMYCIN  Final   Gram Stain   Final    RARE WBC PRESENT, PREDOMINANTLY MONONUCLEAR NO ORGANISMS SEEN Performed at [facility], [place] [address], [place],  [address]    Culture   Final    RARE NORMAL SKIN FLORA NO ANAEROBES ISOLATED; CULTURE IN PROGRESS FOR 5 DAYS    Report Status PENDING  Incomplete  MRSA PCR Screening     Status: None   Collection Time: 05/19/17  3:36 AM  Result Value Ref Range Status   MRSA by PCR NEGATIVE NEGATIVE Final    Comment:        The GeneXpert MRSA Assay (FDA approved for NASAL specimens only), is one component of a comprehensive MRSA colonization surveillance program. It is not intended to diagnose MRSA infection nor to guide or monitor treatment for MRSA infections.      Time coordinating discharge: 35 minutes  SIGNED:   Aline August, MD  [facility] 05/21/2017, 11:11 AM Pager: [phone]  If 7PM-7AM, please contact night-coverage [email or web address] Password [email or web address]

## 2017-05-21 NOTE — Progress Notes (Signed)
PT Cancellation Note  Patient Details Name: Jessica [name] MRN: [id number] DOB: [birth year]   Cancelled Treatment:    Reason Eval/Treat Not Completed: Medical issues which prohibited therapy. In bed, resting, reports sleeping after pain medication. Will return at another time as schedule permits.   Marcelino Freestone PT [phone]  05/21/2017, 11:29 AM

## 2017-05-21 NOTE — Discharge Instructions (Signed)
WOUND CARE: - dressing to be changed once daily - supplies: sterile saline, kerlix, scissors, ABD pads, tape  - remove dressing and all packing carefully, moistening with sterile saline as needed to avoid packing/internal dressing sticking to the wound. - clean edges of skin around the wound with water/gauze, making sure there is no tape debris or leakage left on skin that could cause skin irritation or breakdown. - dampen and clean kerlix with sterile saline and pack wound from wound base to skin level, making sure to take note of any possible areas of wound tracking, tunneling and packing appropriately. Wound can be packed loosely. Trim kerlix to size if a whole kerlix is not required. - cover wound with a dry ABD pad and secure with tape.  - write the date/time on the dry dressing/tape to better track when the last dressing change occurred. - change dressing as needed if leakage occurs, wound gets contaminated, or patient requests to shower. - patient may shower daily with wound open and following the shower the wound should be dried and a clean dressing placed.

## 2017-05-21 NOTE — Progress Notes (Signed)
3 Days Post-Op    CC:2x2x2 chronic wound  Subjective: Doesn't feel good this AM.  Nurse changing dressing and it looks good.  Case management has arranged for her to get daily dressing changes at home with [facility].  Objective: Vital signs in last 24 hours: Temp:  [97.7 F (36.5 C)-98.6 F (37 C)] 97.7 F (36.5 C) (07/20 0605) Pulse Rate:  [59-76] 59 (07/20 0605) Resp:  [11-23] 18 (07/20 0605) BP: (123-134)/(54-77) 123/55 (07/20 0605) SpO2:  [87 %-96 %] 95 % (07/20 0605) Last BM Date: 05/16/17  Intake/Output from previous day: 07/19 0701 - 07/20 0700 In: 358 [I.V.:208; IV Piggyback:150] Out: -  Intake/Output this shift: Total I/O In: 360 [P.O.:360] Out: 1 [Urine:1]  General appearance: alert, cooperative and no distress Wound looks good and nurse is packing, she is tolerating it pretty well.  Lab Results:   Recent Labs  05/20/17 0600 05/21/17 0453  WBC 5.1 4.7  HGB 9.7* 9.7*  HCT 29.2* 29.7*  PLT 266 251    BMET  Recent Labs  05/20/17 0600 05/21/17 0453  NA 140 140  K 3.3* 3.7  CL 102 103  CO2 32 31  GLUCOSE 101* 98  BUN 8 14  CREATININE 0.61 0.67  CALCIUM 8.9 8.8*   PT/INR No results for input(s): LABPROT, INR in the last 72 hours.   Recent Labs Lab 05/16/17 2159 05/20/17 0600  AST 27 18  ALT 24 17  ALKPHOS 74 57  BILITOT 0.3 0.5  PROT 7.0 6.5  ALBUMIN 3.3* 3.0*     Lipase  No results found for: LIPASE   Medications: . calcium-vitamin D  1 tablet Oral Daily  . celecoxib  100 mg Oral BID  . Chlorhexidine Gluconate Cloth  6 each Topical Once  . enoxaparin (LOVENOX) injection  40 mg Subcutaneous Q24H  . hydrochlorothiazide  12.5 mg Oral Daily  . levothyroxine  75 mcg Oral Daily  . lip balm  1 application Topical BID  . magic mouthwash  15 mL Oral QID  . pantoprazole  40 mg Oral Daily  . predniSONE  60 mg Oral Q breakfast  . sodium chloride flush  3 mL Intravenous Q12H  . valACYclovir  1,000 mg Oral TID  . venlafaxine XR  225 mg Oral  Daily    Assessment/Plan Axillary wound S/p IRRIGATION AND DRAINAGE OF LEFT AXILLARY SEROMA, 05/18/17, Dr. Michael Boston S/p left breast lumpectomy 03/23/17 Dr.Cornett  I&D in ED 04/27/16 - pan sensitive to MSSA Triple negative stage IIa ductal carcinoma/Chemotherapy 04/28/17/Radiation therapy/Dr. Burney Gauze Tongue swelling- ? angioedema Left corneal shingles - Dr. Kathlen Mody ophthalmologist - Valtrex GERD Hypothyroid Hypertension Depression/Anxiety FEN: Soft diet ID: Valacyclovir 05/17/17 day 3  Vancomycin 7/16 - 7/18 - discontinued DVT: Lovenox    Plan:  She can go home when medically ready.  Follow up with Dr. Brantley Stage in a couple weeks.     LOS: 5 days    Vidal Lampkins 05/21/2017 [phone]

## 2017-05-22 DIAGNOSIS — Z791 Long term (current) use of non-steroidal anti-inflammatories (NSAID): Secondary | ICD-10-CM | POA: Diagnosis not present

## 2017-05-22 DIAGNOSIS — I10 Essential (primary) hypertension: Secondary | ICD-10-CM | POA: Diagnosis not present

## 2017-05-22 DIAGNOSIS — Z79891 Long term (current) use of opiate analgesic: Secondary | ICD-10-CM | POA: Diagnosis not present

## 2017-05-22 DIAGNOSIS — Z7952 Long term (current) use of systemic steroids: Secondary | ICD-10-CM | POA: Diagnosis not present

## 2017-05-22 DIAGNOSIS — K219 Gastro-esophageal reflux disease without esophagitis: Secondary | ICD-10-CM | POA: Diagnosis not present

## 2017-05-22 DIAGNOSIS — E785 Hyperlipidemia, unspecified: Secondary | ICD-10-CM | POA: Diagnosis not present

## 2017-05-22 DIAGNOSIS — J45909 Unspecified asthma, uncomplicated: Secondary | ICD-10-CM | POA: Diagnosis not present

## 2017-05-22 DIAGNOSIS — Z4801 Encounter for change or removal of surgical wound dressing: Secondary | ICD-10-CM | POA: Diagnosis not present

## 2017-05-22 DIAGNOSIS — E039 Hypothyroidism, unspecified: Secondary | ICD-10-CM | POA: Diagnosis not present

## 2017-05-22 DIAGNOSIS — C50212 Malignant neoplasm of upper-inner quadrant of left female breast: Secondary | ICD-10-CM | POA: Diagnosis not present

## 2017-05-22 DIAGNOSIS — L03112 Cellulitis of left axilla: Secondary | ICD-10-CM | POA: Diagnosis not present

## 2017-05-22 DIAGNOSIS — L02412 Cutaneous abscess of left axilla: Secondary | ICD-10-CM | POA: Diagnosis not present

## 2017-05-22 LAB — CULTURE, BLOOD (ROUTINE X 2)
Culture: NO GROWTH
Culture: NO GROWTH
Special Requests: ADEQUATE
Special Requests: ADEQUATE

## 2017-05-23 DIAGNOSIS — I10 Essential (primary) hypertension: Secondary | ICD-10-CM | POA: Diagnosis not present

## 2017-05-23 DIAGNOSIS — K219 Gastro-esophageal reflux disease without esophagitis: Secondary | ICD-10-CM | POA: Diagnosis not present

## 2017-05-23 DIAGNOSIS — L02412 Cutaneous abscess of left axilla: Secondary | ICD-10-CM | POA: Diagnosis not present

## 2017-05-23 DIAGNOSIS — E785 Hyperlipidemia, unspecified: Secondary | ICD-10-CM | POA: Diagnosis not present

## 2017-05-23 DIAGNOSIS — Z79891 Long term (current) use of opiate analgesic: Secondary | ICD-10-CM | POA: Diagnosis not present

## 2017-05-23 DIAGNOSIS — J45909 Unspecified asthma, uncomplicated: Secondary | ICD-10-CM | POA: Diagnosis not present

## 2017-05-23 DIAGNOSIS — C50212 Malignant neoplasm of upper-inner quadrant of left female breast: Secondary | ICD-10-CM | POA: Diagnosis not present

## 2017-05-23 DIAGNOSIS — E039 Hypothyroidism, unspecified: Secondary | ICD-10-CM | POA: Diagnosis not present

## 2017-05-23 DIAGNOSIS — Z7952 Long term (current) use of systemic steroids: Secondary | ICD-10-CM | POA: Diagnosis not present

## 2017-05-23 DIAGNOSIS — L03112 Cellulitis of left axilla: Secondary | ICD-10-CM | POA: Diagnosis not present

## 2017-05-23 DIAGNOSIS — Z791 Long term (current) use of non-steroidal anti-inflammatories (NSAID): Secondary | ICD-10-CM | POA: Diagnosis not present

## 2017-05-23 DIAGNOSIS — Z4801 Encounter for change or removal of surgical wound dressing: Secondary | ICD-10-CM | POA: Diagnosis not present

## 2017-05-24 DIAGNOSIS — K219 Gastro-esophageal reflux disease without esophagitis: Secondary | ICD-10-CM | POA: Diagnosis not present

## 2017-05-24 DIAGNOSIS — C50212 Malignant neoplasm of upper-inner quadrant of left female breast: Secondary | ICD-10-CM | POA: Diagnosis not present

## 2017-05-24 DIAGNOSIS — Z791 Long term (current) use of non-steroidal anti-inflammatories (NSAID): Secondary | ICD-10-CM | POA: Diagnosis not present

## 2017-05-24 DIAGNOSIS — Z7952 Long term (current) use of systemic steroids: Secondary | ICD-10-CM | POA: Diagnosis not present

## 2017-05-24 DIAGNOSIS — J45909 Unspecified asthma, uncomplicated: Secondary | ICD-10-CM | POA: Diagnosis not present

## 2017-05-24 DIAGNOSIS — E039 Hypothyroidism, unspecified: Secondary | ICD-10-CM | POA: Diagnosis not present

## 2017-05-24 DIAGNOSIS — L02412 Cutaneous abscess of left axilla: Secondary | ICD-10-CM | POA: Diagnosis not present

## 2017-05-24 DIAGNOSIS — B0230 Zoster ocular disease, unspecified: Secondary | ICD-10-CM | POA: Diagnosis not present

## 2017-05-24 DIAGNOSIS — Z4801 Encounter for change or removal of surgical wound dressing: Secondary | ICD-10-CM | POA: Diagnosis not present

## 2017-05-24 DIAGNOSIS — L03112 Cellulitis of left axilla: Secondary | ICD-10-CM | POA: Diagnosis not present

## 2017-05-24 DIAGNOSIS — I10 Essential (primary) hypertension: Secondary | ICD-10-CM | POA: Diagnosis not present

## 2017-05-24 DIAGNOSIS — Z79891 Long term (current) use of opiate analgesic: Secondary | ICD-10-CM | POA: Diagnosis not present

## 2017-05-24 DIAGNOSIS — H04332 Acute lacrimal canaliculitis of left lacrimal passage: Secondary | ICD-10-CM | POA: Diagnosis not present

## 2017-05-24 DIAGNOSIS — E785 Hyperlipidemia, unspecified: Secondary | ICD-10-CM | POA: Diagnosis not present

## 2017-05-24 LAB — AEROBIC/ANAEROBIC CULTURE (SURGICAL/DEEP WOUND)

## 2017-05-26 DIAGNOSIS — L02412 Cutaneous abscess of left axilla: Secondary | ICD-10-CM | POA: Diagnosis not present

## 2017-05-26 DIAGNOSIS — L03112 Cellulitis of left axilla: Secondary | ICD-10-CM | POA: Diagnosis not present

## 2017-05-26 DIAGNOSIS — I10 Essential (primary) hypertension: Secondary | ICD-10-CM | POA: Diagnosis not present

## 2017-05-26 DIAGNOSIS — Z79891 Long term (current) use of opiate analgesic: Secondary | ICD-10-CM | POA: Diagnosis not present

## 2017-05-26 DIAGNOSIS — C50919 Malignant neoplasm of unspecified site of unspecified female breast: Secondary | ICD-10-CM | POA: Diagnosis not present

## 2017-05-26 DIAGNOSIS — Z4801 Encounter for change or removal of surgical wound dressing: Secondary | ICD-10-CM | POA: Diagnosis not present

## 2017-05-26 DIAGNOSIS — R229 Localized swelling, mass and lump, unspecified: Secondary | ICD-10-CM | POA: Diagnosis not present

## 2017-05-26 DIAGNOSIS — Z9889 Other specified postprocedural states: Secondary | ICD-10-CM | POA: Diagnosis not present

## 2017-05-26 DIAGNOSIS — E785 Hyperlipidemia, unspecified: Secondary | ICD-10-CM | POA: Diagnosis not present

## 2017-05-26 DIAGNOSIS — C50212 Malignant neoplasm of upper-inner quadrant of left female breast: Secondary | ICD-10-CM | POA: Diagnosis not present

## 2017-05-26 DIAGNOSIS — Z791 Long term (current) use of non-steroidal anti-inflammatories (NSAID): Secondary | ICD-10-CM | POA: Diagnosis not present

## 2017-05-26 DIAGNOSIS — T783XXA Angioneurotic edema, initial encounter: Secondary | ICD-10-CM | POA: Insufficient documentation

## 2017-05-26 DIAGNOSIS — J45909 Unspecified asthma, uncomplicated: Secondary | ICD-10-CM | POA: Diagnosis not present

## 2017-05-26 DIAGNOSIS — K219 Gastro-esophageal reflux disease without esophagitis: Secondary | ICD-10-CM | POA: Diagnosis not present

## 2017-05-26 DIAGNOSIS — Z7952 Long term (current) use of systemic steroids: Secondary | ICD-10-CM | POA: Diagnosis not present

## 2017-05-26 DIAGNOSIS — E039 Hypothyroidism, unspecified: Secondary | ICD-10-CM | POA: Diagnosis not present

## 2017-05-27 DIAGNOSIS — T783XXD Angioneurotic edema, subsequent encounter: Secondary | ICD-10-CM | POA: Diagnosis not present

## 2017-05-27 DIAGNOSIS — T464X5D Adverse effect of angiotensin-converting-enzyme inhibitors, subsequent encounter: Secondary | ICD-10-CM | POA: Diagnosis not present

## 2017-05-27 DIAGNOSIS — L03112 Cellulitis of left axilla: Secondary | ICD-10-CM | POA: Diagnosis not present

## 2017-05-27 DIAGNOSIS — Z4801 Encounter for change or removal of surgical wound dressing: Secondary | ICD-10-CM | POA: Diagnosis not present

## 2017-05-27 DIAGNOSIS — C50912 Malignant neoplasm of unspecified site of left female breast: Secondary | ICD-10-CM | POA: Diagnosis not present

## 2017-05-28 DIAGNOSIS — C50212 Malignant neoplasm of upper-inner quadrant of left female breast: Secondary | ICD-10-CM | POA: Diagnosis not present

## 2017-05-28 DIAGNOSIS — Z791 Long term (current) use of non-steroidal anti-inflammatories (NSAID): Secondary | ICD-10-CM | POA: Diagnosis not present

## 2017-05-28 DIAGNOSIS — J45909 Unspecified asthma, uncomplicated: Secondary | ICD-10-CM | POA: Diagnosis not present

## 2017-05-28 DIAGNOSIS — L03112 Cellulitis of left axilla: Secondary | ICD-10-CM | POA: Diagnosis not present

## 2017-05-28 DIAGNOSIS — Z4801 Encounter for change or removal of surgical wound dressing: Secondary | ICD-10-CM | POA: Diagnosis not present

## 2017-05-28 DIAGNOSIS — K219 Gastro-esophageal reflux disease without esophagitis: Secondary | ICD-10-CM | POA: Diagnosis not present

## 2017-05-28 DIAGNOSIS — L02412 Cutaneous abscess of left axilla: Secondary | ICD-10-CM | POA: Diagnosis not present

## 2017-05-28 DIAGNOSIS — I10 Essential (primary) hypertension: Secondary | ICD-10-CM | POA: Diagnosis not present

## 2017-05-28 DIAGNOSIS — E785 Hyperlipidemia, unspecified: Secondary | ICD-10-CM | POA: Diagnosis not present

## 2017-05-28 DIAGNOSIS — Z7952 Long term (current) use of systemic steroids: Secondary | ICD-10-CM | POA: Diagnosis not present

## 2017-05-28 DIAGNOSIS — E039 Hypothyroidism, unspecified: Secondary | ICD-10-CM | POA: Diagnosis not present

## 2017-05-28 DIAGNOSIS — Z79891 Long term (current) use of opiate analgesic: Secondary | ICD-10-CM | POA: Diagnosis not present

## 2017-05-30 DIAGNOSIS — C50212 Malignant neoplasm of upper-inner quadrant of left female breast: Secondary | ICD-10-CM | POA: Diagnosis not present

## 2017-05-30 DIAGNOSIS — Z4801 Encounter for change or removal of surgical wound dressing: Secondary | ICD-10-CM | POA: Diagnosis not present

## 2017-05-30 DIAGNOSIS — Z7952 Long term (current) use of systemic steroids: Secondary | ICD-10-CM | POA: Diagnosis not present

## 2017-05-30 DIAGNOSIS — L02412 Cutaneous abscess of left axilla: Secondary | ICD-10-CM | POA: Diagnosis not present

## 2017-05-30 DIAGNOSIS — J45909 Unspecified asthma, uncomplicated: Secondary | ICD-10-CM | POA: Diagnosis not present

## 2017-05-30 DIAGNOSIS — Z79891 Long term (current) use of opiate analgesic: Secondary | ICD-10-CM | POA: Diagnosis not present

## 2017-05-30 DIAGNOSIS — I10 Essential (primary) hypertension: Secondary | ICD-10-CM | POA: Diagnosis not present

## 2017-05-30 DIAGNOSIS — K219 Gastro-esophageal reflux disease without esophagitis: Secondary | ICD-10-CM | POA: Diagnosis not present

## 2017-05-30 DIAGNOSIS — E039 Hypothyroidism, unspecified: Secondary | ICD-10-CM | POA: Diagnosis not present

## 2017-05-30 DIAGNOSIS — L03112 Cellulitis of left axilla: Secondary | ICD-10-CM | POA: Diagnosis not present

## 2017-05-30 DIAGNOSIS — Z791 Long term (current) use of non-steroidal anti-inflammatories (NSAID): Secondary | ICD-10-CM | POA: Diagnosis not present

## 2017-05-30 DIAGNOSIS — E785 Hyperlipidemia, unspecified: Secondary | ICD-10-CM | POA: Diagnosis not present

## 2017-06-02 DIAGNOSIS — Z79891 Long term (current) use of opiate analgesic: Secondary | ICD-10-CM | POA: Diagnosis not present

## 2017-06-02 DIAGNOSIS — L03112 Cellulitis of left axilla: Secondary | ICD-10-CM | POA: Diagnosis not present

## 2017-06-02 DIAGNOSIS — E785 Hyperlipidemia, unspecified: Secondary | ICD-10-CM | POA: Diagnosis not present

## 2017-06-02 DIAGNOSIS — Z791 Long term (current) use of non-steroidal anti-inflammatories (NSAID): Secondary | ICD-10-CM | POA: Diagnosis not present

## 2017-06-02 DIAGNOSIS — C50212 Malignant neoplasm of upper-inner quadrant of left female breast: Secondary | ICD-10-CM | POA: Diagnosis not present

## 2017-06-02 DIAGNOSIS — Z4801 Encounter for change or removal of surgical wound dressing: Secondary | ICD-10-CM | POA: Diagnosis not present

## 2017-06-02 DIAGNOSIS — I10 Essential (primary) hypertension: Secondary | ICD-10-CM | POA: Diagnosis not present

## 2017-06-02 DIAGNOSIS — E039 Hypothyroidism, unspecified: Secondary | ICD-10-CM | POA: Diagnosis not present

## 2017-06-02 DIAGNOSIS — J45909 Unspecified asthma, uncomplicated: Secondary | ICD-10-CM | POA: Diagnosis not present

## 2017-06-02 DIAGNOSIS — K219 Gastro-esophageal reflux disease without esophagitis: Secondary | ICD-10-CM | POA: Diagnosis not present

## 2017-06-02 DIAGNOSIS — Z7952 Long term (current) use of systemic steroids: Secondary | ICD-10-CM | POA: Diagnosis not present

## 2017-06-02 DIAGNOSIS — L02412 Cutaneous abscess of left axilla: Secondary | ICD-10-CM | POA: Diagnosis not present

## 2017-06-07 DIAGNOSIS — K219 Gastro-esophageal reflux disease without esophagitis: Secondary | ICD-10-CM | POA: Diagnosis not present

## 2017-06-07 DIAGNOSIS — J45909 Unspecified asthma, uncomplicated: Secondary | ICD-10-CM | POA: Diagnosis not present

## 2017-06-07 DIAGNOSIS — I10 Essential (primary) hypertension: Secondary | ICD-10-CM | POA: Diagnosis not present

## 2017-06-07 DIAGNOSIS — Z79891 Long term (current) use of opiate analgesic: Secondary | ICD-10-CM | POA: Diagnosis not present

## 2017-06-07 DIAGNOSIS — C50212 Malignant neoplasm of upper-inner quadrant of left female breast: Secondary | ICD-10-CM | POA: Diagnosis not present

## 2017-06-07 DIAGNOSIS — Z7952 Long term (current) use of systemic steroids: Secondary | ICD-10-CM | POA: Diagnosis not present

## 2017-06-07 DIAGNOSIS — Z4801 Encounter for change or removal of surgical wound dressing: Secondary | ICD-10-CM | POA: Diagnosis not present

## 2017-06-07 DIAGNOSIS — E039 Hypothyroidism, unspecified: Secondary | ICD-10-CM | POA: Diagnosis not present

## 2017-06-07 DIAGNOSIS — L02412 Cutaneous abscess of left axilla: Secondary | ICD-10-CM | POA: Diagnosis not present

## 2017-06-07 DIAGNOSIS — Z791 Long term (current) use of non-steroidal anti-inflammatories (NSAID): Secondary | ICD-10-CM | POA: Diagnosis not present

## 2017-06-07 DIAGNOSIS — E785 Hyperlipidemia, unspecified: Secondary | ICD-10-CM | POA: Diagnosis not present

## 2017-06-07 DIAGNOSIS — L03112 Cellulitis of left axilla: Secondary | ICD-10-CM | POA: Diagnosis not present

## 2017-06-08 DIAGNOSIS — B0230 Zoster ocular disease, unspecified: Secondary | ICD-10-CM | POA: Diagnosis not present

## 2017-06-08 DIAGNOSIS — H04332 Acute lacrimal canaliculitis of left lacrimal passage: Secondary | ICD-10-CM | POA: Diagnosis not present

## 2017-06-09 ENCOUNTER — Ambulatory Visit (HOSPITAL_BASED_OUTPATIENT_CLINIC_OR_DEPARTMENT_OTHER): Payer: Medicare Other

## 2017-06-09 ENCOUNTER — Ambulatory Visit (HOSPITAL_BASED_OUTPATIENT_CLINIC_OR_DEPARTMENT_OTHER): Payer: Medicare Other | Admitting: Hematology & Oncology

## 2017-06-09 ENCOUNTER — Other Ambulatory Visit (HOSPITAL_BASED_OUTPATIENT_CLINIC_OR_DEPARTMENT_OTHER): Payer: Medicare Other

## 2017-06-09 ENCOUNTER — Ambulatory Visit: Payer: Medicare Other

## 2017-06-09 VITALS — BP 165/78 | HR 62 | Temp 98.5°F | Wt 166.0 lb

## 2017-06-09 DIAGNOSIS — Z5189 Encounter for other specified aftercare: Secondary | ICD-10-CM | POA: Diagnosis not present

## 2017-06-09 DIAGNOSIS — C50212 Malignant neoplasm of upper-inner quadrant of left female breast: Secondary | ICD-10-CM

## 2017-06-09 DIAGNOSIS — B958 Unspecified staphylococcus as the cause of diseases classified elsewhere: Secondary | ICD-10-CM

## 2017-06-09 DIAGNOSIS — L03112 Cellulitis of left axilla: Secondary | ICD-10-CM | POA: Diagnosis not present

## 2017-06-09 DIAGNOSIS — Z171 Estrogen receptor negative status [ER-]: Secondary | ICD-10-CM | POA: Diagnosis not present

## 2017-06-09 DIAGNOSIS — Z170 Estrogen receptor positive status [ER+]: Secondary | ICD-10-CM

## 2017-06-09 LAB — CBC WITH DIFFERENTIAL (CANCER CENTER ONLY)
BASO#: 0 10*3/uL (ref 0.0–0.2)
BASO%: 0.3 % (ref 0.0–2.0)
EOS%: 6.3 % (ref 0.0–7.0)
Eosinophils Absolute: 0.4 10*3/uL (ref 0.0–0.5)
HCT: 37.6 % (ref 34.8–46.6)
HGB: 12.2 g/dL (ref 11.6–15.9)
LYMPH#: 1.7 10*3/uL (ref 0.9–3.3)
LYMPH%: 24 % (ref 14.0–48.0)
MCH: 33.5 pg (ref 26.0–34.0)
MCHC: 32.4 g/dL (ref 32.0–36.0)
MCV: 103 fL — ABNORMAL HIGH (ref 81–101)
MONO#: 0.4 10*3/uL (ref 0.1–0.9)
MONO%: 6.4 % (ref 0.0–13.0)
NEUT#: 4.3 10*3/uL (ref 1.5–6.5)
NEUT%: 63 % (ref 39.6–80.0)
Platelets: 239 10*3/uL (ref 145–400)
RBC: 3.64 10*6/uL — ABNORMAL LOW (ref 3.70–5.32)
RDW: 16.3 % — ABNORMAL HIGH (ref 11.1–15.7)
WBC: 6.9 10*3/uL (ref 3.9–10.0)

## 2017-06-09 MED ORDER — ALTEPLASE 2 MG IJ SOLR
INTRAMUSCULAR | Status: AC
  Filled 2017-06-09: qty 2

## 2017-06-09 MED ORDER — STERILE WATER FOR INJECTION IJ SOLN
INTRAMUSCULAR | Status: AC
  Filled 2017-06-09: qty 10

## 2017-06-09 MED ORDER — ALTEPLASE 2 MG IJ SOLR
2.0000 mg | Freq: Once | INTRAMUSCULAR | Status: AC
  Administered 2017-06-09: 2 mg
  Filled 2017-06-09: qty 2

## 2017-06-09 NOTE — Patient Instructions (Signed)
Implanted Port Insertion, Care After °This sheet gives you information about how to care for yourself after your procedure. Your health care provider may also give you more specific instructions. If you have problems or questions, contact your health care provider. °What can I expect after the procedure? °After your procedure, it is common to have: °· Discomfort at the port insertion site. °· Bruising on the skin over the port. This should improve over 3-4 days. ° °Follow these instructions at home: °Port care °· After your port is placed, you will get a manufacturer's information card. The card has information about your port. Keep this card with you at all times. °· Take care of the port as told by your health care provider. Ask your health care provider if you or a family member can get training for taking care of the port at home. A home health care nurse may also take care of the port. °· Make sure to remember what type of port you have. °Incision care °· Follow instructions from your health care provider about how to take care of your port insertion site. Make sure you: °? Wash your hands with soap and water before you change your bandage (dressing). If soap and water are not available, use hand sanitizer. °? Change your dressing as told by your health care provider. °? Leave stitches (sutures), skin glue, or adhesive strips in place. These skin closures may need to stay in place for 2 weeks or longer. If adhesive strip edges start to loosen and curl up, you may trim the loose edges. Do not remove adhesive strips completely unless your health care provider tells you to do that. °· Check your port insertion site every day for signs of infection. Check for: °? More redness, swelling, or pain. °? More fluid or blood. °? Warmth. °? Pus or a bad smell. °General instructions °· Do not take baths, swim, or use a hot tub until your health care provider approves. °· Do not lift anything that is heavier than 10 lb (4.5  kg) for a week, or as told by your health care provider. °· Ask your health care provider when it is okay to: °? Return to work or school. °? Resume usual physical activities or sports. °· Do not drive for 24 hours if you were given a medicine to help you relax (sedative). °· Take over-the-counter and prescription medicines only as told by your health care provider. °· Wear a medical alert bracelet in case of an emergency. This will tell any health care providers that you have a port. °· Keep all follow-up visits as told by your health care provider. This is important. °Contact a health care provider if: °· You cannot flush your port with saline as directed, or you cannot draw blood from the port. °· You have a fever or chills. °· You have more redness, swelling, or pain around your port insertion site. °· You have more fluid or blood coming from your port insertion site. °· Your port insertion site feels warm to the touch. °· You have pus or a bad smell coming from the port insertion site. °Get help right away if: °· You have chest pain or shortness of breath. °· You have bleeding from your port that you cannot control. °Summary °· Take care of the port as told by your health care provider. °· Change your dressing as told by your health care provider. °· Keep all follow-up visits as told by your health care provider. °  This information is not intended to replace advice given to you by your health care provider. Make sure you discuss any questions you have with your health care provider. °Document Released: 08/09/2013 Document Revised: 09/09/2016 Document Reviewed: 09/09/2016 °Elsevier Interactive Patient Education © 2017 [employer] ° °

## 2017-06-09 NOTE — Progress Notes (Signed)
Unable to get blood return from port.  Flushes easily.  No pain upon flushing.  Easy access.  Placed patient in all different positions and flushed numerous times without blood return.  Order received for Alteplase. Cathflo instilled in 2.2 ml of Sterile water.  1305.  No blood able to get from [facility] however did see flash of blood in tube when clamped.  Dr. Marin Olp states to take it out.  Port deaccessed and patient dcd to home

## 2017-06-09 NOTE — Patient Instructions (Signed)
Alteplase, TPA injection What is this medicine? ALTEPLASE (AL te plase) can dissolve blood clots that form in the heart, blood vessels, or lungs after a heart attack. This medicine is also given to improve recovery and decrease the chance of disability in patients having symptoms of a stroke. This medicine may be used for other purposes; ask your health care provider or pharmacist if you have questions. COMMON BRAND NAME(S): Activase, Cathflo Activase What should I tell my health care provider before I take this medicine? They need to know if you have any of these conditions: -aneurysm -bleeding problems or problems with blood clotting -blood vessel disease or damaged blood vessels -diabetic retinopathy -head injury or tumor -high blood pressure -infection -irregular heartbeats -previous stroke -recent biopsy or surgery -an unusual or allergic reaction to alteplase, other medicines, foods, dyes, or preservatives -pregnant or trying to get pregnant -breast-feeding How should I use this medicine? This medicine is for injection into a vein. It is given by a health care professional in a hospital or clinic setting. Talk to your pediatrician regarding the use of this medicine in children. Special care may be needed. Overdosage: If you think you have taken too much of this medicine contact a poison control center or emergency room at once. NOTE: This medicine is only for you. Do not share this medicine with others. What if I miss a dose? This does not apply. What may interact with this medicine? Do not take this medicine with any of the following medications: -aminocaproic acid -aprotinin -tranexamic acid This medicine may also interact with the following medications: -antiinflammatory drugs, NSAIDs like ibuprofen -aspirin and aspirin-like medicines -blood thinners, like warfarin, heparin or enoxaparin -dipyridamole This list may not describe all possible interactions. Give your health  care provider a list of all the medicines, herbs, non-prescription drugs, or dietary supplements you use. Also tell them if you smoke, drink alcohol, or use illegal drugs. Some items may interact with your medicine. What should I watch for while using this medicine? Your condition will be monitored carefully while you are receiving this medicine. Follow the advice of your doctor or health care professional exactly. You may need bed rest to minimize the risk of bleeding. This medicine can make you bleed more easily. This effect can last for several days. Take special care brushing or flossing your teeth. Do not take aspirin, ibuprofen, or other nonprescription pain relievers during or for several days after alteplase treatment unless otherwise instructed by your doctor or health care professional. You may feel dizzy or lightheaded. To avoid the risk of dizzy or fainting spells, sit or stand up slowly, especially if you are an older patient. What side effects may I notice from receiving this medicine? Side effects that you should report to your doctor or health care professional as soon as possible: -allergic reactions like skin rash, itching or hives, swelling of the face, lips, or tongue -signs and symptoms of bleeding such as bloody or black, tarry stools; red or dark-brown urine; spitting up blood or brown material that looks like coffee grounds; red spots on the skin; unusual bruising or bleeding from the eye, gums, or nose -signs and symptoms of a stroke such as changes in vision; confusion; trouble speaking or understanding; severe headaches; sudden numbness or weakness of the face, arm, or leg; trouble walking; dizziness; loss of balance or coordination -slow or fast heart rate Side effects that usually do not require medical attention (report to your doctor or health care professional   if they continue or are bothersome): -dizziness -fever -nausea, vomiting This list may not describe all  possible side effects. Call your doctor for medical advice about side effects. You may report side effects to FDA at 1-800-FDA-1088. Where should I keep my medicine? This does not apply. You will not be given this medicine to store at home. NOTE: This sheet is a summary. It may not cover all possible information. If you have questions about this medicine, talk to your doctor, pharmacist, or health care provider.  2018 Elsevier/Gold Standard (2013-02-14 19:16:18)  

## 2017-06-14 NOTE — Progress Notes (Signed)
Hematology and Oncology Follow Up Visit  Jessica [name] [id number] October 12, 1941 76 y.o. 06/14/2017   Principle Diagnosis:   Stage IIA ([id number]) invasive ductal ca of the LEFT breast - TRIPLE NEGATIVE  Staphylococcus aureus wound infection of the LEFT axilla  Current Therapy:    Taxotere/Carboplatin - s/p cycle #1 on 04/28/2017     Interim History:  Jessica [name] is back for follow-up.she's really had a very tough time since we last saw her. She's been hospitalized. She needed surgery for debridement of an abscess in the left axilla. She is found to have staph. This is Staph aureus.  She has been on antibiotics.  She goes back to see the surgeon today.  She just is not ready for any additional chemotherapy.  It is been very tough on her. Thank you, she does have quite a bit of family support.  Overall, thought she tolerated the first cycle of treatment pretty well.  She's had no bleeding. He's had no diarrhea. She's had no cough. She's had no rashes. There's been no leg swelling.   Overall, her performance status is ECOG 1.  Medications:  Current Outpatient Prescriptions:  .  polyvinyl alcohol (LIQUIFILM TEARS) 1.4 % ophthalmic solution, 1 drop as needed for dry eyes., Disp: , Rfl:  .  calcium citrate-vitamin D (CITRACAL+D) 315-200 MG-UNIT per tablet, Take 1 tablet by mouth daily., Disp: , Rfl:  .  diphenhydramine-acetaminophen (TYLENOL PM) 25-500 MG TABS, Take 1 tablet by mouth at bedtime as needed (sleep). , Disp: , Rfl:  .  hydrochlorothiazide (MICROZIDE) 12.5 MG capsule, Take 12.5 mg by mouth daily., Disp: , Rfl: 1 .  levothyroxine (SYNTHROID, LEVOTHROID) 75 MCG tablet, Take 75 mcg by mouth daily., Disp: , Rfl:  .  lidocaine-prilocaine (EMLA) cream, Apply 1 application topically as needed., Disp: 30 g, Rfl: 6 .  LORazepam (ATIVAN) 0.5 MG tablet, Take 1 tablet (0.5 mg total) by mouth every 6 (six) hours as needed (Nausea or vomiting)., Disp: 30 tablet, Rfl: 0 .  Lysine  1000 MG TABS, Take 1,000 mg by mouth as needed., Disp: , Rfl:  .  Multiple Vitamin (MULTIVITAMIN) tablet, Take 1 tablet by mouth daily., Disp: , Rfl:  .  omeprazole (PRILOSEC OTC) 20 MG tablet, Take 20 mg by mouth daily., Disp: , Rfl:  .  ondansetron (ZOFRAN) 8 MG tablet, Take 1 tablet (8 mg total) by mouth 2 (two) times daily as needed for refractory nausea / vomiting. Start on day 3 after chemo., Disp: 30 tablet, Rfl: 1 .  OVER THE COUNTER MEDICATION, Take 15 mLs by mouth 2 (two) times daily as needed (pain)., Disp: , Rfl:  .  prochlorperazine (COMPAZINE) 10 MG tablet, TAKE 1 TABLET(10 MG) BY MOUTH EVERY 6 HOURS AS NEEDED FOR NAUSEA OR VOMITING, Disp: 385 tablet, Rfl: 1 .  venlafaxine XR (EFFEXOR-XR) 75 MG 24 hr capsule, Take 225 mg by mouth daily., Disp: , Rfl:   Allergies:  Allergies  Allergen Reactions  . Codeine Hives  . Penicillins Hives    Has patient had a PCN reaction causing immediate rash, facial/tongue/throat swelling, SOB or lightheadedness with hypotension: yes Has patient had a PCN reaction causing severe rash involving mucus membranes or skin necrosis:  no Has patient had a PCN reaction that required hospitalization:  no Has patient had a PCN reaction occurring within the last 10 years: no If all of the above answers are "NO", then may proceed with Cephalosporin use.   . Advil [Ibuprofen] Hives and Itching  .  Band-Aid Plus Antibiotic [Bacitracin-Polymyxin B] Dermatitis  . Benazepril Swelling    Possible cause of tongue swelling    Past Medical History, Surgical history, Social history, and Family History were reviewed and updated.  Review of Systems:  As above  Physical Exam:  weight is 166 lb (75.3 kg). Her oral temperature is 98.5 F (36.9 C). Her blood pressure is 165/78 (abnormal) and her pulse is 62.   Wt Readings from Last 3 Encounters:  06/09/17 166 lb (75.3 kg)  05/17/17 156 lb 8.4 oz (71 kg)  04/27/17 175 lb (79.4 kg)      Head and neck exam shows no  ocular or oral lesions. There are no palpable cervical or supraclavicular lymph nodes. Lungs are clear. Cardiac exam regular rate and rhythm with no murmurs, rubs or bruits. Breast exam shows right breast with no masses, edema or erythema. There is no right axillary adenopathy. Left breast shows the well healing lumpectomy scar at the 12:00 position. This is at the age of the areola. There is some slight erythema. There is some slight firmness. The lumpectomy scar is well-healing. There is no swelling in the left axilla. She does have the packing in the cavity in the left axilla. Abdomen is soft. She has good bowel sounds. There is no palpable liver or spleen tip. Back exam shows no tenderness over the spine, ribs or hips. Extremities shows no clubbing, cyanosis or edema. Neurological exam shows no focal neurological deficits. Skin exam shows no rashes, ecchymoses or petechia.  Lab Results  Component Value Date   WBC 6.9 06/09/2017   HGB 12.2 06/09/2017   HCT 37.6 06/09/2017   MCV 103 fingerstick (H) 06/09/2017   PLT 239 06/09/2017     Chemistry      Component Value Date/Time   NA 140 05/21/2017 0453   NA 129 04/26/2017 0827   NA 139 04/12/2017 1043   K 3.7 05/21/2017 0453   K 3.1 (L) 04/26/2017 0827   K 3.9 04/12/2017 1043   CL 103 05/21/2017 0453   CL 96 (L) 04/26/2017 0827   CO2 31 05/21/2017 0453   CO2 29 04/26/2017 0827   CO2 28 04/12/2017 1043   BUN 14 05/21/2017 0453   BUN 10 04/26/2017 0827   BUN 12.3 04/12/2017 1043   CREATININE 0.67 05/21/2017 0453   CREATININE 0.7 04/26/2017 0827   CREATININE 0.8 04/12/2017 1043      Component Value Date/Time   CALCIUM 8.8 (L) 05/21/2017 0453   CALCIUM 9.2 04/26/2017 0827   CALCIUM 9.8 04/12/2017 1043   ALKPHOS 57 05/20/2017 0600   ALKPHOS 58 04/26/2017 0827   ALKPHOS 61 04/12/2017 1043   AST 18 05/20/2017 0600   AST 27 04/26/2017 0827   AST 15 04/12/2017 1043   ALT 17 05/20/2017 0600   ALT 30 04/26/2017 0827   ALT 13 04/12/2017  1043   BILITOT 0.5 05/20/2017 0600   BILITOT 0.60 04/26/2017 0827   BILITOT 0.27 04/12/2017 1043         Impression and Plan: Jessica [name] is a 76 year old postmenopausal white female with a triple negative stage IIA ductal carcinoma of the left breast. I just feel so bad for her that she has had this infection. I Mosher that this is related to the chemotherapy. This all started just a couple days after her chemotherapy. She was never neutropenic.  We just are not going to be able to treat her right now. Again given that she has triple negative breast  cancer, she definitely needs something.  or 6 small meals a day. I told her that she can have anything that she wants. She wants fresh fruits and vegetables, I think this would be okay.   She will come back to see [place] in 4 weeks. By then, she should definitely be ready for treatment. If at that time, she is not able take treatment, not sure it reshoot even proceed with any further therapy.  I spent about 35 minutes with she and her daughter.     [name], MD 8/13/20188:19 AM

## 2017-06-16 ENCOUNTER — Ambulatory Visit (HOSPITAL_BASED_OUTPATIENT_CLINIC_OR_DEPARTMENT_OTHER): Payer: Medicare Other | Admitting: Genetic Counselor

## 2017-06-16 ENCOUNTER — Other Ambulatory Visit (HOSPITAL_BASED_OUTPATIENT_CLINIC_OR_DEPARTMENT_OTHER): Payer: Medicare Other

## 2017-06-16 ENCOUNTER — Encounter: Payer: Self-pay | Admitting: Genetic Counselor

## 2017-06-16 DIAGNOSIS — Z8042 Family history of malignant neoplasm of prostate: Secondary | ICD-10-CM | POA: Diagnosis not present

## 2017-06-16 DIAGNOSIS — Z170 Estrogen receptor positive status [ER+]: Secondary | ICD-10-CM

## 2017-06-16 DIAGNOSIS — Z171 Estrogen receptor negative status [ER-]: Secondary | ICD-10-CM | POA: Diagnosis not present

## 2017-06-16 DIAGNOSIS — Z803 Family history of malignant neoplasm of breast: Secondary | ICD-10-CM | POA: Insufficient documentation

## 2017-06-16 DIAGNOSIS — C50212 Malignant neoplasm of upper-inner quadrant of left female breast: Secondary | ICD-10-CM

## 2017-06-16 DIAGNOSIS — Z808 Family history of malignant neoplasm of other organs or systems: Secondary | ICD-10-CM | POA: Diagnosis not present

## 2017-06-16 DIAGNOSIS — Z7183 Encounter for nonprocreative genetic counseling: Secondary | ICD-10-CM

## 2017-06-16 LAB — CBC WITH DIFFERENTIAL/PLATELET
BASO%: 0.3 % (ref 0.0–2.0)
Basophils Absolute: 0 10*3/uL (ref 0.0–0.1)
EOS%: 1 % (ref 0.0–7.0)
Eosinophils Absolute: 0 10*3/uL (ref 0.0–0.5)
HCT: 37 % (ref 34.8–46.6)
HGB: 11.8 g/dL (ref 11.6–15.9)
LYMPH%: 33.8 % (ref 14.0–49.7)
MCH: 32.5 pg (ref 25.1–34.0)
MCHC: 31.9 g/dL (ref 31.5–36.0)
MCV: 101.9 fL — ABNORMAL HIGH (ref 79.5–101.0)
MONO#: 0.5 10*3/uL (ref 0.1–0.9)
MONO%: 12.1 % (ref 0.0–14.0)
NEUT#: 2.1 10*3/uL (ref 1.5–6.5)
NEUT%: 52.8 % (ref 38.4–76.8)
Platelets: 291 10*3/uL (ref 145–400)
RBC: 3.63 10*6/uL — ABNORMAL LOW (ref 3.70–5.45)
RDW: 16 % — ABNORMAL HIGH (ref 11.2–14.5)
WBC: 4 10*3/uL (ref 3.9–10.3)
lymph#: 1.3 10*3/uL (ref 0.9–3.3)

## 2017-06-16 LAB — COMPREHENSIVE METABOLIC PANEL
ALT: 20 U/L (ref 0–55)
AST: 21 U/L (ref 5–34)
Albumin: 3.5 g/dL (ref 3.5–5.0)
Alkaline Phosphatase: 64 U/L (ref 40–150)
Anion Gap: 8 mEq/L (ref 3–11)
BUN: 8.9 mg/dL (ref 7.0–26.0)
CO2: 31 mEq/L — ABNORMAL HIGH (ref 22–29)
Calcium: 10.1 mg/dL (ref 8.4–10.4)
Chloride: 102 mEq/L (ref 98–109)
Creatinine: 0.8 mg/dL (ref 0.6–1.1)
EGFR: 78 mL/min/{1.73_m2} — ABNORMAL LOW (ref >90)
Glucose: 79 mg/dl (ref 70–140)
Potassium: 3.8 mEq/L (ref 3.5–5.1)
Sodium: 141 mEq/L (ref 136–145)
Total Bilirubin: 0.37 mg/dL (ref 0.20–1.20)
Total Protein: 7.1 g/dL (ref 6.4–8.3)

## 2017-06-16 NOTE — Progress Notes (Signed)
REFERRING PROVIDER: [name], MD [address] [place], [place] [address]  PRIMARY PROVIDER:  [name], MD  PRIMARY REASON FOR VISIT:  1. Malignant neoplasm of upper-inner quadrant of left breast in female, estrogen receptor positive ([place])   2. Family history of melanoma   3. Family history of prostate cancer   4. Family history of breast cancer      HISTORY OF PRESENT ILLNESS:   Jessica [name], a 76 y.o. female, was seen for a [place] cancer genetics consultation at the request of Dr. Marin Olp due to a personal and family history of cancer.  Jessica [name] presents to clinic today to discuss the possibility of a hereditary predisposition to cancer, genetic testing, and to further clarify her future cancer risks, as well as potential cancer risks for family members.   In 2018, at the age of 55, Jessica [name] was diagnosed with triple negative cancer of the left breast. This was treated with lumpectomy, chemotherapy, and she will undergo radiation.  She is here because her daughters are concerned about their risk for a BRCA mutation based on the family history.      CANCER HISTORY:   No history exists.     HORMONAL RISK FACTORS:  Menarche was at age 45.  First live birth at age 66.  OCP use for approximately 10+ years.  Ovaries intact: no.  Hysterectomy: yes.  Menopausal status: postmenopausal.  HRT use: 10 years. Colonoscopy: yes; 5 polyps. Mammogram within the last year: yes. Number of breast biopsies: 1. Up to date with pelvic exams:  no. Any excessive radiation exposure in the past:  no  Past Medical History:  Diagnosis Date  . Allergy   . Anemia   . Anxiety   . Asthma    in past, no inhalers now  . Blood transfusion without reported diagnosis   . Breast cancer ([place]) 01/2017   left breast   . Breast cancer of upper-inner quadrant of left female breast ([place]) 03/11/2017  . Cancer ([place]) 01/2017   left breast  . Cataract    bilateral removed   . Chronic kidney disease    kidney stones  . Depression 08/31/2013  . Dyslipidemia, goal LDL below 160 08/31/2013  . Essential hypertension - well controlled 08/31/2013  . Family history of breast cancer   . Family history of melanoma   . Family history of prostate cancer   . GERD (gastroesophageal reflux disease)   . Goals of care, counseling/discussion 03/11/2017  . Hypothyroidism   . Obesity (BMI 30-39.9) 08/31/2013  . Thyroid disease   . Vasovagal near-syncope -rare 08/31/2013    Past Surgical History:  Procedure Laterality Date  . ABDOMINAL HYSTERECTOMY     total  . BREAST BIOPSY Left 01/2017    malignant  . broken fingers    . CATARACT EXTRACTION Bilateral   . COLONOSCOPY    . EXCISIONAL HEMORRHOIDECTOMY    . IRRIGATION AND DEBRIDEMENT ABSCESS Left 05/18/2017   Procedure: IRRIGATION AND DEBRIDEMENT LEFT AXILLARY ABSCESS;  Surgeon: Michael Boston, MD;  Location: [facility];  Service: General;  Laterality: Left;  . kidney stones lithotripsy    . PORTACATH PLACEMENT Right 03/23/2017   Procedure: INSERTION PORT-A-CATH;  Surgeon: Erroll Luna, MD;  Location: [facility];  Service: General;  Laterality: Right;  . RADIOACTIVE SEED GUIDED MASTECTOMY WITH AXILLARY SENTINEL LYMPH NODE BIOPSY Left 03/23/2017   Procedure: LEFT BREAST RADIOACTIVE SEED GUIDED PARTIAL MASTECTOMY AND  SENTINEL LYMPH NODE  MAPPING;  Surgeon: Harriette Bouillon, MD;  Location: [facility];  Service: General;  Laterality: Left;  . ROTATOR CUFF REPAIR     left   . TONSILLECTOMY    . WISDOM TOOTH EXTRACTION      Social History   Social History  . Marital status: Married    Spouse name: N/A  . Number of children: 3  . Years of education: N/A   Occupational History  . retired     Social History Main Topics  . Smoking status: Never Smoker  . Smokeless tobacco: Never Used  . Alcohol use No  . Drug use: No  . Sexual activity: Not Asked   Other Topics Concern  . None    Social History Narrative   Married mother of 3 with one grandchild. Never smoked. Does not drink alcohol.   Walks 3-4 days a week for roughly 20-30 minutes.     FAMILY HISTORY:  We obtained a detailed, 4-generation family history.  Significant diagnoses are listed below: Family History  Problem Relation Age of Onset  . Heart disease Mother        Angina  . Heart disease Father   . Cervical cancer Maternal Aunt   . Heart attack Brother        Died of MI in his mid 53s  . Prostate cancer Brother        dx in his early 7s  . Breast cancer Paternal Aunt        dx over 24  . Bone cancer Cousin        maternal first cousin dx in her 98s-60s  . Lung cancer Cousin        paternal first cousin  . Colon cancer Neg Hx   . Pancreatic cancer Neg Hx   . Stomach cancer Neg Hx   . Esophageal cancer Neg Hx   . Rectal cancer Neg Hx     The patient has three daughters.  One has had a possible melanoma vs BCC/SCC and the other had a melanoma around age 78 on her left arm.  She had one brother who developed prostate cancer in his early 61's.  He died of a heart attack in his mid 68's.  The patient's mother died at 67.  She had a TAH-BSO in her late 20's-30's.  She was one of 11 children.  She had one sister who had cervical cancer and one niece with bone cancer.  Her maternal grandparents died in their 75's from non cancer related issues.  The patient's father died at 56 from a heart attack.  He was one of 13 children.  He had one sister who had breast cancer over 62 and a nephew with lung cancer.  His parents died of old age or complications from Diabetes.  Jessica [name] is unaware of previous family history of genetic testing for hereditary cancer risks. Patient's ancestors are of [place] and [place] descent. There is no reported Ashkenazi Jewish ancestry. There is no known consanguinity.  GENETIC COUNSELING ASSESSMENT: Jessica [name] is a 76 y.o. female with a personal history of breast  cancer and family history of melanoma, breast and prostate cancer which is somewhat suggestive of a hereditary cancer syndrome and predisposition to cancer. We, therefore, discussed and recommended the following at today's visit.   DISCUSSION: We discussed that about 5-10% of breast cancer is hereditary with most cases due to BRCA mutations.  Other genes are also associated with hereditary breast cancer  syndrome, including ATM, CHEK2 and PALB2.  Despite that there are several individuals with cancer in the family that can be associated with BRCA mutations, we discussed with Jessica [name] that the family history is not highly consistent with a familial hereditary cancer syndrome, and we feel she is at low risk to harbor a gene mutation associated with such a condition. Her daughters are still worried about an increased risk for a hereditary breast cancer gene and for peace of mind will undergo genetic testing.  The common hereditary cancer panel and the melanoma panels were ordered.  The Hereditary Gene Panel offered by [facility] includes sequencing and/or deletion duplication testing of the following 46 genes: APC, ATM, AXIN2, BARD1, BMPR1A, BRCA1, BRCA2, BRIP1, CDH1, CDKN2A (p14ARF), CDKN2A (p16INK4a), CHEK2, CTNNA1, DICER1, EPCAM (Deletion/duplication testing only), GREM1 (promoter region deletion/duplication testing only), KIT, MEN1, MLH1, MSH2, MSH3, MSH6, MUTYH, NBN, NF1, NHTL1, PALB2, PDGFRA, PMS2, POLD1, POLE, PTEN, RAD50, RAD51C, RAD51D, SDHB, SDHC, SDHD, SMAD4, SMARCA4. STK11, TP53, TSC1, TSC2, and VHL.  The following genes were evaluated for sequence changes only: SDHA and HOXB13 c.251G>A variant only. The Melanoma panel offered by [facility] includes sequencing and/or deletion duplication testing of the following 12 genes: BAP1, BRCA1, BRCA2, BRIP1, CDK4, CDKN2A (p14ARF), CDKN2A (p16INK4a), MC1R, POT1, PTEN, RB1, TERT, and TP53.  The following gene was evaluated for sequence changes only: MITF (c.952G>A,  p.GLU318Lys variant only).      PLAN: After considering the risks, benefits, and limitations, Jessica [name]  provided informed consent to pursue genetic testing and the blood sample was sent to [facility] for analysis of the common hereditary cancer and melanoma panels. Jessica [name] has asked that we communicate with her daughter Clint Bolder.  Results should be available within approximately 2-3 weeks' time, at which point they will be disclosed by telephone to Jessica [name]'s daughter Jessica [name], as will any additional recommendations warranted by these results. Jessica [name] will receive a summary of her genetic counseling visit and a copy of her results once available. This information will also be available in Epic. We encouraged Jessica [name] to remain in contact with cancer genetics annually so that we can continuously update the family history and inform her of any changes in cancer genetics and testing that may be of benefit for her family. Jessica [name] questions were answered to her satisfaction today. Our contact information was provided should additional questions or concerns arise.  Lastly, we encouraged Jessica [name] to remain in contact with cancer genetics annually so that we can continuously update the family history and inform her of any changes in cancer genetics and testing that may be of benefit for this family.   Ms.  [name] questions were answered to her satisfaction today. Our contact information was provided should additional questions or concerns arise. Thank you for the referral and allowing [place] to share in the care of your patient.   Karen P. Florene Glen, [place], [facility] Certified Genetic Counselor [name] phone: [phone]  The patient was seen for a total of 45 minutes in face-to-face genetic counseling.  This patient was discussed with Drs. Magrinat, Lindi Adie and/or Burr Medico who agrees with the above.     _______________________________________________________________________ For Office Staff:  Number of people involved in session: 2 Was an Intern/ student involved with case: no

## 2017-06-24 DIAGNOSIS — I10 Essential (primary) hypertension: Secondary | ICD-10-CM | POA: Diagnosis not present

## 2017-06-24 DIAGNOSIS — K219 Gastro-esophageal reflux disease without esophagitis: Secondary | ICD-10-CM | POA: Diagnosis not present

## 2017-06-29 ENCOUNTER — Telehealth: Payer: Self-pay | Admitting: Genetic Counselor

## 2017-06-29 ENCOUNTER — Encounter: Payer: Self-pay | Admitting: Genetic Counselor

## 2017-06-29 ENCOUNTER — Ambulatory Visit: Payer: Self-pay | Admitting: Genetic Counselor

## 2017-06-29 DIAGNOSIS — Z1379 Encounter for other screening for genetic and chromosomal anomalies: Secondary | ICD-10-CM

## 2017-06-29 DIAGNOSIS — C50212 Malignant neoplasm of upper-inner quadrant of left female breast: Secondary | ICD-10-CM

## 2017-06-29 DIAGNOSIS — Z170 Estrogen receptor positive status [ER+]: Secondary | ICD-10-CM

## 2017-06-29 DIAGNOSIS — Z8042 Family history of malignant neoplasm of prostate: Secondary | ICD-10-CM

## 2017-06-29 DIAGNOSIS — Z808 Family history of malignant neoplasm of other organs or systems: Secondary | ICD-10-CM

## 2017-06-29 DIAGNOSIS — Z803 Family history of malignant neoplasm of breast: Secondary | ICD-10-CM

## 2017-06-29 NOTE — Progress Notes (Signed)
HPI: [name] was previously seen in the [facility] due to a personal and family history of cancer and concerns regarding a hereditary predisposition to cancer. Please refer to our prior cancer genetics clinic note for more information regarding [name]'s medical, social and family histories, and our assessment and recommendations, at the time. [name] recent genetic test results were disclosed to her, as were recommendations warranted by these results. These results and recommendations are discussed in more detail below.  CANCER HISTORY:    Breast cancer of upper-inner quadrant of left female breast (HCC)   03/11/2017 Initial Diagnosis    Breast cancer of upper-inner quadrant of left female breast (HCC)     06/23/2017 Genetic Testing    MEN1 c.803A>G (p.Tyr268Cys) VUS identified on the Common Hereditary Cancer Panel.  The Hereditary Gene Panel offered by [facility] includes sequencing and/or deletion duplication testing of the following 46 genes: APC, ATM, AXIN2, BARD1, BMPR1A, BRCA1, BRCA2, BRIP1, CDH1, CDKN2A (p14ARF), CDKN2A (p16INK4a), CHEK2, CTNNA1, DICER1, EPCAM (Deletion/duplication testing only), GREM1 (promoter region deletion/duplication testing only), KIT, MEN1, MLH1, MSH2, MSH3, MSH6, MUTYH, NBN, NF1, NHTL1, PALB2, PDGFRA, PMS2, POLD1, POLE, PTEN, RAD50, RAD51C, RAD51D, SDHB, SDHC, SDHD, SMAD4, SMARCA4. STK11, TP53, TSC1, TSC2, and VHL.  The following genes were evaluated for sequence changes only: SDHA and HOXB13 c.251G>A variant only.  The report date is 06/23/2017.       FAMILY HISTORY:  We obtained a detailed, 4-generation family history.  Significant diagnoses are listed below: Family History  Problem Relation Age of Onset  . Heart disease Mother        Angina  . Heart disease Father   . Cervical cancer Maternal Aunt   . Heart attack Brother        Died of MI in his mid 39s  . Prostate cancer Brother        dx in his early 10s  . Breast  cancer Paternal Aunt        dx over 70  . Bone cancer Cousin        maternal first cousin dx in her 30s-60s  . Lung cancer Cousin        paternal first cousin  . Colon cancer Neg Hx   . Pancreatic cancer Neg Hx   . Stomach cancer Neg Hx   . Esophageal cancer Neg Hx   . Rectal cancer Neg Hx     The patient has three daughters.  One has had a possible melanoma vs BCC/SCC and the other had a melanoma around age 55 on her left arm.  She had one brother who developed prostate cancer in his early 10's.  He died of a heart attack in his mid 57's.  The patient's mother died at 69.  She had a TAH-BSO in her late 20's-30's.  She was one of 11 children.  She had one sister who had cervical cancer and one niece with bone cancer.  Her maternal grandparents died in their 18's from non cancer related issues.  The patient's father died at 20 from a heart attack.  He was one of 13 children.  He had one sister who had breast cancer over 54 and a nephew with lung cancer.  His parents died of old age or complications from Diabetes.  [name] is unaware of previous family history of genetic testing for hereditary cancer risks. Patient's ancestors are of [place] and [place] descent. There is no reported Ashkenazi Jewish ancestry. There is no  known consanguinity.  GENETIC TEST RESULTS: Genetic testing reported out on June 23, 2017 through the Common Hereditary cancer panel found no deleterious mutations.  The Hereditary Gene Panel offered by [facility] includes sequencing and/or deletion duplication testing of the following 46 genes: APC, ATM, AXIN2, BARD1, BMPR1A, BRCA1, BRCA2, BRIP1, CDH1, CDKN2A (p14ARF), CDKN2A (p16INK4a), CHEK2, CTNNA1, DICER1, EPCAM (Deletion/duplication testing only), GREM1 (promoter region deletion/duplication testing only), KIT, MEN1, MLH1, MSH2, MSH3, MSH6, MUTYH, NBN, NF1, NHTL1, PALB2, PDGFRA, PMS2, POLD1, POLE, PTEN, RAD50, RAD51C, RAD51D, SDHB, SDHC, SDHD, SMAD4, SMARCA4. STK11, TP53,  TSC1, TSC2, and VHL.  The following genes were evaluated for sequence changes only: SDHA and HOXB13 c.251G>A variant only.  The test report has been scanned into EPIC and is located under the Molecular Pathology section of the Results Review tab.   We discussed with [name] that since the current genetic testing is not perfect, it is possible there may be a gene mutation in one of these genes that current testing cannot detect, but that chance is small. We also discussed, that it is possible that another gene that has not yet been discovered, or that we have not yet tested, is responsible for the cancer diagnoses in the family, and it is, therefore, important to remain in touch with cancer genetics in the future so that we can continue to offer [name] the most up to date genetic testing.     CANCER SCREENING RECOMMENDATIONS:  This result is reassuring and indicates that [name] likely does not have an increased risk for a future cancer due to a mutation in one of these genes. This normal test also suggests that [name]'s cancer was most likely not due to an inherited predisposition associated with one of these genes.  Most cancers happen by chance and this negative test suggests that her cancer falls into this category.  We, therefore, recommended she continue to follow the cancer management and screening guidelines provided by her oncology and primary healthcare provider.   RECOMMENDATIONS FOR FAMILY MEMBERS: Women in this family might be at some increased risk of developing cancer, over the general population risk, simply due to the family history of cancer. We recommended women in this family have a yearly mammogram beginning at age 4, or 41 years younger than the earliest onset of cancer, an annual clinical breast exam, and perform monthly breast self-exams. Women in this family should also have a gynecological exam as recommended by their primary provider. All family members should  have a colonoscopy by age 26.  FOLLOW-UP: Lastly, we discussed with [name] that cancer genetics is a rapidly advancing field and it is possible that new genetic tests will be appropriate for her and/or her family members in the future. We encouraged her to remain in contact with cancer genetics on an annual basis so we can update her personal and family histories and let her know of advances in cancer genetics that may benefit this family.   Our contact number was provided. [name] questions were answered to her satisfaction, and she knows she is welcome to call us at anytime with additional questions or concerns.   Roma Kayser, MS, [facility] Certified Genetic Counselor [name]

## 2017-06-29 NOTE — Telephone Encounter (Signed)
Revealed negative genetic testing.  Discussed that we do not know why her mother has breast cancer or why there is cancer in the family. It could be due to a different gene that we are not testing, or maybe our current technology may not be able to pick something up.  It will be important for her to keep in contact with genetics to keep up with whether additional testing may be needed.  Reported the MEN1 VUS and explained that this is not actionable.  The daughter is very anxious about her mother's cancer.  We discussed her mom's testing, and that since her mother is neg, we would not have anything to test her for.  She is still anxious and is going to consider genetic testing.

## 2017-06-30 ENCOUNTER — Ambulatory Visit: Payer: Medicare Other | Admitting: Hematology & Oncology

## 2017-06-30 ENCOUNTER — Ambulatory Visit: Payer: Medicare Other

## 2017-06-30 ENCOUNTER — Other Ambulatory Visit: Payer: Medicare Other

## 2017-07-01 ENCOUNTER — Telehealth: Payer: Self-pay | Admitting: *Deleted

## 2017-07-01 NOTE — Telephone Encounter (Signed)
Daughter is concerned about patient, as her husband has been admitted to the hospital with a "strep blood infection". She is worried that the patient may have been exposed and may need prophylactic antibiotics.  Reviewed with Dr Marin Olp and since patient is not currently on treatment, he doesn't feel that we need to prophylactically treat.   Daughter aware

## 2017-07-08 ENCOUNTER — Other Ambulatory Visit (HOSPITAL_BASED_OUTPATIENT_CLINIC_OR_DEPARTMENT_OTHER): Payer: Medicare Other

## 2017-07-08 ENCOUNTER — Ambulatory Visit (HOSPITAL_BASED_OUTPATIENT_CLINIC_OR_DEPARTMENT_OTHER): Payer: Medicare Other

## 2017-07-08 ENCOUNTER — Ambulatory Visit (HOSPITAL_BASED_OUTPATIENT_CLINIC_OR_DEPARTMENT_OTHER): Payer: Medicare Other | Admitting: Hematology & Oncology

## 2017-07-08 ENCOUNTER — Ambulatory Visit: Payer: Medicare Other

## 2017-07-08 VITALS — BP 124/68 | HR 92 | Temp 98.6°F | Resp 18 | Wt 165.1 lb

## 2017-07-08 DIAGNOSIS — Z171 Estrogen receptor negative status [ER-]: Secondary | ICD-10-CM

## 2017-07-08 DIAGNOSIS — Z5189 Encounter for other specified aftercare: Secondary | ICD-10-CM

## 2017-07-08 DIAGNOSIS — C50212 Malignant neoplasm of upper-inner quadrant of left female breast: Secondary | ICD-10-CM | POA: Diagnosis not present

## 2017-07-08 DIAGNOSIS — Z5111 Encounter for antineoplastic chemotherapy: Secondary | ICD-10-CM | POA: Diagnosis not present

## 2017-07-08 LAB — CMP (CANCER CENTER ONLY)
ALT(SGPT): 18 U/L (ref 10–47)
AST: 29 U/L (ref 11–38)
Albumin: 3.4 g/dL (ref 3.3–5.5)
Alkaline Phosphatase: 55 U/L (ref 26–84)
BUN, Bld: 12 mg/dL (ref 7–22)
CO2: 28 mEq/L (ref 18–33)
Calcium: 9.9 mg/dL (ref 8.0–10.3)
Chloride: 99 mEq/L (ref 98–108)
Creat: 0.7 mg/dl (ref 0.6–1.2)
Glucose, Bld: 169 mg/dL — ABNORMAL HIGH (ref 73–118)
Potassium: 3.4 mEq/L (ref 3.3–4.7)
Sodium: 137 mEq/L (ref 128–145)
Total Bilirubin: 0.5 mg/dl (ref 0.20–1.60)
Total Protein: 7.1 g/dL (ref 6.4–8.1)

## 2017-07-08 LAB — CBC WITH DIFFERENTIAL (CANCER CENTER ONLY)
BASO#: 0 10*3/uL (ref 0.0–0.2)
BASO%: 0.2 % (ref 0.0–2.0)
EOS%: 0 % (ref 0.0–7.0)
Eosinophils Absolute: 0 10*3/uL (ref 0.0–0.5)
HCT: 35.7 % (ref 34.8–46.6)
HGB: 11.7 g/dL (ref 11.6–15.9)
LYMPH#: 0.6 10*3/uL — ABNORMAL LOW (ref 0.9–3.3)
LYMPH%: 9.9 % — ABNORMAL LOW (ref 14.0–48.0)
MCH: 32.8 pg (ref 26.0–34.0)
MCHC: 32.8 g/dL (ref 32.0–36.0)
MCV: 100 fL (ref 81–101)
MONO#: 0.2 10*3/uL (ref 0.1–0.9)
MONO%: 2.8 % (ref 0.0–13.0)
NEUT#: 5.6 10*3/uL (ref 1.5–6.5)
NEUT%: 87.1 % — ABNORMAL HIGH (ref 39.6–80.0)
Platelets: 260 10*3/uL (ref 145–400)
RBC: 3.57 10*6/uL — ABNORMAL LOW (ref 3.70–5.32)
RDW: 14.8 % (ref 11.1–15.7)
WBC: 6.5 10*3/uL (ref 3.9–10.0)

## 2017-07-08 MED ORDER — DEXAMETHASONE SODIUM PHOSPHATE 10 MG/ML IJ SOLN
10.0000 mg | Freq: Once | INTRAMUSCULAR | Status: AC
  Administered 2017-07-08: 10 mg via INTRAVENOUS

## 2017-07-08 MED ORDER — DEXAMETHASONE SODIUM PHOSPHATE 10 MG/ML IJ SOLN
INTRAMUSCULAR | Status: AC
  Filled 2017-07-08: qty 1

## 2017-07-08 MED ORDER — PEGFILGRASTIM 6 MG/0.6ML ~~LOC~~ PSKT
PREFILLED_SYRINGE | SUBCUTANEOUS | Status: AC
  Filled 2017-07-08: qty 0.6

## 2017-07-08 MED ORDER — ALTEPLASE 2 MG IJ SOLR
2.0000 mg | Freq: Once | INTRAMUSCULAR | Status: DC | PRN
  Filled 2017-07-08: qty 2

## 2017-07-08 MED ORDER — SODIUM CHLORIDE 0.9% FLUSH
10.0000 mL | INTRAVENOUS | Status: DC | PRN
  Administered 2017-07-08: 10 mL
  Filled 2017-07-08: qty 10

## 2017-07-08 MED ORDER — PEGFILGRASTIM 6 MG/0.6ML ~~LOC~~ PSKT
6.0000 mg | PREFILLED_SYRINGE | Freq: Once | SUBCUTANEOUS | Status: AC
  Administered 2017-07-08: 6 mg via SUBCUTANEOUS

## 2017-07-08 MED ORDER — HEPARIN SOD (PORK) LOCK FLUSH 100 UNIT/ML IV SOLN
500.0000 [IU] | Freq: Once | INTRAVENOUS | Status: AC | PRN
  Administered 2017-07-08: 500 [IU]
  Filled 2017-07-08: qty 5

## 2017-07-08 MED ORDER — PALONOSETRON HCL INJECTION 0.25 MG/5ML
INTRAVENOUS | Status: AC
  Filled 2017-07-08: qty 5

## 2017-07-08 MED ORDER — PALONOSETRON HCL INJECTION 0.25 MG/5ML
0.2500 mg | Freq: Once | INTRAVENOUS | Status: AC
  Administered 2017-07-08: 0.25 mg via INTRAVENOUS

## 2017-07-08 MED ORDER — SODIUM CHLORIDE 0.9 % IV SOLN
400.0000 mg | Freq: Once | INTRAVENOUS | Status: AC
  Administered 2017-07-08: 400 mg via INTRAVENOUS
  Filled 2017-07-08: qty 40

## 2017-07-08 MED ORDER — HEPARIN SOD (PORK) LOCK FLUSH 100 UNIT/ML IV SOLN
250.0000 [IU] | Freq: Once | INTRAVENOUS | Status: DC | PRN
  Filled 2017-07-08: qty 5

## 2017-07-08 MED ORDER — DOCETAXEL CHEMO INJECTION 160 MG/16ML
67.5000 mg/m2 | Freq: Once | INTRAVENOUS | Status: AC
  Administered 2017-07-08: 120 mg via INTRAVENOUS
  Filled 2017-07-08: qty 12

## 2017-07-08 MED ORDER — SODIUM CHLORIDE 0.9% FLUSH
3.0000 mL | INTRAVENOUS | Status: DC | PRN
  Filled 2017-07-08: qty 10

## 2017-07-08 MED ORDER — SODIUM CHLORIDE 0.9 % IV SOLN
Freq: Once | INTRAVENOUS | Status: AC
  Administered 2017-07-08: 14:00:00 via INTRAVENOUS

## 2017-07-08 NOTE — Progress Notes (Signed)
Hematology and Oncology Follow Up Visit  Jessica [name] [id number] 1941-01-22 76 y.o. 07/08/2017   Principle Diagnosis:   Stage IIA (T2bN0M0) invasive ductal ca of the LEFT breast - TRIPLE NEGATIVE  Staphylococcus aureus wound infection of the LEFT axilla  Current Therapy:    Taxotere/Carboplatin - s/p cycle #1 on 04/28/2017     Interim History:  Jessica [name] is back for follow-up. she looks much better. I think we are not ready to start treatment again.  There's been no problems with fever. She's had no fatigue. She's had no cough. She's had no nausea or vomiting.  She's had no leg swelling. There's been no rashes.   Unfortunately, her husband has not been doing too well. He had strep bacteremia. He now will be going to a nursing home.   Thankfully, her appetite is doing pretty well.   Currently, her performance status is ECOG 1.   Medications:  Current Outpatient Prescriptions:  .  calcium citrate-vitamin D (CITRACAL+D) 315-200 MG-UNIT per tablet, Take 1 tablet by mouth daily., Disp: , Rfl:  .  diphenhydramine-acetaminophen (TYLENOL PM) 25-500 MG TABS, Take 1 tablet by mouth at bedtime as needed (sleep). , Disp: , Rfl:  .  hydrochlorothiazide (MICROZIDE) 12.5 MG capsule, Take 12.5 mg by mouth daily., Disp: , Rfl: 1 .  levothyroxine (SYNTHROID, LEVOTHROID) 75 MCG tablet, Take 75 mcg by mouth daily., Disp: , Rfl:  .  lidocaine-prilocaine (EMLA) cream, Apply 1 application topically as needed., Disp: 30 g, Rfl: 6 .  LORazepam (ATIVAN) 0.5 MG tablet, Take 1 tablet (0.5 mg total) by mouth every 6 (six) hours as needed (Nausea or vomiting)., Disp: 30 tablet, Rfl: 0 .  Lysine 1000 MG TABS, Take 1,000 mg by mouth as needed., Disp: , Rfl:  .  Multiple Vitamin (MULTIVITAMIN) tablet, Take 1 tablet by mouth daily., Disp: , Rfl:  .  omeprazole (PRILOSEC OTC) 20 MG tablet, Take 20 mg by mouth daily., Disp: , Rfl:  .  ondansetron (ZOFRAN) 8 MG tablet, Take 1 tablet (8 mg total) by mouth 2  (two) times daily as needed for refractory nausea / vomiting. Start on day 3 after chemo., Disp: 30 tablet, Rfl: 1 .  OVER THE COUNTER MEDICATION, Take 15 mLs by mouth 2 (two) times daily as needed (pain)., Disp: , Rfl:  .  polyvinyl alcohol (LIQUIFILM TEARS) 1.4 % ophthalmic solution, 1 drop as needed for dry eyes., Disp: , Rfl:  .  prochlorperazine (COMPAZINE) 10 MG tablet, TAKE 1 TABLET(10 MG) BY MOUTH EVERY 6 HOURS AS NEEDED FOR NAUSEA OR VOMITING, Disp: 385 tablet, Rfl: 1 .  venlafaxine XR (EFFEXOR-XR) 75 MG 24 hr capsule, Take 225 mg by mouth daily., Disp: , Rfl:   Allergies:  Allergies  Allergen Reactions  . Codeine Hives  . Penicillins Hives    Has patient had a PCN reaction causing immediate rash, facial/tongue/throat swelling, SOB or lightheadedness with hypotension: yes Has patient had a PCN reaction causing severe rash involving mucus membranes or skin necrosis:  no Has patient had a PCN reaction that required hospitalization:  no Has patient had a PCN reaction occurring within the last 10 years: no If all of the above answers are "NO", then may proceed with Cephalosporin use.   . Advil [Ibuprofen] Hives and Itching  . Band-Aid Plus Antibiotic [Bacitracin-Polymyxin B] Dermatitis  . Benazepril Swelling    Possible cause of tongue swelling    Past Medical History, Surgical history, Social history, and Family History were reviewed and  updated.  Review of Systems:  As above  Physical Exam:  weight is 165 lb 1.9 oz (74.9 kg). Her oral temperature is 98.6 F (37 C). Her blood pressure is 124/68 and her pulse is 92. Her respiration is 18 and oxygen saturation is 97%.   Wt Readings from Last 3 Encounters:  07/08/17 165 lb 1.9 oz (74.9 kg)  06/09/17 166 lb (75.3 kg)  05/17/17 156 lb 8.4 oz (71 kg)      Head and neck exam shows no ocular or oral lesions. There are no palpable cervical or supraclavicular lymph nodes. Lungs are clear. Cardiac exam regular rate and rhythm with  no murmurs, rubs or bruits. Breast exam shows right breast with no masses, edema or erythema. There is no right axillary adenopathy. Left breast shows the well healing lumpectomy scar at the 12:00 position. This is at the age of the areola. There is some slight erythema. There is some slight firmness. The lumpectomy scar is well-healing. There is no swelling in the left axilla. She does have the packing in the cavity in the left axilla. Abdomen is soft. She has good bowel sounds. There is no palpable liver or spleen tip. Back exam shows no tenderness over the spine, ribs or hips. Extremities shows no clubbing, cyanosis or edema. Neurological exam shows no focal neurological deficits. Skin exam shows no rashes, ecchymoses or petechia.  Lab Results  Component Value Date   WBC 6.5 07/08/2017   HGB 11.7 07/08/2017   HCT 35.7 07/08/2017   MCV 100 07/08/2017   PLT 260 07/08/2017     Chemistry      Component Value Date/Time   NA 137 07/08/2017 1141   NA 141 06/16/2017 1014   K 3.4 07/08/2017 1141   K 3.8 06/16/2017 1014   CL 99 07/08/2017 1141   CO2 28 07/08/2017 1141   CO2 31 (H) 06/16/2017 1014   BUN 12 07/08/2017 1141   BUN 8.9 06/16/2017 1014   CREATININE 0.7 07/08/2017 1141   CREATININE 0.8 06/16/2017 1014      Component Value Date/Time   CALCIUM 9.9 07/08/2017 1141   CALCIUM 10.1 06/16/2017 1014   ALKPHOS 55 07/08/2017 1141   ALKPHOS 64 06/16/2017 1014   AST 29 07/08/2017 1141   AST 21 06/16/2017 1014   ALT 18 07/08/2017 1141   ALT 20 06/16/2017 1014   BILITOT 0.50 07/08/2017 1141   BILITOT 0.37 06/16/2017 1014         Impression and Plan: Jessica [name] is a 76 year old postmenopausal white female with a triple negative stage IIA ductal carcinoma of the left breast.  We finally are now ready to restart her treatments. I will adjust the dose of Taxotere a little bit.  I feel that we will still be able to go with 4 cycles of treatment.  We will plan to get her back in 3  more weeks.   [name], MD 9/6/20181:32 PM

## 2017-07-08 NOTE — Patient Instructions (Signed)
Implanted Port Home Guide An implanted port is a type of central line that is placed under the skin. Central lines are used to provide IV access when treatment or nutrition needs to be given through a person's veins. Implanted ports are used for long-term IV access. An implanted port may be placed because:  You need IV medicine that would be irritating to the small veins in your hands or arms.  You need long-term IV medicines, such as antibiotics.  You need IV nutrition for a long period.  You need frequent blood draws for lab tests.  You need dialysis.  Implanted ports are usually placed in the chest area, but they can also be placed in the upper arm, the abdomen, or the leg. An implanted port has two main parts:  Reservoir. The reservoir is round and will appear as a small, raised area under your skin. The reservoir is the part where a needle is inserted to give medicines or draw blood.  Catheter. The catheter is a thin, flexible tube that extends from the reservoir. The catheter is placed into a large vein. Medicine that is inserted into the reservoir goes into the catheter and then into the vein.  How will I care for my incision site? Do not get the incision site wet. Bathe or shower as directed by your health care provider. How is my port accessed? Special steps must be taken to access the port:  Before the port is accessed, a numbing cream can be placed on the skin. This helps numb the skin over the port site.  Your health care provider uses a sterile technique to access the port. ? Your health care provider must put on a mask and sterile gloves. ? The skin over your port is cleaned carefully with an antiseptic and allowed to dry. ? The port is gently pinched between sterile gloves, and a needle is inserted into the port.  Only "non-coring" port needles should be used to access the port. Once the port is accessed, a blood return should be checked. This helps ensure that the port  is in the vein and is not clogged.  If your port needs to remain accessed for a constant infusion, a clear (transparent) bandage will be placed over the needle site. The bandage and needle will need to be changed every week, or as directed by your health care provider.  Keep the bandage covering the needle clean and dry. Do not get it wet. Follow your health care provider's instructions on how to take a shower or bath while the port is accessed.  If your port does not need to stay accessed, no bandage is needed over the port.  What is flushing? Flushing helps keep the port from getting clogged. Follow your health care provider's instructions on how and when to flush the port. Ports are usually flushed with saline solution or a medicine called heparin. The need for flushing will depend on how the port is used.  If the port is used for intermittent medicines or blood draws, the port will need to be flushed: ? After medicines have been given. ? After blood has been drawn. ? As part of routine maintenance.  If a constant infusion is running, the port may not need to be flushed.  How long will my port stay implanted? The port can stay in for as long as your health care provider thinks it is needed. When it is time for the port to come out, surgery will be   done to remove it. The procedure is similar to the one performed when the port was put in. When should I seek immediate medical care? When you have an implanted port, you should seek immediate medical care if:  You notice a bad smell coming from the incision site.  You have swelling, redness, or drainage at the incision site.  You have more swelling or pain at the port site or the surrounding area.  You have a fever that is not controlled with medicine.  This information is not intended to replace advice given to you by your health care provider. Make sure you discuss any questions you have with your health care provider. Document  Released: 10/19/2005 Document Revised: 03/26/2016 Document Reviewed: 06/26/2013 Elsevier Interactive Patient Education  2017 Elsevier Inc[employer]  

## 2017-07-08 NOTE — Patient Instructions (Signed)
Carboplatin injection What is this medicine? CARBOPLATIN (KAR boe pla tin) is a chemotherapy drug. It targets fast dividing cells, like cancer cells, and causes these cells to die. This medicine is used to treat ovarian cancer and many other cancers. This medicine may be used for other purposes; ask your health care provider or pharmacist if you have questions. COMMON BRAND NAME(S): Paraplatin What should I tell my health care provider before I take this medicine? They need to know if you have any of these conditions: -blood disorders -hearing problems -kidney disease -recent or ongoing radiation therapy -an unusual or allergic reaction to carboplatin, cisplatin, other chemotherapy, other medicines, foods, dyes, or preservatives -pregnant or trying to get pregnant -breast-feeding How should I use this medicine? This drug is usually given as an infusion into a vein. It is administered in a hospital or clinic by a specially trained health care professional. Talk to your pediatrician regarding the use of this medicine in children. Special care may be needed. Overdosage: If you think you have taken too much of this medicine contact a poison control center or emergency room at once. NOTE: This medicine is only for you. Do not share this medicine with others. What if I miss a dose? It is important not to miss a dose. Call your doctor or health care professional if you are unable to keep an appointment. What may interact with this medicine? -medicines for seizures -medicines to increase blood counts like filgrastim, pegfilgrastim, sargramostim -some antibiotics like amikacin, gentamicin, neomycin, streptomycin, tobramycin -vaccines Talk to your doctor or health care professional before taking any of these medicines: -acetaminophen -aspirin -ibuprofen -ketoprofen -naproxen This list may not describe all possible interactions. Give your health care provider a list of all the medicines, herbs,  non-prescription drugs, or dietary supplements you use. Also tell them if you smoke, drink alcohol, or use illegal drugs. Some items may interact with your medicine. What should I watch for while using this medicine? Your condition will be monitored carefully while you are receiving this medicine. You will need important blood work done while you are taking this medicine. This drug may make you feel generally unwell. This is not uncommon, as chemotherapy can affect healthy cells as well as cancer cells. Report any side effects. Continue your course of treatment even though you feel ill unless your doctor tells you to stop. In some cases, you may be given additional medicines to help with side effects. Follow all directions for their use. Call your doctor or health care professional for advice if you get a fever, chills or sore throat, or other symptoms of a cold or flu. Do not treat yourself. This drug decreases your body's ability to fight infections. Try to avoid being around people who are sick. This medicine may increase your risk to bruise or bleed. Call your doctor or health care professional if you notice any unusual bleeding. Be careful brushing and flossing your teeth or using a toothpick because you may get an infection or bleed more easily. If you have any dental work done, tell your dentist you are receiving this medicine. Avoid taking products that contain aspirin, acetaminophen, ibuprofen, naproxen, or ketoprofen unless instructed by your doctor. These medicines may hide a fever. Do not become pregnant while taking this medicine. Women should inform their doctor if they wish to become pregnant or think they might be pregnant. There is a potential for serious side effects to an unborn child. Talk to your health care professional or  pharmacist for more information. Do not breast-feed an infant while taking this medicine. What side effects may I notice from receiving this medicine? Side effects  that you should report to your doctor or health care professional as soon as possible: -allergic reactions like skin rash, itching or hives, swelling of the face, lips, or tongue -signs of infection - fever or chills, cough, sore throat, pain or difficulty passing urine -signs of decreased platelets or bleeding - bruising, pinpoint red spots on the skin, black, tarry stools, nosebleeds -signs of decreased red blood cells - unusually weak or tired, fainting spells, lightheadedness -breathing problems -changes in hearing -changes in vision -chest pain -high blood pressure -low blood counts - This drug may decrease the number of white blood cells, red blood cells and platelets. You may be at increased risk for infections and bleeding. -nausea and vomiting -pain, swelling, redness or irritation at the injection site -pain, tingling, numbness in the hands or feet -problems with balance, talking, walking -trouble passing urine or change in the amount of urine Side effects that usually do not require medical attention (report to your doctor or health care professional if they continue or are bothersome): -hair loss -loss of appetite -metallic taste in the mouth or changes in taste This list may not describe all possible side effects. Call your doctor for medical advice about side effects. You may report side effects to FDA at 1-800-FDA-1088. Where should I keep my medicine? This drug is given in a hospital or clinic and will not be stored at home. NOTE: This sheet is a summary. It may not cover all possible information. If you have questions about this medicine, talk to your doctor, pharmacist, or health care provider.  2018 Elsevier/Gold Standard (2008-01-24 14:38:05) Docetaxel injection What is this medicine? DOCETAXEL (doe se TAX el) is a chemotherapy drug. It targets fast dividing cells, like cancer cells, and causes these cells to die. This medicine is used to treat many types of cancers  like breast cancer, certain stomach cancers, head and neck cancer, lung cancer, and prostate cancer. This medicine may be used for other purposes; ask your health care provider or pharmacist if you have questions. COMMON BRAND NAME(S): Docefrez, Taxotere What should I tell my health care provider before I take this medicine? They need to know if you have any of these conditions: -infection (especially a virus infection such as chickenpox, cold sores, or herpes) -liver disease -low blood counts, like low white cell, platelet, or red cell counts -an unusual or allergic reaction to docetaxel, polysorbate 80, other chemotherapy agents, other medicines, foods, dyes, or preservatives -pregnant or trying to get pregnant -breast-feeding How should I use this medicine? This drug is given as an infusion into a vein. It is administered in a hospital or clinic by a specially trained health care professional. Talk to your pediatrician regarding the use of this medicine in children. Special care may be needed. Overdosage: If you think you have taken too much of this medicine contact a poison control center or emergency room at once. NOTE: This medicine is only for you. Do not share this medicine with others. What if I miss a dose? It is important not to miss your dose. Call your doctor or health care professional if you are unable to keep an appointment. What may interact with this medicine? -cyclosporine -erythromycin -ketoconazole -medicines to increase blood counts like filgrastim, pegfilgrastim, sargramostim -vaccines Talk to your doctor or health care professional before taking any of these  medicines: -acetaminophen -aspirin -ibuprofen -ketoprofen -naproxen This list may not describe all possible interactions. Give your health care provider a list of all the medicines, herbs, non-prescription drugs, or dietary supplements you use. Also tell them if you smoke, drink alcohol, or use illegal drugs.  Some items may interact with your medicine. What should I watch for while using this medicine? Your condition will be monitored carefully while you are receiving this medicine. You will need important blood work done while you are taking this medicine. This drug may make you feel generally unwell. This is not uncommon, as chemotherapy can affect healthy cells as well as cancer cells. Report any side effects. Continue your course of treatment even though you feel ill unless your doctor tells you to stop. In some cases, you may be given additional medicines to help with side effects. Follow all directions for their use. Call your doctor or health care professional for advice if you get a fever, chills or sore throat, or other symptoms of a cold or flu. Do not treat yourself. This drug decreases your body's ability to fight infections. Try to avoid being around people who are sick. This medicine may increase your risk to bruise or bleed. Call your doctor or health care professional if you notice any unusual bleeding. This medicine may contain alcohol in the product. You may get drowsy or dizzy. Do not drive, use machinery, or do anything that needs mental alertness until you know how this medicine affects you. Do not stand or sit up quickly, especially if you are an older patient. This reduces the risk of dizzy or fainting spells. Avoid alcoholic drinks. Do not become pregnant while taking this medicine. Women should inform their doctor if they wish to become pregnant or think they might be pregnant. There is a potential for serious side effects to an unborn child. Talk to your health care professional or pharmacist for more information. Do not breast-feed an infant while taking this medicine. What side effects may I notice from receiving this medicine? Side effects that you should report to your doctor or health care professional as soon as possible: -allergic reactions like skin rash, itching or hives,  swelling of the face, lips, or tongue -low blood counts - This drug may decrease the number of white blood cells, red blood cells and platelets. You may be at increased risk for infections and bleeding. -signs of infection - fever or chills, cough, sore throat, pain or difficulty passing urine -signs of decreased platelets or bleeding - bruising, pinpoint red spots on the skin, black, tarry stools, nosebleeds -signs of decreased red blood cells - unusually weak or tired, fainting spells, lightheadedness -breathing problems -fast or irregular heartbeat -low blood pressure -mouth sores -nausea and vomiting -pain, swelling, redness or irritation at the injection site -pain, tingling, numbness in the hands or feet -swelling of the ankle, feet, hands -weight gain Side effects that usually do not require medical attention (report to your doctor or health care professional if they continue or are bothersome): -bone pain -complete hair loss including hair on your head, underarms, pubic hair, eyebrows, and eyelashes -diarrhea -excessive tearing -changes in the color of fingernails -loosening of the fingernails -nausea -muscle pain -red flush to skin -sweating -weak or tired This list may not describe all possible side effects. Call your doctor for medical advice about side effects. You may report side effects to FDA at 1-800-FDA-1088. Where should I keep my medicine? This drug is given in a hospital  or clinic and will not be stored at home. NOTE: This sheet is a summary. It may not cover all possible information. If you have questions about this medicine, talk to your doctor, pharmacist, or health care provider.  2018 Elsevier/Gold Standard (2015-11-21 12:32:56)

## 2017-07-09 ENCOUNTER — Telehealth: Payer: Self-pay | Admitting: *Deleted

## 2017-07-09 NOTE — Telephone Encounter (Signed)
Daughter notifying office that patient is experiencing a generalized flushing to her skin. She has redness to her face, trunk and arms. It's not raised, no bumps, no whelps, not itching. Patient has no fevers. No other complaints.  Spoke with Dr Marin Olp and he believes this is likely a response to her steroid pre-meds from yesterday.  Spoke with daughter. She understands. Also reviewed with her that if patient develops any symptoms of itching, wheezing or other symptoms to suggest allergic reaction, she should seek emergency assessment, and to call the office if she develops other symptoms such as fever.

## 2017-07-13 ENCOUNTER — Telehealth: Payer: Self-pay | Admitting: *Deleted

## 2017-07-13 NOTE — Telephone Encounter (Signed)
Patient's daughter notifying office of following symptoms:  1. Transient nausea after treatment last week 2. Low grade temp of 99.7 last pm. Today temp is fine 3. Constipation. No BM since last Tuesday/Wednesday  Reviewed with Dr Marin Olp. 1. Encourage patient to take her prn nausea medication.  2. Continue to monitor temp. Take tylenol prn for temp, call the office back for temp greater than 100.5. 3. Start patient on miralax BID. Continue until BM then reduce to daily.   Reviewed all instructions with patient's daughter [name]. Teaching confirmed with teach back.

## 2017-07-26 ENCOUNTER — Other Ambulatory Visit: Payer: Self-pay

## 2017-07-26 NOTE — Patient Outreach (Signed)
[facility]) Care Management  07/26/2017  Jessica [name] [birth year] [id number]   Medication Adherence call to Jessica [name] the reason for this call is because [name] is showing past due under [facility] Ins.on Benazepril 20 mg spoke to patient she said she is no longer on this medication she had a bad reaction to the medication and was taken off the medication and switth  to Hztc 12.5.    [place] [facility] Direct Dial [phone]  Fax [phone] [email or web address]

## 2017-07-29 ENCOUNTER — Ambulatory Visit (HOSPITAL_BASED_OUTPATIENT_CLINIC_OR_DEPARTMENT_OTHER): Payer: Medicare Other | Admitting: Hematology & Oncology

## 2017-07-29 ENCOUNTER — Ambulatory Visit (HOSPITAL_BASED_OUTPATIENT_CLINIC_OR_DEPARTMENT_OTHER): Payer: Medicare Other

## 2017-07-29 ENCOUNTER — Ambulatory Visit: Payer: Medicare Other

## 2017-07-29 ENCOUNTER — Other Ambulatory Visit (HOSPITAL_BASED_OUTPATIENT_CLINIC_OR_DEPARTMENT_OTHER): Payer: Medicare Other

## 2017-07-29 VITALS — BP 143/52 | HR 81 | Temp 98.5°F | Resp 18 | Wt 163.0 lb

## 2017-07-29 DIAGNOSIS — C50212 Malignant neoplasm of upper-inner quadrant of left female breast: Secondary | ICD-10-CM

## 2017-07-29 DIAGNOSIS — Z171 Estrogen receptor negative status [ER-]: Secondary | ICD-10-CM

## 2017-07-29 DIAGNOSIS — Z5189 Encounter for other specified aftercare: Secondary | ICD-10-CM | POA: Diagnosis not present

## 2017-07-29 DIAGNOSIS — Z170 Estrogen receptor positive status [ER+]: Secondary | ICD-10-CM

## 2017-07-29 DIAGNOSIS — Z95828 Presence of other vascular implants and grafts: Secondary | ICD-10-CM

## 2017-07-29 LAB — CBC WITH DIFFERENTIAL (CANCER CENTER ONLY)
BASO#: 0 10*3/uL (ref 0.0–0.2)
BASO%: 0.1 % (ref 0.0–2.0)
EOS%: 0 % (ref 0.0–7.0)
Eosinophils Absolute: 0 10*3/uL (ref 0.0–0.5)
HCT: 33 % — ABNORMAL LOW (ref 34.8–46.6)
HGB: 10.9 g/dL — ABNORMAL LOW (ref 11.6–15.9)
LYMPH#: 0.5 10*3/uL — ABNORMAL LOW (ref 0.9–3.3)
LYMPH%: 6 % — ABNORMAL LOW (ref 14.0–48.0)
MCH: 33.4 pg (ref 26.0–34.0)
MCHC: 33 g/dL (ref 32.0–36.0)
MCV: 101 fL (ref 81–101)
MONO#: 0.2 10*3/uL (ref 0.1–0.9)
MONO%: 2.3 % (ref 0.0–13.0)
NEUT#: 6.8 10*3/uL — ABNORMAL HIGH (ref 1.5–6.5)
NEUT%: 91.6 % — ABNORMAL HIGH (ref 39.6–80.0)
Platelets: 382 10*3/uL (ref 145–400)
RBC: 3.26 10*6/uL — ABNORMAL LOW (ref 3.70–5.32)
RDW: 15.8 % — ABNORMAL HIGH (ref 11.1–15.7)
WBC: 7.5 10*3/uL (ref 3.9–10.0)

## 2017-07-29 LAB — CMP (CANCER CENTER ONLY)
ALT(SGPT): 18 U/L (ref 10–47)
AST: 20 U/L (ref 11–38)
Albumin: 3.6 g/dL (ref 3.3–5.5)
Alkaline Phosphatase: 61 U/L (ref 26–84)
BUN, Bld: 11 mg/dL (ref 7–22)
CO2: 29 mEq/L (ref 18–33)
Calcium: 9.9 mg/dL (ref 8.0–10.3)
Chloride: 102 mEq/L (ref 98–108)
Creat: 0.6 mg/dl (ref 0.6–1.2)
Glucose, Bld: 117 mg/dL (ref 73–118)
Potassium: 4.2 mEq/L (ref 3.3–4.7)
Sodium: 141 mEq/L (ref 128–145)
Total Bilirubin: 0.6 mg/dl (ref 0.20–1.60)
Total Protein: 7.2 g/dL (ref 6.4–8.1)

## 2017-07-29 MED ORDER — SODIUM CHLORIDE 0.9% FLUSH
10.0000 mL | Freq: Once | INTRAVENOUS | Status: AC
  Administered 2017-07-29: 10 mL
  Filled 2017-07-29: qty 10

## 2017-07-29 MED ORDER — ALTEPLASE 2 MG IJ SOLR
2.0000 mg | Freq: Once | INTRAMUSCULAR | Status: AC | PRN
  Administered 2017-07-29: 2 mg
  Filled 2017-07-29: qty 2

## 2017-07-29 NOTE — Patient Instructions (Signed)
Implanted Port Home Guide An implanted port is a type of central line that is placed under the skin. Central lines are used to provide IV access when treatment or nutrition needs to be given through a person's veins. Implanted ports are used for long-term IV access. An implanted port may be placed because:  You need IV medicine that would be irritating to the small veins in your hands or arms.  You need long-term IV medicines, such as antibiotics.  You need IV nutrition for a long period.  You need frequent blood draws for lab tests.  You need dialysis.  Implanted ports are usually placed in the chest area, but they can also be placed in the upper arm, the abdomen, or the leg. An implanted port has two main parts:  Reservoir. The reservoir is round and will appear as a small, raised area under your skin. The reservoir is the part where a needle is inserted to give medicines or draw blood.  Catheter. The catheter is a thin, flexible tube that extends from the reservoir. The catheter is placed into a large vein. Medicine that is inserted into the reservoir goes into the catheter and then into the vein.  How will I care for my incision site? Do not get the incision site wet. Bathe or shower as directed by your health care provider. How is my port accessed? Special steps must be taken to access the port:  Before the port is accessed, a numbing cream can be placed on the skin. This helps numb the skin over the port site.  Your health care provider uses a sterile technique to access the port. ? Your health care provider must put on a mask and sterile gloves. ? The skin over your port is cleaned carefully with an antiseptic and allowed to dry. ? The port is gently pinched between sterile gloves, and a needle is inserted into the port.  Only "non-coring" port needles should be used to access the port. Once the port is accessed, a blood return should be checked. This helps ensure that the port  is in the vein and is not clogged.  If your port needs to remain accessed for a constant infusion, a clear (transparent) bandage will be placed over the needle site. The bandage and needle will need to be changed every week, or as directed by your health care provider.  Keep the bandage covering the needle clean and dry. Do not get it wet. Follow your health care provider's instructions on how to take a shower or bath while the port is accessed.  If your port does not need to stay accessed, no bandage is needed over the port.  What is flushing? Flushing helps keep the port from getting clogged. Follow your health care provider's instructions on how and when to flush the port. Ports are usually flushed with saline solution or a medicine called heparin. The need for flushing will depend on how the port is used.  If the port is used for intermittent medicines or blood draws, the port will need to be flushed: ? After medicines have been given. ? After blood has been drawn. ? As part of routine maintenance.  If a constant infusion is running, the port may not need to be flushed.  How long will my port stay implanted? The port can stay in for as long as your health care provider thinks it is needed. When it is time for the port to come out, surgery will be   done to remove it. The procedure is similar to the one performed when the port was put in. When should I seek immediate medical care? When you have an implanted port, you should seek immediate medical care if:  You notice a bad smell coming from the incision site.  You have swelling, redness, or drainage at the incision site.  You have more swelling or pain at the port site or the surrounding area.  You have a fever that is not controlled with medicine.  This information is not intended to replace advice given to you by your health care provider. Make sure you discuss any questions you have with your health care provider. Document  Released: 10/19/2005 Document Revised: 03/26/2016 Document Reviewed: 06/26/2013 Elsevier Interactive Patient Education  2017 Elsevier Inc[employer]  

## 2017-07-29 NOTE — Patient Instructions (Signed)
Port was accessed twice today, gave blood return the first time then past a clot and would not flush or give any more blood return. It was then accessed for the second time and flushed saline but no blood return.  Tpa adminstered and indwelled for 2 hours with NO blood return.    Implanted [facility] Guide An implanted port is a type of central line that is placed under the skin. Central lines are used to provide IV access when treatment or nutrition needs to be given through a person's veins. Implanted ports are used for long-term IV access. An implanted port may be placed because:  You need IV medicine that would be irritating to the small veins in your hands or arms.  You need long-term IV medicines, such as antibiotics.  You need IV nutrition for a long period.  You need frequent blood draws for lab tests.  You need dialysis.  Implanted ports are usually placed in the chest area, but they can also be placed in the upper arm, the abdomen, or the leg. An implanted port has two main parts:  Reservoir. The reservoir is round and will appear as a small, raised area under your skin. The reservoir is the part where a needle is inserted to give medicines or draw blood.  Catheter. The catheter is a thin, flexible tube that extends from the reservoir. The catheter is placed into a large vein. Medicine that is inserted into the reservoir goes into the catheter and then into the vein.  How will I care for my incision site? Do not get the incision site wet. Bathe or shower as directed by your health care provider. How is my port accessed? Special steps must be taken to access the port:  Before the port is accessed, a numbing cream can be placed on the skin. This helps numb the skin over the port site.  Your health care provider uses a sterile technique to access the port. ? Your health care provider must put on a mask and sterile gloves. ? The skin over your port is cleaned carefully with an  antiseptic and allowed to dry. ? The port is gently pinched between sterile gloves, and a needle is inserted into the port.  Only "non-coring" port needles should be used to access the port. Once the port is accessed, a blood return should be checked. This helps ensure that the port is in the vein and is not clogged.  If your port needs to remain accessed for a constant infusion, a clear (transparent) bandage will be placed over the needle site. The bandage and needle will need to be changed every week, or as directed by your health care provider.  Keep the bandage covering the needle clean and dry. Do not get it wet. Follow your health care provider's instructions on how to take a shower or bath while the port is accessed.  If your port does not need to stay accessed, no bandage is needed over the port.  What is flushing? Flushing helps keep the port from getting clogged. Follow your health care provider's instructions on how and when to flush the port. Ports are usually flushed with saline solution or a medicine called heparin. The need for flushing will depend on how the port is used.  If the port is used for intermittent medicines or blood draws, the port will need to be flushed: ? After medicines have been given. ? After blood has been drawn. ? As part of  routine maintenance.  If a constant infusion is running, the port may not need to be flushed.  How long will my port stay implanted? The port can stay in for as long as your health care provider thinks it is needed. When it is time for the port to come out, surgery will be done to remove it. The procedure is similar to the one performed when the port was put in. When should I seek immediate medical care? When you have an implanted port, you should seek immediate medical care if:  You notice a bad smell coming from the incision site.  You have swelling, redness, or drainage at the incision site.  You have more swelling or pain at  the port site or the surrounding area.  You have a fever that is not controlled with medicine.  This information is not intended to replace advice given to you by your health care provider. Make sure you discuss any questions you have with your health care provider. Document Released: 10/19/2005 Document Revised: 03/26/2016 Document Reviewed: 06/26/2013 Elsevier Interactive Patient Education  2017 [employer].

## 2017-07-29 NOTE — Progress Notes (Signed)
Cystograms  Hematology and Oncology Follow Up Visit  Jessica [name] [id number] 02/13/1941 76 y.o. 07/29/2017   Principle Diagnosis:   Stage IIA (T2bN0M0) invasive ductal ca of the LEFT breast - TRIPLE NEGATIVE  Staphylococcus aureus wound infection of the LEFT axilla  Current Therapy:    Taxotere/Carboplatin - s/p cycle #1 on 04/28/2017     Interim History:  Jessica [name] is back for follow-up. Unfortunately, now, the problem is that her Port-A-Cath doesn't work. I am just so disappointed that we keep having these issues that are preventing her from getting treatment.  We tried very hard to get the Port-A-Cath to work. Nothing has worked. We will have to call her surgeon who put it in so they can get this tended to.  Otherwise, she is doing okay. Her husband is out of the hospital. He is in a nursing home. He has quite a few health issues.  She has had no problems with nausea or vomiting. She's had no diarrhea. There's been no mouth sores.  She's had no rashes. The been no leg swelling.  Currently, her performance status is ECOG 1.   Medications:  Current Outpatient Prescriptions:  .  calcium citrate-vitamin D (CITRACAL+D) 315-200 MG-UNIT per tablet, Take 1 tablet by mouth daily., Disp: , Rfl:  .  dexamethasone (DECADRON) 4 MG tablet, , Disp: , Rfl:  .  diphenhydramine-acetaminophen (TYLENOL PM) 25-500 MG TABS, Take 1 tablet by mouth at bedtime as needed (sleep). , Disp: , Rfl:  .  hydrochlorothiazide (MICROZIDE) 12.5 MG capsule, Take 12.5 mg by mouth daily., Disp: , Rfl: 1 .  levothyroxine (SYNTHROID, LEVOTHROID) 75 MCG tablet, Take 75 mcg by mouth daily., Disp: , Rfl:  .  lidocaine-prilocaine (EMLA) cream, Apply 1 application topically as needed., Disp: 30 g, Rfl: 6 .  LORazepam (ATIVAN) 0.5 MG tablet, Take 1 tablet (0.5 mg total) by mouth every 6 (six) hours as needed (Nausea or vomiting)., Disp: 30 tablet, Rfl: 0 .  Lysine 1000 MG TABS, Take 1,000 mg by mouth as needed.,  Disp: , Rfl:  .  Multiple Vitamin (MULTIVITAMIN) tablet, Take 1 tablet by mouth daily., Disp: , Rfl:  .  omeprazole (PRILOSEC OTC) 20 MG tablet, Take 20 mg by mouth daily., Disp: , Rfl:  .  omeprazole (PRILOSEC) 20 MG capsule, , Disp: , Rfl:  .  ondansetron (ZOFRAN) 8 MG tablet, Take 1 tablet (8 mg total) by mouth 2 (two) times daily as needed for refractory nausea / vomiting. Start on day 3 after chemo., Disp: 30 tablet, Rfl: 1 .  OVER THE COUNTER MEDICATION, Take 15 mLs by mouth 2 (two) times daily as needed (pain)., Disp: , Rfl:  .  polyvinyl alcohol (LIQUIFILM TEARS) 1.4 % ophthalmic solution, 1 drop as needed for dry eyes., Disp: , Rfl:  .  prochlorperazine (COMPAZINE) 10 MG tablet, TAKE 1 TABLET(10 MG) BY MOUTH EVERY 6 HOURS AS NEEDED FOR NAUSEA OR VOMITING, Disp: 385 tablet, Rfl: 1 .  venlafaxine XR (EFFEXOR-XR) 75 MG 24 hr capsule, Take 225 mg by mouth daily., Disp: , Rfl:   Allergies:  Allergies  Allergen Reactions  . Codeine Hives  . Penicillins Hives    Has patient had a PCN reaction causing immediate rash, facial/tongue/throat swelling, SOB or lightheadedness with hypotension: yes Has patient had a PCN reaction causing severe rash involving mucus membranes or skin necrosis:  no Has patient had a PCN reaction that required hospitalization:  no Has patient had a PCN reaction occurring within the  last 10 years: no If all of the above answers are "NO", then may proceed with Cephalosporin use.   . Advil [Ibuprofen] Hives and Itching  . Band-Aid Plus Antibiotic [Bacitracin-Polymyxin B] Dermatitis  . Benazepril Swelling    Possible cause of tongue swelling    Past Medical History, Surgical history, Social history, and Family History were reviewed and updated.  Review of Systems: As stated in the interim history  Physical Exam:  vitals were not taken for this visit.  Wt Readings from Last 3 Encounters:  07/29/17 163 lb (73.9 kg)  07/08/17 165 lb 1.9 oz (74.9 kg)  06/09/17  166 lb (75.3 kg)      A complete physical exam was performed on Jessica [name]. The results are noted below with appropriate changes:   Head and neck exam shows no ocular or oral lesions. There are no palpable cervical or supraclavicular lymph nodes. Lungs are clear. Cardiac exam regular rate and rhythm with no murmurs, rubs or bruits. Breast exam shows right breast with no masses, edema or erythema. There is no right axillary adenopathy. Left breast shows the well healing lumpectomy scar at the 12:00 position. This is at the age of the areola. There is some slight erythema. There is some slight firmness. The lumpectomy scar is well-healing. There is no swelling in the left axilla. She does have the packing in the cavity in the left axilla. Abdomen is soft. She has good bowel sounds. There is no palpable liver or spleen tip. Back exam shows no tenderness over the spine, ribs or hips. Extremities shows no clubbing, cyanosis or edema. Neurological exam shows no focal neurological deficits. Skin exam shows no rashes, ecchymoses or petechia.  Lab Results  Component Value Date   WBC 7.5 07/29/2017   HGB 10.9 (L) 07/29/2017   HCT 33.0 (L) 07/29/2017   MCV 101 07/29/2017   PLT 382 07/29/2017     Chemistry      Component Value Date/Time   NA 137 07/08/2017 1141   NA 141 06/16/2017 1014   K 3.4 07/08/2017 1141   K 3.8 06/16/2017 1014   CL 99 07/08/2017 1141   CO2 28 07/08/2017 1141   CO2 31 (H) 06/16/2017 1014   BUN 12 07/08/2017 1141   BUN 8.9 06/16/2017 1014   CREATININE 0.7 07/08/2017 1141   CREATININE 0.8 06/16/2017 1014      Component Value Date/Time   CALCIUM 9.9 07/08/2017 1141   CALCIUM 10.1 06/16/2017 1014   ALKPHOS 55 07/08/2017 1141   ALKPHOS 64 06/16/2017 1014   AST 29 07/08/2017 1141   AST 21 06/16/2017 1014   ALT 18 07/08/2017 1141   ALT 20 06/16/2017 1014   BILITOT 0.50 07/08/2017 1141   BILITOT 0.37 06/16/2017 1014         Impression and Plan: Jessica [name] is a  76 year old postmenopausal white female with a triple negative stage IIA ductal carcinoma of the left breast.  Again, we're not going to be able to treat her right now. We will have to get the Port-A-Cath fixed.  I will have to talk to her surgeon.  Hopefully, we can get her started next week.   [name], MD 9/27/20181:03 PM

## 2017-07-29 NOTE — Progress Notes (Signed)
Patient port gives 10 mL of blood return. However, when tubes attached port would not give blood return. Tried several saline flushes and position changes to receive blood return. Attempts unsuccessful. Patient denies pain, no swelling or redness noted at port-a-cath site. Dr. Marin Olp made aware.

## 2017-07-29 NOTE — Progress Notes (Signed)
1430- pt declined a PIV for chemo while TPA is in [facility] so NO chemo today.

## 2017-07-29 NOTE — Addendum Note (Signed)
Addended by: Smiley Houseman F on: 07/29/2017 01:44 PM   Modules accepted: Orders, SmartSet

## 2017-07-30 ENCOUNTER — Other Ambulatory Visit (HOSPITAL_COMMUNITY): Payer: Self-pay | Admitting: Surgery

## 2017-07-30 DIAGNOSIS — C50912 Malignant neoplasm of unspecified site of left female breast: Secondary | ICD-10-CM

## 2017-08-02 ENCOUNTER — Other Ambulatory Visit: Payer: Self-pay | Admitting: Radiology

## 2017-08-03 ENCOUNTER — Other Ambulatory Visit: Payer: Self-pay | Admitting: General Surgery

## 2017-08-04 ENCOUNTER — Ambulatory Visit (HOSPITAL_COMMUNITY)
Admission: RE | Admit: 2017-08-04 | Discharge: 2017-08-04 | Disposition: A | Discharge status: Home / Self Care | Payer: Medicare Other | Source: Ambulatory Visit | Attending: Surgery | Admitting: Surgery

## 2017-08-04 ENCOUNTER — Other Ambulatory Visit (HOSPITAL_COMMUNITY): Payer: Self-pay | Admitting: Surgery

## 2017-08-04 ENCOUNTER — Encounter (HOSPITAL_COMMUNITY): Payer: Self-pay

## 2017-08-04 DIAGNOSIS — F419 Anxiety disorder, unspecified: Secondary | ICD-10-CM | POA: Insufficient documentation

## 2017-08-04 DIAGNOSIS — E669 Obesity, unspecified: Secondary | ICD-10-CM | POA: Insufficient documentation

## 2017-08-04 DIAGNOSIS — C50912 Malignant neoplasm of unspecified site of left female breast: Secondary | ICD-10-CM

## 2017-08-04 DIAGNOSIS — E785 Hyperlipidemia, unspecified: Secondary | ICD-10-CM | POA: Diagnosis not present

## 2017-08-04 DIAGNOSIS — E039 Hypothyroidism, unspecified: Secondary | ICD-10-CM | POA: Insufficient documentation

## 2017-08-04 DIAGNOSIS — Y712 Prosthetic and other implants, materials and accessory cardiovascular devices associated with adverse incidents: Secondary | ICD-10-CM | POA: Diagnosis not present

## 2017-08-04 DIAGNOSIS — N189 Chronic kidney disease, unspecified: Secondary | ICD-10-CM | POA: Insufficient documentation

## 2017-08-04 DIAGNOSIS — K219 Gastro-esophageal reflux disease without esophagitis: Secondary | ICD-10-CM | POA: Diagnosis not present

## 2017-08-04 DIAGNOSIS — Z452 Encounter for adjustment and management of vascular access device: Secondary | ICD-10-CM | POA: Diagnosis not present

## 2017-08-04 DIAGNOSIS — Z880 Allergy status to penicillin: Secondary | ICD-10-CM | POA: Insufficient documentation

## 2017-08-04 DIAGNOSIS — T82598A Other mechanical complication of other cardiac and vascular devices and implants, initial encounter: Secondary | ICD-10-CM | POA: Diagnosis not present

## 2017-08-04 DIAGNOSIS — Z853 Personal history of malignant neoplasm of breast: Secondary | ICD-10-CM | POA: Diagnosis not present

## 2017-08-04 DIAGNOSIS — J45909 Unspecified asthma, uncomplicated: Secondary | ICD-10-CM | POA: Diagnosis not present

## 2017-08-04 DIAGNOSIS — I129 Hypertensive chronic kidney disease with stage 1 through stage 4 chronic kidney disease, or unspecified chronic kidney disease: Secondary | ICD-10-CM | POA: Insufficient documentation

## 2017-08-04 DIAGNOSIS — F329 Major depressive disorder, single episode, unspecified: Secondary | ICD-10-CM | POA: Insufficient documentation

## 2017-08-04 DIAGNOSIS — Z9012 Acquired absence of left breast and nipple: Secondary | ICD-10-CM | POA: Insufficient documentation

## 2017-08-04 HISTORY — PX: IR US GUIDE VASC ACCESS RIGHT: IMG2390

## 2017-08-04 HISTORY — PX: IR REMOVAL TUN ACCESS W/ PORT W/O FL MOD SED: IMG2290

## 2017-08-04 HISTORY — PX: IR FLUORO GUIDE PORT INSERTION RIGHT: IMG5741

## 2017-08-04 LAB — CBC WITH DIFFERENTIAL/PLATELET
Basophils Absolute: 0 10*3/uL (ref 0.0–0.1)
Basophils Relative: 0 %
Eosinophils Absolute: 0 10*3/uL (ref 0.0–0.7)
Eosinophils Relative: 0 %
HCT: 33.8 % — ABNORMAL LOW (ref 36.0–46.0)
Hemoglobin: 11 g/dL — ABNORMAL LOW (ref 12.0–15.0)
Lymphocytes Relative: 26 %
Lymphs Abs: 1.2 10*3/uL (ref 0.7–4.0)
MCH: 31.8 pg (ref 26.0–34.0)
MCHC: 32.5 g/dL (ref 30.0–36.0)
MCV: 97.7 fL (ref 78.0–100.0)
Monocytes Absolute: 0.4 10*3/uL (ref 0.1–1.0)
Monocytes Relative: 9 %
Neutro Abs: 3 10*3/uL (ref 1.7–7.7)
Neutrophils Relative %: 65 %
Platelets: 309 10*3/uL (ref 150–400)
RBC: 3.46 MIL/uL — ABNORMAL LOW (ref 3.87–5.11)
RDW: 15.8 % — ABNORMAL HIGH (ref 11.5–15.5)
WBC: 4.6 10*3/uL (ref 4.0–10.5)

## 2017-08-04 LAB — PROTIME-INR
INR: 0.92
Prothrombin Time: 12.3 seconds (ref 11.4–15.2)

## 2017-08-04 MED ORDER — CLINDAMYCIN PHOSPHATE 900 MG/50ML IV SOLN
INTRAVENOUS | Status: AC
  Filled 2017-08-04: qty 50

## 2017-08-04 MED ORDER — MIDAZOLAM HCL 2 MG/2ML IJ SOLN
INTRAMUSCULAR | Status: AC
  Filled 2017-08-04: qty 4

## 2017-08-04 MED ORDER — MIDAZOLAM HCL 2 MG/2ML IJ SOLN
INTRAMUSCULAR | Status: AC | PRN
  Administered 2017-08-04 (×2): 1 mg via INTRAVENOUS

## 2017-08-04 MED ORDER — FENTANYL CITRATE (PF) 100 MCG/2ML IJ SOLN
INTRAMUSCULAR | Status: AC
  Filled 2017-08-04: qty 4

## 2017-08-04 MED ORDER — SODIUM CHLORIDE 0.9 % IV SOLN
INTRAVENOUS | Status: DC
  Administered 2017-08-04: 08:00:00 via INTRAVENOUS

## 2017-08-04 MED ORDER — LIDOCAINE-EPINEPHRINE (PF) 2 %-1:200000 IJ SOLN
INTRAMUSCULAR | Status: AC
  Filled 2017-08-04: qty 20

## 2017-08-04 MED ORDER — HEPARIN SOD (PORK) LOCK FLUSH 100 UNIT/ML IV SOLN
INTRAVENOUS | Status: AC
  Filled 2017-08-04: qty 5

## 2017-08-04 MED ORDER — CLINDAMYCIN PHOSPHATE 900 MG/50ML IV SOLN
900.0000 mg | Freq: Once | INTRAVENOUS | Status: AC
  Administered 2017-08-04: 900 mg via INTRAVENOUS

## 2017-08-04 MED ORDER — FENTANYL CITRATE (PF) 100 MCG/2ML IJ SOLN
INTRAMUSCULAR | Status: AC | PRN
  Administered 2017-08-04 (×2): 25 ug via INTRAVENOUS
  Administered 2017-08-04: 50 ug via INTRAVENOUS

## 2017-08-04 NOTE — Discharge Instructions (Signed)
Moderate Conscious Sedation, Adult, Care After These instructions provide you with information about caring for yourself after your procedure. Your health care provider may also give you more specific instructions. Your treatment has been planned according to current medical practices, but problems sometimes occur. Call your health care provider if you have any problems or questions after your procedure. What can I expect after the procedure? After your procedure, it is common:  To feel sleepy for several hours.  To feel clumsy and have poor balance for several hours.  To have poor judgment for several hours.  To vomit if you eat too soon.  Follow these instructions at home: For at least 24 hours after the procedure:   Do not: ? Participate in activities where you could fall or become injured. ? Drive. ? Use heavy machinery. ? Drink alcohol. ? Take sleeping pills or medicines that cause drowsiness. ? Make important decisions or sign legal documents. ? Take care of children on your own.  Rest. Eating and drinking  Follow the diet recommended by your health care provider.  If you vomit: ? Drink water, juice, or soup when you can drink without vomiting. ? Make sure you have little or no nausea before eating solid foods. General instructions  Have a responsible adult stay with you until you are awake and alert.  Take over-the-counter and prescription medicines only as told by your health care provider.  If you smoke, do not smoke without supervision.  Keep all follow-up visits as told by your health care provider. This is important. Contact a health care provider if:  You keep feeling nauseous or you keep vomiting.  You feel light-headed.  You develop a rash.  You have a fever. Get help right away if:  You have trouble breathing. This information is not intended to replace advice given to you by your health care provider. Make sure you discuss any questions you have  with your health care provider. Document Released: 08/09/2013 Document Revised: 03/23/2016 Document Reviewed: 02/08/2016 Elsevier Interactive Patient Education  2018 [employer], Care After This sheet gives you information about how to care for yourself after your procedure. Your health care provider may also give you more specific instructions. If you have problems or questions, contact your health care provider. What can I expect after the procedure? After your procedure, it is common to have:  Discomfort at the port insertion site.  Bruising on the skin over the port. This should improve over 3-4 days.  Follow these instructions at home: [facility] care  After your port is placed, you will get a manufacturer's information card. The card has information about your port. Keep this card with you at all times.  Take care of the port as told by your health care provider. Ask your health care provider if you or a family member can get training for taking care of the port at home. A home health care nurse may also take care of the port.  Make sure to remember what type of port you have. Incision care  Follow instructions from your health care provider about how to take care of your port insertion site. Make sure you: ? Wash your hands with soap and water before you change your bandage (dressing). If soap and water are not available, use hand sanitizer. ? Change your dressing as told by your health care provider.  Please remove dressing tomorrow 08/05/17 ? Leave skin glue in place. These skin closures may  need to stay in place for 2 weeks or longer. Do not remove adhesive strips completely unless your health care provider tells you to do that.  DO NOT use EMLA cream for 2 weeks after having port placed as cream will remove surgical glue.  Check your port insertion site every day for signs of infection. Check for: ? More redness, swelling, or pain. ? More fluid or  blood. ? Warmth. ? Pus or a bad smell. General instructions  Do not take baths, swim, or use a hot tub until your health care provider approves.  You may shower tomorrow 08/05/17  Do not lift anything that is heavier than 10 lb (4.5 kg) for a week, or as told by your health care provider.  Ask your health care provider when it is okay to: ? Return to work or school. ? Resume usual physical activities or sports.  Do not drive for 24 hours if you were given a medicine to help you relax (sedative).  Take over-the-counter and prescription medicines only as told by your health care provider.  Wear a medical alert bracelet in case of an emergency. This will tell any health care providers that you have a port.  Keep all follow-up visits as told by your health care provider. This is important. Contact a health care provider if:   You have a fever or chills.  You have more redness, swelling, or pain around your port insertion site.  You have more fluid or blood coming from your port insertion site.  Your port insertion site feels warm to the touch.  You have pus or a bad smell coming from the port insertion site. Get help right away if:  You have chest pain or shortness of breath.  You have bleeding from your port that you cannot control. Summary  Take care of the port as told by your health care provider.  Change your dressing as told by your health care provider.  Keep all follow-up visits as told by your health care provider. This information is not intended to replace advice given to you by your health care provider. Make sure you discuss any questions you have with your health care provider. Document Released: 08/09/2013 Document Revised: 09/09/2016 Document Reviewed: 09/09/2016 Elsevier Interactive Patient Education  2017 [employer].

## 2017-08-04 NOTE — Consult Note (Signed)
Chief Complaint: Patient was seen in consultation today for port a cath revision/replacement  Referring Physician(s): Cornett,Thomas  Supervising Physician: Sandi Mariscal  Patient Status: [facility] - Out-pt  History of Present Illness: Jessica [name] is a 76 y.o. female with history of left breast carcinoma diagnosed earlier this year and subsequent left axilla staph infection in July of this year. She had right chest wall Port-A-Cath placed on 03/23/17 by [facility] along with left lumpectomy and sentinel lymph node dissection.There has been difficulty with aspirating blood from port as well as infusing medications despite prior TPA dwell. Request now received for Port-A-Cath revision/replacement.  Past Medical History:  Diagnosis Date  . Allergy   . Anemia   . Anxiety   . Asthma    in past, no inhalers now  . Blood transfusion without reported diagnosis   . Breast cancer ([place]) 01/2017   left breast   . Breast cancer of upper-inner quadrant of left female breast ([place]) 03/11/2017  . Cancer ([place]) 01/2017   left breast  . Cataract    bilateral removed  . Chronic kidney disease    kidney stones  . Depression 08/31/2013  . Dyslipidemia, goal LDL below 160 08/31/2013  . Essential hypertension - well controlled 08/31/2013  . Family history of breast cancer   . Family history of melanoma   . Family history of prostate cancer   . GERD (gastroesophageal reflux disease)   . Goals of care, counseling/discussion 03/11/2017  . Hypothyroidism   . Obesity (BMI 30-39.9) 08/31/2013  . Thyroid disease   . Vasovagal near-syncope -rare 08/31/2013    Past Surgical History:  Procedure Laterality Date  . ABDOMINAL HYSTERECTOMY     total  . BREAST BIOPSY Left 01/2017    malignant  . broken fingers    . CATARACT EXTRACTION Bilateral   . COLONOSCOPY    . EXCISIONAL HEMORRHOIDECTOMY    . IRRIGATION AND DEBRIDEMENT ABSCESS Left 05/18/2017   Procedure: IRRIGATION AND DEBRIDEMENT LEFT AXILLARY  ABSCESS;  Surgeon: Michael Boston, MD;  Location: [facility];  Service: General;  Laterality: Left;  . kidney stones lithotripsy    . PORTACATH PLACEMENT Right 03/23/2017   Procedure: INSERTION PORT-A-CATH;  Surgeon: Erroll Luna, MD;  Location: [place];  Service: General;  Laterality: Right;  . RADIOACTIVE SEED GUIDED MASTECTOMY WITH AXILLARY SENTINEL LYMPH NODE BIOPSY Left 03/23/2017   Procedure: LEFT BREAST RADIOACTIVE SEED GUIDED PARTIAL MASTECTOMY AND  SENTINEL LYMPH NODE MAPPING;  Surgeon: Erroll Luna, MD;  Location: [place];  Service: General;  Laterality: Left;  . ROTATOR CUFF REPAIR     left   . TONSILLECTOMY    . WISDOM TOOTH EXTRACTION      Allergies: Codeine; Penicillins; Advil [ibuprofen]; Band-aid plus antibiotic [bacitracin-polymyxin b]; and Benazepril  Medications: Prior to Admission medications   Medication Sig Start Date End Date Taking? Authorizing Provider  calcium citrate-vitamin D (CITRACAL+D) 315-200 MG-UNIT per tablet Take 1 tablet by mouth daily.   Yes [provider]  dexamethasone (DECADRON) 4 MG tablet  07/28/17  Yes [provider]  diphenhydramine-acetaminophen (TYLENOL PM) 25-500 MG TABS Take 1 tablet by mouth at bedtime as needed (sleep).    Yes [provider]  hydrochlorothiazide (MICROZIDE) 12.5 MG capsule Take 12.5 mg by mouth daily. 02/28/16  Yes [provider]  levothyroxine (SYNTHROID, LEVOTHROID) 75 MCG tablet Take 75 mcg by mouth daily. 07/19/13  Yes [provider]  lidocaine-prilocaine (EMLA) cream Apply 1 application topically as needed.  04/12/17  Yes Ennever, Rudell Cobb, MD  LORazepam (ATIVAN) 0.5 MG tablet Take 1 tablet (0.5 mg total) by mouth every 6 (six) hours as needed (Nausea or vomiting). 04/14/17  Yes Ennever, Rudell Cobb, MD  Lysine 1000 MG TABS Take 1,000 mg by mouth as needed.   Yes [provider]  Multiple Vitamin (MULTIVITAMIN) tablet Take 1 tablet by  mouth daily.   Yes [provider]  omeprazole (PRILOSEC) 20 MG capsule  06/24/17  Yes [provider]  polyvinyl alcohol (LIQUIFILM TEARS) 1.4 % ophthalmic solution 1 drop as needed for dry eyes.   Yes [provider]  prochlorperazine (COMPAZINE) 10 MG tablet TAKE 1 TABLET(10 MG) BY MOUTH EVERY 6 HOURS AS NEEDED FOR NAUSEA OR VOMITING 04/14/17  Yes Ennever, Rudell Cobb, MD  venlafaxine XR (EFFEXOR-XR) 75 MG 24 hr capsule Take 225 mg by mouth daily.   Yes [provider]  omeprazole (PRILOSEC OTC) 20 MG tablet Take 20 mg by mouth daily.    [provider]  ondansetron (ZOFRAN) 8 MG tablet Take 1 tablet (8 mg total) by mouth 2 (two) times daily as needed for refractory nausea / vomiting. Start on day 3 after chemo. 04/14/17   [name], MD  OVER THE COUNTER MEDICATION Take 15 mLs by mouth 2 (two) times daily as needed (pain).    [provider]     Family History  Problem Relation Age of Onset  . Heart disease Mother        Angina  . Heart disease Father   . Cervical cancer Maternal Aunt   . Heart attack Brother        Died of MI in his mid 49s  . Prostate cancer Brother        dx in his early 50s  . Breast cancer Paternal Aunt        dx over 36  . Bone cancer Cousin        maternal first cousin dx in her 17s-60s  . Lung cancer Cousin        paternal first cousin  . Colon cancer Neg Hx   . Pancreatic cancer Neg Hx   . Stomach cancer Neg Hx   . Esophageal cancer Neg Hx   . Rectal cancer Neg Hx     Social History   Social History  . Marital status: Married    Spouse name: N/A  . Number of children: 3  . Years of education: N/A   Occupational History  . retired     Social History Main Topics  . Smoking status: Never Smoker  . Smokeless tobacco: Never Used  . Alcohol use No  . Drug use: No  . Sexual activity: Not Asked   Other Topics Concern  . None   Social History Narrative   Married mother of 3 with one  grandchild. Never smoked. Does not drink alcohol.   Walks 3-4 days a week for roughly 20-30 minutes.      Review of Systems denies fever, headache, chest pain, dyspnea, cough, abdominal/back pain, nausea, vomiting bleeding.  Vital Signs: BP (!) 153/77 (BP Location: Right Arm)   Pulse 80   Temp 98.3 F (36.8 C) (Oral)   Resp 16   SpO2 96%   Physical Exam awake, alert. Chest clear to auscultation bilaterally. Clean, intact right chest wall Port-A-Cath. Heart with regular rate, some occasional pauses noted. Abdomen soft, positive bowel sounds, nontender. No lower extremity edema.  Imaging: No results  found.  Labs:  CBC:  Recent Labs  06/16/17 1014 07/08/17 1141 07/29/17 1133 08/04/17 0743  WBC 4.0 6.5 7.5 4.6  HGB 11.8 11.7 10.9* 11.0*  HCT 37.0 35.7 33.0* 33.8*  PLT 291 260 382 309    COAGS:  Recent Labs  08/04/17 0743  INR 0.92    BMP:  Recent Labs  05/18/17 1006 05/19/17 0506 05/20/17 0600 05/21/17 0453 06/16/17 1014 07/08/17 1141 07/29/17 1133  NA 139 140 140 140 141 137 141  K 3.9 4.0 3.3* 3.7 3.8 3.4 4.2  CL 102 103 102 103  --  99 102  CO2 30 28 32 31 31* 28 29  GLUCOSE 88 125* 101* 98 79 169* 117  BUN 9 8 8 14  8.9 12 11   CALCIUM 9.4 9.1 8.9 8.8* 10.1 9.9 9.9  CREATININE 0.64 0.70 0.61 0.67 0.8 0.7 0.6  GFRNONAA >60 >60 >60 >60  --   --   --   GFRAA >60 >60 >60 >60  --   --   --     LIVER FUNCTION TESTS:  Recent Labs  05/20/17 0600 06/16/17 1014 07/08/17 1141 07/29/17 1133  BILITOT 0.5 0.37 0.50 0.60  AST 18 21 29 20   ALT 17 20 18 18   ALKPHOS 57 64 55 61  PROT 6.5 7.1 7.1 7.2  ALBUMIN 3.0* 3.5 3.4 3.6    TUMOR MARKERS: No results for input(s): AFPTM, CEA, CA199, CHROMGRNA in the last 8760 hours.  Assessment and Plan: 76 y.o. female with history of left breast carcinoma diagnosed earlier this year and subsequent left axilla staph infection in July of this year. She had right chest wall Port-A-Cath placed on 03/23/17 by [facility]  along with left lumpectomy and sentinel lymph node dissection.There has been difficulty with aspirating blood from port as well as infusing medications despite prior TPA dwell. Request now received for Port-A-Cath revision/replacement.Risks and benefits discussed with the patient/daughter including, but not limited to bleeding, infection, pneumothorax, or fibrin sheath development and need for additional procedures. All of the patient's questions were answered, patient is agreeable to proceed. Consent signed and in chart.     Thank you for this interesting consult.  I greatly enjoyed meeting Jessica [name] and look forward to participating in their care.  A copy of this report was sent to the requesting provider on this date.  Electronically Signed: D. Rowe Robert, PA-C 08/04/2017, 8:39 AM   I spent a total of 25 minutes  in face to face in clinical consultation, greater than 50% of which was counseling/coordinating care for Port-A-Cath revision/replacement

## 2017-08-04 NOTE — Procedures (Signed)
Pre Procedure Dx: Port venous access Post Procedural Dx: Same  Successful placement of a new right IJ approach port-a-cath with tip at the superior caval atrial junction. The catheter is ready for immediate use.  Estimated Blood Loss: Minimal  Complications: None immediate.  Ronny Bacon, MD Pager #: [phone]

## 2017-08-04 NOTE — Sedation Documentation (Signed)
Patient denies pain and is resting comfortably.  

## 2017-08-05 ENCOUNTER — Telehealth: Payer: Self-pay | Admitting: *Deleted

## 2017-08-05 NOTE — Telephone Encounter (Signed)
Patient's daughter calling to say patient has her new port. States they have emla cream for next regularly scheduled appt for lab/dr E/chemo on 08/19/17.

## 2017-08-13 DIAGNOSIS — M3501 Sicca syndrome with keratoconjunctivitis: Secondary | ICD-10-CM | POA: Diagnosis not present

## 2017-08-13 DIAGNOSIS — B0230 Zoster ocular disease, unspecified: Secondary | ICD-10-CM | POA: Diagnosis not present

## 2017-08-13 DIAGNOSIS — H04123 Dry eye syndrome of bilateral lacrimal glands: Secondary | ICD-10-CM | POA: Diagnosis not present

## 2017-08-13 DIAGNOSIS — H179 Unspecified corneal scar and opacity: Secondary | ICD-10-CM | POA: Diagnosis not present

## 2017-08-19 ENCOUNTER — Ambulatory Visit: Payer: Medicare Other

## 2017-08-19 ENCOUNTER — Ambulatory Visit (HOSPITAL_BASED_OUTPATIENT_CLINIC_OR_DEPARTMENT_OTHER): Payer: Medicare Other

## 2017-08-19 ENCOUNTER — Other Ambulatory Visit (HOSPITAL_BASED_OUTPATIENT_CLINIC_OR_DEPARTMENT_OTHER): Payer: Medicare Other

## 2017-08-19 ENCOUNTER — Ambulatory Visit (HOSPITAL_BASED_OUTPATIENT_CLINIC_OR_DEPARTMENT_OTHER): Payer: Medicare Other | Admitting: Hematology & Oncology

## 2017-08-19 VITALS — BP 157/75 | HR 86 | Temp 98.0°F | Resp 16 | Wt 164.0 lb

## 2017-08-19 DIAGNOSIS — C50212 Malignant neoplasm of upper-inner quadrant of left female breast: Secondary | ICD-10-CM

## 2017-08-19 DIAGNOSIS — N61 Mastitis without abscess: Secondary | ICD-10-CM

## 2017-08-19 DIAGNOSIS — Z171 Estrogen receptor negative status [ER-]: Secondary | ICD-10-CM

## 2017-08-19 DIAGNOSIS — Z170 Estrogen receptor positive status [ER+]: Secondary | ICD-10-CM

## 2017-08-19 DIAGNOSIS — Z5111 Encounter for antineoplastic chemotherapy: Secondary | ICD-10-CM

## 2017-08-19 DIAGNOSIS — Z5189 Encounter for other specified aftercare: Secondary | ICD-10-CM | POA: Diagnosis not present

## 2017-08-19 DIAGNOSIS — L03112 Cellulitis of left axilla: Secondary | ICD-10-CM

## 2017-08-19 LAB — CMP (CANCER CENTER ONLY)
ALT(SGPT): 20 U/L (ref 10–47)
AST: 22 U/L (ref 11–38)
Albumin: 3.4 g/dL (ref 3.3–5.5)
Alkaline Phosphatase: 60 U/L (ref 26–84)
BUN, Bld: 11 mg/dL (ref 7–22)
CO2: 29 mEq/L (ref 18–33)
Calcium: 9.9 mg/dL (ref 8.0–10.3)
Chloride: 101 mEq/L (ref 98–108)
Creat: 0.8 mg/dl (ref 0.6–1.2)
Glucose, Bld: 100 mg/dL (ref 73–118)
Potassium: 3.4 mEq/L (ref 3.3–4.7)
Sodium: 141 mEq/L (ref 128–145)
Total Bilirubin: 0.4 mg/dl (ref 0.20–1.60)
Total Protein: 7.6 g/dL (ref 6.4–8.1)

## 2017-08-19 LAB — CBC WITH DIFFERENTIAL (CANCER CENTER ONLY)
BASO#: 0 10*3/uL (ref 0.0–0.2)
BASO%: 0.2 % (ref 0.0–2.0)
EOS%: 0 % (ref 0.0–7.0)
Eosinophils Absolute: 0 10*3/uL (ref 0.0–0.5)
HCT: 35.1 % (ref 34.8–46.6)
HGB: 11.6 g/dL (ref 11.6–15.9)
LYMPH#: 0.6 10*3/uL — ABNORMAL LOW (ref 0.9–3.3)
LYMPH%: 9.4 % — ABNORMAL LOW (ref 14.0–48.0)
MCH: 33 pg (ref 26.0–34.0)
MCHC: 33 g/dL (ref 32.0–36.0)
MCV: 100 fL (ref 81–101)
MONO#: 0.4 10*3/uL (ref 0.1–0.9)
MONO%: 6.3 % (ref 0.0–13.0)
NEUT#: 5.4 10*3/uL (ref 1.5–6.5)
NEUT%: 84.1 % — ABNORMAL HIGH (ref 39.6–80.0)
Platelets: 241 10*3/uL (ref 145–400)
RBC: 3.51 10*6/uL — ABNORMAL LOW (ref 3.70–5.32)
RDW: 14.1 % (ref 11.1–15.7)
WBC: 6.4 10*3/uL (ref 3.9–10.0)

## 2017-08-19 MED ORDER — SODIUM CHLORIDE 0.9% FLUSH
10.0000 mL | INTRAVENOUS | Status: DC | PRN
  Administered 2017-08-19: 10 mL
  Filled 2017-08-19: qty 10

## 2017-08-19 MED ORDER — SODIUM CHLORIDE 0.9 % IJ SOLN
10.0000 mL | Freq: Once | INTRAMUSCULAR | Status: AC
  Administered 2017-08-19: 10 mL
  Filled 2017-08-19: qty 10

## 2017-08-19 MED ORDER — DOXYCYCLINE HYCLATE 100 MG PO TABS: 100.0000 mg | ORAL_TABLET | Freq: Two times a day (BID) | ORAL | 0 refills | Status: DC

## 2017-08-19 MED ORDER — PALONOSETRON HCL INJECTION 0.25 MG/5ML
INTRAVENOUS | Status: AC
  Filled 2017-08-19: qty 5

## 2017-08-19 MED ORDER — PEGFILGRASTIM 6 MG/0.6ML ~~LOC~~ PSKT
6.0000 mg | PREFILLED_SYRINGE | Freq: Once | SUBCUTANEOUS | Status: AC
  Administered 2017-08-19: 6 mg via SUBCUTANEOUS

## 2017-08-19 MED ORDER — PEGFILGRASTIM 6 MG/0.6ML ~~LOC~~ PSKT
PREFILLED_SYRINGE | SUBCUTANEOUS | Status: AC
  Filled 2017-08-19: qty 0.6

## 2017-08-19 MED ORDER — SODIUM CHLORIDE 0.9 % IV SOLN
4.0300 mg/kg | Freq: Once | INTRAVENOUS | Status: AC
  Administered 2017-08-19: 300 mg via INTRAVENOUS
  Filled 2017-08-19: qty 6

## 2017-08-19 MED ORDER — DEXAMETHASONE SODIUM PHOSPHATE 10 MG/ML IJ SOLN
10.0000 mg | Freq: Once | INTRAMUSCULAR | Status: AC
  Administered 2017-08-19: 10 mg via INTRAVENOUS

## 2017-08-19 MED ORDER — PALONOSETRON HCL INJECTION 0.25 MG/5ML
0.2500 mg | Freq: Once | INTRAVENOUS | Status: AC
  Administered 2017-08-19: 0.25 mg via INTRAVENOUS

## 2017-08-19 MED ORDER — SODIUM CHLORIDE 0.9 % IV SOLN
Freq: Once | INTRAVENOUS | Status: AC
  Administered 2017-08-19: 12:00:00 via INTRAVENOUS

## 2017-08-19 MED ORDER — HEPARIN SOD (PORK) LOCK FLUSH 100 UNIT/ML IV SOLN
500.0000 [IU] | Freq: Once | INTRAVENOUS | Status: AC | PRN
  Administered 2017-08-19: 500 [IU]
  Filled 2017-08-19: qty 5

## 2017-08-19 MED ORDER — DEXAMETHASONE SODIUM PHOSPHATE 10 MG/ML IJ SOLN
INTRAMUSCULAR | Status: AC
  Filled 2017-08-19: qty 1

## 2017-08-19 MED ORDER — SODIUM CHLORIDE 0.9 % IV SOLN
400.0000 mg | Freq: Once | INTRAVENOUS | Status: AC
  Administered 2017-08-19: 400 mg via INTRAVENOUS
  Filled 2017-08-19: qty 40

## 2017-08-19 MED ORDER — DOCETAXEL CHEMO INJECTION 160 MG/16ML
67.5000 mg/m2 | Freq: Once | INTRAVENOUS | Status: AC
  Administered 2017-08-19: 120 mg via INTRAVENOUS
  Filled 2017-08-19: qty 12

## 2017-08-19 NOTE — Patient Instructions (Signed)
Implanted Port Home Guide An implanted port is a type of central line that is placed under the skin. Central lines are used to provide IV access when treatment or nutrition needs to be given through a person's veins. Implanted ports are used for long-term IV access. An implanted port may be placed because:  You need IV medicine that would be irritating to the small veins in your hands or arms.  You need long-term IV medicines, such as antibiotics.  You need IV nutrition for a long period.  You need frequent blood draws for lab tests.  You need dialysis.  Implanted ports are usually placed in the chest area, but they can also be placed in the upper arm, the abdomen, or the leg. An implanted port has two main parts:  Reservoir. The reservoir is round and will appear as a small, raised area under your skin. The reservoir is the part where a needle is inserted to give medicines or draw blood.  Catheter. The catheter is a thin, flexible tube that extends from the reservoir. The catheter is placed into a large vein. Medicine that is inserted into the reservoir goes into the catheter and then into the vein.  How will I care for my incision site? Do not get the incision site wet. Bathe or shower as directed by your health care provider. How is my port accessed? Special steps must be taken to access the port:  Before the port is accessed, a numbing cream can be placed on the skin. This helps numb the skin over the port site.  Your health care provider uses a sterile technique to access the port. ? Your health care provider must put on a mask and sterile gloves. ? The skin over your port is cleaned carefully with an antiseptic and allowed to dry. ? The port is gently pinched between sterile gloves, and a needle is inserted into the port.  Only "non-coring" port needles should be used to access the port. Once the port is accessed, a blood return should be checked. This helps ensure that the port  is in the vein and is not clogged.  If your port needs to remain accessed for a constant infusion, a clear (transparent) bandage will be placed over the needle site. The bandage and needle will need to be changed every week, or as directed by your health care provider.  Keep the bandage covering the needle clean and dry. Do not get it wet. Follow your health care provider's instructions on how to take a shower or bath while the port is accessed.  If your port does not need to stay accessed, no bandage is needed over the port.  What is flushing? Flushing helps keep the port from getting clogged. Follow your health care provider's instructions on how and when to flush the port. Ports are usually flushed with saline solution or a medicine called heparin. The need for flushing will depend on how the port is used.  If the port is used for intermittent medicines or blood draws, the port will need to be flushed: ? After medicines have been given. ? After blood has been drawn. ? As part of routine maintenance.  If a constant infusion is running, the port may not need to be flushed.  How long will my port stay implanted? The port can stay in for as long as your health care provider thinks it is needed. When it is time for the port to come out, surgery will be   done to remove it. The procedure is similar to the one performed when the port was put in. When should I seek immediate medical care? When you have an implanted port, you should seek immediate medical care if:  You notice a bad smell coming from the incision site.  You have swelling, redness, or drainage at the incision site.  You have more swelling or pain at the port site or the surrounding area.  You have a fever that is not controlled with medicine.  This information is not intended to replace advice given to you by your health care provider. Make sure you discuss any questions you have with your health care provider. Document  Released: 10/19/2005 Document Revised: 03/26/2016 Document Reviewed: 06/26/2013 Elsevier Interactive Patient Education  2017 Elsevier Inc[employer]  

## 2017-08-19 NOTE — Progress Notes (Signed)
Cystograms  Hematology and Oncology Follow Up Visit  Jessica [name] [id number] 21-Feb-1941 76 y.o. 08/19/2017   Principle Diagnosis:   Stage IIA (T2bN0M0) invasive ductal ca of the LEFT breast - TRIPLE NEGATIVE  Staphylococcus aureus wound infection of the LEFT axilla  Cellulitis of the left breast  Current Therapy:    Taxotere/Carboplatin - s/p cycle #2      Interim History:  Jessica [name] is back for follow-up. As is the case with her, there is usually a new issue that we have to deal with. The last time she was here, her Port-A-Cath was not working. This is now doing well.  She woke up this morning with erythema of the left breast. This is the interim half of the left breast. There is no left breast swelling. There is no wound. The lumpectomy scar is well-healed. There is no nipple discharge. She's had no fever. Her temperature is 98 degrees which, according to her daughter, is a little high for her. Again, there's been no pain associated with this erythema.  She's had no cough or shortness of breath. She's had no change in bowel or bladder habits. She's had no bleeding. She's had no leg swelling.  As far as chemotherapy is concerned, she has tolerated chemotherapy quite nicely.   Overall, her performance status is ECOG 1.  Medications:  Current Outpatient Prescriptions:  .  calcium citrate-vitamin D (CITRACAL+D) 315-200 MG-UNIT per tablet, Take 1 tablet by mouth daily., Disp: , Rfl:  .  dexamethasone (DECADRON) 4 MG tablet, , Disp: , Rfl:  .  diphenhydramine-acetaminophen (TYLENOL PM) 25-500 MG TABS, Take 1 tablet by mouth at bedtime as needed (sleep). , Disp: , Rfl:  .  hydrochlorothiazide (MICROZIDE) 12.5 MG capsule, Take 12.5 mg by mouth daily., Disp: , Rfl: 1 .  levothyroxine (SYNTHROID, LEVOTHROID) 75 MCG tablet, Take 75 mcg by mouth daily., Disp: , Rfl:  .  lidocaine-prilocaine (EMLA) cream, Apply 1 application topically as needed., Disp: 30 g, Rfl: 6 .  LORazepam  (ATIVAN) 0.5 MG tablet, Take 1 tablet (0.5 mg total) by mouth every 6 (six) hours as needed (Nausea or vomiting)., Disp: 30 tablet, Rfl: 0 .  Lysine 1000 MG TABS, Take 1,000 mg by mouth as needed., Disp: , Rfl:  .  Multiple Vitamin (MULTIVITAMIN) tablet, Take 1 tablet by mouth daily., Disp: , Rfl:  .  omeprazole (PRILOSEC) 20 MG capsule, , Disp: , Rfl:  .  ondansetron (ZOFRAN) 8 MG tablet, Take 1 tablet (8 mg total) by mouth 2 (two) times daily as needed for refractory nausea / vomiting. Start on day 3 after chemo., Disp: 30 tablet, Rfl: 1 .  OVER THE COUNTER MEDICATION, Take 15 mLs by mouth 2 (two) times daily as needed (pain)., Disp: , Rfl:  .  polyvinyl alcohol (LIQUIFILM TEARS) 1.4 % ophthalmic solution, 1 drop as needed for dry eyes., Disp: , Rfl:  .  prochlorperazine (COMPAZINE) 10 MG tablet, TAKE 1 TABLET(10 MG) BY MOUTH EVERY 6 HOURS AS NEEDED FOR NAUSEA OR VOMITING, Disp: 385 tablet, Rfl: 1 .  venlafaxine XR (EFFEXOR-XR) 75 MG 24 hr capsule, Take 225 mg by mouth daily., Disp: , Rfl:   Allergies:  Allergies  Allergen Reactions  . Codeine Hives  . Penicillins Hives    Has patient had a PCN reaction causing immediate rash, facial/tongue/throat swelling, SOB or lightheadedness with hypotension: yes Has patient had a PCN reaction causing severe rash involving mucus membranes or skin necrosis:no Has patient had a PCN reaction  that required hospitalization:no Has patient had a PCN reaction occurring within the last 10 years: no If all of the above answers are "NO", then may proceed with Cephalosporin use. Has patient had a PCN reaction causing immediate rash, facial/tongue/throat swelling, SOB or lightheadedness with hypotension: yes Has patient had a PCN reaction causing severe rash involving mucus membranes or skin necrosis:  no Has patient had a PCN reaction that required hospitalization:  no Has patient had a PCN reaction occurring within the last 10 years: no If all of the above  answers are "NO", then may proceed with Cephalosporin use.   . Advil [Ibuprofen] Hives and Itching  . Band-Aid Plus Antibiotic [Bacitracin-Polymyxin B] Dermatitis  . Benazepril Swelling    Possible cause of tongue swelling    Past Medical History, Surgical history, Social history, and Family History were reviewed and updated.  Review of Systems: As stated in the interim history  Physical Exam:  weight is 164 lb (74.4 kg). Her oral temperature is 98 F (36.7 C). Her blood pressure is 157/75 (abnormal) and her pulse is 86. Her respiration is 16 and oxygen saturation is 99%.   Wt Readings from Last 3 Encounters:  08/19/17 164 lb (74.4 kg)  07/29/17 163 lb (73.9 kg)  07/08/17 165 lb 1.9 oz (74.9 kg)      A complete physical exam was performed on Jessica [name]. The results are noted below with appropriate changes:   Head and neck exam shows no ocular or oral lesions. There are no palpable cervical or supraclavicular lymph nodes. Lungs are clear. Cardiac exam regular rate and rhythm with no murmurs, rubs or bruits. Breast exam shows right breast with no masses, edema or erythema. There is no right axillary adenopathy. Left breast shows the well healing lumpectomy scar at the 12:00 position. This is at the age of the areola. She does have erythema on the medial aspect of the left breast. There is a little bit of warmth to this erythema. There is no tenderness to palpation of the left breast. There is no open wound. There is no nipple discharge. There is some slight erythema. There is some slight firmness. The lumpectomy scar is well-healing. There is no swelling in the left axilla. Abdomen is soft. She has good bowel sounds. There is no palpable liver or spleen tip. Back exam shows no tenderness over the spine, ribs or hips. Extremities shows no clubbing, cyanosis or edema. Neurological exam shows no focal neurological deficits. Skin exam shows no rashes, ecchymoses or petechia.  Lab Results    Component Value Date   WBC 6.4 08/19/2017   HGB 11.6 08/19/2017   HCT 35.1 08/19/2017   MCV 100 08/19/2017   PLT 241 08/19/2017     Chemistry      Component Value Date/Time   NA 141 08/19/2017 0956   NA 141 06/16/2017 1014   K 3.4 08/19/2017 0956   K 3.8 06/16/2017 1014   CL 101 08/19/2017 0956   CO2 29 08/19/2017 0956   CO2 31 (H) 06/16/2017 1014   BUN 11 08/19/2017 0956   BUN 8.9 06/16/2017 1014   CREATININE 0.8 08/19/2017 0956   CREATININE 0.8 06/16/2017 1014      Component Value Date/Time   CALCIUM 9.9 08/19/2017 0956   CALCIUM 10.1 06/16/2017 1014   ALKPHOS 60 08/19/2017 0956   ALKPHOS 64 06/16/2017 1014   AST 22 08/19/2017 0956   AST 21 06/16/2017 1014   ALT 20 08/19/2017 0956   ALT  20 06/16/2017 1014   BILITOT 0.40 08/19/2017 0956   BILITOT 0.37 06/16/2017 1014       Impression and Plan: Jessica [name] is a 75 year old postmenopausal white female with a triple negative stage IIA ductal carcinoma of the left breast.  For right now, we will go ahead with her third cycle of treatment. She has get Neulasta so this I think will be helpful for her.  The problem today is now this cellulitis that she has on the left breast. We still can go ahead with her chemotherapy. I will definitely need to make sure that she gets a dose of IV antibiotic with Cubicin as she has a penicillin allergy. I will then put her on doxycycline orally.  We will have her come in in 10 days so we can reassess this area of cellulitis.  [name], MD 10/18/201811:17 AM

## 2017-08-19 NOTE — Patient Instructions (Signed)
[facility] Discharge Instructions for Patients Receiving Chemotherapy  Today you received the following chemotherapy agents Taxotere/Carboplatin To help prevent nausea and vomiting after your treatment, we encourage you to take your nausea medication as prescribed.   If you develop nausea and vomiting that is not controlled by your nausea medication, call the clinic.   BELOW ARE SYMPTOMS THAT SHOULD BE REPORTED IMMEDIATELY:  *FEVER GREATER THAN 100.5 F  *CHILLS WITH OR WITHOUT FEVER  NAUSEA AND VOMITING THAT IS NOT CONTROLLED WITH YOUR NAUSEA MEDICATION  *UNUSUAL SHORTNESS OF BREATH  *UNUSUAL BRUISING OR BLEEDING  TENDERNESS IN MOUTH AND THROAT WITH OR WITHOUT PRESENCE OF ULCERS  *URINARY PROBLEMS  *BOWEL PROBLEMS  UNUSUAL RASH Items with * indicate a potential emergency and should be followed up as soon as possible.  Feel free to call the clinic should you have any questions or concerns. The clinic phone number is [phone].  Please show the CHEMO ALERT CARD at check-in to the Emergency Department and triage nurse.   

## 2017-08-25 ENCOUNTER — Telehealth: Payer: Self-pay | Admitting: *Deleted

## 2017-08-25 NOTE — Telephone Encounter (Signed)
Patient c/o sore throat. She has no other symptoms including fever or cough. Daughter would like to know if we need to see patient or if she should be worked up by her PCP.  Spoke to [facility] NP, and she would like patient to be assessed by her PCP. Daughter understands and will call their office.

## 2017-08-26 DIAGNOSIS — B370 Candidal stomatitis: Secondary | ICD-10-CM | POA: Diagnosis not present

## 2017-08-27 ENCOUNTER — Other Ambulatory Visit (HOSPITAL_BASED_OUTPATIENT_CLINIC_OR_DEPARTMENT_OTHER): Payer: Medicare Other

## 2017-08-27 ENCOUNTER — Other Ambulatory Visit: Payer: Self-pay | Admitting: Family

## 2017-08-27 ENCOUNTER — Ambulatory Visit (HOSPITAL_BASED_OUTPATIENT_CLINIC_OR_DEPARTMENT_OTHER): Payer: Medicare Other

## 2017-08-27 ENCOUNTER — Ambulatory Visit: Payer: Medicare Other

## 2017-08-27 ENCOUNTER — Other Ambulatory Visit: Payer: Medicare Other

## 2017-08-27 ENCOUNTER — Ambulatory Visit (HOSPITAL_BASED_OUTPATIENT_CLINIC_OR_DEPARTMENT_OTHER): Payer: Medicare Other | Admitting: Family

## 2017-08-27 VITALS — BP 145/69 | HR 76

## 2017-08-27 VITALS — BP 146/68 | HR 83 | Temp 97.8°F | Resp 16 | Wt 160.0 lb

## 2017-08-27 DIAGNOSIS — Z95828 Presence of other vascular implants and grafts: Secondary | ICD-10-CM

## 2017-08-27 DIAGNOSIS — C50212 Malignant neoplasm of upper-inner quadrant of left female breast: Secondary | ICD-10-CM | POA: Diagnosis not present

## 2017-08-27 DIAGNOSIS — E860 Dehydration: Secondary | ICD-10-CM | POA: Diagnosis not present

## 2017-08-27 DIAGNOSIS — E876 Hypokalemia: Secondary | ICD-10-CM | POA: Diagnosis not present

## 2017-08-27 DIAGNOSIS — B379 Candidiasis, unspecified: Secondary | ICD-10-CM | POA: Diagnosis not present

## 2017-08-27 DIAGNOSIS — N61 Mastitis without abscess: Secondary | ICD-10-CM

## 2017-08-27 LAB — CBC WITH DIFFERENTIAL (CANCER CENTER ONLY)
BASO#: 0 10*3/uL (ref 0.0–0.2)
BASO%: 0.1 % (ref 0.0–2.0)
EOS%: 0 % (ref 0.0–7.0)
Eosinophils Absolute: 0 10*3/uL (ref 0.0–0.5)
HCT: 36.2 % (ref 34.8–46.6)
HGB: 12 g/dL (ref 11.6–15.9)
LYMPH#: 1.9 10*3/uL (ref 0.9–3.3)
LYMPH%: 25.6 % (ref 14.0–48.0)
MCH: 32.9 pg (ref 26.0–34.0)
MCHC: 33.1 g/dL (ref 32.0–36.0)
MCV: 99 fL (ref 81–101)
MONO#: 0.8 10*3/uL (ref 0.1–0.9)
MONO%: 10.3 % (ref 0.0–13.0)
NEUT#: 4.8 10*3/uL (ref 1.5–6.5)
NEUT%: 64 % (ref 39.6–80.0)
Platelets: 230 10*3/uL (ref 145–400)
RBC: 3.65 10*6/uL — ABNORMAL LOW (ref 3.70–5.32)
RDW: 13.6 % (ref 11.1–15.7)
WBC: 7.5 10*3/uL (ref 3.9–10.0)

## 2017-08-27 LAB — CMP (CANCER CENTER ONLY)
ALT(SGPT): 27 U/L (ref 10–47)
AST: 22 U/L (ref 11–38)
Albumin: 3.2 g/dL — ABNORMAL LOW (ref 3.3–5.5)
Alkaline Phosphatase: 73 U/L (ref 26–84)
BUN, Bld: 8 mg/dL (ref 7–22)
CO2: 33 mEq/L (ref 18–33)
Calcium: 9.7 mg/dL (ref 8.0–10.3)
Chloride: 96 mEq/L — ABNORMAL LOW (ref 98–108)
Creat: 0.9 mg/dl (ref 0.6–1.2)
Glucose, Bld: 108 mg/dL (ref 73–118)
Potassium: 2.9 mEq/L — CL (ref 3.3–4.7)
Sodium: 141 mEq/L (ref 128–145)
Total Bilirubin: 0.5 mg/dl (ref 0.20–1.60)
Total Protein: 7 g/dL (ref 6.4–8.1)

## 2017-08-27 LAB — TECHNOLOGIST REVIEW CHCC SATELLITE

## 2017-08-27 MED ORDER — SODIUM CHLORIDE 0.9% FLUSH
10.0000 mL | INTRAVENOUS | Status: DC | PRN
  Administered 2017-08-27: 10 mL via INTRAVENOUS
  Filled 2017-08-27: qty 10

## 2017-08-27 MED ORDER — POTASSIUM CHLORIDE CRYS ER 20 MEQ PO TBCR
40.0000 meq | EXTENDED_RELEASE_TABLET | Freq: Once | ORAL | Status: DC
  Administered 2017-08-27: 40 meq via ORAL
  Filled 2017-08-27: qty 2

## 2017-08-27 MED ORDER — POTASSIUM CHLORIDE CRYS ER 20 MEQ PO TBCR: 40.0000 meq | EXTENDED_RELEASE_TABLET | Freq: Every day | ORAL | 0 refills | Status: DC

## 2017-08-27 MED ORDER — HEPARIN SOD (PORK) LOCK FLUSH 100 UNIT/ML IV SOLN
500.0000 [IU] | Freq: Once | INTRAVENOUS | Status: AC
  Administered 2017-08-27: 500 [IU] via INTRAVENOUS
  Filled 2017-08-27: qty 5

## 2017-08-27 MED ORDER — SODIUM CHLORIDE 0.9 % IV SOLN
Freq: Once | INTRAVENOUS | Status: DC
Start: 2017-08-27 — End: 2017-08-27
  Administered 2017-08-27: 15:00:00 via INTRAVENOUS

## 2017-08-27 NOTE — Progress Notes (Signed)
Hematology and Oncology Follow Up Visit  Jessica [name] [id number] [birth year] 76 y.o. 08/27/2017   Principle Diagnosis:  Stage IIA (T2bN0M0) invasive ductal ca of the LEFT breast - TRIPLE NEGATIVE Staphylococcus aureus wound infection of the LEFT axilla Cellulitis of the left breast  Current Therapy:   Taxotere/Carboplatin - s/p cycle 3    Interim History:  Jessica [name] is here today with her daughter for follow-up. The cellulitis of the left breast appears to have resolved. She has no rash, mass or lesion present on exam today. She is finishing up her Doxycycline with 2 days left. She did develop thrush and her PCP started her on Diflucan. This has almost completely resolved.  She states that things still don't always taste right. Her appetite comes and goes.  She admits that she has not been hydrating well. She states that she has been saying silly things all day and is easily aggravated. Her CMP is reflective of some dehydration and hypokalemia.  No fever, chills, n.v, cough, rash, dizziness, SOB, chest pain, palpitations, abdominal pain or changes in bowel or bladder habits.  No swelling, tenderness, numbness or tingling in her extremities. No c/o pain.   ECOG Performance Status: 2 - Symptomatic, <50% confined to bed  Medications:  Allergies as of 08/27/2017      Reactions   Codeine Hives   Penicillins Hives   Has patient had a PCN reaction causing immediate rash, facial/tongue/throat swelling, SOB or lightheadedness with hypotension: yes Has patient had a PCN reaction causing severe rash involving mucus membranes or skin necrosis:no Has patient had a PCN reaction that required hospitalization:no Has patient had a PCN reaction occurring within the last 10 years: no If all of the above answers are "NO", then may proceed with Cephalosporin use. Has patient had a PCN reaction causing immediate rash, facial/tongue/throat swelling, SOB or lightheadedness with hypotension:  yes Has patient had a PCN reaction causing severe rash involving mucus membranes or skin necrosis:  no Has patient had a PCN reaction that required hospitalization:  no Has patient had a PCN reaction occurring within the last 10 years: no If all of the above answers are "NO", then may proceed with Cephalosporin use.   Advil [ibuprofen] Hives, Itching   Band-aid Plus Antibiotic [bacitracin-polymyxin B] Dermatitis   Benazepril Swelling   Possible cause of tongue swelling      Medication List       Accurate as of 08/27/17  2:15 PM. Always use your most recent med list.          calcium citrate-vitamin D 315-200 MG-UNIT tablet Commonly known as:  CITRACAL+D Take 1 tablet by mouth daily.   dexamethasone 4 MG tablet Commonly known as:  DECADRON   diphenhydramine-acetaminophen 25-500 MG Tabs tablet Commonly known as:  TYLENOL PM Take 1 tablet by mouth at bedtime as needed (sleep).   doxycycline 100 MG tablet Commonly known as:  VIBRA-TABS Take 1 tablet (100 mg total) by mouth 2 (two) times daily.   hydrochlorothiazide 12.5 MG capsule Commonly known as:  MICROZIDE Take 12.5 mg by mouth daily.   levothyroxine 75 MCG tablet Commonly known as:  SYNTHROID, LEVOTHROID Take 75 mcg by mouth daily.   lidocaine-prilocaine cream Commonly known as:  EMLA Apply 1 application topically as needed.   LORazepam 0.5 MG tablet Commonly known as:  ATIVAN Take 1 tablet (0.5 mg total) by mouth every 6 (six) hours as needed (Nausea or vomiting).   Lysine 1000 MG Tabs Take 1,000 mg  by mouth as needed.   multivitamin tablet Take 1 tablet by mouth daily.   omeprazole 20 MG capsule Commonly known as:  PRILOSEC   ondansetron 8 MG tablet Commonly known as:  ZOFRAN Take 1 tablet (8 mg total) by mouth 2 (two) times daily as needed for refractory nausea / vomiting. Start on day 3 after chemo.   OVER THE COUNTER MEDICATION Take 15 mLs by mouth 2 (two) times daily as needed (pain).    polyvinyl alcohol 1.4 % ophthalmic solution Commonly known as:  LIQUIFILM TEARS 1 drop as needed for dry eyes.   prochlorperazine 10 MG tablet Commonly known as:  COMPAZINE TAKE 1 TABLET(10 MG) BY MOUTH EVERY 6 HOURS AS NEEDED FOR NAUSEA OR VOMITING   venlafaxine XR 75 MG 24 hr capsule Commonly known as:  EFFEXOR-XR Take 225 mg by mouth daily.       Allergies:  Allergies  Allergen Reactions  . Codeine Hives  . Penicillins Hives    Has patient had a PCN reaction causing immediate rash, facial/tongue/throat swelling, SOB or lightheadedness with hypotension: yes Has patient had a PCN reaction causing severe rash involving mucus membranes or skin necrosis:no Has patient had a PCN reaction that required hospitalization:no Has patient had a PCN reaction occurring within the last 10 years: no If all of the above answers are "NO", then may proceed with Cephalosporin use. Has patient had a PCN reaction causing immediate rash, facial/tongue/throat swelling, SOB or lightheadedness with hypotension: yes Has patient had a PCN reaction causing severe rash involving mucus membranes or skin necrosis:  no Has patient had a PCN reaction that required hospitalization:  no Has patient had a PCN reaction occurring within the last 10 years: no If all of the above answers are "NO", then may proceed with Cephalosporin use.   . Advil [Ibuprofen] Hives and Itching  . Band-Aid Plus Antibiotic [Bacitracin-Polymyxin B] Dermatitis  . Benazepril Swelling    Possible cause of tongue swelling    Past Medical History, Surgical history, Social history, and Family History were reviewed and updated.  Review of Systems: All other 10 point review of systems is negative.   Physical Exam:  weight is 160 lb (72.6 kg). Her oral temperature is 97.8 F (36.6 C). Her blood pressure is 146/68 (abnormal) and her pulse is 83. Her respiration is 16 and oxygen saturation is 99%.   Wt Readings from Last 3  Encounters:  08/27/17 160 lb (72.6 kg)  08/19/17 164 lb (74.4 kg)  07/29/17 163 lb (73.9 kg)    Ocular: Sclerae unicteric, pupils equal, round and reactive to light Ear-nose-throat: Oropharynx clear, dentition fair Lymphatic: No cervical, supraclavicular or axillary adenopathy, let axilla still a little tender from previous infection Lungs no rales or rhonchi, good excursion bilaterally Heart regular rate and rhythm, no murmur appreciated Abd soft, nontender, positive bowel sounds MSK no focal spinal tenderness, no joint edema Neuro: non-focal, well-oriented, appropriate affect Breasts: Lumpectomy at the 1-2 o'clock position intact as well as the left axillary incision site. No mass, lesion or rash present on exam.   Lab Results  Component Value Date   WBC 7.5 08/27/2017   HGB 12.0 08/27/2017   HCT 36.2 08/27/2017   MCV 99 08/27/2017   PLT 230 08/27/2017   No results found for: FERRITIN, IRON, TIBC, UIBC, IRONPCTSAT Lab Results  Component Value Date   RBC 3.65 (L) 08/27/2017   No results found for: KPAFRELGTCHN, LAMBDASER, KAPLAMBRATIO No results found for: IGGSERUM, IGA, IGMSERUM No  results found for: Odetta Pink, SPEI   Chemistry      Component Value Date/Time   NA 141 08/19/2017 0956   NA 141 06/16/2017 1014   K 3.4 08/19/2017 0956   K 3.8 06/16/2017 1014   CL 101 08/19/2017 0956   CO2 29 08/19/2017 0956   CO2 31 (H) 06/16/2017 1014   BUN 11 08/19/2017 0956   BUN 8.9 06/16/2017 1014   CREATININE 0.8 08/19/2017 0956   CREATININE 0.8 06/16/2017 1014      Component Value Date/Time   CALCIUM 9.9 08/19/2017 0956   CALCIUM 10.1 06/16/2017 1014   ALKPHOS 60 08/19/2017 0956   ALKPHOS 64 06/16/2017 1014   AST 22 08/19/2017 0956   AST 21 06/16/2017 1014   ALT 20 08/19/2017 0956   ALT 20 06/16/2017 1014   BILITOT 0.40 08/19/2017 0956   BILITOT 0.37 06/16/2017 1014      Impression and Plan: Ms. Barnett is a  very pleasant 76 yo caucasian female with stage IIA ductal carcinoma of the left breast, triple negative. She is doing well so far with treatment but unfortunately at her last appointment had developed a left breast cellulitis. She has 2 days let on her Doxycycline and the cellulitis appears to have resolved.  She is on Diflucan as well for thrush.  She is feeling "silly" and her lab work reflects dehydration so we will give her fluids today while she is here.  She also has potassium of 2.9. We will give her 40 meq of KDUR now and send her home on 40 meq daily for 7 days.  We will see her back again on November 8th for follow-up and treatment.  She will contact our office with any questions or concerns. We can certainly see her sooner if need be.   Eliezer Bottom, NP 10/26/20182:15 PM

## 2017-08-27 NOTE — Patient Instructions (Signed)
Dehydration, Adult Dehydration is when there is not enough fluid or water in your body. This happens when you lose more fluids than you take in. Dehydration can range from mild to very bad. It should be treated right away to keep it from getting very bad. Symptoms of mild dehydration may include:  Thirst.  Dry lips.  Slightly dry mouth.  Dry, warm skin.  Dizziness. Symptoms of moderate dehydration may include:  Very dry mouth.  Muscle cramps.  Dark pee (urine). Pee may be the color of tea.  Your body making less pee.  Your eyes making fewer tears.  Heartbeat that is uneven or faster than normal (palpitations).  Headache.  Light-headedness, especially when you stand up from sitting.  Fainting (syncope). Symptoms of very bad dehydration may include:  Changes in skin, such as: ? Cold and clammy skin. ? Blotchy (mottled) or pale skin. ? Skin that does not quickly return to normal after being lightly pinched and let go (poor skin turgor).  Changes in body fluids, such as: ? Feeling very thirsty. ? Your eyes making fewer tears. ? Not sweating when body temperature is high, such as in hot weather. ? Your body making very little pee.  Changes in vital signs, such as: ? Weak pulse. ? Pulse that is more than 100 beats a minute when you are sitting still. ? Fast breathing. ? Low blood pressure.  Other changes, such as: ? Sunken eyes. ? Cold hands and feet. ? Confusion. ? Lack of energy (lethargy). ? Trouble waking up from sleep. ? Short-term weight loss. ? Unconsciousness. Follow these instructions at home:  If told by your doctor, drink an ORS: ? Make an ORS by using instructions on the package. ? Start by drinking small amounts, about  cup (120 mL) every 5-10 minutes. ? Slowly drink more until you have had the amount that your doctor said to have.  Drink enough clear fluid to keep your pee clear or pale yellow. If you were told to drink an ORS, finish the ORS  first, then start slowly drinking clear fluids. Drink fluids such as: ? Water. Do not drink only water by itself. Doing that can make the salt (sodium) level in your body get too low (hyponatremia). ? Ice chips. ? Fruit juice that you have added water to (diluted). ? Low-calorie sports drinks.  Avoid: ? Alcohol. ? Drinks that have a lot of sugar. These include high-calorie sports drinks, fruit juice that does not have water added, and soda. ? Caffeine. ? Foods that are greasy or have a lot of fat or sugar.  Take over-the-counter and prescription medicines only as told by your doctor.  Do not take salt tablets. Doing that can make the salt level in your body get too high (hypernatremia).  Eat foods that have minerals (electrolytes). Examples include bananas, oranges, potatoes, tomatoes, and spinach.  Keep all follow-up visits as told by your doctor. This is important. Contact a doctor if:  You have belly (abdominal) pain that: ? Gets worse. ? Stays in one area (localizes).  You have a rash.  You have a stiff neck.  You get angry or annoyed more easily than normal (irritability).  You are more sleepy than normal.  You have a harder time waking up than normal.  You feel: ? Weak. ? Dizzy. ? Very thirsty.  You have peed (urinated) only a small amount of very dark pee during 6-8 hours. Get help right away if:  You have symptoms of   very bad dehydration.  You cannot drink fluids without throwing up (vomiting).  Your symptoms get worse with treatment.  You have a fever.  You have a very bad headache.  You are throwing up or having watery poop (diarrhea) and it: ? Gets worse. ? Does not go away.  You have blood or something green (bile) in your throw-up.  You have blood in your poop (stool). This may cause poop to look black and tarry.  You have not peed in 6-8 hours.  You pass out (faint).  Your heart rate when you are sitting still is more than 100 beats a  minute.  You have trouble breathing. This information is not intended to replace advice given to you by your health care provider. Make sure you discuss any questions you have with your health care provider. Document Released: 08/15/2009 Document Revised: 05/08/2016 Document Reviewed: 12/13/2015 Elsevier Interactive Patient Education  2018 [employer]  

## 2017-09-09 ENCOUNTER — Other Ambulatory Visit (HOSPITAL_BASED_OUTPATIENT_CLINIC_OR_DEPARTMENT_OTHER): Payer: Medicare Other

## 2017-09-09 ENCOUNTER — Other Ambulatory Visit: Payer: Medicare Other

## 2017-09-09 ENCOUNTER — Ambulatory Visit (HOSPITAL_BASED_OUTPATIENT_CLINIC_OR_DEPARTMENT_OTHER): Payer: Medicare Other

## 2017-09-09 ENCOUNTER — Other Ambulatory Visit: Payer: Self-pay | Admitting: *Deleted

## 2017-09-09 ENCOUNTER — Ambulatory Visit: Payer: Medicare Other

## 2017-09-09 ENCOUNTER — Telehealth: Payer: Self-pay | Admitting: *Deleted

## 2017-09-09 ENCOUNTER — Other Ambulatory Visit: Payer: Self-pay

## 2017-09-09 ENCOUNTER — Ambulatory Visit (HOSPITAL_BASED_OUTPATIENT_CLINIC_OR_DEPARTMENT_OTHER): Payer: Medicare Other | Admitting: Hematology & Oncology

## 2017-09-09 ENCOUNTER — Encounter: Payer: Self-pay | Admitting: Hematology & Oncology

## 2017-09-09 VITALS — BP 151/64 | HR 79 | Temp 97.6°F | Resp 18 | Wt 163.0 lb

## 2017-09-09 DIAGNOSIS — E876 Hypokalemia: Secondary | ICD-10-CM

## 2017-09-09 DIAGNOSIS — Z5111 Encounter for antineoplastic chemotherapy: Secondary | ICD-10-CM

## 2017-09-09 DIAGNOSIS — Z171 Estrogen receptor negative status [ER-]: Secondary | ICD-10-CM | POA: Diagnosis not present

## 2017-09-09 DIAGNOSIS — E860 Dehydration: Secondary | ICD-10-CM

## 2017-09-09 DIAGNOSIS — C50212 Malignant neoplasm of upper-inner quadrant of left female breast: Secondary | ICD-10-CM

## 2017-09-09 LAB — CBC WITH DIFFERENTIAL (CANCER CENTER ONLY)
BASO#: 0 10*3/uL (ref 0.0–0.2)
BASO%: 0.1 % (ref 0.0–2.0)
EOS%: 0 % (ref 0.0–7.0)
Eosinophils Absolute: 0 10*3/uL (ref 0.0–0.5)
HCT: 34 % — ABNORMAL LOW (ref 34.8–46.6)
HGB: 11.3 g/dL — ABNORMAL LOW (ref 11.6–15.9)
LYMPH#: 1.7 10*3/uL (ref 0.9–3.3)
LYMPH%: 24.5 % (ref 14.0–48.0)
MCH: 33.2 pg (ref 26.0–34.0)
MCHC: 33.2 g/dL (ref 32.0–36.0)
MCV: 100 fL (ref 81–101)
MONO#: 0.6 10*3/uL (ref 0.1–0.9)
MONO%: 8.7 % (ref 0.0–13.0)
NEUT#: 4.6 10*3/uL (ref 1.5–6.5)
NEUT%: 66.7 % (ref 39.6–80.0)
Platelets: 253 10*3/uL (ref 145–400)
RBC: 3.4 10*6/uL — ABNORMAL LOW (ref 3.70–5.32)
RDW: 15.6 % (ref 11.1–15.7)
WBC: 6.9 10*3/uL (ref 3.9–10.0)

## 2017-09-09 LAB — CMP (CANCER CENTER ONLY)
ALT(SGPT): 23 U/L (ref 10–47)
AST: 19 U/L (ref 11–38)
Albumin: 3.5 g/dL (ref 3.3–5.5)
Alkaline Phosphatase: 54 U/L (ref 26–84)
BUN, Bld: 12 mg/dL (ref 7–22)
CO2: 32 mEq/L (ref 18–33)
Calcium: 9.7 mg/dL (ref 8.0–10.3)
Chloride: 101 mEq/L (ref 98–108)
Creat: 1 mg/dl (ref 0.6–1.2)
Glucose, Bld: 116 mg/dL (ref 73–118)
Potassium: 2.6 mEq/L — CL (ref 3.3–4.7)
Sodium: 137 mEq/L (ref 128–145)
Total Bilirubin: 0.6 mg/dl (ref 0.20–1.60)
Total Protein: 6.6 g/dL (ref 6.4–8.1)

## 2017-09-09 MED ORDER — DEXAMETHASONE SODIUM PHOSPHATE 10 MG/ML IJ SOLN
INTRAMUSCULAR | Status: AC
  Filled 2017-09-09: qty 1

## 2017-09-09 MED ORDER — SODIUM CHLORIDE 0.9% FLUSH
10.0000 mL | INTRAVENOUS | Status: DC | PRN
  Administered 2017-09-09: 10 mL
  Filled 2017-09-09: qty 10

## 2017-09-09 MED ORDER — POTASSIUM CHLORIDE CRYS ER 20 MEQ PO TBCR
40.0000 meq | EXTENDED_RELEASE_TABLET | Freq: Once | ORAL | Status: AC
  Administered 2017-09-09: 40 meq via ORAL
  Filled 2017-09-09: qty 2

## 2017-09-09 MED ORDER — POTASSIUM CHLORIDE CRYS ER 20 MEQ PO TBCR: 40.0000 meq | EXTENDED_RELEASE_TABLET | Freq: Every day | ORAL | 3 refills | Status: DC

## 2017-09-09 MED ORDER — SODIUM CHLORIDE 0.9 % IV SOLN
Freq: Once | INTRAVENOUS | Status: AC
  Administered 2017-09-09: 12:00:00 via INTRAVENOUS

## 2017-09-09 MED ORDER — PALONOSETRON HCL INJECTION 0.25 MG/5ML
0.2500 mg | Freq: Once | INTRAVENOUS | Status: AC
  Administered 2017-09-09: 0.25 mg via INTRAVENOUS

## 2017-09-09 MED ORDER — DEXAMETHASONE SODIUM PHOSPHATE 10 MG/ML IJ SOLN
10.0000 mg | Freq: Once | INTRAMUSCULAR | Status: AC
  Administered 2017-09-09: 10 mg via INTRAVENOUS

## 2017-09-09 MED ORDER — CARBOPLATIN CHEMO INJECTION 600 MG/60ML
400.0000 mg | Freq: Once | INTRAVENOUS | Status: AC
  Administered 2017-09-09: 400 mg via INTRAVENOUS
  Filled 2017-09-09: qty 40

## 2017-09-09 MED ORDER — PEGFILGRASTIM 6 MG/0.6ML ~~LOC~~ PSKT
PREFILLED_SYRINGE | SUBCUTANEOUS | Status: AC
  Filled 2017-09-09: qty 0.6

## 2017-09-09 MED ORDER — PEGFILGRASTIM 6 MG/0.6ML ~~LOC~~ PSKT
6.0000 mg | PREFILLED_SYRINGE | Freq: Once | SUBCUTANEOUS | Status: AC
  Administered 2017-09-09: 6 mg via SUBCUTANEOUS

## 2017-09-09 MED ORDER — PALONOSETRON HCL INJECTION 0.25 MG/5ML
INTRAVENOUS | Status: AC
  Filled 2017-09-09: qty 5

## 2017-09-09 MED ORDER — HEPARIN SOD (PORK) LOCK FLUSH 100 UNIT/ML IV SOLN
500.0000 [IU] | Freq: Once | INTRAVENOUS | Status: AC | PRN
  Administered 2017-09-09: 500 [IU]
  Filled 2017-09-09: qty 5

## 2017-09-09 MED ORDER — DOCETAXEL CHEMO INJECTION 160 MG/16ML
67.5000 mg/m2 | Freq: Once | INTRAVENOUS | Status: AC
  Administered 2017-09-09: 120 mg via INTRAVENOUS
  Filled 2017-09-09: qty 12

## 2017-09-09 NOTE — Telephone Encounter (Signed)
Critical Value Potassium 2.6 Dr Ennever notified. No orders at this time.  

## 2017-09-09 NOTE — Patient Instructions (Signed)
[place] Discharge Instructions for Patients Receiving Chemotherapy  Today you received the following chemotherapy agents Taxotere/Carboplatin To help prevent nausea and vomiting after your treatment, we encourage you to take your nausea medication as prescribed.  NO ZOFRAN FOR 3 DAYS - TAKE COMPAZINE INSTEAD THOSE 3 DAYS.  ATIVAN CAN BE USED AT ANYTIME IF THE COMPAZINE OR ZOFRAN ARE NOT WORKING.   If you develop nausea and vomiting that is not controlled by your nausea medication, call the clinic.   BELOW ARE SYMPTOMS THAT SHOULD BE REPORTED IMMEDIATELY:  *FEVER GREATER THAN 100.5 F  *CHILLS WITH OR WITHOUT FEVER  NAUSEA AND VOMITING THAT IS NOT CONTROLLED WITH YOUR NAUSEA MEDICATION  *UNUSUAL SHORTNESS OF BREATH  *UNUSUAL BRUISING OR BLEEDING  TENDERNESS IN MOUTH AND THROAT WITH OR WITHOUT PRESENCE OF ULCERS  *URINARY PROBLEMS  *BOWEL PROBLEMS  UNUSUAL RASH Items with * indicate a potential emergency and should be followed up as soon as possible.  Feel free to call the clinic should you have any questions or concerns. The clinic phone number is [phone].  Please show the [place] at check-in to the Emergency Department and triage nurse.

## 2017-09-09 NOTE — Progress Notes (Signed)
Cystograms  Hematology and Oncology Follow Up Visit  Jessica [name] [id number] 05-Jan-1941 76 y.o. 09/09/2017   Principle Diagnosis:   Stage IIA (T2bN0M0) invasive ductal ca of the LEFT breast - TRIPLE NEGATIVE  Staphylococcus aureus wound infection of the LEFT axilla  Cellulitis of the left breast  Current Therapy:    Taxotere/Carboplatin - s/p cycle #3 -complete adjuvant therapy today     Interim History:  Jessica [name] is back for follow-up.  She is doing well.  The cellulitis with the left breast has resolved quite nicely.  She has had no nausea or vomiting.  She has marked hypokalemia.  She will be put back on potassium.  She has had no fever.  She has had no cough or shortness of breath.  She has had no nausea or vomiting.  She has had no diarrhea.  She has had no leg swelling.  Overall, I think she really has done well with chemotherapy.  I do not think that she really has a lot of side effects to deal with.    I think her so much for being able to go out and see my mother-in-law at her home.  This really made my mother-in-law happy.  Overall, her performance status is ECOG 1.  Medications:  Current Outpatient Medications:  .  calcium citrate-vitamin D (CITRACAL+D) 315-200 MG-UNIT per tablet, Take 1 tablet by mouth daily., Disp: , Rfl:  .  dexamethasone (DECADRON) 4 MG tablet, , Disp: , Rfl:  .  diphenhydramine-acetaminophen (TYLENOL PM) 25-500 MG TABS, Take 1 tablet by mouth at bedtime as needed (sleep). , Disp: , Rfl:  .  doxycycline (VIBRA-TABS) 100 MG tablet, Take 1 tablet (100 mg total) by mouth 2 (two) times daily., Disp: 20 tablet, Rfl: 0 .  FLUAD 0.5 ML SUSY, ADM 0.5ML IM UTD, Disp: , Rfl: 0 .  fluconazole (DIFLUCAN) 100 MG tablet, TK 1 T PO QD FOR 7 DAYS, Disp: , Rfl: 0 .  hydrochlorothiazide (MICROZIDE) 12.5 MG capsule, Take 12.5 mg by mouth daily., Disp: , Rfl: 1 .  levothyroxine (SYNTHROID, LEVOTHROID) 75 MCG tablet, Take 75 mcg by mouth daily., Disp: ,  Rfl:  .  lidocaine-prilocaine (EMLA) cream, Apply 1 application topically as needed., Disp: 30 g, Rfl: 6 .  LORazepam (ATIVAN) 0.5 MG tablet, Take 1 tablet (0.5 mg total) by mouth every 6 (six) hours as needed (Nausea or vomiting)., Disp: 30 tablet, Rfl: 0 .  Lysine 1000 MG TABS, Take 1,000 mg by mouth as needed., Disp: , Rfl:  .  Multiple Vitamin (MULTIVITAMIN) tablet, Take 1 tablet by mouth daily., Disp: , Rfl:  .  omeprazole (PRILOSEC) 20 MG capsule, , Disp: , Rfl:  .  ondansetron (ZOFRAN) 8 MG tablet, Take 1 tablet (8 mg total) by mouth 2 (two) times daily as needed for refractory nausea / vomiting. Start on day 3 after chemo., Disp: 30 tablet, Rfl: 1 .  OVER THE COUNTER MEDICATION, Take 15 mLs by mouth 2 (two) times daily as needed (pain)., Disp: , Rfl:  .  oxyCODONE (OXY IR/ROXICODONE) 5 MG immediate release tablet, Take 5 mg every 4 (four) hours as needed by mouth. for pain, Disp: , Rfl: 0 .  polyvinyl alcohol (LIQUIFILM TEARS) 1.4 % ophthalmic solution, 1 drop as needed for dry eyes., Disp: , Rfl:  .  potassium chloride SA (K-DUR,KLOR-CON) 20 MEQ tablet, Take 2 tablets (40 mEq total) by mouth daily., Disp: 14 tablet, Rfl: 0 .  prochlorperazine (COMPAZINE) 10 MG tablet, TAKE  1 TABLET(10 MG) BY MOUTH EVERY 6 HOURS AS NEEDED FOR NAUSEA OR VOMITING, Disp: 385 tablet, Rfl: 1 .  venlafaxine XR (EFFEXOR-XR) 75 MG 24 hr capsule, Take 225 mg by mouth daily., Disp: , Rfl:   Allergies:  Allergies  Allergen Reactions  . Codeine Hives  . Penicillins Hives    Has patient had a PCN reaction causing immediate rash, facial/tongue/throat swelling, SOB or lightheadedness with hypotension: yes Has patient had a PCN reaction causing severe rash involving mucus membranes or skin necrosis:no Has patient had a PCN reaction that required hospitalization:no Has patient had a PCN reaction occurring within the last 10 years: no If all of the above answers are "NO", then may proceed with Cephalosporin  use. Has patient had a PCN reaction causing immediate rash, facial/tongue/throat swelling, SOB or lightheadedness with hypotension: yes Has patient had a PCN reaction causing severe rash involving mucus membranes or skin necrosis:  no Has patient had a PCN reaction that required hospitalization:  no Has patient had a PCN reaction occurring within the last 10 years: no If all of the above answers are "NO", then may proceed with Cephalosporin use.   . Advil [Ibuprofen] Hives and Itching  . Band-Aid Plus Antibiotic [Bacitracin-Polymyxin B] Dermatitis  . Benazepril Swelling    Possible cause of tongue swelling  . Other Rash    Band-aid    Past Medical History, Surgical history, Social history, and Family History were reviewed and updated.  Review of Systems: As stated in the interim history  Physical Exam:  weight is 163 lb (73.9 kg). Her oral temperature is 97.6 F (36.4 C). Her blood pressure is 151/64 (abnormal) and her pulse is 79. Her respiration is 18 and oxygen saturation is 96%.   Wt Readings from Last 3 Encounters:  09/09/17 163 lb (73.9 kg)  08/27/17 160 lb (72.6 kg)  08/19/17 164 lb (74.4 kg)     Well-developed well-nourished white female in no obvious distress.  Head and neck exam shows no ocular or oral lesions.  There are no palpable cervical or supraclavicular lymph nodes.  Lungs are clear bilaterally.  Cardiac exam regular rate and rhythm with no murmurs, rubs or bruits.  Breast exam shows right breast with no masses, edema or erythema.  There is no right axillary adenopathy.  Left breast shows well-healed lumpectomy scar at the 3 o'clock position.  This is at the edge of the areola.  There is some slight erythema in the upper portion of the left breast.  There is no tenderness.  There is no swelling.  There is no left axillary adenopathy.  Abdomen is soft.  She has good bowel sounds.  There is no fluid wave.  There is no palpable abdominal mass.  There is no palpable liver  or spleen tip.  Extremities shows no clubbing, cyanosis or edema.  She has no lymphedema in the left arm.  Skin exam shows no rashes, erythema or edema.  She has no ecchymoses or petechia.  Neurological exam is nonfocal.  Lab Results  Component Value Date   WBC 6.9 09/09/2017   HGB 11.3 (L) 09/09/2017   HCT 34.0 (L) 09/09/2017   MCV 100 09/09/2017   PLT 253 09/09/2017     Chemistry      Component Value Date/Time   NA 137 09/09/2017 0936   NA 141 06/16/2017 1014   K 2.6 (LL) 09/09/2017 0936   K 3.8 06/16/2017 1014   CL 101 09/09/2017 0936   CO2 32  09/09/2017 0936   CO2 31 (H) 06/16/2017 1014   BUN 12 09/09/2017 0936   BUN 8.9 06/16/2017 1014   CREATININE 1.0 09/09/2017 0936   CREATININE 0.8 06/16/2017 1014      Component Value Date/Time   CALCIUM 9.7 09/09/2017 0936   CALCIUM 10.1 06/16/2017 1014   ALKPHOS 54 09/09/2017 0936   ALKPHOS 64 06/16/2017 1014   AST 19 09/09/2017 0936   AST 21 06/16/2017 1014   ALT 23 09/09/2017 0936   ALT 20 06/16/2017 1014   BILITOT 0.60 09/09/2017 0936   BILITOT 0.37 06/16/2017 1014       Impression and Plan: Ms. Whitehead is a 76 year old postmenopausal white female with a triple negative stage IIA ductal carcinoma of the left breast.  At this point, she will complete her adjuvant chemotherapy.  It is taken as such a long time to complete this because of complications.  She will need radiation therapy.  I have called Dr. Sondra Come of radiation oncology about this today.  There is no issues with hormonal therapy because of her triple negative status.  I do not see that she needs any scans.  I would like to see her back in 1 month.  We will check her labs.  I am just glad that she will finally finish her adjuvant therapy today.  [name], MD 11/8/201811:28 AM

## 2017-09-13 ENCOUNTER — Other Ambulatory Visit: Payer: Self-pay | Admitting: Hematology & Oncology

## 2017-09-13 ENCOUNTER — Other Ambulatory Visit: Payer: Self-pay | Admitting: *Deleted

## 2017-09-13 MED ORDER — LACTULOSE 10 GM/15ML PO SOLN: 20.0000 g | ORAL | 3 refills | Status: DC | PRN

## 2017-09-16 ENCOUNTER — Encounter: Payer: Self-pay | Admitting: Radiation Oncology

## 2017-09-17 ENCOUNTER — Telehealth: Payer: Self-pay | Admitting: *Deleted

## 2017-09-17 NOTE — Telephone Encounter (Signed)
Patient states she experienced several instances of sharp pains to her L breast during the night. This is the breast that she has had surgery to. This morning the pain is gone.   Spoke with Laverna Peace NP. Pain can be a common occurrence for a period of time after surgery. She would like patient to monitor. If frequency increases or increases in severity she would like patient to notify us, but otherwise just observe for any other symptoms.  Patient understands.

## 2017-09-20 ENCOUNTER — Encounter: Payer: Self-pay | Admitting: Radiation Oncology

## 2017-09-27 ENCOUNTER — Ambulatory Visit
Admission: RE | Admit: 2017-09-27 | Discharge: 2017-09-27 | Disposition: A | Discharge status: Home / Self Care | Payer: Medicare Other | Source: Ambulatory Visit | Attending: Radiation Oncology | Admitting: Radiation Oncology

## 2017-09-27 ENCOUNTER — Other Ambulatory Visit: Payer: Self-pay

## 2017-09-27 ENCOUNTER — Encounter: Payer: Self-pay | Admitting: Radiation Oncology

## 2017-09-27 DIAGNOSIS — C50212 Malignant neoplasm of upper-inner quadrant of left female breast: Secondary | ICD-10-CM

## 2017-09-27 DIAGNOSIS — Z90710 Acquired absence of both cervix and uterus: Secondary | ICD-10-CM | POA: Insufficient documentation

## 2017-09-27 DIAGNOSIS — Z888 Allergy status to other drugs, medicaments and biological substances status: Secondary | ICD-10-CM | POA: Diagnosis not present

## 2017-09-27 DIAGNOSIS — Z171 Estrogen receptor negative status [ER-]: Secondary | ICD-10-CM | POA: Insufficient documentation

## 2017-09-27 DIAGNOSIS — Z79891 Long term (current) use of opiate analgesic: Secondary | ICD-10-CM | POA: Diagnosis not present

## 2017-09-27 DIAGNOSIS — Z9889 Other specified postprocedural states: Secondary | ICD-10-CM | POA: Diagnosis not present

## 2017-09-27 DIAGNOSIS — Z9221 Personal history of antineoplastic chemotherapy: Secondary | ICD-10-CM | POA: Diagnosis not present

## 2017-09-27 DIAGNOSIS — Z808 Family history of malignant neoplasm of other organs or systems: Secondary | ICD-10-CM | POA: Diagnosis not present

## 2017-09-27 DIAGNOSIS — Z79899 Other long term (current) drug therapy: Secondary | ICD-10-CM | POA: Insufficient documentation

## 2017-09-27 DIAGNOSIS — N644 Mastodynia: Secondary | ICD-10-CM | POA: Diagnosis not present

## 2017-09-27 NOTE — Progress Notes (Signed)
Location of Breast Cancer: upper inner quadrant of the left breast  Histology per Pathology Report:   03/23/17 Diagnosis 1. Breast, lumpectomy, Left - INVASIVE DUCTAL CARCINOMA, GRADE III/III, SPANNING 2.9 CM. - DUCTAL CARCINOMA IN SITU WITH CALCIFICATIONS, HIGH GRADE. - THE SURGICAL RESECTIONS ARE NEGATIVE FOR CARCINOMA. - SEE ONCOLOGY TABLE BELOW. 2. Breast, excision, Left additional anterior margin - BENIGN FIBROADIPOSE TISSUE. - THERE IS NO EVIDENCE OF MALIGNANCY. - SEE COMMENT. 3. Lymph node, sentinel, biopsy, Left axillary #1 - THERE IS NO EVIDENCE OF CARCINOMA IN 1 OF 1 LYMPH NODE (0/1). 4. Lymph node, sentinel, biopsy, Left axillary #2 - THERE IS NO EVIDENCE OF CARCINOMA IN 1 OF 1 LYMPH NODE (0/1). 5. Lymph node, sentinel, biopsy, Left axillary #3 - THERE IS NO EVIDENCE OF CARCINOMA IN 1 OF 1 LYMPH NODE (0/1).  02/24/17 Diagnosis Breast, left, needle core biopsy, upper inner quadrant INVASIVE DUCTAL CARCINOMA, GRADE 3 DUCTAL CARCINOMA IN SITU WITH CALCIFICATIONS AND NECROSIS, GRADE 3  Receptor Status: ER(0), PR (0), Her2-neu (negative), Ki-(70%)  Did patient present with symptoms (if so, please note symptoms) or was this found on screening mammography?: screening mammogram  Past/Anticipated interventions by surgeon, if any: 03/23/17 -Procedure: LEFT BREAST RADIOACTIVE SEED GUIDED PARTIAL MASTECTOMY AND  SENTINEL LYMPH NODE Lindsey;  Surgeon: Erroll Luna, MD   Past/Anticipated interventions by medical oncology, if any: 4 cycles of Taxotere/carboplatinum - finished on 09/09/17  Lymphedema issues, if any:  no    Pain issues, if any:  Has some tenderness under her left arm.  SAFETY ISSUES:  Prior radiation? no  Pacemaker/ICD? no  Possible current pregnancy?no  Is the patient on methotrexate? no  Other issues: Patient is here with her daughters.  BP (!) 142/86   Pulse 98   Temp 98.4 F (36.9 C) (Oral)   Resp 20   Ht '5\' 2"'$  (1.575 m)   Wt 164 lb 6.4 oz (74.6  kg)   BMI 30.07 kg/m   Wt Readings from Last 3 Encounters:  09/09/17 163 lb (73.9 kg)  08/27/17 160 lb (72.6 kg)  08/19/17 164 lb (74.4 kg)      Jacqulyn Liner, RN 09/27/2017,8:53 AM

## 2017-09-27 NOTE — Progress Notes (Signed)
Radiation Oncology         [phone] ________________________________  Name: Jessica [name] MRN: [id number]  Date: 09/27/2017  DOB: 09-Sep-1941  Re-evaluation Note  CC: [name], MD  [name], MD    ICD-10-CM   1. Malignant neoplasm of upper-inner quadrant of left breast in female, estrogen receptor negative ([place]) C50.212    Z17.1     Diagnosis:   Stage IIA (pT2, pN0) grade 3 invasive ductal carcinoma of the left (triple negative) breast  Narrative:  The patient returns today for further evaluation to discuss the role of radiotherapy in the management of her breast cancer. She is accompanied by her daughters. She has finished chemotherapy as of earlier this month. She underwent 4 cycles of Taxotere/carboplatinum - finished on 09/09/17. She reports residual fatigue from her chemotherapy, but this has been improving. She did develop an infection which required I&D of the left upper arm, this has healed well but she notes some residual tenderness over the area. This has also been improving. Of note, she does express some concerns with keeping her left arm above her head for long periods of time as she now has decreased ROM due to a rotator cuff repair several years ago. Otherwise, she is without acute complaint.                           ALLERGIES:  is allergic to codeine; penicillins; advil [ibuprofen]; band-aid plus antibiotic [bacitracin-polymyxin b]; benazepril; and other.  Meds: Current Outpatient Medications  Medication Sig Dispense Refill  . calcium citrate-vitamin D (CITRACAL+D) 315-200 MG-UNIT per tablet Take 1 tablet by mouth daily.    . diphenhydramine-acetaminophen (TYLENOL PM) 25-500 MG TABS Take 1 tablet by mouth at bedtime as needed (sleep).     . GENERLAC 10 GM/15ML SOLN TAKE 30 ML BY MOUTH EVERY 4 HOURS AS NEEDED FOR MILD CONSTIPATION UNTIL BOWEL MOVEMENT 96295 mL 3  . hydrochlorothiazide (MICROZIDE) 12.5 MG capsule Take 12.5 mg by mouth daily.  1  .  levothyroxine (SYNTHROID, LEVOTHROID) 75 MCG tablet Take 75 mcg by mouth daily.    Marland Kitchen lidocaine-prilocaine (EMLA) cream Apply 1 application topically as needed. 30 g 6  . LORazepam (ATIVAN) 0.5 MG tablet Take 1 tablet (0.5 mg total) by mouth every 6 (six) hours as needed (Nausea or vomiting). 30 tablet 0  . Lysine 1000 MG TABS Take 1,000 mg by mouth as needed.    . Multiple Vitamin (MULTIVITAMIN) tablet Take 1 tablet by mouth daily.    . ondansetron (ZOFRAN) 8 MG tablet Take 1 tablet (8 mg total) by mouth 2 (two) times daily as needed for refractory nausea / vomiting. Start on day 3 after chemo. 30 tablet 1  . OVER THE COUNTER MEDICATION Take 15 mLs by mouth 2 (two) times daily as needed (pain).    . polyvinyl alcohol (LIQUIFILM TEARS) 1.4 % ophthalmic solution 1 drop as needed for dry eyes.    . potassium chloride SA (K-DUR,KLOR-CON) 20 MEQ tablet Take 2 tablets (40 mEq total) daily by mouth. 60 tablet 3  . prochlorperazine (COMPAZINE) 10 MG tablet TAKE 1 TABLET(10 MG) BY MOUTH EVERY 6 HOURS AS NEEDED FOR NAUSEA OR VOMITING 385 tablet 1  . venlafaxine XR (EFFEXOR-XR) 75 MG 24 hr capsule Take 225 mg by mouth daily.    Marland Kitchen omeprazole (PRILOSEC) 20 MG capsule      No current facility-administered medications for this encounter.     Physical  Findings: The patient is in no acute distress. Patient is alert and oriented.  height is 5\' 2"  (1.575 m) and weight is 164 lb 6.4 oz (74.6 kg). Her oral temperature is 98.4 F (36.9 C). Her blood pressure is 142/86 (abnormal) and her pulse is 98. Her respiration is 20. .    Lungs are clear to auscultation bilaterally. Heart has regular rate and rhythm. No palpable cervical, supraclavicular, or axillary adenopathy. Abdomen soft, non-tender, normal bowel sounds.  Right breast no palpable mass or nipple discharge, She has a port-a-cath in place in the right upper chest. Left breast pt has a periareolar scar in the upper aspect of the breast. Separate scar in the  axillary region. Both have healed well. No signs of infection. No palpable seroma within the axillary region at this time.   Lab Findings: Lab Results  Component Value Date   WBC 6.9 09/09/2017   HGB 11.3 (L) 09/09/2017   HCT 34.0 (L) 09/09/2017   MCV 100 09/09/2017   PLT 253 09/09/2017    Radiographic Findings: No results found.  Impression: Stage IIA (pT2, pN0) grade 3 invasive ductal carcinoma of the left (triple negative) breast.  The patient is recovering from adjuvant chemotherapy well. She is now ready to proceed with radiation therapy. We will begin her treatment planning later this week. The risks vs benefits, side effects, and potential toxicities were discussed at great length with the patient. I informed them that they may experience possible fatigue and radiation-related skin changes within their treatment field. The patient appears to understand and wishes to proceed with planned course of treatment.   Plan:  She will undergo CT Sim later this week. I anticipate 6.5 weeks of treatment, unless she qualifies for hypofractionated treatment.   ____________________________________ -----------------------------------  Blair Promise, PhD, MD  This document serves as a record of services personally performed by Gery Pray, MD. It was created on his behalf by Reola Mosher, a trained medical scribe. The creation of this record is based on the scribe's personal observations and the provider's statements to them. This document has been checked and approved by the attending provider.

## 2017-09-30 ENCOUNTER — Ambulatory Visit: Payer: Medicare Other | Admitting: Radiation Oncology

## 2017-09-30 ENCOUNTER — Ambulatory Visit
Admission: RE | Admit: 2017-09-30 | Discharge: 2017-09-30 | Disposition: A | Discharge status: Home / Self Care | Payer: Medicare Other | Source: Ambulatory Visit | Attending: Radiation Oncology | Admitting: Radiation Oncology

## 2017-09-30 ENCOUNTER — Ambulatory Visit: Payer: Medicare Other

## 2017-09-30 DIAGNOSIS — Z171 Estrogen receptor negative status [ER-]: Secondary | ICD-10-CM

## 2017-09-30 DIAGNOSIS — Z9889 Other specified postprocedural states: Secondary | ICD-10-CM | POA: Diagnosis not present

## 2017-09-30 DIAGNOSIS — Z79899 Other long term (current) drug therapy: Secondary | ICD-10-CM | POA: Diagnosis not present

## 2017-09-30 DIAGNOSIS — Z808 Family history of malignant neoplasm of other organs or systems: Secondary | ICD-10-CM | POA: Diagnosis not present

## 2017-09-30 DIAGNOSIS — C50212 Malignant neoplasm of upper-inner quadrant of left female breast: Secondary | ICD-10-CM | POA: Diagnosis not present

## 2017-09-30 DIAGNOSIS — Z79891 Long term (current) use of opiate analgesic: Secondary | ICD-10-CM | POA: Diagnosis not present

## 2017-09-30 DIAGNOSIS — Z888 Allergy status to other drugs, medicaments and biological substances status: Secondary | ICD-10-CM | POA: Diagnosis not present

## 2017-09-30 NOTE — Progress Notes (Signed)
  Radiation Oncology         [phone] ________________________________  Name: Jessica [name] MRN: [id number]  Date: 09/30/2017  DOB: Aug 19, 1941  SIMULATION AND TREATMENT PLANNING NOTE    ICD-10-CM   1. Malignant neoplasm of upper-inner quadrant of left breast in female, estrogen receptor negative ([place]) C50.212    Z17.1     DIAGNOSIS: Stage IIA (pT2, pN0) grade 3 invasive ductal carcinoma of the left (triple negative)breast  NARRATIVE:  The patient was brought to the [facility].  Identity was confirmed.  All relevant records and images related to the planned course of therapy were reviewed.  The patient freely provided informed written consent to proceed with treatment after reviewing the details related to the planned course of therapy. The consent form was witnessed and verified by the simulation staff.  Then, the patient was set-up in a stable reproducible  supine position for radiation therapy.  CT images were obtained.  Surface markings were placed.  The CT images were loaded into the planning software.  Then the target and avoidance structures were contoured.  Treatment planning then occurred.  The radiation prescription was entered and confirmed.  Then, I designed and supervised the construction of a total of 3 medically necessary complex treatment devices.  I have requested : 3D Simulation  I have requested a DVH of the following structures: heart, lungs, lumpectomy cavity.  I have ordered:dose calc.  PLAN:  The patient will receive 40.05 Gy in 15 fractions followed by a boost to the lumpectomy cavity of 10 Gy in 5 fractions.  -----------------------------------  Blair Promise, PhD, MD  This document serves as a record of services personally performed by Gery Pray, MD. It was created on his behalf by Valeta Harms, a trained medical scribe. The creation of this record is based on the scribe's personal observations and the provider's statements to them.  This document has been checked and approved by the attending provider.

## 2017-10-04 DIAGNOSIS — Z880 Allergy status to penicillin: Secondary | ICD-10-CM | POA: Insufficient documentation

## 2017-10-04 DIAGNOSIS — Z510 Encounter for antineoplastic radiation therapy: Secondary | ICD-10-CM | POA: Insufficient documentation

## 2017-10-04 DIAGNOSIS — Z171 Estrogen receptor negative status [ER-]: Secondary | ICD-10-CM | POA: Insufficient documentation

## 2017-10-04 DIAGNOSIS — Z79890 Hormone replacement therapy (postmenopausal): Secondary | ICD-10-CM | POA: Insufficient documentation

## 2017-10-04 DIAGNOSIS — Z888 Allergy status to other drugs, medicaments and biological substances status: Secondary | ICD-10-CM | POA: Insufficient documentation

## 2017-10-04 DIAGNOSIS — Z79899 Other long term (current) drug therapy: Secondary | ICD-10-CM | POA: Insufficient documentation

## 2017-10-04 DIAGNOSIS — Z885 Allergy status to narcotic agent status: Secondary | ICD-10-CM | POA: Insufficient documentation

## 2017-10-04 DIAGNOSIS — C50212 Malignant neoplasm of upper-inner quadrant of left female breast: Secondary | ICD-10-CM | POA: Insufficient documentation

## 2017-10-05 DIAGNOSIS — Z888 Allergy status to other drugs, medicaments and biological substances status: Secondary | ICD-10-CM | POA: Diagnosis not present

## 2017-10-05 DIAGNOSIS — Z885 Allergy status to narcotic agent status: Secondary | ICD-10-CM | POA: Diagnosis not present

## 2017-10-05 DIAGNOSIS — Z171 Estrogen receptor negative status [ER-]: Secondary | ICD-10-CM | POA: Diagnosis not present

## 2017-10-05 DIAGNOSIS — Z510 Encounter for antineoplastic radiation therapy: Secondary | ICD-10-CM | POA: Diagnosis not present

## 2017-10-05 DIAGNOSIS — Z880 Allergy status to penicillin: Secondary | ICD-10-CM | POA: Diagnosis not present

## 2017-10-05 DIAGNOSIS — Z79899 Other long term (current) drug therapy: Secondary | ICD-10-CM | POA: Diagnosis not present

## 2017-10-05 DIAGNOSIS — Z79890 Hormone replacement therapy (postmenopausal): Secondary | ICD-10-CM | POA: Diagnosis not present

## 2017-10-05 DIAGNOSIS — C50212 Malignant neoplasm of upper-inner quadrant of left female breast: Secondary | ICD-10-CM | POA: Diagnosis not present

## 2017-10-07 ENCOUNTER — Ambulatory Visit
Admission: RE | Admit: 2017-10-07 | Discharge: 2017-10-07 | Disposition: A | Discharge status: Home / Self Care | Payer: Medicare Other | Source: Ambulatory Visit | Attending: Radiation Oncology | Admitting: Radiation Oncology

## 2017-10-07 DIAGNOSIS — Z79899 Other long term (current) drug therapy: Secondary | ICD-10-CM | POA: Diagnosis not present

## 2017-10-07 DIAGNOSIS — Z79890 Hormone replacement therapy (postmenopausal): Secondary | ICD-10-CM | POA: Diagnosis not present

## 2017-10-07 DIAGNOSIS — Z888 Allergy status to other drugs, medicaments and biological substances status: Secondary | ICD-10-CM | POA: Diagnosis not present

## 2017-10-07 DIAGNOSIS — C50212 Malignant neoplasm of upper-inner quadrant of left female breast: Secondary | ICD-10-CM

## 2017-10-07 DIAGNOSIS — Z171 Estrogen receptor negative status [ER-]: Secondary | ICD-10-CM | POA: Diagnosis not present

## 2017-10-07 DIAGNOSIS — Z880 Allergy status to penicillin: Secondary | ICD-10-CM | POA: Diagnosis not present

## 2017-10-07 DIAGNOSIS — Z510 Encounter for antineoplastic radiation therapy: Secondary | ICD-10-CM | POA: Diagnosis not present

## 2017-10-07 DIAGNOSIS — Z885 Allergy status to narcotic agent status: Secondary | ICD-10-CM | POA: Diagnosis not present

## 2017-10-07 NOTE — Progress Notes (Signed)
  Radiation Oncology         [phone] ________________________________  Name: Jessica [name] MRN: [id number]  Date: 10/07/2017  DOB: 1941-05-15  Simulation Verification Note    ICD-10-CM   1. Malignant neoplasm of upper-inner quadrant of left female breast, unspecified estrogen receptor status ([place]) C50.212     Status: outpatient  NARRATIVE: The patient was brought to the treatment unit and placed in the planned treatment position. The clinical setup was verified. Then port films were obtained and uploaded to the radiation oncology medical record software.  The treatment beams were carefully compared against the planned radiation fields. The position location and shape of the radiation fields was reviewed. They targeted volume of tissue appears to be appropriately covered by the radiation beams. Organs at risk appear to be excluded as planned.  Based on my personal review, I approved the simulation verification. The patient's treatment will proceed as planned.  -----------------------------------  Blair Promise, PhD, MD

## 2017-10-08 ENCOUNTER — Other Ambulatory Visit (HOSPITAL_BASED_OUTPATIENT_CLINIC_OR_DEPARTMENT_OTHER): Payer: Medicare Other

## 2017-10-08 ENCOUNTER — Ambulatory Visit (HOSPITAL_BASED_OUTPATIENT_CLINIC_OR_DEPARTMENT_OTHER): Payer: Medicare Other

## 2017-10-08 ENCOUNTER — Ambulatory Visit (HOSPITAL_BASED_OUTPATIENT_CLINIC_OR_DEPARTMENT_OTHER): Payer: Medicare Other | Admitting: Hematology & Oncology

## 2017-10-08 ENCOUNTER — Other Ambulatory Visit: Payer: Self-pay

## 2017-10-08 ENCOUNTER — Encounter: Payer: Self-pay | Admitting: Hematology & Oncology

## 2017-10-08 VITALS — BP 143/65 | HR 76 | Temp 98.5°F | Resp 18 | Wt 166.0 lb

## 2017-10-08 DIAGNOSIS — C50212 Malignant neoplasm of upper-inner quadrant of left female breast: Secondary | ICD-10-CM

## 2017-10-08 DIAGNOSIS — N61 Mastitis without abscess: Secondary | ICD-10-CM | POA: Diagnosis not present

## 2017-10-08 DIAGNOSIS — Z171 Estrogen receptor negative status [ER-]: Secondary | ICD-10-CM | POA: Diagnosis not present

## 2017-10-08 DIAGNOSIS — Z95828 Presence of other vascular implants and grafts: Secondary | ICD-10-CM

## 2017-10-08 LAB — CMP (CANCER CENTER ONLY)
ALT(SGPT): 20 U/L (ref 10–47)
AST: 20 U/L (ref 11–38)
Albumin: 3.3 g/dL (ref 3.3–5.5)
Alkaline Phosphatase: 68 U/L (ref 26–84)
BUN, Bld: 10 mg/dL (ref 7–22)
CO2: 30 meq/L (ref 18–33)
Calcium: 9.5 mg/dL (ref 8.0–10.3)
Chloride: 100 meq/L (ref 98–108)
Creat: 0.9 mg/dL (ref 0.6–1.2)
Glucose, Bld: 126 mg/dL — ABNORMAL HIGH (ref 73–118)
Potassium: 3.4 meq/L (ref 3.3–4.7)
Sodium: 143 meq/L (ref 128–145)
Total Bilirubin: 0.7 mg/dL (ref 0.20–1.60)
Total Protein: 6.7 g/dL (ref 6.4–8.1)

## 2017-10-08 LAB — CBC WITH DIFFERENTIAL (CANCER CENTER ONLY)
BASO#: 0 10*3/uL (ref 0.0–0.2)
BASO%: 0 % (ref 0.0–2.0)
EOS%: 0.2 % (ref 0.0–7.0)
Eosinophils Absolute: 0 10*3/uL (ref 0.0–0.5)
HCT: 33.6 % — ABNORMAL LOW (ref 34.8–46.6)
HGB: 10.9 g/dL — ABNORMAL LOW (ref 11.6–15.9)
LYMPH#: 0.9 10*3/uL (ref 0.9–3.3)
LYMPH%: 20.3 % (ref 14.0–48.0)
MCH: 33.3 pg (ref 26.0–34.0)
MCHC: 32.4 g/dL (ref 32.0–36.0)
MCV: 103 fL — ABNORMAL HIGH (ref 81–101)
MONO#: 0.4 10*3/uL (ref 0.1–0.9)
MONO%: 8.2 % (ref 0.0–13.0)
NEUT#: 3.2 10*3/uL (ref 1.5–6.5)
NEUT%: 71.3 % (ref 39.6–80.0)
Platelets: 228 10*3/uL (ref 145–400)
RBC: 3.27 10*6/uL — ABNORMAL LOW (ref 3.70–5.32)
RDW: 17.4 % — ABNORMAL HIGH (ref 11.1–15.7)
WBC: 4.5 10*3/uL (ref 3.9–10.0)

## 2017-10-08 MED ORDER — SODIUM CHLORIDE 0.9% FLUSH
10.0000 mL | INTRAVENOUS | Status: DC | PRN
  Administered 2017-10-08: 10 mL via INTRAVENOUS
  Filled 2017-10-08: qty 10

## 2017-10-08 MED ORDER — HEPARIN SOD (PORK) LOCK FLUSH 100 UNIT/ML IV SOLN
500.0000 [IU] | Freq: Once | INTRAVENOUS | Status: AC
  Administered 2017-10-08: 500 [IU] via INTRAVENOUS
  Filled 2017-10-08: qty 5

## 2017-10-08 NOTE — Progress Notes (Signed)
Cystograms  Hematology and Oncology Follow Up Visit  Jessica [name] [id number] Sep 07, 1941 76 y.o. 10/08/2017   Principle Diagnosis:   Stage IIA (T2bN0M0) invasive ductal ca of the LEFT breast - TRIPLE NEGATIVE  Staphylococcus aureus wound infection of the LEFT axilla  Cellulitis of the left breast  Current Therapy:    Taxotere/Carboplatin - s/p cycle #4 -completed adjuvant therapy on 09/09/2017     Interim History:  Jessica [name] is back for follow-up.  She looks great.  She will start her radiation therapy on Monday.  This will be dependent upon the amount of snow that we get.  She has had no problems since we saw her.  She is eating well.  She is getting her taste back for food.  She has had no fever.  She has had no cough.  She has had no change in bowel or bladder habits.  There is no headache.  She has had no rashes.  overall, her performance status is ECOG 1.  Medications:  Current Outpatient Medications:  .  calcium citrate-vitamin D (CITRACAL+D) 315-200 MG-UNIT per tablet, Take 1 tablet by mouth daily., Disp: , Rfl:  .  diphenhydramine-acetaminophen (TYLENOL PM) 25-500 MG TABS, Take 1 tablet by mouth at bedtime as needed (sleep). , Disp: , Rfl:  .  GENERLAC 10 GM/15ML SOLN, TAKE 30 ML BY MOUTH EVERY 4 HOURS AS NEEDED FOR MILD CONSTIPATION UNTIL BOWEL MOVEMENT, Disp: 21600 mL, Rfl: 3 .  hydrochlorothiazide (MICROZIDE) 12.5 MG capsule, Take 12.5 mg by mouth daily., Disp: , Rfl: 1 .  levothyroxine (SYNTHROID, LEVOTHROID) 75 MCG tablet, Take 75 mcg by mouth daily., Disp: , Rfl:  .  lidocaine-prilocaine (EMLA) cream, Apply 1 application topically as needed., Disp: 30 g, Rfl: 6 .  LORazepam (ATIVAN) 0.5 MG tablet, Take 1 tablet (0.5 mg total) by mouth every 6 (six) hours as needed (Nausea or vomiting)., Disp: 30 tablet, Rfl: 0 .  Lysine 1000 MG TABS, Take 1,000 mg by mouth as needed., Disp: , Rfl:  .  Multiple Vitamin (MULTIVITAMIN) tablet, Take 1 tablet by mouth daily.,  Disp: , Rfl:  .  omeprazole (PRILOSEC) 20 MG capsule, , Disp: , Rfl:  .  ondansetron (ZOFRAN) 8 MG tablet, Take 1 tablet (8 mg total) by mouth 2 (two) times daily as needed for refractory nausea / vomiting. Start on day 3 after chemo., Disp: 30 tablet, Rfl: 1 .  OVER THE COUNTER MEDICATION, Take 15 mLs by mouth 2 (two) times daily as needed (pain)., Disp: , Rfl:  .  polyvinyl alcohol (LIQUIFILM TEARS) 1.4 % ophthalmic solution, 1 drop as needed for dry eyes., Disp: , Rfl:  .  potassium chloride SA (K-DUR,KLOR-CON) 20 MEQ tablet, Take 2 tablets (40 mEq total) daily by mouth., Disp: 60 tablet, Rfl: 3 .  prochlorperazine (COMPAZINE) 10 MG tablet, TAKE 1 TABLET(10 MG) BY MOUTH EVERY 6 HOURS AS NEEDED FOR NAUSEA OR VOMITING, Disp: 385 tablet, Rfl: 1 .  venlafaxine XR (EFFEXOR-XR) 75 MG 24 hr capsule, Take 225 mg by mouth daily., Disp: , Rfl:   Allergies:  Allergies  Allergen Reactions  . Codeine Hives  . Penicillins Hives    Has patient had a PCN reaction causing immediate rash, facial/tongue/throat swelling, SOB or lightheadedness with hypotension: yes Has patient had a PCN reaction causing severe rash involving mucus membranes or skin necrosis:no Has patient had a PCN reaction that required hospitalization:no Has patient had a PCN reaction occurring within the last 10 years: no If all of  the above answers are "NO", then may proceed with Cephalosporin use. Has patient had a PCN reaction causing immediate rash, facial/tongue/throat swelling, SOB or lightheadedness with hypotension: yes Has patient had a PCN reaction causing severe rash involving mucus membranes or skin necrosis:  no Has patient had a PCN reaction that required hospitalization:  no Has patient had a PCN reaction occurring within the last 10 years: no If all of the above answers are "NO", then may proceed with Cephalosporin use.   . Advil [Ibuprofen] Hives and Itching  . Band-Aid Plus Antibiotic [Bacitracin-Polymyxin B]  Dermatitis  . Benazepril Swelling    Possible cause of tongue swelling  . Other Rash    Band-aid    Past Medical History, Surgical history, Social history, and Family History were reviewed and updated.  Review of Systems: As stated in the interim history  Physical Exam:  vitals were not taken for this visit.   Wt Readings from Last 3 Encounters:  09/27/17 164 lb 6.4 oz (74.6 kg)  09/09/17 163 lb (73.9 kg)  08/27/17 160 lb (72.6 kg)    I examined Jessica [name].  The findings on my examination are noted below:  Well-developed well-nourished white female in no obvious distress.  Head and neck exam shows no ocular or oral lesions.  There are no palpable cervical or supraclavicular lymph nodes.  Lungs are clear bilaterally.  Cardiac exam regular rate and rhythm with no murmurs, rubs or bruits.  Breast exam shows right breast with no masses, edema or erythema.  There is no right axillary adenopathy.  Left breast shows well-healed lumpectomy scar at the 3 o'clock position.  This is at the edge of the areola.  There is some slight erythema in the upper portion of the left breast.  There is no tenderness.  There is no swelling.  There is no left axillary adenopathy.  Abdomen is soft.  She has good bowel sounds.  There is no fluid wave.  There is no palpable abdominal mass.  There is no palpable liver or spleen tip.  Extremities shows no clubbing, cyanosis or edema.  She has no lymphedema in the left arm.  Skin exam shows no rashes, erythema or edema.  She has no ecchymoses or petechia.  Neurological exam is nonfocal.  Lab Results  Component Value Date   WBC 4.5 10/08/2017   HGB 10.9 (L) 10/08/2017   HCT 33.6 (L) 10/08/2017   MCV 103 (H) 10/08/2017   PLT 228 10/08/2017     Chemistry      Component Value Date/Time   NA 143 10/08/2017 1042   NA 141 06/16/2017 1014   K 3.4 10/08/2017 1042   K 3.8 06/16/2017 1014   CL 100 10/08/2017 1042   CO2 30 10/08/2017 1042   CO2 31 (H) 06/16/2017 1014    BUN 10 10/08/2017 1042   BUN 8.9 06/16/2017 1014   CREATININE 0.9 10/08/2017 1042   CREATININE 0.8 06/16/2017 1014      Component Value Date/Time   CALCIUM 9.5 10/08/2017 1042   CALCIUM 10.1 06/16/2017 1014   ALKPHOS 68 10/08/2017 1042   ALKPHOS 64 06/16/2017 1014   AST 20 10/08/2017 1042   AST 21 06/16/2017 1014   ALT 20 10/08/2017 1042   ALT 20 06/16/2017 1014   BILITOT 0.70 10/08/2017 1042   BILITOT 0.37 06/16/2017 1014       Impression and Plan: Ms. Rotan is a 76 year old postmenopausal white female with a triple negative stage IIA ductal carcinoma  of the left breast.  She completed her adjuvant chemotherapy.  I must say, that we really had a tough time with this.  Thankfully, we were able to get done.  She now will have her radiation therapy.  She is triple negative.  She will not need any adjuvant antiestrogen therapy.  We will plan to get her back in 6 more weeks.  By then, she should be getting close to finishing the radiation therapy.  I am just so happy that she has been able to complete treatment.  I just would not have drained that she we have had the difficulties that she had.    She still has her Port-A-Cath in.  I told her that we can get the Port-A-Cath taken out after completion of her radiation therapy.  [name], MD 12/7/201811:52 AM

## 2017-10-11 ENCOUNTER — Ambulatory Visit: Payer: Medicare Other

## 2017-10-12 ENCOUNTER — Ambulatory Visit: Payer: Medicare Other

## 2017-10-13 ENCOUNTER — Ambulatory Visit
Admission: RE | Admit: 2017-10-13 | Discharge: 2017-10-13 | Disposition: A | Discharge status: Home / Self Care | Payer: Medicare Other | Source: Ambulatory Visit | Attending: Radiation Oncology | Admitting: Radiation Oncology

## 2017-10-13 DIAGNOSIS — Z885 Allergy status to narcotic agent status: Secondary | ICD-10-CM | POA: Diagnosis not present

## 2017-10-13 DIAGNOSIS — C50212 Malignant neoplasm of upper-inner quadrant of left female breast: Secondary | ICD-10-CM | POA: Diagnosis not present

## 2017-10-13 DIAGNOSIS — Z888 Allergy status to other drugs, medicaments and biological substances status: Secondary | ICD-10-CM | POA: Diagnosis not present

## 2017-10-13 DIAGNOSIS — Z171 Estrogen receptor negative status [ER-]: Secondary | ICD-10-CM | POA: Diagnosis not present

## 2017-10-13 DIAGNOSIS — Z880 Allergy status to penicillin: Secondary | ICD-10-CM | POA: Diagnosis not present

## 2017-10-13 DIAGNOSIS — Z79890 Hormone replacement therapy (postmenopausal): Secondary | ICD-10-CM | POA: Diagnosis not present

## 2017-10-13 DIAGNOSIS — Z510 Encounter for antineoplastic radiation therapy: Secondary | ICD-10-CM | POA: Diagnosis not present

## 2017-10-13 DIAGNOSIS — Z79899 Other long term (current) drug therapy: Secondary | ICD-10-CM | POA: Diagnosis not present

## 2017-10-14 ENCOUNTER — Ambulatory Visit
Admission: RE | Admit: 2017-10-14 | Discharge: 2017-10-14 | Disposition: A | Discharge status: Home / Self Care | Payer: Medicare Other | Source: Ambulatory Visit | Attending: Radiation Oncology | Admitting: Radiation Oncology

## 2017-10-14 DIAGNOSIS — Z510 Encounter for antineoplastic radiation therapy: Secondary | ICD-10-CM | POA: Diagnosis not present

## 2017-10-14 DIAGNOSIS — Z885 Allergy status to narcotic agent status: Secondary | ICD-10-CM | POA: Diagnosis not present

## 2017-10-14 DIAGNOSIS — Z171 Estrogen receptor negative status [ER-]: Secondary | ICD-10-CM | POA: Diagnosis not present

## 2017-10-14 DIAGNOSIS — C50212 Malignant neoplasm of upper-inner quadrant of left female breast: Secondary | ICD-10-CM | POA: Diagnosis not present

## 2017-10-14 DIAGNOSIS — Z79890 Hormone replacement therapy (postmenopausal): Secondary | ICD-10-CM | POA: Diagnosis not present

## 2017-10-14 DIAGNOSIS — Z888 Allergy status to other drugs, medicaments and biological substances status: Secondary | ICD-10-CM | POA: Diagnosis not present

## 2017-10-14 DIAGNOSIS — Z880 Allergy status to penicillin: Secondary | ICD-10-CM | POA: Diagnosis not present

## 2017-10-14 DIAGNOSIS — Z79899 Other long term (current) drug therapy: Secondary | ICD-10-CM | POA: Diagnosis not present

## 2017-10-14 MED ORDER — RADIAPLEXRX EX GEL
Freq: Once | CUTANEOUS | Status: AC
  Administered 2017-10-14: 08:00:00 via TOPICAL

## 2017-10-14 MED ORDER — ALRA NON-METALLIC DEODORANT (RAD-ONC)
1.0000 "application " | Freq: Once | TOPICAL | Status: AC
  Administered 2017-10-14: 1 via TOPICAL

## 2017-10-14 NOTE — Progress Notes (Addendum)
Pt here for patient teaching.  Pt given Radiation and You booklet, skin care instructions, Alra deodorant and Radiaplex gel.  Reviewed areas of pertinence such as fatigue, skin changes, breast tenderness and breast swelling . Pt able to give teach back of to pat skin and use unscented/gentle soap,apply Radiaplex bid and avoid applying anything to skin within 4 hours of treatment. Pt demonstrated understanding and verbalizes understanding of information given and will contact nursing with any questions or concerns.

## 2017-10-15 ENCOUNTER — Ambulatory Visit
Admission: RE | Admit: 2017-10-15 | Discharge: 2017-10-15 | Disposition: A | Discharge status: Home / Self Care | Payer: Medicare Other | Source: Ambulatory Visit | Attending: Radiation Oncology | Admitting: Radiation Oncology

## 2017-10-15 DIAGNOSIS — Z888 Allergy status to other drugs, medicaments and biological substances status: Secondary | ICD-10-CM | POA: Diagnosis not present

## 2017-10-15 DIAGNOSIS — Z885 Allergy status to narcotic agent status: Secondary | ICD-10-CM | POA: Diagnosis not present

## 2017-10-15 DIAGNOSIS — Z79899 Other long term (current) drug therapy: Secondary | ICD-10-CM | POA: Diagnosis not present

## 2017-10-15 DIAGNOSIS — Z880 Allergy status to penicillin: Secondary | ICD-10-CM | POA: Diagnosis not present

## 2017-10-15 DIAGNOSIS — C50212 Malignant neoplasm of upper-inner quadrant of left female breast: Secondary | ICD-10-CM | POA: Diagnosis not present

## 2017-10-15 DIAGNOSIS — Z171 Estrogen receptor negative status [ER-]: Secondary | ICD-10-CM | POA: Diagnosis not present

## 2017-10-15 DIAGNOSIS — Z510 Encounter for antineoplastic radiation therapy: Secondary | ICD-10-CM | POA: Diagnosis not present

## 2017-10-15 DIAGNOSIS — Z79890 Hormone replacement therapy (postmenopausal): Secondary | ICD-10-CM | POA: Diagnosis not present

## 2017-10-16 ENCOUNTER — Ambulatory Visit
Admission: RE | Admit: 2017-10-16 | Discharge: 2017-10-16 | Disposition: A | Discharge status: Home / Self Care | Payer: Medicare Other | Source: Ambulatory Visit | Attending: Radiation Oncology | Admitting: Radiation Oncology

## 2017-10-16 DIAGNOSIS — C50212 Malignant neoplasm of upper-inner quadrant of left female breast: Secondary | ICD-10-CM | POA: Diagnosis not present

## 2017-10-16 DIAGNOSIS — Z79890 Hormone replacement therapy (postmenopausal): Secondary | ICD-10-CM | POA: Diagnosis not present

## 2017-10-16 DIAGNOSIS — Z880 Allergy status to penicillin: Secondary | ICD-10-CM | POA: Diagnosis not present

## 2017-10-16 DIAGNOSIS — Z888 Allergy status to other drugs, medicaments and biological substances status: Secondary | ICD-10-CM | POA: Diagnosis not present

## 2017-10-16 DIAGNOSIS — Z79899 Other long term (current) drug therapy: Secondary | ICD-10-CM | POA: Diagnosis not present

## 2017-10-16 DIAGNOSIS — Z885 Allergy status to narcotic agent status: Secondary | ICD-10-CM | POA: Diagnosis not present

## 2017-10-16 DIAGNOSIS — Z510 Encounter for antineoplastic radiation therapy: Secondary | ICD-10-CM | POA: Diagnosis not present

## 2017-10-16 DIAGNOSIS — Z171 Estrogen receptor negative status [ER-]: Secondary | ICD-10-CM | POA: Diagnosis not present

## 2017-10-18 ENCOUNTER — Ambulatory Visit
Admission: RE | Admit: 2017-10-18 | Discharge: 2017-10-18 | Disposition: A | Discharge status: Home / Self Care | Payer: Medicare Other | Source: Ambulatory Visit | Attending: Radiation Oncology | Admitting: Radiation Oncology

## 2017-10-18 DIAGNOSIS — Z79890 Hormone replacement therapy (postmenopausal): Secondary | ICD-10-CM | POA: Diagnosis not present

## 2017-10-18 DIAGNOSIS — Z79899 Other long term (current) drug therapy: Secondary | ICD-10-CM | POA: Diagnosis not present

## 2017-10-18 DIAGNOSIS — Z510 Encounter for antineoplastic radiation therapy: Secondary | ICD-10-CM | POA: Diagnosis not present

## 2017-10-18 DIAGNOSIS — Z885 Allergy status to narcotic agent status: Secondary | ICD-10-CM | POA: Diagnosis not present

## 2017-10-18 DIAGNOSIS — Z171 Estrogen receptor negative status [ER-]: Secondary | ICD-10-CM | POA: Diagnosis not present

## 2017-10-18 DIAGNOSIS — C50212 Malignant neoplasm of upper-inner quadrant of left female breast: Secondary | ICD-10-CM | POA: Diagnosis not present

## 2017-10-18 DIAGNOSIS — Z888 Allergy status to other drugs, medicaments and biological substances status: Secondary | ICD-10-CM | POA: Diagnosis not present

## 2017-10-18 DIAGNOSIS — Z880 Allergy status to penicillin: Secondary | ICD-10-CM | POA: Diagnosis not present

## 2017-10-19 ENCOUNTER — Ambulatory Visit
Admission: RE | Admit: 2017-10-19 | Discharge: 2017-10-19 | Disposition: A | Discharge status: Home / Self Care | Payer: Medicare Other | Source: Ambulatory Visit | Attending: Radiation Oncology | Admitting: Radiation Oncology

## 2017-10-19 DIAGNOSIS — Z888 Allergy status to other drugs, medicaments and biological substances status: Secondary | ICD-10-CM | POA: Diagnosis not present

## 2017-10-19 DIAGNOSIS — C50212 Malignant neoplasm of upper-inner quadrant of left female breast: Secondary | ICD-10-CM | POA: Diagnosis not present

## 2017-10-19 DIAGNOSIS — Z885 Allergy status to narcotic agent status: Secondary | ICD-10-CM | POA: Diagnosis not present

## 2017-10-19 DIAGNOSIS — Z510 Encounter for antineoplastic radiation therapy: Secondary | ICD-10-CM | POA: Diagnosis not present

## 2017-10-19 DIAGNOSIS — Z79899 Other long term (current) drug therapy: Secondary | ICD-10-CM | POA: Diagnosis not present

## 2017-10-19 DIAGNOSIS — Z79890 Hormone replacement therapy (postmenopausal): Secondary | ICD-10-CM | POA: Diagnosis not present

## 2017-10-19 DIAGNOSIS — Z880 Allergy status to penicillin: Secondary | ICD-10-CM | POA: Diagnosis not present

## 2017-10-19 DIAGNOSIS — Z171 Estrogen receptor negative status [ER-]: Secondary | ICD-10-CM | POA: Diagnosis not present

## 2017-10-20 ENCOUNTER — Ambulatory Visit
Admission: RE | Admit: 2017-10-20 | Discharge: 2017-10-20 | Disposition: A | Discharge status: Home / Self Care | Payer: Medicare Other | Source: Ambulatory Visit | Attending: Radiation Oncology | Admitting: Radiation Oncology

## 2017-10-20 DIAGNOSIS — C50212 Malignant neoplasm of upper-inner quadrant of left female breast: Secondary | ICD-10-CM | POA: Diagnosis not present

## 2017-10-20 DIAGNOSIS — Z79899 Other long term (current) drug therapy: Secondary | ICD-10-CM | POA: Diagnosis not present

## 2017-10-20 DIAGNOSIS — Z510 Encounter for antineoplastic radiation therapy: Secondary | ICD-10-CM | POA: Diagnosis not present

## 2017-10-20 DIAGNOSIS — Z79890 Hormone replacement therapy (postmenopausal): Secondary | ICD-10-CM | POA: Diagnosis not present

## 2017-10-20 DIAGNOSIS — Z885 Allergy status to narcotic agent status: Secondary | ICD-10-CM | POA: Diagnosis not present

## 2017-10-20 DIAGNOSIS — Z171 Estrogen receptor negative status [ER-]: Secondary | ICD-10-CM | POA: Diagnosis not present

## 2017-10-20 DIAGNOSIS — Z888 Allergy status to other drugs, medicaments and biological substances status: Secondary | ICD-10-CM | POA: Diagnosis not present

## 2017-10-20 DIAGNOSIS — Z880 Allergy status to penicillin: Secondary | ICD-10-CM | POA: Diagnosis not present

## 2017-10-21 ENCOUNTER — Ambulatory Visit
Admission: RE | Admit: 2017-10-21 | Discharge: 2017-10-21 | Disposition: A | Discharge status: Home / Self Care | Payer: Medicare Other | Source: Ambulatory Visit | Attending: Radiation Oncology | Admitting: Radiation Oncology

## 2017-10-21 DIAGNOSIS — Z171 Estrogen receptor negative status [ER-]: Secondary | ICD-10-CM | POA: Diagnosis not present

## 2017-10-21 DIAGNOSIS — Z880 Allergy status to penicillin: Secondary | ICD-10-CM | POA: Diagnosis not present

## 2017-10-21 DIAGNOSIS — Z885 Allergy status to narcotic agent status: Secondary | ICD-10-CM | POA: Diagnosis not present

## 2017-10-21 DIAGNOSIS — C50212 Malignant neoplasm of upper-inner quadrant of left female breast: Secondary | ICD-10-CM | POA: Diagnosis not present

## 2017-10-21 DIAGNOSIS — Z888 Allergy status to other drugs, medicaments and biological substances status: Secondary | ICD-10-CM | POA: Diagnosis not present

## 2017-10-21 DIAGNOSIS — Z79899 Other long term (current) drug therapy: Secondary | ICD-10-CM | POA: Diagnosis not present

## 2017-10-21 DIAGNOSIS — Z79890 Hormone replacement therapy (postmenopausal): Secondary | ICD-10-CM | POA: Diagnosis not present

## 2017-10-21 DIAGNOSIS — Z510 Encounter for antineoplastic radiation therapy: Secondary | ICD-10-CM | POA: Diagnosis not present

## 2017-10-22 ENCOUNTER — Ambulatory Visit: Payer: Medicare Other

## 2017-10-25 ENCOUNTER — Ambulatory Visit
Admission: RE | Admit: 2017-10-25 | Discharge: 2017-10-25 | Disposition: A | Discharge status: Home / Self Care | Payer: Medicare Other | Source: Ambulatory Visit | Attending: Radiation Oncology | Admitting: Radiation Oncology

## 2017-10-25 DIAGNOSIS — C50212 Malignant neoplasm of upper-inner quadrant of left female breast: Secondary | ICD-10-CM | POA: Diagnosis not present

## 2017-10-25 DIAGNOSIS — Z79890 Hormone replacement therapy (postmenopausal): Secondary | ICD-10-CM | POA: Diagnosis not present

## 2017-10-25 DIAGNOSIS — Z888 Allergy status to other drugs, medicaments and biological substances status: Secondary | ICD-10-CM | POA: Diagnosis not present

## 2017-10-25 DIAGNOSIS — Z885 Allergy status to narcotic agent status: Secondary | ICD-10-CM | POA: Diagnosis not present

## 2017-10-25 DIAGNOSIS — Z510 Encounter for antineoplastic radiation therapy: Secondary | ICD-10-CM | POA: Diagnosis not present

## 2017-10-25 DIAGNOSIS — Z880 Allergy status to penicillin: Secondary | ICD-10-CM | POA: Diagnosis not present

## 2017-10-25 DIAGNOSIS — Z79899 Other long term (current) drug therapy: Secondary | ICD-10-CM | POA: Diagnosis not present

## 2017-10-25 DIAGNOSIS — Z171 Estrogen receptor negative status [ER-]: Secondary | ICD-10-CM | POA: Diagnosis not present

## 2017-10-25 MED ORDER — RADIAPLEXRX EX GEL
Freq: Once | CUTANEOUS | Status: AC
  Administered 2017-10-25: 09:00:00 via TOPICAL

## 2017-10-27 ENCOUNTER — Ambulatory Visit
Admission: RE | Admit: 2017-10-27 | Discharge: 2017-10-27 | Disposition: A | Discharge status: Home / Self Care | Payer: Medicare Other | Source: Ambulatory Visit | Attending: Radiation Oncology | Admitting: Radiation Oncology

## 2017-10-27 DIAGNOSIS — Z510 Encounter for antineoplastic radiation therapy: Secondary | ICD-10-CM | POA: Diagnosis not present

## 2017-10-27 DIAGNOSIS — Z880 Allergy status to penicillin: Secondary | ICD-10-CM | POA: Diagnosis not present

## 2017-10-27 DIAGNOSIS — Z171 Estrogen receptor negative status [ER-]: Secondary | ICD-10-CM | POA: Diagnosis not present

## 2017-10-27 DIAGNOSIS — C50212 Malignant neoplasm of upper-inner quadrant of left female breast: Secondary | ICD-10-CM | POA: Diagnosis not present

## 2017-10-27 DIAGNOSIS — Z79890 Hormone replacement therapy (postmenopausal): Secondary | ICD-10-CM | POA: Diagnosis not present

## 2017-10-27 DIAGNOSIS — Z888 Allergy status to other drugs, medicaments and biological substances status: Secondary | ICD-10-CM | POA: Diagnosis not present

## 2017-10-27 DIAGNOSIS — Z885 Allergy status to narcotic agent status: Secondary | ICD-10-CM | POA: Diagnosis not present

## 2017-10-27 DIAGNOSIS — Z79899 Other long term (current) drug therapy: Secondary | ICD-10-CM | POA: Diagnosis not present

## 2017-10-28 ENCOUNTER — Ambulatory Visit
Admission: RE | Admit: 2017-10-28 | Discharge: 2017-10-28 | Disposition: A | Discharge status: Home / Self Care | Payer: Medicare Other | Source: Ambulatory Visit | Attending: Radiation Oncology | Admitting: Radiation Oncology

## 2017-10-28 DIAGNOSIS — Z888 Allergy status to other drugs, medicaments and biological substances status: Secondary | ICD-10-CM | POA: Diagnosis not present

## 2017-10-28 DIAGNOSIS — C50212 Malignant neoplasm of upper-inner quadrant of left female breast: Secondary | ICD-10-CM | POA: Diagnosis not present

## 2017-10-28 DIAGNOSIS — Z885 Allergy status to narcotic agent status: Secondary | ICD-10-CM | POA: Diagnosis not present

## 2017-10-28 DIAGNOSIS — Z880 Allergy status to penicillin: Secondary | ICD-10-CM | POA: Diagnosis not present

## 2017-10-28 DIAGNOSIS — Z171 Estrogen receptor negative status [ER-]: Secondary | ICD-10-CM | POA: Diagnosis not present

## 2017-10-28 DIAGNOSIS — Z79899 Other long term (current) drug therapy: Secondary | ICD-10-CM | POA: Diagnosis not present

## 2017-10-28 DIAGNOSIS — Z510 Encounter for antineoplastic radiation therapy: Secondary | ICD-10-CM | POA: Diagnosis not present

## 2017-10-28 DIAGNOSIS — Z79890 Hormone replacement therapy (postmenopausal): Secondary | ICD-10-CM | POA: Diagnosis not present

## 2017-10-29 ENCOUNTER — Ambulatory Visit
Admission: RE | Admit: 2017-10-29 | Discharge: 2017-10-29 | Disposition: A | Discharge status: Home / Self Care | Payer: Medicare Other | Source: Ambulatory Visit | Attending: Radiation Oncology | Admitting: Radiation Oncology

## 2017-10-29 DIAGNOSIS — Z885 Allergy status to narcotic agent status: Secondary | ICD-10-CM | POA: Diagnosis not present

## 2017-10-29 DIAGNOSIS — Z888 Allergy status to other drugs, medicaments and biological substances status: Secondary | ICD-10-CM | POA: Diagnosis not present

## 2017-10-29 DIAGNOSIS — Z171 Estrogen receptor negative status [ER-]: Secondary | ICD-10-CM | POA: Diagnosis not present

## 2017-10-29 DIAGNOSIS — Z79890 Hormone replacement therapy (postmenopausal): Secondary | ICD-10-CM | POA: Diagnosis not present

## 2017-10-29 DIAGNOSIS — Z510 Encounter for antineoplastic radiation therapy: Secondary | ICD-10-CM | POA: Diagnosis not present

## 2017-10-29 DIAGNOSIS — Z79899 Other long term (current) drug therapy: Secondary | ICD-10-CM | POA: Diagnosis not present

## 2017-10-29 DIAGNOSIS — Z880 Allergy status to penicillin: Secondary | ICD-10-CM | POA: Diagnosis not present

## 2017-10-29 DIAGNOSIS — C50212 Malignant neoplasm of upper-inner quadrant of left female breast: Secondary | ICD-10-CM | POA: Diagnosis not present

## 2017-11-01 ENCOUNTER — Ambulatory Visit
Admission: RE | Admit: 2017-11-01 | Discharge: 2017-11-01 | Disposition: A | Discharge status: Home / Self Care | Payer: Medicare Other | Source: Ambulatory Visit | Attending: Radiation Oncology | Admitting: Radiation Oncology

## 2017-11-01 DIAGNOSIS — Z79890 Hormone replacement therapy (postmenopausal): Secondary | ICD-10-CM | POA: Diagnosis not present

## 2017-11-01 DIAGNOSIS — Z79899 Other long term (current) drug therapy: Secondary | ICD-10-CM | POA: Diagnosis not present

## 2017-11-01 DIAGNOSIS — C50212 Malignant neoplasm of upper-inner quadrant of left female breast: Secondary | ICD-10-CM | POA: Diagnosis not present

## 2017-11-01 DIAGNOSIS — Z888 Allergy status to other drugs, medicaments and biological substances status: Secondary | ICD-10-CM | POA: Diagnosis not present

## 2017-11-01 DIAGNOSIS — Z510 Encounter for antineoplastic radiation therapy: Secondary | ICD-10-CM | POA: Diagnosis not present

## 2017-11-01 DIAGNOSIS — Z885 Allergy status to narcotic agent status: Secondary | ICD-10-CM | POA: Diagnosis not present

## 2017-11-01 DIAGNOSIS — Z880 Allergy status to penicillin: Secondary | ICD-10-CM | POA: Diagnosis not present

## 2017-11-01 DIAGNOSIS — Z171 Estrogen receptor negative status [ER-]: Secondary | ICD-10-CM | POA: Diagnosis not present

## 2017-11-02 DIAGNOSIS — Z923 Personal history of irradiation: Secondary | ICD-10-CM

## 2017-11-02 HISTORY — DX: Personal history of irradiation: Z92.3

## 2017-11-03 ENCOUNTER — Ambulatory Visit: Payer: Medicare Other

## 2017-11-03 ENCOUNTER — Ambulatory Visit
Admission: RE | Admit: 2017-11-03 | Discharge: 2017-11-03 | Disposition: A | Discharge status: Home / Self Care | Payer: Medicare Other | Source: Ambulatory Visit | Attending: Radiation Oncology | Admitting: Radiation Oncology

## 2017-11-03 DIAGNOSIS — C50212 Malignant neoplasm of upper-inner quadrant of left female breast: Secondary | ICD-10-CM | POA: Diagnosis not present

## 2017-11-03 DIAGNOSIS — Z510 Encounter for antineoplastic radiation therapy: Secondary | ICD-10-CM | POA: Diagnosis not present

## 2017-11-03 DIAGNOSIS — Z888 Allergy status to other drugs, medicaments and biological substances status: Secondary | ICD-10-CM | POA: Diagnosis not present

## 2017-11-03 DIAGNOSIS — Z79899 Other long term (current) drug therapy: Secondary | ICD-10-CM | POA: Diagnosis not present

## 2017-11-03 DIAGNOSIS — Z885 Allergy status to narcotic agent status: Secondary | ICD-10-CM | POA: Diagnosis not present

## 2017-11-03 DIAGNOSIS — Z880 Allergy status to penicillin: Secondary | ICD-10-CM | POA: Diagnosis not present

## 2017-11-03 DIAGNOSIS — Z79890 Hormone replacement therapy (postmenopausal): Secondary | ICD-10-CM | POA: Diagnosis not present

## 2017-11-03 DIAGNOSIS — Z171 Estrogen receptor negative status [ER-]: Secondary | ICD-10-CM | POA: Diagnosis not present

## 2017-11-04 ENCOUNTER — Ambulatory Visit: Payer: Medicare Other

## 2017-11-04 ENCOUNTER — Ambulatory Visit
Admission: RE | Admit: 2017-11-04 | Discharge: 2017-11-04 | Disposition: A | Discharge status: Home / Self Care | Payer: Medicare Other | Source: Ambulatory Visit | Attending: Radiation Oncology | Admitting: Radiation Oncology

## 2017-11-04 DIAGNOSIS — Z880 Allergy status to penicillin: Secondary | ICD-10-CM | POA: Diagnosis not present

## 2017-11-04 DIAGNOSIS — Z510 Encounter for antineoplastic radiation therapy: Secondary | ICD-10-CM | POA: Diagnosis not present

## 2017-11-04 DIAGNOSIS — Z79899 Other long term (current) drug therapy: Secondary | ICD-10-CM | POA: Diagnosis not present

## 2017-11-04 DIAGNOSIS — Z885 Allergy status to narcotic agent status: Secondary | ICD-10-CM | POA: Diagnosis not present

## 2017-11-04 DIAGNOSIS — Z888 Allergy status to other drugs, medicaments and biological substances status: Secondary | ICD-10-CM | POA: Diagnosis not present

## 2017-11-04 DIAGNOSIS — C50212 Malignant neoplasm of upper-inner quadrant of left female breast: Secondary | ICD-10-CM | POA: Diagnosis not present

## 2017-11-04 DIAGNOSIS — Z171 Estrogen receptor negative status [ER-]: Secondary | ICD-10-CM | POA: Diagnosis not present

## 2017-11-04 DIAGNOSIS — Z79890 Hormone replacement therapy (postmenopausal): Secondary | ICD-10-CM | POA: Diagnosis not present

## 2017-11-05 ENCOUNTER — Ambulatory Visit: Payer: Medicare Other

## 2017-11-05 ENCOUNTER — Ambulatory Visit
Admission: RE | Admit: 2017-11-05 | Discharge: 2017-11-05 | Disposition: A | Discharge status: Home / Self Care | Payer: Medicare Other | Source: Ambulatory Visit | Attending: Radiation Oncology | Admitting: Radiation Oncology

## 2017-11-05 DIAGNOSIS — Z171 Estrogen receptor negative status [ER-]: Secondary | ICD-10-CM | POA: Diagnosis not present

## 2017-11-05 DIAGNOSIS — Z880 Allergy status to penicillin: Secondary | ICD-10-CM | POA: Diagnosis not present

## 2017-11-05 DIAGNOSIS — Z79890 Hormone replacement therapy (postmenopausal): Secondary | ICD-10-CM | POA: Diagnosis not present

## 2017-11-05 DIAGNOSIS — Z885 Allergy status to narcotic agent status: Secondary | ICD-10-CM | POA: Diagnosis not present

## 2017-11-05 DIAGNOSIS — Z888 Allergy status to other drugs, medicaments and biological substances status: Secondary | ICD-10-CM | POA: Diagnosis not present

## 2017-11-05 DIAGNOSIS — Z510 Encounter for antineoplastic radiation therapy: Secondary | ICD-10-CM | POA: Diagnosis not present

## 2017-11-05 DIAGNOSIS — C50212 Malignant neoplasm of upper-inner quadrant of left female breast: Secondary | ICD-10-CM | POA: Diagnosis not present

## 2017-11-05 DIAGNOSIS — Z79899 Other long term (current) drug therapy: Secondary | ICD-10-CM | POA: Diagnosis not present

## 2017-11-06 ENCOUNTER — Ambulatory Visit: Payer: Medicare Other

## 2017-11-08 ENCOUNTER — Ambulatory Visit
Admission: RE | Admit: 2017-11-08 | Discharge: 2017-11-08 | Disposition: A | Discharge status: Home / Self Care | Payer: Medicare Other | Source: Ambulatory Visit | Attending: Radiation Oncology | Admitting: Radiation Oncology

## 2017-11-08 ENCOUNTER — Ambulatory Visit: Payer: Medicare Other

## 2017-11-08 DIAGNOSIS — Z880 Allergy status to penicillin: Secondary | ICD-10-CM | POA: Diagnosis not present

## 2017-11-08 DIAGNOSIS — Z79899 Other long term (current) drug therapy: Secondary | ICD-10-CM | POA: Diagnosis not present

## 2017-11-08 DIAGNOSIS — Z79890 Hormone replacement therapy (postmenopausal): Secondary | ICD-10-CM | POA: Diagnosis not present

## 2017-11-08 DIAGNOSIS — Z888 Allergy status to other drugs, medicaments and biological substances status: Secondary | ICD-10-CM | POA: Diagnosis not present

## 2017-11-08 DIAGNOSIS — Z885 Allergy status to narcotic agent status: Secondary | ICD-10-CM | POA: Diagnosis not present

## 2017-11-08 DIAGNOSIS — Z171 Estrogen receptor negative status [ER-]: Secondary | ICD-10-CM | POA: Diagnosis not present

## 2017-11-08 DIAGNOSIS — C50212 Malignant neoplasm of upper-inner quadrant of left female breast: Secondary | ICD-10-CM | POA: Diagnosis not present

## 2017-11-08 DIAGNOSIS — Z510 Encounter for antineoplastic radiation therapy: Secondary | ICD-10-CM | POA: Diagnosis not present

## 2017-11-09 ENCOUNTER — Ambulatory Visit: Payer: Medicare Other

## 2017-11-09 ENCOUNTER — Ambulatory Visit
Admission: RE | Admit: 2017-11-09 | Discharge: 2017-11-09 | Disposition: A | Discharge status: Home / Self Care | Payer: Medicare Other | Source: Ambulatory Visit | Attending: Radiation Oncology | Admitting: Radiation Oncology

## 2017-11-09 DIAGNOSIS — Z888 Allergy status to other drugs, medicaments and biological substances status: Secondary | ICD-10-CM | POA: Diagnosis not present

## 2017-11-09 DIAGNOSIS — C50212 Malignant neoplasm of upper-inner quadrant of left female breast: Secondary | ICD-10-CM | POA: Diagnosis not present

## 2017-11-09 DIAGNOSIS — Z885 Allergy status to narcotic agent status: Secondary | ICD-10-CM | POA: Diagnosis not present

## 2017-11-09 DIAGNOSIS — Z79899 Other long term (current) drug therapy: Secondary | ICD-10-CM | POA: Diagnosis not present

## 2017-11-09 DIAGNOSIS — Z171 Estrogen receptor negative status [ER-]: Secondary | ICD-10-CM | POA: Diagnosis not present

## 2017-11-09 DIAGNOSIS — Z79890 Hormone replacement therapy (postmenopausal): Secondary | ICD-10-CM | POA: Diagnosis not present

## 2017-11-09 DIAGNOSIS — Z880 Allergy status to penicillin: Secondary | ICD-10-CM | POA: Diagnosis not present

## 2017-11-09 DIAGNOSIS — Z510 Encounter for antineoplastic radiation therapy: Secondary | ICD-10-CM | POA: Diagnosis not present

## 2017-11-10 ENCOUNTER — Ambulatory Visit
Admission: RE | Admit: 2017-11-10 | Discharge: 2017-11-10 | Disposition: A | Discharge status: Home / Self Care | Payer: Medicare Other | Source: Ambulatory Visit | Attending: Radiation Oncology | Admitting: Radiation Oncology

## 2017-11-10 ENCOUNTER — Ambulatory Visit: Payer: Medicare Other

## 2017-11-10 DIAGNOSIS — Z79899 Other long term (current) drug therapy: Secondary | ICD-10-CM | POA: Diagnosis not present

## 2017-11-10 DIAGNOSIS — Z510 Encounter for antineoplastic radiation therapy: Secondary | ICD-10-CM | POA: Diagnosis not present

## 2017-11-10 DIAGNOSIS — Z79890 Hormone replacement therapy (postmenopausal): Secondary | ICD-10-CM | POA: Diagnosis not present

## 2017-11-10 DIAGNOSIS — C50212 Malignant neoplasm of upper-inner quadrant of left female breast: Secondary | ICD-10-CM | POA: Diagnosis not present

## 2017-11-10 DIAGNOSIS — Z888 Allergy status to other drugs, medicaments and biological substances status: Secondary | ICD-10-CM | POA: Diagnosis not present

## 2017-11-10 DIAGNOSIS — Z171 Estrogen receptor negative status [ER-]: Secondary | ICD-10-CM | POA: Diagnosis not present

## 2017-11-10 DIAGNOSIS — Z880 Allergy status to penicillin: Secondary | ICD-10-CM | POA: Diagnosis not present

## 2017-11-10 DIAGNOSIS — Z885 Allergy status to narcotic agent status: Secondary | ICD-10-CM | POA: Diagnosis not present

## 2017-11-11 ENCOUNTER — Encounter: Payer: Self-pay | Admitting: Radiation Oncology

## 2017-11-11 ENCOUNTER — Ambulatory Visit: Payer: Medicare Other

## 2017-11-11 ENCOUNTER — Ambulatory Visit
Admission: RE | Admit: 2017-11-11 | Discharge: 2017-11-11 | Disposition: A | Discharge status: Home / Self Care | Payer: Medicare Other | Source: Ambulatory Visit | Attending: Radiation Oncology | Admitting: Radiation Oncology

## 2017-11-11 DIAGNOSIS — Z880 Allergy status to penicillin: Secondary | ICD-10-CM | POA: Diagnosis not present

## 2017-11-11 DIAGNOSIS — Z79890 Hormone replacement therapy (postmenopausal): Secondary | ICD-10-CM | POA: Diagnosis not present

## 2017-11-11 DIAGNOSIS — Z888 Allergy status to other drugs, medicaments and biological substances status: Secondary | ICD-10-CM | POA: Diagnosis not present

## 2017-11-11 DIAGNOSIS — C50212 Malignant neoplasm of upper-inner quadrant of left female breast: Secondary | ICD-10-CM | POA: Diagnosis not present

## 2017-11-11 DIAGNOSIS — Z171 Estrogen receptor negative status [ER-]: Secondary | ICD-10-CM | POA: Diagnosis not present

## 2017-11-11 DIAGNOSIS — Z885 Allergy status to narcotic agent status: Secondary | ICD-10-CM | POA: Diagnosis not present

## 2017-11-11 DIAGNOSIS — Z510 Encounter for antineoplastic radiation therapy: Secondary | ICD-10-CM | POA: Diagnosis not present

## 2017-11-11 DIAGNOSIS — Z79899 Other long term (current) drug therapy: Secondary | ICD-10-CM | POA: Diagnosis not present

## 2017-11-12 ENCOUNTER — Ambulatory Visit: Payer: Medicare Other

## 2017-11-15 ENCOUNTER — Ambulatory Visit: Payer: Medicare Other

## 2017-11-15 NOTE — Progress Notes (Signed)
  Radiation Oncology         [phone] ________________________________  Name: Jessica [name] MRN: [id number]  Date: 11/11/2017  DOB: 30-Sep-1941  End of Treatment Note  Diagnosis:   77 y.o. women with stage IIA (pT2, pN0) grade 3 invasive carcinoma of the left (triple negative) breast.      Indication for treatment:  Curative       Radiation treatment dates:   10/13/17 - 11/11/17   Site/dose:   Left breast // 40.05 Gy in 15 fractions Boost // 10 Gy in 5 fractions  Beams/energy:    Left breast; Photon// 3D Boost; electrons   Narrative: The patient tolerated radiation treatment relatively well.  Patient denies having any pain, states that she has mild fatigue. Patient is taking naps to help with fatigue. Patients skin is hyperpigmented, gave her radiaplex to help with redness of the skin.  Plan: The patient has completed radiation treatment. The patient will return to radiation oncology clinic for routine followup in one month. I advised them to call or return sooner if they have any questions or concerns related to their recovery or treatment.  -----------------------------------  Blair Promise, PhD, MD  This document serves as a record of services personally performed by Gery Pray MD. It was created on his behalf by Delton Coombes, a trained medical scribe. The creation of this record is based on the scribe's personal observations and the provider's statements to them.

## 2017-11-16 ENCOUNTER — Ambulatory Visit: Payer: Medicare Other

## 2017-11-17 ENCOUNTER — Ambulatory Visit: Payer: Medicare Other

## 2017-11-18 ENCOUNTER — Inpatient Hospital Stay: Payer: Medicare Other | Attending: Hematology & Oncology | Admitting: Hematology & Oncology

## 2017-11-18 ENCOUNTER — Inpatient Hospital Stay: Payer: Medicare Other

## 2017-11-18 ENCOUNTER — Ambulatory Visit: Payer: Medicare Other

## 2017-11-18 VITALS — BP 157/76 | HR 84 | Temp 98.2°F | Resp 19 | Wt 169.2 lb

## 2017-11-18 DIAGNOSIS — Z79899 Other long term (current) drug therapy: Secondary | ICD-10-CM

## 2017-11-18 DIAGNOSIS — Z170 Estrogen receptor positive status [ER+]: Secondary | ICD-10-CM | POA: Diagnosis not present

## 2017-11-18 DIAGNOSIS — Z9221 Personal history of antineoplastic chemotherapy: Secondary | ICD-10-CM | POA: Diagnosis not present

## 2017-11-18 DIAGNOSIS — N61 Mastitis without abscess: Secondary | ICD-10-CM | POA: Diagnosis not present

## 2017-11-18 DIAGNOSIS — Z880 Allergy status to penicillin: Secondary | ICD-10-CM | POA: Diagnosis not present

## 2017-11-18 DIAGNOSIS — Z780 Asymptomatic menopausal state: Secondary | ICD-10-CM

## 2017-11-18 DIAGNOSIS — Z171 Estrogen receptor negative status [ER-]: Secondary | ICD-10-CM | POA: Diagnosis not present

## 2017-11-18 DIAGNOSIS — Z923 Personal history of irradiation: Secondary | ICD-10-CM | POA: Diagnosis not present

## 2017-11-18 DIAGNOSIS — Z95828 Presence of other vascular implants and grafts: Secondary | ICD-10-CM

## 2017-11-18 DIAGNOSIS — C50212 Malignant neoplasm of upper-inner quadrant of left female breast: Secondary | ICD-10-CM | POA: Diagnosis not present

## 2017-11-18 LAB — COMPREHENSIVE METABOLIC PANEL
ALT: 17 U/L (ref 10–47)
AST: 20 U/L (ref 11–38)
Albumin: 3.4 g/dL — ABNORMAL LOW (ref 3.5–5.0)
Alkaline Phosphatase: 61 U/L (ref 26–84)
Anion gap: 9 (ref 5–15)
BUN: 11 mg/dL (ref 7–22)
CO2: 32 mmol/L (ref 18–33)
Calcium: 9.7 mg/dL (ref 8.0–10.3)
Chloride: 100 mmol/L (ref 98–108)
Creatinine, Ser: 0.6 mg/dL (ref 0.60–1.20)
Glucose, Bld: 80 mg/dL (ref 73–118)
Potassium: 3.7 mmol/L (ref 3.5–5.1)
Sodium: 141 mmol/L (ref 128–145)
Total Bilirubin: 0.5 mg/dL (ref 0.2–1.6)
Total Protein: 6.7 g/dL (ref 6.4–8.1)

## 2017-11-18 LAB — CBC WITH DIFFERENTIAL (CANCER CENTER ONLY)
Basophils Absolute: 0 10*3/uL (ref 0.0–0.1)
Basophils Relative: 0 %
Eosinophils Absolute: 0 10*3/uL (ref 0.0–0.5)
Eosinophils Relative: 0 %
HCT: 35 % (ref 34.8–46.6)
Hemoglobin: 11.3 g/dL — ABNORMAL LOW (ref 11.6–15.9)
Lymphocytes Relative: 24 %
Lymphs Abs: 1.1 10*3/uL (ref 0.9–3.3)
MCH: 32.8 pg (ref 26.0–34.0)
MCHC: 32.3 g/dL (ref 32.0–36.0)
MCV: 101.7 fL — ABNORMAL HIGH (ref 81.0–101.0)
Monocytes Absolute: 0.5 10*3/uL (ref 0.1–0.9)
Monocytes Relative: 10 %
Neutro Abs: 3.1 10*3/uL (ref 1.5–6.5)
Neutrophils Relative %: 66 %
Platelet Count: 234 10*3/uL (ref 145–400)
RBC: 3.44 MIL/uL — ABNORMAL LOW (ref 3.70–5.32)
RDW: 13.8 % (ref 11.1–15.7)
WBC Count: 4.6 10*3/uL (ref 3.9–10.3)

## 2017-11-18 MED ORDER — HEPARIN SOD (PORK) LOCK FLUSH 100 UNIT/ML IV SOLN
500.0000 [IU] | Freq: Once | INTRAVENOUS | Status: AC
  Administered 2017-11-18: 500 [IU] via INTRAVENOUS
  Filled 2017-11-18: qty 5

## 2017-11-18 MED ORDER — SODIUM CHLORIDE 0.9% FLUSH
10.0000 mL | INTRAVENOUS | Status: DC | PRN
  Administered 2017-11-18: 10 mL via INTRAVENOUS
  Filled 2017-11-18: qty 10

## 2017-11-18 NOTE — Progress Notes (Signed)
Cystograms  Hematology and Oncology Follow Up Visit  Jessica [name] [id number] 08/31/1941 77 y.o. 11/18/2017   Principle Diagnosis:   Stage IIA (T2bN0M0) invasive ductal ca of the LEFT breast - TRIPLE NEGATIVE  Staphylococcus aureus wound infection of the LEFT axilla  Cellulitis of the left breast  Current Therapy:    Taxotere/Carboplatin - s/p cycle #4 -completed adjuvant therapy on 09/09/2017  Radiation therapy to the right breast.-Completed 4005 rad with an additional 1000 rad boost on 11/11/2017     Interim History:  Jessica [name] is back for follow-up.  She is doing well.  She finished her radiation therapy a week or so ago.  She had a little bit of difficulty with the boost of radiation.  She is glad that this is over.  She had a nice Christmas.  She and her daughter brought [place] in some nice bundt cakes to enjoy.  She has had a good appetite.  She has had no problems with nausea or vomiting.  She has had no cough or shortness of breath.  She has had some slight stiffness in her shoulders.  There is been no bleeding.  She has had no fever.  She has had no change in bowel or bladder habits.  Overall, her performance status is ECOG 1.  Medications:  Current Outpatient Medications:  .  calcium citrate-vitamin D (CITRACAL+D) 315-200 MG-UNIT per tablet, Take 1 tablet by mouth daily., Disp: , Rfl:  .  diphenhydramine-acetaminophen (TYLENOL PM) 25-500 MG TABS, Take 1 tablet by mouth at bedtime as needed (sleep). , Disp: , Rfl:  .  GENERLAC 10 GM/15ML SOLN, TAKE 30 ML BY MOUTH EVERY 4 HOURS AS NEEDED FOR MILD CONSTIPATION UNTIL BOWEL MOVEMENT, Disp: 21600 mL, Rfl: 3 .  hyaluronate sodium (RADIAPLEXRX) GEL, Apply 1 application topically once., Disp: , Rfl:  .  hydrochlorothiazide (MICROZIDE) 12.5 MG capsule, Take 12.5 mg by mouth daily., Disp: , Rfl: 1 .  levothyroxine (SYNTHROID, LEVOTHROID) 75 MCG tablet, Take 75 mcg by mouth daily., Disp: , Rfl:  .  lidocaine-prilocaine (EMLA)  cream, Apply 1 application topically as needed., Disp: 30 g, Rfl: 6 .  LORazepam (ATIVAN) 0.5 MG tablet, Take 1 tablet (0.5 mg total) by mouth every 6 (six) hours as needed (Nausea or vomiting)., Disp: 30 tablet, Rfl: 0 .  Lysine 1000 MG TABS, Take 1,000 mg by mouth as needed., Disp: , Rfl:  .  Multiple Vitamin (MULTIVITAMIN) tablet, Take 1 tablet by mouth daily., Disp: , Rfl:  .  non-metallic deodorant (ALRA) MISC, Apply 1 application topically daily as needed., Disp: , Rfl:  .  omeprazole (PRILOSEC) 20 MG capsule, , Disp: , Rfl:  .  ondansetron (ZOFRAN) 8 MG tablet, Take 1 tablet (8 mg total) by mouth 2 (two) times daily as needed for refractory nausea / vomiting. Start on day 3 after chemo., Disp: 30 tablet, Rfl: 1 .  OVER THE COUNTER MEDICATION, Take 15 mLs by mouth 2 (two) times daily as needed (pain)., Disp: , Rfl:  .  polyvinyl alcohol (LIQUIFILM TEARS) 1.4 % ophthalmic solution, 1 drop as needed for dry eyes., Disp: , Rfl:  .  potassium chloride SA (K-DUR,KLOR-CON) 20 MEQ tablet, Take 2 tablets (40 mEq total) daily by mouth., Disp: 60 tablet, Rfl: 3 .  prochlorperazine (COMPAZINE) 10 MG tablet, TAKE 1 TABLET(10 MG) BY MOUTH EVERY 6 HOURS AS NEEDED FOR NAUSEA OR VOMITING, Disp: 385 tablet, Rfl: 1 .  venlafaxine XR (EFFEXOR-XR) 75 MG 24 hr capsule, Take 225 mg  by mouth daily., Disp: , Rfl:  No current facility-administered medications for this visit.   Facility-Administered Medications Ordered in Other Visits:  .  sodium chloride flush (NS) 0.9 % injection 10 mL, 10 mL, Intravenous, PRN, [name], MD, 10 mL at 11/18/17 1140  Allergies:  Allergies  Allergen Reactions  . Codeine Hives  . Penicillins Hives    Has patient had a PCN reaction causing immediate rash, facial/tongue/throat swelling, SOB or lightheadedness with hypotension: yes Has patient had a PCN reaction causing severe rash involving mucus membranes or skin necrosis:no Has patient had a PCN reaction that required  hospitalization:no Has patient had a PCN reaction occurring within the last 10 years: no If all of the above answers are "NO", then may proceed with Cephalosporin use. Has patient had a PCN reaction causing immediate rash, facial/tongue/throat swelling, SOB or lightheadedness with hypotension: yes Has patient had a PCN reaction causing severe rash involving mucus membranes or skin necrosis:  no Has patient had a PCN reaction that required hospitalization:  no Has patient had a PCN reaction occurring within the last 10 years: no If all of the above answers are "NO", then may proceed with Cephalosporin use.   . Advil [Ibuprofen] Hives and Itching  . Band-Aid Plus Antibiotic [Bacitracin-Polymyxin B] Dermatitis  . Benazepril Swelling    Possible cause of tongue swelling  . Other Rash    Band-aid    Past Medical History, Surgical history, Social history, and Family History were reviewed and updated.  Review of Systems: Review of Systems  All other systems reviewed and are negative.   Physical Exam:  weight is 169 lb 4 oz (76.8 kg). Her oral temperature is 98.2 F (36.8 C). Her blood pressure is 157/76 (abnormal) and her pulse is 84. Her respiration is 19 and oxygen saturation is 97%.   Wt Readings from Last 3 Encounters:  11/18/17 169 lb 4 oz (76.8 kg)  10/08/17 166 lb (75.3 kg)  09/27/17 164 lb 6.4 oz (74.6 kg)    Physical Exam  Constitutional: She is oriented to person, place, and time.  HENT:  Head: Normocephalic and atraumatic.  Mouth/Throat: Oropharynx is clear and moist.  Eyes: EOM are normal. Pupils are equal, round, and reactive to light.  Neck: Normal range of motion.  Cardiovascular: Normal rate, regular rhythm and normal heart sounds.  Pulmonary/Chest: Effort normal and breath sounds normal.  Abdominal: Soft. Bowel sounds are normal.  Musculoskeletal: Normal range of motion. She exhibits no edema, tenderness or deformity.  Lymphadenopathy:    She has no cervical  adenopathy.  Neurological: She is alert and oriented to person, place, and time.  Skin: Skin is warm and dry. No rash noted. No erythema.  Psychiatric: She has a normal mood and affect. Her behavior is normal. Judgment and thought content normal.  Vitals reviewed.    Lab Results  Component Value Date   WBC 4.6 11/18/2017   HGB 10.9 (L) 10/08/2017   HCT 35.0 11/18/2017   MCV 101.7 (H) 11/18/2017   PLT 234 11/18/2017     Chemistry      Component Value Date/Time   NA 143 10/08/2017 1042   NA 141 06/16/2017 1014   K 3.4 10/08/2017 1042   K 3.8 06/16/2017 1014   CL 100 10/08/2017 1042   CO2 30 10/08/2017 1042   CO2 31 (H) 06/16/2017 1014   BUN 10 10/08/2017 1042   BUN 8.9 06/16/2017 1014   CREATININE 0.9 10/08/2017 1042   CREATININE  0.8 06/16/2017 1014      Component Value Date/Time   CALCIUM 9.5 10/08/2017 1042   CALCIUM 10.1 06/16/2017 1014   ALKPHOS 68 10/08/2017 1042   ALKPHOS 64 06/16/2017 1014   AST 20 10/08/2017 1042   AST 21 06/16/2017 1014   ALT 20 10/08/2017 1042   ALT 20 06/16/2017 1014   BILITOT 0.70 10/08/2017 1042   BILITOT 0.37 06/16/2017 1014       Impression and Plan: Jessica [name] is a 77 year old postmenopausal white female with a triple negative stage IIA ductal carcinoma of the left breast.  She completed her adjuvant chemotherapy.  I must say, that we really had a tough time with this.  Thankfully, we were able to get done.  She completed her radiation therapy a week or so ago.  I am so happy that she got through this.  Since her tumor is triple negative, there is no indication for adjuvant hormonal therapy.  For right now, we can plan to get her back in 3 months.  She still has a Port-A-Cath in.  I probably would keep this in for right now.  I just want to make sure that she does not run into any unexpected issues and we would have no IV access for her.   [name], MD 1/17/201912:03 PM

## 2017-11-19 ENCOUNTER — Ambulatory Visit: Payer: Medicare Other

## 2017-11-22 ENCOUNTER — Ambulatory Visit: Payer: Medicare Other

## 2017-11-23 ENCOUNTER — Ambulatory Visit: Payer: Medicare Other

## 2017-11-24 ENCOUNTER — Ambulatory Visit: Payer: Medicare Other

## 2017-11-25 ENCOUNTER — Ambulatory Visit: Payer: Medicare Other

## 2017-11-26 ENCOUNTER — Ambulatory Visit: Payer: Medicare Other

## 2017-12-14 DIAGNOSIS — N390 Urinary tract infection, site not specified: Secondary | ICD-10-CM | POA: Diagnosis not present

## 2017-12-14 DIAGNOSIS — E7800 Pure hypercholesterolemia, unspecified: Secondary | ICD-10-CM | POA: Diagnosis not present

## 2017-12-14 DIAGNOSIS — I10 Essential (primary) hypertension: Secondary | ICD-10-CM | POA: Diagnosis not present

## 2017-12-14 DIAGNOSIS — E559 Vitamin D deficiency, unspecified: Secondary | ICD-10-CM | POA: Diagnosis not present

## 2017-12-14 DIAGNOSIS — E039 Hypothyroidism, unspecified: Secondary | ICD-10-CM | POA: Diagnosis not present

## 2017-12-14 DIAGNOSIS — Z0000 Encounter for general adult medical examination without abnormal findings: Secondary | ICD-10-CM | POA: Diagnosis not present

## 2017-12-16 ENCOUNTER — Encounter: Payer: Self-pay | Admitting: Oncology

## 2017-12-20 ENCOUNTER — Encounter: Payer: Self-pay | Admitting: Radiation Oncology

## 2017-12-20 ENCOUNTER — Other Ambulatory Visit: Payer: Self-pay

## 2017-12-20 ENCOUNTER — Ambulatory Visit
Admission: RE | Admit: 2017-12-20 | Discharge: 2017-12-20 | Disposition: A | Discharge status: Home / Self Care | Payer: Medicare Other | Source: Ambulatory Visit | Attending: Hematology & Oncology | Admitting: Hematology & Oncology

## 2017-12-20 DIAGNOSIS — N390 Urinary tract infection, site not specified: Secondary | ICD-10-CM | POA: Insufficient documentation

## 2017-12-20 DIAGNOSIS — Y842 Radiological procedure and radiotherapy as the cause of abnormal reaction of the patient, or of later complication, without mention of misadventure at the time of the procedure: Secondary | ICD-10-CM | POA: Diagnosis not present

## 2017-12-20 DIAGNOSIS — Z888 Allergy status to other drugs, medicaments and biological substances status: Secondary | ICD-10-CM | POA: Diagnosis not present

## 2017-12-20 DIAGNOSIS — Z886 Allergy status to analgesic agent status: Secondary | ICD-10-CM | POA: Diagnosis not present

## 2017-12-20 DIAGNOSIS — Z79899 Other long term (current) drug therapy: Secondary | ICD-10-CM | POA: Diagnosis not present

## 2017-12-20 DIAGNOSIS — Z880 Allergy status to penicillin: Secondary | ICD-10-CM | POA: Insufficient documentation

## 2017-12-20 DIAGNOSIS — Z170 Estrogen receptor positive status [ER+]: Secondary | ICD-10-CM | POA: Diagnosis not present

## 2017-12-20 DIAGNOSIS — Z923 Personal history of irradiation: Secondary | ICD-10-CM | POA: Insufficient documentation

## 2017-12-20 DIAGNOSIS — C50212 Malignant neoplasm of upper-inner quadrant of left female breast: Secondary | ICD-10-CM | POA: Insufficient documentation

## 2017-12-20 DIAGNOSIS — Z885 Allergy status to narcotic agent status: Secondary | ICD-10-CM | POA: Diagnosis not present

## 2017-12-20 NOTE — Progress Notes (Addendum)
Jessica [name] is here for follow up after treatment to her left breast.  She denies having pain but did mention that she is having some dysuria.  She was diagnosed with a UTI and just finished Macrobid last night.  She reports feeling fatigued.  The skin on her left breast has hyperpigmentation and a small amount of dry peeling over the nipple area.  BP (!) 151/80 (BP Location: Right Arm, Patient Position: Sitting)   Pulse 83   Temp 98.2 F (36.8 C) (Oral)   Ht 5\' 2"  (1.575 m)   Wt 165 lb 6.4 oz (75 kg)   SpO2 98%   BMI 30.25 kg/m    Wt Readings from Last 3 Encounters:  12/20/17 165 lb 6.4 oz (75 kg)  11/18/17 169 lb 4 oz (76.8 kg)  10/08/17 166 lb (75.3 kg)

## 2017-12-20 NOTE — Progress Notes (Signed)
Radiation Oncology         [phone] ________________________________  Name: Jessica [name] MRN: [id number]  Date: 12/20/2017  DOB: 1941-05-13  Follow-Up Visit Note  CC: [name], MD  Erroll Luna, MD    ICD-10-CM   1. Malignant neoplasm of upper-inner quadrant of left breast in female, estrogen receptor positive ([place]) C50.212    Z17.0     Diagnosis:   77 y.o. female with Stage IIA (pT2, pN0) grade 3 invasive carcinoma of the left (triple negative) breast  Interval Since Last Radiation:  1 month Radiation treatment dates:   10/13/2017 - 11/11/2017  Site/dose:   Left breast / 40.05 Gy in 15 fractions Boost / 10 Gy in 5 fractions  Narrative:  The patient returns today for routine follow-up of radiation completed 1 month ago to her left breast. The patient tolerated radiation treatment relatively well with mild fatigue and skin changes. Her skin was hyperpigmented and erythematous. Today, she denies any pain but did mention that she is having some dysuria. She was diagnosed with a UTI and just finished Macrobid last night. She denies hematuria. She reports continued fatigue and is taking naps to help with the fatigue.                   ALLERGIES:  is allergic to codeine; penicillins; advil [ibuprofen]; band-aid plus antibiotic [bacitracin-polymyxin b]; benazepril; and other.  Meds: Current Outpatient Medications  Medication Sig Dispense Refill  . diphenhydramine-acetaminophen (TYLENOL PM) 25-500 MG TABS Take 1 tablet by mouth at bedtime as needed (sleep).     . hyaluronate sodium (RADIAPLEXRX) GEL Apply 1 application topically once.    . hydrochlorothiazide (MICROZIDE) 12.5 MG capsule Take 12.5 mg by mouth daily.  1  . levothyroxine (SYNTHROID, LEVOTHROID) 75 MCG tablet Take 75 mcg by mouth daily.    Marland Kitchen lidocaine-prilocaine (EMLA) cream Apply 1 application topically as needed. 30 g 6  . LORazepam (ATIVAN) 0.5 MG tablet Take 1 tablet (0.5 mg total) by mouth every 6 (six)  hours as needed (Nausea or vomiting). 30 tablet 0  . Lysine 1000 MG TABS Take 1,000 mg by mouth as needed.    . Multiple Vitamin (MULTIVITAMIN) tablet Take 1 tablet by mouth daily.    Marland Kitchen omeprazole (PRILOSEC) 20 MG capsule     . OVER THE COUNTER MEDICATION Take 15 mLs by mouth 2 (two) times daily as needed (pain).    Marland Kitchen venlafaxine XR (EFFEXOR-XR) 75 MG 24 hr capsule Take 225 mg by mouth daily.    Marland Kitchen GENERLAC 10 GM/15ML SOLN TAKE 30 ML BY MOUTH EVERY 4 HOURS AS NEEDED FOR MILD CONSTIPATION UNTIL BOWEL MOVEMENT (Patient not taking: Reported on 12/20/2017) 21600 mL 3  . ondansetron (ZOFRAN) 8 MG tablet Take 1 tablet (8 mg total) by mouth 2 (two) times daily as needed for refractory nausea / vomiting. Start on day 3 after chemo. (Patient not taking: Reported on 12/20/2017) 30 tablet 1  . polyvinyl alcohol (LIQUIFILM TEARS) 1.4 % ophthalmic solution 1 drop as needed for dry eyes.    . potassium chloride SA (K-DUR,KLOR-CON) 20 MEQ tablet Take 2 tablets (40 mEq total) daily by mouth. (Patient not taking: Reported on 12/20/2017) 60 tablet 3  . prochlorperazine (COMPAZINE) 10 MG tablet TAKE 1 TABLET(10 MG) BY MOUTH EVERY 6 HOURS AS NEEDED FOR NAUSEA OR VOMITING (Patient not taking: Reported on 12/20/2017) 385 tablet 1   No current facility-administered medications for this encounter.     Physical Findings: The  patient is in no acute distress. Patient is alert and oriented.  height is 5\' 2"  (1.575 m) and weight is 165 lb 6.4 oz (75 kg). Her oral temperature is 98.2 F (36.8 C). Her blood pressure is 151/80 (abnormal) and her pulse is 83. Her oxygen saturation is 98%.   Lungs are clear to auscultation bilaterally. Heart has regular rate and rhythm. No palpable cervical, supraclavicular, or axillary adenopathy. Abdomen soft, non-tender, normal bowel sounds.  Breast Exam: Right breast has no palpable mass or nipple discharge. Left breast has mild hyperpigmentation changes and mild edema in the breast. No dominant  mass, nipple discharge, or bleeding. Skin healed well.  Lab Findings: Lab Results  Component Value Date   WBC 4.6 11/18/2017   HGB 10.9 (L) 10/08/2017   HCT 35.0 11/18/2017   MCV 101.7 (H) 11/18/2017   PLT 234 11/18/2017    Radiographic Findings: No results found.  Impression:  The patient is recovering from the effects of radiation.  No evidence of recurrence on clinical exam.   Plan:  Routine follow up with radiation oncology in 3 months.   ____________________________________  Blair Promise, PhD, MD  This document serves as a record of services personally performed by Gery Pray, MD. It was created on his behalf by Rae Lips, a trained medical scribe. The creation of this record is based on the scribe's personal observations and the provider's statements to them. This document has been checked and approved by the attending provider.

## 2017-12-21 DIAGNOSIS — E559 Vitamin D deficiency, unspecified: Secondary | ICD-10-CM | POA: Diagnosis not present

## 2017-12-21 DIAGNOSIS — I10 Essential (primary) hypertension: Secondary | ICD-10-CM | POA: Diagnosis not present

## 2017-12-21 DIAGNOSIS — Z780 Asymptomatic menopausal state: Secondary | ICD-10-CM | POA: Diagnosis not present

## 2017-12-21 DIAGNOSIS — E7800 Pure hypercholesterolemia, unspecified: Secondary | ICD-10-CM | POA: Diagnosis not present

## 2017-12-21 DIAGNOSIS — Z0000 Encounter for general adult medical examination without abnormal findings: Secondary | ICD-10-CM | POA: Diagnosis not present

## 2017-12-21 DIAGNOSIS — M8589 Other specified disorders of bone density and structure, multiple sites: Secondary | ICD-10-CM | POA: Diagnosis not present

## 2017-12-21 DIAGNOSIS — M8580 Other specified disorders of bone density and structure, unspecified site: Secondary | ICD-10-CM | POA: Diagnosis not present

## 2017-12-21 DIAGNOSIS — K219 Gastro-esophageal reflux disease without esophagitis: Secondary | ICD-10-CM | POA: Diagnosis not present

## 2017-12-21 DIAGNOSIS — E039 Hypothyroidism, unspecified: Secondary | ICD-10-CM | POA: Diagnosis not present

## 2017-12-28 ENCOUNTER — Encounter: Payer: Self-pay | Admitting: Radiation Oncology

## 2017-12-28 NOTE — Progress Notes (Signed)
Simulation verification  11/01/2017 The patient was brought to the treatment machine and placed in the plan treatment position.  Clinical set up was verified to ensure that the target region is appropriately covered for the patient's upcoming electron boost treatment.  The targeted volume of tissue is appropriately covered by the radiation field.  Based on my personal review, I approve the simulation verification.  The patient's treatment will proceed as planned.  ------------------------------------------------    Blair Promise, PhD, MD

## 2017-12-30 ENCOUNTER — Other Ambulatory Visit: Payer: Self-pay

## 2017-12-30 ENCOUNTER — Inpatient Hospital Stay: Payer: Medicare Other | Attending: Hematology & Oncology

## 2017-12-30 VITALS — BP 144/66 | HR 79 | Temp 97.8°F

## 2017-12-30 DIAGNOSIS — Z170 Estrogen receptor positive status [ER+]: Secondary | ICD-10-CM | POA: Diagnosis not present

## 2017-12-30 DIAGNOSIS — Z923 Personal history of irradiation: Secondary | ICD-10-CM | POA: Insufficient documentation

## 2017-12-30 DIAGNOSIS — Z9221 Personal history of antineoplastic chemotherapy: Secondary | ICD-10-CM | POA: Insufficient documentation

## 2017-12-30 DIAGNOSIS — C50212 Malignant neoplasm of upper-inner quadrant of left female breast: Secondary | ICD-10-CM

## 2017-12-30 DIAGNOSIS — Z452 Encounter for adjustment and management of vascular access device: Secondary | ICD-10-CM | POA: Diagnosis not present

## 2017-12-30 MED ORDER — SODIUM CHLORIDE 0.9% FLUSH
10.0000 mL | INTRAVENOUS | Status: DC | PRN
  Administered 2017-12-30: 10 mL via INTRAVENOUS
  Filled 2017-12-30: qty 10

## 2017-12-30 MED ORDER — HEPARIN SOD (PORK) LOCK FLUSH 100 UNIT/ML IV SOLN
500.0000 [IU] | Freq: Once | INTRAVENOUS | Status: AC
  Administered 2017-12-30: 500 [IU] via INTRAVENOUS
  Filled 2017-12-30: qty 5

## 2017-12-30 NOTE — Patient Instructions (Signed)
Implanted Port Home Guide An implanted port is a type of central line that is placed under the skin. Central lines are used to provide IV access when treatment or nutrition needs to be given through a person's veins. Implanted ports are used for long-term IV access. An implanted port may be placed because:  You need IV medicine that would be irritating to the small veins in your hands or arms.  You need long-term IV medicines, such as antibiotics.  You need IV nutrition for a long period.  You need frequent blood draws for lab tests.  You need dialysis.  Implanted ports are usually placed in the chest area, but they can also be placed in the upper arm, the abdomen, or the leg. An implanted port has two main parts:  Reservoir. The reservoir is round and will appear as a small, raised area under your skin. The reservoir is the part where a needle is inserted to give medicines or draw blood.  Catheter. The catheter is a thin, flexible tube that extends from the reservoir. The catheter is placed into a large vein. Medicine that is inserted into the reservoir goes into the catheter and then into the vein.  How will I care for my incision site? Do not get the incision site wet. Bathe or shower as directed by your health care provider. How is my port accessed? Special steps must be taken to access the port:  Before the port is accessed, a numbing cream can be placed on the skin. This helps numb the skin over the port site.  Your health care provider uses a sterile technique to access the port. ? Your health care provider must put on a mask and sterile gloves. ? The skin over your port is cleaned carefully with an antiseptic and allowed to dry. ? The port is gently pinched between sterile gloves, and a needle is inserted into the port.  Only "non-coring" port needles should be used to access the port. Once the port is accessed, a blood return should be checked. This helps ensure that the port  is in the vein and is not clogged.  If your port needs to remain accessed for a constant infusion, a clear (transparent) bandage will be placed over the needle site. The bandage and needle will need to be changed every week, or as directed by your health care provider.  Keep the bandage covering the needle clean and dry. Do not get it wet. Follow your health care provider's instructions on how to take a shower or bath while the port is accessed.  If your port does not need to stay accessed, no bandage is needed over the port.  What is flushing? Flushing helps keep the port from getting clogged. Follow your health care provider's instructions on how and when to flush the port. Ports are usually flushed with saline solution or a medicine called heparin. The need for flushing will depend on how the port is used.  If the port is used for intermittent medicines or blood draws, the port will need to be flushed: ? After medicines have been given. ? After blood has been drawn. ? As part of routine maintenance.  If a constant infusion is running, the port may not need to be flushed.  How long will my port stay implanted? The port can stay in for as long as your health care provider thinks it is needed. When it is time for the port to come out, surgery will be   done to remove it. The procedure is similar to the one performed when the port was put in. When should I seek immediate medical care? When you have an implanted port, you should seek immediate medical care if:  You notice a bad smell coming from the incision site.  You have swelling, redness, or drainage at the incision site.  You have more swelling or pain at the port site or the surrounding area.  You have a fever that is not controlled with medicine.  This information is not intended to replace advice given to you by your health care provider. Make sure you discuss any questions you have with your health care provider. Document  Released: 10/19/2005 Document Revised: 03/26/2016 Document Reviewed: 06/26/2013 Elsevier Interactive Patient Education  2017 Elsevier Inc[employer]  

## 2018-01-03 DIAGNOSIS — N390 Urinary tract infection, site not specified: Secondary | ICD-10-CM | POA: Diagnosis not present

## 2018-01-03 DIAGNOSIS — R300 Dysuria: Secondary | ICD-10-CM | POA: Diagnosis not present

## 2018-02-10 ENCOUNTER — Encounter: Payer: Self-pay | Admitting: Family

## 2018-02-10 ENCOUNTER — Other Ambulatory Visit: Payer: Self-pay

## 2018-02-10 ENCOUNTER — Inpatient Hospital Stay: Payer: Medicare Other

## 2018-02-10 ENCOUNTER — Inpatient Hospital Stay: Payer: Medicare Other | Attending: Hematology & Oncology | Admitting: Family

## 2018-02-10 VITALS — BP 142/70 | HR 76 | Temp 98.0°F | Resp 17 | Wt 167.0 lb

## 2018-02-10 DIAGNOSIS — Z885 Allergy status to narcotic agent status: Secondary | ICD-10-CM | POA: Diagnosis not present

## 2018-02-10 DIAGNOSIS — Z780 Asymptomatic menopausal state: Secondary | ICD-10-CM | POA: Diagnosis not present

## 2018-02-10 DIAGNOSIS — Z9221 Personal history of antineoplastic chemotherapy: Secondary | ICD-10-CM | POA: Insufficient documentation

## 2018-02-10 DIAGNOSIS — Z79899 Other long term (current) drug therapy: Secondary | ICD-10-CM

## 2018-02-10 DIAGNOSIS — Z95828 Presence of other vascular implants and grafts: Secondary | ICD-10-CM

## 2018-02-10 DIAGNOSIS — K5900 Constipation, unspecified: Secondary | ICD-10-CM | POA: Insufficient documentation

## 2018-02-10 DIAGNOSIS — R5383 Other fatigue: Secondary | ICD-10-CM | POA: Diagnosis not present

## 2018-02-10 DIAGNOSIS — E559 Vitamin D deficiency, unspecified: Secondary | ICD-10-CM

## 2018-02-10 DIAGNOSIS — C50212 Malignant neoplasm of upper-inner quadrant of left female breast: Secondary | ICD-10-CM | POA: Insufficient documentation

## 2018-02-10 DIAGNOSIS — Z923 Personal history of irradiation: Secondary | ICD-10-CM | POA: Diagnosis not present

## 2018-02-10 DIAGNOSIS — Z171 Estrogen receptor negative status [ER-]: Secondary | ICD-10-CM | POA: Insufficient documentation

## 2018-02-10 DIAGNOSIS — Z880 Allergy status to penicillin: Secondary | ICD-10-CM | POA: Insufficient documentation

## 2018-02-10 DIAGNOSIS — Z170 Estrogen receptor positive status [ER+]: Secondary | ICD-10-CM

## 2018-02-10 LAB — CBC WITH DIFFERENTIAL (CANCER CENTER ONLY)
Basophils Absolute: 0 10*3/uL (ref 0.0–0.1)
Basophils Relative: 0 %
Eosinophils Absolute: 0 10*3/uL (ref 0.0–0.5)
Eosinophils Relative: 0 %
HCT: 34.9 % (ref 34.8–46.6)
Hemoglobin: 11.4 g/dL — ABNORMAL LOW (ref 11.6–15.9)
Lymphocytes Relative: 22 %
Lymphs Abs: 0.9 10*3/uL (ref 0.9–3.3)
MCH: 31.1 pg (ref 26.0–34.0)
MCHC: 32.7 g/dL (ref 32.0–36.0)
MCV: 95.1 fL (ref 81.0–101.0)
Monocytes Absolute: 0.3 10*3/uL (ref 0.1–0.9)
Monocytes Relative: 7 %
Neutro Abs: 2.9 10*3/uL (ref 1.5–6.5)
Neutrophils Relative %: 71 %
Platelet Count: 220 10*3/uL (ref 145–400)
RBC: 3.67 MIL/uL — ABNORMAL LOW (ref 3.70–5.32)
RDW: 14 % (ref 11.1–15.7)
WBC Count: 4.1 10*3/uL (ref 3.9–10.0)

## 2018-02-10 LAB — COMPREHENSIVE METABOLIC PANEL
ALT: 17 U/L (ref 10–47)
AST: 25 U/L (ref 11–38)
Albumin: 3.5 g/dL (ref 3.5–5.0)
Alkaline Phosphatase: 60 U/L (ref 26–84)
Anion gap: 9 (ref 5–15)
BUN: 10 mg/dL (ref 7–22)
CO2: 31 mmol/L (ref 18–33)
Calcium: 9.4 mg/dL (ref 8.0–10.3)
Chloride: 101 mmol/L (ref 98–108)
Creatinine, Ser: 0.9 mg/dL (ref 0.60–1.20)
Glucose, Bld: 129 mg/dL — ABNORMAL HIGH (ref 73–118)
Potassium: 3.2 mmol/L — ABNORMAL LOW (ref 3.3–4.7)
Sodium: 141 mmol/L (ref 128–145)
Total Bilirubin: 0.6 mg/dL (ref 0.2–1.6)
Total Protein: 6.6 g/dL (ref 6.4–8.1)

## 2018-02-10 MED ORDER — HEPARIN SOD (PORK) LOCK FLUSH 100 UNIT/ML IV SOLN
500.0000 [IU] | Freq: Once | INTRAVENOUS | Status: AC
  Administered 2018-02-10: 500 [IU] via INTRAVENOUS
  Filled 2018-02-10: qty 5

## 2018-02-10 MED ORDER — SODIUM CHLORIDE 0.9% FLUSH
10.0000 mL | INTRAVENOUS | Status: DC | PRN
  Administered 2018-02-10: 10 mL via INTRAVENOUS
  Filled 2018-02-10: qty 10

## 2018-02-10 NOTE — Progress Notes (Signed)
Hematology and Oncology Follow Up Visit  Jessica [name] [id number] 10/16/1941 77 y.o. 02/10/2018   Principle Diagnosis:  Stage IIA (T2bN0M0) invasive ductal ca of the LEFT breast - TRIPLE NEGATIVE Staphylococcus aureus wound infection of the LEFT axilla Cellulitis of the left breast  Past Therapy:   Taxotere/Carboplatin - s/p cycle #4 -completed adjuvant therapy on 09/09/2017 Radiation therapy to the right breast.-Completed 4005 rad with an additional 1000 rad boost on 11/11/2017  Current Therapy: Observation   Interim History:  Jessica [name] is here today with her daughter for follow-up. She is doing well. She still has some fatigue and will take breaks to rest as needed.  Breast exam today was unremarkable. No changes.  No lymphadenopathy noted on exam.  She has had no issue with infection. No fever, chills, n/v, cough, rash, dizziness, SOB, chest pain, palpitations, abdominal pain or changes in bowel or bladder habits.  She has occasional constipation and will take lactulose if needed.  No episodes of bleeding, no bruising or petechiae.  No swelling, tenderness, numbness or tingling in her extremities. No c/o pain. She has a good appetite and is staying well hydrated. Her weight is stable.    ECOG Performance Status: 1 - Symptomatic but completely ambulatory  Medications:  Allergies as of 02/10/2018      Reactions   Codeine Hives   Penicillins Hives   Has patient had a PCN reaction causing immediate rash, facial/tongue/throat swelling, SOB or lightheadedness with hypotension: yes Has patient had a PCN reaction causing severe rash involving mucus membranes or skin necrosis:no Has patient had a PCN reaction that required hospitalization:no Has patient had a PCN reaction occurring within the last 10 years: no If all of the above answers are "NO", then may proceed with Cephalosporin use. Has patient had a PCN reaction causing immediate rash, facial/tongue/throat swelling,  SOB or lightheadedness with hypotension: yes Has patient had a PCN reaction causing severe rash involving mucus membranes or skin necrosis:  no Has patient had a PCN reaction that required hospitalization:  no Has patient had a PCN reaction occurring within the last 10 years: no If all of the above answers are "NO", then may proceed with Cephalosporin use.   Advil [ibuprofen] Hives, Itching   Band-aid Plus Antibiotic [bacitracin-polymyxin B] Dermatitis   Benazepril Swelling   Possible cause of tongue swelling   Other Rash   Band-aid      Medication List        Accurate as of 02/10/18 12:58 PM. Always use your most recent med list.          diphenhydramine-acetaminophen 25-500 MG Tabs tablet Commonly known as:  TYLENOL PM Take 1 tablet by mouth at bedtime as needed (sleep).   GENERLAC 10 GM/15ML Soln Generic drug:  lactulose (encephalopathy) TAKE 30 ML BY MOUTH EVERY 4 HOURS AS NEEDED FOR MILD CONSTIPATION UNTIL BOWEL MOVEMENT   hyaluronate sodium Gel Apply 1 application topically once.   hydrochlorothiazide 12.5 MG capsule Commonly known as:  MICROZIDE Take 12.5 mg by mouth daily.   levothyroxine 75 MCG tablet Commonly known as:  SYNTHROID, LEVOTHROID Take 75 mcg by mouth daily.   lidocaine-prilocaine cream Commonly known as:  EMLA Apply 1 application topically as needed.   LORazepam 0.5 MG tablet Commonly known as:  ATIVAN Take 1 tablet (0.5 mg total) by mouth every 6 (six) hours as needed (Nausea or vomiting).   Lysine 1000 MG Tabs Take 1,000 mg by mouth as needed.   multivitamin tablet Take  1 tablet by mouth daily.   omeprazole 20 MG capsule Commonly known as:  PRILOSEC   ondansetron 8 MG tablet Commonly known as:  ZOFRAN Take 1 tablet (8 mg total) by mouth 2 (two) times daily as needed for refractory nausea / vomiting. Start on day 3 after chemo.   OVER THE COUNTER MEDICATION Take 15 mLs by mouth 2 (two) times daily as needed (pain).   polyvinyl  alcohol 1.4 % ophthalmic solution Commonly known as:  LIQUIFILM TEARS 1 drop as needed for dry eyes.   potassium chloride SA 20 MEQ tablet Commonly known as:  K-DUR,KLOR-CON Take 2 tablets (40 mEq total) daily by mouth.   prochlorperazine 10 MG tablet Commonly known as:  COMPAZINE TAKE 1 TABLET(10 MG) BY MOUTH EVERY 6 HOURS AS NEEDED FOR NAUSEA OR VOMITING   venlafaxine XR 75 MG 24 hr capsule Commonly known as:  EFFEXOR-XR Take 225 mg by mouth daily.       Allergies:  Allergies  Allergen Reactions  . Codeine Hives  . Penicillins Hives    Has patient had a PCN reaction causing immediate rash, facial/tongue/throat swelling, SOB or lightheadedness with hypotension: yes Has patient had a PCN reaction causing severe rash involving mucus membranes or skin necrosis:no Has patient had a PCN reaction that required hospitalization:no Has patient had a PCN reaction occurring within the last 10 years: no If all of the above answers are "NO", then may proceed with Cephalosporin use. Has patient had a PCN reaction causing immediate rash, facial/tongue/throat swelling, SOB or lightheadedness with hypotension: yes Has patient had a PCN reaction causing severe rash involving mucus membranes or skin necrosis:  no Has patient had a PCN reaction that required hospitalization:  no Has patient had a PCN reaction occurring within the last 10 years: no If all of the above answers are "NO", then may proceed with Cephalosporin use.   . Advil [Ibuprofen] Hives and Itching  . Band-Aid Plus Antibiotic [Bacitracin-Polymyxin B] Dermatitis  . Benazepril Swelling    Possible cause of tongue swelling  . Other Rash    Band-aid    Past Medical History, Surgical history, Social history, and Family History were reviewed and updated.  Review of Systems: All other 10 point review of systems is negative.   Physical Exam:  weight is 167 lb (75.8 kg). Her oral temperature is 98 F (36.7 C). Her blood  pressure is 142/70 (abnormal) and her pulse is 76. Her respiration is 17 and oxygen saturation is 98%.   Wt Readings from Last 3 Encounters:  02/10/18 167 lb (75.8 kg)  12/20/17 165 lb 6.4 oz (75 kg)  11/18/17 169 lb 4 oz (76.8 kg)    Ocular: Sclerae unicteric, pupils equal, round and reactive to light Ear-nose-throat: Oropharynx clear, dentition fair Lymphatic: No cervical, supraclavicular or axillary adenopathy Lungs no rales or rhonchi, good excursion bilaterally Heart regular rate and rhythm, no murmur appreciated Abd soft, nontender, positive bowel sounds, no liver or spleen tip palpated on exam, no fluid wave  MSK no focal spinal tenderness, no joint edema Neuro: non-focal, well-oriented, appropriate affect Breasts: No changes noted on exam today, no mass, lesion or rash noted  Lab Results  Component Value Date   WBC 4.1 02/10/2018   HGB 10.9 (L) 10/08/2017   HCT 34.9 02/10/2018   MCV 95.1 02/10/2018   PLT 220 02/10/2018   No results found for: FERRITIN, IRON, TIBC, UIBC, IRONPCTSAT Lab Results  Component Value Date   RBC 3.67 (L) 02/10/2018  No results found for: KPAFRELGTCHN, LAMBDASER, KAPLAMBRATIO No results found for: IGGSERUM, IGA, IGMSERUM No results found for: Odetta Pink, SPEI   Chemistry      Component Value Date/Time   NA 141 02/10/2018 1000   NA 143 10/08/2017 1042   NA 141 06/16/2017 1014   K 3.2 (L) 02/10/2018 1000   K 3.4 10/08/2017 1042   K 3.8 06/16/2017 1014   CL 101 02/10/2018 1000   CL 100 10/08/2017 1042   CO2 31 02/10/2018 1000   CO2 30 10/08/2017 1042   CO2 31 (H) 06/16/2017 1014   BUN 10 02/10/2018 1000   BUN 10 10/08/2017 1042   BUN 8.9 06/16/2017 1014   CREATININE 0.90 02/10/2018 1000   CREATININE 0.9 10/08/2017 1042   CREATININE 0.8 06/16/2017 1014      Component Value Date/Time   CALCIUM 9.4 02/10/2018 1000   CALCIUM 9.5 10/08/2017 1042   CALCIUM 10.1 06/16/2017 1014    ALKPHOS 60 02/10/2018 1000   ALKPHOS 68 10/08/2017 1042   ALKPHOS 64 06/16/2017 1014   AST 25 02/10/2018 1000   AST 20 10/08/2017 1042   AST 21 06/16/2017 1014   ALT 17 02/10/2018 1000   ALT 20 10/08/2017 1042   ALT 20 06/16/2017 1014   BILITOT 0.6 02/10/2018 1000   BILITOT 0.70 10/08/2017 1042   BILITOT 0.37 06/16/2017 1014      Impression and Plan: Jessica [name] is a very pleasant 77 yo caucasian female with stage IIA ductal carcinoma of the left breast, Triple negative. She had her partial mastectomy in May 2018 followed by chemotherapy and radiation. She is doing well now and has no complaints at this time other than fatigue.  We will plan to see her back in another 3 months for follow-up.  She will contact our office with any questions or concerns. We can certainly see her sooner if need be.   Laverna Peace, NP 4/11/201912:58 PM

## 2018-02-15 ENCOUNTER — Other Ambulatory Visit: Payer: Self-pay | Admitting: Family

## 2018-02-15 DIAGNOSIS — C50212 Malignant neoplasm of upper-inner quadrant of left female breast: Secondary | ICD-10-CM

## 2018-02-15 DIAGNOSIS — Z170 Estrogen receptor positive status [ER+]: Secondary | ICD-10-CM

## 2018-02-16 DIAGNOSIS — Z8249 Family history of ischemic heart disease and other diseases of the circulatory system: Secondary | ICD-10-CM | POA: Diagnosis not present

## 2018-02-16 DIAGNOSIS — K219 Gastro-esophageal reflux disease without esophagitis: Secondary | ICD-10-CM | POA: Diagnosis not present

## 2018-02-16 DIAGNOSIS — R079 Chest pain, unspecified: Secondary | ICD-10-CM | POA: Diagnosis not present

## 2018-03-04 DIAGNOSIS — J45998 Other asthma: Secondary | ICD-10-CM | POA: Diagnosis not present

## 2018-03-04 DIAGNOSIS — R079 Chest pain, unspecified: Secondary | ICD-10-CM | POA: Diagnosis not present

## 2018-03-04 DIAGNOSIS — I10 Essential (primary) hypertension: Secondary | ICD-10-CM | POA: Diagnosis not present

## 2018-03-04 DIAGNOSIS — E785 Hyperlipidemia, unspecified: Secondary | ICD-10-CM | POA: Diagnosis not present

## 2018-03-07 ENCOUNTER — Other Ambulatory Visit: Payer: Self-pay | Admitting: Cardiology

## 2018-03-07 ENCOUNTER — Ambulatory Visit
Admission: RE | Admit: 2018-03-07 | Discharge: 2018-03-07 | Disposition: A | Discharge status: Home / Self Care | Payer: Medicare Other | Source: Ambulatory Visit | Attending: Cardiology | Admitting: Cardiology

## 2018-03-07 DIAGNOSIS — R079 Chest pain, unspecified: Secondary | ICD-10-CM | POA: Diagnosis not present

## 2018-03-14 ENCOUNTER — Ambulatory Visit
Admission: RE | Admit: 2018-03-14 | Discharge: 2018-03-14 | Disposition: A | Discharge status: Home / Self Care | Payer: Medicare Other | Source: Ambulatory Visit | Attending: Family | Admitting: Family

## 2018-03-14 DIAGNOSIS — C50212 Malignant neoplasm of upper-inner quadrant of left female breast: Secondary | ICD-10-CM

## 2018-03-14 DIAGNOSIS — R928 Other abnormal and inconclusive findings on diagnostic imaging of breast: Secondary | ICD-10-CM | POA: Diagnosis not present

## 2018-03-14 DIAGNOSIS — Z170 Estrogen receptor positive status [ER+]: Secondary | ICD-10-CM

## 2018-03-14 HISTORY — DX: Personal history of antineoplastic chemotherapy: Z92.21

## 2018-03-14 HISTORY — DX: Personal history of irradiation: Z92.3

## 2018-03-25 DIAGNOSIS — R079 Chest pain, unspecified: Secondary | ICD-10-CM | POA: Diagnosis not present

## 2018-03-25 DIAGNOSIS — E785 Hyperlipidemia, unspecified: Secondary | ICD-10-CM | POA: Diagnosis not present

## 2018-03-25 DIAGNOSIS — E039 Hypothyroidism, unspecified: Secondary | ICD-10-CM | POA: Diagnosis not present

## 2018-05-12 ENCOUNTER — Encounter: Payer: Self-pay | Admitting: Hematology & Oncology

## 2018-05-12 ENCOUNTER — Inpatient Hospital Stay: Payer: Medicare Other

## 2018-05-12 ENCOUNTER — Inpatient Hospital Stay: Payer: Medicare Other | Attending: Hematology & Oncology

## 2018-05-12 ENCOUNTER — Other Ambulatory Visit: Payer: Self-pay

## 2018-05-12 ENCOUNTER — Inpatient Hospital Stay (HOSPITAL_BASED_OUTPATIENT_CLINIC_OR_DEPARTMENT_OTHER): Payer: Medicare Other | Admitting: Hematology & Oncology

## 2018-05-12 VITALS — BP 162/75 | HR 80 | Temp 98.2°F | Resp 18 | Wt 166.0 lb

## 2018-05-12 DIAGNOSIS — Z853 Personal history of malignant neoplasm of breast: Secondary | ICD-10-CM

## 2018-05-12 DIAGNOSIS — Z171 Estrogen receptor negative status [ER-]: Secondary | ICD-10-CM | POA: Insufficient documentation

## 2018-05-12 DIAGNOSIS — Z170 Estrogen receptor positive status [ER+]: Secondary | ICD-10-CM

## 2018-05-12 DIAGNOSIS — Z9221 Personal history of antineoplastic chemotherapy: Secondary | ICD-10-CM

## 2018-05-12 DIAGNOSIS — L03112 Cellulitis of left axilla: Secondary | ICD-10-CM | POA: Insufficient documentation

## 2018-05-12 DIAGNOSIS — C50212 Malignant neoplasm of upper-inner quadrant of left female breast: Secondary | ICD-10-CM | POA: Diagnosis not present

## 2018-05-12 DIAGNOSIS — Z923 Personal history of irradiation: Secondary | ICD-10-CM

## 2018-05-12 DIAGNOSIS — Z79899 Other long term (current) drug therapy: Secondary | ICD-10-CM | POA: Diagnosis not present

## 2018-05-12 DIAGNOSIS — E559 Vitamin D deficiency, unspecified: Secondary | ICD-10-CM

## 2018-05-12 DIAGNOSIS — Z872 Personal history of diseases of the skin and subcutaneous tissue: Secondary | ICD-10-CM | POA: Insufficient documentation

## 2018-05-12 DIAGNOSIS — Z95828 Presence of other vascular implants and grafts: Secondary | ICD-10-CM

## 2018-05-12 LAB — CMP (CANCER CENTER ONLY)
ALT: 19 U/L (ref 10–47)
AST: 25 U/L (ref 11–38)
Albumin: 3.3 g/dL — ABNORMAL LOW (ref 3.5–5.0)
Alkaline Phosphatase: 62 U/L (ref 26–84)
Anion gap: 9 (ref 5–15)
BUN: 11 mg/dL (ref 7–22)
CO2: 31 mmol/L (ref 18–33)
Calcium: 9.3 mg/dL (ref 8.0–10.3)
Chloride: 100 mmol/L (ref 98–108)
Creatinine: 0.7 mg/dL (ref 0.60–1.20)
Glucose, Bld: 117 mg/dL (ref 73–118)
Potassium: 3.2 mmol/L — ABNORMAL LOW (ref 3.3–4.7)
Sodium: 140 mmol/L (ref 128–145)
Total Bilirubin: 0.6 mg/dL (ref 0.2–1.6)
Total Protein: 6.9 g/dL (ref 6.4–8.1)

## 2018-05-12 LAB — CBC WITH DIFFERENTIAL (CANCER CENTER ONLY)
Basophils Absolute: 0 10*3/uL (ref 0.0–0.1)
Basophils Relative: 0 %
Eosinophils Absolute: 0 10*3/uL (ref 0.0–0.5)
Eosinophils Relative: 0 %
HCT: 34.8 % (ref 34.8–46.6)
Hemoglobin: 11.1 g/dL — ABNORMAL LOW (ref 11.6–15.9)
Lymphocytes Relative: 30 %
Lymphs Abs: 1.2 10*3/uL (ref 0.9–3.3)
MCH: 30.6 pg (ref 26.0–34.0)
MCHC: 31.9 g/dL — ABNORMAL LOW (ref 32.0–36.0)
MCV: 95.9 fL (ref 81.0–101.0)
Monocytes Absolute: 0.4 10*3/uL (ref 0.1–0.9)
Monocytes Relative: 9 %
Neutro Abs: 2.5 10*3/uL (ref 1.5–6.5)
Neutrophils Relative %: 61 %
Platelet Count: 219 10*3/uL (ref 145–400)
RBC: 3.63 MIL/uL — ABNORMAL LOW (ref 3.70–5.32)
RDW: 14.3 % (ref 11.1–15.7)
WBC Count: 4.1 10*3/uL (ref 3.9–10.0)

## 2018-05-12 MED ORDER — HEPARIN SOD (PORK) LOCK FLUSH 100 UNIT/ML IV SOLN
500.0000 [IU] | Freq: Once | INTRAVENOUS | Status: AC
  Administered 2018-05-12: 500 [IU] via INTRAVENOUS
  Filled 2018-05-12: qty 5

## 2018-05-12 MED ORDER — SODIUM CHLORIDE 0.9% FLUSH
10.0000 mL | INTRAVENOUS | Status: DC | PRN
  Administered 2018-05-12: 10 mL via INTRAVENOUS
  Filled 2018-05-12: qty 10

## 2018-05-12 NOTE — Progress Notes (Signed)
Hematology and Oncology Follow Up Visit  Jessica [name] [id number] 06-Nov-1940 77 y.o. 05/12/2018   Principle Diagnosis:  Stage IIA (T2bN0M0) invasive ductal ca of the LEFT breast - TRIPLE NEGATIVE Staphylococcus aureus wound infection of the LEFT axilla Cellulitis of the left breast  Past Therapy:   Taxotere/Carboplatin - s/p cycle #4 -completed adjuvant therapy on 09/09/2017 Radiation therapy to the right breast.-Completed 4005 rad with an additional 1000 rad boost on 11/11/2017  Current Therapy: Observation   Interim History:  Jessica [name] is here today with her neighbor for follow-up.  She is doing quite well.  She really has had no complaints.  There is been no issues with weakness or fatigue.  Her hair is come back quite nicely.  She is had no fever.  She is had no cough.  She is had no nausea or vomiting.  There is been no change in bowel or bladder habits.  She has had no rashes.  Overall, her performance status is ECOG 1.  Medications:  Allergies as of 05/12/2018      Reactions   Codeine Hives   Penicillins Hives   Has patient had a PCN reaction causing immediate rash, facial/tongue/throat swelling, SOB or lightheadedness with hypotension: yes Has patient had a PCN reaction causing severe rash involving mucus membranes or skin necrosis:no Has patient had a PCN reaction that required hospitalization:no Has patient had a PCN reaction occurring within the last 10 years: no If all of the above answers are "NO", then may proceed with Cephalosporin use. Has patient had a PCN reaction causing immediate rash, facial/tongue/throat swelling, SOB or lightheadedness with hypotension: yes Has patient had a PCN reaction causing severe rash involving mucus membranes or skin necrosis:  no Has patient had a PCN reaction that required hospitalization:  no Has patient had a PCN reaction occurring within the last 10 years: no If all of the above answers are "NO", then may proceed with  Cephalosporin use.   Advil [ibuprofen] Hives, Itching   Band-aid Plus Antibiotic [bacitracin-polymyxin B] Dermatitis   Benazepril Swelling   Possible cause of tongue swelling   Other Rash   Band-aid      Medication List        Accurate as of 05/12/18  9:44 AM. Always use your most recent med list.          diphenhydramine-acetaminophen 25-500 MG Tabs tablet Commonly known as:  TYLENOL PM Take 1 tablet by mouth at bedtime as needed (sleep).   GENERLAC 10 GM/15ML Soln Generic drug:  lactulose (encephalopathy) TAKE 30 ML BY MOUTH EVERY 4 HOURS AS NEEDED FOR MILD CONSTIPATION UNTIL BOWEL MOVEMENT   hyaluronate sodium Gel Apply 1 application topically once.   hydrochlorothiazide 12.5 MG capsule Commonly known as:  MICROZIDE Take 12.5 mg by mouth daily.   levothyroxine 75 MCG tablet Commonly known as:  SYNTHROID, LEVOTHROID Take 75 mcg by mouth daily.   lidocaine-prilocaine cream Commonly known as:  EMLA Apply 1 application topically as needed.   LORazepam 0.5 MG tablet Commonly known as:  ATIVAN Take 1 tablet (0.5 mg total) by mouth every 6 (six) hours as needed (Nausea or vomiting).   Lysine 1000 MG Tabs Take 1,000 mg by mouth as needed.   multivitamin tablet Take 1 tablet by mouth daily.   omeprazole 20 MG capsule Commonly known as:  PRILOSEC   ondansetron 8 MG tablet Commonly known as:  ZOFRAN Take 1 tablet (8 mg total) by mouth 2 (two) times daily as needed  for refractory nausea / vomiting. Start on day 3 after chemo.   OVER THE COUNTER MEDICATION Take 15 mLs by mouth 2 (two) times daily as needed (pain).   polyvinyl alcohol 1.4 % ophthalmic solution Commonly known as:  LIQUIFILM TEARS 1 drop as needed for dry eyes.   potassium chloride SA 20 MEQ tablet Commonly known as:  K-DUR,KLOR-CON Take 2 tablets (40 mEq total) daily by mouth.   prochlorperazine 10 MG tablet Commonly known as:  COMPAZINE TAKE 1 TABLET(10 MG) BY MOUTH EVERY 6 HOURS AS NEEDED  FOR NAUSEA OR VOMITING   venlafaxine XR 75 MG 24 hr capsule Commonly known as:  EFFEXOR-XR Take 225 mg by mouth daily.       Allergies:  Allergies  Allergen Reactions  . Codeine Hives  . Penicillins Hives    Has patient had a PCN reaction causing immediate rash, facial/tongue/throat swelling, SOB or lightheadedness with hypotension: yes Has patient had a PCN reaction causing severe rash involving mucus membranes or skin necrosis:no Has patient had a PCN reaction that required hospitalization:no Has patient had a PCN reaction occurring within the last 10 years: no If all of the above answers are "NO", then may proceed with Cephalosporin use. Has patient had a PCN reaction causing immediate rash, facial/tongue/throat swelling, SOB or lightheadedness with hypotension: yes Has patient had a PCN reaction causing severe rash involving mucus membranes or skin necrosis:  no Has patient had a PCN reaction that required hospitalization:  no Has patient had a PCN reaction occurring within the last 10 years: no If all of the above answers are "NO", then may proceed with Cephalosporin use.   . Advil [Ibuprofen] Hives and Itching  . Band-Aid Plus Antibiotic [Bacitracin-Polymyxin B] Dermatitis  . Benazepril Swelling    Possible cause of tongue swelling  . Other Rash    Band-aid    Past Medical History, Surgical history, Social history, and Family History were reviewed and updated.  Review of Systems: Review of Systems  Constitutional: Negative.   HENT: Negative.   Eyes: Negative.   Respiratory: Negative.   Cardiovascular: Negative.   Gastrointestinal: Negative.   Genitourinary: Negative.   Musculoskeletal: Negative.   Skin: Negative.   Neurological: Negative.   Endo/Heme/Allergies: Negative.   Psychiatric/Behavioral: Negative.      Physical Exam:  vitals were not taken for this visit.   Wt Readings from Last 3 Encounters:  02/10/18 167 lb (75.8 kg)  12/20/17 165 lb 6.4  oz (75 kg)  11/18/17 169 lb 4 oz (76.8 kg)    Physical Exam  Constitutional: She is oriented to person, place, and time.  HENT:  Head: Normocephalic and atraumatic.  Mouth/Throat: Oropharynx is clear and moist.  Eyes: Pupils are equal, round, and reactive to light. EOM are normal.  Neck: Normal range of motion.  Cardiovascular: Normal rate, regular rhythm and normal heart sounds.  Pulmonary/Chest: Effort normal and breath sounds normal.  Abdominal: Soft. Bowel sounds are normal.  Musculoskeletal: Normal range of motion. She exhibits no edema, tenderness or deformity.  Lymphadenopathy:    She has no cervical adenopathy.  Neurological: She is alert and oriented to person, place, and time.  Skin: Skin is warm and dry. No rash noted. No erythema.  Psychiatric: She has a normal mood and affect. Her behavior is normal. Judgment and thought content normal.  Vitals reviewed.    Lab Results  Component Value Date   WBC 4.1 05/12/2018   HGB 11.1 (L) 05/12/2018   HCT 34.8  05/12/2018   MCV 95.9 05/12/2018   PLT 219 05/12/2018   No results found for: FERRITIN, IRON, TIBC, UIBC, IRONPCTSAT Lab Results  Component Value Date   RBC 3.63 (L) 05/12/2018   No results found for: KPAFRELGTCHN, LAMBDASER, KAPLAMBRATIO No results found for: IGGSERUM, IGA, IGMSERUM No results found for: Odetta Pink, SPEI   Chemistry      Component Value Date/Time   NA 141 02/10/2018 1000   NA 143 10/08/2017 1042   NA 141 06/16/2017 1014   K 3.2 (L) 02/10/2018 1000   K 3.4 10/08/2017 1042   K 3.8 06/16/2017 1014   CL 101 02/10/2018 1000   CL 100 10/08/2017 1042   CO2 31 02/10/2018 1000   CO2 30 10/08/2017 1042   CO2 31 (H) 06/16/2017 1014   BUN 10 02/10/2018 1000   BUN 10 10/08/2017 1042   BUN 8.9 06/16/2017 1014   CREATININE 0.90 02/10/2018 1000   CREATININE 0.9 10/08/2017 1042   CREATININE 0.8 06/16/2017 1014      Component Value Date/Time    CALCIUM 9.4 02/10/2018 1000   CALCIUM 9.5 10/08/2017 1042   CALCIUM 10.1 06/16/2017 1014   ALKPHOS 60 02/10/2018 1000   ALKPHOS 68 10/08/2017 1042   ALKPHOS 64 06/16/2017 1014   AST 25 02/10/2018 1000   AST 20 10/08/2017 1042   AST 21 06/16/2017 1014   ALT 17 02/10/2018 1000   ALT 20 10/08/2017 1042   ALT 20 06/16/2017 1014   BILITOT 0.6 02/10/2018 1000   BILITOT 0.70 10/08/2017 1042   BILITOT 0.37 06/16/2017 1014      Impression and Plan: Jessica [name] is a very pleasant 77 yo caucasian female with stage IIA ductal carcinoma of the left breast.  S TRIPLE NEGATIVE disease.   She had her partial mastectomy in May 2018 followed by chemotherapy and radiation.   Right now, I do not see any evidence of recurrent disease.  Everything looks quite well.  I think we get her back in 3 more months.  I think this would be reasonable.    [name], MD 7/11/20199:44 AM

## 2018-05-13 LAB — VITAMIN D 25 HYDROXY (VIT D DEFICIENCY, FRACTURES): Vit D, 25-Hydroxy: 37.1 ng/mL (ref 30.0–100.0)

## 2018-06-21 DIAGNOSIS — E7800 Pure hypercholesterolemia, unspecified: Secondary | ICD-10-CM | POA: Diagnosis not present

## 2018-06-23 ENCOUNTER — Inpatient Hospital Stay: Payer: Medicare Other | Attending: Hematology & Oncology

## 2018-06-23 VITALS — BP 144/64 | HR 72 | Temp 98.2°F | Resp 17

## 2018-06-23 DIAGNOSIS — Z171 Estrogen receptor negative status [ER-]: Secondary | ICD-10-CM | POA: Insufficient documentation

## 2018-06-23 DIAGNOSIS — Z872 Personal history of diseases of the skin and subcutaneous tissue: Secondary | ICD-10-CM | POA: Insufficient documentation

## 2018-06-23 DIAGNOSIS — Z9221 Personal history of antineoplastic chemotherapy: Secondary | ICD-10-CM | POA: Insufficient documentation

## 2018-06-23 DIAGNOSIS — Z923 Personal history of irradiation: Secondary | ICD-10-CM | POA: Diagnosis not present

## 2018-06-23 DIAGNOSIS — Z853 Personal history of malignant neoplasm of breast: Secondary | ICD-10-CM | POA: Insufficient documentation

## 2018-06-23 DIAGNOSIS — Z452 Encounter for adjustment and management of vascular access device: Secondary | ICD-10-CM | POA: Diagnosis not present

## 2018-06-23 DIAGNOSIS — Z95828 Presence of other vascular implants and grafts: Secondary | ICD-10-CM

## 2018-06-23 MED ORDER — HEPARIN SOD (PORK) LOCK FLUSH 100 UNIT/ML IV SOLN
500.0000 [IU] | Freq: Once | INTRAVENOUS | Status: AC
  Administered 2018-06-23: 500 [IU] via INTRAVENOUS
  Filled 2018-06-23: qty 5

## 2018-06-23 MED ORDER — SODIUM CHLORIDE 0.9% FLUSH
10.0000 mL | INTRAVENOUS | Status: DC | PRN
  Administered 2018-06-23: 10 mL via INTRAVENOUS
  Filled 2018-06-23: qty 10

## 2018-06-23 NOTE — Patient Instructions (Signed)
Implanted Port Home Guide An implanted port is a type of central line that is placed under the skin. Central lines are used to provide IV access when treatment or nutrition needs to be given through a person's veins. Implanted ports are used for long-term IV access. An implanted port may be placed because:  You need IV medicine that would be irritating to the small veins in your hands or arms.  You need long-term IV medicines, such as antibiotics.  You need IV nutrition for a long period.  You need frequent blood draws for lab tests.  You need dialysis.  Implanted ports are usually placed in the chest area, but they can also be placed in the upper arm, the abdomen, or the leg. An implanted port has two main parts:  Reservoir. The reservoir is round and will appear as a small, raised area under your skin. The reservoir is the part where a needle is inserted to give medicines or draw blood.  Catheter. The catheter is a thin, flexible tube that extends from the reservoir. The catheter is placed into a large vein. Medicine that is inserted into the reservoir goes into the catheter and then into the vein.  How will I care for my incision site? Do not get the incision site wet. Bathe or shower as directed by your health care provider. How is my port accessed? Special steps must be taken to access the port:  Before the port is accessed, a numbing cream can be placed on the skin. This helps numb the skin over the port site.  Your health care provider uses a sterile technique to access the port. ? Your health care provider must put on a mask and sterile gloves. ? The skin over your port is cleaned carefully with an antiseptic and allowed to dry. ? The port is gently pinched between sterile gloves, and a needle is inserted into the port.  Only "non-coring" port needles should be used to access the port. Once the port is accessed, a blood return should be checked. This helps ensure that the port  is in the vein and is not clogged.  If your port needs to remain accessed for a constant infusion, a clear (transparent) bandage will be placed over the needle site. The bandage and needle will need to be changed every week, or as directed by your health care provider.  Keep the bandage covering the needle clean and dry. Do not get it wet. Follow your health care provider's instructions on how to take a shower or bath while the port is accessed.  If your port does not need to stay accessed, no bandage is needed over the port.  What is flushing? Flushing helps keep the port from getting clogged. Follow your health care provider's instructions on how and when to flush the port. Ports are usually flushed with saline solution or a medicine called heparin. The need for flushing will depend on how the port is used.  If the port is used for intermittent medicines or blood draws, the port will need to be flushed: ? After medicines have been given. ? After blood has been drawn. ? As part of routine maintenance.  If a constant infusion is running, the port may not need to be flushed.  How long will my port stay implanted? The port can stay in for as long as your health care provider thinks it is needed. When it is time for the port to come out, surgery will be   done to remove it. The procedure is similar to the one performed when the port was put in. When should I seek immediate medical care? When you have an implanted port, you should seek immediate medical care if:  You notice a bad smell coming from the incision site.  You have swelling, redness, or drainage at the incision site.  You have more swelling or pain at the port site or the surrounding area.  You have a fever that is not controlled with medicine.  This information is not intended to replace advice given to you by your health care provider. Make sure you discuss any questions you have with your health care provider. Document  Released: 10/19/2005 Document Revised: 03/26/2016 Document Reviewed: 06/26/2013 Elsevier Interactive Patient Education  2017 Elsevier Inc[employer]  

## 2018-06-28 DIAGNOSIS — E039 Hypothyroidism, unspecified: Secondary | ICD-10-CM | POA: Diagnosis not present

## 2018-06-28 DIAGNOSIS — I10 Essential (primary) hypertension: Secondary | ICD-10-CM | POA: Diagnosis not present

## 2018-06-28 DIAGNOSIS — C50212 Malignant neoplasm of upper-inner quadrant of left female breast: Secondary | ICD-10-CM | POA: Diagnosis not present

## 2018-06-28 DIAGNOSIS — K219 Gastro-esophageal reflux disease without esophagitis: Secondary | ICD-10-CM | POA: Diagnosis not present

## 2018-07-27 ENCOUNTER — Telehealth: Payer: Self-pay | Admitting: *Deleted

## 2018-07-27 NOTE — Telephone Encounter (Signed)
Patient's daughter wants to make sure the patient can have a flu shot. Checked with Dr Marin Olp and he is okay with patient having a flu shot. Daughter, [name], is aware.

## 2018-08-12 ENCOUNTER — Encounter: Payer: Self-pay | Admitting: Hematology & Oncology

## 2018-08-12 ENCOUNTER — Inpatient Hospital Stay: Payer: Medicare Other | Attending: Hematology & Oncology | Admitting: Hematology & Oncology

## 2018-08-12 ENCOUNTER — Inpatient Hospital Stay: Payer: Medicare Other

## 2018-08-12 ENCOUNTER — Other Ambulatory Visit: Payer: Self-pay

## 2018-08-12 VITALS — BP 152/67 | HR 73 | Temp 98.5°F | Resp 20 | Wt 169.0 lb

## 2018-08-12 DIAGNOSIS — D508 Other iron deficiency anemias: Secondary | ICD-10-CM

## 2018-08-12 DIAGNOSIS — L03112 Cellulitis of left axilla: Secondary | ICD-10-CM | POA: Diagnosis not present

## 2018-08-12 DIAGNOSIS — Z9221 Personal history of antineoplastic chemotherapy: Secondary | ICD-10-CM | POA: Diagnosis not present

## 2018-08-12 DIAGNOSIS — N61 Mastitis without abscess: Secondary | ICD-10-CM | POA: Diagnosis not present

## 2018-08-12 DIAGNOSIS — Z79899 Other long term (current) drug therapy: Secondary | ICD-10-CM | POA: Diagnosis not present

## 2018-08-12 DIAGNOSIS — Z923 Personal history of irradiation: Secondary | ICD-10-CM | POA: Insufficient documentation

## 2018-08-12 DIAGNOSIS — C50212 Malignant neoplasm of upper-inner quadrant of left female breast: Secondary | ICD-10-CM

## 2018-08-12 DIAGNOSIS — Z452 Encounter for adjustment and management of vascular access device: Secondary | ICD-10-CM | POA: Insufficient documentation

## 2018-08-12 DIAGNOSIS — Z853 Personal history of malignant neoplasm of breast: Secondary | ICD-10-CM | POA: Diagnosis not present

## 2018-08-12 DIAGNOSIS — Z171 Estrogen receptor negative status [ER-]: Secondary | ICD-10-CM | POA: Insufficient documentation

## 2018-08-12 LAB — CBC WITH DIFFERENTIAL (CANCER CENTER ONLY)
Abs Immature Granulocytes: 0.01 10*3/uL (ref 0.00–0.07)
Basophils Absolute: 0 10*3/uL (ref 0.0–0.1)
Basophils Relative: 0 %
Eosinophils Absolute: 0 10*3/uL (ref 0.0–0.5)
Eosinophils Relative: 0 %
HCT: 34 % — ABNORMAL LOW (ref 36.0–46.0)
Hemoglobin: 10.5 g/dL — ABNORMAL LOW (ref 12.0–15.0)
Immature Granulocytes: 0 %
Lymphocytes Relative: 25 %
Lymphs Abs: 1 10*3/uL (ref 0.7–4.0)
MCH: 29.2 pg (ref 26.0–34.0)
MCHC: 30.9 g/dL (ref 30.0–36.0)
MCV: 94.4 fL (ref 80.0–100.0)
Monocytes Absolute: 0.4 10*3/uL (ref 0.1–1.0)
Monocytes Relative: 8 %
Neutro Abs: 2.8 10*3/uL (ref 1.7–7.7)
Neutrophils Relative %: 67 %
Platelet Count: 228 10*3/uL (ref 150–400)
RBC: 3.6 MIL/uL — ABNORMAL LOW (ref 3.87–5.11)
RDW: 14.2 % (ref 11.5–15.5)
WBC Count: 4.2 10*3/uL (ref 4.0–10.5)
nRBC: 0 % (ref 0.0–0.2)

## 2018-08-12 LAB — CMP (CANCER CENTER ONLY)
ALT: 19 U/L (ref 10–47)
AST: 26 U/L (ref 11–38)
Albumin: 3.3 g/dL — ABNORMAL LOW (ref 3.5–5.0)
Alkaline Phosphatase: 63 U/L (ref 26–84)
Anion gap: 2 — ABNORMAL LOW (ref 5–15)
BUN: 13 mg/dL (ref 7–22)
CO2: 31 mmol/L (ref 18–33)
Calcium: 9.6 mg/dL (ref 8.0–10.3)
Chloride: 105 mmol/L (ref 98–108)
Creatinine: 0.9 mg/dL (ref 0.60–1.20)
Glucose, Bld: 133 mg/dL — ABNORMAL HIGH (ref 73–118)
Potassium: 3.2 mmol/L — ABNORMAL LOW (ref 3.3–4.7)
Sodium: 138 mmol/L (ref 128–145)
Total Bilirubin: 0.5 mg/dL (ref 0.2–1.6)
Total Protein: 6.6 g/dL (ref 6.4–8.1)

## 2018-08-12 LAB — LACTATE DEHYDROGENASE: LDH: 143 U/L (ref 98–192)

## 2018-08-12 NOTE — Progress Notes (Signed)
Hematology and Oncology Follow Up Visit  Jessica [name] [id number] August 16, 1941 77 y.o. 08/12/2018   Principle Diagnosis:  Stage IIA (T2bN0M0) invasive ductal ca of the LEFT breast - TRIPLE NEGATIVE Staphylococcus aureus wound infection of the LEFT axilla Cellulitis of the left breast  Past Therapy:   Taxotere/Carboplatin - s/p cycle #4 -completed adjuvant therapy on 09/09/2017 Radiation therapy to the right breast.-Completed 4005 rad with an additional 1000 rad boost on 11/11/2017  Current Therapy: Observation   Interim History:  Jessica [name] is here today with her daughter for follow-up.  She is doing okay.  She is having a lot of difficulties with her husband.  He is sick.  He apparently has kidney cancer.  He is on dialysis.  He is going to have surgery in November.  She is worried about her cancer coming back.  I told her that I did not think that her cancer would come back.  She does have triple negative disease.  I told her that if her cancer came back, it likely would come back in about 5 years.  She still has her Port-A-Cath in place.  She is doing well with this.  We are flushing this today.  She is had no fever.  She is had no change in bowel or bladder habits.  She has had no leg swelling.  She has had no rashes.  There is been no headache.  Overall, her performance status is ECOG 1.  Medications:  Allergies as of 08/12/2018      Reactions   Codeine Hives   Penicillins Hives   Has patient had a PCN reaction causing immediate rash, facial/tongue/throat swelling, SOB or lightheadedness with hypotension: yes Has patient had a PCN reaction causing severe rash involving mucus membranes or skin necrosis:no Has patient had a PCN reaction that required hospitalization:no Has patient had a PCN reaction occurring within the last 10 years: no If all of the above answers are "NO", then may proceed with Cephalosporin use. Has patient had a PCN reaction causing immediate  rash, facial/tongue/throat swelling, SOB or lightheadedness with hypotension: yes Has patient had a PCN reaction causing severe rash involving mucus membranes or skin necrosis:  no Has patient had a PCN reaction that required hospitalization:  no Has patient had a PCN reaction occurring within the last 10 years: no If all of the above answers are "NO", then may proceed with Cephalosporin use.   Advil [ibuprofen] Hives, Itching   Band-aid Plus Antibiotic [bacitracin-polymyxin B] Dermatitis   Benazepril Swelling   Possible cause of tongue swelling   Other Rash   Band-aid      Medication List        Accurate as of 08/12/18 11:06 AM. Always use your most recent med list.          diphenhydramine-acetaminophen 25-500 MG Tabs tablet Commonly known as:  TYLENOL PM Take 1 tablet by mouth at bedtime as needed (sleep).   GENERLAC 10 GM/15ML Soln Generic drug:  lactulose (encephalopathy) TAKE 30 ML BY MOUTH EVERY 4 HOURS AS NEEDED FOR MILD CONSTIPATION UNTIL BOWEL MOVEMENT   hyaluronate sodium Gel Apply 1 application topically once.   hydrochlorothiazide 12.5 MG capsule Commonly known as:  MICROZIDE Take 12.5 mg by mouth daily.   levothyroxine 75 MCG tablet Commonly known as:  SYNTHROID, LEVOTHROID Take 75 mcg by mouth daily.   lidocaine-prilocaine cream Commonly known as:  EMLA Apply 1 application topically as needed.   LORazepam 0.5 MG tablet Commonly known as:  ATIVAN Take 1 tablet (0.5 mg total) by mouth every 6 (six) hours as needed (Nausea or vomiting).   Lysine 1000 MG Tabs Take 1,000 mg by mouth as needed.   multivitamin tablet Take 1 tablet by mouth daily.   omeprazole 20 MG capsule Commonly known as:  PRILOSEC   ondansetron 8 MG tablet Commonly known as:  ZOFRAN Take 1 tablet (8 mg total) by mouth 2 (two) times daily as needed for refractory nausea / vomiting. Start on day 3 after chemo.   OVER THE COUNTER MEDICATION Take 15 mLs by mouth 2 (two) times  daily as needed (pain).   polyvinyl alcohol 1.4 % ophthalmic solution Commonly known as:  LIQUIFILM TEARS 1 drop as needed for dry eyes.   potassium chloride SA 20 MEQ tablet Commonly known as:  K-DUR,KLOR-CON Take 2 tablets (40 mEq total) daily by mouth.   prochlorperazine 10 MG tablet Commonly known as:  COMPAZINE TAKE 1 TABLET(10 MG) BY MOUTH EVERY 6 HOURS AS NEEDED FOR NAUSEA OR VOMITING   venlafaxine XR 75 MG 24 hr capsule Commonly known as:  EFFEXOR-XR Take 225 mg by mouth daily.       Allergies:  Allergies  Allergen Reactions  . Codeine Hives  . Penicillins Hives    Has patient had a PCN reaction causing immediate rash, facial/tongue/throat swelling, SOB or lightheadedness with hypotension: yes Has patient had a PCN reaction causing severe rash involving mucus membranes or skin necrosis:no Has patient had a PCN reaction that required hospitalization:no Has patient had a PCN reaction occurring within the last 10 years: no If all of the above answers are "NO", then may proceed with Cephalosporin use. Has patient had a PCN reaction causing immediate rash, facial/tongue/throat swelling, SOB or lightheadedness with hypotension: yes Has patient had a PCN reaction causing severe rash involving mucus membranes or skin necrosis:  no Has patient had a PCN reaction that required hospitalization:  no Has patient had a PCN reaction occurring within the last 10 years: no If all of the above answers are "NO", then may proceed with Cephalosporin use.   . Advil [Ibuprofen] Hives and Itching  . Band-Aid Plus Antibiotic [Bacitracin-Polymyxin B] Dermatitis  . Benazepril Swelling    Possible cause of tongue swelling  . Other Rash    Band-aid    Past Medical History, Surgical history, Social history, and Family History were reviewed and updated.  Review of Systems: Review of Systems  Constitutional: Negative.   HENT: Negative.   Eyes: Negative.   Respiratory: Negative.     Cardiovascular: Negative.   Gastrointestinal: Negative.   Genitourinary: Negative.   Musculoskeletal: Negative.   Skin: Negative.   Neurological: Negative.   Endo/Heme/Allergies: Negative.   Psychiatric/Behavioral: Negative.      Physical Exam:  weight is 169 lb (76.7 kg). Her oral temperature is 98.5 F (36.9 C). Her blood pressure is 152/67 (abnormal) and her pulse is 73. Her respiration is 20 and oxygen saturation is 96%.   Wt Readings from Last 3 Encounters:  08/12/18 169 lb (76.7 kg)  05/12/18 166 lb (75.3 kg)  02/10/18 167 lb (75.8 kg)    Physical Exam  Constitutional: She is oriented to person, place, and time.  HENT:  Head: Normocephalic and atraumatic.  Mouth/Throat: Oropharynx is clear and moist.  Eyes: Pupils are equal, round, and reactive to light. EOM are normal.  Neck: Normal range of motion.  Cardiovascular: Normal rate, regular rhythm and normal heart sounds.  Pulmonary/Chest: Effort normal and breath  sounds normal.  Abdominal: Soft. Bowel sounds are normal.  Musculoskeletal: Normal range of motion. She exhibits no edema, tenderness or deformity.  Lymphadenopathy:    She has no cervical adenopathy.  Neurological: She is alert and oriented to person, place, and time.  Skin: Skin is warm and dry. No rash noted. No erythema.  Psychiatric: She has a normal mood and affect. Her behavior is normal. Judgment and thought content normal.  Vitals reviewed.    Lab Results  Component Value Date   WBC 4.2 08/12/2018   HGB 10.5 (L) 08/12/2018   HCT 34.0 (L) 08/12/2018   MCV 94.4 08/12/2018   PLT 228 08/12/2018   No results found for: FERRITIN, IRON, TIBC, UIBC, IRONPCTSAT Lab Results  Component Value Date   RBC 3.60 (L) 08/12/2018   No results found for: KPAFRELGTCHN, LAMBDASER, KAPLAMBRATIO No results found for: Kandis Cocking, IGMSERUM No results found for: Odetta Pink, SPEI   Chemistry       Component Value Date/Time   NA 138 08/12/2018 0926   NA 143 10/08/2017 1042   NA 141 06/16/2017 1014   K 3.2 (L) 08/12/2018 0926   K 3.4 10/08/2017 1042   K 3.8 06/16/2017 1014   CL 105 08/12/2018 0926   CL 100 10/08/2017 1042   CO2 31 08/12/2018 0926   CO2 30 10/08/2017 1042   CO2 31 (H) 06/16/2017 1014   BUN 13 08/12/2018 0926   BUN 10 10/08/2017 1042   BUN 8.9 06/16/2017 1014   CREATININE 0.90 08/12/2018 0926   CREATININE 0.9 10/08/2017 1042   CREATININE 0.8 06/16/2017 1014      Component Value Date/Time   CALCIUM 9.6 08/12/2018 0926   CALCIUM 9.5 10/08/2017 1042   CALCIUM 10.1 06/16/2017 1014   ALKPHOS 63 08/12/2018 0926   ALKPHOS 68 10/08/2017 1042   ALKPHOS 64 06/16/2017 1014   AST 26 08/12/2018 0926   AST 21 06/16/2017 1014   ALT 19 08/12/2018 0926   ALT 20 10/08/2017 1042   ALT 20 06/16/2017 1014   BILITOT 0.5 08/12/2018 0926   BILITOT 0.37 06/16/2017 1014      Impression and Plan: Ms. Froh is a very pleasant 77 yo caucasian female with stage IIA ductal carcinoma of the left breast.  Her breast cancer is TRIPLE NEGATIVE.    She had her partial mastectomy in May 2018 followed by chemotherapy and radiation.  She completed chemotherapy in November 2018.  She then had radiation and completed radiation in January 2019.  Right now, I do not see any evidence of recurrent disease.  Everything looks quite well.  I think we get her back in 4 more months.  I think this would be reasonable.   We will make sure that she has her Port-A-Cath flushed every 2 months.  [name], MD 10/11/201911:06 AM

## 2018-10-12 ENCOUNTER — Inpatient Hospital Stay: Payer: Medicare Other | Attending: Hematology & Oncology

## 2018-10-12 DIAGNOSIS — Z171 Estrogen receptor negative status [ER-]: Secondary | ICD-10-CM | POA: Diagnosis not present

## 2018-10-12 DIAGNOSIS — Z9221 Personal history of antineoplastic chemotherapy: Secondary | ICD-10-CM | POA: Insufficient documentation

## 2018-10-12 DIAGNOSIS — Z923 Personal history of irradiation: Secondary | ICD-10-CM | POA: Insufficient documentation

## 2018-10-12 DIAGNOSIS — Z452 Encounter for adjustment and management of vascular access device: Secondary | ICD-10-CM | POA: Insufficient documentation

## 2018-10-12 DIAGNOSIS — Z853 Personal history of malignant neoplasm of breast: Secondary | ICD-10-CM | POA: Insufficient documentation

## 2018-10-12 NOTE — Patient Instructions (Signed)
Implanted Port Home Guide An implanted port is a type of central line that is placed under the skin. Central lines are used to provide IV access when treatment or nutrition needs to be given through a person's veins. Implanted ports are used for long-term IV access. An implanted port may be placed because:  You need IV medicine that would be irritating to the small veins in your hands or arms.  You need long-term IV medicines, such as antibiotics.  You need IV nutrition for a long period.  You need frequent blood draws for lab tests.  You need dialysis.  Implanted ports are usually placed in the chest area, but they can also be placed in the upper arm, the abdomen, or the leg. An implanted port has two main parts:  Reservoir. The reservoir is round and will appear as a small, raised area under your skin. The reservoir is the part where a needle is inserted to give medicines or draw blood.  Catheter. The catheter is a thin, flexible tube that extends from the reservoir. The catheter is placed into a large vein. Medicine that is inserted into the reservoir goes into the catheter and then into the vein.  How will I care for my incision site? Do not get the incision site wet. Bathe or shower as directed by your health care provider. How is my port accessed? Special steps must be taken to access the port:  Before the port is accessed, a numbing cream can be placed on the skin. This helps numb the skin over the port site.  Your health care provider uses a sterile technique to access the port. ? Your health care provider must put on a mask and sterile gloves. ? The skin over your port is cleaned carefully with an antiseptic and allowed to dry. ? The port is gently pinched between sterile gloves, and a needle is inserted into the port.  Only "non-coring" port needles should be used to access the port. Once the port is accessed, a blood return should be checked. This helps ensure that the port  is in the vein and is not clogged.  If your port needs to remain accessed for a constant infusion, a clear (transparent) bandage will be placed over the needle site. The bandage and needle will need to be changed every week, or as directed by your health care provider.  Keep the bandage covering the needle clean and dry. Do not get it wet. Follow your health care provider's instructions on how to take a shower or bath while the port is accessed.  If your port does not need to stay accessed, no bandage is needed over the port.  What is flushing? Flushing helps keep the port from getting clogged. Follow your health care provider's instructions on how and when to flush the port. Ports are usually flushed with saline solution or a medicine called heparin. The need for flushing will depend on how the port is used.  If the port is used for intermittent medicines or blood draws, the port will need to be flushed: ? After medicines have been given. ? After blood has been drawn. ? As part of routine maintenance.  If a constant infusion is running, the port may not need to be flushed.  How long will my port stay implanted? The port can stay in for as long as your health care provider thinks it is needed. When it is time for the port to come out, surgery will be   done to remove it. The procedure is similar to the one performed when the port was put in. When should I seek immediate medical care? When you have an implanted port, you should seek immediate medical care if:  You notice a bad smell coming from the incision site.  You have swelling, redness, or drainage at the incision site.  You have more swelling or pain at the port site or the surrounding area.  You have a fever that is not controlled with medicine.  This information is not intended to replace advice given to you by your health care provider. Make sure you discuss any questions you have with your health care provider. Document  Released: 10/19/2005 Document Revised: 03/26/2016 Document Reviewed: 06/26/2013 Elsevier Interactive Patient Education  2017 Elsevier Inc[employer]  

## 2018-11-18 ENCOUNTER — Inpatient Hospital Stay: Payer: Medicare Other | Attending: Hematology & Oncology

## 2018-11-18 VITALS — BP 151/59 | HR 79 | Resp 18

## 2018-11-18 DIAGNOSIS — Z923 Personal history of irradiation: Secondary | ICD-10-CM | POA: Diagnosis not present

## 2018-11-18 DIAGNOSIS — Z171 Estrogen receptor negative status [ER-]: Secondary | ICD-10-CM | POA: Insufficient documentation

## 2018-11-18 DIAGNOSIS — Z853 Personal history of malignant neoplasm of breast: Secondary | ICD-10-CM | POA: Diagnosis not present

## 2018-11-18 DIAGNOSIS — Z452 Encounter for adjustment and management of vascular access device: Secondary | ICD-10-CM | POA: Diagnosis not present

## 2018-11-18 DIAGNOSIS — Z95828 Presence of other vascular implants and grafts: Secondary | ICD-10-CM

## 2018-11-18 DIAGNOSIS — Z9221 Personal history of antineoplastic chemotherapy: Secondary | ICD-10-CM | POA: Insufficient documentation

## 2018-11-18 MED ORDER — SODIUM CHLORIDE 0.9% FLUSH
10.0000 mL | INTRAVENOUS | Status: DC | PRN
  Administered 2018-11-18: 10 mL via INTRAVENOUS
  Filled 2018-11-18: qty 10

## 2018-11-18 MED ORDER — HEPARIN SOD (PORK) LOCK FLUSH 100 UNIT/ML IV SOLN
500.0000 [IU] | Freq: Once | INTRAVENOUS | Status: AC
  Administered 2018-11-18: 500 [IU] via INTRAVENOUS
  Filled 2018-11-18: qty 5

## 2018-12-14 ENCOUNTER — Inpatient Hospital Stay: Payer: Medicare Other | Attending: Hematology & Oncology | Admitting: Hematology & Oncology

## 2018-12-14 ENCOUNTER — Inpatient Hospital Stay: Payer: Medicare Other

## 2018-12-14 ENCOUNTER — Telehealth: Payer: Self-pay | Admitting: Hematology & Oncology

## 2018-12-14 VITALS — BP 171/78 | HR 78 | Temp 98.7°F | Resp 18 | Wt 170.2 lb

## 2018-12-14 DIAGNOSIS — Z923 Personal history of irradiation: Secondary | ICD-10-CM

## 2018-12-14 DIAGNOSIS — Z95828 Presence of other vascular implants and grafts: Secondary | ICD-10-CM

## 2018-12-14 DIAGNOSIS — E611 Iron deficiency: Secondary | ICD-10-CM | POA: Insufficient documentation

## 2018-12-14 DIAGNOSIS — Z171 Estrogen receptor negative status [ER-]: Secondary | ICD-10-CM | POA: Insufficient documentation

## 2018-12-14 DIAGNOSIS — Z79899 Other long term (current) drug therapy: Secondary | ICD-10-CM | POA: Insufficient documentation

## 2018-12-14 DIAGNOSIS — Z452 Encounter for adjustment and management of vascular access device: Secondary | ICD-10-CM | POA: Diagnosis not present

## 2018-12-14 DIAGNOSIS — C50212 Malignant neoplasm of upper-inner quadrant of left female breast: Secondary | ICD-10-CM

## 2018-12-14 DIAGNOSIS — Z9221 Personal history of antineoplastic chemotherapy: Secondary | ICD-10-CM | POA: Diagnosis not present

## 2018-12-14 DIAGNOSIS — D508 Other iron deficiency anemias: Secondary | ICD-10-CM

## 2018-12-14 DIAGNOSIS — Z86000 Personal history of in-situ neoplasm of breast: Secondary | ICD-10-CM | POA: Diagnosis not present

## 2018-12-14 LAB — CBC WITH DIFFERENTIAL (CANCER CENTER ONLY)
Abs Immature Granulocytes: 0.01 10*3/uL (ref 0.00–0.07)
Basophils Absolute: 0 10*3/uL (ref 0.0–0.1)
Basophils Relative: 0 %
Eosinophils Absolute: 0 10*3/uL (ref 0.0–0.5)
Eosinophils Relative: 0 %
HCT: 37.1 % (ref 36.0–46.0)
Hemoglobin: 11.9 g/dL — ABNORMAL LOW (ref 12.0–15.0)
Immature Granulocytes: 0 %
Lymphocytes Relative: 28 %
Lymphs Abs: 1.3 10*3/uL (ref 0.7–4.0)
MCH: 31.1 pg (ref 26.0–34.0)
MCHC: 32.1 g/dL (ref 30.0–36.0)
MCV: 96.9 fL (ref 80.0–100.0)
Monocytes Absolute: 0.4 10*3/uL (ref 0.1–1.0)
Monocytes Relative: 9 %
Neutro Abs: 2.9 10*3/uL (ref 1.7–7.7)
Neutrophils Relative %: 63 %
Platelet Count: 223 10*3/uL (ref 150–400)
RBC: 3.83 MIL/uL — ABNORMAL LOW (ref 3.87–5.11)
RDW: 14.9 % (ref 11.5–15.5)
WBC Count: 4.7 10*3/uL (ref 4.0–10.5)
nRBC: 0 % (ref 0.0–0.2)

## 2018-12-14 LAB — CMP (CANCER CENTER ONLY)
ALT: 10 U/L (ref 0–44)
AST: 14 U/L — ABNORMAL LOW (ref 15–41)
Albumin: 4.2 g/dL (ref 3.5–5.0)
Alkaline Phosphatase: 49 U/L (ref 38–126)
Anion gap: 8 (ref 5–15)
BUN: 15 mg/dL (ref 8–23)
CO2: 32 mmol/L (ref 22–32)
Calcium: 9.9 mg/dL (ref 8.9–10.3)
Chloride: 99 mmol/L (ref 98–111)
Creatinine: 0.72 mg/dL (ref 0.44–1.00)
GFR, Est AFR Am: >60 mL/min (ref >60)
GFR, Estimated: >60 mL/min (ref >60)
Glucose, Bld: 101 mg/dL — ABNORMAL HIGH (ref 70–99)
Potassium: 3.6 mmol/L (ref 3.5–5.1)
Sodium: 139 mmol/L (ref 135–145)
Total Bilirubin: 0.3 mg/dL (ref 0.3–1.2)
Total Protein: 6.6 g/dL (ref 6.5–8.1)

## 2018-12-14 LAB — IRON AND TIBC
Iron: 141 ug/dL (ref 41–142)
Saturation Ratios: 35 % (ref 21–57)
TIBC: 396 ug/dL (ref 236–444)
UIBC: 256 ug/dL (ref 120–384)

## 2018-12-14 LAB — FERRITIN: Ferritin: 6 ng/mL — ABNORMAL LOW (ref 11–307)

## 2018-12-14 MED ORDER — SODIUM CHLORIDE 0.9% FLUSH
10.0000 mL | INTRAVENOUS | Status: DC | PRN
  Administered 2018-12-14: 10 mL via INTRAVENOUS
  Filled 2018-12-14: qty 10

## 2018-12-14 MED ORDER — HEPARIN SOD (PORK) LOCK FLUSH 100 UNIT/ML IV SOLN
500.0000 [IU] | Freq: Once | INTRAVENOUS | Status: AC
  Administered 2018-12-14: 500 [IU] via INTRAVENOUS
  Filled 2018-12-14: qty 5

## 2018-12-14 NOTE — Telephone Encounter (Signed)
Appointments scheduled AVS/Calendar printed per 2/12 los

## 2018-12-14 NOTE — Progress Notes (Signed)
Hematology and Oncology Follow Up Visit  Jessica [name] [id number] 1941/05/07 78 y.o. 12/14/2018   Principle Diagnosis:  Stage IIA ([id number]) invasive ductal ca of the LEFT breast - TRIPLE NEGATIVE Staphylococcus aureus wound infection of the LEFT axilla Cellulitis of the left breast  Past Therapy:   Taxotere/Carboplatin - s/p cycle #4 -completed adjuvant therapy on 09/09/2017 Radiation therapy to the right breast.-Completed 4005 rad with an additional 1000 rad boost on 11/11/2017  Current Therapy: Observation   Interim History:  Jessica [name] is here today with her daughter for follow-up.unfortunately, her husband passed away back in Dec 24, 2022.  It was a sudden passing although he was sick for quite a while.  I know this is been very difficult for Jessica [name] but she does realize that he is much better off now.  She is some of her friends were at the beach this past weekend.  She had a wonderful time down there.  They even had breakfast at the infamous [employer].  Without doubt, that is the spot to eat for breakfast.  She is a bit nervous about being here today.  For some reason, she seems to think that her cancer is coming back.  She has had no cough.  There is been no nausea or vomiting.  She has had no change in bowel or bladder habits.  She has had no leg swelling.  There is been no rashes.  She still has her Port-A-Cath in.  She had this flushed today.  Overall, her performance status is ECOG 1.   Medications:  Allergies as of 12/14/2018      Reactions   Codeine Hives   Penicillins Hives   Has patient had a PCN reaction causing immediate rash, facial/tongue/throat swelling, SOB or lightheadedness with hypotension: yes Has patient had a PCN reaction causing severe rash involving mucus membranes or skin necrosis:no Has patient had a PCN reaction that required hospitalization:no Has patient had a PCN reaction occurring within the last 10 years: no If all of the  above answers are "NO", then may proceed with Cephalosporin use. Has patient had a PCN reaction causing immediate rash, facial/tongue/throat swelling, SOB or lightheadedness with hypotension: yes Has patient had a PCN reaction causing severe rash involving mucus membranes or skin necrosis:  no Has patient had a PCN reaction that required hospitalization:  no Has patient had a PCN reaction occurring within the last 10 years: no If all of the above answers are "NO", then may proceed with Cephalosporin use.   Advil [ibuprofen] Hives, Itching   Band-aid Plus Antibiotic [bacitracin-polymyxin B] Dermatitis   Benazepril Swelling   Possible cause of tongue swelling   Other Rash   Band-aid      Medication List       Accurate as of December 14, 2018 10:10 AM. Always use your most recent med list.        diphenhydramine-acetaminophen 25-500 MG Tabs tablet Commonly known as:  TYLENOL PM Take 1 tablet by mouth at bedtime as needed (sleep).   GENERLAC 10 GM/15ML Soln Generic drug:  lactulose (encephalopathy) TAKE 30 ML BY MOUTH EVERY 4 HOURS AS NEEDED FOR MILD CONSTIPATION UNTIL BOWEL MOVEMENT   hyaluronate sodium Gel Apply 1 application topically once.   hydrochlorothiazide 12.5 MG capsule Commonly known as:  MICROZIDE Take 12.5 mg by mouth daily.   levothyroxine 75 MCG tablet Commonly known as:  SYNTHROID, LEVOTHROID Take 75 mcg by mouth daily.   lidocaine-prilocaine cream Commonly known as:  EMLA  Apply 1 application topically as needed.   LORazepam 0.5 MG tablet Commonly known as:  ATIVAN Take 1 tablet (0.5 mg total) by mouth every 6 (six) hours as needed (Nausea or vomiting).   Lysine 1000 MG Tabs Take 1,000 mg by mouth as needed.   multivitamin tablet Take 1 tablet by mouth daily.   omeprazole 20 MG capsule Commonly known as:  PRILOSEC   ondansetron 8 MG tablet Commonly known as:  ZOFRAN Take 1 tablet (8 mg total) by mouth 2 (two) times daily as needed for refractory  nausea / vomiting. Start on day 3 after chemo.   OVER THE COUNTER MEDICATION Take 15 mLs by mouth 2 (two) times daily as needed (pain).   polyvinyl alcohol 1.4 % ophthalmic solution Commonly known as:  LIQUIFILM TEARS 1 drop as needed for dry eyes.   potassium chloride SA 20 MEQ tablet Commonly known as:  K-DUR,KLOR-CON Take 2 tablets (40 mEq total) daily by mouth.   prochlorperazine 10 MG tablet Commonly known as:  COMPAZINE TAKE 1 TABLET(10 MG) BY MOUTH EVERY 6 HOURS AS NEEDED FOR NAUSEA OR VOMITING   venlafaxine XR 75 MG 24 hr capsule Commonly known as:  EFFEXOR-XR Take 225 mg by mouth daily.       Allergies:  Allergies  Allergen Reactions  . Codeine Hives  . Penicillins Hives    Has patient had a PCN reaction causing immediate rash, facial/tongue/throat swelling, SOB or lightheadedness with hypotension: yes Has patient had a PCN reaction causing severe rash involving mucus membranes or skin necrosis:no Has patient had a PCN reaction that required hospitalization:no Has patient had a PCN reaction occurring within the last 10 years: no If all of the above answers are "NO", then may proceed with Cephalosporin use. Has patient had a PCN reaction causing immediate rash, facial/tongue/throat swelling, SOB or lightheadedness with hypotension: yes Has patient had a PCN reaction causing severe rash involving mucus membranes or skin necrosis:  no Has patient had a PCN reaction that required hospitalization:  no Has patient had a PCN reaction occurring within the last 10 years: no If all of the above answers are "NO", then may proceed with Cephalosporin use.   . Advil [Ibuprofen] Hives and Itching  . Band-Aid Plus Antibiotic [Bacitracin-Polymyxin B] Dermatitis  . Benazepril Swelling    Possible cause of tongue swelling  . Other Rash    Band-aid    Past Medical History, Surgical history, Social history, and Family History were reviewed and updated.  Review of  Systems: Review of Systems  Constitutional: Negative.   HENT: Negative.   Eyes: Negative.   Respiratory: Negative.   Cardiovascular: Negative.   Gastrointestinal: Negative.   Genitourinary: Negative.   Musculoskeletal: Negative.   Skin: Negative.   Neurological: Negative.   Endo/Heme/Allergies: Negative.   Psychiatric/Behavioral: Negative.      Physical Exam:  weight is 170 lb 4 oz (77.2 kg). Her oral temperature is 98.7 F (37.1 C). Her blood pressure is 171/78 (abnormal) and her pulse is 78. Her respiration is 18 and oxygen saturation is 97%.   Wt Readings from Last 3 Encounters:  12/14/18 170 lb 4 oz (77.2 kg)  08/12/18 169 lb (76.7 kg)  05/12/18 166 lb (75.3 kg)    Physical Exam Vitals signs reviewed.  HENT:     Head: Normocephalic and atraumatic.  Eyes:     Pupils: Pupils are equal, round, and reactive to light.  Neck:     Musculoskeletal: Normal range of motion.  Cardiovascular:     Rate and Rhythm: Normal rate and regular rhythm.     Heart sounds: Normal heart sounds.  Pulmonary:     Effort: Pulmonary effort is normal.     Breath sounds: Normal breath sounds.  Abdominal:     General: Bowel sounds are normal.     Palpations: Abdomen is soft.  Musculoskeletal: Normal range of motion.        General: No tenderness or deformity.  Lymphadenopathy:     Cervical: No cervical adenopathy.  Skin:    General: Skin is warm and dry.     Findings: No erythema or rash.  Neurological:     Mental Status: She is alert and oriented to person, place, and time.  Psychiatric:        Behavior: Behavior normal.        Thought Content: Thought content normal.        Judgment: Judgment normal.      Lab Results  Component Value Date   WBC 4.7 12/14/2018   HGB 11.9 (L) 12/14/2018   HCT 37.1 12/14/2018   MCV 96.9 12/14/2018   PLT 223 12/14/2018   No results found for: FERRITIN, IRON, TIBC, UIBC, IRONPCTSAT Lab Results  Component Value Date   RBC 3.83 (L) 12/14/2018    No results found for: KPAFRELGTCHN, LAMBDASER, KAPLAMBRATIO No results found for: Kandis Cocking, IGMSERUM No results found for: Odetta Pink, SPEI   Chemistry      Component Value Date/Time   NA 139 12/14/2018 0905   NA 143 10/08/2017 1042   NA 141 06/16/2017 1014   K 3.6 12/14/2018 0905   K 3.4 10/08/2017 1042   K 3.8 06/16/2017 1014   CL 99 12/14/2018 0905   CL 100 10/08/2017 1042   CO2 32 12/14/2018 0905   CO2 30 10/08/2017 1042   CO2 31 (H) 06/16/2017 1014   BUN 15 12/14/2018 0905   BUN 10 10/08/2017 1042   BUN 8.9 06/16/2017 1014   CREATININE 0.72 12/14/2018 0905   CREATININE 0.9 10/08/2017 1042   CREATININE 0.8 06/16/2017 1014      Component Value Date/Time   CALCIUM 9.9 12/14/2018 0905   CALCIUM 9.5 10/08/2017 1042   CALCIUM 10.1 06/16/2017 1014   ALKPHOS 49 12/14/2018 0905   ALKPHOS 68 10/08/2017 1042   ALKPHOS 64 06/16/2017 1014   AST 14 (L) 12/14/2018 0905   AST 21 06/16/2017 1014   ALT 10 12/14/2018 0905   ALT 20 10/08/2017 1042   ALT 20 06/16/2017 1014   BILITOT 0.3 12/14/2018 0905   BILITOT 0.37 06/16/2017 1014      Impression and Plan: Ms. Strong is a very pleasant 78 yo caucasian female with stage IIA ductal carcinoma of the left breast.  Her breast cancer is TRIPLE NEGATIVE.    She had her partial mastectomy in May 2018 followed by chemotherapy and radiation.  She completed chemotherapy in November 2018.  She then had radiation and completed radiation in January 2019.  Right now, I do not see any evidence of recurrent disease.  Everything looks quite well.  I think we get her back in 4 more months.  I think this would be reasonable.   We will make sure that she has her Port-A-Cath flushed every 2 months.  [name], MD 2/12/202010:10 AM

## 2018-12-15 DIAGNOSIS — I10 Essential (primary) hypertension: Secondary | ICD-10-CM | POA: Diagnosis not present

## 2018-12-15 LAB — VITAMIN D 25 HYDROXY (VIT D DEFICIENCY, FRACTURES): Vit D, 25-Hydroxy: 50.9 ng/mL (ref 30.0–100.0)

## 2018-12-20 DIAGNOSIS — I10 Essential (primary) hypertension: Secondary | ICD-10-CM | POA: Diagnosis not present

## 2018-12-20 DIAGNOSIS — E559 Vitamin D deficiency, unspecified: Secondary | ICD-10-CM | POA: Diagnosis not present

## 2018-12-20 DIAGNOSIS — E7800 Pure hypercholesterolemia, unspecified: Secondary | ICD-10-CM | POA: Diagnosis not present

## 2018-12-27 DIAGNOSIS — E039 Hypothyroidism, unspecified: Secondary | ICD-10-CM | POA: Diagnosis not present

## 2018-12-27 DIAGNOSIS — C50212 Malignant neoplasm of upper-inner quadrant of left female breast: Secondary | ICD-10-CM | POA: Diagnosis not present

## 2018-12-27 DIAGNOSIS — Z86010 Personal history of colonic polyps: Secondary | ICD-10-CM | POA: Diagnosis not present

## 2018-12-27 DIAGNOSIS — Z0000 Encounter for general adult medical examination without abnormal findings: Secondary | ICD-10-CM | POA: Diagnosis not present

## 2018-12-27 DIAGNOSIS — Z1212 Encounter for screening for malignant neoplasm of rectum: Secondary | ICD-10-CM | POA: Diagnosis not present

## 2018-12-27 DIAGNOSIS — K219 Gastro-esophageal reflux disease without esophagitis: Secondary | ICD-10-CM | POA: Diagnosis not present

## 2019-01-03 ENCOUNTER — Other Ambulatory Visit: Payer: Self-pay | Admitting: Hematology & Oncology

## 2019-01-03 DIAGNOSIS — Z853 Personal history of malignant neoplasm of breast: Secondary | ICD-10-CM

## 2019-01-12 DIAGNOSIS — H04123 Dry eye syndrome of bilateral lacrimal glands: Secondary | ICD-10-CM | POA: Diagnosis not present

## 2019-01-12 DIAGNOSIS — H35361 Drusen (degenerative) of macula, right eye: Secondary | ICD-10-CM | POA: Diagnosis not present

## 2019-01-12 DIAGNOSIS — H26492 Other secondary cataract, left eye: Secondary | ICD-10-CM | POA: Diagnosis not present

## 2019-01-12 DIAGNOSIS — H35033 Hypertensive retinopathy, bilateral: Secondary | ICD-10-CM | POA: Diagnosis not present

## 2019-01-19 ENCOUNTER — Other Ambulatory Visit: Payer: Self-pay

## 2019-01-19 ENCOUNTER — Encounter: Payer: Self-pay | Admitting: Internal Medicine

## 2019-01-19 ENCOUNTER — Ambulatory Visit (INDEPENDENT_AMBULATORY_CARE_PROVIDER_SITE_OTHER): Payer: Medicare Other | Admitting: Internal Medicine

## 2019-01-19 VITALS — BP 112/80 | HR 77 | Ht 63.75 in | Wt 171.6 lb

## 2019-01-19 DIAGNOSIS — G4733 Obstructive sleep apnea (adult) (pediatric): Secondary | ICD-10-CM

## 2019-01-19 DIAGNOSIS — C50212 Malignant neoplasm of upper-inner quadrant of left female breast: Secondary | ICD-10-CM | POA: Diagnosis not present

## 2019-01-19 DIAGNOSIS — R0683 Snoring: Secondary | ICD-10-CM

## 2019-01-19 NOTE — Patient Instructions (Signed)
Order- please schedule unattended home sleep test     Dx OSA This procedure will have to wait until the virus situation is clearer and we are given permission to start doing elective procedures again.  Please call us about 2 weeks after your sleep test, for results and recommendations. If appropriate, we might be able to start treatment before we see you again.

## 2019-01-19 NOTE — Progress Notes (Signed)
01/19/2019- 78 yo F for sleep consult. referred by PCP (Dr. Shelia Media) for snoring Medical hx L breast CA/ partial mastectomy/XRT/chemo, HBP, GERD, Hypothyroid, Obesity Body weight 171 lbs Epworth score 11 She has been told by family and friends that she snores, witnessed apneas.  Sleeps soundly-mouth breather.  Denies history of ENT surgery except tonsils.  Remote history asthma no longer bothers her.  Admits some daytime sleepiness.  She is retired and widowed-able to nap or doze off without impacting quality of life.  Denies problems driving. Very difficult past year with treatment for left breast cancer and loss of husband.  She now feels able to cope with this problem.  Does have family support.  Prior to Admission medications   Medication Sig Start Date End Date Taking? Authorizing Provider  diphenhydramine-acetaminophen (TYLENOL PM) 25-500 MG TABS Take 1 tablet by mouth at bedtime as needed (sleep).    Yes [provider]  hyaluronate sodium (RADIAPLEXRX) GEL Apply 1 application topically once.   Yes [provider]  hydrochlorothiazide (MICROZIDE) 12.5 MG capsule Take 12.5 mg by mouth daily. 02/28/16  Yes [provider]  levothyroxine (SYNTHROID, LEVOTHROID) 75 MCG tablet Take 75 mcg by mouth daily. 07/19/13  Yes [provider]  lidocaine-prilocaine (EMLA) cream Apply 1 application topically as needed. 04/12/17  Yes Ennever, Rudell Cobb, MD  LORazepam (ATIVAN) 0.5 MG tablet Take 1 tablet (0.5 mg total) by mouth every 6 (six) hours as needed (Nausea or vomiting). 04/14/17  Yes Ennever, Rudell Cobb, MD  Lysine 1000 MG TABS Take 1,000 mg by mouth as needed.   Yes [provider]  Multiple Vitamin (MULTIVITAMIN) tablet Take 1 tablet by mouth daily.   Yes [provider]  omeprazole (PRILOSEC) 20 MG capsule  06/24/17  Yes [provider]  ondansetron (ZOFRAN) 8 MG tablet Take 1 tablet (8 mg total) by mouth 2 (two) times daily as needed for  refractory nausea / vomiting. Start on day 3 after chemo. 04/14/17  Yes Ennever, Rudell Cobb, MD  OVER THE COUNTER MEDICATION Take 15 mLs by mouth 2 (two) times daily as needed (pain).   Yes [provider]  polyvinyl alcohol (LIQUIFILM TEARS) 1.4 % ophthalmic solution 1 drop as needed for dry eyes.   Yes [provider]  potassium chloride SA (K-DUR,KLOR-CON) 20 MEQ tablet Take 2 tablets (40 mEq total) daily by mouth. 09/09/17  Yes [name], MD  venlafaxine XR (EFFEXOR-XR) 75 MG 24 hr capsule Take 225 mg by mouth daily.   Yes [provider]  GENERLAC 10 GM/15ML SOLN TAKE 30 ML BY MOUTH EVERY 4 HOURS AS NEEDED FOR MILD CONSTIPATION UNTIL BOWEL MOVEMENT Patient not taking: Reported on 01/19/2019 09/13/17   [name], MD  prochlorperazine (COMPAZINE) 10 MG tablet TAKE 1 TABLET(10 MG) BY MOUTH EVERY 6 HOURS AS NEEDED FOR NAUSEA OR VOMITING Patient not taking: Reported on 01/19/2019 04/14/17   [name], MD   Past Medical History:  Diagnosis Date  . Allergy   . Anemia   . Anxiety   . Asthma    in past, no inhalers now  . Blood transfusion without reported diagnosis   . Breast cancer ([place]) 01/2017   left breast   . Breast cancer of upper-inner quadrant of left female breast ([place]) 03/11/2017  . Cancer ([place]) 01/2017   left breast  . Cataract    bilateral removed  . Chronic kidney disease    kidney stones  . Depression 08/31/2013  .  Dyslipidemia, goal LDL below 160 08/31/2013  . Essential hypertension - well controlled 08/31/2013  . Family history of breast cancer   . Family history of melanoma   . Family history of prostate cancer   . GERD (gastroesophageal reflux disease)   . Goals of care, counseling/discussion 03/11/2017  . History of radiation therapy 10/13/17-11/11/17   left breast 40.05 Gy in 15 fractions, left breast boost 10 Gy in 5 fractions  . Hypothyroidism   . Obesity (BMI 30-39.9) 08/31/2013  . Personal history of chemotherapy 2018   . Personal history of radiation therapy 2019  . Thyroid disease   . Vasovagal near-syncope -rare 08/31/2013   Past Surgical History:  Procedure Laterality Date  . ABDOMINAL HYSTERECTOMY     total  . BREAST BIOPSY Left 02/24/2017    malignant  . BREAST LUMPECTOMY Left 03/23/2017  . broken fingers    . CATARACT EXTRACTION Bilateral   . COLONOSCOPY    . EXCISIONAL HEMORRHOIDECTOMY    . IR FLUORO GUIDE PORT INSERTION RIGHT  08/04/2017  . IR REMOVAL TUN ACCESS W/ PORT W/O FL MOD SED  08/04/2017  . IR US GUIDE VASC ACCESS RIGHT  08/04/2017  . IRRIGATION AND DEBRIDEMENT ABSCESS Left 05/18/2017   Procedure: IRRIGATION AND DEBRIDEMENT LEFT AXILLARY ABSCESS;  Surgeon: Michael Boston, MD;  Location: [facility];  Service: General;  Laterality: Left;  . kidney stones lithotripsy    . PORTACATH PLACEMENT Right 03/23/2017   Procedure: INSERTION PORT-A-CATH;  Surgeon: Erroll Luna, MD;  Location: [place];  Service: General;  Laterality: Right;  . RADIOACTIVE SEED GUIDED PARTIAL MASTECTOMY WITH AXILLARY SENTINEL LYMPH NODE BIOPSY Left 03/23/2017   Procedure: LEFT BREAST RADIOACTIVE SEED GUIDED PARTIAL MASTECTOMY AND  SENTINEL LYMPH NODE MAPPING;  Surgeon: Erroll Luna, MD;  Location: [place] [facility];  Service: General;  Laterality: Left;  . ROTATOR CUFF REPAIR     left   . TONSILLECTOMY    . WISDOM TOOTH EXTRACTION     Family History  Problem Relation Age of Onset  . Heart disease Mother        Angina  . Heart disease Father   . Cervical cancer Maternal Aunt   . Heart attack Brother        Died of MI in his mid 6s  . Prostate cancer Brother        dx in his early 63s  . Breast cancer Paternal Aunt        dx over 47  . Bone cancer Cousin        maternal first cousin dx in her 65s-60s  . Lung cancer Cousin        paternal first cousin  . Colon cancer Neg Hx   . Pancreatic cancer Neg Hx   . Stomach cancer Neg Hx   . Esophageal cancer Neg Hx   . Rectal cancer  Neg Hx    Social History   Socioeconomic History  . Marital status: Married    Spouse name: Not on file  . Number of children: 3  . Years of education: Not on file  . Highest education level: Not on file  Occupational History  . Occupation: retired   Scientific laboratory technician  . Financial resource strain: Not on file  . Food insecurity:    Worry: Not on file    Inability: Not on file  . Transportation needs:    Medical: Not on file    Non-medical: Not on file  Tobacco Use  .  Smoking status: Never Smoker  . Smokeless tobacco: Never Used  Substance and Sexual Activity  . Alcohol use: No  . Drug use: No  . Sexual activity: Not on file  Lifestyle  . Physical activity:    Days per week: Not on file    Minutes per session: Not on file  . Stress: Not on file  Relationships  . Social connections:    Talks on phone: Not on file    Gets together: Not on file    Attends religious service: Not on file    Active member of club or organization: Not on file    Attends meetings of clubs or organizations: Not on file    Relationship status: Not on file  . Intimate partner violence:    Fear of current or ex partner: Not on file    Emotionally abused: Not on file    Physically abused: Not on file    Forced sexual activity: Not on file  Other Topics Concern  . Not on file  Social History Narrative   Married mother of 3 with one grandchild. Never smoked. Does not drink alcohol.   Walks 3-4 days a week for roughly 20-30 minutes.   ROS-see HPI   + = positive Constitutional:    weight loss, night sweats, fevers, chills, fatigue, lassitude. HEENT:    headaches, difficulty swallowing, tooth/dental problems, sore throat,       sneezing, itching, ear ache, nasal congestion, post nasal drip, snoring CV:    chest pain, orthopnea, PND, swelling in lower extremities, anasarca,                                  dizziness, palpitations Resp:   shortness of breath with exertion or at rest.                 productive cough,   non-productive cough, coughing up of blood.              change in color of mucus.  wheezing.   Skin:    rash or lesions. GI:  No-   heartburn, indigestion, abdominal pain, nausea, vomiting, diarrhea,                 change in bowel habits, loss of appetite GU: dysuria, change in color of urine, no urgency or frequency.   flank pain. MS:   joint pain, stiffness, decreased range of motion, back pain. Neuro-     nothing unusual Psych:  change in mood or affect.  depression or anxiety.   memory loss.  OBJ- Physical Exam General- Alert, Oriented, Affect-appropriate, Distress- none acute, + overweight Skin- rash-none, lesions- none, excoriation- none Lymphadenopathy- none Head- atraumatic            Eyes- Gross vision intact, PERRLA, conjunctivae and secretions clear            Ears- Hearing, canals-normal            Nose- Clear, no-Septal dev, mucus, polyps, erosion, perforation             Throat- Mallampati II-III , mucosa clear , drainage- none, tonsils- atrophic, own teeth Neck- flexible , trachea midline, no stridor , thyroid nl, carotid no bruit Chest - symmetrical excursion , unlabored           Heart/CV- RRR , no murmur , no gallop  , no rub, nl s1 s2                           -  JVD- none , edema- none, stasis changes- none, varices- none           Lung- clear to P&A, wheeze- none, cough- none , dullness-none, rub- none           Chest wall- + port R upper  Abd-  Br/ Gen/ Rectal- Not done, not indicated Extrem- cyanosis- none, clubbing, none, atrophy- none, strength- nl Neuro- grossly intact to observation

## 2019-01-20 DIAGNOSIS — R0683 Snoring: Secondary | ICD-10-CM | POA: Insufficient documentation

## 2019-01-20 NOTE — Assessment & Plan Note (Signed)
Managed by oncology.  Hopefully this is resolved.

## 2019-01-20 NOTE — Assessment & Plan Note (Addendum)
History and physical exam make some degree of obstructive sleep apnea likely.  He has been through a lot of stress in the last year so I am pleased that she reports sound sleep.  We discussed the basics of OSA., sleep off back, weight, safe driving. Plan-we will schedule sleep study, preferably at home if allowed by insurance.  Timing may be impacted by postponement of elective procedures mandated under the covid-19 guidance. She understands this.

## 2019-02-09 ENCOUNTER — Telehealth: Payer: Self-pay | Admitting: *Deleted

## 2019-02-09 ENCOUNTER — Telehealth: Payer: Self-pay | Admitting: Hematology & Oncology

## 2019-02-09 NOTE — Telephone Encounter (Signed)
sw pt daughter to confirm resched flush appt to 5/13 at 10 am per 4/9 sch msg

## 2019-02-09 NOTE — Telephone Encounter (Signed)
Call received from patient's daughter, Jessica [name] requesting to move port flush appt out for one month.  Message sent to scheduling.

## 2019-03-03 ENCOUNTER — Other Ambulatory Visit: Payer: Self-pay

## 2019-03-03 ENCOUNTER — Ambulatory Visit: Payer: Medicare Other

## 2019-03-03 DIAGNOSIS — G4733 Obstructive sleep apnea (adult) (pediatric): Secondary | ICD-10-CM | POA: Diagnosis not present

## 2019-03-08 DIAGNOSIS — G4733 Obstructive sleep apnea (adult) (pediatric): Secondary | ICD-10-CM

## 2019-03-09 DIAGNOSIS — H2640 Unspecified secondary cataract: Secondary | ICD-10-CM | POA: Diagnosis not present

## 2019-03-09 DIAGNOSIS — H04123 Dry eye syndrome of bilateral lacrimal glands: Secondary | ICD-10-CM | POA: Diagnosis not present

## 2019-03-10 ENCOUNTER — Telehealth: Payer: Self-pay

## 2019-03-10 DIAGNOSIS — G4733 Obstructive sleep apnea (adult) (pediatric): Secondary | ICD-10-CM

## 2019-03-10 NOTE — Telephone Encounter (Signed)
Home sleep test showed severe obstructive sleep apnea, averaging 69 apnea/ hour with drops in blood oxygen.  The best treatment for scores in this range is CPAP  Order- new DME, new CPAP auto 5-20, mask of choice, humidifier, supplies, AirView   She will need an ov in 31-90 days, per insurance recs.

## 2019-03-10 NOTE — Telephone Encounter (Signed)
Called and spoke with pt's daughter Jessica [name] letting her know the results of pt's HST and stated to her due to pt having severe OSA, best treatment for pt is CPAP therapy. Jessica [name] expressed understanding. Order has been placed for pt's cpap start and a comment was made in there for Jessica [name] to be the one that is contacted. Also stated to Jessica [name] that we needed to change pt's current scheduled appt with [facility] per insurance recs and Jessica [name] expressed understanding. [name] appt has been rescheduled for 06/09/2019. Nothing further needed.

## 2019-03-10 NOTE — Telephone Encounter (Signed)
Spoke with pt daughter [name] [facility]) and was wondering if the results were available from [place].  Dr. Annamaria Boots please advise.

## 2019-03-15 ENCOUNTER — Other Ambulatory Visit: Payer: Self-pay

## 2019-03-15 ENCOUNTER — Inpatient Hospital Stay: Payer: Medicare Other | Attending: Hematology & Oncology

## 2019-03-15 VITALS — BP 135/71 | HR 80 | Resp 18

## 2019-03-15 DIAGNOSIS — Z86000 Personal history of in-situ neoplasm of breast: Secondary | ICD-10-CM | POA: Insufficient documentation

## 2019-03-15 DIAGNOSIS — Z923 Personal history of irradiation: Secondary | ICD-10-CM | POA: Insufficient documentation

## 2019-03-15 DIAGNOSIS — Z9221 Personal history of antineoplastic chemotherapy: Secondary | ICD-10-CM | POA: Diagnosis not present

## 2019-03-15 DIAGNOSIS — Z171 Estrogen receptor negative status [ER-]: Secondary | ICD-10-CM | POA: Diagnosis not present

## 2019-03-15 DIAGNOSIS — Z452 Encounter for adjustment and management of vascular access device: Secondary | ICD-10-CM | POA: Diagnosis not present

## 2019-03-15 DIAGNOSIS — Z95828 Presence of other vascular implants and grafts: Secondary | ICD-10-CM

## 2019-03-15 MED ORDER — HEPARIN SOD (PORK) LOCK FLUSH 100 UNIT/ML IV SOLN
500.0000 [IU] | Freq: Once | INTRAVENOUS | Status: AC
  Administered 2019-03-15: 500 [IU] via INTRAVENOUS
  Filled 2019-03-15: qty 5

## 2019-03-15 MED ORDER — SODIUM CHLORIDE 0.9% FLUSH
10.0000 mL | Freq: Once | INTRAVENOUS | Status: AC
  Administered 2019-03-15: 10:00:00 10 mL via INTRAVENOUS
  Filled 2019-03-15: qty 10

## 2019-03-15 NOTE — Patient Instructions (Signed)

## 2019-03-16 ENCOUNTER — Ambulatory Visit
Admission: RE | Admit: 2019-03-16 | Discharge: 2019-03-16 | Disposition: A | Discharge status: Home / Self Care | Payer: Medicare Other | Source: Ambulatory Visit | Attending: Hematology & Oncology | Admitting: Hematology & Oncology

## 2019-03-16 DIAGNOSIS — Z853 Personal history of malignant neoplasm of breast: Secondary | ICD-10-CM

## 2019-03-20 DIAGNOSIS — Z961 Presence of intraocular lens: Secondary | ICD-10-CM | POA: Diagnosis not present

## 2019-03-20 DIAGNOSIS — H04123 Dry eye syndrome of bilateral lacrimal glands: Secondary | ICD-10-CM | POA: Diagnosis not present

## 2019-03-20 DIAGNOSIS — H2640 Unspecified secondary cataract: Secondary | ICD-10-CM | POA: Diagnosis not present

## 2019-03-20 DIAGNOSIS — H26492 Other secondary cataract, left eye: Secondary | ICD-10-CM | POA: Diagnosis not present

## 2019-03-20 DIAGNOSIS — H179 Unspecified corneal scar and opacity: Secondary | ICD-10-CM | POA: Diagnosis not present

## 2019-03-22 DIAGNOSIS — G4733 Obstructive sleep apnea (adult) (pediatric): Secondary | ICD-10-CM | POA: Diagnosis not present

## 2019-04-17 ENCOUNTER — Inpatient Hospital Stay (HOSPITAL_BASED_OUTPATIENT_CLINIC_OR_DEPARTMENT_OTHER): Payer: Medicare Other | Admitting: Hematology & Oncology

## 2019-04-17 ENCOUNTER — Inpatient Hospital Stay: Payer: Medicare Other | Attending: Hematology & Oncology

## 2019-04-17 ENCOUNTER — Inpatient Hospital Stay: Payer: Medicare Other

## 2019-04-17 ENCOUNTER — Other Ambulatory Visit: Payer: Self-pay

## 2019-04-17 ENCOUNTER — Encounter: Payer: Self-pay | Admitting: Hematology & Oncology

## 2019-04-17 VITALS — BP 140/85 | HR 76 | Temp 98.0°F | Resp 17 | Wt 177.0 lb

## 2019-04-17 DIAGNOSIS — Z86000 Personal history of in-situ neoplasm of breast: Secondary | ICD-10-CM | POA: Insufficient documentation

## 2019-04-17 DIAGNOSIS — Z923 Personal history of irradiation: Secondary | ICD-10-CM

## 2019-04-17 DIAGNOSIS — Z171 Estrogen receptor negative status [ER-]: Secondary | ICD-10-CM | POA: Diagnosis not present

## 2019-04-17 DIAGNOSIS — Z9221 Personal history of antineoplastic chemotherapy: Secondary | ICD-10-CM

## 2019-04-17 DIAGNOSIS — Z452 Encounter for adjustment and management of vascular access device: Secondary | ICD-10-CM | POA: Diagnosis not present

## 2019-04-17 DIAGNOSIS — Z79899 Other long term (current) drug therapy: Secondary | ICD-10-CM | POA: Insufficient documentation

## 2019-04-17 DIAGNOSIS — C50212 Malignant neoplasm of upper-inner quadrant of left female breast: Secondary | ICD-10-CM

## 2019-04-17 LAB — CMP (CANCER CENTER ONLY)
ALT: 14 U/L (ref 0–44)
AST: 17 U/L (ref 15–41)
Albumin: 4.3 g/dL (ref 3.5–5.0)
Alkaline Phosphatase: 48 U/L (ref 38–126)
Anion gap: 9 (ref 5–15)
BUN: 14 mg/dL (ref 8–23)
CO2: 30 mmol/L (ref 22–32)
Calcium: 9.8 mg/dL (ref 8.9–10.3)
Chloride: 100 mmol/L (ref 98–111)
Creatinine: 0.72 mg/dL (ref 0.44–1.00)
GFR, Est AFR Am: >60 mL/min (ref >60)
GFR, Estimated: >60 mL/min (ref >60)
Glucose, Bld: 124 mg/dL — ABNORMAL HIGH (ref 70–99)
Potassium: 3.6 mmol/L (ref 3.5–5.1)
Sodium: 139 mmol/L (ref 135–145)
Total Bilirubin: 0.4 mg/dL (ref 0.3–1.2)
Total Protein: 7 g/dL (ref 6.5–8.1)

## 2019-04-17 LAB — CBC WITH DIFFERENTIAL (CANCER CENTER ONLY)
Abs Immature Granulocytes: 0.01 10*3/uL (ref 0.00–0.07)
Basophils Absolute: 0 10*3/uL (ref 0.0–0.1)
Basophils Relative: 0 %
Eosinophils Absolute: 0 10*3/uL (ref 0.0–0.5)
Eosinophils Relative: 0 %
HCT: 37.2 % (ref 36.0–46.0)
Hemoglobin: 12.1 g/dL (ref 12.0–15.0)
Immature Granulocytes: 0 %
Lymphocytes Relative: 28 %
Lymphs Abs: 1.3 10*3/uL (ref 0.7–4.0)
MCH: 32.8 pg (ref 26.0–34.0)
MCHC: 32.5 g/dL (ref 30.0–36.0)
MCV: 100.8 fL — ABNORMAL HIGH (ref 80.0–100.0)
Monocytes Absolute: 0.4 10*3/uL (ref 0.1–1.0)
Monocytes Relative: 8 %
Neutro Abs: 3.1 10*3/uL (ref 1.7–7.7)
Neutrophils Relative %: 64 %
Platelet Count: 236 10*3/uL (ref 150–400)
RBC: 3.69 MIL/uL — ABNORMAL LOW (ref 3.87–5.11)
RDW: 12.3 % (ref 11.5–15.5)
WBC Count: 4.8 10*3/uL (ref 4.0–10.5)
nRBC: 0 % (ref 0.0–0.2)

## 2019-04-17 NOTE — Progress Notes (Signed)
Hematology and Oncology Follow Up Visit  Jessica [name] [id number] 10/03/1941 78 y.o. 04/17/2019   Principle Diagnosis:  Stage IIA ([id number]) invasive ductal ca of the LEFT breast - TRIPLE NEGATIVE Staphylococcus aureus wound infection of the LEFT axilla Cellulitis of the left breast  Past Therapy:   Taxotere/Carboplatin - s/p cycle #4 -completed adjuvant therapy on 09/09/2017 Radiation therapy to the right breast.-Completed 4005 rad with an additional 1000 rad boost on 11/11/2017  Current Therapy: Observation   Interim History:  Jessica [name] is here today with her daughter, who was on the cell phone,  for follow-up.  So far, everything is going okay for her.  She is at home.  Breast mass to a few months ago so this is been a little bit tough on her.  She has had no issues with nausea or vomiting.  Her breast cancer site surgery and follow-up surgeries for infection has healed nicely.  She has had no change in bowel or bladder habits.  There is been no fever.  She is watching her self very closely with the coronavirus normal.  She of her going to the beach for a couple weeks.  [facility] will be going to [facility].  She has had no bleeding.  There is been no leg swelling.  She has had no rashes.  She still has her Port-A-Cath in.  She had this flushed today.  Overall, her performance status is ECOG 1.   Medications:  Allergies as of 04/17/2019      Reactions   Codeine Hives   Penicillins Hives   Has patient had a PCN reaction causing immediate rash, facial/tongue/throat swelling, SOB or lightheadedness with hypotension: yes Has patient had a PCN reaction causing severe rash involving mucus membranes or skin necrosis:no Has patient had a PCN reaction that required hospitalization:no Has patient had a PCN reaction occurring within the last 10 years: no If all of the above answers are "NO", then may proceed with Cephalosporin use. Has patient had a PCN reaction causing  immediate rash, facial/tongue/throat swelling, SOB or lightheadedness with hypotension: yes Has patient had a PCN reaction causing severe rash involving mucus membranes or skin necrosis:  no Has patient had a PCN reaction that required hospitalization:  no Has patient had a PCN reaction occurring within the last 10 years: no If all of the above answers are "NO", then may proceed with Cephalosporin use.   Advil [ibuprofen] Hives, Itching   Band-aid Plus Antibiotic [bacitracin-polymyxin B] Dermatitis   Benazepril Swelling   Possible cause of tongue swelling   Other Rash   Band-aid      Medication List       Accurate as of April 17, 2019  2:15 PM. If you have any questions, ask your nurse or doctor.        diphenhydramine-acetaminophen 25-500 MG Tabs tablet Commonly known as: TYLENOL PM Take 1 tablet by mouth at bedtime as needed (sleep).   Generlac 10 GM/15ML Soln Generic drug: lactulose (encephalopathy) TAKE 30 ML BY MOUTH EVERY 4 HOURS AS NEEDED FOR MILD CONSTIPATION UNTIL BOWEL MOVEMENT   hyaluronate sodium Gel Apply 1 application topically once.   hydrochlorothiazide 12.5 MG capsule Commonly known as: MICROZIDE Take 12.5 mg by mouth daily.   levothyroxine 75 MCG tablet Commonly known as: SYNTHROID Take 75 mcg by mouth daily.   lidocaine-prilocaine cream Commonly known as: EMLA Apply 1 application topically as needed.   LORazepam 0.5 MG tablet Commonly known as: Ativan Take 1 tablet (0.5  mg total) by mouth every 6 (six) hours as needed (Nausea or vomiting).   Lysine 1000 MG Tabs Take 1,000 mg by mouth as needed.   multivitamin tablet Take 1 tablet by mouth daily.   omeprazole 20 MG capsule Commonly known as: PRILOSEC   ondansetron 8 MG tablet Commonly known as: Zofran Take 1 tablet (8 mg total) by mouth 2 (two) times daily as needed for refractory nausea / vomiting. Start on day 3 after chemo.   OVER THE COUNTER MEDICATION Take 15 mLs by mouth 2 (two)  times daily as needed (pain).   polyvinyl alcohol 1.4 % ophthalmic solution Commonly known as: LIQUIFILM TEARS 1 drop as needed for dry eyes.   potassium chloride SA 20 MEQ tablet Commonly known as: K-DUR Take 2 tablets (40 mEq total) daily by mouth.   prochlorperazine 10 MG tablet Commonly known as: COMPAZINE TAKE 1 TABLET(10 MG) BY MOUTH EVERY 6 HOURS AS NEEDED FOR NAUSEA OR VOMITING   venlafaxine XR 75 MG 24 hr capsule Commonly known as: EFFEXOR-XR Take 225 mg by mouth daily.       Allergies:  Allergies  Allergen Reactions  . Codeine Hives  . Penicillins Hives    Has patient had a PCN reaction causing immediate rash, facial/tongue/throat swelling, SOB or lightheadedness with hypotension: yes Has patient had a PCN reaction causing severe rash involving mucus membranes or skin necrosis:no Has patient had a PCN reaction that required hospitalization:no Has patient had a PCN reaction occurring within the last 10 years: no If all of the above answers are "NO", then may proceed with Cephalosporin use. Has patient had a PCN reaction causing immediate rash, facial/tongue/throat swelling, SOB or lightheadedness with hypotension: yes Has patient had a PCN reaction causing severe rash involving mucus membranes or skin necrosis:  no Has patient had a PCN reaction that required hospitalization:  no Has patient had a PCN reaction occurring within the last 10 years: no If all of the above answers are "NO", then may proceed with Cephalosporin use.   . Advil [Ibuprofen] Hives and Itching  . Band-Aid Plus Antibiotic [Bacitracin-Polymyxin B] Dermatitis  . Benazepril Swelling    Possible cause of tongue swelling  . Other Rash    Band-aid    Past Medical History, Surgical history, Social history, and Family History were reviewed and updated.  Review of Systems: Review of Systems  Constitutional: Negative.   HENT: Negative.   Eyes: Negative.   Respiratory: Negative.    Cardiovascular: Negative.   Gastrointestinal: Negative.   Genitourinary: Negative.   Musculoskeletal: Negative.   Skin: Negative.   Neurological: Negative.   Endo/Heme/Allergies: Negative.   Psychiatric/Behavioral: Negative.      Physical Exam:  weight is 177 lb (80.3 kg). Her oral temperature is 98 F (36.7 C). Her blood pressure is 140/85 and her pulse is 76. Her respiration is 17 and oxygen saturation is 97%.   Wt Readings from Last 3 Encounters:  04/17/19 177 lb (80.3 kg)  04/17/19 177 lb (80.3 kg)  01/19/19 171 lb 9.6 oz (77.8 kg)    Physical Exam Vitals signs reviewed.  HENT:     Head: Normocephalic and atraumatic.  Eyes:     Pupils: Pupils are equal, round, and reactive to light.  Neck:     Musculoskeletal: Normal range of motion.  Cardiovascular:     Rate and Rhythm: Normal rate and regular rhythm.     Heart sounds: Normal heart sounds.  Pulmonary:     Effort: Pulmonary  effort is normal.     Breath sounds: Normal breath sounds.  Abdominal:     General: Bowel sounds are normal.     Palpations: Abdomen is soft.  Musculoskeletal: Normal range of motion.        General: No tenderness or deformity.  Lymphadenopathy:     Cervical: No cervical adenopathy.  Skin:    General: Skin is warm and dry.     Findings: No erythema or rash.  Neurological:     Mental Status: She is alert and oriented to person, place, and time.  Psychiatric:        Behavior: Behavior normal.        Thought Content: Thought content normal.        Judgment: Judgment normal.      Lab Results  Component Value Date   WBC 4.8 04/17/2019   HGB 12.1 04/17/2019   HCT 37.2 04/17/2019   MCV 100.8 (H) 04/17/2019   PLT 236 04/17/2019   Lab Results  Component Value Date   FERRITIN 6 (L) 12/14/2018   IRON 141 12/14/2018   TIBC 396 12/14/2018   UIBC 256 12/14/2018   IRONPCTSAT 35 12/14/2018   Lab Results  Component Value Date   RBC 3.69 (L) 04/17/2019   No results found for:  KPAFRELGTCHN, LAMBDASER, KAPLAMBRATIO No results found for: IGGSERUM, IGA, IGMSERUM No results found for: Odetta Pink, SPEI   Chemistry      Component Value Date/Time   NA 139 04/17/2019 1145   NA 143 10/08/2017 1042   NA 141 06/16/2017 1014   K 3.6 04/17/2019 1145   K 3.4 10/08/2017 1042   K 3.8 06/16/2017 1014   CL 100 04/17/2019 1145   CL 100 10/08/2017 1042   CO2 30 04/17/2019 1145   CO2 30 10/08/2017 1042   CO2 31 (H) 06/16/2017 1014   BUN 14 04/17/2019 1145   BUN 10 10/08/2017 1042   BUN 8.9 06/16/2017 1014   CREATININE 0.72 04/17/2019 1145   CREATININE 0.9 10/08/2017 1042   CREATININE 0.8 06/16/2017 1014      Component Value Date/Time   CALCIUM 9.8 04/17/2019 1145   CALCIUM 9.5 10/08/2017 1042   CALCIUM 10.1 06/16/2017 1014   ALKPHOS 48 04/17/2019 1145   ALKPHOS 68 10/08/2017 1042   ALKPHOS 64 06/16/2017 1014   AST 17 04/17/2019 1145   AST 21 06/16/2017 1014   ALT 14 04/17/2019 1145   ALT 20 10/08/2017 1042   ALT 20 06/16/2017 1014   BILITOT 0.4 04/17/2019 1145   BILITOT 0.37 06/16/2017 1014      Impression and Plan: Ms. Scoles is a very pleasant 78 yo caucasian female with stage IIA ductal carcinoma of the left breast.  Her breast cancer is TRIPLE NEGATIVE.    She had her partial mastectomy in May 2018 followed by chemotherapy and radiation.  She completed chemotherapy in November 2018.  She then had radiation and completed radiation in January 2019.  Right now, I do not see any evidence of recurrent disease.  Everything looks quite well.  I think we get her back in 4 more months.  I think this would be reasonable.   We will make sure that she has her Port-A-Cath flushed every 2 months.  [name], MD 6/15/20202:15 PM

## 2019-04-17 NOTE — Patient Instructions (Signed)

## 2019-04-21 ENCOUNTER — Encounter: Payer: Self-pay | Admitting: Internal Medicine

## 2019-04-22 DIAGNOSIS — G4733 Obstructive sleep apnea (adult) (pediatric): Secondary | ICD-10-CM | POA: Diagnosis not present

## 2019-04-24 ENCOUNTER — Other Ambulatory Visit: Payer: Self-pay

## 2019-04-24 ENCOUNTER — Ambulatory Visit: Payer: Medicare Other | Admitting: Internal Medicine

## 2019-04-24 ENCOUNTER — Encounter: Payer: Self-pay | Admitting: Internal Medicine

## 2019-04-24 VITALS — BP 122/72 | HR 79 | Temp 97.8°F | Ht 63.0 in | Wt 176.4 lb

## 2019-04-24 DIAGNOSIS — C50212 Malignant neoplasm of upper-inner quadrant of left female breast: Secondary | ICD-10-CM

## 2019-04-24 DIAGNOSIS — G4733 Obstructive sleep apnea (adult) (pediatric): Secondary | ICD-10-CM | POA: Diagnosis not present

## 2019-04-24 MED ORDER — ZALEPLON 5 MG PO CAPS: 5.0000 mg | ORAL_CAPSULE | Freq: Every evening | ORAL | 5 refills | Status: DC | PRN

## 2019-04-24 NOTE — Progress Notes (Signed)
01/19/2019- 78 yo F for sleep consult. referred by PCP (Dr. Shelia Media) for snoring Medical hx L breast CA/ partial mastectomy/XRT/chemo, HBP, GERD, Hypothyroid, Obesity Body weight 171 lbs Epworth score 11 She has been told by family and friends that she snores, witnessed apneas.  Sleeps soundly-mouth breather.  Denies history of ENT surgery except tonsils.  Remote history asthma no longer bothers her.  Admits some daytime sleepiness.  She is retired and widowed-able to nap or doze off without impacting quality of life.  Denies problems driving. Very difficult past year with treatment for left breast cancer and loss of husband.  She now feels able to cope with this problem.  Does have family support.  04/24/2019-  48 yo F followed for OSA, complicated by  L breast CA/ partial mastectomy/XRT/chemo, HBP, GERD, Hypothyroid, ObesityROS-see HPI   + = positive CPAP auto 5-20/ [facility]    Range is 9.3- 15.9 Download compliance 80%, AHI 7.2/ hr She and daughter both have cancer. Admits tense and anxious time. Finding CPAP hard. Some mask leak- nasalmask. Mostly fretful and fidgets at night. Discussed relaxation  ROS  See HPI    + = positive Constitutional:    weight loss, night sweats, fevers, chills, fatigue, lassitude. HEENT:    headaches, difficulty swallowing, tooth/dental problems, sore throat,       sneezing, itching, ear ache, nasal congestion, post nasal drip, snoring CV:    chest pain, orthopnea, PND, swelling in lower extremities, anasarca,                                  dizziness, palpitations Resp:   shortness of breath with exertion or at rest.                productive cough,   non-productive cough, coughing up of blood.              change in color of mucus.  wheezing.   Skin:    rash or lesions. GI:  No-   heartburn, indigestion, abdominal pain, nausea, vomiting, diarrhea,                 change in bowel habits, loss of appetite GU: dysuria, change in color of urine, no urgency or  frequency.   flank pain. MS:   joint pain, stiffness, decreased range of motion, back pain. Neuro-     nothing unusual Psych:  change in mood or affect.  depression or anxiety.   memory loss.  OBJ- Physical Exam General- Alert, Oriented, Affect-appropriate, Distress- none acute, + overweight Skin- rash-none, lesions- none, excoriation- none Lymphadenopathy- none Head- atraumatic            Eyes- Gross vision intact, PERRLA, conjunctivae and secretions clear            Ears- Hearing, canals-normal            Nose- Clear, no-Septal dev, mucus, polyps, erosion, perforation             Throat- Mallampati II-III , mucosa clear , drainage- none, tonsils- atrophic, own teeth Neck- flexible , trachea midline, no stridor , thyroid nl, carotid no bruit Chest - symmetrical excursion , unlabored           Heart/CV- RRR , no murmur , no gallop  , no rub, nl s1 s2                           -  JVD- none , edema- none, stasis changes- none, varices- none           Lung- clear to P&A, wheeze- none, cough- none , dullness-none, rub- none           Chest wall- + port R upper  Abd-  Br/ Gen/ Rectal- Not done, not indicated Extrem- cyanosis- none, clubbing, none, atrophy- none, strength- nl Neuro- grossly intact to observation

## 2019-04-24 NOTE — Patient Instructions (Signed)
Script sent for Sonata to help sleep if needed  Order- DME [facility]- please change autopap range to 5-16 and work with patient on mask comfort .  Order- refer to daytime Sleep Center technician for CPAP desensitization  Please call as needed

## 2019-05-12 ENCOUNTER — Other Ambulatory Visit (HOSPITAL_COMMUNITY)
Admission: RE | Admit: 2019-05-12 | Discharge: 2019-05-12 | Disposition: A | Discharge status: Home / Self Care | Payer: Medicare Other | Source: Ambulatory Visit | Attending: Internal Medicine | Admitting: Internal Medicine

## 2019-05-12 DIAGNOSIS — Z1159 Encounter for screening for other viral diseases: Secondary | ICD-10-CM | POA: Insufficient documentation

## 2019-05-12 DIAGNOSIS — Z01812 Encounter for preprocedural laboratory examination: Secondary | ICD-10-CM | POA: Insufficient documentation

## 2019-05-12 LAB — SARS CORONAVIRUS 2 (TAT 6-24 HRS): SARS Coronavirus 2: NEGATIVE

## 2019-05-15 ENCOUNTER — Telehealth: Payer: Self-pay | Admitting: *Deleted

## 2019-05-15 NOTE — Telephone Encounter (Signed)
Message received from patient's daughter requesting to change pt.'s port flush appt in August and also wanting to know someone for pt to talk to regarding anxiety.  Pt.'s daughter notified that Dr. Marin Olp is not in the office today and we will call her back about someone to talk to regarding anxiety.  Pt.'s daughter transferred to scheduling.

## 2019-05-15 NOTE — Telephone Encounter (Signed)
Call placed back to patient's daughter [name] to inform her that Myrtie Cruise [phone] is the name of the psychiatrist that Dr. Marin Olp recommends per S. Liberty NP.  [name] appreciative of information.

## 2019-05-16 ENCOUNTER — Other Ambulatory Visit: Payer: Self-pay

## 2019-05-16 ENCOUNTER — Ambulatory Visit (HOSPITAL_BASED_OUTPATIENT_CLINIC_OR_DEPARTMENT_OTHER): Payer: Medicare Other | Attending: Internal Medicine

## 2019-05-16 DIAGNOSIS — G4733 Obstructive sleep apnea (adult) (pediatric): Secondary | ICD-10-CM

## 2019-05-22 DIAGNOSIS — G4733 Obstructive sleep apnea (adult) (pediatric): Secondary | ICD-10-CM | POA: Diagnosis not present

## 2019-06-03 ENCOUNTER — Encounter: Payer: Self-pay | Admitting: Internal Medicine

## 2019-06-07 DIAGNOSIS — Z9989 Dependence on other enabling machines and devices: Secondary | ICD-10-CM | POA: Diagnosis not present

## 2019-06-07 DIAGNOSIS — R400 Somnolence: Secondary | ICD-10-CM | POA: Diagnosis not present

## 2019-06-07 DIAGNOSIS — I10 Essential (primary) hypertension: Secondary | ICD-10-CM | POA: Diagnosis not present

## 2019-06-07 DIAGNOSIS — R5383 Other fatigue: Secondary | ICD-10-CM | POA: Diagnosis not present

## 2019-06-07 DIAGNOSIS — N390 Urinary tract infection, site not specified: Secondary | ICD-10-CM | POA: Diagnosis not present

## 2019-06-07 DIAGNOSIS — G4733 Obstructive sleep apnea (adult) (pediatric): Secondary | ICD-10-CM | POA: Diagnosis not present

## 2019-06-09 ENCOUNTER — Encounter: Payer: Self-pay | Admitting: Internal Medicine

## 2019-06-09 ENCOUNTER — Other Ambulatory Visit: Payer: Self-pay

## 2019-06-09 ENCOUNTER — Ambulatory Visit: Payer: Medicare Other | Admitting: Internal Medicine

## 2019-06-09 VITALS — BP 138/88 | HR 75 | Temp 98.6°F | Ht 63.0 in | Wt 176.4 lb

## 2019-06-09 DIAGNOSIS — G4733 Obstructive sleep apnea (adult) (pediatric): Secondary | ICD-10-CM

## 2019-06-09 DIAGNOSIS — G4701 Insomnia due to medical condition: Secondary | ICD-10-CM | POA: Diagnosis not present

## 2019-06-09 NOTE — Patient Instructions (Addendum)
Order- DME [facility]- please change to nasal pillows mask type with chin strap for better seal and compliance. If not yet eligible please let us know. Continue auto 5-16, humidifier, supplies, AirView  Please call if we can help

## 2019-06-09 NOTE — Progress Notes (Signed)
HPI F followed for OSA, complicated by  L breast CA/ partial mastectomy/XRT/chemo, HBP, GERD, Hypothyroid, ObesityROS HST 03/03/2019 AHI 69.7, desaturation to 81%, body weight 171 lbs --------------------------------------------------------------------  04/24/2019-  78 yo F followed for OSA, complicated by  L breast CA/ partial mastectomy/XRT/chemo, HBP, GERD, Hypothyroid, Obesity HST 03/03/2019 AHI 69.7, desaturation to 81%, body weight 171 lbs -----OSA on CPAP auto 5-16, DME: [facility]; pt states the pressure of her CPAP is uncomfortable for her eyes Body weight today- 176 lbs CPAP auto 5-16/ [facility] Download compliance 60%, AHI 7.5/ hr     Ranges 9.9- 14.4 Not sleeping well. Sonata gave "crazy dreams" and she changed to tylenol.  ROS-see HPI   + = positive Constitutional:    weight loss, night sweats, fevers, chills, fatigue, lassitude. HEENT:    headaches, difficulty swallowing, tooth/dental problems, sore throat,       sneezing, itching, ear ache, nasal congestion, post nasal drip, snoring CV:    chest pain, orthopnea, PND, swelling in lower extremities, anasarca,                                  dizziness, palpitations Resp:   shortness of breath with exertion or at rest.                productive cough,   non-productive cough, coughing up of blood.              change in color of mucus.  wheezing.   Skin:    rash or lesions. GI:  No-   heartburn, indigestion, abdominal pain, nausea, vomiting, diarrhea,                 change in bowel habits, loss of appetite GU: dysuria, change in color of urine, no urgency or frequency.   flank pain. MS:   joint pain, stiffness, decreased range of motion, back pain. Neuro-     nothing unusual Psych:  change in mood or affect.  depression or anxiety.   memory loss.  OBJ- Physical Exam General- Alert, Oriented, Affect-appropriate, Distress- none acute, + overweight Skin- rash-none, lesions- none, excoriation- none Lymphadenopathy- none Head- atraumatic           Eyes- Gross vision intact, PERRLA, conjunctivae and secretions clear            Ears- Hearing, canals-normal            Nose- Clear, no-Septal dev, mucus, polyps, erosion, perforation             Throat- Mallampati II-III , mucosa clear , drainage- none, tonsils- atrophic, own teeth Neck- flexible , trachea midline, no stridor , thyroid nl, carotid no bruit Chest - symmetrical excursion , unlabored           Heart/CV- RRR , no murmur , no gallop  , no rub, nl s1 s2                           - JVD- none , edema- none, stasis changes- none, varices- none           Lung- clear to P&A, wheeze- none, cough- none , dullness-none, rub- none           Chest wall- + port R upper chest Abd-  Br/ Gen/ Rectal- Not done, not indicated Extrem- cyanosis- none, clubbing, none, atrophy- none, strength- nl Neuro- grossly intact to  observation

## 2019-06-13 ENCOUNTER — Inpatient Hospital Stay: Payer: Medicare Other | Attending: Hematology & Oncology

## 2019-06-13 ENCOUNTER — Other Ambulatory Visit: Payer: Self-pay

## 2019-06-13 VITALS — BP 155/98 | HR 79 | Temp 97.8°F | Resp 18

## 2019-06-13 DIAGNOSIS — Z452 Encounter for adjustment and management of vascular access device: Secondary | ICD-10-CM | POA: Insufficient documentation

## 2019-06-13 DIAGNOSIS — Z9221 Personal history of antineoplastic chemotherapy: Secondary | ICD-10-CM | POA: Insufficient documentation

## 2019-06-13 DIAGNOSIS — Z923 Personal history of irradiation: Secondary | ICD-10-CM | POA: Diagnosis not present

## 2019-06-13 DIAGNOSIS — C50212 Malignant neoplasm of upper-inner quadrant of left female breast: Secondary | ICD-10-CM

## 2019-06-13 DIAGNOSIS — Z171 Estrogen receptor negative status [ER-]: Secondary | ICD-10-CM | POA: Insufficient documentation

## 2019-06-13 DIAGNOSIS — Z86000 Personal history of in-situ neoplasm of breast: Secondary | ICD-10-CM | POA: Insufficient documentation

## 2019-06-13 DIAGNOSIS — G4733 Obstructive sleep apnea (adult) (pediatric): Secondary | ICD-10-CM | POA: Insufficient documentation

## 2019-06-13 MED ORDER — HEPARIN SOD (PORK) LOCK FLUSH 100 UNIT/ML IV SOLN
500.0000 [IU] | Freq: Once | INTRAVENOUS | Status: AC
  Administered 2019-06-13: 500 [IU] via INTRAVENOUS
  Filled 2019-06-13: qty 5

## 2019-06-13 MED ORDER — SODIUM CHLORIDE 0.9% FLUSH
10.0000 mL | Freq: Once | INTRAVENOUS | Status: AC
  Administered 2019-06-13: 14:00:00 10 mL
  Filled 2019-06-13: qty 10

## 2019-06-13 NOTE — Patient Instructions (Signed)

## 2019-06-13 NOTE — Assessment & Plan Note (Signed)
Managed by Oncology. She is currently also worried about her daughter's cancer as well.

## 2019-06-13 NOTE — Assessment & Plan Note (Signed)
She is dealing with a lot of stress issues that make CPAP harder, but I think she will be ok,. She is meeting initial goals. Plan continue CPAP auto 5-16, send for CPAP desensitization, add Sonata with discussion

## 2019-06-19 DIAGNOSIS — N390 Urinary tract infection, site not specified: Secondary | ICD-10-CM | POA: Diagnosis not present

## 2019-06-19 DIAGNOSIS — R300 Dysuria: Secondary | ICD-10-CM | POA: Diagnosis not present

## 2019-06-22 DIAGNOSIS — G4733 Obstructive sleep apnea (adult) (pediatric): Secondary | ICD-10-CM | POA: Diagnosis not present

## 2019-06-25 DIAGNOSIS — G4700 Insomnia, unspecified: Secondary | ICD-10-CM | POA: Insufficient documentation

## 2019-06-25 NOTE — Assessment & Plan Note (Signed)
Stress from personal and family health problems is a challenge. Sleep hygiene reviewed. Didn't like Sonata. Discussed tylenol vs tylenolPM or other otc AH sleep aid.

## 2019-06-25 NOTE — Assessment & Plan Note (Signed)
I think she is bothered by leak and fit of mask. We will try refitting mask and consider change to nasal pillows. If no better, consider lower pressure or explore oral appliance.

## 2019-07-05 ENCOUNTER — Telehealth: Payer: Self-pay | Admitting: Internal Medicine

## 2019-07-05 DIAGNOSIS — G4733 Obstructive sleep apnea (adult) (pediatric): Secondary | ICD-10-CM

## 2019-07-05 NOTE — Telephone Encounter (Signed)
Returned call to daughter,Karen (on dpr) who states the patient is still unable to sleep with Cpap due to equipment noise and unable to find a mask fit that she can tolerate.  States they have worked with DME on these issues and have tried 4 different types of masks - currently using nasal pillows as per LOV with Dr. Annamaria Boots.    Daughter says the patient is dealing with significant depression after the loss of spouse in December and now with covid isolation from friends and activities.  Reports they are all 'at wits end' on what to regarding cpap compliance and are also very concerned about the depression.  Tried sleep aid recommended by Dr. Annamaria Boots but as stated at last visit the patient had 'wild dreams' and could not continue it.    LOV 06/09/19 Dr. Annamaria Boots:  OSA/Insomnia  Stress from personal and family health problems is a challenge. Sleep hygiene reviewed. Didn't like Sonata. Discussed tylenol vs tylenolPM or other otc AH sleep aid.   I think she is bothered by leak and fit of mask. We will try refitting mask and consider change to nasal pillows. If no better, consider lower pressure or explore oral appliance.  Order- DME [facility]- please change to nasal pillows mask type with chin strap for better seal and compliance. If not yet eligible please let us know. Continue auto 5-16, humidifier, supplies, AirView  Routed to Dr. Annamaria Boots for review/recommendations.

## 2019-07-05 NOTE — Telephone Encounter (Signed)
The primary problem here may be depression, which is understandable.  I think the CPAP is just going to be too much for her to cope with.  I suggest we offer to refer her to Dr Ron Parker, Orthodontics, to learn about oral appliances as an alternative to CPAP therapy since she has severe obstructive sleep apnea.

## 2019-07-06 NOTE — Telephone Encounter (Signed)
Contacted daughter, [name], with Dr. Janee Morn recommendations.  [name] states they are agreeable to the referral for oral appliance to discuss that option.  Order placed.  Nothing further needed at this time.

## 2019-07-14 ENCOUNTER — Telehealth: Payer: Self-pay | Admitting: Hematology & Oncology

## 2019-07-14 NOTE — Telephone Encounter (Signed)
I called and spoke with daughter regarding moving appointment up earlier to to overbooking in the lab at original appt time.  She was on with new time

## 2019-07-17 ENCOUNTER — Inpatient Hospital Stay: Payer: Medicare Other

## 2019-07-17 ENCOUNTER — Other Ambulatory Visit: Payer: Medicare Other

## 2019-07-17 ENCOUNTER — Ambulatory Visit: Payer: Medicare Other | Admitting: Hematology & Oncology

## 2019-07-17 ENCOUNTER — Other Ambulatory Visit: Payer: Self-pay

## 2019-07-17 ENCOUNTER — Other Ambulatory Visit: Payer: Self-pay | Admitting: *Deleted

## 2019-07-17 ENCOUNTER — Inpatient Hospital Stay (HOSPITAL_BASED_OUTPATIENT_CLINIC_OR_DEPARTMENT_OTHER): Payer: Medicare Other | Admitting: Hematology & Oncology

## 2019-07-17 ENCOUNTER — Encounter: Payer: Self-pay | Admitting: Hematology & Oncology

## 2019-07-17 ENCOUNTER — Inpatient Hospital Stay: Payer: Medicare Other | Attending: Hematology & Oncology

## 2019-07-17 VITALS — BP 150/77 | HR 84 | Temp 97.1°F | Resp 18 | Ht 63.0 in | Wt 179.0 lb

## 2019-07-17 DIAGNOSIS — Z95828 Presence of other vascular implants and grafts: Secondary | ICD-10-CM

## 2019-07-17 DIAGNOSIS — G4733 Obstructive sleep apnea (adult) (pediatric): Secondary | ICD-10-CM | POA: Diagnosis not present

## 2019-07-17 DIAGNOSIS — C50212 Malignant neoplasm of upper-inner quadrant of left female breast: Secondary | ICD-10-CM

## 2019-07-17 DIAGNOSIS — Z923 Personal history of irradiation: Secondary | ICD-10-CM | POA: Insufficient documentation

## 2019-07-17 DIAGNOSIS — Z79899 Other long term (current) drug therapy: Secondary | ICD-10-CM | POA: Insufficient documentation

## 2019-07-17 DIAGNOSIS — E039 Hypothyroidism, unspecified: Secondary | ICD-10-CM | POA: Insufficient documentation

## 2019-07-17 DIAGNOSIS — Z170 Estrogen receptor positive status [ER+]: Secondary | ICD-10-CM

## 2019-07-17 DIAGNOSIS — Z9221 Personal history of antineoplastic chemotherapy: Secondary | ICD-10-CM | POA: Insufficient documentation

## 2019-07-17 DIAGNOSIS — E032 Hypothyroidism due to medicaments and other exogenous substances: Secondary | ICD-10-CM

## 2019-07-17 DIAGNOSIS — K2100 Gastro-esophageal reflux disease with esophagitis, without bleeding: Secondary | ICD-10-CM

## 2019-07-17 DIAGNOSIS — Z452 Encounter for adjustment and management of vascular access device: Secondary | ICD-10-CM | POA: Insufficient documentation

## 2019-07-17 DIAGNOSIS — Z86000 Personal history of in-situ neoplasm of breast: Secondary | ICD-10-CM | POA: Insufficient documentation

## 2019-07-17 DIAGNOSIS — Z171 Estrogen receptor negative status [ER-]: Secondary | ICD-10-CM | POA: Insufficient documentation

## 2019-07-17 LAB — CMP (CANCER CENTER ONLY)
ALT: 14 U/L (ref 0–44)
AST: 16 U/L (ref 15–41)
Albumin: 4 g/dL (ref 3.5–5.0)
Alkaline Phosphatase: 51 U/L (ref 38–126)
Anion gap: 9 (ref 5–15)
BUN: 12 mg/dL (ref 8–23)
CO2: 30 mmol/L (ref 22–32)
Calcium: 9.3 mg/dL (ref 8.9–10.3)
Chloride: 101 mmol/L (ref 98–111)
Creatinine: 0.87 mg/dL (ref 0.44–1.00)
GFR, Est AFR Am: >60 mL/min (ref >60)
GFR, Estimated: >60 mL/min (ref >60)
Glucose, Bld: 118 mg/dL — ABNORMAL HIGH (ref 70–99)
Potassium: 3.4 mmol/L — ABNORMAL LOW (ref 3.5–5.1)
Sodium: 140 mmol/L (ref 135–145)
Total Bilirubin: 0.4 mg/dL (ref 0.3–1.2)
Total Protein: 6.4 g/dL — ABNORMAL LOW (ref 6.5–8.1)

## 2019-07-17 LAB — CBC WITH DIFFERENTIAL (CANCER CENTER ONLY)
Abs Immature Granulocytes: 0.01 10*3/uL (ref 0.00–0.07)
Basophils Absolute: 0 10*3/uL (ref 0.0–0.1)
Basophils Relative: 0 %
Eosinophils Absolute: 0 10*3/uL (ref 0.0–0.5)
Eosinophils Relative: 0 %
HCT: 36.6 % (ref 36.0–46.0)
Hemoglobin: 11.8 g/dL — ABNORMAL LOW (ref 12.0–15.0)
Immature Granulocytes: 0 %
Lymphocytes Relative: 13 %
Lymphs Abs: 0.6 10*3/uL — ABNORMAL LOW (ref 0.7–4.0)
MCH: 31.9 pg (ref 26.0–34.0)
MCHC: 32.2 g/dL (ref 30.0–36.0)
MCV: 98.9 fL (ref 80.0–100.0)
Monocytes Absolute: 0.4 10*3/uL (ref 0.1–1.0)
Monocytes Relative: 7 %
Neutro Abs: 4 10*3/uL (ref 1.7–7.7)
Neutrophils Relative %: 80 %
Platelet Count: 210 10*3/uL (ref 150–400)
RBC: 3.7 MIL/uL — ABNORMAL LOW (ref 3.87–5.11)
RDW: 13.5 % (ref 11.5–15.5)
WBC Count: 5.1 10*3/uL (ref 4.0–10.5)
nRBC: 0 % (ref 0.0–0.2)

## 2019-07-17 MED ORDER — HEPARIN SOD (PORK) LOCK FLUSH 100 UNIT/ML IV SOLN
500.0000 [IU] | Freq: Once | INTRAVENOUS | Status: AC
  Administered 2019-07-17: 500 [IU] via INTRAVENOUS
  Filled 2019-07-17: qty 5

## 2019-07-17 MED ORDER — SODIUM CHLORIDE 0.9% FLUSH
10.0000 mL | INTRAVENOUS | Status: DC | PRN
  Administered 2019-07-17: 10 mL via INTRAVENOUS
  Filled 2019-07-17: qty 10

## 2019-07-17 NOTE — Progress Notes (Signed)
Hematology and Oncology Follow Up Visit  ADIYA LIVIGNI [id number] 03-Nov-1940 78 y.o. 07/17/2019   Principle Diagnosis:  Stage IIA (T2bN0M0) invasive ductal ca of the LEFT breast - TRIPLE NEGATIVE Staphylococcus aureus wound infection of the LEFT axilla Cellulitis of the left breast  Past Therapy:   Taxotere/Carboplatin - s/p cycle #4 -completed adjuvant therapy on 09/09/2017 Radiation therapy to the right breast.-Completed 4005 rad with an additional 1000 rad boost on 11/11/2017  Current Therapy: Observation   Interim History:  Ms. Ammons is here today for follow-up.  Unfortunately, her biggest problem is that she is not doing well with her CPAP.  She says she only gets 1 hour sleep a night.  This really is not all that conducive for a healthy lifestyle.  I am unsure what can be done about this.  She does have hypothyroidism.  I will check her TSH to make sure that this is not off.  She has had no problems with cough.  She has had no nausea or vomiting.  She has had no change in bowel or bladder habits.  There is been no rashes.  She has had no leg swelling.    She still has her Port-A-Cath in.  She had this flushed today.  Overall, her performance status is ECOG 1.   Medications:  Allergies as of 07/17/2019      Reactions   Advil [ibuprofen] Hives, Itching   Codeine Hives   Penicillins Hives   Has patient had a PCN reaction causing immediate rash, facial/tongue/throat swelling, SOB or lightheadedness with hypotension: yes Has patient had a PCN reaction causing severe rash involving mucus membranes or skin necrosis:no Has patient had a PCN reaction that required hospitalization:no Has patient had a PCN reaction occurring within the last 10 years: no If all of the above answers are "NO", then may proceed with Cephalosporin use. Has patient had a PCN reaction causing immediate rash, facial/tongue/throat swelling, SOB or lightheadedness with hypotension: yes Has patient  had a PCN reaction causing severe rash involving mucus membranes or skin necrosis:  no Has patient had a PCN reaction that required hospitalization:  no Has patient had a PCN reaction occurring within the last 10 years: no If all of the above answers are "NO", then may proceed with Cephalosporin use.   Band-aid Plus Antibiotic [bacitracin-polymyxin B] Dermatitis   Benazepril Swelling   Possible cause of tongue swelling   Other Rash   Band-aid      Medication List       Accurate as of July 17, 2019 12:03 PM. If you have any questions, ask your nurse or doctor.        diphenhydramine-acetaminophen 25-500 MG Tabs tablet Commonly known as: TYLENOL PM Take 1 tablet by mouth at bedtime as needed (sleep).   Generlac 10 GM/15ML Soln Generic drug: lactulose (encephalopathy) TAKE 30 ML BY MOUTH EVERY 4 HOURS AS NEEDED FOR MILD CONSTIPATION UNTIL BOWEL MOVEMENT   hyaluronate sodium Gel Apply 1 application topically once.   hydrochlorothiazide 12.5 MG capsule Commonly known as: MICROZIDE Take 12.5 mg by mouth daily.   levothyroxine 75 MCG tablet Commonly known as: SYNTHROID Take 75 mcg by mouth daily.   lidocaine-prilocaine cream Commonly known as: EMLA Apply 1 application topically as needed.   LORazepam 0.5 MG tablet Commonly known as: Ativan Take 1 tablet (0.5 mg total) by mouth every 6 (six) hours as needed (Nausea or vomiting).   Lysine 1000 MG Tabs Take 1,000 mg by mouth as needed.  multivitamin tablet Take 1 tablet by mouth daily.   nitrofurantoin (macrocrystal-monohydrate) 100 MG capsule Commonly known as: MACROBID Take 100 mg by mouth QID.   omeprazole 20 MG capsule Commonly known as: PRILOSEC   ondansetron 8 MG tablet Commonly known as: Zofran Take 1 tablet (8 mg total) by mouth 2 (two) times daily as needed for refractory nausea / vomiting. Start on day 3 after chemo.   OVER THE COUNTER MEDICATION Take 15 mLs by mouth 2 (two) times daily as needed  (pain).   polyvinyl alcohol 1.4 % ophthalmic solution Commonly known as: LIQUIFILM TEARS 1 drop as needed for dry eyes.   potassium chloride SA 20 MEQ tablet Commonly known as: K-DUR Take 2 tablets (40 mEq total) daily by mouth.   prochlorperazine 10 MG tablet Commonly known as: COMPAZINE TAKE 1 TABLET(10 MG) BY MOUTH EVERY 6 HOURS AS NEEDED FOR NAUSEA OR VOMITING   venlafaxine XR 75 MG 24 hr capsule Commonly known as: EFFEXOR-XR Take 225 mg by mouth daily.   zaleplon 5 MG capsule Commonly known as: Sonata Take 1 capsule (5 mg total) by mouth at bedtime as needed for sleep.       Allergies:  Allergies  Allergen Reactions   Advil [Ibuprofen] Hives and Itching   Codeine Hives   Penicillins Hives    Has patient had a PCN reaction causing immediate rash, facial/tongue/throat swelling, SOB or lightheadedness with hypotension: yes Has patient had a PCN reaction causing severe rash involving mucus membranes or skin necrosis:no Has patient had a PCN reaction that required hospitalization:no Has patient had a PCN reaction occurring within the last 10 years: no If all of the above answers are "NO", then may proceed with Cephalosporin use. Has patient had a PCN reaction causing immediate rash, facial/tongue/throat swelling, SOB or lightheadedness with hypotension: yes Has patient had a PCN reaction causing severe rash involving mucus membranes or skin necrosis:  no Has patient had a PCN reaction that required hospitalization:  no Has patient had a PCN reaction occurring within the last 10 years: no If all of the above answers are "NO", then may proceed with Cephalosporin use.    Band-Aid Plus Antibiotic [Bacitracin-Polymyxin B] Dermatitis   Benazepril Swelling    Possible cause of tongue swelling   Other Rash    Band-aid    Past Medical History, Surgical history, Social history, and Family History were reviewed and updated.  Review of Systems: Review of Systems    Constitutional: Negative.   HENT: Negative.   Eyes: Negative.   Respiratory: Negative.   Cardiovascular: Negative.   Gastrointestinal: Negative.   Genitourinary: Negative.   Musculoskeletal: Negative.   Skin: Negative.   Neurological: Negative.   Endo/Heme/Allergies: Negative.   Psychiatric/Behavioral: Negative.      Physical Exam:  height is 5\' 3"  (1.6 m) and weight is 179 lb (81.2 kg). Her temporal temperature is 97.1 F (36.2 C) (abnormal). Her blood pressure is 150/77 (abnormal) and her pulse is 84. Her respiration is 18 and oxygen saturation is 98%.   Wt Readings from Last 3 Encounters:  07/17/19 179 lb (81.2 kg)  06/09/19 176 lb 6.4 oz (80 kg)  04/24/19 176 lb 6.4 oz (80 kg)    Physical Exam Vitals signs reviewed.  HENT:     Head: Normocephalic and atraumatic.  Eyes:     Pupils: Pupils are equal, round, and reactive to light.  Neck:     Musculoskeletal: Normal range of motion.  Cardiovascular:  Rate and Rhythm: Normal rate and regular rhythm.     Heart sounds: Normal heart sounds.  Pulmonary:     Effort: Pulmonary effort is normal.     Breath sounds: Normal breath sounds.  Abdominal:     General: Bowel sounds are normal.     Palpations: Abdomen is soft.  Musculoskeletal: Normal range of motion.        General: No tenderness or deformity.  Lymphadenopathy:     Cervical: No cervical adenopathy.  Skin:    General: Skin is warm and dry.     Findings: No erythema or rash.  Neurological:     Mental Status: She is alert and oriented to person, place, and time.  Psychiatric:        Behavior: Behavior normal.        Thought Content: Thought content normal.        Judgment: Judgment normal.      Lab Results  Component Value Date   WBC 5.1 07/17/2019   HGB 11.8 (L) 07/17/2019   HCT 36.6 07/17/2019   MCV 98.9 07/17/2019   PLT 210 07/17/2019   Lab Results  Component Value Date   FERRITIN 6 (L) 12/14/2018   IRON 141 12/14/2018   TIBC 396 12/14/2018    UIBC 256 12/14/2018   IRONPCTSAT 35 12/14/2018   Lab Results  Component Value Date   RBC 3.70 (L) 07/17/2019   No results found for: KPAFRELGTCHN, LAMBDASER, KAPLAMBRATIO No results found for: IGGSERUM, IGA, IGMSERUM No results found for: Odetta Pink, SPEI   Chemistry      Component Value Date/Time   NA 140 07/17/2019 1040   NA 143 10/08/2017 1042   NA 141 06/16/2017 1014   K 3.4 (L) 07/17/2019 1040   K 3.4 10/08/2017 1042   K 3.8 06/16/2017 1014   CL 101 07/17/2019 1040   CL 100 10/08/2017 1042   CO2 30 07/17/2019 1040   CO2 30 10/08/2017 1042   CO2 31 (H) 06/16/2017 1014   BUN 12 07/17/2019 1040   BUN 10 10/08/2017 1042   BUN 8.9 06/16/2017 1014   CREATININE 0.87 07/17/2019 1040   CREATININE 0.9 10/08/2017 1042   CREATININE 0.8 06/16/2017 1014      Component Value Date/Time   CALCIUM 9.3 07/17/2019 1040   CALCIUM 9.5 10/08/2017 1042   CALCIUM 10.1 06/16/2017 1014   ALKPHOS 51 07/17/2019 1040   ALKPHOS 68 10/08/2017 1042   ALKPHOS 64 06/16/2017 1014   AST 16 07/17/2019 1040   AST 21 06/16/2017 1014   ALT 14 07/17/2019 1040   ALT 20 10/08/2017 1042   ALT 20 06/16/2017 1014   BILITOT 0.4 07/17/2019 1040   BILITOT 0.37 06/16/2017 1014      Impression and Plan: Ms. Walzer is a very pleasant 78 yo caucasian female with stage IIA ductal carcinoma of the left breast.  Her breast cancer is TRIPLE NEGATIVE.    She had her partial mastectomy in May 2018 followed by chemotherapy and radiation.  She completed chemotherapy in November 2018.  She then had radiation and completed radiation in January 2019.  Right now, I do not see any evidence of recurrent disease.  Everything looks quite well.  I think we get her back in 4 more months.  I think this would be reasonable.   We will make sure that she has her Port-A-Cath flushed every 2 months.  [name], MD 9/14/202012:03 PM

## 2019-07-17 NOTE — Patient Instructions (Signed)

## 2019-07-23 DIAGNOSIS — G4733 Obstructive sleep apnea (adult) (pediatric): Secondary | ICD-10-CM | POA: Diagnosis not present

## 2019-07-26 DIAGNOSIS — Z23 Encounter for immunization: Secondary | ICD-10-CM | POA: Diagnosis not present

## 2019-08-22 DIAGNOSIS — G4733 Obstructive sleep apnea (adult) (pediatric): Secondary | ICD-10-CM | POA: Diagnosis not present

## 2019-08-24 ENCOUNTER — Ambulatory Visit: Payer: Medicare Other | Admitting: Internal Medicine

## 2019-08-25 ENCOUNTER — Encounter: Payer: Self-pay | Admitting: Internal Medicine

## 2019-08-25 ENCOUNTER — Ambulatory Visit: Payer: Medicare Other | Admitting: Internal Medicine

## 2019-08-25 ENCOUNTER — Other Ambulatory Visit: Payer: Self-pay

## 2019-08-25 VITALS — BP 138/86 | HR 75 | Temp 97.2°F | Ht 63.0 in | Wt 178.0 lb

## 2019-08-25 DIAGNOSIS — G4701 Insomnia due to medical condition: Secondary | ICD-10-CM

## 2019-08-25 DIAGNOSIS — G4733 Obstructive sleep apnea (adult) (pediatric): Secondary | ICD-10-CM

## 2019-08-25 MED ORDER — TEMAZEPAM 15 MG PO CAPS: 15.0000 mg | ORAL_CAPSULE | Freq: Every evening | ORAL | 2 refills | Status: DC | PRN

## 2019-08-25 NOTE — Assessment & Plan Note (Signed)
She is having trouble being comfortable. Partly insomnia related to her stress issues, but she is not able to relax and work with CPAP effectively. Plan- refer to learn about oral appliance alternative

## 2019-08-25 NOTE — Patient Instructions (Addendum)
Script sent for temazepam    Try 1 capsule 15-20 minutes before bed to see if it helps you sleep.  Order- refer to Dr Oneal Grout, orthodontist   Consider oral appliance for OSA  Please call as needed

## 2019-08-25 NOTE — Progress Notes (Signed)
HPI F followed for OSA, complicated by  L breast CA/ partial mastectomy/XRT/chemo, HBP, GERD, Hypothyroid, ObesityROS HST 03/03/2019 AHI 69.7, desaturation to 81%, body weight 171 lbs --------------------------------------------------------------------  04/24/2019-  78 yo F followed for OSA, complicated by  L breast CA/ partial mastectomy/XRT/chemo, HBP, GERD, Hypothyroid, Obesity HST 03/03/2019 AHI 69.7, desaturation to 81%, body weight 171 lbs -----OSA on CPAP auto 5-16, DME: [employer]; pt states the pressure of her CPAP is uncomfortable for her eyes Body weight today- 176 lbs CPAP auto 5-16/ [employer] Download compliance 60%, AHI 7.5/ hr     Ranges 9.9- 14.4 Not sleeping well. Sonata gave "crazy dreams" and she changed to tylenol.  08/25/2019- 18 yo F followed for OSA, complicated by  L breast CA/ partial mastectomy/XRT/chemo, HBP, GERD, Hypothyroid, Obesity CPAP auto 5-16/ [employer] Download compliance 17%, AHI 1.5/ hr    Mostly short nights -----OSA on CPAP, DME: [employer]; pt reports restlessness d/t life events. Recently lost husband. Sonata 5 mg gave bad dreams., also has Ativan for nausea, Tylenol PM Discussed alternatives. Trazodone might interact with her antidepressant. Body weight today 178 lbs Nasal pillows/ chin strap, but can't get comfortable. Claustrophobic. Had gone for CPAP desensitization at sleep center previously. Had flu vax  ROS-see HPI   + = positive Constitutional:    weight loss, night sweats, fevers, chills, fatigue, lassitude. HEENT:    headaches, difficulty swallowing, tooth/dental problems, sore throat,       sneezing, itching, ear ache, nasal congestion, post nasal drip, snoring CV:    chest pain, orthopnea, PND, swelling in lower extremities, anasarca,                                  dizziness, palpitations Resp:   shortness of breath with exertion or at rest.                productive cough,   non-productive cough, coughing up of blood.              change in color of  mucus.  wheezing.   Skin:    rash or lesions. GI:  No-   heartburn, indigestion, abdominal pain, nausea, vomiting, diarrhea,                 change in bowel habits, loss of appetite GU: dysuria, change in color of urine, no urgency or frequency.   flank pain. MS:   joint pain, stiffness, decreased range of motion, back pain. Neuro-     nothing unusual Psych:  change in mood or affect.  +depression or anxiety.   memory loss.  OBJ- Physical Exam General- Alert, Oriented, Affect- quiet/ reserved, Distress- none acute,  + overweight Skin- rash-none, lesions- none, excoriation- none Lymphadenopathy- none Head- atraumatic            Eyes- Gross vision intact, PERRLA, conjunctivae and secretions clear            Ears- Hearing, canals-normal            Nose- Clear, no-Septal dev, mucus, polyps, erosion, perforation             Throat- Mallampati II-III , mucosa clear , drainage- none, tonsils- atrophic, own teeth Neck- flexible , trachea midline, no stridor , thyroid nl, carotid no bruit Chest - symmetrical excursion , unlabored           Heart/CV- RRR , no murmur , no gallop  , no  rub, nl s1 s2                           - JVD- none , edema- none, stasis changes- none, varices- none           Lung- clear to P&A, wheeze- none, cough- none , dullness-none, rub- none           Chest wall- + port R upper chest Abd-  Br/ Gen/ Rectal- Not done, not indicated Extrem- cyanosis- none, clubbing, none, atrophy- none, strength- nl Neuro- grossly intact to observation

## 2019-08-25 NOTE — Assessment & Plan Note (Signed)
Restless, fragmented sleep. Discussed need to protect her airway so a sleep med doesn't complicate her OSA. Plan- try temazepam with either CPAP or a fitted oral appliance.

## 2019-09-01 ENCOUNTER — Other Ambulatory Visit: Payer: Self-pay

## 2019-09-01 DIAGNOSIS — Z20822 Contact with and (suspected) exposure to covid-19: Secondary | ICD-10-CM

## 2019-09-02 LAB — NOVEL CORONAVIRUS, NAA: SARS-CoV-2, NAA: NOT DETECTED

## 2019-09-11 ENCOUNTER — Telehealth: Payer: Self-pay | Admitting: Internal Medicine

## 2019-09-11 NOTE — Telephone Encounter (Signed)
I will call pt's dtr.

## 2019-09-11 NOTE — Telephone Encounter (Signed)
Referral sent to Dr Ron Parker on 10/30 and they haven't heard from their office yet.  I gave daughter Dr Ron Parker' office # and she is going to call them.  Nothing further needed.

## 2019-09-18 ENCOUNTER — Other Ambulatory Visit: Payer: Self-pay

## 2019-09-18 ENCOUNTER — Inpatient Hospital Stay: Payer: Medicare Other | Attending: Hematology & Oncology

## 2019-09-18 ENCOUNTER — Other Ambulatory Visit: Payer: Self-pay | Admitting: Family

## 2019-09-18 VITALS — BP 152/73 | HR 81 | Temp 97.8°F | Resp 18

## 2019-09-18 DIAGNOSIS — Z86000 Personal history of in-situ neoplasm of breast: Secondary | ICD-10-CM | POA: Diagnosis not present

## 2019-09-18 DIAGNOSIS — Z923 Personal history of irradiation: Secondary | ICD-10-CM | POA: Insufficient documentation

## 2019-09-18 DIAGNOSIS — Z9221 Personal history of antineoplastic chemotherapy: Secondary | ICD-10-CM | POA: Insufficient documentation

## 2019-09-18 DIAGNOSIS — Z171 Estrogen receptor negative status [ER-]: Secondary | ICD-10-CM | POA: Diagnosis not present

## 2019-09-18 DIAGNOSIS — Z170 Estrogen receptor positive status [ER+]: Secondary | ICD-10-CM

## 2019-09-18 DIAGNOSIS — C50212 Malignant neoplasm of upper-inner quadrant of left female breast: Secondary | ICD-10-CM

## 2019-09-18 MED ORDER — HEPARIN SOD (PORK) LOCK FLUSH 10 UNIT/ML IV SOLN: 10.0000 [IU] | Freq: Once | INTRAVENOUS | Status: DC

## 2019-09-18 MED ORDER — HEPARIN SOD (PORK) LOCK FLUSH 100 UNIT/ML IV SOLN
500.0000 [IU] | Freq: Once | INTRAVENOUS | Status: AC
  Administered 2019-09-18: 500 [IU] via INTRAVENOUS
  Filled 2019-09-18: qty 5

## 2019-10-09 ENCOUNTER — Ambulatory Visit: Payer: Medicare Other | Admitting: Internal Medicine

## 2019-10-10 ENCOUNTER — Telehealth: Payer: Self-pay | Admitting: *Deleted

## 2019-10-10 NOTE — Telephone Encounter (Signed)
Call received from patient's daughter to obtain pt.'s last Vitamin D level.  Vitamin D level given to patient's daughter from 12/2018.  Pt.'s daughter appreciative of assistance and has no other questions or concerns at this time.

## 2019-11-13 ENCOUNTER — Other Ambulatory Visit: Payer: Self-pay

## 2019-11-13 ENCOUNTER — Inpatient Hospital Stay: Payer: Medicare Other

## 2019-11-13 ENCOUNTER — Encounter: Payer: Self-pay | Admitting: Hematology & Oncology

## 2019-11-13 ENCOUNTER — Inpatient Hospital Stay: Payer: Medicare Other | Attending: Hematology & Oncology | Admitting: Hematology & Oncology

## 2019-11-13 VITALS — HR 89 | Temp 98.6°F | Resp 18 | Wt 172.1 lb

## 2019-11-13 DIAGNOSIS — C50212 Malignant neoplasm of upper-inner quadrant of left female breast: Secondary | ICD-10-CM

## 2019-11-13 DIAGNOSIS — K2100 Gastro-esophageal reflux disease with esophagitis, without bleeding: Secondary | ICD-10-CM

## 2019-11-13 DIAGNOSIS — Z171 Estrogen receptor negative status [ER-]: Secondary | ICD-10-CM | POA: Insufficient documentation

## 2019-11-13 DIAGNOSIS — Z170 Estrogen receptor positive status [ER+]: Secondary | ICD-10-CM

## 2019-11-13 DIAGNOSIS — Z79899 Other long term (current) drug therapy: Secondary | ICD-10-CM | POA: Insufficient documentation

## 2019-11-13 DIAGNOSIS — F320 Major depressive disorder, single episode, mild: Secondary | ICD-10-CM

## 2019-11-13 DIAGNOSIS — Z86000 Personal history of in-situ neoplasm of breast: Secondary | ICD-10-CM | POA: Insufficient documentation

## 2019-11-13 DIAGNOSIS — F329 Major depressive disorder, single episode, unspecified: Secondary | ICD-10-CM | POA: Diagnosis not present

## 2019-11-13 DIAGNOSIS — E032 Hypothyroidism due to medicaments and other exogenous substances: Secondary | ICD-10-CM

## 2019-11-13 DIAGNOSIS — L03112 Cellulitis of left axilla: Secondary | ICD-10-CM | POA: Diagnosis not present

## 2019-11-13 DIAGNOSIS — G4733 Obstructive sleep apnea (adult) (pediatric): Secondary | ICD-10-CM

## 2019-11-13 DIAGNOSIS — Z452 Encounter for adjustment and management of vascular access device: Secondary | ICD-10-CM | POA: Diagnosis not present

## 2019-11-13 LAB — CBC WITH DIFFERENTIAL (CANCER CENTER ONLY)
Abs Immature Granulocytes: 0.02 10*3/uL (ref 0.00–0.07)
Basophils Absolute: 0 10*3/uL (ref 0.0–0.1)
Basophils Relative: 0 %
Eosinophils Absolute: 0 10*3/uL (ref 0.0–0.5)
Eosinophils Relative: 1 %
HCT: 37.2 % (ref 36.0–46.0)
Hemoglobin: 12.3 g/dL (ref 12.0–15.0)
Immature Granulocytes: 0 %
Lymphocytes Relative: 19 %
Lymphs Abs: 1.2 10*3/uL (ref 0.7–4.0)
MCH: 32.7 pg (ref 26.0–34.0)
MCHC: 33.1 g/dL (ref 30.0–36.0)
MCV: 98.9 fL (ref 80.0–100.0)
Monocytes Absolute: 0.4 10*3/uL (ref 0.1–1.0)
Monocytes Relative: 6 %
Neutro Abs: 4.4 10*3/uL (ref 1.7–7.7)
Neutrophils Relative %: 74 %
Platelet Count: 264 10*3/uL (ref 150–400)
RBC: 3.76 MIL/uL — ABNORMAL LOW (ref 3.87–5.11)
RDW: 12.7 % (ref 11.5–15.5)
WBC Count: 6.1 10*3/uL (ref 4.0–10.5)
nRBC: 0 % (ref 0.0–0.2)

## 2019-11-13 LAB — CMP (CANCER CENTER ONLY)
ALT: 14 U/L (ref 0–44)
AST: 14 U/L — ABNORMAL LOW (ref 15–41)
Albumin: 4.2 g/dL (ref 3.5–5.0)
Alkaline Phosphatase: 60 U/L (ref 38–126)
Anion gap: 6 (ref 5–15)
BUN: 13 mg/dL (ref 8–23)
CO2: 32 mmol/L (ref 22–32)
Calcium: 9.8 mg/dL (ref 8.9–10.3)
Chloride: 101 mmol/L (ref 98–111)
Creatinine: 0.77 mg/dL (ref 0.44–1.00)
GFR, Est AFR Am: >60 mL/min (ref >60)
GFR, Estimated: >60 mL/min (ref >60)
Glucose, Bld: 137 mg/dL — ABNORMAL HIGH (ref 70–99)
Potassium: 3.6 mmol/L (ref 3.5–5.1)
Sodium: 139 mmol/L (ref 135–145)
Total Bilirubin: 0.3 mg/dL (ref 0.3–1.2)
Total Protein: 6.7 g/dL (ref 6.5–8.1)

## 2019-11-13 MED ORDER — SODIUM CHLORIDE 0.9% FLUSH
10.0000 mL | INTRAVENOUS | Status: DC | PRN
  Administered 2019-11-13: 10 mL via INTRAVENOUS
  Filled 2019-11-13: qty 10

## 2019-11-13 MED ORDER — HEPARIN SOD (PORK) LOCK FLUSH 100 UNIT/ML IV SOLN
500.0000 [IU] | Freq: Once | INTRAVENOUS | Status: AC
  Administered 2019-11-13: 500 [IU] via INTRAVENOUS
  Filled 2019-11-13: qty 5

## 2019-11-13 NOTE — Patient Instructions (Signed)

## 2019-11-13 NOTE — Progress Notes (Signed)
Hematology and Oncology Follow Up Visit  CADESHA RANDLETT [id number] [birth year] 79 y.o. 11/13/2019   Principle Diagnosis:  Stage IIA (T2bN0M0) invasive ductal ca of the LEFT breast - TRIPLE NEGATIVE Staphylococcus aureus wound infection of the LEFT axilla Cellulitis of the left breast  Past Therapy:   Taxotere/Carboplatin - s/p cycle #4 -completed adjuvant therapy on 09/09/2017 Radiation therapy to the right breast.-Completed 4005 rad with an additional 1000 rad boost on 11/11/2017  Current Therapy: Observation   Interim History:  Ms. Bielen is here today for follow-up.  She is doing okay although depression I think is a problem for her.  We really need to get her into see a counselor.  I think we will see about referring her to [facility] behavioral [facility].  I think that they have the right person who can help her out.  She is not using her CPAP at all.  She just does not like using the CPAP device.  She has had no problems with nausea or vomiting.  She and her family had a quiet holiday season.  She has had no problems with change in bowel or bladder habits.  She has had no cough.  There is been no fever.  She has had no leg swelling.  It is still difficult with respect to her husband passing on early in the year.  I know that the holidays were a little bit tough for her.  She still has a Port-A-Cath in.  We are flushing this every 6-8 weeks.  She really does need to exercise a bit more if possible.  Overall, I would say that her performance status is ECOG 1. Anthony weeks #2 Overall, her performance status is ECOG 1.   Medications:  Allergies as of 11/13/2019      Reactions   Advil [ibuprofen] Hives, Itching   Codeine Hives   Penicillins Hives   Has patient had a PCN reaction causing immediate rash, facial/tongue/throat swelling, SOB or lightheadedness with hypotension: yes Has patient had a PCN reaction causing severe rash involving mucus membranes or skin  necrosis:no Has patient had a PCN reaction that required hospitalization:no Has patient had a PCN reaction occurring within the last 10 years: no If all of the above answers are "NO", then may proceed with Cephalosporin use. Has patient had a PCN reaction causing immediate rash, facial/tongue/throat swelling, SOB or lightheadedness with hypotension: yes Has patient had a PCN reaction causing severe rash involving mucus membranes or skin necrosis:  no Has patient had a PCN reaction that required hospitalization:  no Has patient had a PCN reaction occurring within the last 10 years: no If all of the above answers are "NO", then may proceed with Cephalosporin use.   Band-aid Plus Antibiotic [bacitracin-polymyxin B] Dermatitis   Benazepril Swelling   Possible cause of tongue swelling   Other Rash   Band-aid      Medication List       Accurate as of November 13, 2019  1:33 PM. If you have any questions, ask your nurse or doctor.        buPROPion 150 MG 24 hr tablet Commonly known as: WELLBUTRIN XL Take 150 mg by mouth every morning.   diphenhydramine-acetaminophen 25-500 MG Tabs tablet Commonly known as: TYLENOL PM Take 1 tablet by mouth at bedtime as needed (sleep).   Generlac 10 GM/15ML Soln Generic drug: lactulose (encephalopathy) TAKE 30 ML BY MOUTH EVERY 4 HOURS AS NEEDED FOR MILD CONSTIPATION UNTIL BOWEL MOVEMENT   hyaluronate sodium  Gel Apply 1 application topically once.   hydrochlorothiazide 12.5 MG capsule Commonly known as: MICROZIDE Take 12.5 mg by mouth daily.   levothyroxine 75 MCG tablet Commonly known as: SYNTHROID Take 75 mcg by mouth daily.   lidocaine-prilocaine cream Commonly known as: EMLA Apply 1 application topically as needed.   LORazepam 0.5 MG tablet Commonly known as: Ativan Take 1 tablet (0.5 mg total) by mouth every 6 (six) hours as needed (Nausea or vomiting).   Lysine 1000 MG Tabs Take 1,000 mg by mouth as needed.   multivitamin  tablet Take 1 tablet by mouth daily.   nitrofurantoin (macrocrystal-monohydrate) 100 MG capsule Commonly known as: MACROBID Take 100 mg by mouth QID.   omeprazole 20 MG capsule Commonly known as: PRILOSEC   ondansetron 8 MG tablet Commonly known as: Zofran Take 1 tablet (8 mg total) by mouth 2 (two) times daily as needed for refractory nausea / vomiting. Start on day 3 after chemo.   OVER THE COUNTER MEDICATION Take 15 mLs by mouth 2 (two) times daily as needed (pain).   polyvinyl alcohol 1.4 % ophthalmic solution Commonly known as: LIQUIFILM TEARS 1 drop as needed for dry eyes.   potassium chloride SA 20 MEQ tablet Commonly known as: KLOR-CON Take 2 tablets (40 mEq total) daily by mouth.   prochlorperazine 10 MG tablet Commonly known as: COMPAZINE TAKE 1 TABLET(10 MG) BY MOUTH EVERY 6 HOURS AS NEEDED FOR NAUSEA OR VOMITING   temazepam 15 MG capsule Commonly known as: RESTORIL Take 1 capsule (15 mg total) by mouth at bedtime as needed for sleep.   venlafaxine XR 75 MG 24 hr capsule Commonly known as: EFFEXOR-XR Take 225 mg by mouth daily.   zaleplon 5 MG capsule Commonly known as: Sonata Take 1 capsule (5 mg total) by mouth at bedtime as needed for sleep.       Allergies:  Allergies  Allergen Reactions  . Advil [Ibuprofen] Hives and Itching  . Codeine Hives  . Penicillins Hives    Has patient had a PCN reaction causing immediate rash, facial/tongue/throat swelling, SOB or lightheadedness with hypotension: yes Has patient had a PCN reaction causing severe rash involving mucus membranes or skin necrosis:no Has patient had a PCN reaction that required hospitalization:no Has patient had a PCN reaction occurring within the last 10 years: no If all of the above answers are "NO", then may proceed with Cephalosporin use. Has patient had a PCN reaction causing immediate rash, facial/tongue/throat swelling, SOB or lightheadedness with hypotension: yes Has patient had  a PCN reaction causing severe rash involving mucus membranes or skin necrosis:  no Has patient had a PCN reaction that required hospitalization:  no Has patient had a PCN reaction occurring within the last 10 years: no If all of the above answers are "NO", then may proceed with Cephalosporin use.   . Band-Aid Plus Antibiotic [Bacitracin-Polymyxin B] Dermatitis  . Benazepril Swelling    Possible cause of tongue swelling  . Other Rash    Band-aid    Past Medical History, Surgical history, Social history, and Family History were reviewed and updated.  Review of Systems: Review of Systems  Constitutional: Negative.   HENT: Negative.   Eyes: Negative.   Respiratory: Negative.   Cardiovascular: Negative.   Gastrointestinal: Negative.   Genitourinary: Negative.   Musculoskeletal: Negative.   Skin: Negative.   Neurological: Negative.   Endo/Heme/Allergies: Negative.   Psychiatric/Behavioral: Negative.      Physical Exam:  vitals were not taken for  this visit.   Wt Readings from Last 3 Encounters:  11/13/19 172 lb 1.9 oz (78.1 kg)  08/25/19 178 lb (80.7 kg)  07/17/19 179 lb (81.2 kg)    Physical Exam Vitals reviewed.  HENT:     Head: Normocephalic and atraumatic.  Eyes:     Pupils: Pupils are equal, round, and reactive to light.  Cardiovascular:     Rate and Rhythm: Normal rate and regular rhythm.     Heart sounds: Normal heart sounds.  Pulmonary:     Effort: Pulmonary effort is normal.     Breath sounds: Normal breath sounds.  Abdominal:     General: Bowel sounds are normal.     Palpations: Abdomen is soft.  Musculoskeletal:        General: No tenderness or deformity. Normal range of motion.     Cervical back: Normal range of motion.  Lymphadenopathy:     Cervical: No cervical adenopathy.  Skin:    General: Skin is warm and dry.     Findings: No erythema or rash.  Neurological:     Mental Status: She is alert and oriented to person, place, and time.   Psychiatric:        Behavior: Behavior normal.        Thought Content: Thought content normal.        Judgment: Judgment normal.      Lab Results  Component Value Date   WBC 6.1 11/13/2019   HGB 12.3 11/13/2019   HCT 37.2 11/13/2019   MCV 98.9 11/13/2019   PLT 264 11/13/2019   Lab Results  Component Value Date   FERRITIN 6 (L) 12/14/2018   IRON 141 12/14/2018   TIBC 396 12/14/2018   UIBC 256 12/14/2018   IRONPCTSAT 35 12/14/2018   Lab Results  Component Value Date   RBC 3.76 (L) 11/13/2019   No results found for: KPAFRELGTCHN, LAMBDASER, KAPLAMBRATIO No results found for: IGGSERUM, IGA, IGMSERUM No results found for: Odetta Pink, SPEI   Chemistry      Component Value Date/Time   NA 139 11/13/2019 1208   NA 143 10/08/2017 1042   NA 141 06/16/2017 1014   K 3.6 11/13/2019 1208   K 3.4 10/08/2017 1042   K 3.8 06/16/2017 1014   CL 101 11/13/2019 1208   CL 100 10/08/2017 1042   CO2 32 11/13/2019 1208   CO2 30 10/08/2017 1042   CO2 31 (H) 06/16/2017 1014   BUN 13 11/13/2019 1208   BUN 10 10/08/2017 1042   BUN 8.9 06/16/2017 1014   CREATININE 0.77 11/13/2019 1208   CREATININE 0.9 10/08/2017 1042   CREATININE 0.8 06/16/2017 1014      Component Value Date/Time   CALCIUM 9.8 11/13/2019 1208   CALCIUM 9.5 10/08/2017 1042   CALCIUM 10.1 06/16/2017 1014   ALKPHOS 60 11/13/2019 1208   ALKPHOS 68 10/08/2017 1042   ALKPHOS 64 06/16/2017 1014   AST 14 (L) 11/13/2019 1208   AST 21 06/16/2017 1014   ALT 14 11/13/2019 1208   ALT 20 10/08/2017 1042   ALT 20 06/16/2017 1014   BILITOT 0.3 11/13/2019 1208   BILITOT 0.37 06/16/2017 1014      Impression and Plan: Ms. Cabal is a very pleasant 79 yo caucasian female with stage IIA ductal carcinoma of the left breast.  Her breast cancer is TRIPLE NEGATIVE.    She had her partial mastectomy in May 2018 followed by chemotherapy and radiation.  She completed chemotherapy  in November 2018.  She then had radiation and completed radiation in January 2019.  Right now, I do not see any evidence of recurrent disease.  Everything looks quite well.  Hopefully, she will do a little better with the depression.  We will plan to get her back in the springtime.  We will have to flush her Port-A-Cath.   [name], MD 1/11/20211:33 PM

## 2019-11-14 DIAGNOSIS — G4733 Obstructive sleep apnea (adult) (pediatric): Secondary | ICD-10-CM | POA: Diagnosis not present

## 2019-11-14 DIAGNOSIS — B370 Candidal stomatitis: Secondary | ICD-10-CM | POA: Diagnosis not present

## 2019-11-14 DIAGNOSIS — L918 Other hypertrophic disorders of the skin: Secondary | ICD-10-CM | POA: Diagnosis not present

## 2019-11-14 DIAGNOSIS — R251 Tremor, unspecified: Secondary | ICD-10-CM | POA: Diagnosis not present

## 2019-11-14 DIAGNOSIS — D508 Other iron deficiency anemias: Secondary | ICD-10-CM | POA: Diagnosis not present

## 2019-11-14 DIAGNOSIS — E876 Hypokalemia: Secondary | ICD-10-CM | POA: Diagnosis not present

## 2019-11-14 LAB — IRON AND TIBC
Iron: 56 ug/dL (ref 41–142)
Saturation Ratios: 15 % — ABNORMAL LOW (ref 21–57)
TIBC: 374 ug/dL (ref 236–444)
UIBC: 318 ug/dL (ref 120–384)

## 2019-11-14 LAB — FERRITIN: Ferritin: 5 ng/mL — ABNORMAL LOW (ref 11–307)

## 2019-11-14 LAB — TSH: TSH: 1.049 u[IU]/mL (ref 0.308–3.960)

## 2019-11-21 ENCOUNTER — Other Ambulatory Visit: Payer: Self-pay | Admitting: Family

## 2019-11-22 ENCOUNTER — Inpatient Hospital Stay: Payer: Medicare Other

## 2019-11-22 ENCOUNTER — Other Ambulatory Visit: Payer: Self-pay

## 2019-11-22 VITALS — BP 154/63

## 2019-11-22 DIAGNOSIS — Z86000 Personal history of in-situ neoplasm of breast: Secondary | ICD-10-CM | POA: Diagnosis not present

## 2019-11-22 DIAGNOSIS — Z452 Encounter for adjustment and management of vascular access device: Secondary | ICD-10-CM | POA: Diagnosis not present

## 2019-11-22 DIAGNOSIS — C50212 Malignant neoplasm of upper-inner quadrant of left female breast: Secondary | ICD-10-CM

## 2019-11-22 DIAGNOSIS — Z79899 Other long term (current) drug therapy: Secondary | ICD-10-CM | POA: Diagnosis not present

## 2019-11-22 DIAGNOSIS — L03112 Cellulitis of left axilla: Secondary | ICD-10-CM | POA: Diagnosis not present

## 2019-11-22 DIAGNOSIS — Z171 Estrogen receptor negative status [ER-]: Secondary | ICD-10-CM | POA: Diagnosis not present

## 2019-11-22 DIAGNOSIS — Z170 Estrogen receptor positive status [ER+]: Secondary | ICD-10-CM

## 2019-11-22 MED ORDER — HEPARIN SOD (PORK) LOCK FLUSH 100 UNIT/ML IV SOLN
500.0000 [IU] | Freq: Once | INTRAVENOUS | Status: AC | PRN
  Administered 2019-11-22: 500 [IU]
  Filled 2019-11-22: qty 5

## 2019-11-22 MED ORDER — SODIUM CHLORIDE 0.9 % IV SOLN
200.0000 mg | Freq: Once | INTRAVENOUS | Status: AC
  Administered 2019-11-22: 200 mg via INTRAVENOUS
  Filled 2019-11-22: qty 200

## 2019-11-22 MED ORDER — SODIUM CHLORIDE 0.9 % IV SOLN
Freq: Once | INTRAVENOUS | Status: AC
  Filled 2019-11-22: qty 250

## 2019-11-22 MED ORDER — ALTEPLASE 2 MG IJ SOLR
2.0000 mg | Freq: Once | INTRAMUSCULAR | Status: DC | PRN
  Filled 2019-11-22: qty 2

## 2019-11-27 ENCOUNTER — Ambulatory Visit: Payer: Medicare Other | Attending: Internal Medicine

## 2019-11-27 ENCOUNTER — Other Ambulatory Visit: Payer: Self-pay | Admitting: Family

## 2019-11-27 DIAGNOSIS — Z23 Encounter for immunization: Secondary | ICD-10-CM | POA: Insufficient documentation

## 2019-11-27 NOTE — Progress Notes (Signed)
   Covid-19 Vaccination Clinic  Name:  Jessica [name]    MRN: [id number] DOB: [birth year]  11/27/2019  [name] was observed post Covid-19 immunization for 15 minutes without incidence. She was provided with Vaccine Information Sheet and instruction to access the V-Safe system.   [name] was instructed to call 911 with any severe reactions post vaccine: Marland Kitchen Difficulty breathing  . Swelling of your face and throat  . A fast heartbeat  . A bad rash all over your body  . Dizziness and weakness    Immunizations Administered    Name Date Dose VIS Date Route   Pfizer COVID-19 Vaccine 11/27/2019 10:16 AM 0.3 mL 10/13/2019 Intramuscular   Manufacturer: [employer]   Lot: [id number]   NDC: [id number]

## 2019-11-28 ENCOUNTER — Telehealth: Payer: Self-pay | Admitting: *Deleted

## 2019-11-28 NOTE — Telephone Encounter (Signed)
Call placed to patient's daughter to notify her that Dr. Marin Olp has spoken with Dr. Ouida Sills this morning and that Dr. Tonette Bihari office is going to reach out to patient for appt.  [name] appreciative of call back.

## 2019-11-29 ENCOUNTER — Inpatient Hospital Stay: Payer: Medicare Other

## 2019-11-29 ENCOUNTER — Other Ambulatory Visit: Payer: Self-pay

## 2019-11-29 VITALS — BP 136/69 | HR 70 | Resp 16

## 2019-11-29 DIAGNOSIS — Z79899 Other long term (current) drug therapy: Secondary | ICD-10-CM | POA: Diagnosis not present

## 2019-11-29 DIAGNOSIS — Z452 Encounter for adjustment and management of vascular access device: Secondary | ICD-10-CM | POA: Diagnosis not present

## 2019-11-29 DIAGNOSIS — Z171 Estrogen receptor negative status [ER-]: Secondary | ICD-10-CM | POA: Diagnosis not present

## 2019-11-29 DIAGNOSIS — Z86000 Personal history of in-situ neoplasm of breast: Secondary | ICD-10-CM | POA: Diagnosis not present

## 2019-11-29 DIAGNOSIS — Z170 Estrogen receptor positive status [ER+]: Secondary | ICD-10-CM

## 2019-11-29 DIAGNOSIS — L03112 Cellulitis of left axilla: Secondary | ICD-10-CM | POA: Diagnosis not present

## 2019-11-29 DIAGNOSIS — C50212 Malignant neoplasm of upper-inner quadrant of left female breast: Secondary | ICD-10-CM

## 2019-11-29 MED ORDER — SODIUM CHLORIDE 0.9 % IV SOLN
200.0000 mg | Freq: Once | INTRAVENOUS | Status: AC
  Administered 2019-11-29: 200 mg via INTRAVENOUS
  Filled 2019-11-29: qty 200

## 2019-11-29 MED ORDER — SODIUM CHLORIDE 0.9 % IV SOLN
Freq: Once | INTRAVENOUS | Status: AC
  Filled 2019-11-29: qty 250

## 2019-11-29 MED ORDER — SODIUM CHLORIDE 0.9% FLUSH
10.0000 mL | INTRAVENOUS | Status: DC | PRN
  Administered 2019-11-29: 10 mL
  Filled 2019-11-29: qty 10

## 2019-11-29 MED ORDER — HEPARIN SOD (PORK) LOCK FLUSH 100 UNIT/ML IV SOLN
500.0000 [IU] | Freq: Once | INTRAVENOUS | Status: AC | PRN
  Administered 2019-11-29: 11:00:00 500 [IU]
  Filled 2019-11-29: qty 5

## 2019-11-29 NOTE — Patient Instructions (Signed)

## 2019-12-04 ENCOUNTER — Other Ambulatory Visit: Payer: Self-pay | Admitting: Family

## 2019-12-05 ENCOUNTER — Ambulatory Visit (INDEPENDENT_AMBULATORY_CARE_PROVIDER_SITE_OTHER): Payer: Medicare Other | Admitting: Psychiatry

## 2019-12-05 DIAGNOSIS — F4323 Adjustment disorder with mixed anxiety and depressed mood: Secondary | ICD-10-CM

## 2019-12-12 ENCOUNTER — Ambulatory Visit (INDEPENDENT_AMBULATORY_CARE_PROVIDER_SITE_OTHER): Payer: Medicare Other | Admitting: Psychiatry

## 2019-12-12 DIAGNOSIS — F4323 Adjustment disorder with mixed anxiety and depressed mood: Secondary | ICD-10-CM

## 2019-12-18 ENCOUNTER — Ambulatory Visit: Payer: Medicare Other | Attending: Internal Medicine

## 2019-12-18 DIAGNOSIS — Z23 Encounter for immunization: Secondary | ICD-10-CM | POA: Insufficient documentation

## 2019-12-18 NOTE — Progress Notes (Signed)
   Covid-19 Vaccination Clinic  Name:  Jessica [name]    MRN: [id number] DOB: 1941/05/18  12/18/2019  Jessica [name] was observed post Covid-19 immunization for 15 minutes without incidence. She was provided with Vaccine Information Sheet and instruction to access the V-Safe system.   Jessica [name] was instructed to call 911 with any severe reactions post vaccine: Marland Kitchen Difficulty breathing  . Swelling of your face and throat  . A fast heartbeat  . A bad rash all over your body  . Dizziness and weakness    Immunizations Administered    Name Date Dose VIS Date Route   Pfizer COVID-19 Vaccine 12/18/2019 10:59 AM 0.3 mL 10/13/2019 Intramuscular   Manufacturer: [place]   Lot: [id number]   [place]: [id number]

## 2019-12-19 ENCOUNTER — Ambulatory Visit (INDEPENDENT_AMBULATORY_CARE_PROVIDER_SITE_OTHER): Payer: Medicare Other | Admitting: Psychiatry

## 2019-12-19 DIAGNOSIS — F4323 Adjustment disorder with mixed anxiety and depressed mood: Secondary | ICD-10-CM

## 2019-12-27 ENCOUNTER — Ambulatory Visit: Payer: Medicare Other | Admitting: Internal Medicine

## 2019-12-27 DIAGNOSIS — I10 Essential (primary) hypertension: Secondary | ICD-10-CM | POA: Diagnosis not present

## 2019-12-27 DIAGNOSIS — E559 Vitamin D deficiency, unspecified: Secondary | ICD-10-CM | POA: Diagnosis not present

## 2019-12-27 DIAGNOSIS — E7800 Pure hypercholesterolemia, unspecified: Secondary | ICD-10-CM | POA: Diagnosis not present

## 2020-01-01 DIAGNOSIS — K219 Gastro-esophageal reflux disease without esophagitis: Secondary | ICD-10-CM | POA: Diagnosis not present

## 2020-01-01 DIAGNOSIS — C50212 Malignant neoplasm of upper-inner quadrant of left female breast: Secondary | ICD-10-CM | POA: Diagnosis not present

## 2020-01-01 DIAGNOSIS — I10 Essential (primary) hypertension: Secondary | ICD-10-CM | POA: Diagnosis not present

## 2020-01-01 DIAGNOSIS — Z0000 Encounter for general adult medical examination without abnormal findings: Secondary | ICD-10-CM | POA: Diagnosis not present

## 2020-01-01 DIAGNOSIS — E039 Hypothyroidism, unspecified: Secondary | ICD-10-CM | POA: Diagnosis not present

## 2020-01-09 ENCOUNTER — Other Ambulatory Visit: Payer: Self-pay | Admitting: *Deleted

## 2020-01-09 DIAGNOSIS — Z171 Estrogen receptor negative status [ER-]: Secondary | ICD-10-CM

## 2020-01-09 DIAGNOSIS — C50212 Malignant neoplasm of upper-inner quadrant of left female breast: Secondary | ICD-10-CM

## 2020-01-09 MED ORDER — LIDOCAINE-PRILOCAINE 2.5-2.5 % EX CREA: 1.0000 "application " | TOPICAL_CREAM | CUTANEOUS | 6 refills | Status: DC | PRN

## 2020-01-11 ENCOUNTER — Other Ambulatory Visit: Payer: Self-pay

## 2020-01-11 ENCOUNTER — Inpatient Hospital Stay: Payer: Medicare Other | Attending: Hematology & Oncology

## 2020-01-11 DIAGNOSIS — Z171 Estrogen receptor negative status [ER-]: Secondary | ICD-10-CM | POA: Diagnosis not present

## 2020-01-11 DIAGNOSIS — Z452 Encounter for adjustment and management of vascular access device: Secondary | ICD-10-CM | POA: Diagnosis not present

## 2020-01-11 DIAGNOSIS — Z86000 Personal history of in-situ neoplasm of breast: Secondary | ICD-10-CM | POA: Diagnosis not present

## 2020-01-11 DIAGNOSIS — Z95828 Presence of other vascular implants and grafts: Secondary | ICD-10-CM

## 2020-01-11 DIAGNOSIS — Z923 Personal history of irradiation: Secondary | ICD-10-CM | POA: Diagnosis not present

## 2020-01-11 DIAGNOSIS — Z9221 Personal history of antineoplastic chemotherapy: Secondary | ICD-10-CM | POA: Insufficient documentation

## 2020-01-11 MED ORDER — SODIUM CHLORIDE 0.9 % IV SOLN
Freq: Once | INTRAVENOUS | Status: DC
  Filled 2020-01-11: qty 250

## 2020-01-11 MED ORDER — HEPARIN SOD (PORK) LOCK FLUSH 100 UNIT/ML IV SOLN
500.0000 [IU] | Freq: Once | INTRAVENOUS | Status: AC
  Administered 2020-01-11: 500 [IU] via INTRAVENOUS
  Filled 2020-01-11: qty 5

## 2020-01-11 MED ORDER — SODIUM CHLORIDE 0.9% FLUSH
10.0000 mL | Freq: Once | INTRAVENOUS | Status: AC
  Administered 2020-01-11: 13:00:00 10 mL via INTRAVENOUS
  Filled 2020-01-11: qty 10

## 2020-01-11 NOTE — Patient Instructions (Signed)
Tunneled Central Venous Catheter Flushing Guide  It is important to flush your tunneled central venous catheter each time you use it, both before and after you use it. Flushing your catheter will help prevent it from clogging. What are the risks? Risks may include:  Infection.  Air getting into the catheter and bloodstream. Supplies needed:  A clean pair of gloves.  A disinfecting wipe. Use an alcohol wipe, chlorhexidine wipe, or iodine wipe as told by your health care provider.  A 10 mL syringe that has been prefilled with saline solution.  An empty 10 mL syringe, if a substance called heparin was injected into your catheter. How to flush your catheter When you flush your catheter, make sure you follow any specific instructions from your health care provider or the manufacturer. These are general guidelines. Flushing your catheter before use If there is heparin in your catheter: 1. Wash your hands with soap and water. 2. Put on gloves. 3. Scrub the injection cap for a minimum of 15 seconds with a disinfecting wipe. 4. Unclamp the catheter. 5. Attach the empty syringe to the injection cap. 6. Pull the syringe plunger back and withdraw 10 mL of blood. 7. Place the syringe into an appropriate waste container. 8. Scrub the injection cap for 15 seconds with a disinfecting wipe. 9. Attach the prefilled syringe to the injection cap. 10. Flush the catheter by pushing the plunger forward until all the liquid from the syringe is in the catheter. 11. Remove the syringe from the injection cap. 12. Clamp the catheter. If there is no heparin in your catheter: 1. Wash your hands with soap and water. 2. Put on gloves. 3. Scrub the injection cap for 15 seconds with a disinfecting wipe. 4. Unclamp the catheter. 5. Attach the prefilled syringe to the injection cap. 6. Flush the catheter by pushing the plunger forward until 5 mL of the liquid from the syringe is in the catheter. 7. Pull back on  the syringe until you see blood in the catheter. 8. If you have been asked to collect any blood, follow your health care provider's instructions. Otherwise, flush the catheter with the rest of the solution from the syringe. 9. Remove the syringe from the injection cap. 10. Clamp the catheter.  Flushing your catheter after use 1. Wash your hands with soap and water. 2. Put on gloves. 3. Scrub the injection cap for 15 seconds with a disinfecting wipe. 4. Unclamp the catheter. 5. Attach the prefilled syringe to the injection cap. 6. Flush the catheter by pushing the plunger forward until all of the liquid from the syringe is in the catheter. 7. Remove the syringe from the injection cap. 8. Clamp the catheter. Problems and solutions  If blood cannot be completely cleared from the injection cap, you may need to have the injection cap replaced.  If the catheter is difficult to flush, use the pulsing method. The pulsing method involves pushing only a few milliliters of solution into the catheter at a time and pausing between pushes.  If you do not see blood in the catheter when you pull back on the syringe, change your body position, such as by raising your arms above your head. Take a deep breath and cough. Then, pull back on the syringe. If you still do not see blood, flush the catheter with a small amount of solution. Then, change positions again and take a breath or cough. Pull back on the syringe again. If you still do not see   blood, finish flushing the catheter and contact your health care provider. Do not use your catheter until your health care provider says it is okay. General tips  Have someone help you flush your catheter, if possible.  Do not force fluid through your catheter.  Do not use a syringe that is larger or smaller than 10 mL. Using a smaller syringe can make the catheter burst.  Do not use your catheter without flushing it first if it has heparin in it. Contact a health  care provider if:  You cannot see any blood in the catheter when you flush it before using it.  Your catheter is difficult to flush. Get help right away if:  You cannot flush the catheter.  The catheter leaks when you flush it or when there is fluid in it.  There are cracks or breaks in the catheter. Summary  It is important to flush your tunneled central venous catheter each time you use it, both before and after you use it.  Scrub the injection cap for 15 seconds with a disinfecting wipe before and after you flush it.  When you flush your catheter, make sure you follow any specific instructions from your health care provider or the manufacturer.  Get help right away if you cannot flush the catheter. This information is not intended to replace advice given to you by your health care provider. Make sure you discuss any questions you have with your health care provider. Document Revised: 07/14/2019 Document Reviewed: 01/04/2019 Elsevier Patient Education  2020 [employer] Inc[employer]  

## 2020-01-15 DIAGNOSIS — H179 Unspecified corneal scar and opacity: Secondary | ICD-10-CM | POA: Diagnosis not present

## 2020-01-15 DIAGNOSIS — H04123 Dry eye syndrome of bilateral lacrimal glands: Secondary | ICD-10-CM | POA: Diagnosis not present

## 2020-01-15 DIAGNOSIS — H35361 Drusen (degenerative) of macula, right eye: Secondary | ICD-10-CM | POA: Diagnosis not present

## 2020-01-15 DIAGNOSIS — Z961 Presence of intraocular lens: Secondary | ICD-10-CM | POA: Diagnosis not present

## 2020-01-16 DIAGNOSIS — L821 Other seborrheic keratosis: Secondary | ICD-10-CM | POA: Diagnosis not present

## 2020-01-16 DIAGNOSIS — L820 Inflamed seborrheic keratosis: Secondary | ICD-10-CM | POA: Diagnosis not present

## 2020-01-31 ENCOUNTER — Encounter: Payer: Self-pay | Admitting: Internal Medicine

## 2020-02-09 ENCOUNTER — Other Ambulatory Visit: Payer: Self-pay | Admitting: Hematology & Oncology

## 2020-02-09 DIAGNOSIS — Z9889 Other specified postprocedural states: Secondary | ICD-10-CM

## 2020-03-14 ENCOUNTER — Other Ambulatory Visit: Payer: Self-pay

## 2020-03-14 ENCOUNTER — Inpatient Hospital Stay: Payer: Medicare Other

## 2020-03-14 ENCOUNTER — Encounter: Payer: Self-pay | Admitting: Hematology & Oncology

## 2020-03-14 ENCOUNTER — Inpatient Hospital Stay (HOSPITAL_BASED_OUTPATIENT_CLINIC_OR_DEPARTMENT_OTHER): Payer: Medicare Other | Admitting: Hematology & Oncology

## 2020-03-14 ENCOUNTER — Inpatient Hospital Stay: Payer: Medicare Other | Attending: Hematology & Oncology

## 2020-03-14 VITALS — Wt 174.0 lb

## 2020-03-14 VITALS — BP 158/75 | HR 82 | Temp 97.7°F

## 2020-03-14 DIAGNOSIS — L03112 Cellulitis of left axilla: Secondary | ICD-10-CM | POA: Diagnosis not present

## 2020-03-14 DIAGNOSIS — Z170 Estrogen receptor positive status [ER+]: Secondary | ICD-10-CM

## 2020-03-14 DIAGNOSIS — Z171 Estrogen receptor negative status [ER-]: Secondary | ICD-10-CM | POA: Insufficient documentation

## 2020-03-14 DIAGNOSIS — N61 Mastitis without abscess: Secondary | ICD-10-CM | POA: Diagnosis not present

## 2020-03-14 DIAGNOSIS — Z86000 Personal history of in-situ neoplasm of breast: Secondary | ICD-10-CM | POA: Insufficient documentation

## 2020-03-14 DIAGNOSIS — Z862 Personal history of diseases of the blood and blood-forming organs and certain disorders involving the immune mechanism: Secondary | ICD-10-CM | POA: Diagnosis not present

## 2020-03-14 DIAGNOSIS — Z95828 Presence of other vascular implants and grafts: Secondary | ICD-10-CM

## 2020-03-14 DIAGNOSIS — C50212 Malignant neoplasm of upper-inner quadrant of left female breast: Secondary | ICD-10-CM | POA: Diagnosis not present

## 2020-03-14 DIAGNOSIS — Z9221 Personal history of antineoplastic chemotherapy: Secondary | ICD-10-CM | POA: Diagnosis not present

## 2020-03-14 DIAGNOSIS — Z79899 Other long term (current) drug therapy: Secondary | ICD-10-CM | POA: Diagnosis not present

## 2020-03-14 DIAGNOSIS — F320 Major depressive disorder, single episode, mild: Secondary | ICD-10-CM

## 2020-03-14 DIAGNOSIS — K5900 Constipation, unspecified: Secondary | ICD-10-CM | POA: Diagnosis not present

## 2020-03-14 DIAGNOSIS — Z923 Personal history of irradiation: Secondary | ICD-10-CM | POA: Diagnosis not present

## 2020-03-14 LAB — CBC WITH DIFFERENTIAL (CANCER CENTER ONLY)
Abs Immature Granulocytes: 0.01 10*3/uL (ref 0.00–0.07)
Basophils Absolute: 0 10*3/uL (ref 0.0–0.1)
Basophils Relative: 0 %
Eosinophils Absolute: 0.1 10*3/uL (ref 0.0–0.5)
Eosinophils Relative: 1 %
HCT: 39.1 % (ref 36.0–46.0)
Hemoglobin: 12.8 g/dL (ref 12.0–15.0)
Immature Granulocytes: 0 %
Lymphocytes Relative: 22 %
Lymphs Abs: 1.4 10*3/uL (ref 0.7–4.0)
MCH: 32.7 pg (ref 26.0–34.0)
MCHC: 32.7 g/dL (ref 30.0–36.0)
MCV: 99.7 fL (ref 80.0–100.0)
Monocytes Absolute: 0.5 10*3/uL (ref 0.1–1.0)
Monocytes Relative: 8 %
Neutro Abs: 4.1 10*3/uL (ref 1.7–7.7)
Neutrophils Relative %: 69 %
Platelet Count: 262 10*3/uL (ref 150–400)
RBC: 3.92 MIL/uL (ref 3.87–5.11)
RDW: 12.7 % (ref 11.5–15.5)
WBC Count: 6.1 10*3/uL (ref 4.0–10.5)
nRBC: 0 % (ref 0.0–0.2)

## 2020-03-14 LAB — CMP (CANCER CENTER ONLY)
ALT: 16 U/L (ref 0–44)
AST: 16 U/L (ref 15–41)
Albumin: 4.3 g/dL (ref 3.5–5.0)
Alkaline Phosphatase: 52 U/L (ref 38–126)
Anion gap: 8 (ref 5–15)
BUN: 15 mg/dL (ref 8–23)
CO2: 31 mmol/L (ref 22–32)
Calcium: 10.1 mg/dL (ref 8.9–10.3)
Chloride: 99 mmol/L (ref 98–111)
Creatinine: 0.72 mg/dL (ref 0.44–1.00)
GFR, Est AFR Am: >60 mL/min (ref >60)
GFR, Estimated: >60 mL/min (ref >60)
Glucose, Bld: 131 mg/dL — ABNORMAL HIGH (ref 70–99)
Potassium: 3.5 mmol/L (ref 3.5–5.1)
Sodium: 138 mmol/L (ref 135–145)
Total Bilirubin: 0.4 mg/dL (ref 0.3–1.2)
Total Protein: 6.8 g/dL (ref 6.5–8.1)

## 2020-03-14 MED ORDER — HEPARIN SOD (PORK) LOCK FLUSH 100 UNIT/ML IV SOLN
250.0000 [IU] | Freq: Once | INTRAVENOUS | Status: DC | PRN
  Filled 2020-03-14: qty 5

## 2020-03-14 MED ORDER — SODIUM CHLORIDE 0.9% FLUSH
10.0000 mL | Freq: Once | INTRAVENOUS | Status: DC
  Filled 2020-03-14: qty 10

## 2020-03-14 MED ORDER — SODIUM CHLORIDE 0.9% FLUSH
10.0000 mL | INTRAVENOUS | Status: DC | PRN
  Administered 2020-03-14 (×2): 10 mL
  Filled 2020-03-14: qty 10

## 2020-03-14 MED ORDER — SODIUM CHLORIDE 0.9% FLUSH
3.0000 mL | Freq: Once | INTRAVENOUS | Status: DC | PRN
  Filled 2020-03-14: qty 10

## 2020-03-14 MED ORDER — SODIUM CHLORIDE 0.9 % IV SOLN
200.0000 mg | Freq: Once | INTRAVENOUS | Status: DC
  Filled 2020-03-14: qty 10

## 2020-03-14 MED ORDER — HEPARIN SOD (PORK) LOCK FLUSH 100 UNIT/ML IV SOLN
500.0000 [IU] | Freq: Once | INTRAVENOUS | Status: AC | PRN
  Administered 2020-03-14: 500 [IU]
  Filled 2020-03-14: qty 5

## 2020-03-14 MED ORDER — ALTEPLASE 2 MG IJ SOLR
2.0000 mg | Freq: Once | INTRAMUSCULAR | Status: DC | PRN
  Filled 2020-03-14: qty 2

## 2020-03-14 MED ORDER — HEPARIN SOD (PORK) LOCK FLUSH 100 UNIT/ML IV SOLN
500.0000 [IU] | Freq: Once | INTRAVENOUS | Status: DC | PRN
  Filled 2020-03-14: qty 5

## 2020-03-14 MED ORDER — SODIUM CHLORIDE 0.9 % IV SOLN
Freq: Once | INTRAVENOUS | Status: DC
  Filled 2020-03-14: qty 250

## 2020-03-14 MED ORDER — SODIUM CHLORIDE 0.9% FLUSH
10.0000 mL | Freq: Once | INTRAVENOUS | Status: DC | PRN
  Filled 2020-03-14: qty 10

## 2020-03-14 NOTE — Progress Notes (Signed)
Hematology and Oncology Follow Up Visit  NAMITA EISENBERGER [id number] [birth year] 79 y.o. 03/14/2020   Principle Diagnosis:  Stage IIA ([id number]) invasive ductal ca of the LEFT breast - TRIPLE NEGATIVE Staphylococcus aureus wound infection of the LEFT axilla Cellulitis of the left breast  Past Therapy:   Taxotere/Carboplatin - s/p cycle #4 -completed adjuvant therapy on 09/09/2017 Radiation therapy to the right breast.-Completed 4005 rad with an additional 1000 rad boost on 11/11/2017  Current Therapy: Observation   Interim History:  Ms. Liegel is here today for follow-up.  We last saw her back in January.  Since then, she has been doing well.  She comes in with her daughter as always,  Ms. Suver has been pretty busy.  She and her daughter were at a camp area down by [place] on [facility] on Mother's Day weekend.  They did scrapbooking.  She is eating okay.  She is still a little constipated.  She has had no problems with infections.  There is been no fever.  She has had a coronavirus vaccine.  She has had no leg swelling.  She has had no rashes.  She is due for a mammogram next week.  Currently, her performance status is ECOG 2.   Medications:  Allergies as of 03/14/2020      Reactions   Advil [ibuprofen] Hives, Itching   Codeine Hives   Penicillins Hives   Has patient had a PCN reaction causing immediate rash, facial/tongue/throat swelling, SOB or lightheadedness with hypotension: yes Has patient had a PCN reaction causing severe rash involving mucus membranes or skin necrosis:no Has patient had a PCN reaction that required hospitalization:no Has patient had a PCN reaction occurring within the last 10 years: no If all of the above answers are "NO", then may proceed with Cephalosporin use. Has patient had a PCN reaction causing immediate rash, facial/tongue/throat swelling, SOB or lightheadedness with hypotension: yes Has patient had a PCN reaction causing  severe rash involving mucus membranes or skin necrosis:  no Has patient had a PCN reaction that required hospitalization:  no Has patient had a PCN reaction occurring within the last 10 years: no If all of the above answers are "NO", then may proceed with Cephalosporin use.   Band-aid Plus Antibiotic [bacitracin-polymyxin B] Dermatitis   Benazepril Swelling   Possible cause of tongue swelling   Other Rash   Band-aid      Medication List       Accurate as of Mar 14, 2020 12:10 PM. If you have any questions, ask your nurse or doctor.        STOP taking these medications   nitrofurantoin (macrocrystal-monohydrate) 100 MG capsule Commonly known as: MACROBID Stopped by: [name], MD     TAKE these medications   buPROPion 150 MG 24 hr tablet Commonly known as: WELLBUTRIN XL Take 150 mg by mouth every morning.   diphenhydramine-acetaminophen 25-500 MG Tabs tablet Commonly known as: TYLENOL PM Take 1 tablet by mouth at bedtime as needed (sleep).   Generlac 10 GM/15ML Soln Generic drug: lactulose (encephalopathy) TAKE 30 ML BY MOUTH EVERY 4 HOURS AS NEEDED FOR MILD CONSTIPATION UNTIL BOWEL MOVEMENT   hyaluronate sodium Gel Apply 1 application topically once.   hydrochlorothiazide 12.5 MG capsule Commonly known as: MICROZIDE Take 12.5 mg by mouth daily.   levothyroxine 75 MCG tablet Commonly known as: SYNTHROID Take 75 mcg by mouth daily.   lidocaine-prilocaine cream Commonly known as: EMLA Apply 1 application topically as needed.  Lysine 1000 MG Tabs Take 1,000 mg by mouth as needed.   multivitamin tablet Take 1 tablet by mouth daily.   omeprazole 20 MG capsule Commonly known as: PRILOSEC   OVER THE COUNTER MEDICATION Take 15 mLs by mouth 2 (two) times daily as needed (pain).   polyvinyl alcohol 1.4 % ophthalmic solution Commonly known as: LIQUIFILM TEARS 1 drop as needed for dry eyes.   potassium chloride SA 20 MEQ tablet Commonly known as:  KLOR-CON Take 2 tablets (40 mEq total) daily by mouth.   prochlorperazine 10 MG tablet Commonly known as: COMPAZINE TAKE 1 TABLET(10 MG) BY MOUTH EVERY 6 HOURS AS NEEDED FOR NAUSEA OR VOMITING   temazepam 15 MG capsule Commonly known as: RESTORIL Take 1 capsule (15 mg total) by mouth at bedtime as needed for sleep.   venlafaxine XR 75 MG 24 hr capsule Commonly known as: EFFEXOR-XR Take 225 mg by mouth daily.   zaleplon 5 MG capsule Commonly known as: Sonata Take 1 capsule (5 mg total) by mouth at bedtime as needed for sleep.       Allergies:  Allergies  Allergen Reactions  . Advil [Ibuprofen] Hives and Itching  . Codeine Hives  . Penicillins Hives    Has patient had a PCN reaction causing immediate rash, facial/tongue/throat swelling, SOB or lightheadedness with hypotension: yes Has patient had a PCN reaction causing severe rash involving mucus membranes or skin necrosis:no Has patient had a PCN reaction that required hospitalization:no Has patient had a PCN reaction occurring within the last 10 years: no If all of the above answers are "NO", then may proceed with Cephalosporin use. Has patient had a PCN reaction causing immediate rash, facial/tongue/throat swelling, SOB or lightheadedness with hypotension: yes Has patient had a PCN reaction causing severe rash involving mucus membranes or skin necrosis:  no Has patient had a PCN reaction that required hospitalization:  no Has patient had a PCN reaction occurring within the last 10 years: no If all of the above answers are "NO", then may proceed with Cephalosporin use.   . Band-Aid Plus Antibiotic [Bacitracin-Polymyxin B] Dermatitis  . Benazepril Swelling    Possible cause of tongue swelling  . Other Rash    Band-aid    Past Medical History, Surgical history, Social history, and Family History were reviewed and updated.  Review of Systems: Review of Systems  Constitutional: Negative.   HENT: Negative.   Eyes:  Negative.   Respiratory: Negative.   Cardiovascular: Negative.   Gastrointestinal: Negative.   Genitourinary: Negative.   Musculoskeletal: Negative.   Skin: Negative.   Neurological: Negative.   Endo/Heme/Allergies: Negative.   Psychiatric/Behavioral: Negative.      Physical Exam:  weight is 174 lb (78.9 kg).   Wt Readings from Last 3 Encounters:  03/14/20 174 lb (78.9 kg)  11/13/19 172 lb 1.9 oz (78.1 kg)  08/25/19 178 lb (80.7 kg)    Physical Exam Vitals reviewed.  HENT:     Head: Normocephalic and atraumatic.  Eyes:     Pupils: Pupils are equal, round, and reactive to light.  Cardiovascular:     Rate and Rhythm: Normal rate and regular rhythm.     Heart sounds: Normal heart sounds.  Pulmonary:     Effort: Pulmonary effort is normal.     Breath sounds: Normal breath sounds.  Abdominal:     General: Bowel sounds are normal.     Palpations: Abdomen is soft.  Musculoskeletal:        General:  No tenderness or deformity. Normal range of motion.     Cervical back: Normal range of motion.  Lymphadenopathy:     Cervical: No cervical adenopathy.  Skin:    General: Skin is warm and dry.     Findings: No erythema or rash.  Neurological:     Mental Status: She is alert and oriented to person, place, and time.  Psychiatric:        Behavior: Behavior normal.        Thought Content: Thought content normal.        Judgment: Judgment normal.      Lab Results  Component Value Date   WBC 6.1 03/14/2020   HGB 12.8 03/14/2020   HCT 39.1 03/14/2020   MCV 99.7 03/14/2020   PLT 262 03/14/2020   Lab Results  Component Value Date   FERRITIN 5 (L) 11/13/2019   IRON 56 11/13/2019   TIBC 374 11/13/2019   UIBC 318 11/13/2019   IRONPCTSAT 15 (L) 11/13/2019   Lab Results  Component Value Date   RBC 3.92 03/14/2020   No results found for: KPAFRELGTCHN, LAMBDASER, KAPLAMBRATIO No results found for: IGGSERUM, IGA, IGMSERUM No results found for: Odetta Pink, SPEI   Chemistry      Component Value Date/Time   NA 139 11/13/2019 1208   NA 143 10/08/2017 1042   NA 141 06/16/2017 1014   K 3.6 11/13/2019 1208   K 3.4 10/08/2017 1042   K 3.8 06/16/2017 1014   CL 101 11/13/2019 1208   CL 100 10/08/2017 1042   CO2 32 11/13/2019 1208   CO2 30 10/08/2017 1042   CO2 31 (H) 06/16/2017 1014   BUN 13 11/13/2019 1208   BUN 10 10/08/2017 1042   BUN 8.9 06/16/2017 1014   CREATININE 0.77 11/13/2019 1208   CREATININE 0.9 10/08/2017 1042   CREATININE 0.8 06/16/2017 1014      Component Value Date/Time   CALCIUM 9.8 11/13/2019 1208   CALCIUM 9.5 10/08/2017 1042   CALCIUM 10.1 06/16/2017 1014   ALKPHOS 60 11/13/2019 1208   ALKPHOS 68 10/08/2017 1042   ALKPHOS 64 06/16/2017 1014   AST 14 (L) 11/13/2019 1208   AST 21 06/16/2017 1014   ALT 14 11/13/2019 1208   ALT 20 10/08/2017 1042   ALT 20 06/16/2017 1014   BILITOT 0.3 11/13/2019 1208   BILITOT 0.37 06/16/2017 1014      Impression and Plan: Ms. Karwacki is a very pleasant 79 yo caucasian female with stage IIA ductal carcinoma of the left breast.  Her breast cancer is TRIPLE NEGATIVE.    She had her partial mastectomy in May 2018 followed by chemotherapy and radiation.  She completed chemotherapy in November 2018.  She then had radiation and completed radiation in January 2019.  Right now, I do not see any evidence of recurrent disease.  Everything looks quite well.  She still has her Port-A-Cath in.  Flushes every couple months.  We will plan to get her back in 6 months now.   [name], MD 5/13/202112:10 PM

## 2020-03-14 NOTE — Patient Instructions (Signed)

## 2020-03-14 NOTE — Addendum Note (Signed)
Addended byMelton Krebs on: 03/14/2020 02:15 PM   Modules accepted: Orders

## 2020-03-15 LAB — FERRITIN: Ferritin: 24 ng/mL (ref 11–307)

## 2020-03-15 LAB — IRON AND TIBC
Iron: 108 ug/dL (ref 41–142)
Saturation Ratios: 30 % (ref 21–57)
TIBC: 363 ug/dL (ref 236–444)
UIBC: 254 ug/dL (ref 120–384)

## 2020-03-18 ENCOUNTER — Other Ambulatory Visit: Payer: Self-pay

## 2020-03-18 ENCOUNTER — Ambulatory Visit
Admission: RE | Admit: 2020-03-18 | Discharge: 2020-03-18 | Disposition: A | Discharge status: Home / Self Care | Payer: Medicare Other | Source: Ambulatory Visit | Attending: Hematology & Oncology | Admitting: Hematology & Oncology

## 2020-03-18 DIAGNOSIS — R928 Other abnormal and inconclusive findings on diagnostic imaging of breast: Secondary | ICD-10-CM | POA: Diagnosis not present

## 2020-03-18 DIAGNOSIS — Z9889 Other specified postprocedural states: Secondary | ICD-10-CM

## 2020-03-18 DIAGNOSIS — Z853 Personal history of malignant neoplasm of breast: Secondary | ICD-10-CM | POA: Diagnosis not present

## 2020-04-07 DIAGNOSIS — K136 Irritative hyperplasia of oral mucosa: Secondary | ICD-10-CM | POA: Diagnosis not present

## 2020-04-12 DIAGNOSIS — D3709 Neoplasm of uncertain behavior of other specified sites of the oral cavity: Secondary | ICD-10-CM | POA: Diagnosis not present

## 2020-05-16 ENCOUNTER — Inpatient Hospital Stay: Payer: Medicare Other | Attending: Hematology & Oncology

## 2020-05-16 VITALS — BP 159/84 | HR 75 | Resp 17

## 2020-05-16 DIAGNOSIS — Z170 Estrogen receptor positive status [ER+]: Secondary | ICD-10-CM

## 2020-05-16 DIAGNOSIS — Z452 Encounter for adjustment and management of vascular access device: Secondary | ICD-10-CM | POA: Diagnosis not present

## 2020-05-16 DIAGNOSIS — C50212 Malignant neoplasm of upper-inner quadrant of left female breast: Secondary | ICD-10-CM

## 2020-05-16 DIAGNOSIS — Z86000 Personal history of in-situ neoplasm of breast: Secondary | ICD-10-CM | POA: Diagnosis not present

## 2020-05-16 MED ORDER — SODIUM CHLORIDE 0.9% FLUSH
10.0000 mL | INTRAVENOUS | Status: DC | PRN
  Administered 2020-05-16: 10 mL
  Filled 2020-05-16: qty 10

## 2020-05-16 MED ORDER — HEPARIN SOD (PORK) LOCK FLUSH 100 UNIT/ML IV SOLN
500.0000 [IU] | Freq: Once | INTRAVENOUS | Status: AC | PRN
  Administered 2020-05-16: 500 [IU]
  Filled 2020-05-16: qty 5

## 2020-07-05 DIAGNOSIS — I10 Essential (primary) hypertension: Secondary | ICD-10-CM | POA: Diagnosis not present

## 2020-07-05 DIAGNOSIS — E039 Hypothyroidism, unspecified: Secondary | ICD-10-CM | POA: Diagnosis not present

## 2020-07-18 ENCOUNTER — Inpatient Hospital Stay: Payer: Medicare Other | Attending: Hematology & Oncology

## 2020-07-18 ENCOUNTER — Other Ambulatory Visit: Payer: Self-pay

## 2020-07-18 VITALS — BP 145/66 | HR 84

## 2020-07-18 DIAGNOSIS — Z452 Encounter for adjustment and management of vascular access device: Secondary | ICD-10-CM | POA: Diagnosis not present

## 2020-07-18 DIAGNOSIS — Z170 Estrogen receptor positive status [ER+]: Secondary | ICD-10-CM

## 2020-07-18 DIAGNOSIS — Z86000 Personal history of in-situ neoplasm of breast: Secondary | ICD-10-CM | POA: Insufficient documentation

## 2020-07-18 DIAGNOSIS — C50212 Malignant neoplasm of upper-inner quadrant of left female breast: Secondary | ICD-10-CM

## 2020-07-18 MED ORDER — SODIUM CHLORIDE 0.9% FLUSH
10.0000 mL | INTRAVENOUS | Status: DC | PRN
  Administered 2020-07-18: 10 mL
  Filled 2020-07-18: qty 10

## 2020-07-18 MED ORDER — HEPARIN SOD (PORK) LOCK FLUSH 100 UNIT/ML IV SOLN
500.0000 [IU] | Freq: Once | INTRAVENOUS | Status: AC | PRN
  Administered 2020-07-18: 500 [IU]
  Filled 2020-07-18: qty 5

## 2020-07-18 NOTE — Patient Instructions (Signed)

## 2020-09-19 ENCOUNTER — Inpatient Hospital Stay: Payer: Medicare Other

## 2020-09-19 ENCOUNTER — Inpatient Hospital Stay (HOSPITAL_BASED_OUTPATIENT_CLINIC_OR_DEPARTMENT_OTHER): Payer: Medicare Other | Admitting: Hematology & Oncology

## 2020-09-19 ENCOUNTER — Inpatient Hospital Stay: Payer: Medicare Other | Attending: Hematology & Oncology

## 2020-09-19 ENCOUNTER — Other Ambulatory Visit: Payer: Self-pay

## 2020-09-19 ENCOUNTER — Encounter: Payer: Self-pay | Admitting: Hematology & Oncology

## 2020-09-19 VITALS — BP 155/70 | HR 96 | Temp 98.4°F | Resp 18 | Wt 175.0 lb

## 2020-09-19 DIAGNOSIS — Z86000 Personal history of in-situ neoplasm of breast: Secondary | ICD-10-CM | POA: Diagnosis not present

## 2020-09-19 DIAGNOSIS — C50212 Malignant neoplasm of upper-inner quadrant of left female breast: Secondary | ICD-10-CM

## 2020-09-19 DIAGNOSIS — Z9221 Personal history of antineoplastic chemotherapy: Secondary | ICD-10-CM | POA: Diagnosis not present

## 2020-09-19 DIAGNOSIS — Z923 Personal history of irradiation: Secondary | ICD-10-CM | POA: Diagnosis not present

## 2020-09-19 DIAGNOSIS — Z170 Estrogen receptor positive status [ER+]: Secondary | ICD-10-CM | POA: Diagnosis not present

## 2020-09-19 DIAGNOSIS — Z452 Encounter for adjustment and management of vascular access device: Secondary | ICD-10-CM | POA: Diagnosis not present

## 2020-09-19 DIAGNOSIS — Z95828 Presence of other vascular implants and grafts: Secondary | ICD-10-CM

## 2020-09-19 LAB — CBC WITH DIFFERENTIAL (CANCER CENTER ONLY)
Abs Immature Granulocytes: 0.02 10*3/uL (ref 0.00–0.07)
Basophils Absolute: 0 10*3/uL (ref 0.0–0.1)
Basophils Relative: 0 %
Eosinophils Absolute: 0.2 10*3/uL (ref 0.0–0.5)
Eosinophils Relative: 4 %
HCT: 38.6 % (ref 36.0–46.0)
Hemoglobin: 12.6 g/dL (ref 12.0–15.0)
Immature Granulocytes: 0 %
Lymphocytes Relative: 22 %
Lymphs Abs: 1.2 10*3/uL (ref 0.7–4.0)
MCH: 32.6 pg (ref 26.0–34.0)
MCHC: 32.6 g/dL (ref 30.0–36.0)
MCV: 100 fL (ref 80.0–100.0)
Monocytes Absolute: 0.4 10*3/uL (ref 0.1–1.0)
Monocytes Relative: 8 %
Neutro Abs: 3.6 10*3/uL (ref 1.7–7.7)
Neutrophils Relative %: 66 %
Platelet Count: 234 10*3/uL (ref 150–400)
RBC: 3.86 MIL/uL — ABNORMAL LOW (ref 3.87–5.11)
RDW: 12.4 % (ref 11.5–15.5)
WBC Count: 5.5 10*3/uL (ref 4.0–10.5)
nRBC: 0 % (ref 0.0–0.2)

## 2020-09-19 LAB — CMP (CANCER CENTER ONLY)
ALT: 12 U/L (ref 0–44)
AST: 16 U/L (ref 15–41)
Albumin: 4.2 g/dL (ref 3.5–5.0)
Alkaline Phosphatase: 55 U/L (ref 38–126)
Anion gap: 8 (ref 5–15)
BUN: 16 mg/dL (ref 8–23)
CO2: 31 mmol/L (ref 22–32)
Calcium: 10.1 mg/dL (ref 8.9–10.3)
Chloride: 101 mmol/L (ref 98–111)
Creatinine: 0.8 mg/dL (ref 0.44–1.00)
GFR, Estimated: >60 mL/min (ref >60)
Glucose, Bld: 125 mg/dL — ABNORMAL HIGH (ref 70–99)
Potassium: 3.4 mmol/L — ABNORMAL LOW (ref 3.5–5.1)
Sodium: 140 mmol/L (ref 135–145)
Total Bilirubin: 0.4 mg/dL (ref 0.3–1.2)
Total Protein: 7.3 g/dL (ref 6.5–8.1)

## 2020-09-19 MED ORDER — SODIUM CHLORIDE 0.9% FLUSH
10.0000 mL | Freq: Once | INTRAVENOUS | Status: AC
  Administered 2020-09-19: 10 mL via INTRAVENOUS
  Filled 2020-09-19: qty 10

## 2020-09-19 MED ORDER — HEPARIN SOD (PORK) LOCK FLUSH 100 UNIT/ML IV SOLN
500.0000 [IU] | Freq: Once | INTRAVENOUS | Status: AC
  Administered 2020-09-19: 500 [IU] via INTRAVENOUS
  Filled 2020-09-19: qty 5

## 2020-09-19 NOTE — Patient Instructions (Signed)

## 2020-09-19 NOTE — Progress Notes (Signed)
Hematology and Oncology Follow Up Visit  DAVITA SUBLETT [id number] 1941-04-14 79 y.o. 09/19/2020   Principle Diagnosis:  Stage IIA ([id number]) invasive ductal ca of the LEFT breast - TRIPLE NEGATIVE Staphylococcus aureus wound infection of the LEFT axilla Cellulitis of the left breast  Past Therapy:   Taxotere/Carboplatin - s/p cycle #4 -completed adjuvant therapy on 09/09/2017 Radiation therapy to the right breast.-Completed 4005 rad with an additional 1000 rad boost on 11/11/2017  Current Therapy: Observation   Interim History:  [name] is here today for follow-up.  We saw her 6 months ago.  This time, she comes in by herself.  Typically, her daughter comes in with her.  She has had a good summer.  She has been pretty busy.  A grandson got married in October.  She has had no problems with nausea or vomiting.  She is eating well.  Her weight is holding stable.  She has had no issues with bowels or bladder.  She has had no bleeding.  There has been no leg swelling.  She has had no chest wall pain.  She has had no cough or shortness of breath.  It sounds like she will have a nice Thanksgiving with her family.  Overall, I would say her performance status is ECOG 1.   Medications:  Allergies as of 09/19/2020      Reactions   Advil [ibuprofen] Hives, Itching   Codeine Hives   Penicillins Hives   Has patient had a PCN reaction causing immediate rash, facial/tongue/throat swelling, SOB or lightheadedness with hypotension: yes Has patient had a PCN reaction causing severe rash involving mucus membranes or skin necrosis:no Has patient had a PCN reaction that required hospitalization:no Has patient had a PCN reaction occurring within the last 10 years: no If all of the above answers are "NO", then may proceed with Cephalosporin use. Has patient had a PCN reaction causing immediate rash, facial/tongue/throat swelling, SOB or lightheadedness with hypotension: yes Has  patient had a PCN reaction causing severe rash involving mucus membranes or skin necrosis:  no Has patient had a PCN reaction that required hospitalization:  no Has patient had a PCN reaction occurring within the last 10 years: no If all of the above answers are "NO", then may proceed with Cephalosporin use.   Band-aid Plus Antibiotic [bacitracin-polymyxin B] Dermatitis   Benazepril Swelling   Possible cause of tongue swelling   Other Rash   Band-aid      Medication List       Accurate as of September 19, 2020  1:00 PM. If you have any questions, ask your nurse or doctor.        buPROPion 150 MG 24 hr tablet Commonly known as: WELLBUTRIN XL Take 150 mg by mouth every morning.   diphenhydramine-acetaminophen 25-500 MG Tabs tablet Commonly known as: TYLENOL PM Take 1 tablet by mouth at bedtime as needed (sleep).   Generlac 10 GM/15ML Soln Generic drug: lactulose (encephalopathy) TAKE 30 ML BY MOUTH EVERY 4 HOURS AS NEEDED FOR MILD CONSTIPATION UNTIL BOWEL MOVEMENT   hydrochlorothiazide 12.5 MG capsule Commonly known as: MICROZIDE Take 12.5 mg by mouth daily.   levothyroxine 75 MCG tablet Commonly known as: SYNTHROID Take 75 mcg by mouth daily.   lidocaine-prilocaine cream Commonly known as: EMLA Apply 1 application topically as needed.   Lysine 1000 MG Tabs Take 1,000 mg by mouth as needed.   multivitamin tablet Take 1 tablet by mouth daily.   omeprazole 20 MG capsule Commonly  known as: PRILOSEC   OVER THE COUNTER MEDICATION Take 15 mLs by mouth 2 (two) times daily as needed (pain).   polyvinyl alcohol 1.4 % ophthalmic solution Commonly known as: LIQUIFILM TEARS 1 drop as needed for dry eyes.   potassium chloride SA 20 MEQ tablet Commonly known as: KLOR-CON Take 2 tablets (40 mEq total) daily by mouth.   venlafaxine XR 75 MG 24 hr capsule Commonly known as: EFFEXOR-XR Take 225 mg by mouth daily.       Allergies:  Allergies  Allergen Reactions  .  Advil [Ibuprofen] Hives and Itching  . Codeine Hives  . Penicillins Hives    Has patient had a PCN reaction causing immediate rash, facial/tongue/throat swelling, SOB or lightheadedness with hypotension: yes Has patient had a PCN reaction causing severe rash involving mucus membranes or skin necrosis:no Has patient had a PCN reaction that required hospitalization:no Has patient had a PCN reaction occurring within the last 10 years: no If all of the above answers are "NO", then may proceed with Cephalosporin use. Has patient had a PCN reaction causing immediate rash, facial/tongue/throat swelling, SOB or lightheadedness with hypotension: yes Has patient had a PCN reaction causing severe rash involving mucus membranes or skin necrosis:  no Has patient had a PCN reaction that required hospitalization:  no Has patient had a PCN reaction occurring within the last 10 years: no If all of the above answers are "NO", then may proceed with Cephalosporin use.   . Band-Aid Plus Antibiotic [Bacitracin-Polymyxin B] Dermatitis  . Benazepril Swelling    Possible cause of tongue swelling  . Other Rash    Band-aid    Past Medical History, Surgical history, Social history, and Family History were reviewed and updated.  Review of Systems: Review of Systems  Constitutional: Negative.   HENT: Negative.   Eyes: Negative.   Respiratory: Negative.   Cardiovascular: Negative.   Gastrointestinal: Negative.   Genitourinary: Negative.   Musculoskeletal: Negative.   Skin: Negative.   Neurological: Negative.   Endo/Heme/Allergies: Negative.   Psychiatric/Behavioral: Negative.      Physical Exam:  vitals were not taken for this visit.   Wt Readings from Last 3 Encounters:  09/19/20 175 lb (79.4 kg)  03/14/20 174 lb (78.9 kg)  11/13/19 172 lb 1.9 oz (78.1 kg)  Vital signs were taken.  Temperature 98.4.  Pulse 96.  Blood pressure 125/70.  Weight is 175 pounds.  Physical Exam Vitals reviewed.   Constitutional:      Comments: Her breast exam shows right breast with no masses, edema or erythema.  There is no right axillary adenopathy.  Left breast shows the lumpectomy that is well-healed.  She has some contraction of the left breast.  There are some firmness at the lumpectomy site secondary to prior infection that required secondary intention.  There is no distinct mass in the left breast.  There is no left axillary adenopathy.  There is some telangiectasias on the left breast from radiation.  HENT:     Head: Normocephalic and atraumatic.  Eyes:     Pupils: Pupils are equal, round, and reactive to light.  Cardiovascular:     Rate and Rhythm: Normal rate and regular rhythm.     Heart sounds: Normal heart sounds.  Pulmonary:     Effort: Pulmonary effort is normal.     Breath sounds: Normal breath sounds.  Abdominal:     General: Bowel sounds are normal.     Palpations: Abdomen is soft.  Musculoskeletal:  General: No tenderness or deformity. Normal range of motion.     Cervical back: Normal range of motion.  Lymphadenopathy:     Cervical: No cervical adenopathy.  Skin:    General: Skin is warm and dry.     Findings: No erythema or rash.  Neurological:     Mental Status: She is alert and oriented to person, place, and time.  Psychiatric:        Behavior: Behavior normal.        Thought Content: Thought content normal.        Judgment: Judgment normal.      Lab Results  Component Value Date   WBC 5.5 09/19/2020   HGB 12.6 09/19/2020   HCT 38.6 09/19/2020   MCV 100.0 09/19/2020   PLT 234 09/19/2020   Lab Results  Component Value Date   FERRITIN 24 03/14/2020   IRON 108 03/14/2020   TIBC 363 03/14/2020   UIBC 254 03/14/2020   IRONPCTSAT 30 03/14/2020   Lab Results  Component Value Date   RBC 3.86 (L) 09/19/2020   No results found for: KPAFRELGTCHN, LAMBDASER, KAPLAMBRATIO No results found for: IGGSERUM, IGA, IGMSERUM No results found for: Odetta Pink, SPEI   Chemistry      Component Value Date/Time   NA 140 09/19/2020 1210   NA 143 10/08/2017 1042   NA 141 06/16/2017 1014   K 3.4 (L) 09/19/2020 1210   K 3.4 10/08/2017 1042   K 3.8 06/16/2017 1014   CL 101 09/19/2020 1210   CL 100 10/08/2017 1042   CO2 31 09/19/2020 1210   CO2 30 10/08/2017 1042   CO2 31 (H) 06/16/2017 1014   BUN 16 09/19/2020 1210   BUN 10 10/08/2017 1042   BUN 8.9 06/16/2017 1014   CREATININE 0.80 09/19/2020 1210   CREATININE 0.9 10/08/2017 1042   CREATININE 0.8 06/16/2017 1014      Component Value Date/Time   CALCIUM 10.1 09/19/2020 1210   CALCIUM 9.5 10/08/2017 1042   CALCIUM 10.1 06/16/2017 1014   ALKPHOS 55 09/19/2020 1210   ALKPHOS 68 10/08/2017 1042   ALKPHOS 64 06/16/2017 1014   AST 16 09/19/2020 1210   AST 21 06/16/2017 1014   ALT 12 09/19/2020 1210   ALT 20 10/08/2017 1042   ALT 20 06/16/2017 1014   BILITOT 0.4 09/19/2020 1210   BILITOT 0.37 06/16/2017 1014      Impression and Plan: Ms. Krasinski is a very pleasant 79 yo caucasian female with stage IIA ductal carcinoma of the left breast.  Her breast cancer is TRIPLE NEGATIVE.    She had her partial mastectomy in May 2018 followed by chemotherapy and radiation.  She completed chemotherapy in November 2018.  She then had radiation and completed radiation in January 2019.  Right now, I do not see any evidence of recurrent disease.  Everything looks quite well.  She still has her Port-A-Cath in.  We will go ahead and flush the Port-A-Cath every couple months.  We will plan to get her back in 6 months now.   [name], MD 11/18/20211:00 PM

## 2020-09-20 ENCOUNTER — Telehealth: Payer: Self-pay

## 2020-09-20 NOTE — Telephone Encounter (Signed)
S/w pts daughter and she is aware of her appts per 09/19/20 LOS... AOM

## 2020-10-19 ENCOUNTER — Emergency Department (HOSPITAL_BASED_OUTPATIENT_CLINIC_OR_DEPARTMENT_OTHER): Payer: Medicare Other

## 2020-10-19 ENCOUNTER — Other Ambulatory Visit: Payer: Self-pay

## 2020-10-19 ENCOUNTER — Emergency Department (HOSPITAL_BASED_OUTPATIENT_CLINIC_OR_DEPARTMENT_OTHER)
Admission: EM | Admit: 2020-10-19 | Discharge: 2020-10-19 | Disposition: A | Discharge status: Home / Self Care | Payer: Medicare Other | Attending: Emergency Medicine | Admitting: Emergency Medicine

## 2020-10-19 ENCOUNTER — Encounter (HOSPITAL_BASED_OUTPATIENT_CLINIC_OR_DEPARTMENT_OTHER): Payer: Self-pay | Admitting: Emergency Medicine

## 2020-10-19 DIAGNOSIS — Z853 Personal history of malignant neoplasm of breast: Secondary | ICD-10-CM | POA: Diagnosis not present

## 2020-10-19 DIAGNOSIS — R200 Anesthesia of skin: Secondary | ICD-10-CM | POA: Diagnosis present

## 2020-10-19 DIAGNOSIS — J45909 Unspecified asthma, uncomplicated: Secondary | ICD-10-CM | POA: Diagnosis not present

## 2020-10-19 DIAGNOSIS — N189 Chronic kidney disease, unspecified: Secondary | ICD-10-CM | POA: Insufficient documentation

## 2020-10-19 DIAGNOSIS — I639 Cerebral infarction, unspecified: Secondary | ICD-10-CM | POA: Diagnosis not present

## 2020-10-19 DIAGNOSIS — Z79899 Other long term (current) drug therapy: Secondary | ICD-10-CM | POA: Diagnosis not present

## 2020-10-19 DIAGNOSIS — E039 Hypothyroidism, unspecified: Secondary | ICD-10-CM | POA: Insufficient documentation

## 2020-10-19 DIAGNOSIS — I6782 Cerebral ischemia: Secondary | ICD-10-CM | POA: Diagnosis not present

## 2020-10-19 DIAGNOSIS — R202 Paresthesia of skin: Secondary | ICD-10-CM | POA: Diagnosis not present

## 2020-10-19 DIAGNOSIS — I129 Hypertensive chronic kidney disease with stage 1 through stage 4 chronic kidney disease, or unspecified chronic kidney disease: Secondary | ICD-10-CM | POA: Insufficient documentation

## 2020-10-19 DIAGNOSIS — R29818 Other symptoms and signs involving the nervous system: Secondary | ICD-10-CM | POA: Diagnosis not present

## 2020-10-19 DIAGNOSIS — M50223 Other cervical disc displacement at C6-C7 level: Secondary | ICD-10-CM | POA: Diagnosis not present

## 2020-10-19 DIAGNOSIS — M47812 Spondylosis without myelopathy or radiculopathy, cervical region: Secondary | ICD-10-CM | POA: Diagnosis not present

## 2020-10-19 DIAGNOSIS — G9389 Other specified disorders of brain: Secondary | ICD-10-CM | POA: Diagnosis not present

## 2020-10-19 DIAGNOSIS — I672 Cerebral atherosclerosis: Secondary | ICD-10-CM | POA: Diagnosis not present

## 2020-10-19 DIAGNOSIS — M2578 Osteophyte, vertebrae: Secondary | ICD-10-CM | POA: Diagnosis not present

## 2020-10-19 LAB — COMPREHENSIVE METABOLIC PANEL
ALT: 19 U/L (ref 0–44)
AST: 24 U/L (ref 15–41)
Albumin: 4.1 g/dL (ref 3.5–5.0)
Alkaline Phosphatase: 61 U/L (ref 38–126)
Anion gap: 10 (ref 5–15)
BUN: 13 mg/dL (ref 8–23)
CO2: 31 mmol/L (ref 22–32)
Calcium: 9.6 mg/dL (ref 8.9–10.3)
Chloride: 97 mmol/L — ABNORMAL LOW (ref 98–111)
Creatinine, Ser: 0.85 mg/dL (ref 0.44–1.00)
GFR, Estimated: >60 mL/min (ref >60)
Glucose, Bld: 102 mg/dL — ABNORMAL HIGH (ref 70–99)
Potassium: 3.4 mmol/L — ABNORMAL LOW (ref 3.5–5.1)
Sodium: 138 mmol/L (ref 135–145)
Total Bilirubin: 0.4 mg/dL (ref 0.3–1.2)
Total Protein: 7.5 g/dL (ref 6.5–8.1)

## 2020-10-19 LAB — PROTIME-INR
INR: 1 (ref 0.8–1.2)
Prothrombin Time: 12.7 seconds (ref 11.4–15.2)

## 2020-10-19 LAB — DIFFERENTIAL
Abs Immature Granulocytes: 0.01 10*3/uL (ref 0.00–0.07)
Basophils Absolute: 0 10*3/uL (ref 0.0–0.1)
Basophils Relative: 0 %
Eosinophils Absolute: 0.2 10*3/uL (ref 0.0–0.5)
Eosinophils Relative: 3 %
Immature Granulocytes: 0 %
Lymphocytes Relative: 24 %
Lymphs Abs: 1.2 10*3/uL (ref 0.7–4.0)
Monocytes Absolute: 0.6 10*3/uL (ref 0.1–1.0)
Monocytes Relative: 12 %
Neutro Abs: 3 10*3/uL (ref 1.7–7.7)
Neutrophils Relative %: 61 %

## 2020-10-19 LAB — CBC
HCT: 38 % (ref 36.0–46.0)
Hemoglobin: 12.5 g/dL (ref 12.0–15.0)
MCH: 33 pg (ref 26.0–34.0)
MCHC: 32.9 g/dL (ref 30.0–36.0)
MCV: 100.3 fL — ABNORMAL HIGH (ref 80.0–100.0)
Platelets: 253 10*3/uL (ref 150–400)
RBC: 3.79 MIL/uL — ABNORMAL LOW (ref 3.87–5.11)
RDW: 12.5 % (ref 11.5–15.5)
WBC: 5 10*3/uL (ref 4.0–10.5)
nRBC: 0 % (ref 0.0–0.2)

## 2020-10-19 LAB — CBG MONITORING, ED: Glucose-Capillary: 99 mg/dL (ref 70–99)

## 2020-10-19 LAB — APTT: aPTT: 33 seconds (ref 24–36)

## 2020-10-19 MED ORDER — GADOBUTROL 1 MMOL/ML IV SOLN
8.0000 mL | Freq: Once | INTRAVENOUS | Status: AC | PRN
  Administered 2020-10-19: 8 mL via INTRAVENOUS

## 2020-10-19 MED ORDER — SODIUM CHLORIDE 0.9% FLUSH
3.0000 mL | Freq: Once | INTRAVENOUS | Status: DC
Start: 2020-10-19 — End: 2020-10-20
  Filled 2020-10-19: qty 3

## 2020-10-19 NOTE — ED Provider Notes (Signed)
[facility] EMERGENCY DEPARTMENT Provider Note   CSN: [id number] Arrival date & time: 10/19/20  1613     History Chief Complaint  Patient presents with  . Tingling    Jessica [name] is a 79 y.o. female.  Patient is a 79 year old female who presents with a possible stroke symptoms.  They started at 1 PM this afternoon.  She noticed that she was having some numbness in her left arm and the left side of her face.  She also noted that her left leg felt a little bit unusual.  She does not report any weakness.  No difficulty with her speech.  No change in her vision.  No recent head trauma.  No history of strokes in the past.  She is not on anticoagulants.  She does have a history of breast cancer.  Symptoms been constant since they started.        Past Medical History:  Diagnosis Date  . Allergy   . Anemia   . Anxiety   . Asthma    in past, no inhalers now  . Blood transfusion without reported diagnosis   . Breast cancer ([place]) 01/2017   left breast   . Breast cancer of upper-inner quadrant of left female breast ([place]) 03/11/2017  . Cancer ([place]) 01/2017   left breast  . Cataract    bilateral removed  . Chronic kidney disease    kidney stones  . Depression 08/31/2013  . Dyslipidemia, goal LDL below 160 08/31/2013  . Essential hypertension - well controlled 08/31/2013  . Family history of breast cancer   . Family history of melanoma   . Family history of prostate cancer   . GERD (gastroesophageal reflux disease)   . Goals of care, counseling/discussion 03/11/2017  . History of radiation therapy 10/13/17-11/11/17   left breast 40.05 Gy in 15 fractions, left breast boost 10 Gy in 5 fractions  . Hypothyroidism   . Obesity (BMI 30-39.9) 08/31/2013  . Personal history of chemotherapy 2018  . Personal history of radiation therapy 2019  . Thyroid disease   . Vasovagal near-syncope -rare 08/31/2013    Patient Active Problem List   Diagnosis Date Noted  . Insomnia  06/25/2019  . Obstructive sleep apnea 06/13/2019  . Genetic testing 06/29/2017  . Family history of melanoma   . Family history of prostate cancer   . Family history of breast cancer   . Left axillary seroma status post incision and drainage 05/18/2017 05/18/2017  . Cellulitis of left axilla 05/17/2017  . GERD (gastroesophageal reflux disease) 05/16/2017  . Hypothyroidism 05/16/2017  . Depression with anxiety 05/16/2017  . Breast cancer of upper-inner quadrant of left female breast ([place]) 03/11/2017  . Goals of care, counseling/discussion 03/11/2017  . Obesity (BMI 30-39.9) 08/31/2013  . Essential hypertension - well controlled 08/31/2013    Class: Diagnosis of  . Depression 08/31/2013    Class: Diagnosis of  . Vasovagal near-syncope -rare 08/31/2013    Class: History of  . Dyslipidemia, goal LDL below 160 08/31/2013    Past Surgical History:  Procedure Laterality Date  . ABDOMINAL HYSTERECTOMY     total  . BREAST BIOPSY Left 02/24/2017    malignant  . BREAST LUMPECTOMY Left 03/23/2017  . broken fingers    . CATARACT EXTRACTION Bilateral   . COLONOSCOPY    . EXCISIONAL HEMORRHOIDECTOMY    . IR FLUORO GUIDE PORT INSERTION RIGHT  08/04/2017  . IR REMOVAL TUN ACCESS W/ PORT W/O FL MOD SED  08/04/2017  . IR US GUIDE VASC ACCESS RIGHT  08/04/2017  . IRRIGATION AND DEBRIDEMENT ABSCESS Left 05/18/2017   Procedure: IRRIGATION AND DEBRIDEMENT LEFT AXILLARY ABSCESS;  Surgeon: Michael [place], MD;  Location: [facility];  Service: General;  Laterality: Left;  . kidney stones lithotripsy    . PORTACATH PLACEMENT Right 03/23/2017   Procedure: INSERTION PORT-A-CATH;  Surgeon: Erroll Luna, MD;  Location: [facility];  Service: General;  Laterality: Right;  . RADIOACTIVE SEED GUIDED PARTIAL MASTECTOMY WITH AXILLARY SENTINEL LYMPH NODE BIOPSY Left 03/23/2017   Procedure: LEFT BREAST RADIOACTIVE SEED GUIDED PARTIAL MASTECTOMY AND  SENTINEL LYMPH NODE MAPPING;  Surgeon: Erroll Luna,  MD;  Location: [facility];  Service: General;  Laterality: Left;  . ROTATOR CUFF REPAIR     left   . TONSILLECTOMY    . WISDOM TOOTH EXTRACTION       OB History   No obstetric history on file.     Family History  Problem Relation Age of Onset  . Heart disease Mother        Angina  . Heart disease Father   . Cervical cancer Maternal Aunt   . Heart attack Brother        Died of MI in his mid 68s  . Prostate cancer Brother        dx in his early 33s  . Breast cancer Paternal Aunt        dx over 40  . Bone cancer Cousin        maternal first cousin dx in her 62s-60s  . Lung cancer Cousin        paternal first cousin  . Colon cancer Neg Hx   . Pancreatic cancer Neg Hx   . Stomach cancer Neg Hx   . Esophageal cancer Neg Hx   . Rectal cancer Neg Hx     Social History   Tobacco Use  . Smoking status: Never Smoker  . Smokeless tobacco: Never Used  Vaping Use  . Vaping Use: Never used  Substance Use Topics  . Alcohol use: No  . Drug use: No    Home Medications Prior to Admission medications   Medication Sig Start Date End Date Taking? Authorizing Provider  buPROPion (WELLBUTRIN XL) 150 MG 24 hr tablet Take 150 mg by mouth every morning. 10/29/19   [provider]  diphenhydramine-acetaminophen (TYLENOL PM) 25-500 MG TABS Take 1 tablet by mouth at bedtime as needed (sleep).     [provider]  GENERLAC 10 GM/15ML SOLN TAKE 30 ML BY MOUTH EVERY 4 HOURS AS NEEDED FOR MILD CONSTIPATION UNTIL BOWEL MOVEMENT 09/13/17   [name], MD  hydrochlorothiazide (MICROZIDE) 12.5 MG capsule Take 12.5 mg by mouth daily. 02/28/16   [provider]  levothyroxine (SYNTHROID, LEVOTHROID) 75 MCG tablet Take 75 mcg by mouth daily. 07/19/13   [provider]  lidocaine-prilocaine (EMLA) cream Apply 1 application topically as needed. 01/09/20   [name], MD  Lysine 1000 MG TABS Take 1,000 mg by mouth as needed.    [provider]  Multiple Vitamin (MULTIVITAMIN) tablet Take 1 tablet by mouth daily.    [provider]  omeprazole (PRILOSEC) 20 MG capsule  06/24/17   [provider]  OVER THE COUNTER MEDICATION Take 15 mLs by mouth 2 (two) times daily as needed (pain).    [provider]  polyvinyl alcohol (LIQUIFILM TEARS) 1.4 % ophthalmic solution 1 drop as needed for  dry eyes.    [provider]  potassium chloride SA (K-DUR,KLOR-CON) 20 MEQ tablet Take 2 tablets (40 mEq total) daily by mouth. 09/09/17   [name], MD  venlafaxine XR (EFFEXOR-XR) 75 MG 24 hr capsule Take 225 mg by mouth daily.    [provider]    Allergies    Advil [ibuprofen], Codeine, Penicillins, Band-aid plus antibiotic [bacitracin-polymyxin b], Benazepril, and Other  Review of Systems   Review of Systems  Constitutional: Negative for chills, diaphoresis, fatigue and fever.  HENT: Negative for congestion, rhinorrhea and sneezing.   Eyes: Negative.   Respiratory: Negative for cough, chest tightness and shortness of breath.   Cardiovascular: Negative for chest pain and leg swelling.  Gastrointestinal: Negative for abdominal pain, blood in stool, diarrhea, nausea and vomiting.  Genitourinary: Negative for difficulty urinating, flank pain, frequency and hematuria.  Musculoskeletal: Negative for arthralgias and back pain.  Skin: Negative for rash.  Neurological: Positive for numbness. Negative for dizziness, speech difficulty, weakness and headaches.    Physical Exam Updated Vital Signs BP (!) 163/87 (BP Location: Left Arm)   Pulse 68   Temp 98 F (36.7 C) (Oral)   Resp (!) 22   Ht 5\' 3"  (1.6 m)   Wt 80.5 kg   SpO2 99%   BMI 31.44 kg/m   Physical Exam Constitutional:      Appearance: She is well-developed and well-nourished.  HENT:     Head: Normocephalic and atraumatic.  Eyes:     Pupils: Pupils are equal, round, and reactive to light.  Cardiovascular:     Rate  and Rhythm: Normal rate and regular rhythm.     Heart sounds: Normal heart sounds.  Pulmonary:     Effort: Pulmonary effort is normal. No respiratory distress.     Breath sounds: Normal breath sounds. No wheezing or rales.  Chest:     Chest wall: No tenderness.  Abdominal:     General: Bowel sounds are normal.     Palpations: Abdomen is soft.     Tenderness: There is no abdominal tenderness. There is no guarding or rebound.  Musculoskeletal:        General: No edema. Normal range of motion.     Cervical back: Normal range of motion and neck supple.  Lymphadenopathy:     Cervical: No cervical adenopathy.  Skin:    General: Skin is warm and dry.     Findings: No rash.  Neurological:     Mental Status: She is alert and oriented to person, place, and time.     Comments: Patient has some numbness to her left arm and the left side of her face.  She has symmetric grip strength.  She has symmetric strength to her lower extremities.  No facial drooping.  No other cranial nerve deficit noted.  Psychiatric:        Mood and Affect: Mood and affect normal.     ED Results / Procedures / Treatments   Labs (all labs ordered are listed, but only abnormal results are displayed) Labs Reviewed  CBC - Abnormal; Notable for the following components:      Result Value   RBC 3.79 (*)    MCV 100.3 (*)    All other components within normal limits  COMPREHENSIVE METABOLIC PANEL - Abnormal; Notable for the following components:   Potassium 3.4 (*)    Chloride 97 (*)    Glucose, Bld 102 (*)    All other components within normal  limits  PROTIME-INR  APTT  DIFFERENTIAL  CBG MONITORING, ED    EKG EKG Interpretation  Date/Time:  Saturday October 19 2020 16:36:24 EST Ventricular Rate:  72 PR Interval:    QRS Duration: 132 QT Interval:  440 QTC Calculation: 482 R Axis:   34 Text Interpretation: Sinus rhythm Right bundle branch block Abnormal T, consider ischemia, lateral leads since last  tracing no significant change Confirmed by Malvin Johns [phone]) on 10/19/2020 5:09:09 PM   Radiology MR Brain W and Wo Contrast  Result Date: 10/19/2020 CLINICAL DATA:  Left face and arm tingling beginning today. EXAM: MRI HEAD WITHOUT AND WITH CONTRAST TECHNIQUE: Multiplanar, multiecho pulse sequences of the brain and surrounding structures were obtained without and with intravenous contrast. CONTRAST:  13mL GADAVIST GADOBUTROL 1 MMOL/ML IV SOLN COMPARISON:  CT study earlier same day. FINDINGS: Brain: Diffusion imaging does not show any acute or subacute infarction. Brainstem and cerebellum are unremarkable. There is old infarction in the right basal ganglia and adjacent white matter tracks with encephalomalacia. Elsewhere, there are only minimal chronic small-vessel ischemic changes of the white matter. No sign of mass lesion, hemorrhage, hydrocephalus or extra-axial collection. Minimal hemosiderin deposition in the region of the old right basal ganglia stroke. After contrast administration, no abnormal enhancement occurs. Vascular: Major vessels at the base of the brain show flow. Skull and upper cervical spine: Negative Sinuses/Orbits: Clear/normal Other: None IMPRESSION: No acute finding by MRI. Old infarction in the right basal ganglia and adjacent white matter tracks. Minimal small vessel change of the hemispheric white matter. Electronically Signed   By: Nelson Chimes M.D.   On: 10/19/2020 18:49   MR Cervical Spine W or Wo Contrast  Result Date: 10/19/2020 CLINICAL DATA:  Left arm tingling beginning today EXAM: MRI CERVICAL SPINE WITHOUT AND WITH CONTRAST TECHNIQUE: Multiplanar and multiecho pulse sequences of the cervical spine, to include the craniocervical junction and cervicothoracic junction, were obtained without and with intravenous contrast. CONTRAST:  84mL GADAVIST GADOBUTROL 1 MMOL/ML IV SOLN COMPARISON:  None. FINDINGS: Alignment: Normal Vertebrae: No fracture or primary bone lesion.  Cord: No cord compression or primary cord lesion. Posterior Fossa, vertebral arteries, paraspinal tissues: Negative Disc levels: Foramen magnum is widely patent. C1-2, C2-3 and C3-4 disc levels are normal. No stenosis of the canal or foramina. C4-5, C5-6 and C6-7: Mild spondylosis with endplate osteophytes and mild bulging of the discs. Mild bilateral foraminal narrowing at C5-6 and C6-7, but without likely neural compression. C7-T1: Normal interspace.  No significant upper thoracic finding. IMPRESSION: No cause of the presenting symptoms is identified. Mild degenerative spondylosis at C4-5, C5-6 and C6-7 with mild bilateral foraminal narrowing at C5-6 and C6-7, but without distinct neural compression. Electronically Signed   By: Nelson Chimes M.D.   On: 10/19/2020 18:51   CT HEAD CODE STROKE WO CONTRAST  Result Date: 10/19/2020 CLINICAL DATA:  Code stroke. Left arm and left face tingling beginning 1300 hours. EXAM: CT HEAD WITHOUT CONTRAST TECHNIQUE: Contiguous axial images were obtained from the base of the skull through the vertex without intravenous contrast. COMPARISON:  None. FINDINGS: Brain: Age related volume loss. No focal abnormality affects the brainstem or cerebellum. Cerebral hemispheres show old infarction in the right basal ganglia. There chronic small-vessel ischemic changes of the white matter. No cortical or large vessel territory infarction. No mass lesion, hemorrhage, hydrocephalus or extra-axial collection. Vascular: There is atherosclerotic calcification of the major vessels at the base of the brain. Skull: Negative Sinuses/Orbits: Clear/normal Other: None ASPECTS (  [place] Stroke Program Early CT Score) - Ganglionic level infarction (caudate, lentiform nuclei, internal capsule, insula, M1-M3 cortex): 7, allowing for the old basal ganglia infarction. - Supraganglionic infarction (M4-M6 cortex): 3 Total score (0-10 with 10 being normal): 10, allowing for the old right basal ganglia  infarction. IMPRESSION: 1. No acute finding by CT. Old right basal ganglia infarction. Chronic small-vessel ischemic changes of the white matter. 2. ASPECTS is 10, allowing for the old right basal ganglia infarction. 3. These results were called by telephone at the time of interpretation on 10/19/2020 at 4:37 pm to provider Dr. Tamera Punt, who verbally acknowledged these results. Electronically Signed   By: Nelson Chimes M.D.   On: 10/19/2020 16:38    Procedures Procedures (including critical care time)  Medications Ordered in ED Medications  sodium chloride flush (NS) 0.9 % injection 3 mL (3 mLs Intravenous Not Given 10/19/20 1631)  gadobutrol (GADAVIST) 1 MMOL/ML injection 8 mL (8 mLs Intravenous Contrast Given 10/19/20 1841)    ED Course  I have reviewed the triage vital signs and the nursing notes.  Pertinent labs & imaging results that were available during my care of the patient were reviewed by me and considered in my medical decision making (see chart for details).    MDM Rules/Calculators/A&P                          Patient is a 79 year old female who presented with paresthesias to her left arm, left leg and face that started at 1 PM today.  Code stroke was activated on arrival.  Her head CT shows no acute abnormalities.  Her labs are nonconcerning.  Telemetry neurology has evaluated the patient and felt that her symptoms were less likely to be consistent with an acute CVA.  Her symptoms have resolved in the ED.  He did recommend getting an MRI of the brain with and without contrast and MRI of the cervical spine.  The studies were done and show no significant acute abnormalities.  He felt that she could be discharged if the studies were unremarkable.  She was discharged home in good condition.  She was given an ambulatory referral to neurology for follow-up.  I did advise the family that if they have not heard from them by Monday to call make an appointment.  She was given strict return  precautions.  I did advise them if she were to have any symptoms that were concerning for stroke or seizure, they should call 911 and [facility] would be the more appropriate facility for those specific complaints.  They are agreeable to this. Final Clinical Impression(s) / ED Diagnoses Final diagnoses:  Paresthesias    Rx / DC Orders ED Discharge Orders         Ordered    Ambulatory referral to Neurology       Comments: An appointment is requested in approximately: 1 week   10/19/20 1919           Malvin Johns, MD 10/19/20 [phone]

## 2020-10-19 NOTE — ED Triage Notes (Signed)
Pt reports L arm and L face tingling since 1pm. Denies weakness. Speech is clear.

## 2020-10-19 NOTE — ED Notes (Signed)
Patient transported to MRI 

## 2020-10-19 NOTE — Consult Note (Signed)
[facility] Telemedicine Consult  Requesting Provider: Malvin Johns Consult Participants: Verlene Mayer RN, bedside RN, patient, daughter Location of the provider: [facility] Location of the patient: Jessica [name]  This consult was provided via telemedicine with 2-way video and audio communication. The patient/family was informed that care would be provided in this way and agreed to receive care in this manner.   Chief Complaint: Left arm tingling  HPI: This is a 79 year old woman with a past medical history significant for breast cancer (stage IIa, status post Taxotere/carboplatin November 2018, radiation completed in January 2019, in remission as of 11/21) hypertension, hyperlipidemia, asthma, anxiety, hypothyroidism  She reports that she has been in a completely normal state of health when she started to have some left face and arm tingling starting about 11 AM.  This is initially intermittent but then became prolonged starting at about 1 PM.  It was reportedly still continuing to get to the ED but when I asked her if this was still present and it had resolved during the course of my evaluation.  She denies any neck pain, she denies any signs and 6 symptoms of infection such as fevers, chills, sweats, nausea, vomiting.  She denies any new weakness though states she has some chronic weakness in that arm after shoulder surgery.    She is unsure if the symptoms start in one part of her arm and spread or not.  She feels that her fingers and hands are involved through at least the elbow and describes it as a sensation of it falling asleep (pins/needles).  Otherwise sensation is not affected.  LKW: 1 PM tpa given?: No, symptoms minor and non-disabling  Time of teleneurologist evaluation: 4:40 PM  Past Medical History:  Diagnosis Date  . Allergy   . Anemia   . Anxiety   . Asthma    in past, no inhalers now  . Blood transfusion without reported diagnosis   . Breast cancer  ([place]) 01/2017   left breast   . Breast cancer of upper-inner quadrant of left female breast ([place]) 03/11/2017  . Cancer ([place]) 01/2017   left breast  . Cataract    bilateral removed  . Chronic kidney disease    kidney stones  . Depression 08/31/2013  . Dyslipidemia, goal LDL below 160 08/31/2013  . Essential hypertension - well controlled 08/31/2013  . Family history of breast cancer   . Family history of melanoma   . Family history of prostate cancer   . GERD (gastroesophageal reflux disease)   . Goals of care, counseling/discussion 03/11/2017  . History of radiation therapy 10/13/17-11/11/17   left breast 40.05 Gy in 15 fractions, left breast boost 10 Gy in 5 fractions  . Hypothyroidism   . Obesity (BMI 30-39.9) 08/31/2013  . Personal history of chemotherapy 2018  . Personal history of radiation therapy 2019  . Thyroid disease   . Vasovagal near-syncope -rare 08/31/2013   Past Surgical History:  Procedure Laterality Date  . ABDOMINAL HYSTERECTOMY     total  . BREAST BIOPSY Left 02/24/2017    malignant  . BREAST LUMPECTOMY Left 03/23/2017  . broken fingers    . CATARACT EXTRACTION Bilateral   . COLONOSCOPY    . EXCISIONAL HEMORRHOIDECTOMY    . IR FLUORO GUIDE PORT INSERTION RIGHT  08/04/2017  . IR REMOVAL TUN ACCESS W/ PORT W/O FL MOD SED  08/04/2017  . IR US GUIDE VASC ACCESS RIGHT  08/04/2017  . IRRIGATION AND DEBRIDEMENT ABSCESS Left 05/18/2017  Procedure: IRRIGATION AND DEBRIDEMENT LEFT AXILLARY ABSCESS;  Surgeon: Michael [facility], MD;  Location: [facility];  Service: General;  Laterality: Left;  . kidney stones lithotripsy    . PORTACATH PLACEMENT Right 03/23/2017   Procedure: INSERTION PORT-A-CATH;  Surgeon: Erroll Luna, MD;  Location: [facility];  Service: General;  Laterality: Right;  . RADIOACTIVE SEED GUIDED PARTIAL MASTECTOMY WITH AXILLARY SENTINEL LYMPH NODE BIOPSY Left 03/23/2017   Procedure: LEFT BREAST RADIOACTIVE SEED GUIDED PARTIAL MASTECTOMY AND   SENTINEL LYMPH NODE MAPPING;  Surgeon: Erroll Luna, MD;  Location: [facility];  Service: General;  Laterality: Left;  . ROTATOR CUFF REPAIR     left   . TONSILLECTOMY    . WISDOM TOOTH EXTRACTION      Current Outpatient Medications  Medication Instructions  . buPROPion (WELLBUTRIN XL) 150 mg, Oral,  Every morning - 10a  . diphenhydramine-acetaminophen (TYLENOL PM) 25-500 MG TABS 1 tablet, Oral, At bedtime PRN  . GENERLAC 10 GM/15ML SOLN TAKE 30 ML BY MOUTH EVERY 4 HOURS AS NEEDED FOR MILD CONSTIPATION UNTIL BOWEL MOVEMENT  . hydrochlorothiazide (MICROZIDE) 12.5 mg, Oral, Daily  . levothyroxine (SYNTHROID) 75 mcg, Oral, Daily  . lidocaine-prilocaine (EMLA) cream 1 application, Topical, As needed  . Lysine 1,000 mg, Oral, As needed  . Multiple Vitamin (MULTIVITAMIN) tablet 1 tablet, Oral, Daily  . omeprazole (PRILOSEC) 20 MG capsule No dose, route, or frequency recorded.  Marland Kitchen OVER THE COUNTER MEDICATION 15 mLs, Oral, 2 times daily PRN  . polyvinyl alcohol (LIQUIFILM TEARS) 1.4 % ophthalmic solution 1 drop, As needed  . potassium chloride SA (K-DUR,KLOR-CON) 20 MEQ tablet 40 mEq, Oral, Daily  . venlafaxine XR (EFFEXOR-XR) 225 mg, Oral, Daily     Exam: Vitals:   10/19/20 1618 10/19/20 1650  BP: (!) 153/69 (!) 146/49  Pulse: 77 72  Resp: 18 17  Temp:  98.3 F (36.8 C)  SpO2: 99% 94%    General:  Calm, comfortable, cooperative, reactive affect, breathing comfortably,   1A: Level of Consciousness - 0 1B: Ask Month and Age - 1 (states she was born in [birth year] and can never remember exactly how old she is, reports she is [age]) 1C: 'Blink Eyes' & 'Squeeze Hands' - 0 2: Test Horizontal Extraocular Movements - 0 3: Test Visual Fields - 0 4: Test Facial Palsy - 0 5A: Test Left Arm Motor Drift - 0 5B: Test Right Arm Motor Drift - 0 6A: Test Left Leg Motor Drift - 0 6B: Test Right Leg Motor Drift - 0 7: Test Limb Ataxia - 0 8: Test Sensation - 0 (intact to  light touch, she just additionally has a positive tingling sensation) 9: Test Language/Aphasia- 0 10: Test Dysarthria - 0 11: Test Extinction/Inattention - 0 NIHSS score: 1  Imaging Reviewed: Head CT without any acute intracranial process but there is some  Labs reviewed in epic and pertinent values follow: Creatinine 0.85 Glucose 102 CBC with a mild macrocytic anemia  Assessment: Strokes typically present with negative rather than positive symptoms (loss of sensation other than tingling), therefore I have a low index of suspicion for stroke.  Additionally the patient's symptoms are minor and nondisabling and therefore the risks of tPA outweigh the benefits.  However given her malignancy history, recurrent tingling in just one part of the body could represent small focal seizures from a lesion too small to see on head CT.  Therefore will obtain MRI brain with and without contrast.  Pinched nerve  in the spine is also a possibility and therefore will obtain cervical spinal imaging as well.    Recommendations:  - MRI brain w/ and w/o contrast - MRI c-spine w/ and w/o contrast - If these studies are negative, patient may be discharged with outpatient follow-up with neurology.  This patient is receiving care for possible acute neurological changes. There was 50 minutes of care by this provider at the time of service, including time for direct evaluation via telemedicine, review of medical records, imaging studies and discussion of findings with providers, the patient and/or family.  Lesleigh Noe MD-PhD [facility] [phone]   If 8pm- 8am, please page neurology on call as listed in [place].

## 2020-10-19 NOTE — ED Notes (Signed)
Left arm numbness. Speech clear. Pt endorses symptoms have been coming and going prior to 1300 today. Pt reports symptoms started today

## 2020-10-19 NOTE — ED Notes (Signed)
Lying in bed at this time talking with daughter.  Reports all symptoms have resolved.

## 2020-10-19 NOTE — ED Notes (Signed)
Pt returned from CT. Stroke cart in room, alert to Neuro.

## 2020-10-19 NOTE — ED Notes (Signed)
Pt family member at bedside, endorses breast cancer, denies blood thinners. Pt has a power port.

## 2020-10-19 NOTE — ED Notes (Signed)
ED Provider at bedside. 

## 2020-10-19 NOTE — ED Notes (Signed)
Pt on monitor and vitals cycling 

## 2020-10-19 NOTE — ED Notes (Signed)
Patient transported to CT 

## 2020-10-21 ENCOUNTER — Encounter: Payer: Self-pay | Admitting: Neurology

## 2020-11-14 ENCOUNTER — Inpatient Hospital Stay: Payer: Medicare Other

## 2020-11-15 ENCOUNTER — Inpatient Hospital Stay: Payer: Medicare Other | Attending: Hematology & Oncology

## 2020-11-15 ENCOUNTER — Other Ambulatory Visit: Payer: Self-pay

## 2020-11-15 VITALS — BP 183/80 | HR 74 | Resp 18

## 2020-11-15 DIAGNOSIS — Z171 Estrogen receptor negative status [ER-]: Secondary | ICD-10-CM | POA: Insufficient documentation

## 2020-11-15 DIAGNOSIS — Z923 Personal history of irradiation: Secondary | ICD-10-CM | POA: Insufficient documentation

## 2020-11-15 DIAGNOSIS — Z452 Encounter for adjustment and management of vascular access device: Secondary | ICD-10-CM | POA: Diagnosis not present

## 2020-11-15 DIAGNOSIS — Z170 Estrogen receptor positive status [ER+]: Secondary | ICD-10-CM

## 2020-11-15 DIAGNOSIS — Z86000 Personal history of in-situ neoplasm of breast: Secondary | ICD-10-CM | POA: Diagnosis not present

## 2020-11-15 DIAGNOSIS — C50212 Malignant neoplasm of upper-inner quadrant of left female breast: Secondary | ICD-10-CM

## 2020-11-15 DIAGNOSIS — Z9221 Personal history of antineoplastic chemotherapy: Secondary | ICD-10-CM | POA: Diagnosis not present

## 2020-11-15 MED ORDER — SODIUM CHLORIDE 0.9% FLUSH
10.0000 mL | INTRAVENOUS | Status: DC | PRN
  Administered 2020-11-15: 10 mL
  Filled 2020-11-15: qty 10

## 2020-11-15 MED ORDER — HEPARIN SOD (PORK) LOCK FLUSH 100 UNIT/ML IV SOLN
500.0000 [IU] | Freq: Once | INTRAVENOUS | Status: AC | PRN
  Administered 2020-11-15: 500 [IU]
  Filled 2020-11-15: qty 5

## 2020-11-15 NOTE — Patient Instructions (Signed)

## 2020-11-28 ENCOUNTER — Ambulatory Visit: Payer: Medicare Other | Admitting: Neurology

## 2020-11-28 ENCOUNTER — Other Ambulatory Visit: Payer: Self-pay

## 2020-11-28 ENCOUNTER — Other Ambulatory Visit (INDEPENDENT_AMBULATORY_CARE_PROVIDER_SITE_OTHER): Payer: Medicare Other

## 2020-11-28 ENCOUNTER — Encounter: Payer: Self-pay | Admitting: Neurology

## 2020-11-28 VITALS — BP 160/84 | HR 78 | Resp 20 | Ht 63.0 in | Wt 180.0 lb

## 2020-11-28 DIAGNOSIS — R202 Paresthesia of skin: Secondary | ICD-10-CM

## 2020-11-28 LAB — VITAMIN B12: Vitamin B-12: 604 pg/mL (ref 211–911)

## 2020-11-28 NOTE — Progress Notes (Signed)
[facility] Clinic Note - Initial Visit   Date: 11/28/20  Jessica [name] MRN: [id number] DOB: 09-21-1941   Dear Dr. Shelia Media:  Thank you for your kind referral of Jessica [name] for consultation of numbness/tingling. Although her history is well known to you, please allow [place] to reiterate it for the purpose of our medical record. The patient was accompanied to the clinic by daughter who also provides collateral information.     History of Present Illness: Jessica [name] is a 80 y.o. female with left breast cancer s/p chemotherapy and radiation (2018), hypertension, hyperlipidemia, asthma, hypothyroidism, and anxiety presenting for evaluation of left sided numbness/tingling.  She was evaluated in the ER on 12/18 for acute onset of left face, arm, and leg numbness/tingling which lasted 30-min.   MRI brain did not show acute stroke, there was evidence of old right basal ganglia stroke.  MRI cervical spine with mild spondylosis, no nerve impingement.  Patient reports having about 3-4 similar spells lasting 20-30 min over the past year. No associated weakness, change in vision, imbalance, or headache.  Since December, these symptoms have not recurred.   She lives alone in a one-level apartment.  Daughter lives within walking distance.  She manages her own IADLs, including finances, medications, and driving.     Out-side paper records, electronic medical record, and images have been reviewed where available and summarized as:  MRI cervical spine wwo contrast 10/19/2020:   No cause of the presenting symptoms is identified. Mild degenerative spondylosis at C4-5, C5-6 and C6-7 with mild bilateral foraminal narrowing at C5-6 and C6-7, but without distinct neural compression.  MRI brain wwo contrast 10/19/2020: No acute finding by MRI. Old infarction in the right basal ganglia and adjacent white matter tracks. Minimal small vessel change of the hemispheric white  matter.   Lab Results  Component Value Date   TSH 1.049 11/13/2019   No results found for: ESRSEDRATE, POCTSEDRATE  Past Medical History:  Diagnosis Date  . Allergy   . Anemia   . Anxiety   . Asthma    in past, no inhalers now  . Blood transfusion without reported diagnosis   . Breast cancer ([place]) 01/2017   left breast   . Breast cancer of upper-inner quadrant of left female breast ([place]) 03/11/2017  . Cancer ([place]) 01/2017   left breast  . Cataract    bilateral removed  . Chronic kidney disease    kidney stones  . Depression 08/31/2013  . Dyslipidemia, goal LDL below 160 08/31/2013  . Essential hypertension - well controlled 08/31/2013  . Family history of breast cancer   . Family history of melanoma   . Family history of prostate cancer   . GERD (gastroesophageal reflux disease)   . Goals of care, counseling/discussion 03/11/2017  . History of radiation therapy 10/13/17-11/11/17   left breast 40.05 Gy in 15 fractions, left breast boost 10 Gy in 5 fractions  . Hypothyroidism   . Obesity (BMI 30-39.9) 08/31/2013  . Personal history of chemotherapy 2018  . Personal history of radiation therapy 2019  . Thyroid disease   . Vasovagal near-syncope -rare 08/31/2013    Past Surgical History:  Procedure Laterality Date  . ABDOMINAL HYSTERECTOMY     total  . BREAST BIOPSY Left 02/24/2017    malignant  . BREAST LUMPECTOMY Left 03/23/2017  . broken fingers    . CATARACT EXTRACTION Bilateral   . COLONOSCOPY    . EXCISIONAL HEMORRHOIDECTOMY    .  IR FLUORO GUIDE PORT INSERTION RIGHT  08/04/2017  . IR REMOVAL TUN ACCESS W/ PORT W/O FL MOD SED  08/04/2017  . IR US GUIDE VASC ACCESS RIGHT  08/04/2017  . IRRIGATION AND DEBRIDEMENT ABSCESS Left 05/18/2017   Procedure: IRRIGATION AND DEBRIDEMENT LEFT AXILLARY ABSCESS;  Surgeon: Michael Boston, MD;  Location: [facility];  Service: General;  Laterality: Left;  . kidney stones lithotripsy    . PORTACATH PLACEMENT Right 03/23/2017   Procedure:  INSERTION PORT-A-CATH;  Surgeon: Erroll Luna, MD;  Location: [facility];  Service: General;  Laterality: Right;  . RADIOACTIVE SEED GUIDED PARTIAL MASTECTOMY WITH AXILLARY SENTINEL LYMPH NODE BIOPSY Left 03/23/2017   Procedure: LEFT BREAST RADIOACTIVE SEED GUIDED PARTIAL MASTECTOMY AND  SENTINEL LYMPH NODE MAPPING;  Surgeon: Erroll Luna, MD;  Location: [facility];  Service: General;  Laterality: Left;  . ROTATOR CUFF REPAIR     left   . TONSILLECTOMY    . WISDOM TOOTH EXTRACTION       Medications:  Outpatient Encounter Medications as of 11/28/2020  Medication Sig Note  . buPROPion (WELLBUTRIN XL) 150 MG 24 hr tablet Take 150 mg by mouth every morning.   . diphenhydramine-acetaminophen (TYLENOL PM) 25-500 MG TABS Take 1 tablet by mouth at bedtime as needed (sleep).    . GENERLAC 10 GM/15ML SOLN TAKE 30 ML BY MOUTH EVERY 4 HOURS AS NEEDED FOR MILD CONSTIPATION UNTIL BOWEL MOVEMENT   . hydrochlorothiazide (MICROZIDE) 12.5 MG capsule Take 12.5 mg by mouth daily.   Marland Kitchen levothyroxine (SYNTHROID, LEVOTHROID) 75 MCG tablet Take 75 mcg by mouth daily.   Marland Kitchen Lysine 1000 MG TABS Take 1,000 mg by mouth as needed.   . Multiple Vitamin (MULTIVITAMIN) tablet Take 1 tablet by mouth daily. 05/17/2017: Not taking regularly   . omeprazole (PRILOSEC) 20 MG capsule    . polyvinyl alcohol (LIQUIFILM TEARS) 1.4 % ophthalmic solution 1 drop as needed for dry eyes.   . potassium chloride SA (K-DUR,KLOR-CON) 20 MEQ tablet Take 2 tablets (40 mEq total) daily by mouth.   . venlafaxine XR (EFFEXOR-XR) 75 MG 24 hr capsule Take 225 mg by mouth daily.   Marland Kitchen lidocaine-prilocaine (EMLA) cream Apply 1 application topically as needed.   Marland Kitchen OVER THE COUNTER MEDICATION Take 15 mLs by mouth 2 (two) times daily as needed (pain). 05/17/2017: Pt using OTC mouth rinse similar to biotene. Cannot recall name of medication    No facility-administered encounter medications on file as of 11/28/2020.     Allergies:  Allergies  Allergen Reactions  . Advil [Ibuprofen] Hives and Itching  . Codeine Hives  . Penicillins Hives    Has patient had a PCN reaction causing immediate rash, facial/tongue/throat swelling, SOB or lightheadedness with hypotension: yes Has patient had a PCN reaction causing severe rash involving mucus membranes or skin necrosis:no Has patient had a PCN reaction that required hospitalization:no Has patient had a PCN reaction occurring within the last 10 years: no If all of the above answers are "NO", then may proceed with Cephalosporin use. Has patient had a PCN reaction causing immediate rash, facial/tongue/throat swelling, SOB or lightheadedness with hypotension: yes Has patient had a PCN reaction causing severe rash involving mucus membranes or skin necrosis:  no Has patient had a PCN reaction that required hospitalization:  no Has patient had a PCN reaction occurring within the last 10 years: no If all of the above answers are "NO", then may proceed with Cephalosporin use.   . Band-Aid Plus  Antibiotic [Bacitracin-Polymyxin B] Dermatitis  . Benazepril Swelling    Possible cause of tongue swelling  . Other Rash    Band-aid    Family History: Family History  Problem Relation Age of Onset  . Heart disease Mother        Angina  . Heart disease Father   . Cervical cancer Maternal Aunt   . Heart attack Brother        Died of MI in his mid 80s  . Prostate cancer Brother        dx in his early 52s  . Breast cancer Paternal Aunt        dx over 18  . Bone cancer Cousin        maternal first cousin dx in her 53s-60s  . Lung cancer Cousin        paternal first cousin  . Colon cancer Neg Hx   . Pancreatic cancer Neg Hx   . Stomach cancer Neg Hx   . Esophageal cancer Neg Hx   . Rectal cancer Neg Hx     Social History: Social History   Tobacco Use  . Smoking status: Never Smoker  . Smokeless tobacco: Never Used  Vaping Use  . Vaping Use: Never used   Substance Use Topics  . Alcohol use: No  . Drug use: No   Social History   Social History Narrative   Married mother of 3 with one grandchild. Never smoked. Does not drink alcohol.   Walks 3-4 days a week for roughly 20-30 minutes.    Vital Signs:  BP (!) 160/84   Pulse 78   Resp 20   Ht 5\' 3"  (1.6 m)   Wt 180 lb (81.6 kg)   SpO2 98%   BMI 31.89 kg/m    General Medical Exam:   General:  Well appearing, comfortable.   Eyes/ENT: see cranial nerve examination.   Neck:   No carotid bruits. Respiratory:  Clear to auscultation, good air entry bilaterally.   Cardiac:  Regular rate and rhythm, no murmur.   Extremities:  No deformities, edema, or skin discoloration.  Skin:  No rashes or lesions.  Neurological Exam: MENTAL STATUS including orientation to time, place, person, recent and remote memory, attention span and concentration, language, and fund of knowledge is normal.  Speech is not dysarthric.  CRANIAL NERVES: II:  No visual field defects.   III-IV-VI: Pupils equal round and reactive to light.  Normal conjugate, extra-ocular eye movements in all directions of gaze.  No nystagmus.  No ptosis.   V:  Normal facial sensation.    VII:  Normal facial symmetry and movements.   VIII:  Normal hearing and vestibular function.   IX-X:  Normal palatal movement.   XI:  Normal shoulder shrug and head rotation.   XII:  Normal tongue strength and range of motion, no deviation or fasciculation.  MOTOR:  No atrophy, fasciculations or abnormal movements.  No pronator drift.   Upper Extremity:  Right  Left  Deltoid  5/5   5/5   Biceps  5/5   5/5   Triceps  5/5   5/5   Infraspinatus 5/5  5/5  Medial pectoralis 5/5  5/5  Wrist extensors  5/5   5/5   Wrist flexors  5/5   5/5   Finger extensors  5/5   5/5   Finger flexors  5/5   5/5   Dorsal interossei  5/5   5/5   Abductor pollicis  5/5   5/5   Tone (Ashworth scale)  0  0   Lower Extremity:  Right  Left  Hip flexors  5/5   5/5    Hip extensors  5/5   5/5   Adductor 5/5  5/5  Abductor 5/5  5/5  Knee flexors  5/5   5/5   Knee extensors  5/5   5/5   Dorsiflexors  5/5   5/5   Plantarflexors  5/5   5/5   Toe extensors  5/5   5/5   Toe flexors  5/5   5/5   Tone (Ashworth scale)  0  0   MSRs:  Right        Left                  brachioradialis 2+  2+  biceps 2+  2+  triceps 2+  2+  patellar 2+  2+  ankle jerk 2+  2+  Hoffman no  no  plantar response down  down   SENSORY:  Normal and symmetric perception of light touch, pinprick, vibration, and proprioception.     COORDINATION/GAIT: Normal finger-to- nose-finger and heel-to-shin.  Intact rapid alternating movements bilaterally.  Able to rise from a chair without using arms.  Gait narrow based and stable. Tandem and stressed gait intact.    IMPRESSION: Transient left sided paresthesias, no evidence of stroke.  Her neurological exam is also remarkably normal.   Unlike to represent seizure as symptoms are too long.  Vascular risk factors include age, hypertension, and hyperlipidemia.  Discussed that she may have atypical migraine variant manifesting with pure sensory changes vs TIA.  To risk stratify, I offered US carotids, but she prefers to wait on doing additional testing at this time. She is agreeable to having her vitamin B12 level checked.  Should symptoms return, she will contact my office.    Thank you for allowing me to participate in patient's care.  If I can answer any additional questions, I would be pleased to do so.    Sincerely,    Sammuel Blick K. Posey Pronto, DO

## 2020-11-28 NOTE — Patient Instructions (Addendum)
1.  Check labs 2.  If your symptoms return, please contact my office and we can check an ultrasound of your carotids  Return to clinic as needed  Your provider has requested that you have labwork completed today. Please go to [facility] ([id number]) on the second floor of this building before leaving the office today. You do not need to check in. If you are not called within 15 minutes please check with the front desk.

## 2020-12-25 DIAGNOSIS — Z20822 Contact with and (suspected) exposure to covid-19: Secondary | ICD-10-CM | POA: Diagnosis not present

## 2020-12-25 DIAGNOSIS — Z03818 Encounter for observation for suspected exposure to other biological agents ruled out: Secondary | ICD-10-CM | POA: Diagnosis not present

## 2021-01-13 ENCOUNTER — Other Ambulatory Visit: Payer: Self-pay

## 2021-01-13 ENCOUNTER — Inpatient Hospital Stay: Payer: Medicare Other | Attending: Hematology & Oncology

## 2021-01-13 VITALS — BP 152/72 | HR 93 | Temp 98.4°F | Resp 18

## 2021-01-13 DIAGNOSIS — Z86000 Personal history of in-situ neoplasm of breast: Secondary | ICD-10-CM | POA: Diagnosis not present

## 2021-01-13 DIAGNOSIS — Z95828 Presence of other vascular implants and grafts: Secondary | ICD-10-CM

## 2021-01-13 DIAGNOSIS — Z452 Encounter for adjustment and management of vascular access device: Secondary | ICD-10-CM | POA: Insufficient documentation

## 2021-01-13 MED ORDER — SODIUM CHLORIDE 0.9% FLUSH
10.0000 mL | INTRAVENOUS | Status: DC | PRN
  Administered 2021-01-13: 10 mL via INTRAVENOUS
  Filled 2021-01-13: qty 10

## 2021-01-13 MED ORDER — HEPARIN SOD (PORK) LOCK FLUSH 100 UNIT/ML IV SOLN
500.0000 [IU] | Freq: Once | INTRAVENOUS | Status: AC
  Administered 2021-01-13: 500 [IU] via INTRAVENOUS
  Filled 2021-01-13: qty 5

## 2021-01-16 ENCOUNTER — Ambulatory Visit: Payer: Medicare Other | Admitting: Neurology

## 2021-01-27 DIAGNOSIS — I10 Essential (primary) hypertension: Secondary | ICD-10-CM | POA: Diagnosis not present

## 2021-01-27 DIAGNOSIS — E559 Vitamin D deficiency, unspecified: Secondary | ICD-10-CM | POA: Diagnosis not present

## 2021-01-27 DIAGNOSIS — N390 Urinary tract infection, site not specified: Secondary | ICD-10-CM | POA: Diagnosis not present

## 2021-01-27 DIAGNOSIS — E7800 Pure hypercholesterolemia, unspecified: Secondary | ICD-10-CM | POA: Diagnosis not present

## 2021-02-04 DIAGNOSIS — Z853 Personal history of malignant neoplasm of breast: Secondary | ICD-10-CM | POA: Diagnosis not present

## 2021-02-04 DIAGNOSIS — N390 Urinary tract infection, site not specified: Secondary | ICD-10-CM | POA: Diagnosis not present

## 2021-02-04 DIAGNOSIS — Z0000 Encounter for general adult medical examination without abnormal findings: Secondary | ICD-10-CM | POA: Diagnosis not present

## 2021-02-04 DIAGNOSIS — E7800 Pure hypercholesterolemia, unspecified: Secondary | ICD-10-CM | POA: Diagnosis not present

## 2021-03-06 ENCOUNTER — Other Ambulatory Visit: Payer: Self-pay | Admitting: Hematology & Oncology

## 2021-03-06 DIAGNOSIS — Z853 Personal history of malignant neoplasm of breast: Secondary | ICD-10-CM

## 2021-03-14 ENCOUNTER — Telehealth: Payer: Self-pay | Admitting: *Deleted

## 2021-03-14 ENCOUNTER — Inpatient Hospital Stay: Payer: Medicare Other | Attending: Hematology & Oncology

## 2021-03-14 ENCOUNTER — Inpatient Hospital Stay: Payer: Medicare Other

## 2021-03-14 ENCOUNTER — Inpatient Hospital Stay (HOSPITAL_BASED_OUTPATIENT_CLINIC_OR_DEPARTMENT_OTHER): Payer: Medicare Other | Admitting: Hematology & Oncology

## 2021-03-14 ENCOUNTER — Other Ambulatory Visit: Payer: Self-pay

## 2021-03-14 ENCOUNTER — Encounter: Payer: Self-pay | Admitting: Hematology & Oncology

## 2021-03-14 VITALS — BP 138/70 | HR 77 | Temp 98.6°F | Resp 20 | Wt 168.0 lb

## 2021-03-14 DIAGNOSIS — Z79899 Other long term (current) drug therapy: Secondary | ICD-10-CM | POA: Diagnosis not present

## 2021-03-14 DIAGNOSIS — Z170 Estrogen receptor positive status [ER+]: Secondary | ICD-10-CM

## 2021-03-14 DIAGNOSIS — E038 Other specified hypothyroidism: Secondary | ICD-10-CM | POA: Diagnosis not present

## 2021-03-14 DIAGNOSIS — Z9221 Personal history of antineoplastic chemotherapy: Secondary | ICD-10-CM | POA: Diagnosis not present

## 2021-03-14 DIAGNOSIS — E876 Hypokalemia: Secondary | ICD-10-CM | POA: Insufficient documentation

## 2021-03-14 DIAGNOSIS — R5383 Other fatigue: Secondary | ICD-10-CM | POA: Insufficient documentation

## 2021-03-14 DIAGNOSIS — Z171 Estrogen receptor negative status [ER-]: Secondary | ICD-10-CM | POA: Insufficient documentation

## 2021-03-14 DIAGNOSIS — C50212 Malignant neoplasm of upper-inner quadrant of left female breast: Secondary | ICD-10-CM

## 2021-03-14 DIAGNOSIS — Z853 Personal history of malignant neoplasm of breast: Secondary | ICD-10-CM | POA: Diagnosis not present

## 2021-03-14 DIAGNOSIS — Z923 Personal history of irradiation: Secondary | ICD-10-CM | POA: Diagnosis not present

## 2021-03-14 LAB — CMP (CANCER CENTER ONLY)
ALT: 12 U/L (ref 0–44)
AST: 15 U/L (ref 15–41)
Albumin: 4 g/dL (ref 3.5–5.0)
Alkaline Phosphatase: 53 U/L (ref 38–126)
Anion gap: 7 (ref 5–15)
BUN: 16 mg/dL (ref 8–23)
CO2: 30 mmol/L (ref 22–32)
Calcium: 9.5 mg/dL (ref 8.9–10.3)
Chloride: 101 mmol/L (ref 98–111)
Creatinine: 0.75 mg/dL (ref 0.44–1.00)
GFR, Estimated: >60 mL/min (ref >60)
Glucose, Bld: 132 mg/dL — ABNORMAL HIGH (ref 70–99)
Potassium: 3.2 mmol/L — ABNORMAL LOW (ref 3.5–5.1)
Sodium: 138 mmol/L (ref 135–145)
Total Bilirubin: 0.3 mg/dL (ref 0.3–1.2)
Total Protein: 6.4 g/dL — ABNORMAL LOW (ref 6.5–8.1)

## 2021-03-14 LAB — CBC WITH DIFFERENTIAL (CANCER CENTER ONLY)
Abs Immature Granulocytes: 0.02 10*3/uL (ref 0.00–0.07)
Basophils Absolute: 0 10*3/uL (ref 0.0–0.1)
Basophils Relative: 0 %
Eosinophils Absolute: 0.2 10*3/uL (ref 0.0–0.5)
Eosinophils Relative: 5 %
HCT: 34.7 % — ABNORMAL LOW (ref 36.0–46.0)
Hemoglobin: 11.5 g/dL — ABNORMAL LOW (ref 12.0–15.0)
Immature Granulocytes: 0 %
Lymphocytes Relative: 25 %
Lymphs Abs: 1.3 10*3/uL (ref 0.7–4.0)
MCH: 32.2 pg (ref 26.0–34.0)
MCHC: 33.1 g/dL (ref 30.0–36.0)
MCV: 97.2 fL (ref 80.0–100.0)
Monocytes Absolute: 0.3 10*3/uL (ref 0.1–1.0)
Monocytes Relative: 7 %
Neutro Abs: 3.2 10*3/uL (ref 1.7–7.7)
Neutrophils Relative %: 63 %
Platelet Count: 218 10*3/uL (ref 150–400)
RBC: 3.57 MIL/uL — ABNORMAL LOW (ref 3.87–5.11)
RDW: 13.2 % (ref 11.5–15.5)
WBC Count: 5.1 10*3/uL (ref 4.0–10.5)
nRBC: 0 % (ref 0.0–0.2)

## 2021-03-14 LAB — LACTATE DEHYDROGENASE: LDH: 170 U/L (ref 98–192)

## 2021-03-14 MED ORDER — HEPARIN SOD (PORK) LOCK FLUSH 100 UNIT/ML IV SOLN
500.0000 [IU] | Freq: Once | INTRAVENOUS | Status: AC | PRN
  Administered 2021-03-14: 500 [IU]
  Filled 2021-03-14: qty 5

## 2021-03-14 MED ORDER — POTASSIUM CHLORIDE CRYS ER 20 MEQ PO TBCR: 20.0000 meq | EXTENDED_RELEASE_TABLET | Freq: Every day | ORAL | 0 refills | Status: DC

## 2021-03-14 MED ORDER — SODIUM CHLORIDE 0.9% FLUSH
10.0000 mL | INTRAVENOUS | Status: DC | PRN
  Administered 2021-03-14: 10 mL
  Filled 2021-03-14: qty 10

## 2021-03-14 NOTE — Patient Instructions (Signed)

## 2021-03-14 NOTE — Progress Notes (Signed)
Hematology and Oncology Follow Up Visit  Jessica [name] [id number] [birth year] 80 y.o. 03/14/2021   Principle Diagnosis:  Stage IIA (T2bN0M0) invasive ductal ca of the LEFT breast - TRIPLE NEGATIVE Staphylococcus aureus wound infection of the LEFT axilla Cellulitis of the left breast  Past Therapy:   Taxotere/Carboplatin - s/p cycle #4 -completed adjuvant therapy on 09/09/2017 Radiation therapy to the right breast.-Completed 4005 rad with an additional 1000 rad boost on 11/11/2017  Current Therapy: Observation   Interim History:  Jessica [name] is here today for follow-up.  We saw her 6 months ago.  She comes in with her daughter.  She just does not feel that great.  She just feels tired.  I am unsure as to why she does feel tired.  Her potassium is a little bit on the low side.  She would like to have some extra potassium.  I will see what we can do about this.  The big news is that she and her 2 daughters are going to [place] on a cruise.  This will be in September.  I am so happy for her.  I told her that when we see her back in November, we really need to see some pictures of her in [place].  Otherwise, she seems to be doing okay.  She has had no problems with cough.  There is been no COVID issues.  She has had no change in bowel or bladder habits.  She has had no rashes.  There is been no leg swelling.  Overall, her performance status is ECOG 2.    Medications:  Allergies as of 03/14/2021      Reactions   Advil [ibuprofen] Hives, Itching   Codeine Hives   Penicillins Hives   Has patient had a PCN reaction causing immediate rash, facial/tongue/throat swelling, SOB or lightheadedness with hypotension: yes Has patient had a PCN reaction causing severe rash involving mucus membranes or skin necrosis:no Has patient had a PCN reaction that required hospitalization:no Has patient had a PCN reaction occurring within the last 10 years: no If all of the above answers are "NO", then  may proceed with Cephalosporin use. Has patient had a PCN reaction causing immediate rash, facial/tongue/throat swelling, SOB or lightheadedness with hypotension: yes Has patient had a PCN reaction causing severe rash involving mucus membranes or skin necrosis:  no Has patient had a PCN reaction that required hospitalization:  no Has patient had a PCN reaction occurring within the last 10 years: no If all of the above answers are "NO", then may proceed with Cephalosporin use.   Band-aid Plus Antibiotic [bacitracin-polymyxin B] Dermatitis   Benazepril Swelling   Possible cause of tongue swelling   Other Rash   Band-aid      Medication List       Accurate as of Mar 14, 2021  1:07 PM. If you have any questions, ask your nurse or doctor.        STOP taking these medications   buPROPion 150 MG 24 hr tablet Commonly known as: WELLBUTRIN XL Stopped by: [name], MD   diphenhydramine-acetaminophen 25-500 MG Tabs tablet Commonly known as: TYLENOL PM Stopped by: [name], MD   polyvinyl alcohol 1.4 % ophthalmic solution Commonly known as: LIQUIFILM TEARS Stopped by: [name], MD   potassium chloride SA 20 MEQ tablet Commonly known as: KLOR-CON Stopped by: [name], MD     TAKE these medications   diphenhydrAMINE 25 MG tablet Commonly known  as: BENADRYL Take 25 mg by mouth at bedtime as needed.   Generlac 10 GM/15ML Soln Generic drug: lactulose (encephalopathy) TAKE 30 ML BY MOUTH EVERY 4 HOURS AS NEEDED FOR MILD CONSTIPATION UNTIL BOWEL MOVEMENT   hydrochlorothiazide 12.5 MG capsule Commonly known as: MICROZIDE Take 12.5 mg by mouth daily.   levothyroxine 75 MCG tablet Commonly known as: SYNTHROID Take 75 mcg by mouth daily.   lidocaine-prilocaine cream Commonly known as: EMLA Apply 1 application topically as needed.   Lysine 1000 MG Tabs Take 1,000 mg by mouth as needed.   multivitamin tablet Take 1 tablet by mouth daily.   OCUVITE  ADULT FORMULA PO Take by mouth daily.   omeprazole 20 MG capsule Commonly known as: PRILOSEC 2 (two) times daily before a meal.   OVER THE COUNTER MEDICATION Take 15 mLs by mouth 2 (two) times daily as needed (pain).   OVER THE COUNTER MEDICATION daily. Memory Medication   venlafaxine XR 75 MG 24 hr capsule Commonly known as: EFFEXOR-XR Take 225 mg by mouth daily.       Allergies:  Allergies  Allergen Reactions  . Advil [Ibuprofen] Hives and Itching  . Codeine Hives  . Penicillins Hives    Has patient had a PCN reaction causing immediate rash, facial/tongue/throat swelling, SOB or lightheadedness with hypotension: yes Has patient had a PCN reaction causing severe rash involving mucus membranes or skin necrosis:no Has patient had a PCN reaction that required hospitalization:no Has patient had a PCN reaction occurring within the last 10 years: no If all of the above answers are "NO", then may proceed with Cephalosporin use. Has patient had a PCN reaction causing immediate rash, facial/tongue/throat swelling, SOB or lightheadedness with hypotension: yes Has patient had a PCN reaction causing severe rash involving mucus membranes or skin necrosis:  no Has patient had a PCN reaction that required hospitalization:  no Has patient had a PCN reaction occurring within the last 10 years: no If all of the above answers are "NO", then may proceed with Cephalosporin use.   . Band-Aid Plus Antibiotic [Bacitracin-Polymyxin B] Dermatitis  . Benazepril Swelling    Possible cause of tongue swelling  . Other Rash    Band-aid    Past Medical History, Surgical history, Social history, and Family History were reviewed and updated.  Review of Systems: Review of Systems  Constitutional: Negative.   HENT: Negative.   Eyes: Negative.   Respiratory: Negative.   Cardiovascular: Negative.   Gastrointestinal: Negative.   Genitourinary: Negative.   Musculoskeletal: Negative.   Skin:  Negative.   Neurological: Negative.   Endo/Heme/Allergies: Negative.   Psychiatric/Behavioral: Negative.      Physical Exam:  weight is 168 lb (76.2 kg). Her oral temperature is 98.6 F (37 C). Her blood pressure is 138/70 and her pulse is 77. Her respiration is 20 and oxygen saturation is 97%.   Wt Readings from Last 3 Encounters:  03/14/21 168 lb (76.2 kg)  11/28/20 180 lb (81.6 kg)  10/19/20 177 lb 7.5 oz (80.5 kg)  Vital signs were taken.  Temperature 98.4.  Pulse 96.  Blood pressure 125/70.  Weight is 175 pounds.  Physical Exam Vitals reviewed.  Constitutional:      Comments: Her breast exam shows right breast with no masses, edema or erythema.  There is no right axillary adenopathy.  Left breast shows the lumpectomy that is well-healed.  She has some contraction of the left breast.  There are some firmness at the lumpectomy site secondary  to prior infection that required secondary intention.  There is no distinct mass in the left breast.  There is no left axillary adenopathy.  There is some telangiectasias on the left breast from radiation.  HENT:     Head: Normocephalic and atraumatic.  Eyes:     Pupils: Pupils are equal, round, and reactive to light.  Cardiovascular:     Rate and Rhythm: Normal rate and regular rhythm.     Heart sounds: Normal heart sounds.  Pulmonary:     Effort: Pulmonary effort is normal.     Breath sounds: Normal breath sounds.  Abdominal:     General: Bowel sounds are normal.     Palpations: Abdomen is soft.  Musculoskeletal:        General: No tenderness or deformity. Normal range of motion.     Cervical back: Normal range of motion.  Lymphadenopathy:     Cervical: No cervical adenopathy.  Skin:    General: Skin is warm and dry.     Findings: No erythema or rash.  Neurological:     Mental Status: She is alert and oriented to person, place, and time.  Psychiatric:        Behavior: Behavior normal.        Thought Content: Thought content  normal.        Judgment: Judgment normal.      Lab Results  Component Value Date   WBC 5.1 03/14/2021   HGB 11.5 (L) 03/14/2021   HCT 34.7 (L) 03/14/2021   MCV 97.2 03/14/2021   PLT 218 03/14/2021   Lab Results  Component Value Date   FERRITIN 24 03/14/2020   IRON 108 03/14/2020   TIBC 363 03/14/2020   UIBC 254 03/14/2020   IRONPCTSAT 30 03/14/2020   Lab Results  Component Value Date   RBC 3.57 (L) 03/14/2021   No results found for: KPAFRELGTCHN, LAMBDASER, KAPLAMBRATIO No results found for: IGGSERUM, IGA, IGMSERUM No results found for: Odetta Pink, SPEI   Chemistry      Component Value Date/Time   NA 138 03/14/2021 1149   NA 143 10/08/2017 1042   NA 141 06/16/2017 1014   K 3.2 (L) 03/14/2021 1149   K 3.4 10/08/2017 1042   K 3.8 06/16/2017 1014   CL 101 03/14/2021 1149   CL 100 10/08/2017 1042   CO2 30 03/14/2021 1149   CO2 30 10/08/2017 1042   CO2 31 (H) 06/16/2017 1014   BUN 16 03/14/2021 1149   BUN 10 10/08/2017 1042   BUN 8.9 06/16/2017 1014   CREATININE 0.75 03/14/2021 1149   CREATININE 0.9 10/08/2017 1042   CREATININE 0.8 06/16/2017 1014      Component Value Date/Time   CALCIUM 9.5 03/14/2021 1149   CALCIUM 9.5 10/08/2017 1042   CALCIUM 10.1 06/16/2017 1014   ALKPHOS 53 03/14/2021 1149   ALKPHOS 68 10/08/2017 1042   ALKPHOS 64 06/16/2017 1014   AST 15 03/14/2021 1149   AST 21 06/16/2017 1014   ALT 12 03/14/2021 1149   ALT 20 10/08/2017 1042   ALT 20 06/16/2017 1014   BILITOT 0.3 03/14/2021 1149   BILITOT 0.37 06/16/2017 1014      Impression and Plan: Jessica [name] is a very pleasant 80 yo caucasian female with stage IIA ductal carcinoma of the left breast.  Her breast cancer is TRIPLE NEGATIVE.    She had her partial mastectomy in May 2018 followed by chemotherapy and radiation.  She  completed chemotherapy in November 2018.  She then had radiation and completed radiation in January  2019.  Right now, I do not see any evidence of recurrent disease.   I just wish she would feel little bit better.  Again maybe the potassium will help.  Again I look forward to seeing pictures of her up in [place].     [name], MD 5/13/20221:07 PM

## 2021-03-14 NOTE — Telephone Encounter (Signed)
Per 03/14/21 los gave upcoming appointments with calendar 

## 2021-03-20 ENCOUNTER — Other Ambulatory Visit: Payer: Self-pay

## 2021-03-20 ENCOUNTER — Ambulatory Visit
Admission: RE | Admit: 2021-03-20 | Discharge: 2021-03-20 | Disposition: A | Discharge status: Home / Self Care | Payer: Medicare Other | Source: Ambulatory Visit | Attending: Hematology & Oncology | Admitting: Hematology & Oncology

## 2021-03-20 DIAGNOSIS — R922 Inconclusive mammogram: Secondary | ICD-10-CM | POA: Diagnosis not present

## 2021-03-20 DIAGNOSIS — Z853 Personal history of malignant neoplasm of breast: Secondary | ICD-10-CM | POA: Diagnosis not present

## 2021-03-28 DIAGNOSIS — N3000 Acute cystitis without hematuria: Secondary | ICD-10-CM | POA: Diagnosis not present

## 2021-05-14 ENCOUNTER — Inpatient Hospital Stay: Payer: Medicare Other | Attending: Hematology & Oncology

## 2021-05-19 ENCOUNTER — Encounter (HOSPITAL_BASED_OUTPATIENT_CLINIC_OR_DEPARTMENT_OTHER): Payer: Self-pay

## 2021-05-19 ENCOUNTER — Other Ambulatory Visit: Payer: Self-pay

## 2021-05-19 ENCOUNTER — Emergency Department (HOSPITAL_BASED_OUTPATIENT_CLINIC_OR_DEPARTMENT_OTHER)
Admission: EM | Admit: 2021-05-19 | Discharge: 2021-05-20 | Disposition: A | Discharge status: Home / Self Care | Payer: Medicare Other | Attending: Emergency Medicine | Admitting: Emergency Medicine

## 2021-05-19 DIAGNOSIS — Z853 Personal history of malignant neoplasm of breast: Secondary | ICD-10-CM | POA: Insufficient documentation

## 2021-05-19 DIAGNOSIS — Z79899 Other long term (current) drug therapy: Secondary | ICD-10-CM | POA: Diagnosis not present

## 2021-05-19 DIAGNOSIS — R4182 Altered mental status, unspecified: Secondary | ICD-10-CM | POA: Diagnosis present

## 2021-05-19 DIAGNOSIS — N3000 Acute cystitis without hematuria: Secondary | ICD-10-CM | POA: Diagnosis not present

## 2021-05-19 DIAGNOSIS — E039 Hypothyroidism, unspecified: Secondary | ICD-10-CM | POA: Diagnosis not present

## 2021-05-19 DIAGNOSIS — I129 Hypertensive chronic kidney disease with stage 1 through stage 4 chronic kidney disease, or unspecified chronic kidney disease: Secondary | ICD-10-CM | POA: Insufficient documentation

## 2021-05-19 DIAGNOSIS — J45909 Unspecified asthma, uncomplicated: Secondary | ICD-10-CM | POA: Insufficient documentation

## 2021-05-19 DIAGNOSIS — N189 Chronic kidney disease, unspecified: Secondary | ICD-10-CM | POA: Insufficient documentation

## 2021-05-19 LAB — CBC
HCT: 38.6 % (ref 36.0–46.0)
Hemoglobin: 12.6 g/dL (ref 12.0–15.0)
MCH: 31.7 pg (ref 26.0–34.0)
MCHC: 32.6 g/dL (ref 30.0–36.0)
MCV: 97.2 fL (ref 80.0–100.0)
Platelets: 288 10*3/uL (ref 150–400)
RBC: 3.97 MIL/uL (ref 3.87–5.11)
RDW: 13.2 % (ref 11.5–15.5)
WBC: 6 10*3/uL (ref 4.0–10.5)
nRBC: 0 % (ref 0.0–0.2)

## 2021-05-19 LAB — COMPREHENSIVE METABOLIC PANEL
ALT: 13 U/L (ref 0–44)
AST: 18 U/L (ref 15–41)
Albumin: 4.3 g/dL (ref 3.5–5.0)
Alkaline Phosphatase: 60 U/L (ref 38–126)
Anion gap: 9 (ref 5–15)
BUN: 11 mg/dL (ref 8–23)
CO2: 30 mmol/L (ref 22–32)
Calcium: 9.4 mg/dL (ref 8.9–10.3)
Chloride: 100 mmol/L (ref 98–111)
Creatinine, Ser: 0.72 mg/dL (ref 0.44–1.00)
GFR, Estimated: >60 mL/min (ref >60)
Glucose, Bld: 145 mg/dL — ABNORMAL HIGH (ref 70–99)
Potassium: 3.3 mmol/L — ABNORMAL LOW (ref 3.5–5.1)
Sodium: 139 mmol/L (ref 135–145)
Total Bilirubin: 0.3 mg/dL (ref 0.3–1.2)
Total Protein: 7.1 g/dL (ref 6.5–8.1)

## 2021-05-19 NOTE — ED Provider Notes (Signed)
[facility] Provider Note   CSN: [id number] Arrival date & time: 05/19/21  1930     History Chief Complaint  Patient presents with   Altered Mental Status    Jessica [name] is a 80 y.o. female.  Patient is a 80 year old female with past medical history of prior UTIs, hypothyroidism, renal calculi, breast cancer.  Patient brought by daughter for evaluation of confusion.  Since yesterday patient has been disoriented at times.  She has apparently been talking about taking care of pets that have since passed away.  She does describe some urinary incontinence and discomfort in the back.  She has had UTIs in the past which have caused similar issues.  She denies any fevers, chills, or abdominal pain.  She denies any headache or weakness/numbness.  The history is provided by the patient.  Altered Mental Status Presenting symptoms: confusion   Severity:  Moderate Most recent episode:  Yesterday Timing:  Intermittent Progression:  Improving Chronicity:  New     Past Medical History:  Diagnosis Date   Allergy    Anemia    Anxiety    Asthma    in past, no inhalers now   Blood transfusion without reported diagnosis    Breast cancer ([place]) 01/2017   left breast    Breast cancer of upper-inner quadrant of left female breast ([place]) 03/11/2017   Cancer ([place]) 01/2017   left breast   Cataract    bilateral removed   Chronic kidney disease    kidney stones   Depression 08/31/2013   Dyslipidemia, goal LDL below 160 08/31/2013   Essential hypertension - well controlled 08/31/2013   Family history of breast cancer    Family history of melanoma    Family history of prostate cancer    GERD (gastroesophageal reflux disease)    Goals of care, counseling/discussion 03/11/2017   History of radiation therapy 10/13/17-11/11/17   left breast 40.05 Gy in 15 fractions, left breast boost 10 Gy in 5 fractions   Hypothyroidism    Obesity (BMI 30-39.9) 08/31/2013    Personal history of chemotherapy 2018   Personal history of radiation therapy 2019   Thyroid disease    Vasovagal near-syncope -rare 08/31/2013    Patient Active Problem List   Diagnosis Date Noted   Insomnia 06/25/2019   Obstructive sleep apnea 06/13/2019   Genetic testing 06/29/2017   Family history of melanoma    Family history of prostate cancer    Family history of breast cancer    Left axillary seroma status post incision and drainage 05/18/2017 05/18/2017   Cellulitis of left axilla 05/17/2017   GERD (gastroesophageal reflux disease) 05/16/2017   Hypothyroidism 05/16/2017   Depression with anxiety 05/16/2017   Breast cancer of upper-inner quadrant of left female breast ([place]) 03/11/2017   Goals of care, counseling/discussion 03/11/2017   Obesity (BMI 30-39.9) 08/31/2013   Essential hypertension - well controlled 08/31/2013    Class: Diagnosis of   Depression 08/31/2013    Class: Diagnosis of   Vasovagal near-syncope -rare 08/31/2013    Class: History of   Dyslipidemia, goal LDL below 160 08/31/2013    Past Surgical History:  Procedure Laterality Date   ABDOMINAL HYSTERECTOMY     total   BREAST BIOPSY Left 02/24/2017    malignant   BREAST LUMPECTOMY Left 03/23/2017   broken fingers     CATARACT EXTRACTION Bilateral    COLONOSCOPY     EXCISIONAL HEMORRHOIDECTOMY     IR FLUORO GUIDE  PORT INSERTION RIGHT  08/04/2017   IR REMOVAL TUN ACCESS W/ PORT W/O FL MOD SED  08/04/2017   IR US GUIDE VASC ACCESS RIGHT  08/04/2017   IRRIGATION AND DEBRIDEMENT ABSCESS Left 05/18/2017   Procedure: IRRIGATION AND DEBRIDEMENT LEFT AXILLARY ABSCESS;  Surgeon: Michael Boston, MD;  Location: [facility];  Service: General;  Laterality: Left;   kidney stones lithotripsy     PORTACATH PLACEMENT Right 03/23/2017   Procedure: INSERTION PORT-A-CATH;  Surgeon: Erroll Luna, MD;  Location: [facility];  Service: General;  Laterality: Right;   RADIOACTIVE SEED GUIDED PARTIAL MASTECTOMY  WITH AXILLARY SENTINEL LYMPH NODE BIOPSY Left 03/23/2017   Procedure: LEFT BREAST RADIOACTIVE SEED GUIDED PARTIAL MASTECTOMY AND  SENTINEL LYMPH NODE [place];  Surgeon: Erroll Luna, MD;  Location: [facility];  Service: General;  Laterality: Left;   ROTATOR CUFF REPAIR     left    TONSILLECTOMY     WISDOM TOOTH EXTRACTION       OB History   No obstetric history on file.     Family History  Problem Relation Age of Onset   Heart disease Mother        Angina   Heart disease Father    Cervical cancer Maternal Aunt    Heart attack Brother        Died of MI in his mid 10s   Prostate cancer Brother        dx in his early 58s   Breast cancer Paternal Aunt        dx over 46   Bone cancer Cousin        maternal first cousin dx in her 48s-60s   Lung cancer Cousin        paternal first cousin   Colon cancer Neg Hx    Pancreatic cancer Neg Hx    Stomach cancer Neg Hx    Esophageal cancer Neg Hx    Rectal cancer Neg Hx     Social History   Tobacco Use   Smoking status: Never   Smokeless tobacco: Never  Vaping Use   Vaping Use: Never used  Substance Use Topics   Alcohol use: No   Drug use: No    Home Medications Prior to Admission medications   Medication Sig Start Date End Date Taking? Authorizing Provider  diphenhydrAMINE (BENADRYL) 25 MG tablet Take 25 mg by mouth at bedtime as needed.    [provider]  GENERLAC 10 GM/15ML SOLN TAKE 30 ML BY MOUTH EVERY 4 HOURS AS NEEDED FOR MILD CONSTIPATION UNTIL BOWEL MOVEMENT 09/13/17   [name], MD  hydrochlorothiazide (MICROZIDE) 12.5 MG capsule Take 12.5 mg by mouth daily. 02/28/16   [provider]  levothyroxine (SYNTHROID, LEVOTHROID) 75 MCG tablet Take 75 mcg by mouth daily. 07/19/13   [provider]  lidocaine-prilocaine (EMLA) cream Apply 1 application topically as needed. 01/09/20   [name], MD  Lysine 1000 MG TABS Take 1,000 mg by mouth as needed. Patient not  taking: Reported on 03/14/2021    [provider]  Multiple Vitamin (MULTIVITAMIN) tablet Take 1 tablet by mouth daily.    [provider]  Multiple Vitamins-Minerals (OCUVITE ADULT FORMULA PO) Take by mouth daily.    [provider]  omeprazole (PRILOSEC) 20 MG capsule 2 (two) times daily before a meal. 06/24/17   [provider]  OVER THE COUNTER MEDICATION Take 15 mLs by mouth 2 (two) times daily as needed (pain).  [provider]  OVER THE COUNTER MEDICATION daily. Memory Medication    [provider]  potassium chloride SA (KLOR-CON) 20 MEQ tablet Take 1 tablet (20 mEq total) by mouth daily. 03/14/21   [name], MD  venlafaxine XR (EFFEXOR-XR) 75 MG 24 hr capsule Take 225 mg by mouth daily.    [provider]    Allergies    Advil [ibuprofen], Codeine, Penicillins, Band-aid plus antibiotic [bacitracin-polymyxin b], Benazepril, and Other  Review of Systems   Review of Systems  Psychiatric/Behavioral:  Positive for confusion.   All other systems reviewed and are negative.  Physical Exam Updated Vital Signs BP (!) 155/107 (BP Location: Right Arm)   Pulse 82   Temp 98.3 F (36.8 C) (Oral)   Resp 16   Ht 5\' 3"  (1.6 m)   Wt 79.4 kg   SpO2 98%   BMI 31.00 kg/m   Physical Exam Vitals and nursing note reviewed.  Constitutional:      General: She is not in acute distress.    Appearance: She is well-developed. She is not diaphoretic.  HENT:     Head: Normocephalic and atraumatic.  Eyes:     Extraocular Movements: Extraocular movements intact.     Pupils: Pupils are equal, round, and reactive to light.  Cardiovascular:     Rate and Rhythm: Normal rate and regular rhythm.     Heart sounds: No murmur heard.   No friction rub. No gallop.  Pulmonary:     Effort: Pulmonary effort is normal. No respiratory distress.     Breath sounds: Normal breath sounds. No wheezing.  Abdominal:     General: Bowel sounds are  normal. There is no distension.     Palpations: Abdomen is soft.     Tenderness: There is no abdominal tenderness.  Musculoskeletal:        General: Normal range of motion.     Cervical back: Normal range of motion and neck supple.  Skin:    General: Skin is warm and dry.  Neurological:     General: No focal deficit present.     Mental Status: She is alert and oriented to person, place, and time. Mental status is at baseline.     Cranial Nerves: No cranial nerve deficit.     Sensory: No sensory deficit.     Motor: No weakness.     Coordination: Coordination normal.    ED Results / Procedures / Treatments   Labs (all labs ordered are listed, but only abnormal results are displayed) Labs Reviewed  COMPREHENSIVE METABOLIC PANEL - Abnormal; Notable for the following components:      Result Value   Potassium 3.3 (*)    Glucose, Bld 145 (*)    All other components within normal limits  CBC  URINALYSIS, ROUTINE W REFLEX MICROSCOPIC  CBG MONITORING, ED    EKG None  Radiology No results found.  Procedures Procedures   Medications Ordered in ED Medications - No data to display  ED Course  I have reviewed the triage vital signs and the nursing notes.  Pertinent labs & imaging results that were available during my care of the patient were reviewed by me and considered in my medical decision making (see chart for details).    MDM Rules/Calculators/A&P  Patient presenting with complaints of confusion and urinary symptoms.  Laboratory studies are unremarkable and neurologically patient is intact.  She is awake, alert, and oriented to person, place, time, and situation in the  ER.  Her urinalysis does show a UTI.  She was given IV Rocephin and will be discharged with Keflex.  Final Clinical Impression(s) / ED Diagnoses Final diagnoses:  None    Rx / DC Orders ED Discharge Orders     None        Veryl Speak, MD 05/20/21 [phone]

## 2021-05-19 NOTE — ED Triage Notes (Signed)
Patient from home to ED with daughter with c/o AMS. Daughter states that patient has been talking about taking care of deceased pets, being very confused. Patient is Aox4 in triage. Patient states she has had some pain the back/flank area, but is not experiencing pain right now.

## 2021-05-19 NOTE — ED Triage Notes (Signed)
Patient has also been sleeping a lot and daughter reports lethargy.

## 2021-05-20 ENCOUNTER — Inpatient Hospital Stay: Payer: Medicare Other

## 2021-05-20 VITALS — BP 146/76 | HR 75 | Temp 98.0°F | Resp 18

## 2021-05-20 DIAGNOSIS — Z95828 Presence of other vascular implants and grafts: Secondary | ICD-10-CM

## 2021-05-20 LAB — URINALYSIS, ROUTINE W REFLEX MICROSCOPIC
Bilirubin Urine: NEGATIVE
Glucose, UA: NEGATIVE mg/dL
Hgb urine dipstick: NEGATIVE
Ketones, ur: NEGATIVE mg/dL
Nitrite: NEGATIVE
Specific Gravity, Urine: 1.021 (ref 1.005–1.030)
WBC, UA: >50 WBC/hpf — ABNORMAL HIGH (ref 0–5)
pH: 6 (ref 5.0–8.0)

## 2021-05-20 MED ORDER — HEPARIN SOD (PORK) LOCK FLUSH 100 UNIT/ML IV SOLN
500.0000 [IU] | Freq: Once | INTRAVENOUS | Status: AC
  Administered 2021-05-20: 500 [IU] via INTRAVENOUS
  Filled 2021-05-20: qty 5

## 2021-05-20 MED ORDER — SODIUM CHLORIDE 0.9% FLUSH
10.0000 mL | INTRAVENOUS | Status: DC | PRN
  Administered 2021-05-20: 10 mL via INTRAVENOUS
  Filled 2021-05-20: qty 10

## 2021-05-20 MED ORDER — CEPHALEXIN 500 MG PO CAPS: 500.0000 mg | ORAL_CAPSULE | Freq: Three times a day (TID) | ORAL | 0 refills | Status: DC

## 2021-05-20 MED ORDER — SODIUM CHLORIDE 0.9 % IV SOLN
1.0000 g | Freq: Once | INTRAVENOUS | Status: AC
  Administered 2021-05-20: 1 g via INTRAVENOUS
  Filled 2021-05-20: qty 10

## 2021-05-20 NOTE — Discharge Instructions (Addendum)
Begin taking Keflex as prescribed.  Follow-up with primary doctor in 1 week, and return to the ER if you develop high fever, severe abdominal pain, worsening confusion, or other new and concerning symptoms.

## 2021-06-11 DIAGNOSIS — R5383 Other fatigue: Secondary | ICD-10-CM | POA: Diagnosis not present

## 2021-06-12 DIAGNOSIS — G4733 Obstructive sleep apnea (adult) (pediatric): Secondary | ICD-10-CM | POA: Diagnosis not present

## 2021-06-12 DIAGNOSIS — R5383 Other fatigue: Secondary | ICD-10-CM | POA: Diagnosis not present

## 2021-07-21 ENCOUNTER — Telehealth: Payer: Self-pay

## 2021-08-06 DIAGNOSIS — M2041 Other hammer toe(s) (acquired), right foot: Secondary | ICD-10-CM | POA: Diagnosis not present

## 2021-08-06 DIAGNOSIS — I10 Essential (primary) hypertension: Secondary | ICD-10-CM | POA: Diagnosis not present

## 2021-08-13 ENCOUNTER — Other Ambulatory Visit: Payer: Self-pay | Admitting: *Deleted

## 2021-08-13 DIAGNOSIS — C50212 Malignant neoplasm of upper-inner quadrant of left female breast: Secondary | ICD-10-CM

## 2021-08-13 MED ORDER — LIDOCAINE-PRILOCAINE 2.5-2.5 % EX CREA: 1.0000 "application " | TOPICAL_CREAM | CUTANEOUS | 6 refills | Status: DC | PRN

## 2021-08-14 ENCOUNTER — Inpatient Hospital Stay: Payer: Medicare Other | Attending: Hematology & Oncology

## 2021-08-14 ENCOUNTER — Other Ambulatory Visit: Payer: Self-pay

## 2021-08-14 VITALS — BP 130/70 | HR 75 | Temp 98.1°F | Resp 18

## 2021-08-14 DIAGNOSIS — Z853 Personal history of malignant neoplasm of breast: Secondary | ICD-10-CM | POA: Insufficient documentation

## 2021-08-14 DIAGNOSIS — Z452 Encounter for adjustment and management of vascular access device: Secondary | ICD-10-CM | POA: Insufficient documentation

## 2021-08-14 DIAGNOSIS — C50212 Malignant neoplasm of upper-inner quadrant of left female breast: Secondary | ICD-10-CM

## 2021-08-14 DIAGNOSIS — Z170 Estrogen receptor positive status [ER+]: Secondary | ICD-10-CM

## 2021-08-14 MED ORDER — HEPARIN SOD (PORK) LOCK FLUSH 100 UNIT/ML IV SOLN
500.0000 [IU] | Freq: Once | INTRAVENOUS | Status: AC | PRN
  Administered 2021-08-14: 500 [IU]

## 2021-08-14 MED ORDER — SODIUM CHLORIDE 0.9% FLUSH
10.0000 mL | INTRAVENOUS | Status: DC | PRN
  Administered 2021-08-14: 10 mL

## 2021-08-15 ENCOUNTER — Encounter: Payer: Self-pay | Admitting: Family

## 2021-08-19 ENCOUNTER — Ambulatory Visit (INDEPENDENT_AMBULATORY_CARE_PROVIDER_SITE_OTHER): Payer: Medicare Other

## 2021-08-19 ENCOUNTER — Ambulatory Visit: Payer: Medicare Other | Admitting: Podiatry

## 2021-08-19 ENCOUNTER — Other Ambulatory Visit: Payer: Self-pay

## 2021-08-19 ENCOUNTER — Encounter: Payer: Self-pay | Admitting: Podiatry

## 2021-08-19 DIAGNOSIS — T464X5A Adverse effect of angiotensin-converting-enzyme inhibitors, initial encounter: Secondary | ICD-10-CM | POA: Insufficient documentation

## 2021-08-19 DIAGNOSIS — Z9989 Dependence on other enabling machines and devices: Secondary | ICD-10-CM | POA: Insufficient documentation

## 2021-08-19 DIAGNOSIS — M2041 Other hammer toe(s) (acquired), right foot: Secondary | ICD-10-CM | POA: Diagnosis not present

## 2021-08-19 DIAGNOSIS — Z853 Personal history of malignant neoplasm of breast: Secondary | ICD-10-CM | POA: Insufficient documentation

## 2021-08-19 DIAGNOSIS — M21619 Bunion of unspecified foot: Secondary | ICD-10-CM

## 2021-08-19 DIAGNOSIS — I517 Cardiomegaly: Secondary | ICD-10-CM | POA: Insufficient documentation

## 2021-08-19 DIAGNOSIS — J45909 Unspecified asthma, uncomplicated: Secondary | ICD-10-CM | POA: Insufficient documentation

## 2021-08-19 DIAGNOSIS — B0230 Zoster ocular disease, unspecified: Secondary | ICD-10-CM | POA: Insufficient documentation

## 2021-08-19 DIAGNOSIS — E559 Vitamin D deficiency, unspecified: Secondary | ICD-10-CM | POA: Insufficient documentation

## 2021-08-19 DIAGNOSIS — F419 Anxiety disorder, unspecified: Secondary | ICD-10-CM | POA: Insufficient documentation

## 2021-08-19 DIAGNOSIS — Z8701 Personal history of pneumonia (recurrent): Secondary | ICD-10-CM | POA: Insufficient documentation

## 2021-08-19 DIAGNOSIS — Z86010 Personal history of colonic polyps: Secondary | ICD-10-CM | POA: Insufficient documentation

## 2021-08-19 DIAGNOSIS — K648 Other hemorrhoids: Secondary | ICD-10-CM | POA: Insufficient documentation

## 2021-08-19 DIAGNOSIS — J309 Allergic rhinitis, unspecified: Secondary | ICD-10-CM | POA: Insufficient documentation

## 2021-08-19 DIAGNOSIS — R400 Somnolence: Secondary | ICD-10-CM | POA: Insufficient documentation

## 2021-08-19 DIAGNOSIS — D508 Other iron deficiency anemias: Secondary | ICD-10-CM | POA: Insufficient documentation

## 2021-08-19 DIAGNOSIS — M8580 Other specified disorders of bone density and structure, unspecified site: Secondary | ICD-10-CM | POA: Insufficient documentation

## 2021-08-19 NOTE — Patient Instructions (Signed)
Bunion A bunion (hallux valgus) is a bump that forms slowly on the inner side of the big toe joint. It occurs when the big toe turns toward the second toe. Bunions may be small at first, but they often get larger over time. They can make walking painful. What are the causes? This condition may be caused by: Wearing narrow or pointed shoes that force the big toe to press against the other toes. Abnormal foot development that causes the foot to roll inward. Changes in the foot that are caused by certain diseases, such as rheumatoid arthritis or polio. A foot injury. What increases the risk? The following factors may make you more likely to develop this condition: Wearing shoes that squeeze the toes together. Having certain diseases, such as: Rheumatoid arthritis. Polio. Cerebral palsy. Having family members who have bunions. Being born with abnormally shaped feet (a foot deformity), such as flat feet or low arches. Doing activities that put a lot of pressure on the feet, such as ballet dancing. What are the signs or symptoms? The main symptom of this condition is a bump on your big toe that you can notice. Other symptoms may include: Pain. Redness and inflammation around your big toe. Thick or hardened skin on your big toe or between your toes. Stiffness or loss of motion in your big toe. Trouble with walking. How is this diagnosed? This condition may be diagnosed based on your symptoms, medical history, and activities. You may also have tests and imaging, such as: X-rays. These allow your health care provider to check the position of the bones in your foot and look for damage to your joint. They also help your health care provider determine the severity of your bunion and the best way to treat it. Joint aspiration. In this test, a sample of fluid is removed from the toe joint. This test may be done if you are in a lot of pain. It helps rule out diseases that cause painful swelling of the  joints, such as arthritis or gout. How is this treated? Treatment depends on the severity of your symptoms. The goal of treatment is to relieve symptoms and prevent your bunion from getting worse. Your health care provider may recommend: Wearing shoes that have a wide toe box, or using bunion pads to cushion the affected area. Taping your toes together to keep them in a normal position. Placing a device inside your shoe (orthotic device) to help reduce pressure on your toe joint. Taking medicine to ease pain and inflammation. Putting ice or heat on the affected area. Doing stretching exercises. Surgery, for severe cases. Follow these instructions at home: Managing pain, stiffness, and swelling   If directed, put ice on the painful area. To do this: Put ice in a plastic bag. Place a towel between your skin and the bag. Leave the ice on for 20 minutes, 2-3 times a day. Remove the ice if your skin turns bright red. This is very important. If you cannot feel pain, heat, or cold, you have a greater risk of damage to the area. If directed, apply heat to the affected area before you exercise. Use the heat source that your health care provider recommends, such as a moist heat pack or a heating pad. Place a towel between your skin and the heat source. Leave the heat on for 20-30 minutes. Remove the heat if your skin turns bright red. This is especially important if you are unable to feel pain, heat, or cold. You  have a greater risk of getting burned. General instructions Do exercises as told by your health care provider. Support your toe joint with proper footwear, shoe padding, or taping as told by your health care provider. Take over-the-counter and prescription medicines only as told by your health care provider. Do not use any products that contain nicotine or tobacco, such as cigarettes, e-cigarettes, and chewing tobacco. If you need help quitting, ask your health care provider. Keep all  follow-up visits. This is important. Contact a health care provider if: Your symptoms get worse. Your symptoms do not improve in 2 weeks. Get help right away if: You have severe pain and trouble with walking. Summary A bunion is a bump on the inner side of the big toe joint that forms when the big toe turns toward the second toe. Bunions can make walking painful. Treatment depends on the severity of your symptoms. Support your toe joint with proper footwear, shoe padding, or taping as told by your health care provider. This information is not intended to replace advice given to you by your health care provider. Make sure you discuss any questions you have with your health care provider. Document Revised: 02/23/2020 Document Reviewed: 02/23/2020 Elsevier Patient Education  2022 [employer] Toe Hammer toe is a change in the shape, or a deformity, of the toe. The deformity causes the middle joint of the toe to stay bent. Hammer toe starts gradually. At first, the toe can be straightened. Then over time, the toe deformity becomes stiff, inflexible, and permanently bent. Hammer toe usually affects the second, third, or fourth toe. A hammer toe causes pain, especially when wearing shoes. Corns and calluses can result from the toe rubbing against the inside of the shoe. Early treatments to keep the toe straight may relieve pain. As the deformity of the toe becomes stiff and permanent, surgery may be needed to straighten the toe. What are the causes? This condition is caused by abnormal bending of the toe joint that is closest to your foot. Over time, the toe bending downward pulls on the muscles and connections (tendons) of the toe joint, making them weak and stiff. Wearing shoes that are too narrow in the toe box and do not allow toes to fully straighten can cause this condition. What increases the risk? You are more likely to develop this condition if you: Are an older female. Wear shoes  that are too small, or wear high-heeled shoes that pinch your toes. Have a second toe that is longer than your big toe (first toe). Injure your foot or toe. Have arthritis, or have a nerve or muscle disorder. Have diabetes or a condition known as Charcot joint, which may cause you to walk abnormally. Have a family history of hammer toe. Are a Engineer, mining. What are the signs or symptoms? Pain and deformity of the toe are the main symptoms of this condition. The pain is worse when wearing shoes, walking, or running. Other symptoms may include: A thickened patch of skin, called a corn or callus, that forms over the top of the bent part of the toe or between the toes. Redness and a burning feeling on the bent toe. An open sore that forms on the top of the bent toe. Not being able to straighten the affected toe. How is this diagnosed? This condition is diagnosed based on your symptoms and a physical exam. During the exam, your health care provider will try to straighten your toe to see how stiff  the deformity is. You may also have tests, such as: A blood test to check for rheumatoid arthritis or diabetes. An X-ray to show how severe the toe deformity is. How is this treated? Treatment for this condition depends on whether the toe is flexible or deformed and no longer moveable. In less severe cases, a hammer toe can be straightened without surgery. These treatments include: Taping the toe into a straightened position. Using pads and cushions to protect the bent toe. Wearing shoes that provide enough room for the toes. Doing toe-stretching exercises at home. Taking an NSAID, such as ibuprofen, to reduce pain and swelling. Using special orthotics or insoles for pain relief and to improve walking. If these treatments do not help or the toe has a severe deformity and cannot be straightened, surgery is the next option. The most common surgeries used to straighten a hammer toe include: Arthroplasty  or osteotomy. Part of the toe joint is reconstructed or removed, which allows the toe to straighten. Fusion. Cartilage between the two bones of the joint is taken out, and the bones are fused together into one longer bone. Implantation. Part of the bone is removed and replaced with an implant to allow the toe to move again. Flexor tendon transfer. The tendons that curl the toes down (flexor tendons) are repositioned. Follow these instructions at home: Take over-the-counter and prescription medicines only as told by your health care provider. Do toe-straightening and stretching exercises as told by your health care provider. Keep all follow-up visits. This is important. How is this prevented? Wear shoes that fit properly and give your toes enough room. Shoes should not cause pain. Buy shoes at the end of the day to make sure they fit well, since your foot may swell during the day. Make sure they are comfortable before you buy them. As you age, your shoe size might change, including the width. Measure both feet and buy shoes for the larger foot. A shoe repair store might be able to stretch shoes that feel tight in spots. Do not wear high-heeled shoes or shoes with pointed toes. Contact a health care provider if: Your pain gets worse. Your toe becomes red or swollen. You develop an open sore on your toe. Summary Hammer toe is a condition that gradually causes your toe to become bent and stiff. Hammer toe can be treated by taping the toe into a straightened position and doing toe-stretching exercises. If these treatments do not help, surgery may be needed. To prevent this condition, wear shoes that fit properly, give your toes enough room, and do not cause pain. This information is not intended to replace advice given to you by your health care provider. Make sure you discuss any questions you have with your health care provider. Document Revised: 01/25/2020 Document Reviewed: 01/25/2020 Elsevier  Patient Education  2022 [employer].

## 2021-08-26 NOTE — Progress Notes (Signed)
Subjective:   Patient ID: Jessica [name], female   DOB: 80 y.o.   MRN: [id number]   HPI 80 year old female presents the office today for concerns of a hammertoe of the right second toe.  The pain started in July 18, 2021.  She recently went on a trip which make the pain worse.  She presents with burning sensation and hurts with shoes.  No swelling.  No recent injury or trauma.  No recent treatment.  No other concerns.   Review of Systems  All other systems reviewed and are negative.  Past Medical History:  Diagnosis Date   Allergy    Anemia    Anxiety    Asthma    in past, no inhalers now   Blood transfusion without reported diagnosis    Breast cancer ([place]) 01/2017   left breast    Breast cancer of upper-inner quadrant of left female breast ([place]) 03/11/2017   Cancer ([place]) 01/2017   left breast   Cataract    bilateral removed   Chronic kidney disease    kidney stones   Depression 08/31/2013   Dyslipidemia, goal LDL below 160 08/31/2013   Essential hypertension - well controlled 08/31/2013   Family history of breast cancer    Family history of melanoma    Family history of prostate cancer    GERD (gastroesophageal reflux disease)    Goals of care, counseling/discussion 03/11/2017   History of radiation therapy 10/13/17-11/11/17   left breast 40.05 Gy in 15 fractions, left breast boost 10 Gy in 5 fractions   Hypothyroidism    Obesity (BMI 30-39.9) 08/31/2013   Personal history of chemotherapy 2018   Personal history of radiation therapy 2019   Thyroid disease    Vasovagal near-syncope -rare 08/31/2013    Past Surgical History:  Procedure Laterality Date   ABDOMINAL HYSTERECTOMY     total   BREAST BIOPSY Left 02/24/2017    malignant   BREAST LUMPECTOMY Left 03/23/2017   broken fingers     CATARACT EXTRACTION Bilateral    COLONOSCOPY     EXCISIONAL HEMORRHOIDECTOMY     IR FLUORO GUIDE PORT INSERTION RIGHT  08/04/2017   IR REMOVAL TUN ACCESS W/ PORT W/O FL MOD  SED  08/04/2017   IR US GUIDE VASC ACCESS RIGHT  08/04/2017   IRRIGATION AND DEBRIDEMENT ABSCESS Left 05/18/2017   Procedure: IRRIGATION AND DEBRIDEMENT LEFT AXILLARY ABSCESS;  Surgeon: Michael Boston, MD;  Location: [facility];  Service: General;  Laterality: Left;   kidney stones lithotripsy     PORTACATH PLACEMENT Right 03/23/2017   Procedure: INSERTION PORT-A-CATH;  Surgeon: Erroll Luna, MD;  Location: [facility];  Service: General;  Laterality: Right;   RADIOACTIVE SEED GUIDED PARTIAL MASTECTOMY WITH AXILLARY SENTINEL LYMPH NODE BIOPSY Left 03/23/2017   Procedure: LEFT BREAST RADIOACTIVE SEED GUIDED PARTIAL MASTECTOMY AND  SENTINEL LYMPH NODE MAPPING;  Surgeon: Erroll Luna, MD;  Location: [facility];  Service: General;  Laterality: Left;   ROTATOR CUFF REPAIR     left    TONSILLECTOMY     WISDOM TOOTH EXTRACTION       Current Outpatient Medications:    lactulose (CHRONULAC) 10 GM/15ML solution, TAKE 30 ML BY MOUTH EVERY 4 HOURS AS NEEDED FOR MILD CONSTIPATION UNTIL BOWEL MOVEMENT, Disp: , Rfl:    azithromycin (ZITHROMAX) 250 MG tablet, TK 2 TS PO ON DAY 1, THEN TK 1 T PO D FOR 4 DAYS, Disp: , Rfl:    cephALEXin (KEFLEX)  500 MG capsule, Take 1 capsule (500 mg total) by mouth 3 (three) times daily., Disp: 21 capsule, Rfl: 0   diphenhydrAMINE (BENADRYL) 25 MG tablet, Take 25 mg by mouth at bedtime as needed., Disp: , Rfl:    doxycycline (VIBRAMYCIN) 100 MG capsule, Take 100 mg by mouth 2 (two) times daily., Disp: , Rfl:    GENERLAC 10 GM/15ML SOLN, TAKE 30 ML BY MOUTH EVERY 4 HOURS AS NEEDED FOR MILD CONSTIPATION UNTIL BOWEL MOVEMENT, Disp: 21600 mL, Rfl: 3   hydrochlorothiazide (MICROZIDE) 12.5 MG capsule, Take 12.5 mg by mouth daily., Disp: , Rfl: 1   levothyroxine (SYNTHROID, LEVOTHROID) 75 MCG tablet, Take 75 mcg by mouth daily., Disp: , Rfl:    lidocaine-prilocaine (EMLA) cream, Apply 1 application topically as needed., Disp: 30 g, Rfl: 6   Lysine 1000 MG  TABS, Take 1,000 mg by mouth as needed. (Patient not taking: Reported on 03/14/2021), Disp: , Rfl:    mirtazapine (REMERON) 7.5 MG tablet, Take 7.5 mg by mouth at bedtime., Disp: , Rfl:    Multiple Vitamin (MULTIVITAMIN) tablet, Take 1 tablet by mouth daily., Disp: , Rfl:    Multiple Vitamins-Minerals (OCUVITE ADULT FORMULA PO), Take by mouth daily., Disp: , Rfl:    nitrofurantoin, macrocrystal-monohydrate, (MACROBID) 100 MG capsule, Take 100 mg by mouth 2 (two) times daily., Disp: , Rfl:    omeprazole (PRILOSEC) 20 MG capsule, 2 (two) times daily before a meal., Disp: , Rfl:    omeprazole (PRILOSEC) 20 MG capsule, Take 1 capsule by mouth 2 (two) times daily., Disp: , Rfl:    OVER THE COUNTER MEDICATION, Take 15 mLs by mouth 2 (two) times daily as needed (pain)., Disp: , Rfl:    OVER THE COUNTER MEDICATION, daily. Memory Medication, Disp: , Rfl:    potassium chloride SA (KLOR-CON) 20 MEQ tablet, Take 1 tablet (20 mEq total) by mouth daily., Disp: 14 tablet, Rfl: 0   venlafaxine XR (EFFEXOR-XR) 75 MG 24 hr capsule, Take 225 mg by mouth daily., Disp: , Rfl:   Allergies  Allergen Reactions   Advil [Ibuprofen] Hives and Itching   Codeine Hives   Penicillins Hives    Has patient had a PCN reaction causing immediate rash, facial/tongue/throat swelling, SOB or lightheadedness with hypotension: yes Has patient had a PCN reaction causing severe rash involving mucus membranes or skin necrosis:  no Has patient had a PCN reaction that required hospitalization:  no Has patient had a PCN reaction occurring within the last 10 years: no If all of the above answers are "NO", then may proceed with Cephalosporin use. Has patient had a PCN reaction causing immediate rash, facial/tongue/throat swelling, SOB or lightheadedness with hypotension: yes Has patient had a PCN reaction causing severe rash involving mucus membranes or skin necrosis:  no Has patient had a PCN reaction that required hospitalization:  no Has  patient had a PCN reaction occurring within the last 10 years: no If all of the above answers are "NO", then may proceed with Cephalosporin use.    Sulfamethoxazole-Trimethoprim Hives   Band-Aid Plus Antibiotic [Bacitracin-Polymyxin B] Dermatitis   Benazepril Swelling    Possible cause of tongue swelling   Other Rash    Band-aid          Objective:  Physical Exam  General: AAO x3, NAD  Dermatological: Skin is warm, dry and supple bilateral.  Mild erythema dorsal second PIPJ from irritation but there is no skin breakdown or warmth.  There are no open sores, no  preulcerative lesions, no rash or signs of infection present.  Vascular: Dorsalis Pedis artery and Posterior Tibial artery pedal pulses are palpable bilateral with immedate capillary fill time. There is no pain with calf compression, swelling, warmth, erythema.   Neruologic: Grossly intact via light touch bilateral.   Musculoskeletal: Significant bunions present with hammertoe contracture present.  Tenderness on the dorsal second PIPJ.  Muscular strength 5/5 in all groups tested bilateral.  Gait: Unassisted, Nonantalgic.       Assessment:   Hammertoe contracture present right second toe     Plan:  -Treatment options discussed including all alternatives, risks, and complications -Etiology of symptoms were discussed -X-rays were obtained and reviewed with the patient.  Bunion, hammertoes present.  No evidence of acute fracture. -Discussed both conservative as well as surgical options.  Unfortunately not able to get the toe to sit straight with the surgery.  For now continue with conservative care.  Dispensed offloading pads, discussed shoe modifications to avoid any pressure.  Monitor for any skin breakdown or any ulcerations or signs of infection.    Trula Slade DPM

## 2021-09-12 ENCOUNTER — Ambulatory Visit: Payer: Medicare Other | Admitting: Hematology & Oncology

## 2021-09-12 ENCOUNTER — Other Ambulatory Visit: Payer: Medicare Other

## 2021-09-15 ENCOUNTER — Other Ambulatory Visit: Payer: Medicare Other

## 2021-09-15 ENCOUNTER — Ambulatory Visit: Payer: Medicare Other | Admitting: Hematology & Oncology

## 2021-09-23 ENCOUNTER — Ambulatory Visit: Payer: Medicare Other | Admitting: Hematology & Oncology

## 2021-09-23 ENCOUNTER — Other Ambulatory Visit: Payer: Self-pay

## 2021-09-23 ENCOUNTER — Encounter: Payer: Self-pay | Admitting: Hematology & Oncology

## 2021-09-23 ENCOUNTER — Other Ambulatory Visit: Payer: Medicare Other

## 2021-09-23 ENCOUNTER — Inpatient Hospital Stay: Payer: Medicare Other

## 2021-09-23 ENCOUNTER — Inpatient Hospital Stay: Payer: Medicare Other | Admitting: Hematology & Oncology

## 2021-09-23 ENCOUNTER — Inpatient Hospital Stay: Payer: Medicare Other | Attending: Hematology & Oncology

## 2021-09-23 VITALS — BP 160/74 | HR 81 | Temp 98.3°F | Resp 16 | Wt 182.0 lb

## 2021-09-23 DIAGNOSIS — C50912 Malignant neoplasm of unspecified site of left female breast: Secondary | ICD-10-CM | POA: Diagnosis not present

## 2021-09-23 DIAGNOSIS — E038 Other specified hypothyroidism: Secondary | ICD-10-CM

## 2021-09-23 DIAGNOSIS — Z171 Estrogen receptor negative status [ER-]: Secondary | ICD-10-CM | POA: Insufficient documentation

## 2021-09-23 DIAGNOSIS — L03112 Cellulitis of left axilla: Secondary | ICD-10-CM | POA: Diagnosis not present

## 2021-09-23 DIAGNOSIS — Z79899 Other long term (current) drug therapy: Secondary | ICD-10-CM | POA: Diagnosis not present

## 2021-09-23 DIAGNOSIS — C50212 Malignant neoplasm of upper-inner quadrant of left female breast: Secondary | ICD-10-CM

## 2021-09-23 LAB — CBC WITH DIFFERENTIAL (CANCER CENTER ONLY)
Abs Immature Granulocytes: 0.01 10*3/uL (ref 0.00–0.07)
Basophils Absolute: 0 10*3/uL (ref 0.0–0.1)
Basophils Relative: 0 %
Eosinophils Absolute: 0.1 10*3/uL (ref 0.0–0.5)
Eosinophils Relative: 2 %
HCT: 35.9 % — ABNORMAL LOW (ref 36.0–46.0)
Hemoglobin: 11.6 g/dL — ABNORMAL LOW (ref 12.0–15.0)
Immature Granulocytes: 0 %
Lymphocytes Relative: 24 %
Lymphs Abs: 1.2 10*3/uL (ref 0.7–4.0)
MCH: 30.6 pg (ref 26.0–34.0)
MCHC: 32.3 g/dL (ref 30.0–36.0)
MCV: 94.7 fL (ref 80.0–100.0)
Monocytes Absolute: 0.4 10*3/uL (ref 0.1–1.0)
Monocytes Relative: 7 %
Neutro Abs: 3.4 10*3/uL (ref 1.7–7.7)
Neutrophils Relative %: 67 %
Platelet Count: 257 10*3/uL (ref 150–400)
RBC: 3.79 MIL/uL — ABNORMAL LOW (ref 3.87–5.11)
RDW: 14.8 % (ref 11.5–15.5)
WBC Count: 5.1 10*3/uL (ref 4.0–10.5)
nRBC: 0 % (ref 0.0–0.2)

## 2021-09-23 LAB — CMP (CANCER CENTER ONLY)
ALT: 15 U/L (ref 0–44)
AST: 20 U/L (ref 15–41)
Albumin: 4.3 g/dL (ref 3.5–5.0)
Alkaline Phosphatase: 63 U/L (ref 38–126)
Anion gap: 12 (ref 5–15)
BUN: 15 mg/dL (ref 8–23)
CO2: 28 mmol/L (ref 22–32)
Calcium: 10.1 mg/dL (ref 8.9–10.3)
Chloride: 100 mmol/L (ref 98–111)
Creatinine: 0.82 mg/dL (ref 0.44–1.00)
GFR, Estimated: >60 mL/min (ref >60)
Glucose, Bld: 141 mg/dL — ABNORMAL HIGH (ref 70–99)
Potassium: 3.2 mmol/L — ABNORMAL LOW (ref 3.5–5.1)
Sodium: 140 mmol/L (ref 135–145)
Total Bilirubin: 0.3 mg/dL (ref 0.3–1.2)
Total Protein: 7.1 g/dL (ref 6.5–8.1)

## 2021-09-23 LAB — IRON AND TIBC
Iron: 56 ug/dL (ref 41–142)
Saturation Ratios: 13 % — ABNORMAL LOW (ref 21–57)
TIBC: 413 ug/dL (ref 236–444)
UIBC: 357 ug/dL (ref 120–384)

## 2021-09-23 LAB — FERRITIN: Ferritin: 11 ng/mL (ref 11–307)

## 2021-09-23 LAB — TSH: TSH: 3.66 u[IU]/mL (ref 0.308–3.960)

## 2021-09-23 NOTE — Addendum Note (Signed)
Addended by: Melton Krebs on: 09/23/2021 11:02 AM   Modules accepted: Orders

## 2021-09-23 NOTE — Patient Instructions (Signed)

## 2021-09-23 NOTE — Progress Notes (Signed)
Hematology and Oncology Follow Up Visit  Jessica [name] [id number] 21-Dec-1940 80 y.o. 09/23/2021   Principle Diagnosis:  Stage IIA (T2bN0M0) invasive ductal ca of the LEFT breast - TRIPLE NEGATIVE Staphylococcus aureus wound infection of the LEFT axilla Cellulitis of the left breast  Past Therapy:   Taxotere/Carboplatin - s/p cycle #4 -completed adjuvant therapy on 09/09/2017 Radiation therapy to the right breast.-Completed 4005 rad with an additional 1000 rad boost on 11/11/2017  Current Therapy: Observation   Interim History:  Jessica [name] is here today for follow-up.  We saw her 6 months ago.  She had a wonderful summer.  She and her family went up to [place].  They are on a cruise.  They really had a wonderful time.  She is little bit cranky this morning.  She spilled some coffee on herself.  I was sorry that this happened.  Otherwise, I think she is doing fairly well.  She is going have a nice Thanksgiving at her daughter's house.  She has had no change in bowel or bladder habits.  She has had no problems with COVID.  She has had no cough.  There is been no nausea or vomiting.  She has had no fever.  She has had no bleeding.  She has had no leg swelling.  There is been no change in medications.  Overall, I would say her performance status is probably ECOG 1.    Medications:  Allergies as of 09/23/2021       Reactions   Advil [ibuprofen] Hives, Itching   Codeine Hives   Penicillins Hives   Has patient had a PCN reaction causing immediate rash, facial/tongue/throat swelling, SOB or lightheadedness with hypotension: yes Has patient had a PCN reaction causing severe rash involving mucus membranes or skin necrosis:  no Has patient had a PCN reaction that required hospitalization:  no Has patient had a PCN reaction occurring within the last 10 years: no If all of the above answers are "NO", then may proceed with Cephalosporin use. Has patient had a PCN reaction causing  immediate rash, facial/tongue/throat swelling, SOB or lightheadedness with hypotension: yes Has patient had a PCN reaction causing severe rash involving mucus membranes or skin necrosis:  no Has patient had a PCN reaction that required hospitalization:  no Has patient had a PCN reaction occurring within the last 10 years: no If all of the above answers are "NO", then may proceed with Cephalosporin use.   Sulfamethoxazole-trimethoprim Hives   Band-aid Plus Antibiotic [bacitracin-polymyxin B] Dermatitis   Benazepril Swelling   Possible cause of tongue swelling   Other Rash   Band-aid        Medication List        Accurate as of September 23, 2021 10:32 AM. If you have any questions, ask your nurse or doctor.          azithromycin 250 MG tablet Commonly known as: ZITHROMAX TK 2 TS PO ON DAY 1, THEN TK 1 T PO D FOR 4 DAYS   cephALEXin 500 MG capsule Commonly known as: KEFLEX Take 1 capsule (500 mg total) by mouth 3 (three) times daily.   diphenhydrAMINE 25 MG tablet Commonly known as: BENADRYL Take 25 mg by mouth at bedtime as needed.   doxycycline 100 MG capsule Commonly known as: VIBRAMYCIN Take 100 mg by mouth 2 (two) times daily.   Generlac 10 GM/15ML Soln Generic drug: lactulose (encephalopathy) TAKE 30 ML BY MOUTH EVERY 4 HOURS AS NEEDED FOR MILD CONSTIPATION  UNTIL BOWEL MOVEMENT   hydrochlorothiazide 12.5 MG capsule Commonly known as: MICROZIDE Take 12.5 mg by mouth daily.   lactulose 10 GM/15ML solution Commonly known as: CHRONULAC TAKE 30 ML BY MOUTH EVERY 4 HOURS AS NEEDED FOR MILD CONSTIPATION UNTIL BOWEL MOVEMENT   levothyroxine 75 MCG tablet Commonly known as: SYNTHROID Take 75 mcg by mouth daily.   lidocaine-prilocaine cream Commonly known as: EMLA Apply 1 application topically as needed.   Lysine 1000 MG Tabs Take 1,000 mg by mouth as needed.   mirtazapine 7.5 MG tablet Commonly known as: REMERON Take 7.5 mg by mouth at bedtime.    multivitamin tablet Take 1 tablet by mouth daily.   nitrofurantoin (macrocrystal-monohydrate) 100 MG capsule Commonly known as: MACROBID Take 100 mg by mouth 2 (two) times daily.   OCUVITE ADULT FORMULA PO Take by mouth daily.   omeprazole 20 MG capsule Commonly known as: PRILOSEC Take 1 capsule by mouth 2 (two) times daily.   omeprazole 20 MG capsule Commonly known as: PRILOSEC 2 (two) times daily before a meal.   OVER THE COUNTER MEDICATION Take 15 mLs by mouth 2 (two) times daily as needed (pain).   OVER THE COUNTER MEDICATION daily. Memory Medication   potassium chloride SA 20 MEQ tablet Commonly known as: KLOR-CON Take 1 tablet (20 mEq total) by mouth daily.   venlafaxine XR 75 MG 24 hr capsule Commonly known as: EFFEXOR-XR Take 225 mg by mouth daily.        Allergies:  Allergies  Allergen Reactions   Advil [Ibuprofen] Hives and Itching   Codeine Hives   Penicillins Hives    Has patient had a PCN reaction causing immediate rash, facial/tongue/throat swelling, SOB or lightheadedness with hypotension: yes Has patient had a PCN reaction causing severe rash involving mucus membranes or skin necrosis:  no Has patient had a PCN reaction that required hospitalization:  no Has patient had a PCN reaction occurring within the last 10 years: no If all of the above answers are "NO", then may proceed with Cephalosporin use. Has patient had a PCN reaction causing immediate rash, facial/tongue/throat swelling, SOB or lightheadedness with hypotension: yes Has patient had a PCN reaction causing severe rash involving mucus membranes or skin necrosis:  no Has patient had a PCN reaction that required hospitalization:  no Has patient had a PCN reaction occurring within the last 10 years: no If all of the above answers are "NO", then may proceed with Cephalosporin use.    Sulfamethoxazole-Trimethoprim Hives   Band-Aid Plus Antibiotic [Bacitracin-Polymyxin B] Dermatitis    Benazepril Swelling    Possible cause of tongue swelling   Other Rash    Band-aid    Past Medical History, Surgical history, Social history, and Family History were reviewed and updated.  Review of Systems: Review of Systems  Constitutional: Negative.   HENT: Negative.    Eyes: Negative.   Respiratory: Negative.    Cardiovascular: Negative.   Gastrointestinal: Negative.   Genitourinary: Negative.   Musculoskeletal: Negative.   Skin: Negative.   Neurological: Negative.   Endo/Heme/Allergies: Negative.   Psychiatric/Behavioral: Negative.      Physical Exam:  vitals were not taken for this visit.   Wt Readings from Last 3 Encounters:  05/19/21 175 lb (79.4 kg)  03/14/21 168 lb (76.2 kg)  11/28/20 180 lb (81.6 kg)  Vital signs were taken.  Temperature 98.4.  Pulse 96.  Blood pressure 125/70.  Weight is 175 pounds.  Physical Exam Vitals reviewed.  Constitutional:  Comments: Her breast exam shows right breast with no masses, edema or erythema.  There is no right axillary adenopathy.  Left breast shows the lumpectomy that is well-healed.  She has some contraction of the left breast.  There are some firmness at the lumpectomy site secondary to prior infection that required secondary intention.  There is no distinct mass in the left breast.  There is no left axillary adenopathy.  There is some telangiectasias on the left breast from radiation.  HENT:     Head: Normocephalic and atraumatic.  Eyes:     Pupils: Pupils are equal, round, and reactive to light.  Cardiovascular:     Rate and Rhythm: Normal rate and regular rhythm.     Heart sounds: Normal heart sounds.  Pulmonary:     Effort: Pulmonary effort is normal.     Breath sounds: Normal breath sounds.  Abdominal:     General: Bowel sounds are normal.     Palpations: Abdomen is soft.  Musculoskeletal:        General: No tenderness or deformity. Normal range of motion.     Cervical back: Normal range of motion.   Lymphadenopathy:     Cervical: No cervical adenopathy.  Skin:    General: Skin is warm and dry.     Findings: No erythema or rash.  Neurological:     Mental Status: She is alert and oriented to person, place, and time.  Psychiatric:        Behavior: Behavior normal.        Thought Content: Thought content normal.        Judgment: Judgment normal.     Lab Results  Component Value Date   WBC 5.1 09/23/2021   HGB 11.6 (L) 09/23/2021   HCT 35.9 (L) 09/23/2021   MCV 94.7 09/23/2021   PLT 257 09/23/2021   Lab Results  Component Value Date   FERRITIN 24 03/14/2020   IRON 108 03/14/2020   TIBC 363 03/14/2020   UIBC 254 03/14/2020   IRONPCTSAT 30 03/14/2020   Lab Results  Component Value Date   RBC 3.79 (L) 09/23/2021   No results found for: KPAFRELGTCHN, LAMBDASER, KAPLAMBRATIO No results found for: IGGSERUM, IGA, IGMSERUM No results found for: Odetta Pink, SPEI   Chemistry      Component Value Date/Time   NA 139 05/19/2021 2000   NA 143 10/08/2017 1042   NA 141 06/16/2017 1014   K 3.3 (L) 05/19/2021 2000   K 3.4 10/08/2017 1042   K 3.8 06/16/2017 1014   CL 100 05/19/2021 2000   CL 100 10/08/2017 1042   CO2 30 05/19/2021 2000   CO2 30 10/08/2017 1042   CO2 31 (H) 06/16/2017 1014   BUN 11 05/19/2021 2000   BUN 10 10/08/2017 1042   BUN 8.9 06/16/2017 1014   CREATININE 0.72 05/19/2021 2000   CREATININE 0.75 03/14/2021 1149   CREATININE 0.9 10/08/2017 1042   CREATININE 0.8 06/16/2017 1014      Component Value Date/Time   CALCIUM 9.4 05/19/2021 2000   CALCIUM 9.5 10/08/2017 1042   CALCIUM 10.1 06/16/2017 1014   ALKPHOS 60 05/19/2021 2000   ALKPHOS 68 10/08/2017 1042   ALKPHOS 64 06/16/2017 1014   AST 18 05/19/2021 2000   AST 15 03/14/2021 1149   AST 21 06/16/2017 1014   ALT 13 05/19/2021 2000   ALT 12 03/14/2021 1149   ALT 20 10/08/2017 1042   ALT 20 06/16/2017  1014   BILITOT 0.3 05/19/2021 2000    BILITOT 0.3 03/14/2021 1149   BILITOT 0.37 06/16/2017 1014      Impression and Plan: Ms. Mehlman is a very pleasant 80 yo caucasian female with stage IIA ductal carcinoma of the left breast.  Her breast cancer is TRIPLE NEGATIVE.    She had her partial mastectomy in May 2018 followed by chemotherapy and radiation.  She completed chemotherapy in November 2018.  She then had radiation and completed radiation in January 2019.  She had a big 80th birthday last month.  Know that she had a wonderful time.  I am sure this [place] cruise a part of her 80th birthday gift.  Right now, I do not see any evidence of recurrent disease.   We will plan to see her back in another 6 months.  [name], MD 11/22/202210:32 AM

## 2021-10-03 ENCOUNTER — Inpatient Hospital Stay: Payer: Medicare Other | Attending: Hematology & Oncology

## 2021-10-03 ENCOUNTER — Other Ambulatory Visit: Payer: Self-pay

## 2021-10-03 VITALS — BP 176/73 | HR 68 | Temp 98.1°F | Resp 16

## 2021-10-03 DIAGNOSIS — Z170 Estrogen receptor positive status [ER+]: Secondary | ICD-10-CM | POA: Insufficient documentation

## 2021-10-03 DIAGNOSIS — C50212 Malignant neoplasm of upper-inner quadrant of left female breast: Secondary | ICD-10-CM | POA: Insufficient documentation

## 2021-10-03 MED ORDER — HEPARIN SOD (PORK) LOCK FLUSH 100 UNIT/ML IV SOLN
500.0000 [IU] | Freq: Once | INTRAVENOUS | Status: AC | PRN
  Administered 2021-10-03: 500 [IU]

## 2021-10-03 MED ORDER — SODIUM CHLORIDE 0.9 % IV SOLN
200.0000 mg | Freq: Once | INTRAVENOUS | Status: AC
  Administered 2021-10-03: 200 mg via INTRAVENOUS
  Filled 2021-10-03: qty 200

## 2021-10-03 MED ORDER — SODIUM CHLORIDE 0.9 % IV SOLN: Freq: Once | INTRAVENOUS | Status: AC

## 2021-10-03 MED ORDER — HEPARIN SOD (PORK) LOCK FLUSH 100 UNIT/ML IV SOLN: 500.0000 [IU] | Freq: Once | INTRAVENOUS | Status: DC | PRN

## 2021-10-03 MED ORDER — SODIUM CHLORIDE 0.9% FLUSH
10.0000 mL | Freq: Once | INTRAVENOUS | Status: AC | PRN
  Administered 2021-10-03: 10 mL

## 2021-10-03 MED ORDER — SODIUM CHLORIDE 0.9% FLUSH: 10.0000 mL | INTRAVENOUS | Status: DC | PRN

## 2021-10-03 NOTE — Patient Instructions (Signed)

## 2021-10-29 DIAGNOSIS — R8290 Unspecified abnormal findings in urine: Secondary | ICD-10-CM | POA: Diagnosis not present

## 2021-10-29 DIAGNOSIS — R3915 Urgency of urination: Secondary | ICD-10-CM | POA: Diagnosis not present

## 2021-10-31 DIAGNOSIS — K219 Gastro-esophageal reflux disease without esophagitis: Secondary | ICD-10-CM | POA: Diagnosis not present

## 2021-10-31 DIAGNOSIS — E039 Hypothyroidism, unspecified: Secondary | ICD-10-CM | POA: Diagnosis not present

## 2021-10-31 DIAGNOSIS — I10 Essential (primary) hypertension: Secondary | ICD-10-CM | POA: Diagnosis not present

## 2021-10-31 DIAGNOSIS — E7800 Pure hypercholesterolemia, unspecified: Secondary | ICD-10-CM | POA: Diagnosis not present

## 2021-11-06 DIAGNOSIS — R3915 Urgency of urination: Secondary | ICD-10-CM | POA: Diagnosis not present

## 2021-11-06 DIAGNOSIS — R7301 Impaired fasting glucose: Secondary | ICD-10-CM | POA: Diagnosis not present

## 2021-11-06 DIAGNOSIS — E039 Hypothyroidism, unspecified: Secondary | ICD-10-CM | POA: Diagnosis not present

## 2021-11-06 DIAGNOSIS — R5383 Other fatigue: Secondary | ICD-10-CM | POA: Diagnosis not present

## 2021-11-06 DIAGNOSIS — E559 Vitamin D deficiency, unspecified: Secondary | ICD-10-CM | POA: Diagnosis not present

## 2021-11-06 DIAGNOSIS — R400 Somnolence: Secondary | ICD-10-CM | POA: Diagnosis not present

## 2021-11-06 DIAGNOSIS — R8290 Unspecified abnormal findings in urine: Secondary | ICD-10-CM | POA: Diagnosis not present

## 2021-11-19 ENCOUNTER — Ambulatory Visit: Payer: Medicare Other | Admitting: Podiatry

## 2021-11-21 ENCOUNTER — Ambulatory Visit: Payer: Medicare Other | Admitting: Podiatry

## 2021-11-24 ENCOUNTER — Other Ambulatory Visit: Payer: Self-pay

## 2021-11-24 ENCOUNTER — Inpatient Hospital Stay: Payer: Medicare Other | Attending: Hematology & Oncology

## 2021-11-24 DIAGNOSIS — Z853 Personal history of malignant neoplasm of breast: Secondary | ICD-10-CM | POA: Insufficient documentation

## 2021-11-24 NOTE — Patient Instructions (Signed)

## 2021-12-02 DIAGNOSIS — I10 Essential (primary) hypertension: Secondary | ICD-10-CM | POA: Diagnosis not present

## 2021-12-02 DIAGNOSIS — E7800 Pure hypercholesterolemia, unspecified: Secondary | ICD-10-CM | POA: Diagnosis not present

## 2021-12-02 DIAGNOSIS — E039 Hypothyroidism, unspecified: Secondary | ICD-10-CM | POA: Diagnosis not present

## 2021-12-02 DIAGNOSIS — K219 Gastro-esophageal reflux disease without esophagitis: Secondary | ICD-10-CM | POA: Diagnosis not present

## 2021-12-08 DIAGNOSIS — S61459A Open bite of unspecified hand, initial encounter: Secondary | ICD-10-CM | POA: Diagnosis not present

## 2021-12-09 ENCOUNTER — Ambulatory Visit: Payer: Medicare Other | Admitting: Podiatry

## 2021-12-31 ENCOUNTER — Emergency Department (HOSPITAL_BASED_OUTPATIENT_CLINIC_OR_DEPARTMENT_OTHER)
Admission: EM | Admit: 2021-12-31 | Discharge: 2021-12-31 | Disposition: A | Discharge status: Home / Self Care | Payer: Medicare Other | Attending: Emergency Medicine | Admitting: Emergency Medicine

## 2021-12-31 ENCOUNTER — Other Ambulatory Visit: Payer: Self-pay

## 2021-12-31 ENCOUNTER — Emergency Department (HOSPITAL_BASED_OUTPATIENT_CLINIC_OR_DEPARTMENT_OTHER): Payer: Medicare Other

## 2021-12-31 ENCOUNTER — Encounter (HOSPITAL_BASED_OUTPATIENT_CLINIC_OR_DEPARTMENT_OTHER): Payer: Self-pay | Admitting: Emergency Medicine

## 2021-12-31 DIAGNOSIS — N189 Chronic kidney disease, unspecified: Secondary | ICD-10-CM | POA: Diagnosis not present

## 2021-12-31 DIAGNOSIS — D649 Anemia, unspecified: Secondary | ICD-10-CM | POA: Insufficient documentation

## 2021-12-31 DIAGNOSIS — R404 Transient alteration of awareness: Secondary | ICD-10-CM | POA: Diagnosis not present

## 2021-12-31 DIAGNOSIS — J45909 Unspecified asthma, uncomplicated: Secondary | ICD-10-CM | POA: Insufficient documentation

## 2021-12-31 DIAGNOSIS — I129 Hypertensive chronic kidney disease with stage 1 through stage 4 chronic kidney disease, or unspecified chronic kidney disease: Secondary | ICD-10-CM | POA: Insufficient documentation

## 2021-12-31 DIAGNOSIS — Z20822 Contact with and (suspected) exposure to covid-19: Secondary | ICD-10-CM | POA: Insufficient documentation

## 2021-12-31 DIAGNOSIS — E039 Hypothyroidism, unspecified: Secondary | ICD-10-CM | POA: Diagnosis not present

## 2021-12-31 DIAGNOSIS — R4182 Altered mental status, unspecified: Secondary | ICD-10-CM | POA: Diagnosis present

## 2021-12-31 DIAGNOSIS — Z853 Personal history of malignant neoplasm of breast: Secondary | ICD-10-CM | POA: Insufficient documentation

## 2021-12-31 LAB — URINALYSIS, ROUTINE W REFLEX MICROSCOPIC
Bilirubin Urine: NEGATIVE
Glucose, UA: NEGATIVE mg/dL
Hgb urine dipstick: NEGATIVE
Ketones, ur: NEGATIVE mg/dL
Nitrite: NEGATIVE
Protein, ur: NEGATIVE mg/dL
Specific Gravity, Urine: 1.01 (ref 1.005–1.030)
pH: 6 (ref 5.0–8.0)

## 2021-12-31 LAB — CBC WITH DIFFERENTIAL/PLATELET
Abs Immature Granulocytes: 0.02 10*3/uL (ref 0.00–0.07)
Basophils Absolute: 0 10*3/uL (ref 0.0–0.1)
Basophils Relative: 0 %
Eosinophils Absolute: 0.3 10*3/uL (ref 0.0–0.5)
Eosinophils Relative: 4 %
HCT: 35.8 % — ABNORMAL LOW (ref 36.0–46.0)
Hemoglobin: 11.6 g/dL — ABNORMAL LOW (ref 12.0–15.0)
Immature Granulocytes: 0 %
Lymphocytes Relative: 23 %
Lymphs Abs: 1.4 10*3/uL (ref 0.7–4.0)
MCH: 31.3 pg (ref 26.0–34.0)
MCHC: 32.4 g/dL (ref 30.0–36.0)
MCV: 96.5 fL (ref 80.0–100.0)
Monocytes Absolute: 0.5 10*3/uL (ref 0.1–1.0)
Monocytes Relative: 9 %
Neutro Abs: 3.9 10*3/uL (ref 1.7–7.7)
Neutrophils Relative %: 64 %
Platelets: 241 10*3/uL (ref 150–400)
RBC: 3.71 MIL/uL — ABNORMAL LOW (ref 3.87–5.11)
RDW: 13.4 % (ref 11.5–15.5)
WBC: 6.1 10*3/uL (ref 4.0–10.5)
nRBC: 0 % (ref 0.0–0.2)

## 2021-12-31 LAB — RAPID URINE DRUG SCREEN, HOSP PERFORMED
Amphetamines: NOT DETECTED
Barbiturates: NOT DETECTED
Benzodiazepines: NOT DETECTED
Cocaine: NOT DETECTED
Opiates: NOT DETECTED
Tetrahydrocannabinol: NOT DETECTED

## 2021-12-31 LAB — RESP PANEL BY RT-PCR (FLU A&B, COVID) ARPGX2
Influenza A by PCR: NEGATIVE
Influenza B by PCR: NEGATIVE
SARS Coronavirus 2 by RT PCR: NEGATIVE

## 2021-12-31 LAB — COMPREHENSIVE METABOLIC PANEL
ALT: 12 U/L (ref 0–44)
AST: 16 U/L (ref 15–41)
Albumin: 3.8 g/dL (ref 3.5–5.0)
Alkaline Phosphatase: 58 U/L (ref 38–126)
Anion gap: 8 (ref 5–15)
BUN: 15 mg/dL (ref 8–23)
CO2: 29 mmol/L (ref 22–32)
Calcium: 9.3 mg/dL (ref 8.9–10.3)
Chloride: 101 mmol/L (ref 98–111)
Creatinine, Ser: 0.71 mg/dL (ref 0.44–1.00)
GFR, Estimated: >60 mL/min (ref >60)
Glucose, Bld: 119 mg/dL — ABNORMAL HIGH (ref 70–99)
Potassium: 3.4 mmol/L — ABNORMAL LOW (ref 3.5–5.1)
Sodium: 138 mmol/L (ref 135–145)
Total Bilirubin: 0.3 mg/dL (ref 0.3–1.2)
Total Protein: 6.5 g/dL (ref 6.5–8.1)

## 2021-12-31 LAB — LIPASE, BLOOD: Lipase: 71 U/L — ABNORMAL HIGH (ref 11–51)

## 2021-12-31 LAB — ETHANOL: Alcohol, Ethyl (B): <10 mg/dL (ref <10)

## 2021-12-31 MED ORDER — HEPARIN SOD (PORK) LOCK FLUSH 100 UNIT/ML IV SOLN
INTRAVENOUS | Status: AC
Start: 2021-12-31 — End: 2021-12-31
  Administered 2021-12-31: 5 [IU]
  Filled 2021-12-31: qty 5

## 2021-12-31 MED ORDER — LIDOCAINE-EPINEPHRINE-TETRACAINE (LET) TOPICAL GEL
3.0000 mL | Freq: Once | TOPICAL | Status: AC
Start: 2021-12-31 — End: 2021-12-31
  Administered 2021-12-31: 3 mL via TOPICAL
  Filled 2021-12-31: qty 3

## 2021-12-31 MED ORDER — LIDOCAINE-PRILOCAINE 2.5-2.5 % EX CREA: TOPICAL_CREAM | Freq: Once | CUTANEOUS | Status: DC

## 2021-12-31 NOTE — ED Provider Notes (Signed)
Emergency Department Provider Note   I have reviewed the triage vital signs and the nursing notes.   HISTORY  Chief Complaint Altered Mental Status   HPI Jessica [name] is a 81 y.o. female with PMH reviewed below presents to the emergency department for evaluation of somewhat bizarre behavior over the past 2 days.  Patient is here with her daughter.  The daughter tells me that currently she is acting like her normal self but on 2 occasions she has had some somewhat bizarre and atypical behavior.  Yesterday, she was at her house and saw a red blinking white up toward their ceiling.  The patient apparently has a toy locomotive in that area and the patient noticed a blinking red light.  The daughter went to check in the light was there but the patient stated that she felt like people were listening to her and that a family member was working for the [employer].  The patient tells me that this was a joke but the daughter states that it seemed fairly serious at the time.  She states that her mother does like to joke around.  She had a second episode today when meeting with their tax and financial advisor.  The patient states that the meeting was going on very Arliss Frisina when she was getting frustrated so she took off her shoes and put her feet up on the desk.  The patient remembers doing this and states that she was frustrated because of the incessant talking but after this the daughter contacted the PCP and was referred to the emergency department for evaluation.  She did start a new medication in January when she saw her PCP but cannot recall the name.  She does take thyroid medication and is compliant with this. No EtOH or drugs.    Past Medical History:  Diagnosis Date   Allergy    Anemia    Anxiety    Asthma    in past, no inhalers now   Blood transfusion without reported diagnosis    Breast cancer ([place]) 01/2017   left breast    Breast cancer of upper-inner quadrant of left female breast ([place])  03/11/2017   Cancer ([place]) 01/2017   left breast   Cataract    bilateral removed   Chronic kidney disease    kidney stones   Depression 08/31/2013   Dyslipidemia, goal LDL below 160 08/31/2013   Essential hypertension - well controlled 08/31/2013   Family history of breast cancer    Family history of melanoma    Family history of prostate cancer    GERD (gastroesophageal reflux disease)    Goals of care, counseling/discussion 03/11/2017   History of radiation therapy 10/13/17-11/11/17   left breast 40.05 Gy in 15 fractions, left breast boost 10 Gy in 5 fractions   Hypothyroidism    Obesity (BMI 30-39.9) 08/31/2013   Personal history of chemotherapy 2018   Personal history of radiation therapy 2019   Thyroid disease    Vasovagal near-syncope -rare 08/31/2013    Review of Systems  Constitutional: No fever/chills Eyes: No visual changes. ENT: No sore throat. Cardiovascular: Denies chest pain. Respiratory: Denies shortness of breath. Gastrointestinal: No abdominal pain.  No nausea, no vomiting.  No diarrhea.  No constipation. Genitourinary: Negative for dysuria. Musculoskeletal: Negative for back pain. Skin: Negative for rash. Neurological: Negative for headaches, focal weakness or numbness.   ____________________________________________   PHYSICAL EXAM:  VITAL SIGNS: ED Triage Vitals  Enc Vitals Group  BP 12/31/21 1805 (!) 161/73     Pulse Rate 12/31/21 1805 70     Resp 12/31/21 1805 18     Temp 12/31/21 1805 98.7 F (37.1 C)     Temp Source 12/31/21 1805 Oral     SpO2 12/31/21 1805 99 %     Weight 12/31/21 1808 182 lb 1.6 oz (82.6 kg)     Height 12/31/21 1808 5\' 3"  (1.6 m)   Constitutional: Alert and oriented. Well appearing and in no acute distress. Eyes: Conjunctivae are normal. PERRL. EOMI. Head: Atraumatic. Nose: No congestion/rhinnorhea. Mouth/Throat: Mucous membranes are moist.  Neck: No stridor.   Cardiovascular: Normal rate, regular rhythm. Good  peripheral circulation. Grossly normal heart sounds.   Respiratory: Normal respiratory effort.  No retractions. Lungs CTAB. Gastrointestinal: Soft and nontender. No distention.  Musculoskeletal: No lower extremity tenderness nor edema. No gross deformities of extremities. Neurologic:  Normal speech and language. No gross focal neurologic deficits are appreciated.  Skin:  Skin is warm, dry and intact. No rash noted. Psychiatric: Mood and affect are normal. Speech and behavior are normal.  ____________________________________________   LABS (all labs ordered are listed, but only abnormal results are displayed)  Labs Reviewed  COMPREHENSIVE METABOLIC PANEL - Abnormal; Notable for the following components:      Result Value   Potassium 3.4 (*)    Glucose, Bld 119 (*)    All other components within normal limits  LIPASE, BLOOD - Abnormal; Notable for the following components:   Lipase 71 (*)    All other components within normal limits  CBC WITH DIFFERENTIAL/PLATELET - Abnormal; Notable for the following components:   RBC 3.71 (*)    Hemoglobin 11.6 (*)    HCT 35.8 (*)    All other components within normal limits  URINALYSIS, ROUTINE W REFLEX MICROSCOPIC - Abnormal; Notable for the following components:   Leukocytes,Ua SMALL (*)    All other components within normal limits  RESP PANEL BY RT-PCR (FLU A&B, COVID) [id number]  URINE CULTURE  ETHANOL  RAPID URINE DRUG SCREEN, HOSP PERFORMED    ____________________________________________   PROCEDURES  Procedure(s) performed:   Procedures  None  ____________________________________________   INITIAL IMPRESSION / ASSESSMENT AND PLAN / ED COURSE  Pertinent labs & imaging results that were available during my care of the patient were reviewed by me and considered in my medical decision making (see chart for details).   This patient is Presenting for Evaluation of AMS, which does require a range of treatment options, and is a  complaint that involves a high risk of morbidity and mortality.  The Differential Diagnoses includes but is not exclusive to alcohol, illicit or prescription medications, intracranial pathology such as stroke, intracerebral hemorrhage, fever or infectious causes including sepsis, hypoxemia, uremia, trauma, endocrine related disorders such as diabetes, hypoglycemia, thyroid-related diseases, etc.    I did obtain Additional Historical Information from daughter at bedside who has witnessed this behavior.  I decided to review pertinent External Data, and in summary unable to see PCP notes or identify the name of the new medication.   Clinical Laboratory Tests Ordered, included normal CBC with mild anemia. No leukocytosis. No AKI or uremia. COVID and Flu are negative. UA and UDS are unremarkable. EtOH negative.   Radiologic Tests Ordered, included CT head. I independently interpreted the images and agree with radiology interpretation.   Cardiac Monitor Tracing which shows NSR   Social Determinants of Health Risk patient denies any EtOH or drugs.  Medical Decision Making: Summary:  Patient presents emergency department with somewhat bizarre behavior in the past 2 days.  She does have some new medication which she began taking in January but no problems until yesterday.  She has no focal findings on exam to strongly suspect stroke or encephalitis.  She is afebrile here.  She has a playful and joking personality and appears to be at her baseline.  Her daughter, who agrees that this is her baseline, does note that the behavior described above is very different for her.  Will obtain CT imaging along with UA, labs, viral PCR panel.  No focal deficits to suspect stroke or prompt transfer for emergent MRI.   Reevaluation with update and discussion with patient and daughter at bedside. Workup here is largely reassuring. Plan for close PCP follow up to review home medications and make adjustments PRN. No  clear AMS here. No TIA symptoms or findings on exam to strongly suspect CVA.   Disposition: discharge  ____________________________________________  FINAL CLINICAL IMPRESSION(S) / ED DIAGNOSES  Final diagnoses:  Transient alteration of awareness    Note:  This document was prepared using Dragon voice recognition software and may include unintentional dictation errors.  Nanda Quinton, MD, [facility]    Rhylie Stehr, [facility], MD 01/09/22 [phone]

## 2021-12-31 NOTE — Discharge Instructions (Signed)
You were seen in the emergency room today with some bizarre behavior.  Your lab work, CT scan of the head, urine test all look normal.  I would like for you to coordinate closely with your primary care doctor to review your home medications and decide if additional testing is warranted.  If your symptoms return or suddenly worsen you should return to the emergency department for reevaluation. ?

## 2021-12-31 NOTE — ED Triage Notes (Signed)
Pt BIB by daughter. Per daughter the patient was at a financial appt today and kicked her shoes off and put her feet on the conference table. Daughter also stated pt said yesterday the pt looked at a light and thought someone was watching her. Pt has no complaints an states she doesn't need to be here. Daughter said these behaviors are out of the norm for her mother. She called PCP who advised she come to ER to be evaluated. Pt recently started a medication a couple weeks ago to help her stay awake more neither patient nor daughter recall what the medication is. Pt is A&O x 4 and ambulated to room without difficulty.  ?

## 2022-01-01 DIAGNOSIS — E876 Hypokalemia: Secondary | ICD-10-CM | POA: Diagnosis not present

## 2022-01-01 DIAGNOSIS — D72810 Lymphocytopenia: Secondary | ICD-10-CM | POA: Diagnosis not present

## 2022-01-01 DIAGNOSIS — R404 Transient alteration of awareness: Secondary | ICD-10-CM | POA: Diagnosis not present

## 2022-01-01 DIAGNOSIS — Z9114 Patient's other noncompliance with medication regimen: Secondary | ICD-10-CM | POA: Diagnosis not present

## 2022-01-02 LAB — URINE CULTURE: Culture: NO GROWTH

## 2022-01-22 ENCOUNTER — Inpatient Hospital Stay: Payer: Medicare Other | Attending: Hematology & Oncology

## 2022-01-22 ENCOUNTER — Other Ambulatory Visit: Payer: Self-pay

## 2022-01-22 VITALS — BP 169/82 | HR 71 | Temp 98.3°F | Resp 17

## 2022-01-22 DIAGNOSIS — Z853 Personal history of malignant neoplasm of breast: Secondary | ICD-10-CM | POA: Diagnosis not present

## 2022-01-22 DIAGNOSIS — Z170 Estrogen receptor positive status [ER+]: Secondary | ICD-10-CM

## 2022-01-22 MED ORDER — ALTEPLASE 2 MG IJ SOLR: 2.0000 mg | Freq: Once | INTRAMUSCULAR | Status: DC | PRN

## 2022-01-22 MED ORDER — HEPARIN SOD (PORK) LOCK FLUSH 100 UNIT/ML IV SOLN
500.0000 [IU] | Freq: Once | INTRAVENOUS | Status: AC | PRN
  Administered 2022-01-22: 500 [IU]

## 2022-01-22 MED ORDER — SODIUM CHLORIDE 0.9% FLUSH
10.0000 mL | INTRAVENOUS | Status: DC | PRN
  Administered 2022-01-22: 10 mL

## 2022-01-22 NOTE — Patient Instructions (Signed)

## 2022-01-29 DIAGNOSIS — R3915 Urgency of urination: Secondary | ICD-10-CM | POA: Diagnosis not present

## 2022-01-29 DIAGNOSIS — R5383 Other fatigue: Secondary | ICD-10-CM | POA: Diagnosis not present

## 2022-01-29 DIAGNOSIS — R7301 Impaired fasting glucose: Secondary | ICD-10-CM | POA: Diagnosis not present

## 2022-01-29 DIAGNOSIS — E039 Hypothyroidism, unspecified: Secondary | ICD-10-CM | POA: Diagnosis not present

## 2022-02-05 DIAGNOSIS — M8589 Other specified disorders of bone density and structure, multiple sites: Secondary | ICD-10-CM | POA: Diagnosis not present

## 2022-02-05 DIAGNOSIS — E039 Hypothyroidism, unspecified: Secondary | ICD-10-CM | POA: Diagnosis not present

## 2022-02-05 DIAGNOSIS — Z23 Encounter for immunization: Secondary | ICD-10-CM | POA: Diagnosis not present

## 2022-02-05 DIAGNOSIS — E7800 Pure hypercholesterolemia, unspecified: Secondary | ICD-10-CM | POA: Diagnosis not present

## 2022-02-05 DIAGNOSIS — Z0000 Encounter for general adult medical examination without abnormal findings: Secondary | ICD-10-CM | POA: Diagnosis not present

## 2022-02-05 DIAGNOSIS — K219 Gastro-esophageal reflux disease without esophagitis: Secondary | ICD-10-CM | POA: Diagnosis not present

## 2022-02-25 ENCOUNTER — Telehealth: Payer: Self-pay | Admitting: *Deleted

## 2022-02-25 ENCOUNTER — Other Ambulatory Visit: Payer: Self-pay | Admitting: *Deleted

## 2022-02-25 MED ORDER — LACTULOSE ENCEPHALOPATHY 10 GM/15ML PO SOLN: ORAL | 3 refills | Status: DC

## 2022-02-25 NOTE — Telephone Encounter (Signed)
Received a call from patients daughter stating that patient has been suffering from constipation.  Whatever dr Marin Olp prescribed last time worked well.  RX for Generlac sent to patients pharmacy  ?

## 2022-03-19 ENCOUNTER — Inpatient Hospital Stay: Payer: Medicare Other

## 2022-03-19 ENCOUNTER — Other Ambulatory Visit: Payer: Self-pay

## 2022-03-19 ENCOUNTER — Inpatient Hospital Stay (HOSPITAL_BASED_OUTPATIENT_CLINIC_OR_DEPARTMENT_OTHER): Payer: Medicare Other | Admitting: Hematology & Oncology

## 2022-03-19 ENCOUNTER — Encounter: Payer: Self-pay | Admitting: Hematology & Oncology

## 2022-03-19 ENCOUNTER — Inpatient Hospital Stay: Payer: Medicare Other | Attending: Hematology & Oncology

## 2022-03-19 VITALS — BP 142/68 | HR 72 | Temp 98.7°F | Resp 18 | Wt 179.0 lb

## 2022-03-19 DIAGNOSIS — G9340 Encephalopathy, unspecified: Secondary | ICD-10-CM | POA: Diagnosis not present

## 2022-03-19 DIAGNOSIS — C50212 Malignant neoplasm of upper-inner quadrant of left female breast: Secondary | ICD-10-CM

## 2022-03-19 DIAGNOSIS — E876 Hypokalemia: Secondary | ICD-10-CM | POA: Diagnosis not present

## 2022-03-19 DIAGNOSIS — K5901 Slow transit constipation: Secondary | ICD-10-CM | POA: Diagnosis not present

## 2022-03-19 DIAGNOSIS — I10 Essential (primary) hypertension: Secondary | ICD-10-CM | POA: Diagnosis not present

## 2022-03-19 DIAGNOSIS — K219 Gastro-esophageal reflux disease without esophagitis: Secondary | ICD-10-CM | POA: Diagnosis not present

## 2022-03-19 DIAGNOSIS — G8321 Monoplegia of upper limb affecting right dominant side: Secondary | ICD-10-CM | POA: Diagnosis not present

## 2022-03-19 DIAGNOSIS — I672 Cerebral atherosclerosis: Secondary | ICD-10-CM | POA: Diagnosis not present

## 2022-03-19 DIAGNOSIS — Z6832 Body mass index (BMI) 32.0-32.9, adult: Secondary | ICD-10-CM | POA: Diagnosis not present

## 2022-03-19 DIAGNOSIS — Z9221 Personal history of antineoplastic chemotherapy: Secondary | ICD-10-CM | POA: Diagnosis not present

## 2022-03-19 DIAGNOSIS — R4701 Aphasia: Secondary | ICD-10-CM | POA: Diagnosis not present

## 2022-03-19 DIAGNOSIS — R0902 Hypoxemia: Secondary | ICD-10-CM | POA: Diagnosis not present

## 2022-03-19 DIAGNOSIS — Z91199 Patient's noncompliance with other medical treatment and regimen due to unspecified reason: Secondary | ICD-10-CM | POA: Diagnosis not present

## 2022-03-19 DIAGNOSIS — Z95828 Presence of other vascular implants and grafts: Secondary | ICD-10-CM

## 2022-03-19 DIAGNOSIS — F32A Depression, unspecified: Secondary | ICD-10-CM | POA: Diagnosis not present

## 2022-03-19 DIAGNOSIS — E785 Hyperlipidemia, unspecified: Secondary | ICD-10-CM | POA: Diagnosis not present

## 2022-03-19 DIAGNOSIS — F418 Other specified anxiety disorders: Secondary | ICD-10-CM | POA: Diagnosis not present

## 2022-03-19 DIAGNOSIS — K5900 Constipation, unspecified: Secondary | ICD-10-CM | POA: Diagnosis not present

## 2022-03-19 DIAGNOSIS — F419 Anxiety disorder, unspecified: Secondary | ICD-10-CM | POA: Diagnosis present

## 2022-03-19 DIAGNOSIS — I700 Atherosclerosis of aorta: Secondary | ICD-10-CM | POA: Diagnosis not present

## 2022-03-19 DIAGNOSIS — G4733 Obstructive sleep apnea (adult) (pediatric): Secondary | ICD-10-CM | POA: Diagnosis not present

## 2022-03-19 DIAGNOSIS — R410 Disorientation, unspecified: Secondary | ICD-10-CM | POA: Diagnosis not present

## 2022-03-19 DIAGNOSIS — Z8673 Personal history of transient ischemic attack (TIA), and cerebral infarction without residual deficits: Secondary | ICD-10-CM | POA: Diagnosis not present

## 2022-03-19 DIAGNOSIS — Z923 Personal history of irradiation: Secondary | ICD-10-CM | POA: Diagnosis not present

## 2022-03-19 DIAGNOSIS — I6381 Other cerebral infarction due to occlusion or stenosis of small artery: Secondary | ICD-10-CM | POA: Diagnosis not present

## 2022-03-19 DIAGNOSIS — R29810 Facial weakness: Secondary | ICD-10-CM | POA: Diagnosis not present

## 2022-03-19 DIAGNOSIS — R531 Weakness: Secondary | ICD-10-CM | POA: Diagnosis not present

## 2022-03-19 DIAGNOSIS — I639 Cerebral infarction, unspecified: Secondary | ICD-10-CM | POA: Diagnosis not present

## 2022-03-19 DIAGNOSIS — R4781 Slurred speech: Secondary | ICD-10-CM | POA: Diagnosis not present

## 2022-03-19 DIAGNOSIS — E669 Obesity, unspecified: Secondary | ICD-10-CM | POA: Diagnosis present

## 2022-03-19 DIAGNOSIS — K2100 Gastro-esophageal reflux disease with esophagitis, without bleeding: Secondary | ICD-10-CM | POA: Diagnosis not present

## 2022-03-19 DIAGNOSIS — Z9012 Acquired absence of left breast and nipple: Secondary | ICD-10-CM | POA: Diagnosis not present

## 2022-03-19 DIAGNOSIS — Z90710 Acquired absence of both cervix and uterus: Secondary | ICD-10-CM | POA: Diagnosis not present

## 2022-03-19 DIAGNOSIS — I6389 Other cerebral infarction: Secondary | ICD-10-CM | POA: Diagnosis not present

## 2022-03-19 DIAGNOSIS — Z853 Personal history of malignant neoplasm of breast: Secondary | ICD-10-CM | POA: Diagnosis not present

## 2022-03-19 DIAGNOSIS — E039 Hypothyroidism, unspecified: Secondary | ICD-10-CM | POA: Diagnosis not present

## 2022-03-19 DIAGNOSIS — Z87440 Personal history of urinary (tract) infections: Secondary | ICD-10-CM | POA: Diagnosis not present

## 2022-03-19 LAB — CMP (CANCER CENTER ONLY)
ALT: 15 U/L (ref 0–44)
AST: 17 U/L (ref 15–41)
Albumin: 4.4 g/dL (ref 3.5–5.0)
Alkaline Phosphatase: 66 U/L (ref 38–126)
Anion gap: 8 (ref 5–15)
BUN: 15 mg/dL (ref 8–23)
CO2: 31 mmol/L (ref 22–32)
Calcium: 9.8 mg/dL (ref 8.9–10.3)
Chloride: 98 mmol/L (ref 98–111)
Creatinine: 0.88 mg/dL (ref 0.44–1.00)
GFR, Estimated: >60 mL/min (ref >60)
Glucose, Bld: 102 mg/dL — ABNORMAL HIGH (ref 70–99)
Potassium: 3.8 mmol/L (ref 3.5–5.1)
Sodium: 137 mmol/L (ref 135–145)
Total Bilirubin: 0.5 mg/dL (ref 0.3–1.2)
Total Protein: 7.5 g/dL (ref 6.5–8.1)

## 2022-03-19 LAB — LACTATE DEHYDROGENASE: LDH: 154 U/L (ref 98–192)

## 2022-03-19 LAB — CBC WITH DIFFERENTIAL (CANCER CENTER ONLY)
Abs Immature Granulocytes: 0.02 10*3/uL (ref 0.00–0.07)
Basophils Absolute: 0 10*3/uL (ref 0.0–0.1)
Basophils Relative: 0 %
Eosinophils Absolute: 0.2 10*3/uL (ref 0.0–0.5)
Eosinophils Relative: 3 %
HCT: 39.7 % (ref 36.0–46.0)
Hemoglobin: 13.3 g/dL (ref 12.0–15.0)
Immature Granulocytes: 0 %
Lymphocytes Relative: 19 %
Lymphs Abs: 1.2 10*3/uL (ref 0.7–4.0)
MCH: 33.5 pg (ref 26.0–34.0)
MCHC: 33.5 g/dL (ref 30.0–36.0)
MCV: 100 fL (ref 80.0–100.0)
Monocytes Absolute: 0.5 10*3/uL (ref 0.1–1.0)
Monocytes Relative: 8 %
Neutro Abs: 4.3 10*3/uL (ref 1.7–7.7)
Neutrophils Relative %: 70 %
Platelet Count: 272 10*3/uL (ref 150–400)
RBC: 3.97 MIL/uL (ref 3.87–5.11)
RDW: 13.8 % (ref 11.5–15.5)
WBC Count: 6.3 10*3/uL (ref 4.0–10.5)
nRBC: 0 % (ref 0.0–0.2)

## 2022-03-19 MED ORDER — SODIUM CHLORIDE 0.9% FLUSH
10.0000 mL | Freq: Once | INTRAVENOUS | Status: AC
  Administered 2022-03-19: 10 mL via INTRAVENOUS

## 2022-03-19 MED ORDER — HEPARIN SOD (PORK) LOCK FLUSH 100 UNIT/ML IV SOLN
500.0000 [IU] | Freq: Once | INTRAVENOUS | Status: AC
  Administered 2022-03-19: 500 [IU] via INTRAVENOUS

## 2022-03-19 NOTE — Patient Instructions (Signed)

## 2022-03-19 NOTE — Progress Notes (Signed)
Hematology and Oncology Follow Up Visit  Jessica [name] [id number] 16-May-1941 81 y.o. 03/19/2022   Principle Diagnosis:  Stage IIA ([id number]) invasive ductal ca of the LEFT breast - TRIPLE NEGATIVE Staphylococcus aureus wound infection of the LEFT axilla Cellulitis of the left breast  Past Therapy:   Taxotere/Carboplatin - s/p cycle #4 -completed adjuvant therapy on 09/09/2017 Radiation therapy to the right breast.-Completed 4005 rad with an additional 1000 rad boost on 11/11/2017  Current Therapy: Observation   Interim History:  Jessica [name] is here today for follow-up.  We saw her 6 months ago.  She is doing pretty well.  She is certainly in a good mood today.  She had a very nice Mother's Day weekend.  She has had no problems with nausea or vomiting.  There is no cough or shortness of breath.  She is having problems with her left shoulder.  She has seen Orthopedic Surgery in the past.  I suspect she probably need to see them again.  She has had no fever.  She has had no change in bowel or bladder habits.  She has had no rashes.  There is been no leg swelling.  Overall, I would have to say that her performance status is probably ECOG 1.    Medications:  Allergies as of 03/19/2022       Reactions   Advil [ibuprofen] Hives, Itching   Codeine Hives   Penicillins Hives   Has patient had a PCN reaction causing immediate rash, facial/tongue/throat swelling, SOB or lightheadedness with hypotension: yes Has patient had a PCN reaction causing severe rash involving mucus membranes or skin necrosis:  no Has patient had a PCN reaction that required hospitalization:  no Has patient had a PCN reaction occurring within the last 10 years: no If all of the above answers are "NO", then may proceed with Cephalosporin use. Has patient had a PCN reaction causing immediate rash, facial/tongue/throat swelling, SOB or lightheadedness with hypotension: yes Has patient had a PCN reaction causing  severe rash involving mucus membranes or skin necrosis:  no Has patient had a PCN reaction that required hospitalization:  no Has patient had a PCN reaction occurring within the last 10 years: no If all of the above answers are "NO", then may proceed with Cephalosporin use.   Sulfamethoxazole-trimethoprim Hives   Band-aid Plus Antibiotic [bacitracin-polymyxin B] Dermatitis   Benazepril Swelling   Possible cause of tongue swelling   Other Rash   Band-aid        Medication List        Accurate as of Mar 19, 2022 12:45 PM. If you have any questions, ask your nurse or doctor.          hydrochlorothiazide 12.5 MG capsule Commonly known as: MICROZIDE Take 12.5 mg by mouth daily.   lactulose (encephalopathy) 10 GM/15ML Soln Commonly known as: Generlac TAKE 30 ML BY MOUTH EVERY 4 HOURS AS NEEDED FOR MILD CONSTIPATION UNTIL BOWEL MOVEMENT   lactulose 10 GM/15ML solution Commonly known as: CHRONULAC TAKE 30 ML BY MOUTH EVERY 4 HOURS AS NEEDED FOR MILD CONSTIPATION UNTIL BOWEL MOVEMENT   levothyroxine 75 MCG tablet Commonly known as: SYNTHROID Take 75 mcg by mouth daily.   lidocaine-prilocaine cream Commonly known as: EMLA Apply 1 application topically as needed.   Lysine 1000 MG Tabs Take 1,000 mg by mouth as needed.   mirtazapine 7.5 MG tablet Commonly known as: REMERON Take 7.5 mg by mouth at bedtime.   multivitamin tablet Take 1 tablet  by mouth daily.   OCUVITE ADULT FORMULA PO Take by mouth daily.   omeprazole 20 MG capsule Commonly known as: PRILOSEC Take 1 capsule by mouth 2 (two) times daily.   omeprazole 20 MG capsule Commonly known as: PRILOSEC 2 (two) times daily before a meal.   OVER THE COUNTER MEDICATION Take 15 mLs by mouth 2 (two) times daily as needed (pain).   OVER THE COUNTER MEDICATION daily. Memory Medication   potassium chloride SA 20 MEQ tablet Commonly known as: KLOR-CON M Take 1 tablet (20 mEq total) by mouth daily.    venlafaxine XR 75 MG 24 hr capsule Commonly known as: EFFEXOR-XR Take 225 mg by mouth daily.        Allergies:  Allergies  Allergen Reactions   Advil [Ibuprofen] Hives and Itching   Codeine Hives   Penicillins Hives    Has patient had a PCN reaction causing immediate rash, facial/tongue/throat swelling, SOB or lightheadedness with hypotension: yes Has patient had a PCN reaction causing severe rash involving mucus membranes or skin necrosis:  no Has patient had a PCN reaction that required hospitalization:  no Has patient had a PCN reaction occurring within the last 10 years: no If all of the above answers are "NO", then may proceed with Cephalosporin use. Has patient had a PCN reaction causing immediate rash, facial/tongue/throat swelling, SOB or lightheadedness with hypotension: yes Has patient had a PCN reaction causing severe rash involving mucus membranes or skin necrosis:  no Has patient had a PCN reaction that required hospitalization:  no Has patient had a PCN reaction occurring within the last 10 years: no If all of the above answers are "NO", then may proceed with Cephalosporin use.    Sulfamethoxazole-Trimethoprim Hives   Band-Aid Plus Antibiotic [Bacitracin-Polymyxin B] Dermatitis   Benazepril Swelling    Possible cause of tongue swelling   Other Rash    Band-aid    Past Medical History, Surgical history, Social history, and Family History were reviewed and updated.  Review of Systems: Review of Systems  Constitutional: Negative.   HENT: Negative.    Eyes: Negative.   Respiratory: Negative.    Cardiovascular: Negative.   Gastrointestinal: Negative.   Genitourinary: Negative.   Musculoskeletal: Negative.   Skin: Negative.   Neurological: Negative.   Endo/Heme/Allergies: Negative.   Psychiatric/Behavioral: Negative.      Physical Exam:  weight is 179 lb (81.2 kg). Her oral temperature is 98.7 F (37.1 C). Her blood pressure is 142/68 (abnormal) and her  pulse is 72. Her respiration is 18 and oxygen saturation is 99%.   Wt Readings from Last 3 Encounters:  03/19/22 179 lb (81.2 kg)  12/31/21 182 lb 1.6 oz (82.6 kg)  09/23/21 182 lb (82.6 kg)  Vital signs were taken.  Temperature 98.4.  Pulse 96.  Blood pressure 125/70.  Weight is 175 pounds.  Physical Exam Vitals reviewed.  Constitutional:      Comments: Her breast exam shows right breast with no masses, edema or erythema.  There is no right axillary adenopathy.  Left breast shows the lumpectomy that is well-healed.  She has some contraction of the left breast.  There are some firmness at the lumpectomy site secondary to prior infection that required secondary intention.  There is no distinct mass in the left breast.  There is no left axillary adenopathy.  There is some telangiectasias on the left breast from radiation.  HENT:     Head: Normocephalic and atraumatic.  Eyes:  Pupils: Pupils are equal, round, and reactive to light.  Cardiovascular:     Rate and Rhythm: Normal rate and regular rhythm.     Heart sounds: Normal heart sounds.  Pulmonary:     Effort: Pulmonary effort is normal.     Breath sounds: Normal breath sounds.  Abdominal:     General: Bowel sounds are normal.     Palpations: Abdomen is soft.  Musculoskeletal:        General: No tenderness or deformity. Normal range of motion.     Cervical back: Normal range of motion.  Lymphadenopathy:     Cervical: No cervical adenopathy.  Skin:    General: Skin is warm and dry.     Findings: No erythema or rash.  Neurological:     Mental Status: She is alert and oriented to person, place, and time.  Psychiatric:        Behavior: Behavior normal.        Thought Content: Thought content normal.        Judgment: Judgment normal.     Lab Results  Component Value Date   WBC 6.3 03/19/2022   HGB 13.3 03/19/2022   HCT 39.7 03/19/2022   MCV 100.0 03/19/2022   PLT 272 03/19/2022   Lab Results  Component Value Date    FERRITIN 11 09/23/2021   IRON 56 09/23/2021   TIBC 413 09/23/2021   UIBC 357 09/23/2021   IRONPCTSAT 13 (L) 09/23/2021   Lab Results  Component Value Date   RBC 3.97 03/19/2022   No results found for: KPAFRELGTCHN, LAMBDASER, KAPLAMBRATIO No results found for: IGGSERUM, IGA, IGMSERUM No results found for: Odetta Pink, SPEI   Chemistry      Component Value Date/Time   NA 137 03/19/2022 1112   NA 143 10/08/2017 1042   NA 141 06/16/2017 1014   K 3.8 03/19/2022 1112   K 3.4 10/08/2017 1042   K 3.8 06/16/2017 1014   CL 98 03/19/2022 1112   CL 100 10/08/2017 1042   CO2 31 03/19/2022 1112   CO2 30 10/08/2017 1042   CO2 31 (H) 06/16/2017 1014   BUN 15 03/19/2022 1112   BUN 10 10/08/2017 1042   BUN 8.9 06/16/2017 1014   CREATININE 0.88 03/19/2022 1112   CREATININE 0.9 10/08/2017 1042   CREATININE 0.8 06/16/2017 1014      Component Value Date/Time   CALCIUM 9.8 03/19/2022 1112   CALCIUM 9.5 10/08/2017 1042   CALCIUM 10.1 06/16/2017 1014   ALKPHOS 66 03/19/2022 1112   ALKPHOS 68 10/08/2017 1042   ALKPHOS 64 06/16/2017 1014   AST 17 03/19/2022 1112   AST 21 06/16/2017 1014   ALT 15 03/19/2022 1112   ALT 20 10/08/2017 1042   ALT 20 06/16/2017 1014   BILITOT 0.5 03/19/2022 1112   BILITOT 0.37 06/16/2017 1014      Impression and Plan: Jessica [name] is a very pleasant 81 yo caucasian female with stage IIA ductal carcinoma of the left breast.  Her breast cancer is TRIPLE NEGATIVE.    She had her partial mastectomy in May 2018 followed by chemotherapy and radiation.  She completed chemotherapy in November 2018.  She then had radiation and completed radiation in January 2019.  For right now, I do not see any evidence of recurrent breast cancer.  I do not think that the left shoulder issue is anything related to breast cancer.  We will still plan to get her back  to see [place] in 6 months.  She has a really good family that does a  fantastic job in taking care of her.  She is very fortunate.   [name], MD 5/18/202312:45 PM

## 2022-03-20 ENCOUNTER — Emergency Department (HOSPITAL_COMMUNITY): Payer: Medicare Other

## 2022-03-20 ENCOUNTER — Other Ambulatory Visit: Payer: Self-pay

## 2022-03-20 ENCOUNTER — Encounter (HOSPITAL_COMMUNITY): Payer: Self-pay

## 2022-03-20 ENCOUNTER — Inpatient Hospital Stay (HOSPITAL_COMMUNITY)
Admission: EM | Admit: 2022-03-20 | Discharge: 2022-03-23 | DRG: 066 | Disposition: A | Discharge status: Rehabilitation Facility | Payer: Medicare Other | Attending: Family Medicine | Admitting: Family Medicine

## 2022-03-20 DIAGNOSIS — J45909 Unspecified asthma, uncomplicated: Secondary | ICD-10-CM | POA: Diagnosis present

## 2022-03-20 DIAGNOSIS — I700 Atherosclerosis of aorta: Secondary | ICD-10-CM | POA: Diagnosis present

## 2022-03-20 DIAGNOSIS — R4182 Altered mental status, unspecified: Principal | ICD-10-CM

## 2022-03-20 DIAGNOSIS — G9340 Encephalopathy, unspecified: Secondary | ICD-10-CM | POA: Diagnosis present

## 2022-03-20 DIAGNOSIS — R29704 NIHSS score 4: Secondary | ICD-10-CM | POA: Diagnosis present

## 2022-03-20 DIAGNOSIS — E669 Obesity, unspecified: Secondary | ICD-10-CM | POA: Diagnosis present

## 2022-03-20 DIAGNOSIS — Z90710 Acquired absence of both cervix and uterus: Secondary | ICD-10-CM

## 2022-03-20 DIAGNOSIS — Z923 Personal history of irradiation: Secondary | ICD-10-CM

## 2022-03-20 DIAGNOSIS — G8321 Monoplegia of upper limb affecting right dominant side: Secondary | ICD-10-CM | POA: Diagnosis present

## 2022-03-20 DIAGNOSIS — Z9221 Personal history of antineoplastic chemotherapy: Secondary | ICD-10-CM

## 2022-03-20 DIAGNOSIS — I639 Cerebral infarction, unspecified: Secondary | ICD-10-CM | POA: Diagnosis present

## 2022-03-20 DIAGNOSIS — R4781 Slurred speech: Secondary | ICD-10-CM | POA: Diagnosis present

## 2022-03-20 DIAGNOSIS — Z8249 Family history of ischemic heart disease and other diseases of the circulatory system: Secondary | ICD-10-CM

## 2022-03-20 DIAGNOSIS — Z87440 Personal history of urinary (tract) infections: Secondary | ICD-10-CM

## 2022-03-20 DIAGNOSIS — R270 Ataxia, unspecified: Secondary | ICD-10-CM | POA: Diagnosis present

## 2022-03-20 DIAGNOSIS — R29810 Facial weakness: Secondary | ICD-10-CM | POA: Diagnosis present

## 2022-03-20 DIAGNOSIS — F418 Other specified anxiety disorders: Secondary | ICD-10-CM | POA: Diagnosis not present

## 2022-03-20 DIAGNOSIS — Z880 Allergy status to penicillin: Secondary | ICD-10-CM

## 2022-03-20 DIAGNOSIS — I10 Essential (primary) hypertension: Secondary | ICD-10-CM | POA: Diagnosis present

## 2022-03-20 DIAGNOSIS — K2100 Gastro-esophageal reflux disease with esophagitis, without bleeding: Secondary | ICD-10-CM

## 2022-03-20 DIAGNOSIS — Z853 Personal history of malignant neoplasm of breast: Secondary | ICD-10-CM

## 2022-03-20 DIAGNOSIS — F419 Anxiety disorder, unspecified: Secondary | ICD-10-CM | POA: Diagnosis present

## 2022-03-20 DIAGNOSIS — Z885 Allergy status to narcotic agent status: Secondary | ICD-10-CM

## 2022-03-20 DIAGNOSIS — Z9012 Acquired absence of left breast and nipple: Secondary | ICD-10-CM

## 2022-03-20 DIAGNOSIS — Z6832 Body mass index (BMI) 32.0-32.9, adult: Secondary | ICD-10-CM

## 2022-03-20 DIAGNOSIS — E876 Hypokalemia: Secondary | ICD-10-CM

## 2022-03-20 DIAGNOSIS — F32A Depression, unspecified: Secondary | ICD-10-CM | POA: Diagnosis present

## 2022-03-20 DIAGNOSIS — Z91199 Patient's noncompliance with other medical treatment and regimen due to unspecified reason: Secondary | ICD-10-CM

## 2022-03-20 DIAGNOSIS — E039 Hypothyroidism, unspecified: Secondary | ICD-10-CM | POA: Diagnosis present

## 2022-03-20 DIAGNOSIS — R471 Dysarthria and anarthria: Secondary | ICD-10-CM | POA: Diagnosis present

## 2022-03-20 DIAGNOSIS — E785 Hyperlipidemia, unspecified: Secondary | ICD-10-CM | POA: Diagnosis present

## 2022-03-20 DIAGNOSIS — Z886 Allergy status to analgesic agent status: Secondary | ICD-10-CM

## 2022-03-20 DIAGNOSIS — K219 Gastro-esophageal reflux disease without esophagitis: Secondary | ICD-10-CM | POA: Diagnosis present

## 2022-03-20 DIAGNOSIS — Z8042 Family history of malignant neoplasm of prostate: Secondary | ICD-10-CM

## 2022-03-20 DIAGNOSIS — G4733 Obstructive sleep apnea (adult) (pediatric): Secondary | ICD-10-CM | POA: Diagnosis present

## 2022-03-20 DIAGNOSIS — Z888 Allergy status to other drugs, medicaments and biological substances status: Secondary | ICD-10-CM

## 2022-03-20 DIAGNOSIS — K5900 Constipation, unspecified: Secondary | ICD-10-CM | POA: Diagnosis present

## 2022-03-20 DIAGNOSIS — I6381 Other cerebral infarction due to occlusion or stenosis of small artery: Principal | ICD-10-CM | POA: Diagnosis present

## 2022-03-20 DIAGNOSIS — R4701 Aphasia: Secondary | ICD-10-CM | POA: Diagnosis present

## 2022-03-20 DIAGNOSIS — Z79899 Other long term (current) drug therapy: Secondary | ICD-10-CM

## 2022-03-20 DIAGNOSIS — Z882 Allergy status to sulfonamides status: Secondary | ICD-10-CM

## 2022-03-20 DIAGNOSIS — Z79890 Hormone replacement therapy (postmenopausal): Secondary | ICD-10-CM

## 2022-03-20 LAB — CBG MONITORING, ED: Glucose-Capillary: 132 mg/dL — ABNORMAL HIGH (ref 70–99)

## 2022-03-20 LAB — CBC WITH DIFFERENTIAL/PLATELET
Abs Immature Granulocytes: 0.01 10*3/uL (ref 0.00–0.07)
Basophils Absolute: 0 10*3/uL (ref 0.0–0.1)
Basophils Relative: 0 %
Eosinophils Absolute: 0.2 10*3/uL (ref 0.0–0.5)
Eosinophils Relative: 3 %
HCT: 37.7 % (ref 36.0–46.0)
Hemoglobin: 12.7 g/dL (ref 12.0–15.0)
Immature Granulocytes: 0 %
Lymphocytes Relative: 20 %
Lymphs Abs: 1.3 10*3/uL (ref 0.7–4.0)
MCH: 33.6 pg (ref 26.0–34.0)
MCHC: 33.7 g/dL (ref 30.0–36.0)
MCV: 99.7 fL (ref 80.0–100.0)
Monocytes Absolute: 0.5 10*3/uL (ref 0.1–1.0)
Monocytes Relative: 8 %
Neutro Abs: 4.6 10*3/uL (ref 1.7–7.7)
Neutrophils Relative %: 69 %
Platelets: 256 10*3/uL (ref 150–400)
RBC: 3.78 MIL/uL — ABNORMAL LOW (ref 3.87–5.11)
RDW: 13.3 % (ref 11.5–15.5)
WBC: 6.7 10*3/uL (ref 4.0–10.5)
nRBC: 0 % (ref 0.0–0.2)

## 2022-03-20 LAB — URINALYSIS, ROUTINE W REFLEX MICROSCOPIC
Bacteria, UA: NONE SEEN
Bilirubin Urine: NEGATIVE
Glucose, UA: NEGATIVE mg/dL
Ketones, ur: NEGATIVE mg/dL
Nitrite: NEGATIVE
Protein, ur: NEGATIVE mg/dL
Specific Gravity, Urine: 1.009 (ref 1.005–1.030)
pH: 6 (ref 5.0–8.0)

## 2022-03-20 LAB — COMPREHENSIVE METABOLIC PANEL
ALT: 16 U/L (ref 0–44)
AST: 22 U/L (ref 15–41)
Albumin: 3.7 g/dL (ref 3.5–5.0)
Alkaline Phosphatase: 65 U/L (ref 38–126)
Anion gap: 11 (ref 5–15)
BUN: 12 mg/dL (ref 8–23)
CO2: 27 mmol/L (ref 22–32)
Calcium: 9.3 mg/dL (ref 8.9–10.3)
Chloride: 99 mmol/L (ref 98–111)
Creatinine, Ser: 0.78 mg/dL (ref 0.44–1.00)
GFR, Estimated: >60 mL/min (ref >60)
Glucose, Bld: 122 mg/dL — ABNORMAL HIGH (ref 70–99)
Potassium: 3.3 mmol/L — ABNORMAL LOW (ref 3.5–5.1)
Sodium: 137 mmol/L (ref 135–145)
Total Bilirubin: 0.4 mg/dL (ref 0.3–1.2)
Total Protein: 6.7 g/dL (ref 6.5–8.1)

## 2022-03-20 LAB — RAPID URINE DRUG SCREEN, HOSP PERFORMED
Amphetamines: NOT DETECTED
Barbiturates: NOT DETECTED
Benzodiazepines: NOT DETECTED
Cocaine: NOT DETECTED
Opiates: NOT DETECTED
Tetrahydrocannabinol: NOT DETECTED

## 2022-03-20 LAB — AMMONIA: Ammonia: 16 umol/L (ref 9–35)

## 2022-03-20 LAB — TSH: TSH: 3.986 u[IU]/mL (ref 0.350–4.500)

## 2022-03-20 LAB — ETHANOL: Alcohol, Ethyl (B): <10 mg/dL (ref <10)

## 2022-03-20 MED ORDER — PANTOPRAZOLE SODIUM 40 MG PO TBEC
40.0000 mg | DELAYED_RELEASE_TABLET | Freq: Every day | ORAL | Status: DC
  Administered 2022-03-21 – 2022-03-23 (×3): 40 mg via ORAL
  Filled 2022-03-20 (×3): qty 1

## 2022-03-20 MED ORDER — SODIUM CHLORIDE 0.9% FLUSH
3.0000 mL | Freq: Two times a day (BID) | INTRAVENOUS | Status: DC
  Administered 2022-03-20 – 2022-03-22 (×5): 3 mL via INTRAVENOUS

## 2022-03-20 MED ORDER — LEVOTHYROXINE SODIUM 75 MCG PO TABS
75.0000 ug | ORAL_TABLET | Freq: Every day | ORAL | Status: DC
  Administered 2022-03-21 – 2022-03-23 (×3): 75 ug via ORAL
  Filled 2022-03-20 (×3): qty 1

## 2022-03-20 MED ORDER — CHLORHEXIDINE GLUCONATE CLOTH 2 % EX PADS
6.0000 | MEDICATED_PAD | Freq: Every day | CUTANEOUS | Status: DC
  Administered 2022-03-22 – 2022-03-23 (×2): 6 via TOPICAL

## 2022-03-20 MED ORDER — POLYETHYLENE GLYCOL 3350 17 G PO PACK
17.0000 g | PACK | Freq: Every day | ORAL | Status: DC | PRN
  Administered 2022-03-22: 17 g via ORAL
  Filled 2022-03-20: qty 1

## 2022-03-20 MED ORDER — ACETAMINOPHEN 650 MG RE SUPP: 650.0000 mg | Freq: Four times a day (QID) | RECTAL | Status: DC | PRN

## 2022-03-20 MED ORDER — SODIUM CHLORIDE 0.9% FLUSH
10.0000 mL | INTRAVENOUS | Status: DC | PRN
  Administered 2022-03-21: 10 mL

## 2022-03-20 MED ORDER — ACETAMINOPHEN 325 MG PO TABS: 650.0000 mg | ORAL_TABLET | Freq: Four times a day (QID) | ORAL | Status: DC | PRN

## 2022-03-20 NOTE — ED Notes (Signed)
Per family, pt prefers port access over IV d/t hx of difficulty getting IV. This RN ordered IV team to access port and pull for blood.

## 2022-03-20 NOTE — H&P (Signed)
History and Physical   [name] GXQ:[id number] DOB: 30-May-1941 DOA: 03/20/2022  PCP: [name], MD   Patient coming from: Home  Chief Complaint: Slurred speech  HPI: Jessica [name] is a 81 y.o. female with medical history significant of hypertension, hyperlipidemia, depression, anxiety, obesity, GERD, hypothyroidism, OSA, asthma, ACE inhibitor induced angioedema, anemia, breast cancer presenting with slurred speech and confusion.  History obtained with assistance of patient's daughter.  Last known normal was last night per daughter.  Today patient noted to be weaker and unable to stand or walk unassisted which is new for her.  She does live alone and is able to do her ADLs at baseline and not needing assistance with standing or walking.  Daughter also reported some slurred speech and confusion with some right facial droop.  No other focal deficits reported.  Patient denies fevers, chills, chest pain, shortness of breath, Abdominal pain, constipation, diarrhea, nausea, vomiting.  ED Course: Vital signs in the ED significant for blood pressure in the 144Y to 185 systolic.  Lab work-up included CMP with potassium 3.3, glucose 122.  CBC within normal limits.  Urinalysis with moderate hemoglobin and trace leukocytes only.  Ethanol level negative, ammonia level negative, UDS negative, TSH normal.  CT head with chronic ischemic changes but no acute normality.  MR brain pending.  Review of Systems: As per HPI otherwise all other systems reviewed and are negative.  Past Medical History:  Diagnosis Date   Allergy    Anemia    Anxiety    Asthma    in past, no inhalers now   Blood transfusion without reported diagnosis    Breast cancer ([place]) 01/2017   left breast    Breast cancer of upper-inner quadrant of left female breast ([place]) 03/11/2017   Cancer ([place]) 01/2017   left breast   Cataract    bilateral removed   Chronic kidney disease    kidney stones   Depression 08/31/2013    Dyslipidemia, goal LDL below 160 08/31/2013   Essential hypertension - well controlled 08/31/2013   Family history of breast cancer    Family history of melanoma    Family history of prostate cancer    GERD (gastroesophageal reflux disease)    Goals of care, counseling/discussion 03/11/2017   History of radiation therapy 10/13/17-11/11/17   left breast 40.05 Gy in 15 fractions, left breast boost 10 Gy in 5 fractions   Hypothyroidism    Obesity (BMI 30-39.9) 08/31/2013   Personal history of chemotherapy 2018   Personal history of radiation therapy 2019   Thyroid disease    Vasovagal near-syncope -rare 08/31/2013    Past Surgical History:  Procedure Laterality Date   ABDOMINAL HYSTERECTOMY     total   BREAST BIOPSY Left 02/24/2017    malignant   BREAST LUMPECTOMY Left 03/23/2017   broken fingers     CATARACT EXTRACTION Bilateral    COLONOSCOPY     EXCISIONAL HEMORRHOIDECTOMY     IR FLUORO GUIDE PORT INSERTION RIGHT  08/04/2017   IR REMOVAL TUN ACCESS W/ PORT W/O FL MOD SED  08/04/2017   IR US GUIDE VASC ACCESS RIGHT  08/04/2017   IRRIGATION AND DEBRIDEMENT ABSCESS Left 05/18/2017   Procedure: IRRIGATION AND DEBRIDEMENT LEFT AXILLARY ABSCESS;  Surgeon: Michael Boston, MD;  Location: [facility];  Service: General;  Laterality: Left;   kidney stones lithotripsy     PORTACATH PLACEMENT Right 03/23/2017   Procedure: INSERTION PORT-A-CATH;  Surgeon: Erroll Luna, MD;  Location: [facility];  Service: General;  Laterality: Right;   RADIOACTIVE SEED GUIDED PARTIAL MASTECTOMY WITH AXILLARY SENTINEL LYMPH NODE BIOPSY Left 03/23/2017   Procedure: LEFT BREAST RADIOACTIVE SEED GUIDED PARTIAL MASTECTOMY AND  SENTINEL LYMPH NODE MAPPING;  Surgeon: Erroll Luna, MD;  Location: [facility];  Service: General;  Laterality: Left;   ROTATOR CUFF REPAIR     left    TONSILLECTOMY     WISDOM TOOTH EXTRACTION      Social History  reports that she has never smoked. She has  never used smokeless tobacco. She reports that she does not drink alcohol and does not use drugs.  Allergies  Allergen Reactions   Advil [Ibuprofen] Hives and Itching   Codeine Hives   Penicillins Hives    Has patient had a PCN reaction causing immediate rash, facial/tongue/throat swelling, SOB or lightheadedness with hypotension: yes Has patient had a PCN reaction causing severe rash involving mucus membranes or skin necrosis:  no Has patient had a PCN reaction that required hospitalization:  no Has patient had a PCN reaction occurring within the last 10 years: no If all of the above answers are "NO", then may proceed with Cephalosporin use. Has patient had a PCN reaction causing immediate rash, facial/tongue/throat swelling, SOB or lightheadedness with hypotension: yes Has patient had a PCN reaction causing severe rash involving mucus membranes or skin necrosis:  no Has patient had a PCN reaction that required hospitalization:  no Has patient had a PCN reaction occurring within the last 10 years: no If all of the above answers are "NO", then may proceed with Cephalosporin use.    Sulfamethoxazole-Trimethoprim Hives   Band-Aid Plus Antibiotic [Bacitracin-Polymyxin B] Dermatitis   Benazepril Swelling    Possible cause of tongue swelling   Other Rash    Band-aid    Family History  Problem Relation Age of Onset   Heart disease Mother        Angina   Heart disease Father    Cervical cancer Maternal Aunt    Heart attack Brother        Died of MI in his mid 42s   Prostate cancer Brother        dx in his early 62s   Breast cancer Paternal Aunt        dx over 18   Bone cancer Cousin        maternal first cousin dx in her 51s-60s   Lung cancer Cousin        paternal first cousin   Colon cancer Neg Hx    Pancreatic cancer Neg Hx    Stomach cancer Neg Hx    Esophageal cancer Neg Hx    Rectal cancer Neg Hx   Reviewed on admission  Prior to Admission medications   Medication Sig  Start Date End Date Taking? Authorizing Provider  buPROPion (WELLBUTRIN XL) 150 MG 24 hr tablet Take 150 mg by mouth every morning. 03/19/22  Yes [provider]  cholecalciferol (VITAMIN D3) 25 MCG (1000 UNIT) tablet Take 1,000 Units by mouth daily.   Yes [provider]  hydrochlorothiazide (MICROZIDE) 12.5 MG capsule Take 12.5 mg by mouth daily. 02/28/16  Yes [provider]  levothyroxine (SYNTHROID, LEVOTHROID) 75 MCG tablet Take 75 mcg by mouth daily. 07/19/13  Yes [provider]  mirtazapine (REMERON) 7.5 MG tablet Take 7.5 mg by mouth at bedtime. 08/13/21  Yes [provider]  Multiple Vitamin (MULTIVITAMIN) tablet Take 1 tablet by mouth  daily.   Yes [provider]  Multiple Vitamins-Minerals (OCUVITE ADULT FORMULA PO) Take by mouth daily.   Yes [provider]  omeprazole (PRILOSEC) 20 MG capsule Take 1 capsule by mouth 2 (two) times daily.   Yes [provider]  venlafaxine XR (EFFEXOR-XR) 75 MG 24 hr capsule Take 225 mg by mouth daily.   Yes [provider]  lactulose ([place]) 10 GM/15ML solution TAKE 30 ML BY MOUTH EVERY 4 HOURS AS NEEDED FOR MILD CONSTIPATION UNTIL BOWEL MOVEMENT Patient not taking: Reported on 03/20/2022 09/13/17   [provider]  lactulose, encephalopathy, (GENERLAC) 10 GM/15ML SOLN TAKE 30 ML BY MOUTH EVERY 4 HOURS AS NEEDED FOR MILD CONSTIPATION UNTIL BOWEL MOVEMENT Patient not taking: Reported on 03/20/2022 02/25/22   [name], MD  lidocaine-prilocaine (EMLA) cream Apply 1 application topically as needed. Patient not taking: Reported on 03/20/2022 08/13/21   [name], MD  Lysine 1000 MG TABS Take 1,000 mg by mouth as needed. Patient not taking: Reported on 03/14/2021    [provider]  omeprazole (PRILOSEC) 20 MG capsule 2 (two) times daily before a meal. Patient not taking: Reported on 03/20/2022 06/24/17   [provider]  potassium chloride  SA (KLOR-CON) 20 MEQ tablet Take 1 tablet (20 mEq total) by mouth daily. Patient not taking: Reported on 03/20/2022 03/14/21   [name], MD    Physical Exam: Vitals:   03/20/22 2046 03/20/22 2123 03/20/22 2128 03/20/22 2130  BP:    (!) 158/65  Pulse: 73  69 67  Resp:   17 16  Temp:  98.6 F (37 C)    TempSrc:  Oral    SpO2: 95%  97% 96%  Weight:      Height:        Physical Exam Constitutional:      General: She is not in acute distress.    Appearance: Normal appearance.  HENT:     Head: Normocephalic and atraumatic.     Mouth/Throat:     Mouth: Mucous membranes are moist.     Pharynx: Oropharynx is clear.  Eyes:     Extraocular Movements: Extraocular movements intact.     Pupils: Pupils are equal, round, and reactive to light.  Cardiovascular:     Rate and Rhythm: Normal rate and regular rhythm.     Pulses: Normal pulses.     Heart sounds: Normal heart sounds.  Pulmonary:     Effort: Pulmonary effort is normal. No respiratory distress.     Breath sounds: Normal breath sounds.  Abdominal:     General: Bowel sounds are normal. There is no distension.     Palpations: Abdomen is soft.     Tenderness: There is no abdominal tenderness.  Musculoskeletal:        General: No swelling or deformity.  Skin:    General: Skin is warm and dry.  Neurological:     Comments: Mental Status: Patient is awake, alert, oriented x3 Some mild expressive aphasia with word findind difficulty at times Cranial Nerves: II: Pupils equal, round, and reactive to light.   III,IV, VI: EOMI without ptosis or diploplia.  V: Facial sensation is symmetric to light touch. VII: Slurred speech. Right facial droop, largely improve with smile, though maybe not entirely. VIII: hearing is intact to voice X: Uvula elevates symmetrically XI: Shoulder shrug is symmetric. XII: tongue is midline without atrophy or fasciculations.  Motor: Good effort thorughout, at Least 4-5/5 bilateral UE, 4-5/5  bilateral lower extremitiy  Sensory: Sensation is grossly intact bilateral UEs & LEs Cerebellar: Finger-Nose intact bilalat   Labs on Admission: I have personally reviewed following labs and imaging studies  CBC: Recent Labs  Lab 03/19/22 1112 03/20/22 2106  WBC 6.3 6.7  NEUTROABS 4.3 4.6  HGB 13.3 12.7  HCT 39.7 37.7  MCV 100.0 99.7  PLT 272 101    Basic Metabolic Panel: Recent Labs  Lab 03/19/22 1112 03/20/22 2106  NA 137 137  K 3.8 3.3*  CL 98 99  CO2 31 27  GLUCOSE 102* 122*  BUN 15 12  CREATININE 0.88 0.78  CALCIUM 9.8 9.3    GFR: Estimated Creatinine Clearance: 56.8 mL/min (by C-G formula based on SCr of 0.78 mg/dL).  Liver Function Tests: Recent Labs  Lab 03/19/22 1112 03/20/22 2106  AST 17 22  ALT 15 16  ALKPHOS 66 65  BILITOT 0.5 0.4  PROT 7.5 6.7  ALBUMIN 4.4 3.7    Urine analysis:    Component Value Date/Time   COLORURINE YELLOW 03/20/2022 2101   APPEARANCEUR CLEAR 03/20/2022 2101   LABSPEC 1.009 03/20/2022 2101   PHURINE 6.0 03/20/2022 2101   GLUCOSEU NEGATIVE 03/20/2022 2101   HGBUR MODERATE (A) 03/20/2022 2101   BILIRUBINUR NEGATIVE 03/20/2022 2101   [place] NEGATIVE 03/20/2022 2101   PROTEINUR NEGATIVE 03/20/2022 2101   NITRITE NEGATIVE 03/20/2022 2101   LEUKOCYTESUR TRACE (A) 03/20/2022 2101    Radiological Exams on Admission: CT Head Wo Contrast  Result Date: 03/20/2022 CLINICAL DATA:  Altered mental status and right facial droop EXAM: CT HEAD WITHOUT CONTRAST TECHNIQUE: Contiguous axial images were obtained from the base of the skull through the vertex without intravenous contrast. RADIATION DOSE REDUCTION: This exam was performed according to the departmental dose-optimization program which includes automated exposure control, adjustment of the mA and/or kV according to patient size and/or use of iterative reconstruction technique. COMPARISON:  12/31/2021 FINDINGS: Brain: Chronic atrophic changes are noted. Old lacunar infarct  in the right internal capsule, right basal ganglia and adjacent to the head of the caudate nucleus is seen. No acute hemorrhage or acute infarction is noted. Vascular: No hyperdense vessel or unexpected calcification. Skull: Normal. Negative for fracture or focal lesion. Sinuses/Orbits: No acute finding. Other: None. IMPRESSION: Chronic ischemic change without acute abnormality. Electronically Signed   By: Inez Catalina M.D.   On: 03/20/2022 22:36    EKG: Independently reviewed.  Sinus rhythm at 70 beats minute.  Right bundle branch block.  Similar to previous.  Assessment/Plan Principal Problem:   Acute encephalopathy Active Problems:   Essential hypertension - well controlled   Obesity (BMI 30-39.9)   Dyslipidemia, goal LDL below 160   GERD (gastroesophageal reflux disease)   Hypothyroidism   Depression with anxiety   Obstructive sleep apnea   Asthma   Personal history of malignant neoplasm of breast   Acute encephalopathy Rule out CVA > Patient presenting with some confusion, slurred speech, reported right facial droop. > Last normal yesterday evening noted to be significantly weak and have above symptoms as well. > No other focal deficits noted, did not comment as a code stroke.  MRI pending and will consult neurology pending results of this. > As far as other acute encephalopathy work-up goes no evidence of infection with normal CBC, only moderate hemoglobin and trace leukocytes on urinalysis.  Electrolytes showing mildly low potassium at 3.3.  Will check magnesium as well.  CT head with only chronic ischemic changes and no acute abnormality.  Ethanol level, ammonia level, UA TSH level normal.  UDS negative. > On exam patient noted to have some mild aphasia with word finding difficulty and confusion.  Facial droop improves with smiling.  Slurred speech is noted as well. > Patient to be observed overnight for encephalopathy versus stroke pending MRI results. Did have ED evaluation for  transient abnormal behavior in march with negative work-up. Speech and facial findings concerning for CVA however global weakness is not consistent with typical CVA findings. > Focal seizure could present atypically with aphasia, consider eeg if MRI negative. - Monitor on telemetry - Follow-up MRI, if positive for acute CVA, consult neurology and proceed with stroke work-up - Check magnesium - Stroke swallow study - Supportive care - Consider further work-up if patient fails to improve and MRI negative for stroke - Hold home centrally acting medications  Hypertension - Holding home antihypertensives in in case this is stroke, pending MRI  Depression Anxiety - Holding home centrally acting medications in the setting of encephalopathy as above.  Holding Wellbutrin, mirtazapine, venlafaxine.  GERD - Continue PPI  Hypothyroidism - Continue home Synthroid  History of breast cancer > Status post partial mastoidectomy, chemo, radiation.  In remission. - Noted work-up  DVT prophylaxis: SCDs for now pending MRI results Code Status:   Full Family Communication:  Updated at beside  Disposition Plan:   Patient is from:  Home  Anticipated DC to:  Home  Anticipated DC date:  1 to 3 days  Anticipated DC barriers: none  Consults called:  None, may need neurology consult pending MRI result Admission status:  Observation, telemetry  Severity of Illness: The appropriate patient status for this patient is OBSERVATION. Observation status is judged to be reasonable and necessary in order to provide the required intensity of service to ensure the patient's safety. The patient's presenting symptoms, physical exam findings, and initial radiographic and laboratory data in the context of their medical condition is felt to place them at decreased risk for further clinical deterioration. Furthermore, it is anticipated that the patient will be medically stable for discharge from the hospital within 2  midnights of admission.    Marcelyn Bruins MD [facility]  How to contact the [facility] Attending or Consulting provider [place] or covering provider during after hours [place], for this patient?   Check the care team in [facility] and look for a) attending/consulting [facility] provider listed and b) the [facility] team listed Log into [email or web address] and use [place]'s universal password to access. If you do not have the password, please contact the hospital operator. Locate the [facility] provider you are looking for under [facility] and page to a number that you can be directly reached. If you still have difficulty reaching the provider, please page the [facility] (Director on Call) for the Hospitalists listed on amion for assistance.  03/20/2022, 11:24 PM

## 2022-03-20 NOTE — ED Notes (Signed)
Patient transported to CT 

## 2022-03-20 NOTE — ED Triage Notes (Signed)
Pt BIB [facility] from home after family called out with concerns of AMS and stroke like sx. Pt LKN 2100 5/18. Per family, pt is having slurred speech and R sided facial droop.

## 2022-03-20 NOTE — ED Provider Notes (Signed)
[facility] EMERGENCY DEPARTMENT Provider Note   CSN: [id number] Arrival date & time: 03/20/22  2030     History  Chief Complaint  Patient presents with   Altered Mental Status    Jessica [name] is a 81 y.o. female with a history of breast cancer, HTN, asthma, CKD, hypothyroidism, and recurrent UTIs presenting to the ED with slurred speech.  Patient's daughter at the bedside provides much of the history.  She states that her mother was last seen completely normal around 9 PM last night.  This morning, patient's daughter noted that the patient appeared weaker than usual and unable to stand/ambulate on her own.  Patient lives alone and is typically able to perform all of her own ADLs and does not require any assistance with ambulation.  She also noted that the patient appeared to have slightly slurred speech.  Later this afternoon, she noted worsening of the slurred speech as well as some associated confusion.  She also thought she was having some right-sided facial droop so she called EMS and patient was brought to the ED.  In the ED, patient states that she is here because her legs are too weak to keep her standing.  She denies any chest pain, shortness of breath, abdominal pain, fever, headache.  Denies any recent fall/trauma.   Altered Mental Status Presenting symptoms: confusion   Associated symptoms: weakness   Associated symptoms: no abdominal pain, no fever, no light-headedness, no seizures and no vomiting       Home Medications Prior to Admission medications   Medication Sig Start Date End Date Taking? Authorizing Provider  buPROPion (WELLBUTRIN XL) 150 MG 24 hr tablet Take 150 mg by mouth every morning. 03/19/22  Yes [provider]  cholecalciferol (VITAMIN D3) 25 MCG (1000 UNIT) tablet Take 1,000 Units by mouth daily.   Yes [provider]  hydrochlorothiazide (MICROZIDE) 12.5 MG capsule Take 12.5 mg by mouth daily. 02/28/16  Yes [provider]  levothyroxine (SYNTHROID, LEVOTHROID) 75 MCG tablet Take 75 mcg by mouth daily. 07/19/13  Yes [provider]  mirtazapine (REMERON) 7.5 MG tablet Take 7.5 mg by mouth at bedtime. 08/13/21  Yes [provider]  Multiple Vitamin (MULTIVITAMIN) tablet Take 1 tablet by mouth daily.   Yes [provider]  Multiple Vitamins-Minerals (OCUVITE ADULT FORMULA PO) Take by mouth daily.   Yes [provider]  omeprazole (PRILOSEC) 20 MG capsule Take 1 capsule by mouth 2 (two) times daily.   Yes [provider]  venlafaxine XR (EFFEXOR-XR) 75 MG 24 hr capsule Take 225 mg by mouth daily.   Yes [provider]  lactulose ([place]) 10 GM/15ML solution TAKE 30 ML BY MOUTH EVERY 4 HOURS AS NEEDED FOR MILD CONSTIPATION UNTIL BOWEL MOVEMENT Patient not taking: Reported on 03/20/2022 09/13/17   [provider]  lactulose, encephalopathy, (GENERLAC) 10 GM/15ML SOLN TAKE 30 ML BY MOUTH EVERY 4 HOURS AS NEEDED FOR MILD CONSTIPATION UNTIL BOWEL MOVEMENT Patient not taking: Reported on 03/20/2022 02/25/22   [name], MD  lidocaine-prilocaine (EMLA) cream Apply 1 application topically as needed. Patient not taking: Reported on 03/20/2022 08/13/21   [name], MD  Lysine 1000 MG TABS Take 1,000 mg by mouth as needed. Patient not taking: Reported on 03/14/2021    [provider]  omeprazole (PRILOSEC) 20 MG capsule 2 (two) times daily before a meal. Patient not taking: Reported on 03/20/2022 06/24/17   [provider]  potassium chloride  SA (KLOR-CON) 20 MEQ tablet Take 1 tablet (20 mEq total) by mouth daily. Patient not taking: Reported on 03/20/2022 03/14/21   [name], MD      Allergies    Advil [ibuprofen], Codeine, Penicillins, Sulfamethoxazole-trimethoprim, Band-aid plus antibiotic [bacitracin-polymyxin b], Benazepril, and Other    Review of Systems   Review of Systems  Constitutional:  Negative for  fever.  Respiratory:  Negative for shortness of breath.   Cardiovascular:  Negative for chest pain.  Gastrointestinal:  Negative for abdominal pain and vomiting.  Genitourinary:  Negative for dysuria.  Neurological:  Positive for facial asymmetry, speech difficulty and weakness. Negative for seizures, syncope, light-headedness and numbness.  Psychiatric/Behavioral:  Positive for confusion.    Physical Exam Updated Vital Signs BP (!) 158/65   Pulse 67   Temp 98.6 F (37 C) (Oral)   Resp 16   Ht '5\' 3"'$  (1.6 m)   Wt 81.6 kg   SpO2 96%   BMI 31.89 kg/m  Physical Exam Vitals and nursing note reviewed.  Constitutional:      General: She is not in acute distress.    Appearance: She is obese. She is not toxic-appearing or diaphoretic.     Comments: Elderly and chronically ill appearing.  HENT:     Head: Normocephalic and atraumatic.     Right Ear: External ear normal.     Left Ear: External ear normal.     Nose: Nose normal.     Mouth/Throat:     Mouth: Mucous membranes are moist.     Pharynx: Oropharynx is clear.  Eyes:     General: No scleral icterus.    Extraocular Movements: Extraocular movements intact.     Pupils: Pupils are equal, round, and reactive to light.  Cardiovascular:     Rate and Rhythm: Normal rate and regular rhythm.     Heart sounds: No murmur heard.   No friction rub. No gallop.  Pulmonary:     Effort: Pulmonary effort is normal. No respiratory distress.     Breath sounds: Normal breath sounds. No stridor. No wheezing, rhonchi or rales.  Abdominal:     General: There is no distension.     Palpations: Abdomen is soft.     Tenderness: There is no abdominal tenderness. There is no guarding or rebound.  Musculoskeletal:        General: No deformity.     Cervical back: Neck supple. No rigidity.     Right lower leg: No edema.     Left lower leg: No edema.  Skin:    General: Skin is warm and dry.  Neurological:     Mental Status: She is alert.      Cranial Nerves: No cranial nerve deficit.     Sensory: No sensory deficit.     Coordination: Coordination normal.     Comments: Patient is oriented to person and place.  Initially when asked the year she says 1923 but then corrects herself. Speech is slightly muffled and slurred. At rest, she does appear to have a right-sided facial droop, but this completely resolves with smiling as she does not have any focal facial weakness. 4+/5 strength in all extremities. I did not attempt to ambulate the patient given her lower extremity weakness.    ED Results / Procedures / Treatments   Labs (all labs ordered are listed, but only abnormal results are displayed) Labs Reviewed  CBC WITH DIFFERENTIAL/PLATELET - Abnormal; Notable for the following components:  Result Value   RBC 3.78 (*)    All other components within normal limits  COMPREHENSIVE METABOLIC PANEL - Abnormal; Notable for the following components:   Potassium 3.3 (*)    Glucose, Bld 122 (*)    All other components within normal limits  URINALYSIS, ROUTINE W REFLEX MICROSCOPIC - Abnormal; Notable for the following components:   Hgb urine dipstick MODERATE (*)    Leukocytes,Ua TRACE (*)    All other components within normal limits  CBG MONITORING, ED - Abnormal; Notable for the following components:   Glucose-Capillary 132 (*)    All other components within normal limits  ETHANOL  AMMONIA  TSH  RAPID URINE DRUG SCREEN, HOSP PERFORMED  RAPID URINE DRUG SCREEN, HOSP PERFORMED  MAGNESIUM  COMPREHENSIVE METABOLIC PANEL  CBC    EKG None  Radiology CT Head Wo Contrast  Result Date: 03/20/2022 CLINICAL DATA:  Altered mental status and right facial droop EXAM: CT HEAD WITHOUT CONTRAST TECHNIQUE: Contiguous axial images were obtained from the base of the skull through the vertex without intravenous contrast. RADIATION DOSE REDUCTION: This exam was performed according to the departmental dose-optimization program which includes  automated exposure control, adjustment of the mA and/or kV according to patient size and/or use of iterative reconstruction technique. COMPARISON:  12/31/2021 FINDINGS: Brain: Chronic atrophic changes are noted. Old lacunar infarct in the right internal capsule, right basal ganglia and adjacent to the head of the caudate nucleus is seen. No acute hemorrhage or acute infarction is noted. Vascular: No hyperdense vessel or unexpected calcification. Skull: Normal. Negative for fracture or focal lesion. Sinuses/Orbits: No acute finding. Other: None. IMPRESSION: Chronic ischemic change without acute abnormality. Electronically Signed   By: Inez Catalina M.D.   On: 03/20/2022 22:36    Procedures Procedures    Medications Ordered in ED Medications  sodium chloride flush (NS) 0.9 % injection 10-40 mL (has no administration in time range)  Chlorhexidine Gluconate Cloth 2 % PADS 6 each (has no administration in time range)  pantoprazole (PROTONIX) EC tablet 40 mg (has no administration in time range)  levothyroxine (SYNTHROID) tablet 75 mcg (has no administration in time range)  sodium chloride flush (NS) 0.9 % injection 3 mL (has no administration in time range)  acetaminophen (TYLENOL) tablet 650 mg (has no administration in time range)    Or  acetaminophen (TYLENOL) suppository 650 mg (has no administration in time range)  polyethylene glycol (MIRALAX / GLYCOLAX) packet 17 g (has no administration in time range)    ED Course/ Medical Decision Making/ A&P                           Medical Decision Making Amount and/or Complexity of Data Reviewed Labs: ordered. Radiology: ordered. ECG/medicine tests: ordered.  Risk OTC drugs. Decision regarding hospitalization.   81 year old female with a history of breast cancer, HTN, asthma, CKD, hypothyroidism, and recurrent UTIs presenting to the ED with slurred speech and confusion.  On exam, the patient is afebrile and hemodynamically stable.  Her  speech is slightly slurred and muffled and she does appear slightly confused, but she otherwise has no focal neurologic deficits.  She does appear to have some word finding difficulties.  While at rest, she does appear to have a right-sided facial droop, this completely resolves with smiling and she has no focal facial weakness.  Given her mild aphasia and slurred speech, I am concerned for possible stroke however patient is at within  the 24-hour mark for acute intervention.  Will obtain CT head for further evaluation.  Patient does have a history of recurrent UTIs and does often have confusion related to those.  Other differentials include hyperammonemia, hypoglycemia, intoxication, thyroid problems.  POCT glucose is 132.  UDS is negative.  CBC without leukocytosis and with a normal hemoglobin of 12.7.  CMP with mild hypokalemia to 3.3.  Ethanol is undetectable.  Ammonia is not elevated.  TSH is within normal limits.  UA has trace leukocytes and 6-10 WBCs but no bacteria so low suspicion for acute UTI.  CT head showed chronic ischemic changes without any acute abnormalities.  On reevaluation, patient has not had much improvement in her speech and remains mildly aphasic and with slurred speech.  She is still not at her baseline per her daughters at bedside.  Given the negative CT head, will obtain MRI of the brain to further evaluate.  Patient will require admission given her persistent confusion and inability to ambulate on her own (patient typically performs her own ADLs and ambulates without assistance).  Hospitalist team was contacted for admission the patient was admitted to their service in stable condition.  Final Clinical Impression(s) / ED Diagnoses Final diagnoses:  Altered mental status, unspecified altered mental status type    Rx / DC Orders ED Discharge Orders     None         Sondra Come, MD 03/20/22 3818    Gareth Morgan, MD 03/22/22 1232

## 2022-03-21 ENCOUNTER — Other Ambulatory Visit: Payer: Self-pay

## 2022-03-21 ENCOUNTER — Observation Stay (HOSPITAL_COMMUNITY): Payer: Medicare Other

## 2022-03-21 DIAGNOSIS — I639 Cerebral infarction, unspecified: Secondary | ICD-10-CM | POA: Diagnosis present

## 2022-03-21 DIAGNOSIS — Z90710 Acquired absence of both cervix and uterus: Secondary | ICD-10-CM | POA: Diagnosis not present

## 2022-03-21 DIAGNOSIS — F32A Depression, unspecified: Secondary | ICD-10-CM | POA: Diagnosis present

## 2022-03-21 DIAGNOSIS — I6389 Other cerebral infarction: Secondary | ICD-10-CM

## 2022-03-21 DIAGNOSIS — Z9012 Acquired absence of left breast and nipple: Secondary | ICD-10-CM | POA: Diagnosis not present

## 2022-03-21 DIAGNOSIS — I10 Essential (primary) hypertension: Secondary | ICD-10-CM | POA: Diagnosis present

## 2022-03-21 DIAGNOSIS — R29810 Facial weakness: Secondary | ICD-10-CM | POA: Diagnosis present

## 2022-03-21 DIAGNOSIS — F419 Anxiety disorder, unspecified: Secondary | ICD-10-CM | POA: Diagnosis present

## 2022-03-21 DIAGNOSIS — I69320 Aphasia following cerebral infarction: Secondary | ICD-10-CM | POA: Diagnosis not present

## 2022-03-21 DIAGNOSIS — E876 Hypokalemia: Secondary | ICD-10-CM

## 2022-03-21 DIAGNOSIS — Z853 Personal history of malignant neoplasm of breast: Secondary | ICD-10-CM | POA: Diagnosis not present

## 2022-03-21 DIAGNOSIS — K219 Gastro-esophageal reflux disease without esophagitis: Secondary | ICD-10-CM | POA: Diagnosis present

## 2022-03-21 DIAGNOSIS — G9340 Encephalopathy, unspecified: Secondary | ICD-10-CM | POA: Diagnosis not present

## 2022-03-21 DIAGNOSIS — Z9221 Personal history of antineoplastic chemotherapy: Secondary | ICD-10-CM | POA: Diagnosis not present

## 2022-03-21 DIAGNOSIS — K5901 Slow transit constipation: Secondary | ICD-10-CM | POA: Diagnosis not present

## 2022-03-21 DIAGNOSIS — K5900 Constipation, unspecified: Secondary | ICD-10-CM | POA: Diagnosis present

## 2022-03-21 DIAGNOSIS — I700 Atherosclerosis of aorta: Secondary | ICD-10-CM | POA: Diagnosis present

## 2022-03-21 DIAGNOSIS — G4733 Obstructive sleep apnea (adult) (pediatric): Secondary | ICD-10-CM | POA: Diagnosis present

## 2022-03-21 DIAGNOSIS — Z91199 Patient's noncompliance with other medical treatment and regimen due to unspecified reason: Secondary | ICD-10-CM | POA: Diagnosis not present

## 2022-03-21 DIAGNOSIS — F418 Other specified anxiety disorders: Secondary | ICD-10-CM | POA: Diagnosis not present

## 2022-03-21 DIAGNOSIS — R4781 Slurred speech: Secondary | ICD-10-CM | POA: Diagnosis present

## 2022-03-21 DIAGNOSIS — E039 Hypothyroidism, unspecified: Secondary | ICD-10-CM | POA: Diagnosis present

## 2022-03-21 DIAGNOSIS — G8321 Monoplegia of upper limb affecting right dominant side: Secondary | ICD-10-CM | POA: Diagnosis present

## 2022-03-21 DIAGNOSIS — Z923 Personal history of irradiation: Secondary | ICD-10-CM | POA: Diagnosis not present

## 2022-03-21 DIAGNOSIS — R4701 Aphasia: Secondary | ICD-10-CM | POA: Diagnosis present

## 2022-03-21 DIAGNOSIS — Z8673 Personal history of transient ischemic attack (TIA), and cerebral infarction without residual deficits: Secondary | ICD-10-CM | POA: Diagnosis not present

## 2022-03-21 DIAGNOSIS — I6381 Other cerebral infarction due to occlusion or stenosis of small artery: Secondary | ICD-10-CM | POA: Diagnosis not present

## 2022-03-21 DIAGNOSIS — Z87440 Personal history of urinary (tract) infections: Secondary | ICD-10-CM | POA: Diagnosis not present

## 2022-03-21 DIAGNOSIS — I672 Cerebral atherosclerosis: Secondary | ICD-10-CM | POA: Diagnosis not present

## 2022-03-21 DIAGNOSIS — E669 Obesity, unspecified: Secondary | ICD-10-CM | POA: Diagnosis present

## 2022-03-21 DIAGNOSIS — Z6832 Body mass index (BMI) 32.0-32.9, adult: Secondary | ICD-10-CM | POA: Diagnosis not present

## 2022-03-21 DIAGNOSIS — E785 Hyperlipidemia, unspecified: Secondary | ICD-10-CM | POA: Diagnosis present

## 2022-03-21 LAB — LIPID PANEL
Cholesterol: 297 mg/dL — ABNORMAL HIGH (ref 0–200)
HDL: 49 mg/dL (ref >40)
LDL Cholesterol: 216 mg/dL — ABNORMAL HIGH (ref 0–99)
Total CHOL/HDL Ratio: 6.1 RATIO
Triglycerides: 161 mg/dL — ABNORMAL HIGH (ref <150)
VLDL: 32 mg/dL (ref 0–40)

## 2022-03-21 LAB — BASIC METABOLIC PANEL
Anion gap: 9 (ref 5–15)
BUN: 9 mg/dL (ref 8–23)
CO2: 29 mmol/L (ref 22–32)
Calcium: 9.5 mg/dL (ref 8.9–10.3)
Chloride: 99 mmol/L (ref 98–111)
Creatinine, Ser: 0.66 mg/dL (ref 0.44–1.00)
GFR, Estimated: >60 mL/min (ref >60)
Glucose, Bld: 107 mg/dL — ABNORMAL HIGH (ref 70–99)
Potassium: 3.3 mmol/L — ABNORMAL LOW (ref 3.5–5.1)
Sodium: 137 mmol/L (ref 135–145)

## 2022-03-21 LAB — CBC
HCT: 38 % (ref 36.0–46.0)
Hemoglobin: 12.8 g/dL (ref 12.0–15.0)
MCH: 33.3 pg (ref 26.0–34.0)
MCHC: 33.7 g/dL (ref 30.0–36.0)
MCV: 99 fL (ref 80.0–100.0)
Platelets: 254 10*3/uL (ref 150–400)
RBC: 3.84 MIL/uL — ABNORMAL LOW (ref 3.87–5.11)
RDW: 13.6 % (ref 11.5–15.5)
WBC: 8 10*3/uL (ref 4.0–10.5)
nRBC: 0 % (ref 0.0–0.2)

## 2022-03-21 LAB — HEMOGLOBIN A1C
Hgb A1c MFr Bld: 5.6 % (ref 4.8–5.6)
Mean Plasma Glucose: 114.02 mg/dL

## 2022-03-21 LAB — ECHOCARDIOGRAM COMPLETE BUBBLE STUDY
Area-P 1/2: 2.62 cm2
S' Lateral: 3.1 cm

## 2022-03-21 LAB — MAGNESIUM: Magnesium: 1.9 mg/dL (ref 1.7–2.4)

## 2022-03-21 LAB — PHOSPHORUS: Phosphorus: 3.6 mg/dL (ref 2.5–4.6)

## 2022-03-21 MED ORDER — CLOPIDOGREL BISULFATE 75 MG PO TABS
75.0000 mg | ORAL_TABLET | Freq: Every day | ORAL | Status: DC
  Administered 2022-03-21 – 2022-03-23 (×3): 75 mg via ORAL
  Filled 2022-03-21 (×3): qty 1

## 2022-03-21 MED ORDER — BUPROPION HCL ER (XL) 150 MG PO TB24
150.0000 mg | ORAL_TABLET | Freq: Every morning | ORAL | Status: DC
Start: 2022-03-22 — End: 2022-03-23
  Administered 2022-03-22 – 2022-03-23 (×2): 150 mg via ORAL
  Filled 2022-03-21 (×2): qty 1

## 2022-03-21 MED ORDER — VENLAFAXINE HCL ER 75 MG PO CP24
225.0000 mg | ORAL_CAPSULE | Freq: Every day | ORAL | Status: DC
  Administered 2022-03-21 – 2022-03-23 (×3): 225 mg via ORAL
  Filled 2022-03-21 (×3): qty 1

## 2022-03-21 MED ORDER — IOHEXOL 350 MG/ML SOLN
75.0000 mL | Freq: Once | INTRAVENOUS | Status: AC | PRN
  Administered 2022-03-21: 75 mL via INTRAVENOUS

## 2022-03-21 MED ORDER — MIRTAZAPINE 15 MG PO TABS
7.5000 mg | ORAL_TABLET | Freq: Every day | ORAL | Status: DC
  Administered 2022-03-21 – 2022-03-22 (×2): 7.5 mg via ORAL
  Filled 2022-03-21 (×2): qty 1

## 2022-03-21 MED ORDER — STROKE: EARLY STAGES OF RECOVERY BOOK
Freq: Once | Status: AC
  Filled 2022-03-21: qty 1

## 2022-03-21 MED ORDER — ASPIRIN 325 MG PO TABS
325.0000 mg | ORAL_TABLET | Freq: Once | ORAL | Status: AC
  Administered 2022-03-21: 325 mg via ORAL
  Filled 2022-03-21: qty 1

## 2022-03-21 MED ORDER — ATORVASTATIN CALCIUM 80 MG PO TABS
80.0000 mg | ORAL_TABLET | Freq: Every day | ORAL | Status: DC
  Administered 2022-03-21 – 2022-03-23 (×3): 80 mg via ORAL
  Filled 2022-03-21 (×3): qty 1

## 2022-03-21 MED ORDER — POTASSIUM CHLORIDE 20 MEQ PO PACK
60.0000 meq | PACK | Freq: Once | ORAL | Status: AC
  Administered 2022-03-21: 60 meq via ORAL
  Filled 2022-03-21: qty 3

## 2022-03-21 MED ORDER — SODIUM CHLORIDE 0.9% FLUSH: 10.0000 mL | Freq: Once | INTRAVENOUS | Status: DC

## 2022-03-21 MED ORDER — LABETALOL HCL 5 MG/ML IV SOLN: 5.0000 mg | INTRAVENOUS | Status: DC | PRN

## 2022-03-21 MED ORDER — ASPIRIN 81 MG PO CHEW
81.0000 mg | CHEWABLE_TABLET | Freq: Every day | ORAL | Status: DC
  Administered 2022-03-22 – 2022-03-23 (×2): 81 mg via ORAL
  Filled 2022-03-21 (×2): qty 1

## 2022-03-21 MED ORDER — GADOBUTROL 1 MMOL/ML IV SOLN
8.0000 mL | Freq: Once | INTRAVENOUS | Status: AC | PRN
  Administered 2022-03-21: 8 mL via INTRAVENOUS

## 2022-03-21 NOTE — ED Notes (Signed)
Patient transported to CT 

## 2022-03-21 NOTE — TOC Initial Note (Signed)
Transition of Care [facility]) - Initial/Assessment Note    Patient Details  Name: Jessica [name] MRN: [id number] Date of Birth: Mar 04, 1941  [facility] CM/SW Contact:    Verdell Carmine, RN Phone Number: 03/21/2022, 10:43 AM  Clinical Narrative:                 Patient aphasic  looking at speaker. Daughter in with patient. PT and OT just evaluated and recommended CIR. Discussed potential DME, patient was at her apartment independent driving.  She has no DME at home and has not needed home health.   CM will follow for needs,recommendations, and transitions of care.   Expected Discharge Plan: [place]     Patient Goals and CMS Choice      CIR potential  Expected Discharge Plan and Services Expected Discharge Plan: [place] arrangements for the past 2 months: Apartment                                      Prior Living Arrangements/Services Living arrangements for the past 2 months: Apartment Lives with:: Self Patient language and need for interpreter reviewed:: Yes        Need for Family Participation in Patient Care: Yes (Comment) Care giver support system in place?: Yes (comment)   Criminal Activity/Legal Involvement Pertinent to Current Situation/Hospitalization: No - Comment as needed  Activities of Daily Living      Permission Sought/Granted                  Emotional Assessment Appearance:: Appears stated age     Orientation: :  (Aphasic) Alcohol / Substance Use: Not Applicable Psych Involvement: No (comment)  Admission diagnosis:  Acute encephalopathy [G93.40] Patient Active Problem List   Diagnosis Date Noted   Hypokalemia 03/21/2022   Acute CVA (cerebrovascular accident) ([place]) 03/20/2022   Acquired hammer toe of right foot 08/19/2021   Adverse reaction to ACE-I (angiotensin-converting enzyme inhibitor) 08/19/2021   Allergic rhinitis 08/19/2021   Anxiety 08/19/2021   Asthma 08/19/2021    Cardiomegaly 08/19/2021   Dependence on other enabling machines and devices 08/19/2021   History of adenomatous polyp of colon 08/19/2021   History of pneumonia 08/19/2021   Internal hemorrhoid 08/19/2021   Iron deficiency anemia secondary to inadequate dietary iron intake 08/19/2021   Osteopenia 08/19/2021   Personal history of malignant neoplasm of breast 08/19/2021   Somnolence 08/19/2021   Vitamin D deficiency 08/19/2021   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   Insomnia 06/25/2019   Obstructive sleep apnea 06/13/2019   Genetic testing 06/29/2017   Family history of melanoma    Family history of prostate cancer    Family history of breast cancer    Angioedema 05/26/2017   Left axillary seroma status post incision and drainage 05/18/2017 05/18/2017   Cellulitis of left axilla 05/17/2017   GERD (gastroesophageal reflux disease) 05/16/2017   Hypothyroidism 05/16/2017   Depression with anxiety 05/16/2017   Breast cancer of upper-inner quadrant of left female breast ([place]) 03/11/2017   Goals of care, counseling/discussion 03/11/2017   Obesity (BMI 30-39.9) 08/31/2013   Essential hypertension - well controlled 08/31/2013    Class: Diagnosis of   Depression 08/31/2013    Class: Diagnosis of   Vasovagal near-syncope -rare 08/31/2013    Class: History of   Dyslipidemia, goal LDL below 160 08/31/2013  PCP:  [name], MD Pharmacy:   [facility], [place] - [address] [place] [address] Phone: [phone] Fax: [phone]  [id number] [address]Phone: [phone]: [phone]    Social Determinants of Health (SDOH) Interventions    Readmission Risk Interventions     View : No data to display.

## 2022-03-21 NOTE — Assessment & Plan Note (Signed)
Encephalopathy ruled out.  Symptoms are from stroke.  MR shows left BG infarction.  CTA showed normal carotids, no significant intracranial stenoses.  - Neuro checks, tele - Echocardiogram pending - Lipids ordered: LDL 216, started on high dose Lipitor - Aspirin ordered at admission  - Atrial fibrillation: none prior - tPA not given because outside window - Dysphagia screen ordered in ER - PT eval ordered

## 2022-03-21 NOTE — Consult Note (Signed)
Neurology Consultation  Reason for Consult: stroke Referring Physician: Dr Irene Pap, Hospitalist  CC: weakness and confusion since waking up on Friday 5/19  History is obtained from: Patient, chart, patient's 2 daughters at bedside.  HPI: Jessica [name] is a 81 y.o. female past medical history of triple negative stage IIa ductal carcinoma of the left breast-status post chemotherapy and radiation and partial mastectomy May 2018, in remission for the past 5 years, hyperlipidemia, hypertension, presented to the emergency room for evaluation of confusion and weakness.  Her last known well was when she went to bed at 03/19/2022 after speaking with her daughter 15 PM.  She woke up Friday morning 03/20/2022 feeling somewhat off and weak.  The daughter also noticed that she is slurring her words and maybe has some right arm weakness along with right facial droop.  She was outside the window for IV thrombolysis when brought in for evaluation to the emergency room and was admitted for further work-up.  MRI revealed a left caudate/external capsule stroke.  Neurology was consulted for the stroke.   LKW: 11 PM 03/19/2022 tpa given?: no, outside the window Premorbid modified Rankin scale (mRS): 1-fairly independent, still driving and lives independently with 1 daughter living very close by another family also in drivable vicinity   ROS: Full ROS was performed and is negative except as noted in the HPI  Past Medical History:  Diagnosis Date   Allergy    Anemia    Anxiety    Asthma    in past, no inhalers now   Blood transfusion without reported diagnosis    Breast cancer ([place]) 01/2017   left breast    Breast cancer of upper-inner quadrant of left female breast ([place]) 03/11/2017   Cancer ([place]) 01/2017   left breast   Cataract    bilateral removed   Chronic kidney disease    kidney stones   Depression 08/31/2013   Dyslipidemia, goal LDL below 160 08/31/2013   Essential hypertension - well  controlled 08/31/2013   Family history of breast cancer    Family history of melanoma    Family history of prostate cancer    GERD (gastroesophageal reflux disease)    Goals of care, counseling/discussion 03/11/2017   History of radiation therapy 10/13/17-11/11/17   left breast 40.05 Gy in 15 fractions, left breast boost 10 Gy in 5 fractions   Hypothyroidism    Obesity (BMI 30-39.9) 08/31/2013   Personal history of chemotherapy 2018   Personal history of radiation therapy 2019   Thyroid disease    Vasovagal near-syncope -rare 08/31/2013     Family History  Problem Relation Age of Onset   Heart disease Mother        Angina   Heart disease Father    Cervical cancer Maternal Aunt    Heart attack Brother        Died of MI in his mid 46s   Prostate cancer Brother        dx in his early 59s   Breast cancer Paternal Aunt        dx over 66   Bone cancer Cousin        maternal first cousin dx in her 66s-60s   Lung cancer Cousin        paternal first cousin   Colon cancer Neg Hx    Pancreatic cancer Neg Hx    Stomach cancer Neg Hx    Esophageal cancer Neg Hx    Rectal cancer Neg  Hx    Social History:   reports that she has never smoked. She has never used smokeless tobacco. She reports that she does not drink alcohol and does not use drugs.  Medications  Current Facility-Administered Medications:    acetaminophen (TYLENOL) tablet 650 mg, 650 mg, Oral, Q6H PRN **OR** acetaminophen (TYLENOL) suppository 650 mg, 650 mg, Rectal, Q6H PRN, Marcelyn Bruins, MD   aspirin chewable tablet 81 mg, 81 mg, Oral, Daily, Hall, Carole N, DO   aspirin tablet 325 mg, 325 mg, Oral, Once, Hall, Carole N, DO   atorvastatin (LIPITOR) tablet 80 mg, 80 mg, Oral, Daily, Hall, Carole N, DO   Chlorhexidine Gluconate Cloth 2 % PADS 6 each, 6 each, Topical, Daily, Marcelyn Bruins, MD   labetalol (NORMODYNE) injection 5 mg, 5 mg, Intravenous, Q2H PRN, Hall, Carole N, DO   levothyroxine (SYNTHROID)  tablet 75 mcg, 75 mcg, Oral, Daily, Marcelyn Bruins, MD   pantoprazole (PROTONIX) EC tablet 40 mg, 40 mg, Oral, Daily, Marcelyn Bruins, MD   polyethylene glycol (MIRALAX / GLYCOLAX) packet 17 g, 17 g, Oral, Daily PRN, Marcelyn Bruins, MD   sodium chloride flush (NS) 0.9 % injection 10-40 mL, 10-40 mL, Intracatheter, PRN, Marcelyn Bruins, MD   sodium chloride flush (NS) 0.9 % injection 3 mL, 3 mL, Intravenous, Q12H, Marcelyn Bruins, MD, 3 mL at 03/20/22 2335  Current Outpatient Medications:    buPROPion (WELLBUTRIN XL) 150 MG 24 hr tablet, Take 150 mg by mouth every morning., Disp: , Rfl:    cholecalciferol (VITAMIN D3) 25 MCG (1000 UNIT) tablet, Take 1,000 Units by mouth daily., Disp: , Rfl:    hydrochlorothiazide (MICROZIDE) 12.5 MG capsule, Take 12.5 mg by mouth daily., Disp: , Rfl: 1   levothyroxine (SYNTHROID, LEVOTHROID) 75 MCG tablet, Take 75 mcg by mouth daily., Disp: , Rfl:    mirtazapine (REMERON) 7.5 MG tablet, Take 7.5 mg by mouth at bedtime., Disp: , Rfl:    Multiple Vitamin (MULTIVITAMIN) tablet, Take 1 tablet by mouth daily., Disp: , Rfl:    Multiple Vitamins-Minerals (OCUVITE ADULT FORMULA PO), Take by mouth daily., Disp: , Rfl:    omeprazole (PRILOSEC) 20 MG capsule, Take 1 capsule by mouth 2 (two) times daily., Disp: , Rfl:    venlafaxine XR (EFFEXOR-XR) 75 MG 24 hr capsule, Take 225 mg by mouth daily., Disp: , Rfl:    lactulose (CHRONULAC) 10 GM/15ML solution, TAKE 30 ML BY MOUTH EVERY 4 HOURS AS NEEDED FOR MILD CONSTIPATION UNTIL BOWEL MOVEMENT (Patient not taking: Reported on 03/20/2022), Disp: , Rfl:    lactulose, encephalopathy, (GENERLAC) 10 GM/15ML SOLN, TAKE 30 ML BY MOUTH EVERY 4 HOURS AS NEEDED FOR MILD CONSTIPATION UNTIL BOWEL MOVEMENT (Patient not taking: Reported on 03/20/2022), Disp: 21600 mL, Rfl: 3   lidocaine-prilocaine (EMLA) cream, Apply 1 application topically as needed. (Patient not taking: Reported on 03/20/2022), Disp: 30 g, Rfl: 6   Lysine  1000 MG TABS, Take 1,000 mg by mouth as needed. (Patient not taking: Reported on 03/14/2021), Disp: , Rfl:    omeprazole (PRILOSEC) 20 MG capsule, 2 (two) times daily before a meal. (Patient not taking: Reported on 03/20/2022), Disp: , Rfl:    potassium chloride SA (KLOR-CON) 20 MEQ tablet, Take 1 tablet (20 mEq total) by mouth daily. (Patient not taking: Reported on 03/20/2022), Disp: 14 tablet, Rfl: 0   Exam: Current vital signs: BP (!) 154/66   Pulse 72   Temp 98.6 F (37 C) (Oral)  Resp 19   Ht '5\' 3"'$  (1.6 m)   Wt 81.6 kg   SpO2 93%   BMI 31.89 kg/m  Vital signs in last 24 hours: Temp:  [98.4 F (36.9 C)-98.6 F (37 C)] 98.6 F (37 C) (05/19 2123) Pulse Rate:  [67-78] 72 (05/20 0215) Resp:  [16-20] 19 (05/20 0215) BP: (145-179)/(65-93) 154/66 (05/20 0215) SpO2:  [93 %-98 %] 93 % (05/20 0215) Weight:  [81.6 kg] 81.6 kg (05/19 2040) General: Awake alert in no distress HEENT: Normocephalic atraumatic Chest:: Clear lungs, port on the right chest Cardiovascular: Regular rhythm Abdomen nondistended nontender Extremities warm well perfused Neurological exam Awake alert oriented x3 Speech is dysarthric No aphasia Cranial nerves: Pupils equal round react light, extraocular movements intact, visual fields full, right lower facial weakness evident at rest along with right nasolabial fold flattening, auditory acuity diminished bilaterally, tongue and palate midline. Motor examination with very subtle right upper extremity weakness.  Otherwise pretty symmetric. Sensation intact to light touch Coordination with mild right-sided dysmetria Gait testing deferred NIHSS 1a Level of Conscious.: 0 1b LOC Questions: 0 1c LOC Commands: 0 2 Best Gaze: 0 3 Visual: 0 4 Facial Palsy: 1 5a Motor Arm - left: 0 5b Motor Arm - Right: 1 6a Motor Leg - Left: 0 6b Motor Leg - Right: 0 7 Limb Ataxia: 1 8 Sensory: 0 9 Best Language: 0 10 Dysarthria: 1 11 Extinct. and Inatten0  TOTAL:  4    Labs I have reviewed labs in epic and the results pertinent to this consultation are:  CBC    Component Value Date/Time   WBC 6.7 03/20/2022 2106   RBC 3.78 (L) 03/20/2022 2106   HGB 12.7 03/20/2022 2106   HGB 13.3 03/19/2022 1112   HGB 10.9 (L) 10/08/2017 1042   HGB 11.8 06/16/2017 1014   HCT 37.7 03/20/2022 2106   HCT 33.6 (L) 10/08/2017 1042   HCT 37.0 06/16/2017 1014   PLT 256 03/20/2022 2106   PLT 272 03/19/2022 1112   PLT 228 10/08/2017 1042   PLT 291 06/16/2017 1014   MCV 99.7 03/20/2022 2106   MCV 103 (H) 10/08/2017 1042   MCV 101.9 (H) 06/16/2017 1014   MCH 33.6 03/20/2022 2106   MCHC 33.7 03/20/2022 2106   RDW 13.3 03/20/2022 2106   RDW 17.4 (H) 10/08/2017 1042   RDW 16.0 (H) 06/16/2017 1014   LYMPHSABS 1.3 03/20/2022 2106   LYMPHSABS 0.9 10/08/2017 1042   LYMPHSABS 1.3 06/16/2017 1014   MONOABS 0.5 03/20/2022 2106   MONOABS 0.5 06/16/2017 1014   EOSABS 0.2 03/20/2022 2106   EOSABS 0.0 10/08/2017 1042   BASOSABS 0.0 03/20/2022 2106   BASOSABS 0.0 10/08/2017 1042   BASOSABS 0.0 06/16/2017 1014    CMP     Component Value Date/Time   NA 137 03/20/2022 2106   NA 143 10/08/2017 1042   NA 141 06/16/2017 1014   K 3.3 (L) 03/20/2022 2106   K 3.4 10/08/2017 1042   K 3.8 06/16/2017 1014   CL 99 03/20/2022 2106   CL 100 10/08/2017 1042   CO2 27 03/20/2022 2106   CO2 30 10/08/2017 1042   CO2 31 (H) 06/16/2017 1014   GLUCOSE 122 (H) 03/20/2022 2106   GLUCOSE 126 (H) 10/08/2017 1042   BUN 12 03/20/2022 2106   BUN 10 10/08/2017 1042   BUN 8.9 06/16/2017 1014   CREATININE 0.78 03/20/2022 2106   CREATININE 0.88 03/19/2022 1112   CREATININE 0.9 10/08/2017 1042   CREATININE  0.8 06/16/2017 1014   CALCIUM 9.3 03/20/2022 2106   CALCIUM 9.5 10/08/2017 1042   CALCIUM 10.1 06/16/2017 1014   PROT 6.7 03/20/2022 2106   PROT 6.7 10/08/2017 1042   PROT 7.1 06/16/2017 1014   ALBUMIN 3.7 03/20/2022 2106   ALBUMIN 3.3 10/08/2017 1042   ALBUMIN 3.5 06/16/2017  1014   AST 22 03/20/2022 2106   AST 17 03/19/2022 1112   AST 21 06/16/2017 1014   ALT 16 03/20/2022 2106   ALT 15 03/19/2022 1112   ALT 20 10/08/2017 1042   ALT 20 06/16/2017 1014   ALKPHOS 65 03/20/2022 2106   ALKPHOS 68 10/08/2017 1042   ALKPHOS 64 06/16/2017 1014   BILITOT 0.4 03/20/2022 2106   BILITOT 0.5 03/19/2022 1112   BILITOT 0.37 06/16/2017 1014   GFRNONAA >60 03/20/2022 2106   GFRNONAA >60 03/19/2022 1112   GFRAA >60 03/14/2020 1148   Imaging I have reviewed the images obtained: MRI brain with area of restricted diffusion involving the caudate along with external capsule. CTA head and neck with no emergent LVO  Assessment:  81 year old with past medical history of triple negative stage IIa ductal carcinoma of the left breast status post partial mastectomy/chemo/radiation in remission for 5 years, hypertension hyperlipidemia presenting for evaluation of some confusion and weakness on the right side.  MRI brain consistent with restricted diffusion involving the caudate along with part of the external capsule and a small vessel/lacunar stroke type pattern. CTA head and neck negative for emergent LVO. Needs full stroke work-up  Recommendations: Admit to hospitalist Frequent neurochecks Telemetry 2D echo A1c Lipid panel Aspirin 81+ Plavix 75 for 3 weeks followed by aspirin only. High intensity statin PT OT Speech therapy Permissive hypertension-allow for permissive hypertension for the next 24 to 48 hours and then normalize blood pressures for goal of normotension on discharge. Stroke team to follow. Plan discussed with Dr. Nevada Crane as well as patient as well as her daughters at bedside.  -- Amie Portland, MD Neurologist [facility] Pager: [phone]

## 2022-03-21 NOTE — ED Notes (Signed)
Patient transported to MRI 

## 2022-03-21 NOTE — Evaluation (Signed)
Physical Therapy Evaluation Patient Details Name: Jessica [name] MRN: [id number] DOB: 1941-06-08 Today's Date: 03/21/2022  History of Present Illness  Jessica [name] is a 81 y.o. female who  presented to the emergency room on 03/20/2022 for evaluation of confusion and weakness. MRI revealed a left caudate/external capsule stroke. Past medical history of triple negative stage IIa ductal carcinoma of the left breast cancer s/p partial mastectomy 2018, HLD, HTN.  Clinical Impression  Pt presents to PT with deficits in strength, power, balance, gait, endurance. Pt with flat affect, slurred speech, and R facial droop at this time. Pt demonstrates increased time to perform all mobility and is at an increased risk for falls due to weakness and posterior lean. Pt will benefit from acute PT services in an effort to reduce falls risk and restore independence. PT recommends AIR admission as the pt was independent prior to admission and demonstrates the potential to return to this level.       Recommendations for follow up therapy are one component of a multi-disciplinary discharge planning process, led by the attending physician.  Recommendations may be updated based on patient status, additional functional criteria and insurance authorization.  Follow Up Recommendations Acute inpatient rehab (3hours/day)    Assistance Recommended at Discharge Frequent or constant Supervision/Assistance  Patient can return home with the following  A little help with walking and/or transfers;A little help with bathing/dressing/bathroom;Assistance with cooking/housework;Assist for transportation;Help with stairs or ramp for entrance    Equipment Recommendations Rolling walker (2 wheels);BSC/3in1  Recommendations for Other Services  Rehab consult    Functional Status Assessment Patient has had a recent decline in their functional status and demonstrates the ability to make significant improvements in function in a  reasonable and predictable amount of time.     Precautions / Restrictions Precautions Precautions: Fall Restrictions Weight Bearing Restrictions: No      Mobility  Bed Mobility Overal bed mobility: Needs Assistance Bed Mobility: Supine to Sit     Supine to sit: Min guard, HOB elevated     General bed mobility comments: increased time    Transfers Overall transfer level: Needs assistance Equipment used: None Transfers: Sit to/from Stand Sit to Stand: Min guard           General transfer comment: posterior lean against bed    Ambulation/Gait Ambulation/Gait assistance: Min assist Gait Distance (Feet): 4 Feet (4' forward and backward x 2 trials) Assistive device: None Gait Pattern/deviations: Step-through pattern, Decreased stride length Gait velocity: reduced Gait velocity interpretation: <1.31 ft/sec, indicative of household ambulator   General Gait Details: pt with short step-through gait, increased lateral sway, posterior lean and loss of balance with both trials of backward stepping.  Stairs            Wheelchair Mobility    Modified Rankin (Stroke Patients Only) Modified Rankin (Stroke Patients Only) Pre-Morbid Rankin Score: No symptoms Modified Rankin: Moderately severe disability     Balance Overall balance assessment: Needs assistance Sitting-balance support: No upper extremity supported, Feet supported Sitting balance-Leahy Scale: Poor Sitting balance - Comments: minG   Standing balance support: No upper extremity supported Standing balance-Leahy Scale: Poor Standing balance comment: minG-minA, intermittent posterior lean                             Pertinent Vitals/Pain Pain Assessment Pain Assessment: No/denies pain    Home Living Family/patient expects to be discharged to:: Private residence Living Arrangements: Alone  Available Help at Discharge: Family;Available PRN/intermittently Type of Home: House Home Access:  Stairs to enter Entrance Stairs-Rails: None Entrance Stairs-Number of Steps: 1   Home Layout: One level Home Equipment: None      Prior Function Prior Level of Function : Independent/Modified Independent;Driving             Mobility Comments: enjoys scrapbooking       Hand Dominance   Dominant Hand: Right    Extremity/Trunk Assessment   Upper Extremity Assessment Upper Extremity Assessment: Defer to OT evaluation    Lower Extremity Assessment Lower Extremity Assessment: RLE deficits/detail RLE Deficits / Details: grossly 4/5, ROM WFL RLE Coordination: decreased gross motor (slowed heel to shin)    Cervical / Trunk Assessment Cervical / Trunk Assessment: Normal  Communication   Communication:  (dysarthric)  Cognition Arousal/Alertness: Awake/alert Behavior During Therapy: Flat affect Overall Cognitive Status: Impaired/Different from baseline Area of Impairment: Problem solving                             Problem Solving: Slow processing          General Comments General comments (skin integrity, edema, etc.): VSS on RA    Exercises     Assessment/Plan    PT Assessment Patient needs continued PT services  PT Problem List Decreased strength;Decreased activity tolerance;Decreased balance;Decreased mobility;Decreased cognition;Decreased coordination;Decreased knowledge of use of DME       PT Treatment Interventions DME instruction;Gait training;Stair training;Functional mobility training;Therapeutic activities;Therapeutic exercise;Balance training;Neuromuscular re-education;Patient/family education;Cognitive remediation    PT Goals (Current goals can be found in the Care Plan section)  Acute Rehab PT Goals Patient Stated Goal: to return to independence PT Goal Formulation: With patient/family Time For Goal Achievement: 04/04/22 Potential to Achieve Goals: Good    Frequency Min 4X/week     Co-evaluation               AM-PAC PT  "6 Clicks" Mobility  Outcome Measure Help needed turning from your back to your side while in a flat bed without using bedrails?: A Little Help needed moving from lying on your back to sitting on the side of a flat bed without using bedrails?: A Little Help needed moving to and from a bed to a chair (including a wheelchair)?: A Little Help needed standing up from a chair using your arms (e.g., wheelchair or bedside chair)?: A Little Help needed to walk in hospital room?: Total Help needed climbing 3-5 steps with a railing? : Total 6 Click Score: 14    End of Session   Activity Tolerance: Patient tolerated treatment well Patient left: in bed;with call bell/phone within reach;with family/visitor present Nurse Communication: Mobility status PT Visit Diagnosis: Other abnormalities of gait and mobility (R26.89);Muscle weakness (generalized) (M62.81)    Time: 0263-7858 PT Time Calculation (min) (ACUTE ONLY): 25 min   Charges:   PT Evaluation $PT Eval Low Complexity: Golden, PT, DPT Acute Rehabilitation Pager: [phone] Office [phone]   Zenaida Niece 03/21/2022, 9:49 AM

## 2022-03-21 NOTE — Assessment & Plan Note (Signed)
-  Continue Lipitor °

## 2022-03-21 NOTE — Progress Notes (Addendum)
STROKE TEAM PROGRESS NOTE   INTERVAL HISTORY Her daughter is at the bedside.  Daughter has noticed some increased depression after his new stroke worse than baseline.  On venlafaxine per PCP.  Speech is dysarthric, some right upper extremity weakness, tremor at baseline. PT/OT/ST pending. Echo pending.   Vitals:   03/21/22 0730 03/21/22 0745 03/21/22 0800 03/21/22 0815  BP: (!) 159/70 (!) 165/72 (!) 156/74 (!) 168/97  Pulse: 72 73 69 76  Resp: '18 18 19 14  '$ Temp:      TempSrc:      SpO2: 95% 94% 93% 96%  Weight:      Height:       CBC:  Recent Labs  Lab 03/19/22 1112 03/20/22 2106 03/21/22 0419  WBC 6.3 6.7 8.0  NEUTROABS 4.3 4.6  --   HGB 13.3 12.7 12.8  HCT 39.7 37.7 38.0  MCV 100.0 99.7 99.0  PLT 272 256 660   Basic Metabolic Panel:  Recent Labs  Lab 03/20/22 2106 03/21/22 0419  NA 137 137  K 3.3* 3.3*  CL 99 99  CO2 27 29  GLUCOSE 122* 107*  BUN 12 9  CREATININE 0.78 0.66  CALCIUM 9.3 9.5  MG  --  1.9  PHOS  --  3.6   Lipid Panel:  Recent Labs  Lab 03/21/22 0419  CHOL 297*  TRIG 161*  HDL 49  CHOLHDL 6.1  VLDL 32  LDLCALC 216*   HgbA1c:  Recent Labs  Lab 03/21/22 0419  HGBA1C 5.6   Urine Drug Screen:  Recent Labs  Lab 03/20/22 2124  LABOPIA NONE DETECTED  COCAINSCRNUR NONE DETECTED  LABBENZ NONE DETECTED  AMPHETMU NONE DETECTED  THCU NONE DETECTED  LABBARB NONE DETECTED    Alcohol Level  Recent Labs  Lab 03/20/22 2106  ETH <10    IMAGING past 24 hours CT ANGIO HEAD NECK W WO CM  Result Date: 03/21/2022 CLINICAL DATA:  Stroke follow-up EXAM: CT ANGIOGRAPHY HEAD AND NECK TECHNIQUE: Multidetector CT imaging of the head and neck was performed using the standard protocol during bolus administration of intravenous contrast. Multiplanar CT image reconstructions and MIPs were obtained to evaluate the vascular anatomy. Carotid stenosis measurements (when applicable) are obtained utilizing NASCET criteria, using the distal internal carotid  diameter as the denominator. RADIATION DOSE REDUCTION: This exam was performed according to the departmental dose-optimization program which includes automated exposure control, adjustment of the mA and/or kV according to patient size and/or use of iterative reconstruction technique. CONTRAST:  80m OMNIPAQUE IOHEXOL 350 MG/ML SOLN COMPARISON:  Head CT 03/20/2022 FINDINGS: CTA NECK FINDINGS SKELETON: There is no bony spinal canal stenosis. No lytic or blastic lesion. OTHER NECK: Normal pharynx, larynx and major salivary glands. No cervical lymphadenopathy. Unremarkable thyroid gland. UPPER CHEST: No pneumothorax or pleural effusion. No nodules or masses. AORTIC ARCH: There is calcific atherosclerosis of the aortic arch. There is no aneurysm, dissection or hemodynamically significant stenosis of the visualized portion of the aorta. Conventional 3 vessel aortic branching pattern. The visualized proximal subclavian arteries are widely patent. RIGHT CAROTID SYSTEM: Normal without aneurysm, dissection or stenosis. LEFT CAROTID SYSTEM: Normal without aneurysm, dissection or stenosis. VERTEBRAL ARTERIES: Left dominant configuration. Both origins are clearly patent. There is no dissection, occlusion or flow-limiting stenosis to the skull base (V1-V3 segments). CTA HEAD FINDINGS POSTERIOR CIRCULATION: --Vertebral arteries: Normal V4 segments. --Inferior cerebellar arteries: Normal. --Basilar artery: Normal. --Superior cerebellar arteries: Normal. --Posterior cerebral arteries (PCA): Mild atherosclerotic irregularity of the right PCA ANTERIOR CIRCULATION: --  Intracranial internal carotid arteries: Normal. --Anterior cerebral arteries (ACA): Normal. Both A1 segments are present. Patent anterior communicating artery (a-comm). --Middle cerebral arteries (MCA): Normal. VENOUS SINUSES: As permitted by contrast timing, patent. ANATOMIC VARIANTS: None Review of the MIP images confirms the above findings. IMPRESSION: 1. No emergent  large vessel occlusion or high-grade stenosis of the intracranial or cervical arteries. 2. Aortic Atherosclerosis (ICD10-I70.0). Electronically Signed   By: Ulyses Jarred M.D.   On: 03/21/2022 03:48   CT Head Wo Contrast  Result Date: 03/20/2022 CLINICAL DATA:  Altered mental status and right facial droop EXAM: CT HEAD WITHOUT CONTRAST TECHNIQUE: Contiguous axial images were obtained from the base of the skull through the vertex without intravenous contrast. RADIATION DOSE REDUCTION: This exam was performed according to the departmental dose-optimization program which includes automated exposure control, adjustment of the mA and/or kV according to patient size and/or use of iterative reconstruction technique. COMPARISON:  12/31/2021 FINDINGS: Brain: Chronic atrophic changes are noted. Old lacunar infarct in the right internal capsule, right basal ganglia and adjacent to the head of the caudate nucleus is seen. No acute hemorrhage or acute infarction is noted. Vascular: No hyperdense vessel or unexpected calcification. Skull: Normal. Negative for fracture or focal lesion. Sinuses/Orbits: No acute finding. Other: None. IMPRESSION: Chronic ischemic change without acute abnormality. Electronically Signed   By: Inez Catalina M.D.   On: 03/20/2022 22:36   MR Brain W and Wo Contrast  Result Date: 03/21/2022 CLINICAL DATA:  Slurred speech EXAM: MRI HEAD WITHOUT AND WITH CONTRAST TECHNIQUE: Multiplanar, multiecho pulse sequences of the brain and surrounding structures were obtained without and with intravenous contrast. CONTRAST:  22m GADAVIST GADOBUTROL 1 MMOL/ML IV SOLN COMPARISON:  10/19/2020 FINDINGS: Brain: There is a small focus of acute ischemia within the left caudate body. Old right basal ganglia small vessel infarct. No acute or chronic hemorrhage. There is multifocal hyperintense T2-weighted signal within the white matter. Generalized cerebral volume loss. The midline structures are normal. There is no  abnormal contrast enhancement. Vascular: Major flow voids are preserved. Skull and upper cervical spine: Normal calvarium and skull base. Visualized upper cervical spine and soft tissues are normal. Sinuses/Orbits:No paranasal sinus fluid levels or advanced mucosal thickening. No mastoid or middle ear effusion. Normal orbits. IMPRESSION: 1. Small focus of acute ischemia within the left caudate body. No hemorrhage or mass effect. 2. Old right basal ganglia small vessel infarct and findings of chronic microvascular disease. Electronically Signed   By: KUlyses JarredM.D.   On: 03/21/2022 02:06    PHYSICAL EXAM  Physical Exam  Constitutional: Appears well-developed and well-nourished.  Cardiovascular: Normal rate and regular rhythm.  Respiratory: Effort normal, non-labored breathing  Neuro: Awake alert oriented x3 Speech is dysarthric No aphasia Cranial nerves: Pupils equal round react light, extraocular movements intact, visual fields full, right lower facial weakness evident at rest along with right nasolabial fold flattening, auditory acuity diminished bilaterally, tongue and palate midline. Motor examination with very subtle right upper extremity weakness.  BLE strong 5/5 Sensation intact to light touch Coordination with mild right-sided dysmetria, tremor  Gait testing deferred  ASSESSMENT/PLAN Ms. Jessica SCANTLINGis a 81y.o. female with history of triple negative stage IIa ductal carcinoma of the left breast-status post chemotherapy and radiation and partial mastectomy May 2018, in remission for the past 5 years, hyperlipidemia, hypertension, presented to the emergency room for evaluation of confusion and weakness. MRI shows a lacunar stroke in the left external capsule likely due to SVD.  DAPT therapy initiated with ASA '81mg'$  and plavix '75mg'$ .   Stroke:  Left caudate/external capsule infarct likely secondary small vessel disease source Code Stroke CT head No acute abnormality. Small vessel  disease.  CTA head & neck No LVO or high grade stenosis MRI  left caudate/external capsule stroke 2D Echo pending LDL 216 HgbA1c 5.6 VTE prophylaxis - SCDs    Diet   Diet Heart Room service appropriate? Yes; Fluid consistency: Thin   No antithrombotic prior to admission, now on aspirin 81 mg daily and clopidogrel 75 mg daily for 3 months and then ASA '81mg'$  alone Therapy recommendations:  Acute inpatient rehab Disposition:  Pending  Hypertension Home meds:  hydrochlorothiazide Stable Permissive hypertension (OK if < 220/120) but gradually normalize in 5-7 days Long-term BP goal normotensive  Hyperlipidemia LDL 216, goal < 70 Add Atorvastatin '80mg'$   Continue statin at discharge  Diabetes type II Controlled Home meds:  None HgbA1c 5.6, goal < 7.0 CBGs Recent Labs    03/20/22 2126  GLUCAP 132*    SSI  Other Stroke Risk Factors Advanced Age >/= 68  Obesity, Body mass index is 31.89 kg/m., BMI >/= 30 associated with increased stroke risk, recommend weight loss, diet and exercise as appropriate  Hx stroke/TIA No deficits, incidental finding previously CT remote infarct of the right caudate head and anterior limb of right internal capsule  Other Active Problems Depression/anxiety Home meds: venlafaxine '225mg'$  Hypothyroidism Home meds: synthroid  Hospital day # 0  Patient seen and examined by NP/APP with MD. MD to update note as needed.   Jessica Ores, DNP, FNP-BC [facility] Pager: [phone]  ATTENDING ATTESTATION:  81 year old female with left internal capsule caudate stroke on MRI.  With history of ductal cancer and hyperlipidemia.  CTA shows aortic atherosclerosis which could be a source of her CVA.  LDL is 216.  On exam she appears to have mild right upper extremity weakness with mild ataxia.  Recommend DAPT therapy for 3 months then aspirin alone due to aortic atherosclerosis and higher risk for recurrent CVA.  Discussed with her is important to  stay on her high-dose statin and ways to reduce risk by exercising and modifying her diet.  She understands this.  She is undergoing evaluation possible inpatient rehab.  We will follow-up on echo results, please notify neurology if abnormal.  neurology will sign off please call with questions.  Dr. Reeves Forth evaluated pt independently, reviewed imaging, chart, labs. Discussed and formulated plan with the APP. Please see APP note above for details.   Total 36 minutes spent on counseling patient and coordinating care, writing notes and reviewing chart.  ADDENDUM: Echo negative. Recs as above.  Jessica Maney,MD    To contact Stroke Continuity provider, please refer to [email or web address] After hours, contact General Neurology

## 2022-03-21 NOTE — ED Notes (Signed)
Notified Danford, MD about pt NIH

## 2022-03-21 NOTE — Assessment & Plan Note (Signed)
BMI 32 

## 2022-03-21 NOTE — ED Notes (Signed)
Echo at bedside

## 2022-03-21 NOTE — Care Management Obs Status (Signed)
[facility] NOTIFICATION   Patient Details  Name: Jessica [name] MRN: [id number] Date of Birth: Aug 22, 1941   Medicare Observation Status Notification Given:  Yes    Verdell Carmine, RN 03/21/2022, 10:42 AM

## 2022-03-21 NOTE — Progress Notes (Signed)
Inpatient Rehab Admissions Coordinator Note:   Per therapy patient was screened for CIR candidacy by Chaz Mcglasson Danford Bad, CCC-SLP. Note pt is under observation status at this time. Pt may not have the medical necessity to warrant an inpatient rehab stay if they remain observation. If status were to change to inpatient, [facility] will screen for candidacy.    Gayland Curry, [place], [place] Admissions Coordinator [phone] 03/21/22 12:16 PM

## 2022-03-21 NOTE — Assessment & Plan Note (Signed)
-   Permissive HTN - Hold HCTZ

## 2022-03-21 NOTE — Assessment & Plan Note (Signed)
Resolved with supplementation and starting spironolactone. 

## 2022-03-21 NOTE — Evaluation (Signed)
Occupational Therapy Evaluation Patient Details Name: Jessica [name] MRN: [id number] DOB: [birth year] Today's Date: 03/21/2022   History of Present Illness Jessica [name] is a 81 y.o. female who  presented to the emergency room on 03/20/2022 for evaluation of confusion and weakness. MRI revealed a left caudate/external capsule stroke. Past medical history of triple negative stage IIa ductal carcinoma of the left breast cancer s/p partial mastectomy 2018, HLD, HTN.   Clinical Impression   Jessica [name] was evaluated s/p the above admission list, she lives alone at baseline and is independent with driving and medication management. She has supportive family near-by. Upon evaluation pt was limited by slurred, slow speech, impaired cognition, slow and deliberate coordination with BUEs but R>L, impaired balance and difficulty ambulating. She required min A for transfers and minimal walking, and up to max A for LB ALDs. She will benefit from TO acutely. Recommend AIR at d/c to progress pt back to her indep baseline.   Recommendations for follow up therapy are one component of a multi-disciplinary discharge planning process, led by the attending physician.  Recommendations may be updated based on patient status, additional functional criteria and insurance authorization.   Follow Up Recommendations  Acute inpatient rehab (3hours/day)    Assistance Recommended at Discharge Frequent or constant Supervision/Assistance  Patient can return home with the following A lot of help with walking and/or transfers;A lot of help with bathing/dressing/bathroom;Direct supervision/assist for medications management;Direct supervision/assist for financial management;Assist for transportation;Help with stairs or ramp for entrance    Functional Status Assessment  Patient has had a recent decline in their functional status and demonstrates the ability to make significant improvements in function in a reasonable and  predictable amount of time.  Equipment Recommendations  BSC/3in1;Other (comment) (RW)    Recommendations for Other Services Rehab consult     Precautions / Restrictions Precautions Precautions: Fall Restrictions Weight Bearing Restrictions: No      Mobility Bed Mobility Overal bed mobility: Needs Assistance Bed Mobility: Supine to Sit, Sit to Supine     Supine to sit: Min assist Sit to supine: Min assist        Transfers Overall transfer level: Needs assistance Equipment used: 1 person hand held assist Transfers: Sit to/from Stand Sit to Stand: Min assist           General transfer comment: min A from high stretcher height      Balance Overall balance assessment: Needs assistance Sitting-balance support: No upper extremity supported Sitting balance-Leahy Scale: Fair     Standing balance support: Single extremity supported Standing balance-Leahy Scale: Poor                             ADL either performed or assessed with clinical judgement   ADL Overall ADL's : Needs assistance/impaired Eating/Feeding: NPO   Grooming: Set up;Min guard;Sitting   Upper Body Bathing: Minimal assistance;Sitting   Lower Body Bathing: Maximal assistance;Sit to/from stand   Upper Body Dressing : Min guard;Set up;Sitting   Lower Body Dressing: Maximal assistance;Sit to/from stand   Toilet Transfer: Minimal assistance;Ambulation Toilet Transfer Details (indicate cue type and reason): hand hold assist & small shufflingsteps Toileting- Clothing Manipulation and Hygiene: Minimal assistance;Sitting/lateral lean       Functional mobility during ADLs: Minimal assistance;Cueing for safety;Cueing for sequencing General ADL Comments: anxious, requierd increased time and cues     Vision Baseline Vision/History: 0 No visual deficits Ability to See in Adequate Light: 0  Adequate Patient Visual Report: No change from baseline Vision Assessment?: Vision impaired- to  be further tested in functional context Additional Comments: poor attntion to task, eyes skipping at midline     Perception     Praxis      Pertinent Vitals/Pain Pain Assessment Pain Assessment: No/denies pain     Hand Dominance Right   Extremity/Trunk Assessment Upper Extremity Assessment Upper Extremity Assessment: Defer to OT evaluation RUE Deficits / Details: 4/5 gross. slow and deliberate coordination RUE Sensation: WNL RUE Coordination: decreased fine motor LUE Deficits / Details: pain and reddness, possibly from BP cuff? ROM is WFL LUE Sensation: WNL   Lower Extremity Assessment Lower Extremity Assessment: RLE deficits/detail RLE Deficits / Details: grossly 4/5, ROM WFL RLE Coordination: decreased gross motor (slowed heel to shin)   Cervical / Trunk Assessment Cervical / Trunk Assessment: Normal   Communication Communication Communication:  (dysarthric)   Cognition Arousal/Alertness: Awake/alert Behavior During Therapy: Flat affect Overall Cognitive Status: Impaired/Different from baseline Area of Impairment: Following commands, Awareness, Problem solving                       Following Commands: Follows one step commands with increased time   Awareness: Emergent Problem Solving: Slow processing, Requires verbal cues       General Comments  VSS on RA    Exercises     Shoulder Instructions      Home Living Family/patient expects to be discharged to:: Private residence Living Arrangements: Alone Available Help at Discharge: Family;Available PRN/intermittently Type of Home: House Home Access: Stairs to enter [employer] of Steps: 1 Entrance Stairs-Rails: None Home Layout: One level     Bathroom Shower/Tub: Occupational psychologist: Handicapped height     Home Equipment: None          Prior Functioning/Environment Prior Level of Function : Independent/Modified Independent;Driving             Mobility  Comments: enjoys scrapbooking ADLs Comments: daughter assists with finances        OT Problem List: Decreased strength;Decreased range of motion;Decreased activity tolerance;Impaired balance (sitting and/or standing);Decreased knowledge of use of DME or AE;Decreased safety awareness;Decreased coordination;Decreased cognition      OT Treatment/Interventions: Self-care/ADL training;Therapeutic exercise;Balance training;Patient/family education;Therapeutic activities;Neuromuscular education;DME and/or AE instruction    OT Goals(Current goals can be found in the care plan section) Acute Rehab OT Goals Patient Stated Goal: did not state OT Goal Formulation: With patient Time For Goal Achievement: 04/04/22 Potential to Achieve Goals: Good ADL Goals Pt Will Perform Grooming: with modified independence;standing Pt Will Perform Lower Body Bathing: with modified independence;sit to/from stand Pt Will Perform Lower Body Dressing: with modified independence;sit to/from stand Pt Will Transfer to Toilet: with modified independence;ambulating Additional ADL Goal #1: Pt will demonstrate increased activity tolerance to complete at least 3 ADLs in standing given supervision A  OT Frequency: Min 2X/week    Co-evaluation              AM-PAC OT "6 Clicks" Daily Activity     Outcome Measure Help from another person eating meals?: Total Help from another person taking care of personal grooming?: A Little Help from another person toileting, which includes using toliet, bedpan, or urinal?: A Lot Help from another person bathing (including washing, rinsing, drying)?: A Lot Help from another person to put on and taking off regular upper body clothing?: A Little Help from another person to put on and taking off  regular lower body clothing?: A Lot 6 Click Score: 13   End of Session Equipment Utilized During Treatment: Gait belt Nurse Communication: Mobility status  Activity Tolerance:   Patient left:  in bed;with call bell/phone within reach;with family/visitor present  OT Visit Diagnosis: Unsteadiness on feet (R26.81);Other abnormalities of gait and mobility (R26.89);Muscle weakness (generalized) (M62.81);Hemiplegia and hemiparesis Hemiplegia - Right/Left: Right Hemiplegia - dominant/non-dominant: Dominant Hemiplegia - caused by: Cerebral infarction                Time: 0811-0830 OT Time Calculation (min): 19 min Charges:  OT General Charges $OT Visit: 1 Visit OT Evaluation $OT Eval Moderate Complexity: 1 Mod   Tashonna Descoteaux A Haedyn Ancrum 03/21/2022, 10:02 AM

## 2022-03-21 NOTE — Assessment & Plan Note (Signed)
Continue PPI ?

## 2022-03-21 NOTE — Progress Notes (Signed)
Per family member, pt does not use CPAP at home. Instead she has an oral device from her dentist to use in place of CPAP. Family member advised to notify for RT if they decide to try CPAP. RT will monitor as needed.

## 2022-03-21 NOTE — Assessment & Plan Note (Signed)
-   Resume Wellbutrin, mirtazapine, venlafaxine

## 2022-03-21 NOTE — ED Notes (Signed)
OT at bedside. 

## 2022-03-21 NOTE — Progress Notes (Signed)
  Progress Note   Patient: Jessica [name]:[id number] DOB: 1940/12/13 DOA: 03/20/2022     0 DOS: the patient was seen and examined on 03/21/2022 at 9:29AM      Brief hospital course: Mr. Jessica [name] is an 81 y.o. F with HTN, depression, obesity BMI 32, hypothyroidism, ACEi-angioedema, anemia, and BrCA who presented with slurred speech.    Jessica [name] found her to have slurred speech, apparent confusion and right facial droop.  In the ER, MRI brain showed a L caudate infarction.     Assessment and Plan: * Acute CVA (cerebrovascular accident) ([place]) Encephalopathy ruled out.  Symptoms are from stroke.  MR shows left BG infarction.  CTA showed normal carotids, no significant intracranial stenoses.  - Neuro checks, tele - Echocardiogram pending - Lipids ordered: LDL 216, started on high dose Lipitor - Aspirin ordered at admission  - Atrial fibrillation: none prior - tPA not given because outside window - Dysphagia screen ordered in ER - PT eval ordered    Hypokalemia - Supplement K  Personal history of malignant neoplasm of breast    Obstructive sleep apnea Noncompliant with CPAP at home - CPAP   Depression with anxiety - Resume Wellbutrin, mirtazapine, venlafaxine  Hypothyroidism TSH normal - Continue levothyroxine  GERD (gastroesophageal reflux disease) - Continue PPI  Dyslipidemia, goal LDL below 160 - Continue Lipitor  Essential hypertension - well controlled - Permissive HTN - Hold HCTZ  Obesity (BMI 30-39.9) BMI 32          Subjective: She still has right facial droop, severe weakness.  She has no confusion, fever, cough     Physical Exam: Vitals:   03/21/22 1100 03/21/22 1335 03/21/22 1345 03/21/22 1510  BP: (!) 161/80  (!) 169/72 (!) 161/70  Pulse: 67 68 67 65  Resp: $Remo'16 20 18   'vsHRL$ Temp:   98 F (36.7 C) 98.8 F (37.1 C)  TempSrc:    Oral  SpO2: 96% 96% 94% 92%  Weight:      Height:       Female, lying in bed, no acute  distress RRR, no murmurs, no peripheral edema Respiratory rate normal, lungs clear without rales or wheezes She has right facial droop, she has severe generalized weakness.  Data Reviewed: Wital signs reviewed, nursing notes reviewed LDL reviewed, MRI reviewed, A1c, CBC reviewed  Family Communication: Daughter at the bedside   Disposition: Status is: Inpatient Admitted with stroke.  She has an age assess for, severe gait disturbance, will need inpatient rehab.        Author: Edwin Dada, MD 03/21/2022 3:18 PM  For on call review [email or web address].

## 2022-03-21 NOTE — Assessment & Plan Note (Signed)
Noncompliant with CPAP at home - CPAP

## 2022-03-21 NOTE — Progress Notes (Signed)
Inpatient Rehab Admissions Coordinator Note:   Inpatient Rehab Admissions Coordinator:  Pt is no longer observation. AC screened pt for CIR candidacy. Pt appears to be an appropriate candidate for potential admission. AC will place IP rehab MD consult order.  Note, pt is requiring Min G-Min A with mobility. Pt may progress to a level that is too functional warrant a CIR admission during the time it takes to receive insurance authorization.   Gayland Curry, [place], [place] Admissions Coordinator [phone] 03/21/22 4:25 PM

## 2022-03-21 NOTE — Assessment & Plan Note (Signed)
TSH normal ?- Continue levothyroxine ?

## 2022-03-21 NOTE — Hospital Course (Signed)
Jessica [name] is an 81 y.o. F with HTN, depression, obesity BMI 32, hypothyroidism, ACEi-angioedema, anemia, and BrCA who presented with slurred speech.    [name] found her to have slurred speech, apparent confusion and right facial droop.  In the ER, MRI brain showed a L caudate infarction.

## 2022-03-21 NOTE — ED Notes (Signed)
This RN notified MD Irene Pap once MRI resulted.

## 2022-03-22 DIAGNOSIS — G9340 Encephalopathy, unspecified: Secondary | ICD-10-CM | POA: Diagnosis not present

## 2022-03-22 MED ORDER — HYDROCHLOROTHIAZIDE 12.5 MG PO TABS
12.5000 mg | ORAL_TABLET | Freq: Every day | ORAL | Status: DC
  Administered 2022-03-23: 12.5 mg via ORAL
  Filled 2022-03-22: qty 1

## 2022-03-22 NOTE — Progress Notes (Signed)
Inpatient Rehab Admissions:  Inpatient Rehab Consult received.  I met with patient and daughters [name] and Anderson Malta at the bedside for rehabilitation assessment and to discuss goals and expectations of an inpatient rehab admission.  Pt was asleep so spoke with daughters. They acknowledged understanding of CIR goals ane expectations. They are interested in pt pursuing CIR. They confirmed they will be able to provide 24/7 support for pt after discharge. Will continue to follow.  Signed: Gayland Curry, [place], [place] Admissions Coordinator [phone]

## 2022-03-22 NOTE — PMR Pre-admission (Signed)
PMR Admission Coordinator Pre-Admission Assessment  Patient: Jessica [name] is an 81 y.o., female MRN: [id number] DOB: February 01, 1941 Height: $RemoveBefo'5\' 3"'XpuqYEnIOYZ$  (160 cm) Weight: 81.6 kg  Insurance Information HMO: yes    PPO:      PCP:      IPA:      80/20:      OTHER:  PRIMARY: UHC Medicare      Policy#: [id number]      Subscriber: patient CM Name:       Phone#: [phone] Fax#: [phone] Pre-Cert#: [id number]   Received approval 03/23/22 from [place]. Pt approved for 9 days beginning 03/23/22. Updates due on 03/31/22   Employer:  Benefits:  Phone #: [email or web address]     Name:  Eff. Date: 11/02/21     Deduct: $0 (does not have deductibel)      Out of Pocket Max: $3,600 ($0 met) Life Max: NA CIR: $295/day co-pay for days 1-5, 100% coverage for days 6+      SNF: 100% coverage for days 1-20, $196 per day for days 21-39, 100% coverage for days 40-100 Outpatient: $20/visit co-pay     Co-Pay:  Home Health: 100% coverage      Co-Pay:  DME: 80% coverage     Co-Pay: 20% co-insurance Providers: in-network SECONDARY:       Policy#:      Phone#:   Development worker, community:       Phone#:   The Engineer, petroleum" for patients in Inpatient Rehabilitation Facilities with attached "Privacy Act [place] Records" was provided and verbally reviewed with: Patient  Emergency Contact Information Contact Information     Name Relation Home Work Mobile   Rew Daughter [phone]  [phone]   Candi, Profit Daughter [phone]  [phone]   Nunn,Jennifer Daughter [phone]  [phone]       Current Medical History  Patient Admitting Diagnosis: CVA History of Present Illness: Pt is an 81 year old female with medical hx significant for: HTN, depression, obesity, hypothyroidism, anemia, breast CA s/p partial mastectomy in 2018. Pt presented to [facility] on 03/20[facility]22 d/t slurred speech, weakness, and confusion.  At rest pt noted to have right-sided  facial droop which resolves with smiling. Pt not a candidate for tPA. CT head showed no acute abnormalities. MRI revealed a left caudate/external capsule stroke. CTA head and neck showed no emergent LVO. Therapy evaluations completed and CIR recommended d/t pt's deficits in functional mobility and inability to complete ADLs independently.  Complete NIHSS TOTAL: 2  Patient's medical record from [facility] has been reviewed by the rehabilitation admission coordinator and physician.  Past Medical History  Past Medical History:  Diagnosis Date   Allergy    Anemia    Anxiety    Asthma    in past, no inhalers now   Blood transfusion without reported diagnosis    Breast cancer ([place]) 01/2017   left breast    Breast cancer of upper-inner quadrant of left female breast ([place]) 03/11/2017   Cancer ([place]) 01/2017   left breast   Cataract    bilateral removed   Chronic kidney disease    kidney stones   Depression 08/31/2013   Dyslipidemia, goal LDL below 160 08/31/2013   Essential hypertension - well controlled 08/31/2013   Family history of breast cancer    Family history of melanoma    Family history of prostate cancer    GERD (gastroesophageal reflux disease)    Goals of care, counseling/discussion 03/11/2017   History of radiation  therapy 10/13/17-11/11/17   left breast 40.05 Gy in 15 fractions, left breast boost 10 Gy in 5 fractions   Hypothyroidism    Obesity (BMI 30-39.9) 08/31/2013   Personal history of chemotherapy 2018   Personal history of radiation therapy 2019   Thyroid disease    Vasovagal near-syncope -rare 08/31/2013    Has the patient had major surgery during 100 days prior to admission? No  Family History   family history includes Bone cancer in her cousin; Breast cancer in her paternal aunt; Cervical cancer in her maternal aunt; Heart attack in her brother; Heart disease in her father and mother; Lung cancer in her cousin; Prostate cancer in her brother.  Current  Medications  Current Facility-Administered Medications:    acetaminophen (TYLENOL) tablet 650 mg, 650 mg, Oral, Q6H PRN **OR** acetaminophen (TYLENOL) suppository 650 mg, 650 mg, Rectal, Q6H PRN, Marcelyn Bruins, MD   aspirin chewable tablet 81 mg, 81 mg, Oral, Daily, Irene Pap N, DO, 81 mg at 03/23/22 1022   atorvastatin (LIPITOR) tablet 80 mg, 80 mg, Oral, Daily, Irene Pap N, DO, 80 mg at 03/23/22 1022   bisacodyl (DULCOLAX) suppository 10 mg, 10 mg, Rectal, Daily PRN, Danford, Suann Larry, MD   buPROPion (WELLBUTRIN XL) 24 hr tablet 150 mg, 150 mg, Oral, q morning, Danford, Suann Larry, MD, 150 mg at 03/23/22 1022   Chlorhexidine Gluconate Cloth 2 % PADS 6 each, 6 each, Topical, Daily, Marcelyn Bruins, MD, 6 each at 03/23/22 1024   clopidogrel (PLAVIX) tablet 75 mg, 75 mg, Oral, Daily, Amie Portland, MD, 75 mg at 03/23/22 1022   enoxaparin (LOVENOX) injection 40 mg, 40 mg, Subcutaneous, Q24H, Danford, Suann Larry, MD   hydrochlorothiazide (HYDRODIURIL) tablet 12.5 mg, 12.5 mg, Oral, Daily, Danford, Suann Larry, MD, 12.5 mg at 03/23/22 1022   labetalol (NORMODYNE) injection 5 mg, 5 mg, Intravenous, Q2H PRN, Danford, Suann Larry, MD, 5 mg at 03/23/22 1057   levothyroxine (SYNTHROID) tablet 75 mcg, 75 mcg, Oral, Daily, Marcelyn Bruins, MD, 75 mcg at 03/23/22 0543   mirtazapine (REMERON) tablet 7.5 mg, 7.5 mg, Oral, QHS, Danford, Suann Larry, MD, 7.5 mg at 03/22/22 2307   pantoprazole (PROTONIX) EC tablet 40 mg, 40 mg, Oral, Daily, Marcelyn Bruins, MD, 40 mg at 03/23/22 1022   polyethylene glycol (MIRALAX / GLYCOLAX) packet 17 g, 17 g, Oral, Daily, Danford, Suann Larry, MD   senna-docusate (Senokot-S) tablet 1 tablet, 1 tablet, Oral, Daily, Danford, Suann Larry, MD   sodium chloride flush (NS) 0.9 % injection 10 mL, 10 mL, Intravenous, Once, Hall, Carole N, DO   sodium chloride flush (NS) 0.9 % injection 10-40 mL, 10-40 mL, Intracatheter, PRN, Marcelyn Bruins, MD, 10 mL at 03/21/22 1405   sodium chloride flush (NS) 0.9 % injection 3 mL, 3 mL, Intravenous, Q12H, Marcelyn Bruins, MD, 3 mL at 03/22/22 2308   venlafaxine XR (EFFEXOR-XR) 24 hr capsule 225 mg, 225 mg, Oral, Daily, Danford, Suann Larry, MD, 225 mg at 03/23/22 1022  Patients Current Diet:  Diet Order             Diet Heart Room service appropriate? Yes; Fluid consistency: Thin  Diet effective now                   Precautions / Restrictions Precautions Precautions: Fall Restrictions Weight Bearing Restrictions: No   Has the patient had 2 or more falls or a fall with injury in the past year? Yes  Prior  Activity Level Community (5-7x/wk): drives, gets out of house daily  Prior Functional Level Self Care: Did the patient need help bathing, dressing, using the toilet or eating? Independent  Indoor Mobility: Did the patient need assistance with walking from room to room (with or without device)? Independent  Stairs: Did the patient need assistance with internal or external stairs (with or without device)? Independent  Functional Cognition: Did the patient need help planning regular tasks such as shopping or remembering to take medications? Needed some help  Patient Information    Patient's Response To:     Home Assistive Devices / Equipment Home Assistive Devices/Equipment: None Home Equipment: None  Prior Device Use: Indicate devices/aids used by the patient prior to current illness, exacerbation or injury? None of the above  Current Functional Level Cognition  Overall Cognitive Status: Impaired/Different from baseline Orientation Level: Oriented X4 Following Commands: Follows one step commands with increased time General Comments: Flat affect but responds appropriately consistently, minimal verbalizations. Follows directions consistently but benefits from sequencing/initiation cues    Extremity Assessment (includes Sensation/Coordination)  Upper  Extremity Assessment: RUE deficits/detail, LUE deficits/detail RUE Deficits / Details: 4/5 gross. slow and deliberate coordination; shoulder flexion slow up to 85* AAROM. tremors worse since CVA per daughter, RUE Sensation: WNL RUE Coordination: decreased fine motor LUE Deficits / Details: hx of frozen shoulder per pt/daughter, shoulder flex to 85*. tremors worse since CVA per daughter LUE Sensation: WNL LUE Coordination: decreased fine motor  Lower Extremity Assessment: Defer to PT evaluation RLE Deficits / Details: grossly 4/5, ROM WFL RLE Coordination: decreased gross motor (slowed heel to shin)    ADLs  Overall ADL's : Needs assistance/impaired Eating/Feeding: Set up, Sitting Eating/Feeding Details (indicate cue type and reason): drinking from cup w/ straw to take meds on entry Grooming: Set up, Min guard, Sitting Upper Body Bathing: Minimal assistance, Sitting Lower Body Bathing: Maximal assistance, Sit to/from stand Upper Body Dressing : Min guard, Set up, Sitting Lower Body Dressing: Moderate assistance, Sit to/from stand, Sitting/lateral leans Lower Body Dressing Details (indicate cue type and reason): increased time/light assist to fully cross LEs to manage socks. Pt able to doff socks but requires assist to don around toes due to coordination deficits - able to manage remainder of task. Guided pt in simulated LB dressing to pull clothing up over waist using L UE (R UE supported on [place] for confidence/balance progression) Toilet Transfer: Minimal assistance, Ambulation Toilet Transfer Details (indicate cue type and reason): hand hold assist & small shufflingsteps Toileting- Clothing Manipulation and Hygiene: Minimal assistance, Sitting/lateral lean Functional mobility during ADLs: Minimal assistance, Cueing for safety, Cueing for sequencing General ADL Comments: Emphasis on breaking posterior bias (noted with PT session prior), standing balance/confidence in [place] for LB ADLs and  marching in place. Encouraged AROM of B hands (composite flexion/extension, opposition) for strength/coordination. Pt/staff with concerns of pt resting with hands in a fist, educated daughter on stretching/AROM and if ROM WFL without tightness then no splint needed at this time    Mobility  Overal bed mobility: Needs Assistance Bed Mobility: Supine to Sit Supine to sit: HOB elevated, Mod assist Sit to supine: Min assist General bed mobility comments: up in chair on entry    Transfers  Overall transfer level: Needs assistance Equipment used: 1 person hand held assist, Ambulation equipment used Transfers: Sit to/from Stand Sit to Stand: Mod assist General transfer comment: Mod A to stand from chair with handheld assist. Min A to stand in [place] for balance/posture exercises  Ambulation / Gait / Stairs / Wheelchair Mobility  Ambulation/Gait Ambulation/Gait assistance: Mod assist, Max assist Gait Distance (Feet): 5 Feet Assistive device: 1 person hand held assist Gait Pattern/deviations: Step-through pattern, Decreased stride length General Gait Details: pt with short step-through gait, increased lateral sway, severe posterior lean with pt unable to correct with multimodal cues max asssit to maintain upright, loss of balance with stepping back toward recliner, pt prematurly sitting, Gait velocity: reduced Gait velocity interpretation: <1.31 ft/sec, indicative of household ambulator    Posture / Balance Dynamic Sitting Balance Sitting balance - Comments: able to maintain sitting EOB without UE support Balance Overall balance assessment: Needs assistance Sitting-balance support: No upper extremity supported, Feet supported Sitting balance-Leahy Scale: Fair Sitting balance - Comments: able to maintain sitting EOB without UE support Standing balance support: No upper extremity supported Standing balance-Leahy Scale: Poor Standing balance comment: Reliant on at least one UE support in  standing    Special needs/care consideration    Previous Home Environment (from acute therapy documentation) Living Arrangements: Alone Available Help at Discharge: Family, Available 24 hours/day Type of Home: [place]: One level Home Access: Stairs to enter Entrance Stairs-Rails: None Entrance Stairs-Number of Steps: 1 in front, 2 in garage [employer] Shower/Tub: Multimedia programmer: Handicapped height Bathroom Accessibility: Yes How Accessible: Accessible via walker [place]: No  Discharge Living Setting Plans for Discharge Living Setting: Patient's home Type of Home at Discharge: House Discharge Home Layout: One level Discharge Home Access: Stairs to enter Entrance Stairs-Rails: None Entrance Stairs-Number of Steps: 1 in front, 2 in garage Discharge Bathroom Shower/Tub: Walk-in shower Discharge Bathroom Toilet: Handicapped height Discharge Bathroom Accessibility: Yes How Accessible: Accessible via walker Does the patient have any problems obtaining your medications?: No  Social/Family/Support Systems Anticipated Caregiver: Amparo Bristol (daughter), Claudia Pollock (daughter), and other children Anticipated Caregiver's Contact Information: [name]: [phone]; Jennifer:[phone] Caregiver Availability: 24/7 Discharge Plan Discussed with Primary Caregiver: Yes Is Caregiver In Agreement with Plan?: Yes Does Caregiver/Family have Issues with Lodging/Transportation while Pt is in Rehab?: No  Goals Patient/Family Goal for Rehab: Mod I-Supervision: PT/OT/ST Expected length of stay: 7-10 days Pt/Family Agrees to Admission and willing to participate: Yes Program Orientation Provided & Reviewed with Pt/Caregiver Including Roles  & Responsibilities: Yes  Decrease burden of Care through IP rehab admission: NA  Possible need for SNF placement upon discharge: Not anticipated  Patient Condition: I have reviewed medical records from [facility], spoken with CSW, and patient and daughter. I met with patient at the bedside for inpatient rehabilitation assessment.  Patient will benefit from ongoing PT, OT, and SLP, can actively participate in 3 hours of therapy a day 5 days of the week, and can make measurable gains during the admission.  Patient will also benefit from the coordinated team approach during an Inpatient Acute Rehabilitation admission.  The patient will receive intensive therapy as well as Rehabilitation physician, nursing, social worker, and care management interventions.  Due to safety, disease management, medication administration, and patient education the patient requires 24 hour a day rehabilitation nursing.  The patient is currently Mod-Max A with mobility and Mod A with basic ADLs.  Discharge setting and therapy post discharge at home with home health is anticipated.  Patient has agreed to participate in the Acute Inpatient Rehabilitation Program and will admit today.  Preadmission Screen Completed By:  Bethel Born, 03/23/2022 12:54 PM ______________________________________________________________________   Discussed status with Dr. Naaman Plummer on 03/23/22  at 1:20  PM and received approval for admission today.  Admission Coordinator:  Bethel Born, CCC-SLP, time 1:20 PM/Date 03/23/22    Assessment/Plan: Diagnosis: left caudate/external capsule infarct Does the need for close, 24 hr/day Medical supervision in concert with the patient's rehab needs make it unreasonable for this patient to be served in a less intensive setting? Yes Co-Morbidities requiring supervision/potential complications: HTN, depression, breast cancer Due to bladder management, bowel management, safety, skin/wound care, disease management, medication administration, pain management, and patient education, does the patient require 24 hr/day rehab nursing? Yes Does the patient require coordinated care of a physician, rehab nurse,  PT, OT, and SLP to address physical and functional deficits in the context of the above medical diagnosis(es)? Yes Addressing deficits in the following areas: balance, endurance, locomotion, strength, transferring, bowel/bladder control, bathing, dressing, feeding, grooming, toileting, cognition, speech, and psychosocial support Can the patient actively participate in an intensive therapy program of at least 3 hrs of therapy 5 days a week? Yes The potential for patient to make measurable gains while on inpatient rehab is excellent Anticipated functional outcomes upon discharge from inpatient rehab: modified independent and supervision PT, modified independent and supervision OT, modified independent and supervision SLP Estimated rehab length of stay to reach the above functional goals is: 7-10 days Anticipated discharge destination: Home 10. Overall Rehab/Functional Prognosis: excellent   MD Signature: Meredith Staggers, MD, [place] Director Rehabilitation Services 03/23/2022

## 2022-03-22 NOTE — Progress Notes (Signed)
  Progress Note   Patient: Jessica [name] DOB: 04/03/1941 DOA: 03/20/2022     1 DOS: the patient was seen and examined on 03/22/2022 at 9:29AM      Brief hospital course: 81 year old female with hypertension presented with slurred speech, hypophonia.  Found to have acute stroke.     Assessment and Plan: * Acute CVA (cerebrovascular accident) [facility]) She has been sleepy today, has some waxing and waning facial droop and paucity of speech, but no new symptoms. - Continue Lipitor,aspirin and Plavix.     Depression with anxiety - Continue Wellbutrin, mirtazapine, venlafaxine  Hypothyroidism - Continue levothyroxine   Essential hypertension - well controlled - Resume HCTZ  Obesity (BMI 30-39.9) BMI 32          Subjective: Facial droop slightly worse, fairly subdued and very sleepy today, to me she makes no complaints, although she is quite subdued and hypophonic     Physical Exam: Vitals:   03/22/22 0200 03/22/22 0526 03/22/22 0719 03/22/22 1519  BP:  (!) 140/93 (!) 165/89 (!) 145/73  Pulse: 68 94 87 79  Resp: '17 15 16 20  '$ Temp:  98.6 F (37 C) 98.5 F (36.9 C) 98 F (36.7 C)  TempSrc:  Oral Oral Oral  SpO2: 92% 95% 97% 96%  Weight:      Height:       Elderly adult female, lying in bed, sitting up to eat lunch, bradyphrenia noted, paucity of speech female, lying in bed, no acute distress RRR, no murmurs, no peripheral edema Respiratory rate normal, lungs clear without rales or wheezes She has right facial droop, she has severe generalized weakness.  Data Reviewed: Vital signs reviewed, nursing notes reviewed    Family Communication: Daughter at the bedside   Disposition: Status is: Inpatient Admitted with stroke.  She has an age assess for, severe gait disturbance, will need inpatient rehab.        Author: Edwin Dada, MD 03/22/2022 5:33 PM  For on call review [email or web address].

## 2022-03-22 NOTE — Evaluation (Signed)
Clinical/Bedside Swallow Evaluation Patient Details  Name: Jessica [name] MRN: [id number] Date of Birth: February 06, 1941  Today's Date: 03/22/2022 Time: SLP Start Time (ACUTE ONLY): 1218 SLP Stop Time (ACUTE ONLY): 6222 SLP Time Calculation (min) (ACUTE ONLY): 17 min  Past Medical History:  Past Medical History:  Diagnosis Date   Allergy    Anemia    Anxiety    Asthma    in past, no inhalers now   Blood transfusion without reported diagnosis    Breast cancer ([place]) 01/2017   left breast    Breast cancer of upper-inner quadrant of left female breast ([place]) 03/11/2017   Cancer ([place]) 01/2017   left breast   Cataract    bilateral removed   Chronic kidney disease    kidney stones   Depression 08/31/2013   Dyslipidemia, goal LDL below 160 08/31/2013   Essential hypertension - well controlled 08/31/2013   Family history of breast cancer    Family history of melanoma    Family history of prostate cancer    GERD (gastroesophageal reflux disease)    Goals of care, counseling/discussion 03/11/2017   History of radiation therapy 10/13/17-11/11/17   left breast 40.05 Gy in 15 fractions, left breast boost 10 Gy in 5 fractions   Hypothyroidism    Obesity (BMI 30-39.9) 08/31/2013   Personal history of chemotherapy 2018   Personal history of radiation therapy 2019   Thyroid disease    Vasovagal near-syncope -rare 08/31/2013   Past Surgical History:  Past Surgical History:  Procedure Laterality Date   ABDOMINAL HYSTERECTOMY     total   BREAST BIOPSY Left 02/24/2017    malignant   BREAST LUMPECTOMY Left 03/23/2017   broken fingers     CATARACT EXTRACTION Bilateral    COLONOSCOPY     EXCISIONAL HEMORRHOIDECTOMY     IR FLUORO GUIDE PORT INSERTION RIGHT  08/04/2017   IR REMOVAL TUN ACCESS W/ PORT W/O FL MOD SED  08/04/2017   IR US GUIDE VASC ACCESS RIGHT  08/04/2017   IRRIGATION AND DEBRIDEMENT ABSCESS Left 05/18/2017   Procedure: IRRIGATION AND DEBRIDEMENT LEFT AXILLARY ABSCESS;  Surgeon:  Michael Boston, MD;  Location: [facility];  Service: General;  Laterality: Left;   kidney stones lithotripsy     PORTACATH PLACEMENT Right 03/23/2017   Procedure: INSERTION PORT-A-CATH;  Surgeon: Erroll Luna, MD;  Location: [place];  Service: General;  Laterality: Right;   RADIOACTIVE SEED GUIDED PARTIAL MASTECTOMY WITH AXILLARY SENTINEL LYMPH NODE BIOPSY Left 03/23/2017   Procedure: LEFT BREAST RADIOACTIVE SEED GUIDED PARTIAL MASTECTOMY AND  SENTINEL LYMPH NODE [place];  Surgeon: Erroll Luna, MD;  Location: [place];  Service: General;  Laterality: Left;   ROTATOR CUFF REPAIR     left    TONSILLECTOMY     WISDOM TOOTH EXTRACTION     HPI:  Jessica [name] is a 81 y.o. female who  presented to the emergency room on 03/20/2022 for evaluation of confusion and weakness. MRI revealed a left caudate/external capsule stroke. 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    Assessment / Plan / Recommendation  Clinical Impression  Pt slowly but thoroughly awoke from sleeping most of the morning for swallow evaluation. Her oral-motor movements are slow with minimal weakness on right and dentition is natural and complete. Information processing was slow but responsive to therapist. Daughter reports some difficulty swallowing pills this  morning and frequent eructation. Multiple sips from straw did not elicit cough, throat clear and vocal quality remained clear. Appreciated manipulation and transit of applesauce without incident and graham cracker resulted in mildly prolonged mastication and minimal right side pocketing. She spontaneously removed with lingual sweep. Recommending continue regular texture, thin liquids, pills whole in puree and educated pt and dtr on swallow precautions. Will briefly follow up with swallow integrity. SLP Visit Diagnosis: Dysphagia,  unspecified (R13.10)    Aspiration Risk  Mild aspiration risk    Diet Recommendation Regular;Thin liquid   Liquid Administration via: Straw;Cup Medication Administration: Whole meds with puree Supervision: Staff to assist with self feeding;Full supervision/cueing for compensatory strategies Compensations: Slow rate;Small sips/bites;Lingual sweep for clearance of pocketing Postural Changes: Seated upright at 90 degrees    Other  Recommendations Oral Care Recommendations: Oral care BID    Recommendations for follow up therapy are one component of a multi-disciplinary discharge planning process, led by the attending physician.  Recommendations may be updated based on patient status, additional functional criteria and insurance authorization.  Follow up Recommendations  (TBD)      Assistance Recommended at Discharge Intermittent Supervision/Assistance  Functional Status Assessment Patient has had a recent decline in their functional status and demonstrates the ability to make significant improvements in function in a reasonable and predictable amount of time.  Frequency and Duration min 2x/week  2 weeks       Prognosis Barriers to Reach Goals: Cognitive deficits      Swallow Study   General Date of Onset: 03/20/22 HPI: Jessica [name] is a 81 y.o. female who  presented to the emergency room on 03/20/2022 for evaluation of confusion and weakness. MRI revealed a left caudate/external capsule stroke. 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 Type of Study: Bedside Swallow Evaluation Previous Swallow Assessment: no Diet Prior to this Study: Regular;Thin liquids Temperature Spikes Noted: No Respiratory Status: Room air History of Recent Intubation: No Behavior/Cognition: Alert;Cooperative;Pleasant mood Oral Cavity Assessment: Dry Oral Care Completed by SLP: No Oral Cavity  - Dentition: Adequate natural dentition Vision: Functional for self-feeding Self-Feeding Abilities: Needs set up Patient Positioning: Upright in bed Baseline Vocal Quality: Normal Volitional Cough: Strong Volitional Swallow: Able to elicit    Oral/Motor/Sensory Function Overall Oral Motor/Sensory Function: Within functional limits   Ice Chips Ice chips: Not tested   Thin Liquid Thin Liquid: Within functional limits Presentation: Straw;Cup    Nectar Thick Nectar Thick Liquid: Not tested   Honey Thick Honey Thick Liquid: Not tested   Puree Puree: Not tested   Solid     Solid: Impaired Oral Phase Functional Implications: Right lateral sulci pocketing Pharyngeal Phase Impairments:  (none)      Emerald Gehres, Orbie Pyo 03/22/2022,1:53 PM

## 2022-03-23 ENCOUNTER — Encounter (HOSPITAL_COMMUNITY): Payer: Self-pay | Admitting: Physical Medicine and Rehabilitation

## 2022-03-23 ENCOUNTER — Inpatient Hospital Stay (HOSPITAL_COMMUNITY)
Admission: RE | Admit: 2022-03-23 | Discharge: 2022-03-31 | DRG: 057 | Disposition: A | Discharge status: Home Health | Payer: Medicare Other | Source: Intra-hospital | Attending: Physical Medicine and Rehabilitation | Admitting: Physical Medicine and Rehabilitation

## 2022-03-23 ENCOUNTER — Other Ambulatory Visit: Payer: Self-pay

## 2022-03-23 DIAGNOSIS — R400 Somnolence: Secondary | ICD-10-CM | POA: Diagnosis present

## 2022-03-23 DIAGNOSIS — F419 Anxiety disorder, unspecified: Secondary | ICD-10-CM | POA: Diagnosis present

## 2022-03-23 DIAGNOSIS — Z90710 Acquired absence of both cervix and uterus: Secondary | ICD-10-CM | POA: Diagnosis not present

## 2022-03-23 DIAGNOSIS — E876 Hypokalemia: Secondary | ICD-10-CM | POA: Diagnosis present

## 2022-03-23 DIAGNOSIS — I69322 Dysarthria following cerebral infarction: Secondary | ICD-10-CM

## 2022-03-23 DIAGNOSIS — K2100 Gastro-esophageal reflux disease with esophagitis, without bleeding: Secondary | ICD-10-CM | POA: Diagnosis not present

## 2022-03-23 DIAGNOSIS — Z853 Personal history of malignant neoplasm of breast: Secondary | ICD-10-CM

## 2022-03-23 DIAGNOSIS — F32A Depression, unspecified: Secondary | ICD-10-CM

## 2022-03-23 DIAGNOSIS — Z6831 Body mass index (BMI) 31.0-31.9, adult: Secondary | ICD-10-CM

## 2022-03-23 DIAGNOSIS — Z8042 Family history of malignant neoplasm of prostate: Secondary | ICD-10-CM | POA: Diagnosis not present

## 2022-03-23 DIAGNOSIS — Z923 Personal history of irradiation: Secondary | ICD-10-CM

## 2022-03-23 DIAGNOSIS — R739 Hyperglycemia, unspecified: Secondary | ICD-10-CM | POA: Diagnosis present

## 2022-03-23 DIAGNOSIS — K219 Gastro-esophageal reflux disease without esophagitis: Secondary | ICD-10-CM | POA: Diagnosis not present

## 2022-03-23 DIAGNOSIS — I69320 Aphasia following cerebral infarction: Principal | ICD-10-CM

## 2022-03-23 DIAGNOSIS — I639 Cerebral infarction, unspecified: Secondary | ICD-10-CM | POA: Diagnosis not present

## 2022-03-23 DIAGNOSIS — I69392 Facial weakness following cerebral infarction: Secondary | ICD-10-CM

## 2022-03-23 DIAGNOSIS — Z79890 Hormone replacement therapy (postmenopausal): Secondary | ICD-10-CM | POA: Diagnosis not present

## 2022-03-23 DIAGNOSIS — K5900 Constipation, unspecified: Secondary | ICD-10-CM | POA: Diagnosis present

## 2022-03-23 DIAGNOSIS — I6381 Other cerebral infarction due to occlusion or stenosis of small artery: Secondary | ICD-10-CM

## 2022-03-23 DIAGNOSIS — Z9012 Acquired absence of left breast and nipple: Secondary | ICD-10-CM

## 2022-03-23 DIAGNOSIS — E039 Hypothyroidism, unspecified: Secondary | ICD-10-CM | POA: Diagnosis not present

## 2022-03-23 DIAGNOSIS — Z8249 Family history of ischemic heart disease and other diseases of the circulatory system: Secondary | ICD-10-CM

## 2022-03-23 DIAGNOSIS — R140 Abdominal distension (gaseous): Secondary | ICD-10-CM | POA: Diagnosis not present

## 2022-03-23 DIAGNOSIS — Z803 Family history of malignant neoplasm of breast: Secondary | ICD-10-CM

## 2022-03-23 DIAGNOSIS — E669 Obesity, unspecified: Secondary | ICD-10-CM | POA: Diagnosis present

## 2022-03-23 DIAGNOSIS — R1310 Dysphagia, unspecified: Secondary | ICD-10-CM | POA: Diagnosis not present

## 2022-03-23 DIAGNOSIS — Z801 Family history of malignant neoplasm of trachea, bronchus and lung: Secondary | ICD-10-CM | POA: Diagnosis not present

## 2022-03-23 DIAGNOSIS — I10 Essential (primary) hypertension: Secondary | ICD-10-CM

## 2022-03-23 DIAGNOSIS — K5901 Slow transit constipation: Secondary | ICD-10-CM | POA: Diagnosis not present

## 2022-03-23 DIAGNOSIS — E785 Hyperlipidemia, unspecified: Secondary | ICD-10-CM | POA: Diagnosis present

## 2022-03-23 DIAGNOSIS — Z79899 Other long term (current) drug therapy: Secondary | ICD-10-CM

## 2022-03-23 DIAGNOSIS — Z8049 Family history of malignant neoplasm of other genital organs: Secondary | ICD-10-CM | POA: Diagnosis not present

## 2022-03-23 DIAGNOSIS — R4701 Aphasia: Secondary | ICD-10-CM | POA: Diagnosis not present

## 2022-03-23 DIAGNOSIS — I69328 Other speech and language deficits following cerebral infarction: Secondary | ICD-10-CM

## 2022-03-23 DIAGNOSIS — G4733 Obstructive sleep apnea (adult) (pediatric): Secondary | ICD-10-CM | POA: Diagnosis present

## 2022-03-23 DIAGNOSIS — Z9221 Personal history of antineoplastic chemotherapy: Secondary | ICD-10-CM

## 2022-03-23 DIAGNOSIS — F418 Other specified anxiety disorders: Secondary | ICD-10-CM | POA: Diagnosis not present

## 2022-03-23 LAB — CBC
HCT: 38.8 % (ref 36.0–46.0)
Hemoglobin: 12.4 g/dL (ref 12.0–15.0)
MCH: 32.8 pg (ref 26.0–34.0)
MCHC: 32 g/dL (ref 30.0–36.0)
MCV: 102.6 fL — ABNORMAL HIGH (ref 80.0–100.0)
Platelets: 246 10*3/uL (ref 150–400)
RBC: 3.78 MIL/uL — ABNORMAL LOW (ref 3.87–5.11)
RDW: 13.9 % (ref 11.5–15.5)
WBC: 5.8 10*3/uL (ref 4.0–10.5)
nRBC: 0 % (ref 0.0–0.2)

## 2022-03-23 LAB — BASIC METABOLIC PANEL
Anion gap: 7 (ref 5–15)
BUN: 8 mg/dL (ref 8–23)
CO2: 28 mmol/L (ref 22–32)
Calcium: 9.1 mg/dL (ref 8.9–10.3)
Chloride: 107 mmol/L (ref 98–111)
Creatinine, Ser: 0.75 mg/dL (ref 0.44–1.00)
GFR, Estimated: >60 mL/min (ref >60)
Glucose, Bld: 108 mg/dL — ABNORMAL HIGH (ref 70–99)
Potassium: 3.6 mmol/L (ref 3.5–5.1)
Sodium: 142 mmol/L (ref 135–145)

## 2022-03-23 MED ORDER — BISACODYL 10 MG RE SUPP: 10.0000 mg | Freq: Every day | RECTAL | Status: DC | PRN

## 2022-03-23 MED ORDER — ACETAMINOPHEN 650 MG RE SUPP
650.0000 mg | Freq: Four times a day (QID) | RECTAL | Status: DC | PRN
Start: 2022-03-23 — End: 2022-03-31

## 2022-03-23 MED ORDER — MIRTAZAPINE 15 MG PO TABS
7.5000 mg | ORAL_TABLET | Freq: Every day | ORAL | Status: DC
  Administered 2022-03-23 – 2022-03-26 (×4): 7.5 mg via ORAL
  Filled 2022-03-23 (×4): qty 1

## 2022-03-23 MED ORDER — ATORVASTATIN CALCIUM 80 MG PO TABS
80.0000 mg | ORAL_TABLET | Freq: Every day | ORAL | Status: DC
Start: 2022-03-23 — End: 2022-03-30

## 2022-03-23 MED ORDER — SENNOSIDES-DOCUSATE SODIUM 8.6-50 MG PO TABS
1.0000 | ORAL_TABLET | Freq: Every day | ORAL | Status: DC
  Administered 2022-03-23: 1 via ORAL
  Filled 2022-03-23: qty 1

## 2022-03-23 MED ORDER — CLOPIDOGREL BISULFATE 75 MG PO TABS
75.0000 mg | ORAL_TABLET | Freq: Every day | ORAL | Status: DC
  Administered 2022-03-24 – 2022-03-31 (×8): 75 mg via ORAL
  Filled 2022-03-23 (×8): qty 1

## 2022-03-23 MED ORDER — ASPIRIN 81 MG PO CHEW: 81.0000 mg | CHEWABLE_TABLET | Freq: Every day | ORAL | 3 refills | Status: AC

## 2022-03-23 MED ORDER — LABETALOL HCL 5 MG/ML IV SOLN
5.0000 mg | INTRAVENOUS | Status: DC | PRN
  Administered 2022-03-23: 5 mg via INTRAVENOUS
  Filled 2022-03-23: qty 4

## 2022-03-23 MED ORDER — BISACODYL 10 MG RE SUPP
10.0000 mg | Freq: Every day | RECTAL | 0 refills | Status: DC | PRN
Start: 2022-03-23 — End: 2022-03-31

## 2022-03-23 MED ORDER — EZETIMIBE 10 MG PO TABS: 10.0000 mg | ORAL_TABLET | Freq: Every day | ORAL | 11 refills | Status: DC

## 2022-03-23 MED ORDER — ENOXAPARIN SODIUM 40 MG/0.4ML IJ SOSY: 40.0000 mg | PREFILLED_SYRINGE | INTRAMUSCULAR | Status: DC

## 2022-03-23 MED ORDER — ASPIRIN 81 MG PO CHEW
81.0000 mg | CHEWABLE_TABLET | Freq: Every day | ORAL | Status: DC
Start: 2022-03-24 — End: 2022-03-31
  Administered 2022-03-24 – 2022-03-31 (×8): 81 mg via ORAL
  Filled 2022-03-23 (×8): qty 1

## 2022-03-23 MED ORDER — BUPROPION HCL ER (XL) 150 MG PO TB24
150.0000 mg | ORAL_TABLET | Freq: Every morning | ORAL | Status: DC
  Administered 2022-03-24 – 2022-03-31 (×8): 150 mg via ORAL
  Filled 2022-03-23 (×8): qty 1

## 2022-03-23 MED ORDER — SENNOSIDES-DOCUSATE SODIUM 8.6-50 MG PO TABS
1.0000 | ORAL_TABLET | Freq: Every day | ORAL | Status: DC
  Administered 2022-03-24 – 2022-03-31 (×7): 1 via ORAL
  Filled 2022-03-23 (×7): qty 1

## 2022-03-23 MED ORDER — LEVOTHYROXINE SODIUM 75 MCG PO TABS
75.0000 ug | ORAL_TABLET | Freq: Every day | ORAL | Status: DC
  Administered 2022-03-24 – 2022-03-31 (×8): 75 ug via ORAL
  Filled 2022-03-23 (×8): qty 1

## 2022-03-23 MED ORDER — POLYETHYLENE GLYCOL 3350 17 G PO PACK
17.0000 g | PACK | Freq: Every day | ORAL | Status: DC
  Administered 2022-03-24 – 2022-03-31 (×6): 17 g via ORAL
  Filled 2022-03-23 (×7): qty 1

## 2022-03-23 MED ORDER — CLOPIDOGREL BISULFATE 75 MG PO TABS: 75.0000 mg | ORAL_TABLET | Freq: Every day | ORAL | Status: DC

## 2022-03-23 MED ORDER — VENLAFAXINE HCL ER 75 MG PO CP24
225.0000 mg | ORAL_CAPSULE | Freq: Every day | ORAL | Status: DC
  Administered 2022-03-24 – 2022-03-31 (×8): 225 mg via ORAL
  Filled 2022-03-23 (×8): qty 1

## 2022-03-23 MED ORDER — BISACODYL 10 MG RE SUPP
10.0000 mg | Freq: Every day | RECTAL | Status: DC | PRN
Start: 2022-03-23 — End: 2022-03-23

## 2022-03-23 MED ORDER — POLYETHYLENE GLYCOL 3350 17 G PO PACK
17.0000 g | PACK | Freq: Every day | ORAL | 0 refills | Status: DC
Start: 2022-03-24 — End: 2022-06-13

## 2022-03-23 MED ORDER — EXERCISE FOR HEART AND HEALTH BOOK
Freq: Once | Status: AC
  Filled 2022-03-23: qty 1

## 2022-03-23 MED ORDER — PANTOPRAZOLE SODIUM 40 MG PO TBEC
40.0000 mg | DELAYED_RELEASE_TABLET | Freq: Every day | ORAL | Status: DC
  Administered 2022-03-24 – 2022-03-31 (×8): 40 mg via ORAL
  Filled 2022-03-23 (×8): qty 1

## 2022-03-23 MED ORDER — POLYETHYLENE GLYCOL 3350 17 G PO PACK
17.0000 g | PACK | Freq: Every day | ORAL | Status: DC
  Administered 2022-03-23: 17 g via ORAL
  Filled 2022-03-23: qty 1

## 2022-03-23 MED ORDER — HYDROCHLOROTHIAZIDE 12.5 MG PO TABS
12.5000 mg | ORAL_TABLET | Freq: Every day | ORAL | Status: DC
Start: 2022-03-24 — End: 2022-03-31
  Administered 2022-03-24 – 2022-03-31 (×8): 12.5 mg via ORAL
  Filled 2022-03-23 (×8): qty 1

## 2022-03-23 MED ORDER — ATORVASTATIN CALCIUM 80 MG PO TABS
80.0000 mg | ORAL_TABLET | Freq: Every day | ORAL | Status: DC
  Administered 2022-03-24 – 2022-03-31 (×8): 80 mg via ORAL
  Filled 2022-03-23 (×8): qty 1

## 2022-03-23 MED ORDER — ENOXAPARIN SODIUM 40 MG/0.4ML IJ SOSY
40.0000 mg | PREFILLED_SYRINGE | INTRAMUSCULAR | Status: DC
  Administered 2022-03-23: 40 mg via SUBCUTANEOUS
  Filled 2022-03-23: qty 0.4

## 2022-03-23 MED ORDER — ENOXAPARIN SODIUM 40 MG/0.4ML IJ SOSY
40.0000 mg | PREFILLED_SYRINGE | INTRAMUSCULAR | Status: DC
  Administered 2022-03-24 – 2022-03-30 (×7): 40 mg via SUBCUTANEOUS
  Filled 2022-03-23 (×7): qty 0.4

## 2022-03-23 MED ORDER — ACETAMINOPHEN 325 MG PO TABS
650.0000 mg | ORAL_TABLET | Freq: Four times a day (QID) | ORAL | Status: DC | PRN
Start: 2022-03-23 — End: 2022-03-31
  Administered 2022-03-27 – 2022-03-30 (×3): 650 mg via ORAL
  Filled 2022-03-23 (×2): qty 2

## 2022-03-23 MED ORDER — PANTOPRAZOLE SODIUM 40 MG PO TBEC: 40.0000 mg | DELAYED_RELEASE_TABLET | Freq: Every day | ORAL | Status: DC

## 2022-03-23 NOTE — Progress Notes (Signed)
Signed                                                                                                                                                                                                                                                                                                                                                                                                                                                                                         PMR Admission Coordinator Pre-Admission Assessment   Patient: Jessica [name] is an 81 y.o., female MRN: [id number] DOB: 06-23-1941 Height: 5\' 3"  (160 cm) Weight: 81.6 kg   Insurance Information HMO: yes    PPO:      PCP:      IPA:      80/20:      OTHER:  PRIMARY: UHC Medicare      Policy#: [id number]      Subscriber: patient CM Name:       Phone#: [phone] Fax#: [phone] Pre-Cert#: [id number]   Received approval 03/23/22 from [place]. Pt approved for 9 days beginning 03/23/22. Updates due on 03/31/22   Employer:  Benefits:  Phone #: [email or web address]     Name:  Eff. Date: 11/02/21     Deduct: $0 (does not have deductibel)      Out of Pocket Max: $3,600 ($0 met) Life Max: NA CIR: $295/day co-pay for days 1-5, 100% coverage for days 6+      SNF: 100% coverage for days 1-20, $196 per day for days 21-39, 100% coverage for days 40-100 Outpatient: $20/visit co-pay     Co-Pay:  Home Health: 100% coverage      Co-Pay:  DME: 80% coverage     Co-Pay: 20% co-insurance Providers: in-network SECONDARY:       Policy#:      Phone#:    Development worker, community:       Phone#:    The Engineer, petroleum" for patients in Inpatient Rehabilitation Facilities with attached "Privacy Act [employer] Records" was provided and verbally reviewed with:  Patient   Emergency Contact Information Contact Information       Name Relation Home Work Mobile    [place] Daughter [phone]   [phone]    Nomie, Buchberger Daughter [phone]   [phone]    Nunn,Jennifer Daughter [phone]   [phone]           Current Medical History  Patient Admitting Diagnosis: CVA History of Present Illness: Pt is an 81 year old female with medical hx significant for: HTN, depression, obesity, hypothyroidism, anemia, breast CA s/p partial mastectomy in 2018. Pt [facility] to [facility]22 d/t slurred speech, weakness, and confusion.  At rest pt noted to have right-sided facial droop which resolves with smiling. Pt not a candidate for tPA. CT head showed no acute abnormalities. MRI revealed a left caudate/external capsule stroke. CTA head and neck showed no emergent LVO. Therapy evaluations completed and CIR recommended d/t pt's deficits in functional mobility and inability to complete ADLs independently.  Complete NIHSS TOTAL: 2   Patient's medical record from [facility] has been reviewed by the rehabilitation admission coordinator and physician.   Past Medical History      Past Medical History:  Diagnosis Date   Allergy     Anemia     Anxiety     Asthma      in past, no inhalers now   Blood transfusion without reported diagnosis     Breast cancer ([place]) 01/2017    left breast    Breast cancer of upper-inner quadrant of left female breast ([place]) 03/11/2017   Cancer ([place]) 01/2017    left breast   Cataract      bilateral removed   Chronic kidney disease      kidney stones   Depression 08/31/2013   Dyslipidemia, goal LDL below 160 08/31/2013   Essential hypertension - well controlled 08/31/2013   Family history of breast cancer     Family history of melanoma     Family history of prostate cancer     GERD (gastroesophageal reflux disease)     Goals of care, counseling/discussion 03/11/2017   History of  radiation therapy 10/13/17-11/11/17    left breast 40.05 Gy in 15 fractions, left breast boost 10 Gy in 5 fractions   Hypothyroidism     Obesity (BMI 30-39.9) 08/31/2013   Personal history of chemotherapy 2018   Personal history of radiation therapy 2019   Thyroid disease     Vasovagal near-syncope -rare 08/31/2013      Has the patient had major surgery during 100 days prior to admission? No   Family History   family history includes Bone cancer  in her cousin; Breast cancer in her paternal aunt; Cervical cancer in her maternal aunt; Heart attack in her brother; Heart disease in her father and mother; Lung cancer in her cousin; Prostate cancer in her brother.   Current Medications   Current Facility-Administered Medications:    acetaminophen (TYLENOL) tablet 650 mg, 650 mg, Oral, Q6H PRN **OR** acetaminophen (TYLENOL) suppository 650 mg, 650 mg, Rectal, Q6H PRN, Marcelyn Bruins, MD   aspirin chewable tablet 81 mg, 81 mg, Oral, Daily, Irene Pap N, DO, 81 mg at 03/23/22 1022   atorvastatin (LIPITOR) tablet 80 mg, 80 mg, Oral, Daily, Irene Pap N, DO, 80 mg at 03/23/22 1022   bisacodyl (DULCOLAX) suppository 10 mg, 10 mg, Rectal, Daily PRN, Danford, Suann Larry, MD   buPROPion (WELLBUTRIN XL) 24 hr tablet 150 mg, 150 mg, Oral, q morning, Danford, Suann Larry, MD, 150 mg at 03/23/22 1022   Chlorhexidine Gluconate Cloth 2 % PADS 6 each, 6 each, Topical, Daily, Marcelyn Bruins, MD, 6 each at 03/23/22 1024   clopidogrel (PLAVIX) tablet 75 mg, 75 mg, Oral, Daily, Amie Portland, MD, 75 mg at 03/23/22 1022   enoxaparin (LOVENOX) injection 40 mg, 40 mg, Subcutaneous, Q24H, Danford, Suann Larry, MD   hydrochlorothiazide (HYDRODIURIL) tablet 12.5 mg, 12.5 mg, Oral, Daily, Danford, Suann Larry, MD, 12.5 mg at 03/23/22 1022   labetalol (NORMODYNE) injection 5 mg, 5 mg, Intravenous, Q2H PRN, Danford, Suann Larry, MD, 5 mg at 03/23/22 1057   levothyroxine (SYNTHROID) tablet 75 mcg, 75  mcg, Oral, Daily, Marcelyn Bruins, MD, 75 mcg at 03/23/22 0543   mirtazapine (REMERON) tablet 7.5 mg, 7.5 mg, Oral, QHS, Danford, Suann Larry, MD, 7.5 mg at 03/22/22 2307   pantoprazole (PROTONIX) EC tablet 40 mg, 40 mg, Oral, Daily, Marcelyn Bruins, MD, 40 mg at 03/23/22 1022   polyethylene glycol (MIRALAX / GLYCOLAX) packet 17 g, 17 g, Oral, Daily, Danford, Suann Larry, MD   senna-docusate (Senokot-S) tablet 1 tablet, 1 tablet, Oral, Daily, Danford, Suann Larry, MD   sodium chloride flush (NS) 0.9 % injection 10 mL, 10 mL, Intravenous, Once, Hall, Carole N, DO   sodium chloride flush (NS) 0.9 % injection 10-40 mL, 10-40 mL, Intracatheter, PRN, Marcelyn Bruins, MD, 10 mL at 03/21/22 1405   sodium chloride flush (NS) 0.9 % injection 3 mL, 3 mL, Intravenous, Q12H, Marcelyn Bruins, MD, 3 mL at 03/22/22 2308   venlafaxine XR (EFFEXOR-XR) 24 hr capsule 225 mg, 225 mg, Oral, Daily, Danford, Suann Larry, MD, 225 mg at 03/23/22 1022   Patients Current Diet:  Diet Order                  Diet Heart Room service appropriate? Yes; Fluid consistency: Thin  Diet effective now                         Precautions / Restrictions Precautions Precautions: Fall Restrictions Weight Bearing Restrictions: No    Has the patient had 2 or more falls or a fall with injury in the past year? Yes   Prior Activity Level Community (5-7x/wk): drives, gets out of house daily   Prior Functional Level Self Care: Did the patient need help bathing, dressing, using the toilet or eating? Independent   Indoor Mobility: Did the patient need assistance with walking from room to room (with or without device)? Independent   Stairs: Did the patient need assistance with internal or external stairs (with or without  device)? Independent   Functional Cognition: Did the patient need help planning regular tasks such as shopping or remembering to take medications? Needed some help   Patient  Information Are you of Hispanic, Latino/a,or Spanish origin?: A. No, not of Hispanic, Latino/a, or Spanish origin What is your race?: A. White Do you need or want an interpreter to communicate with a doctor or health care staff?: 0. No   Patient's Response To:  Health Literacy and Transportation Is the patient able to respond to health literacy and transportation needs?: Yes Health Literacy - How often do you need to have someone help you when you read instructions, pamphlets, or other written material from your doctor or pharmacy?: Doesn't read them. Gives them to daughter In the past 12 months, has lack of transportation kept you from medical appointments or from getting medications?: No In the past 12 months, has lack of transportation kept you from meetings, work, or from getting things needed for daily living?: No    Development worker, international aid / [place] Devices/Equipment: None Home Equipment: None   Prior Device Use: Indicate devices/aids used by the patient prior to current illness, exacerbation or injury? None of the above   Current Functional Level Cognition   Overall Cognitive Status: Impaired/Different from baseline Orientation Level: Oriented X4 Following Commands: Follows one step commands with increased time General Comments: Flat affect but responds appropriately consistently, minimal verbalizations. Follows directions consistently but benefits from sequencing/initiation cues    Extremity Assessment (includes Sensation/Coordination)   Upper Extremity Assessment: RUE deficits/detail, LUE deficits/detail RUE Deficits / Details: 4/5 gross. slow and deliberate coordination; shoulder flexion slow up to 85* AAROM. tremors worse since CVA per daughter, RUE Sensation: WNL RUE Coordination: decreased fine motor LUE Deficits / Details: hx of frozen shoulder per pt/daughter, shoulder flex to 85*. tremors worse since CVA per daughter LUE Sensation: WNL LUE  Coordination: decreased fine motor  Lower Extremity Assessment: Defer to PT evaluation RLE Deficits / Details: grossly 4/5, ROM WFL RLE Coordination: decreased gross motor (slowed heel to shin)     ADLs   Overall ADL's : Needs assistance/impaired Eating/Feeding: Set up, Sitting Eating/Feeding Details (indicate cue type and reason): drinking from cup w/ straw to take meds on entry Grooming: Set up, Min guard, Sitting Upper Body Bathing: Minimal assistance, Sitting Lower Body Bathing: Maximal assistance, Sit to/from stand Upper Body Dressing : Min guard, Set up, Sitting Lower Body Dressing: Moderate assistance, Sit to/from stand, Sitting/lateral leans Lower Body Dressing Details (indicate cue type and reason): increased time/light assist to fully cross LEs to manage socks. Pt able to doff socks but requires assist to don around toes due to coordination deficits - able to manage remainder of task. Guided pt in simulated LB dressing to pull clothing up over waist using L UE (R UE supported on [place] for confidence/balance progression) Toilet Transfer: Minimal assistance, Ambulation Toilet Transfer Details (indicate cue type and reason): hand hold assist & small shufflingsteps Toileting- Clothing Manipulation and Hygiene: Minimal assistance, Sitting/lateral lean Functional mobility during ADLs: Minimal assistance, Cueing for safety, Cueing for sequencing General ADL Comments: Emphasis on breaking posterior bias (noted with PT session prior), standing balance/confidence in [place] for LB ADLs and marching in place. Encouraged AROM of B hands (composite flexion/extension, opposition) for strength/coordination. Pt/staff with concerns of pt resting with hands in a fist, educated daughter on stretching/AROM and if ROM WFL without tightness then no splint needed at this time     Mobility   Overal bed  mobility: Needs Assistance Bed Mobility: Supine to Sit Supine to sit: HOB elevated, Mod assist Sit to  supine: Min assist General bed mobility comments: up in chair on entry     Transfers   Overall transfer level: Needs assistance Equipment used: 1 person hand held assist, Ambulation equipment used Transfers: Sit to/from Stand Sit to Stand: Mod assist General transfer comment: Mod A to stand from chair with handheld assist. Min A to stand in [place] for balance/posture exercises     Ambulation / Gait / Stairs / Wheelchair Mobility   Ambulation/Gait Ambulation/Gait assistance: Mod assist, Max assist Gait Distance (Feet): 5 Feet Assistive device: 1 person hand held assist Gait Pattern/deviations: Step-through pattern, Decreased stride length General Gait Details: pt with short step-through gait, increased lateral sway, severe posterior lean with pt unable to correct with multimodal cues max asssit to maintain upright, loss of balance with stepping back toward recliner, pt prematurly sitting, Gait velocity: reduced Gait velocity interpretation: <1.31 ft/sec, indicative of household ambulator     Posture / Balance Dynamic Sitting Balance Sitting balance - Comments: able to maintain sitting EOB without UE support Balance Overall balance assessment: Needs assistance Sitting-balance support: No upper extremity supported, Feet supported Sitting balance-Leahy Scale: Fair Sitting balance - Comments: able to maintain sitting EOB without UE support Standing balance support: No upper extremity supported Standing balance-Leahy Scale: Poor Standing balance comment: Reliant on at least one UE support in standing     Special needs/care consideration      Previous Home Environment (from acute therapy documentation) Living Arrangements: Alone Available Help at Discharge: Family, Available 24 hours/day Type of Home: [place]: One level Home Access: Stairs to enter Entrance Stairs-Rails: None Entrance Stairs-Number of Steps: 1 in front, 2 in garage [employer] Shower/Tub: Clinical cytogeneticist: Handicapped height Bathroom Accessibility: Yes How Accessible: Accessible via walker [place]: No   Discharge Living Setting Plans for Discharge Living Setting: Patient's home Type of Home at Discharge: House Discharge Home Layout: One level Discharge Home Access: Stairs to enter Entrance Stairs-Rails: None Entrance Stairs-Number of Steps: 1 in front, 2 in garage Discharge Bathroom Shower/Tub: Walk-in shower Discharge Bathroom Toilet: Handicapped height Discharge Bathroom Accessibility: Yes How Accessible: Accessible via walker Does the patient have any problems obtaining your medications?: No   Social/Family/Support Systems Anticipated Caregiver: Amparo Bristol (daughter), Claudia Pollock (daughter), and other children Anticipated Caregiver's Contact Information: [name]: [phone]; Jennifer:[phone] Caregiver Availability: 24/7 Discharge Plan Discussed with Primary Caregiver: Yes Is Caregiver In Agreement with Plan?: Yes Does Caregiver/Family have Issues with Lodging/Transportation while Pt is in Rehab?: No   Goals Patient/Family Goal for Rehab: Mod I-Supervision: PT/OT/ST Expected length of stay: 7-10 days Pt/Family Agrees to Admission and willing to participate: Yes Program Orientation Provided & Reviewed with Pt/Caregiver Including Roles  & Responsibilities: Yes   Decrease burden of Care through IP rehab admission: NA   Possible need for SNF placement upon discharge: Not anticipated   Patient Condition: I have reviewed medical records from [facility], spoken with CSW, and patient and daughter. I met with patient at the bedside for inpatient rehabilitation assessment.  Patient will benefit from ongoing PT, OT, and SLP, can actively participate in 3 hours of therapy a day 5 days of the week, and can make measurable gains during the admission.  Patient will also benefit from the coordinated team approach during an Inpatient Acute  Rehabilitation admission.  The patient will receive intensive therapy as well as Rehabilitation physician, nursing,  Education officer, museum, and care management interventions.  Due to safety, disease management, medication administration, and patient education the patient requires 24 hour a day rehabilitation nursing.  The patient is currently Mod-Max A with mobility and Mod A with basic ADLs.  Discharge setting and therapy post discharge at home with home health is anticipated.  Patient has agreed to participate in the Acute Inpatient Rehabilitation Program and will admit today.   Preadmission Screen Completed By:  Bethel Born, 03/23/2022 12:54 PM ______________________________________________________________________   Discussed status with Dr. Naaman Plummer on 03/23/22  at 1:20 PM and received approval for admission today.   Admission Coordinator:  Bethel Born, CCC-SLP, time 1:20 PM/Date 03/23/22     Assessment/Plan: Diagnosis: left caudate/external capsule infarct Does the need for close, 24 hr/day Medical supervision in concert with the patient's rehab needs make it unreasonable for this patient to be served in a less intensive setting? Yes Co-Morbidities requiring supervision/potential complications: HTN, depression, breast cancer Due to bladder management, bowel management, safety, skin/wound care, disease management, medication administration, pain management, and patient education, does the patient require 24 hr/day rehab nursing? Yes Does the patient require coordinated care of a physician, rehab nurse, PT, OT, and SLP to address physical and functional deficits in the context of the above medical diagnosis(es)? Yes Addressing deficits in the following areas: balance, endurance, locomotion, strength, transferring, bowel/bladder control, bathing, dressing, feeding, grooming, toileting, cognition, speech, and psychosocial support Can the patient actively participate in an intensive  therapy program of at least 3 hrs of therapy 5 days a week? Yes The potential for patient to make measurable gains while on inpatient rehab is excellent Anticipated functional outcomes upon discharge from inpatient rehab: modified independent and supervision PT, modified independent and supervision OT, modified independent and supervision SLP Estimated rehab length of stay to reach the above functional goals is: 7-10 days Anticipated discharge destination: Home 10. Overall Rehab/Functional Prognosis: excellent     MD Signature: Meredith Staggers, MD, [place] Director Rehabilitation Services 03/23/2022

## 2022-03-23 NOTE — H&P (Signed)
Physical Medicine and Rehabilitation Admission H&P    Chief Complaint  Patient presents with   Altered Mental Status  : HPI: Jessica [name] is a 81 year old right-handed female with history of hypertension, hyperlipidemia, hypothyroidism, asthma, depression/anxiety, left breast cancer status post partial mastectomy 2018 as well as radiation therapy, obesity with BMI 31.89.  Per chart review patient lives alone.  1 level home one-step to entry.  Independent prior to admission.  Presented 03/20/2022 with right-sided weakness slurred speech and altered mental status with right facial droop of acute onset.  CT/MRI showed small focus of acute ischemia within the left caudate body.  No hemorrhage or mass effect.  Old right basal ganglia small vessel infarct and findings of chronic microvascular disease.  Patient did not receive tPA.  CT angiogram head and neck no emergent large vessel occlusion.  Echocardiogram ejection fraction of 60 to 65% no wall motion abnormalities.  Admission chemistries unremarkable except potassium 3.3, ammonia level within normal limits, urine drug screen negative.  Presently maintained on low-dose aspirin as well as Plavix 75 mg daily for CVA prophylaxis x3 months then aspirin alone.  Lovenox for DVT prophylaxis.  Therapy evaluations completed due to patient decreased functional mobility was admitted for a comprehensive rehab program.  Review of Systems  Constitutional:  Negative for chills and fever.  HENT:  Negative for hearing loss.   Eyes:  Negative for blurred vision and double vision.  Respiratory:  Negative for cough and shortness of breath.   Cardiovascular:  Negative for chest pain and palpitations.  Gastrointestinal:  Positive for constipation. Negative for heartburn, nausea and vomiting.       GERD  Genitourinary:  Negative for dysuria, flank pain and hematuria.  Musculoskeletal:  Positive for joint pain and myalgias.  Skin:  Negative for rash.   Psychiatric/Behavioral:  Positive for depression.        Anxiety  All other systems reviewed and are negative. Past Medical History:  Diagnosis Date   Allergy    Anemia    Anxiety    Asthma    in past, no inhalers now   Blood transfusion without reported diagnosis    Breast cancer ([place]) 01/2017   left breast    Breast cancer of upper-inner quadrant of left female breast ([place]) 03/11/2017   Cancer ([place]) 01/2017   left breast   Cataract    bilateral removed   Chronic kidney disease    kidney stones   Depression 08/31/2013   Dyslipidemia, goal LDL below 160 08/31/2013   Essential hypertension - well controlled 08/31/2013   Family history of breast cancer    Family history of melanoma    Family history of prostate cancer    GERD (gastroesophageal reflux disease)    Goals of care, counseling/discussion 03/11/2017   History of radiation therapy 10/13/17-11/11/17   left breast 40.05 Gy in 15 fractions, left breast boost 10 Gy in 5 fractions   Hypothyroidism    Obesity (BMI 30-39.9) 08/31/2013   Personal history of chemotherapy 2018   Personal history of radiation therapy 2019   Thyroid disease    Vasovagal near-syncope -rare 08/31/2013   Past Surgical History:  Procedure Laterality Date   ABDOMINAL HYSTERECTOMY     total   BREAST BIOPSY Left 02/24/2017    malignant   BREAST LUMPECTOMY Left 03/23/2017   broken fingers     CATARACT EXTRACTION Bilateral    COLONOSCOPY     EXCISIONAL HEMORRHOIDECTOMY     IR FLUORO  GUIDE PORT INSERTION RIGHT  08/04/2017   IR REMOVAL TUN ACCESS W/ PORT W/O FL MOD SED  08/04/2017   IR US GUIDE VASC ACCESS RIGHT  08/04/2017   IRRIGATION AND DEBRIDEMENT ABSCESS Left 05/18/2017   Procedure: IRRIGATION AND DEBRIDEMENT LEFT AXILLARY ABSCESS;  Surgeon: Michael Boston, MD;  Location: [facility];  Service: General;  Laterality: Left;   kidney stones lithotripsy     PORTACATH PLACEMENT Right 03/23/2017   Procedure: INSERTION PORT-A-CATH;  Surgeon: Erroll Luna, MD;  Location: [facility];  Service: General;  Laterality: Right;   RADIOACTIVE SEED GUIDED PARTIAL MASTECTOMY WITH AXILLARY SENTINEL LYMPH NODE BIOPSY Left 03/23/2017   Procedure: LEFT BREAST RADIOACTIVE SEED GUIDED PARTIAL MASTECTOMY AND  SENTINEL LYMPH NODE MAPPING;  Surgeon: Erroll Luna, MD;  Location: [facility];  Service: General;  Laterality: Left;   ROTATOR CUFF REPAIR     left    TONSILLECTOMY     WISDOM TOOTH EXTRACTION     Family History  Problem Relation Age of Onset   Heart disease Mother        Angina   Heart disease Father    Cervical cancer Maternal Aunt    Heart attack Brother        Died of MI in his mid 53s   Prostate cancer Brother        dx in his early 67s   Breast cancer Paternal Aunt        dx over 81   Bone cancer Cousin        maternal first cousin dx in her 93s-60s   Lung cancer Cousin        paternal first cousin   Colon cancer Neg Hx    Pancreatic cancer Neg Hx    Stomach cancer Neg Hx    Esophageal cancer Neg Hx    Rectal cancer Neg Hx    Social History:  reports that she has never smoked. She has never used smokeless tobacco. She reports that she does not drink alcohol and does not use drugs. Allergies:  Allergies  Allergen Reactions   Advil [Ibuprofen] Hives and Itching   Codeine Hives   Penicillins Hives    Has patient had a PCN reaction causing immediate rash, facial/tongue/throat swelling, SOB or lightheadedness with hypotension: yes Has patient had a PCN reaction causing severe rash involving mucus membranes or skin necrosis:  no Has patient had a PCN reaction that required hospitalization:  no Has patient had a PCN reaction occurring within the last 10 years: no If all of the above answers are "NO", then may proceed with Cephalosporin use. Has patient had a PCN reaction causing immediate rash, facial/tongue/throat swelling, SOB or lightheadedness with hypotension: yes Has patient had a PCN  reaction causing severe rash involving mucus membranes or skin necrosis:  no Has patient had a PCN reaction that required hospitalization:  no Has patient had a PCN reaction occurring within the last 10 years: no If all of the above answers are "NO", then may proceed with Cephalosporin use.    Sulfamethoxazole-Trimethoprim Hives   Band-Aid Plus Antibiotic [Bacitracin-Polymyxin B] Dermatitis   Benazepril Swelling    Possible cause of tongue swelling   Other Rash    Band-aid   Medications Prior to Admission  Medication Sig Dispense Refill   buPROPion (WELLBUTRIN XL) 150 MG 24 hr tablet Take 150 mg by mouth every morning.     cholecalciferol (VITAMIN D3) 25 MCG (1000 UNIT) tablet Take 1,000  Units by mouth daily.     hydrochlorothiazide (MICROZIDE) 12.5 MG capsule Take 12.5 mg by mouth daily.  1   levothyroxine (SYNTHROID, LEVOTHROID) 75 MCG tablet Take 75 mcg by mouth daily.     mirtazapine (REMERON) 7.5 MG tablet Take 7.5 mg by mouth at bedtime.     Multiple Vitamin (MULTIVITAMIN) tablet Take 1 tablet by mouth daily.     Multiple Vitamins-Minerals (OCUVITE ADULT FORMULA PO) Take by mouth daily.     omeprazole (PRILOSEC) 20 MG capsule Take 1 capsule by mouth 2 (two) times daily.     venlafaxine XR (EFFEXOR-XR) 75 MG 24 hr capsule Take 225 mg by mouth daily.     lactulose (CHRONULAC) 10 GM/15ML solution TAKE 30 ML BY MOUTH EVERY 4 HOURS AS NEEDED FOR MILD CONSTIPATION UNTIL BOWEL MOVEMENT (Patient not taking: Reported on 03/20/2022)     lactulose, encephalopathy, (GENERLAC) 10 GM/15ML SOLN TAKE 30 ML BY MOUTH EVERY 4 HOURS AS NEEDED FOR MILD CONSTIPATION UNTIL BOWEL MOVEMENT (Patient not taking: Reported on 03/20/2022) 21600 mL 3   lidocaine-prilocaine (EMLA) cream Apply 1 application topically as needed. (Patient not taking: Reported on 03/20/2022) 30 g 6   Lysine 1000 MG TABS Take 1,000 mg by mouth as needed. (Patient not taking: Reported on 03/14/2021)     omeprazole (PRILOSEC) 20 MG capsule 2  (two) times daily before a meal. (Patient not taking: Reported on 03/20/2022)     potassium chloride SA (KLOR-CON) 20 MEQ tablet Take 1 tablet (20 mEq total) by mouth daily. (Patient not taking: Reported on 03/20/2022) 14 tablet 0      Home: [place] expects to be discharged to:: Private residence Living Arrangements: Alone Available Help at Discharge: Family, Available 24 hours/day Type of Home: House Home Access: Stairs to enter [employer] of Steps: 1 in front, 2 in garage Entrance Stairs-Rails: None Home Layout: One level Bathroom Shower/Tub: Multimedia programmer: Handicapped height Bathroom Accessibility: Yes Home Equipment: None   Functional History: Prior Function Prior Level of Function : Independent/Modified Independent, Driving Mobility Comments: enjoys scrapbooking ADLs Comments: daughter assists with finances  Functional Status:  Mobility: Bed Mobility Overal bed mobility: Needs Assistance Bed Mobility: Supine to Sit Supine to sit: HOB elevated, Mod assist Sit to supine: Min assist General bed mobility comments: up in chair on entry Transfers Overall transfer level: Needs assistance Equipment used: 1 person hand held assist, Ambulation equipment used Transfers: Sit to/from Stand Sit to Stand: Mod assist General transfer comment: Mod A to stand from chair with handheld assist. Min A to stand in [place] for balance/posture exercises Ambulation/Gait Ambulation/Gait assistance: Mod assist, Max assist Gait Distance (Feet): 5 Feet Assistive device: 1 person hand held assist Gait Pattern/deviations: Step-through pattern, Decreased stride length General Gait Details: pt with short step-through gait, increased lateral sway, severe posterior lean with pt unable to correct with multimodal cues max asssit to maintain upright, loss of balance with stepping back toward recliner, pt prematurly sitting, Gait velocity: reduced Gait  velocity interpretation: <1.31 ft/sec, indicative of household ambulator    ADL: ADL Overall ADL's : Needs assistance/impaired Eating/Feeding: Set up, Sitting Eating/Feeding Details (indicate cue type and reason): drinking from cup w/ straw to take meds on entry Grooming: Set up, Min guard, Sitting Upper Body Bathing: Minimal assistance, Sitting Lower Body Bathing: Maximal assistance, Sit to/from stand Upper Body Dressing : Min guard, Set up, Sitting Lower Body Dressing: Moderate assistance, Sit to/from stand, Sitting/lateral leans Lower Body Dressing Details (indicate cue  type and reason): increased time/light assist to fully cross LEs to manage socks. Pt able to doff socks but requires assist to don around toes due to coordination deficits - able to manage remainder of task. Guided pt in simulated LB dressing to pull clothing up over waist using L UE (R UE supported on [place] for confidence/balance progression) Toilet Transfer: Minimal assistance, Ambulation Toilet Transfer Details (indicate cue type and reason): hand hold assist & small shufflingsteps Toileting- Clothing Manipulation and Hygiene: Minimal assistance, Sitting/lateral lean Functional mobility during ADLs: Minimal assistance, Cueing for safety, Cueing for sequencing General ADL Comments: Emphasis on breaking posterior bias (noted with PT session prior), standing balance/confidence in [place] for LB ADLs and marching in place. Encouraged AROM of B hands (composite flexion/extension, opposition) for strength/coordination. Pt/staff with concerns of pt resting with hands in a fist, educated daughter on stretching/AROM and if ROM WFL without tightness then no splint needed at this time  Cognition: Cognition Overall Cognitive Status: Impaired/Different from baseline Orientation Level: Oriented X4 Cognition Arousal/Alertness: Awake/alert Behavior During Therapy: Flat affect Overall Cognitive Status: Impaired/Different from  baseline Area of Impairment: Problem solving Following Commands: Follows one step commands with increased time Awareness: Emergent Problem Solving: Slow processing, Decreased initiation, Requires verbal cues General Comments: Flat affect but responds appropriately consistently, minimal verbalizations. Follows directions consistently but benefits from sequencing/initiation cues  Physical Exam: Blood pressure (!) 175/74, pulse 62, temperature 98.3 F (36.8 C), temperature source Oral, resp. rate 17, height '5\' 3"'$  (1.6 m), weight 81.6 kg, SpO2 93 %. Physical Exam Constitutional:      General: She is not in acute distress.    Appearance: She is obese.  HENT:     Head: Normocephalic and atraumatic.     Right Ear: External ear normal.     Left Ear: External ear normal.     Nose: Nose normal.     Mouth/Throat:     Mouth: Mucous membranes are moist.  Eyes:     Extraocular Movements: Extraocular movements intact.     Conjunctiva/sclera: Conjunctivae normal.     Pupils: Pupils are equal, round, and reactive to light.  Cardiovascular:     Rate and Rhythm: Normal rate and regular rhythm.     Heart sounds: No murmur heard.   No gallop.  Pulmonary:     Effort: Pulmonary effort is normal. No respiratory distress.     Breath sounds: No wheezing.  Abdominal:     General: Bowel sounds are normal. There is no distension.     Palpations: Abdomen is soft.     Tenderness: There is no abdominal tenderness.  Musculoskeletal:        General: No swelling or tenderness. Normal range of motion.     Cervical back: Normal range of motion.  Skin:    General: Skin is warm and dry.     Comments: Left mastectomy scar  Neurological:     Comments: Patient is alert.  Makes eye contact with examiner.  Speech is mildly dysarthric but intelligible.  Provides name and age.  Follows simple commands. Provides biographical information/address. Right central 7. RUE 4/5 with decreased [place]. LUE 4+ to 5/5. RLE 2+/5 prox  to 4/5 distally, LLE 3/5 prox to 4+/5 distally. Senses pain and light touch in all 4's. DTR's 1+. No abnl resting tone.   Psychiatric:     Comments: Flat, cooperative    Results for orders placed or performed during the hospital encounter of 03/20/22 (from the past 48 hour(s))  Basic  metabolic panel     Status: Abnormal   Collection Time: 03/23/22  3:57 AM  Result Value Ref Range   Sodium 142 135 - 145 mmol/L   Potassium 3.6 3.5 - 5.1 mmol/L   Chloride 107 98 - 111 mmol/L   CO2 28 22 - 32 mmol/L   Glucose, Bld 108 (H) 70 - 99 mg/dL    Comment: Glucose reference range applies only to samples taken after fasting for at least 8 hours.   BUN 8 8 - 23 mg/dL   Creatinine, Ser 0.75 0.44 - 1.00 mg/dL   Calcium 9.1 8.9 - 10.3 mg/dL   GFR, Estimated >60 >60 mL/min    Comment: (NOTE) Calculated using the CKD-EPI Creatinine Equation (2021)    Anion gap 7 5 - 15    Comment: Performed at [facility] [address], [place], [place] [address]  CBC     Status: Abnormal   Collection Time: 03/23/22  3:57 AM  Result Value Ref Range   WBC 5.8 4.0 - 10.5 K/uL   RBC 3.78 (L) 3.87 - 5.11 MIL/uL   Hemoglobin 12.4 12.0 - 15.0 g/dL   HCT 38.8 36.0 - 46.0 %   MCV 102.6 (H) 80.0 - 100.0 fL   MCH 32.8 26.0 - 34.0 pg   MCHC 32.0 30.0 - 36.0 g/dL   RDW 13.9 11.5 - 15.5 %   Platelets 246 150 - 400 K/uL   nRBC 0.0 0.0 - 0.2 %    Comment: Performed at [facility], [place] [address], [place],  [address]   ECHOCARDIOGRAM COMPLETE BUBBLE STUDY  Result Date: 03/21/2022    ECHOCARDIOGRAM REPORT   Patient Name:   LUDDIE BOGHOSIAN Date of Exam: 03/21/2022 Medical Rec #:  [id number]         Height:       63.0 in Accession #:    [id number]        Weight:       180.0 lb Date of Birth:  [birth year]        BSA:          1.849 m Patient Age:    84 years          BP:           169/72 mmHg Patient Gender: F                 HR:           69 bpm. Exam Location:  Inpatient Procedure: 2D Echo, Cardiac Doppler,  Color Doppler and Saline Contrast Bubble            Study Indications:    Stroke 434.91 / I63.9  History:        Patient has prior history of Echocardiogram examinations, most                 recent 04/21/2012. Risk Factors:Hypertension, Diabetes and                 Dyslipidemia. Chronic kidney disease. Cancer. Thyroid disease.                 GERD.  Sonographer:    Darlina Sicilian RDCS Referring Phys: [id number] [facility]  Sonographer Comments: Suboptimal parasternal window and no apical window. IMPRESSIONS  1. Left ventricular ejection fraction, by estimation, is 60 to 65%. The left ventricle has normal function. The left ventricle has no regional wall motion abnormalities.  2. Right ventricular  systolic function is normal. The right ventricular size is normal.  3. Agtated saline injected There was no large R to L shunt noted but with acoustic windows being so poor, cannot exclude tiny shunt.  4. The mitral valve is normal in structure. Trivial mitral valve regurgitation.  5. The aortic valve is tricuspid. Aortic valve regurgitation is not visualized. Aortic valve sclerosis is present, with no evidence of aortic valve stenosis. FINDINGS  Left Ventricle: Left ventricular ejection fraction, by estimation, is 60 to 65%. The left ventricle has normal function. The left ventricle has no regional wall motion abnormalities. The left ventricular internal cavity size was normal in size. There is  no left ventricular hypertrophy. Right Ventricle: The right ventricular size is normal. Right vetricular wall thickness was not assessed. Right ventricular systolic function is normal. Left Atrium: Left atrial size was normal in size. Right Atrium: Right atrial size was normal in size. Pericardium: Trivial pericardial effusion is present. Mitral Valve: The mitral valve is normal in structure. Mild mitral annular calcification. Trivial mitral valve regurgitation. Tricuspid Valve: The tricuspid valve is normal in structure. Tricuspid  valve regurgitation is trivial. Aortic Valve: The aortic valve is tricuspid. Aortic valve regurgitation is not visualized. Aortic valve sclerosis is present, with no evidence of aortic valve stenosis. Pulmonic Valve: The pulmonic valve was not well visualized. Pulmonic valve regurgitation is not visualized. Aorta: The aortic root is normal in size and structure. IAS/Shunts: Agitated saline contrast was given intravenously to evaluate for intracardiac shunting.  LEFT VENTRICLE PLAX 2D LVIDd:         5.10 cm   Diastology LVIDs:         3.10 cm   LV e' medial:    5.00 cm/s LV PW:         0.90 cm   LV E/e' medial:  9.2 LV IVS:        0.80 cm   LV e' lateral:   4.05 cm/s LVOT diam:     1.90 cm   LV E/e' lateral: 11.4 LV SV:         37 LV SV Index:   20 LVOT Area:     2.84 cm  RIGHT VENTRICLE RV S prime:     17.00 cm/s TAPSE (M-mode): 1.8 cm LEFT ATRIUM         Index       RIGHT ATRIUM           Index LA diam:    3.90 cm 2.11 cm/m  RA Area:     13.60 cm                                 RA Volume:   30.40 ml  16.44 ml/m  AORTIC VALVE LVOT Vmax:   72.70 cm/s LVOT Vmean:  50.800 cm/s LVOT VTI:    0.130 m  AORTA Ao Root diam: 3.30 cm MITRAL VALVE MV Area (PHT): 2.62 cm    SHUNTS MV Decel Time: 289 msec    Systemic VTI:  0.13 m MV E velocity: 46.10 cm/s  Systemic Diam: 1.90 cm MV A velocity: 64.50 cm/s MV E/A ratio:  0.71 Dorris Carnes MD Electronically signed by Dorris Carnes MD Signature Date/Time: 03/21/2022/3:04:16 PM    Final       Blood pressure (!) 175/74, pulse 62, temperature 98.3 F (36.8 C), temperature source Oral, resp. rate 17, height '5\' 3"'$  (1.6 m), weight 81.6  kg, SpO2 93 %.  Medical Problem List and Plan: 1. Functional deficits secondary to acute ischemic infarct left caudate body.  -patient may shower  -ELOS/Goals: 7-10 days, mod I goals with PT, OT, SLP 2.  Antithrombotics: -DVT/anticoagulation:  Pharmaceutical: Lovenox  -antiplatelet therapy: Aspirin 81 mg daily and Plavix 75 mg day x3 months then  aspirin alone as noted by neurology services Dr.Palikh 3. Pain Management: Tylenol as needed 4. Mood: Wellbutrin 150 mg daily, Effexor 225 mg daily, Remeron 7.5 mg nightly  -antipsychotic agents: N/A  -team to provide ego support as needed 5. Neuropsych: This patient is capable of making decisions on her own behalf. 6. Skin/Wound Care: Routine skin checks 7. Fluids/Electrolytes/Nutrition: Routine in and outs with follow-up chemistries 8.  Hypertension.  Hydrochlorothiazide 12.5 mg daily.  Monitor with increased mobility  -5/22 bp borderline, monitor with increasing activity 9.  Hypothyroidism.  Synthroid 10.  Hyperlipidemia.  Lipitor 11.  Constipation.  MiraLAX daily, Senokot-S daily 12.  GERD.  Protonix 13.  History of left breast cancer.  Status postmastectomy/radiation therapy 2018  Cathlyn Parsons, PA-C 03/23/2022

## 2022-03-23 NOTE — Discharge Instructions (Addendum)
Inpatient Rehab Discharge Instructions  Jessica [name] Discharge date and time: No discharge date for patient encounter.   Activities/Precautions/ Functional Status: Activity: As tolerated Diet: Regular Wound Care: Routine skin checks Functional status:  ___ No restrictions     ___ Walk up steps independently ___ 24/7 supervision/assistance   ___ Walk up steps with assistance ___ Intermittent supervision/assistance  ___ Bathe/dress independently ___ Walk with walker     __x_ Bathe/dress with assistance ___ Walk Independently    ___ Shower independently ___ Walk with assistance    ___ Shower with assistance ___ No alcohol     ___ Return to work/school ________  Special Instructions: No driving smoking or alcohol  Continue aspirin and Plavix until June 24, 2022 then aspirin alone  COMMUNITY REFERRALS UPON DISCHARGE:    Home Health:   PT  OT  SP                 Agency: [facility]    Phone: [phone]    Medical Equipment/Items Ordered:HOSPITAL BED, TUB BENCH AND 3 IN 1                                                 Agency/Supplier:[facility]  [phone]  PRIVATE DUTY AGENCY LIST GIVEN TO DAUGHTER-KAREN TO PURSUE IF NEEDED   My questions have been answered and I understand these instructions. I will adhere to these goals and the provided educational materials after my discharge from the hospital.  Patient/Caregiver Signature _______________________________ Date __________  Clinician Signature _______________________________________ Date __________  Please bring this form and your medication list with you to all your follow-up doctor's appointments.  STROKE/TIA DISCHARGE INSTRUCTIONS SMOKING Cigarette smoking nearly doubles your risk of having a stroke & is the single most alterable risk factor  If you smoke or have smoked in the last 12 months, you are advised to quit smoking for your health. Most of the excess cardiovascular risk related to smoking  disappears within a year of stopping. Ask you doctor about anti-smoking medications [facility] Quit Line: 1-800-QUIT NOW Free Smoking Cessation Classes [phone]  CHOLESTEROL Know your levels; limit fat & cholesterol in your diet  Lipid Panel     Component Value Date/Time   CHOL 297 (H) 03/21/2022 0419   TRIG 161 (H) 03/21/2022 0419   HDL 49 03/21/2022 0419   CHOLHDL 6.1 03/21/2022 0419   VLDL 32 03/21/2022 0419   LDLCALC 216 (H) 03/21/2022 0419     Many patients benefit from treatment even if their cholesterol is at goal. Goal: Total Cholesterol (CHOL) less than 160 Goal:  Triglycerides (TRIG) less than 150 Goal:  HDL greater than 40 Goal:  LDL (LDLCALC) less than 100   BLOOD PRESSURE American Stroke Association blood pressure target is less that 120/80 mm/Hg  Your discharge blood pressure is:  BP: (!) 178/85 Monitor your blood pressure Limit your salt and alcohol intake Many individuals will require more than one medication for high blood pressure  DIABETES (A1c is a blood sugar average for last 3 months) Goal HGBA1c is under 7% (HBGA1c is blood sugar average for last 3 months)  Diabetes: No known diagnosis of diabetes    Lab Results  Component Value Date   HGBA1C 5.6 03/21/2022    Your HGBA1c can be lowered with medications, healthy diet, and exercise. Check your blood sugar  as directed by your physician Call your physician if you experience unexplained or low blood sugars.  PHYSICAL ACTIVITY/REHABILITATION Goal is 30 minutes at least 4 days per week  Activity: Increase activity slowly, Therapies: Physical Therapy: Home Health Return to work:  Activity decreases your risk of heart attack and stroke and makes your heart stronger.  It helps control your weight and blood pressure; helps you relax and can improve your mood. Participate in a regular exercise program. Talk with your doctor about the best form of exercise for you (dancing, walking, swimming, cycling).  DIET/WEIGHT  Goal is to maintain a healthy weight  Your discharge diet is:  Diet Order             Diet Heart Room service appropriate? Yes; Fluid consistency: Thin  Diet effective now                   liquids Your height is:  Height: '5\' 3"'$  (160 cm) Your current weight is: Weight: 81.5 kg Your Body Mass Index (BMI) is:  BMI (Calculated): 31.84 Following the type of diet specifically designed for you will help prevent another stroke. Your goal weight range is:   Your goal Body Mass Index (BMI) is 19-24. Healthy food habits can help reduce 3 risk factors for stroke:  High cholesterol, hypertension, and excess weight.  RESOURCES Stroke/Support Group:  Call [phone]   STROKE EDUCATION PROVIDED/REVIEWED AND GIVEN TO PATIENT Stroke warning signs and symptoms How to activate emergency medical system (call 911). Medications prescribed at discharge. Need for follow-up after discharge. Personal risk factors for stroke. Pneumonia vaccine given: No Flu vaccine given: No My questions have been answered, the writing is legible, and I understand these instructions.  I will adhere to these goals & educational materials that have been provided to me after my discharge from the hospital.

## 2022-03-23 NOTE — Discharge Summary (Addendum)
Physician Discharge Summary   Patient: Jessica [name] MRN: [id number] DOB: [birth year]  Admit date:     03/20/2022  Discharge date: 03/23/22  Discharge Physician: Edwin Dada   PCP: [name], MD     Recommendations at discharge:  Follow up with [facility] in 6-8 weeks for new stroke Follow up tolerance to Atorvastatin and rotate statin or trial PCSK9 if able 3 months DAPT then aspirin alone indefinitely Monitor BP at rehab and add amlodipine if needed Follow up with PCP Dr. Shelia Media 1 week after discharge from rehab      Discharge Diagnoses: Principal Problem:   Acute CVA (cerebrovascular accident) [facility]) Active Problems:   Obesity (BMI 30-39.9)   Essential hypertension - well controlled   Dyslipidemia, goal LDL below 160   GERD (gastroesophageal reflux disease)   Hypothyroidism   Depression with anxiety   Obstructive sleep apnea   Personal history of malignant neoplasm of breast   Hypokalemia   Diabetes ruled out   Chronic kidney disease ruled out       Hospital Course: Mr. Aspinwall is an 81 y.o. F with HTN, depression, obesity BMI 32, hypothyroidism, ACEi-angioedema, anemia, and BrCA who presented with slurred speech.    [name] found her to have slurred speech, apparent confusion and right facial droop.  In the ER, MRI brain showed a L caudate infarction.     * Acute CVA (cerebrovascular accident) ([place]) Encephalopathy ruled out.  Symptoms were from stroke.  MR shows left basal ganglia infarction, mechanism unclear but suspect aortic atherosclerotic embolism.  Echo without cardiogenic source.  Telemetry without Afib.  CTA showed normal carotids, no significant intracranial stenoses.    Neurology recommended 3 months DAPT then aspirin alone.  Has follow up with [facility] arranged.  LDL 216.  Patient hesitant for statin given prior hip pain, but will trial.  Zetia added.   tPA not given because outside window, dysphagia screen  ordered in ER, PT recommended acute inpatient rehab, nonsmoker.     Obstructive sleep apnea Typically does not use CPAP at home, declined here.  Essential hypertension - well controlled Normalize BP over next 4-6 days.  May need to add amlodipine.               The [facility] Controlled Substances Registry was reviewed for this patient prior to discharge.   Consultants: Neurology   Disposition: Inaptient rehab Diet recommendation: Cardiac   DISCHARGE MEDICATION: Allergies as of 03/23/2022       Reactions   Advil [ibuprofen] Hives, Itching   Codeine Hives   Penicillins Hives   Has patient had a PCN reaction causing immediate rash, facial/tongue/throat swelling, SOB or lightheadedness with hypotension: yes Has patient had a PCN reaction causing severe rash involving mucus membranes or skin necrosis:  no Has patient had a PCN reaction that required hospitalization:  no Has patient had a PCN reaction occurring within the last 10 years: no If all of the above answers are "NO", then may proceed with Cephalosporin use. Has patient had a PCN reaction causing immediate rash, facial/tongue/throat swelling, SOB or lightheadedness with hypotension: yes Has patient had a PCN reaction causing severe rash involving mucus membranes or skin necrosis:  no Has patient had a PCN reaction that required hospitalization:  no Has patient had a PCN reaction occurring within the last 10 years: no If all of the above answers are "NO", then may proceed with Cephalosporin use.   Sulfamethoxazole-trimethoprim Hives   Band-aid Plus Antibiotic [  bacitracin-polymyxin B] Dermatitis   Benazepril Swelling   Possible cause of tongue swelling   Other Rash   Band-aid        Medication List     STOP taking these medications    lactulose (encephalopathy) 10 GM/15ML Soln Commonly known as: Generlac   lactulose 10 GM/15ML solution Commonly known as: CHRONULAC   lidocaine-prilocaine  cream Commonly known as: EMLA   Lysine 1000 MG Tabs   omeprazole 20 MG capsule Commonly known as: PRILOSEC Replaced by: pantoprazole 40 MG tablet   potassium chloride SA 20 MEQ tablet Commonly known as: KLOR-CON M       TAKE these medications    aspirin 81 MG chewable tablet Chew 1 tablet (81 mg total) by mouth daily. Start taking on: Mar 24, 2022   atorvastatin 80 MG tablet Commonly known as: LIPITOR Take 1 tablet (80 mg total) by mouth at bedtime.   bisacodyl 10 MG suppository Commonly known as: DULCOLAX Place 1 suppository (10 mg total) rectally daily as needed for severe constipation.   buPROPion 150 MG 24 hr tablet Commonly known as: WELLBUTRIN XL Take 150 mg by mouth every morning.   cholecalciferol 25 MCG (1000 UNIT) tablet Commonly known as: VITAMIN D3 Take 1,000 Units by mouth daily.   clopidogrel 75 MG tablet Commonly known as: PLAVIX Take 1 tablet (75 mg total) by mouth daily. Start taking on: Mar 24, 2022   ezetimibe 10 MG tablet Commonly known as: Zetia Take 1 tablet (10 mg total) by mouth daily.   hydrochlorothiazide 12.5 MG capsule Commonly known as: MICROZIDE Take 12.5 mg by mouth daily.   levothyroxine 75 MCG tablet Commonly known as: SYNTHROID Take 75 mcg by mouth daily.   mirtazapine 7.5 MG tablet Commonly known as: REMERON Take 7.5 mg by mouth at bedtime.   multivitamin tablet Take 1 tablet by mouth daily.   OCUVITE ADULT FORMULA PO Take by mouth daily.   pantoprazole 40 MG tablet Commonly known as: PROTONIX Take 1 tablet (40 mg total) by mouth daily. Start taking on: Mar 24, 2022 Replaces: omeprazole 20 MG capsule   polyethylene glycol 17 g packet Commonly known as: MIRALAX / GLYCOLAX Take 17 g by mouth daily. Start taking on: Mar 24, 2022   venlafaxine XR 75 MG 24 hr capsule Commonly known as: EFFEXOR-XR Take 225 mg by mouth daily.        Follow-up Information     [name], MD Follow up.   Specialty:  Internal Medicine Why: Go see within 1 week of discharge from rehab Contact information: [address] [place] [address] [phone]         [facility] Follow up.   Specialty: Neurology Why: They will call you for an appointment Contact information: [address] [place] [phone]                Discharge Instructions     Ambulatory referral to Neurology   Complete by: As directed    An appointment is requested in approximately: 8 weeks For stroke follow up       Discharge Exam: Filed Weights   03/20/22 2040  Weight: 81.6 kg    General: Pt is alert, awake, not in acute distress, sitting up in chair, alert and attentive to question Cardiovascular: RRR, nl S1-S2, no murmurs appreciated.   No LE edema.   Respiratory: Normal respiratory rate and rhythm.  CTAB without rales or wheezes. Neuro/Psych: Strength  symmetric in upper and lower extremities, right facial droop noted, paucity of spech, bradycphrenia noted.  Judgment and insight appear normal.   Condition at discharge: good  The results of significant diagnostics from this hospitalization (including imaging, microbiology, ancillary and laboratory) are listed below for reference.   Imaging Studies: CT ANGIO HEAD NECK W WO CM  Result Date: 03/21/2022 CLINICAL DATA:  Stroke follow-up EXAM: CT ANGIOGRAPHY HEAD AND NECK TECHNIQUE: Multidetector CT imaging of the head and neck was performed using the standard protocol during bolus administration of intravenous contrast. Multiplanar CT image reconstructions and MIPs were obtained to evaluate the vascular anatomy. Carotid stenosis measurements (when applicable) are obtained utilizing NASCET criteria, using the distal internal carotid diameter as the denominator. RADIATION DOSE REDUCTION: This exam was performed according to the departmental dose-optimization program which includes  automated exposure control, adjustment of the mA and/or kV according to patient size and/or use of iterative reconstruction technique. CONTRAST:  38m OMNIPAQUE IOHEXOL 350 MG/ML SOLN COMPARISON:  Head CT 03/20/2022 FINDINGS: CTA NECK FINDINGS SKELETON: There is no bony spinal canal stenosis. No lytic or blastic lesion. OTHER NECK: Normal pharynx, larynx and major salivary glands. No cervical lymphadenopathy. Unremarkable thyroid gland. UPPER CHEST: No pneumothorax or pleural effusion. No nodules or masses. AORTIC ARCH: There is calcific atherosclerosis of the aortic arch. There is no aneurysm, dissection or hemodynamically significant stenosis of the visualized portion of the aorta. Conventional 3 vessel aortic branching pattern. The visualized proximal subclavian arteries are widely patent. RIGHT CAROTID SYSTEM: Normal without aneurysm, dissection or stenosis. LEFT CAROTID SYSTEM: Normal without aneurysm, dissection or stenosis. VERTEBRAL ARTERIES: Left dominant configuration. Both origins are clearly patent. There is no dissection, occlusion or flow-limiting stenosis to the skull base (V1-V3 segments). CTA HEAD FINDINGS POSTERIOR CIRCULATION: --Vertebral arteries: Normal V4 segments. --Inferior cerebellar arteries: Normal. --Basilar artery: Normal. --Superior cerebellar arteries: Normal. --Posterior cerebral arteries (PCA): Mild atherosclerotic irregularity of the right PCA ANTERIOR CIRCULATION: --Intracranial internal carotid arteries: Normal. --Anterior cerebral arteries (ACA): Normal. Both A1 segments are present. Patent anterior communicating artery (a-comm). --Middle cerebral arteries (MCA): Normal. VENOUS SINUSES: As permitted by contrast timing, patent. ANATOMIC VARIANTS: None Review of the MIP images confirms the above findings. IMPRESSION: 1. No emergent large vessel occlusion or high-grade stenosis of the intracranial or cervical arteries. 2. Aortic Atherosclerosis (ICD10-I70.0). Electronically Signed    By: KUlyses JarredM.D.   On: 03/21/2022 03:48   CT Head Wo Contrast  Result Date: 03/20/2022 CLINICAL DATA:  Altered mental status and right facial droop EXAM: CT HEAD WITHOUT CONTRAST TECHNIQUE: Contiguous axial images were obtained from the base of the skull through the vertex without intravenous contrast. RADIATION DOSE REDUCTION: This exam was performed according to the departmental dose-optimization program which includes automated exposure control, adjustment of the mA and/or kV according to patient size and/or use of iterative reconstruction technique. COMPARISON:  12/31/2021 FINDINGS: Brain: Chronic atrophic changes are noted. Old lacunar infarct in the right internal capsule, right basal ganglia and adjacent to the head of the caudate nucleus is seen. No acute hemorrhage or acute infarction is noted. Vascular: No hyperdense vessel or unexpected calcification. Skull: Normal. Negative for fracture or focal lesion. Sinuses/Orbits: No acute finding. Other: None. IMPRESSION: Chronic ischemic change without acute abnormality. Electronically Signed   By: MInez CatalinaM.D.   On: 03/20/2022 22:36   MR Brain W and Wo Contrast  Result Date: 03/21/2022 CLINICAL DATA:  Slurred speech EXAM: MRI HEAD WITHOUT AND WITH CONTRAST TECHNIQUE: Multiplanar, multiecho pulse  sequences of the brain and surrounding structures were obtained without and with intravenous contrast. CONTRAST:  27m GADAVIST GADOBUTROL 1 MMOL/ML IV SOLN COMPARISON:  10/19/2020 FINDINGS: Brain: There is a small focus of acute ischemia within the left caudate body. Old right basal ganglia small vessel infarct. No acute or chronic hemorrhage. There is multifocal hyperintense T2-weighted signal within the white matter. Generalized cerebral volume loss. The midline structures are normal. There is no abnormal contrast enhancement. Vascular: Major flow voids are preserved. Skull and upper cervical spine: Normal calvarium and skull base. Visualized upper  cervical spine and soft tissues are normal. Sinuses/Orbits:No paranasal sinus fluid levels or advanced mucosal thickening. No mastoid or middle ear effusion. Normal orbits. IMPRESSION: 1. Small focus of acute ischemia within the left caudate body. No hemorrhage or mass effect. 2. Old right basal ganglia small vessel infarct and findings of chronic microvascular disease. Electronically Signed   By: KUlyses JarredM.D.   On: 03/21/2022 02:06   ECHOCARDIOGRAM COMPLETE BUBBLE STUDY  Result Date: 03/21/2022    ECHOCARDIOGRAM REPORT   Patient Name:   FJENAVEVE FENSTERMAKERDate of Exam: 03/21/2022 Medical Rec #:  [id number]        Height:       63.0 in Accession #:    [id number]       Weight:       180.0 lb Date of Birth:  1Jul 03, 1942       BSA:          1.849 m Patient Age:    89years          BP:           169/72 mmHg Patient Gender: F                 HR:           69 bpm. Exam Location:  Inpatient Procedure: 2D Echo, Cardiac Doppler, Color Doppler and Saline Contrast Bubble            Study Indications:    Stroke 434.91 / I63.9  History:        Patient has prior history of Echocardiogram examinations, most                 recent 04/21/2012. Risk Factors:Hypertension, Diabetes and                 Dyslipidemia. Chronic kidney disease. Cancer. Thyroid disease.                 GERD.  Sonographer:    TDarlina SicilianRDCS Referring Phys: [id number] Sonographer Comments: Suboptimal parasternal window and no apical window. IMPRESSIONS  1. Left ventricular ejection fraction, by estimation, is 60 to 65%. The left ventricle has normal function. The left ventricle has no regional wall motion abnormalities.  2. Right ventricular systolic function is normal. The right ventricular size is normal.  3. Agtated saline injected There was no large R to L shunt noted but with acoustic windows being so poor, cannot exclude tiny shunt.  4. The mitral valve is normal in structure. Trivial mitral valve regurgitation.  5. The aortic  valve is tricuspid. Aortic valve regurgitation is not visualized. Aortic valve sclerosis is present, with no evidence of aortic valve stenosis. FINDINGS  Left Ventricle: Left ventricular ejection fraction, by estimation, is 60 to 65%. The left ventricle has normal function. The left ventricle has no regional wall motion abnormalities. The left ventricular internal  cavity size was normal in size. There is  no left ventricular hypertrophy. Right Ventricle: The right ventricular size is normal. Right vetricular wall thickness was not assessed. Right ventricular systolic function is normal. Left Atrium: Left atrial size was normal in size. Right Atrium: Right atrial size was normal in size. Pericardium: Trivial pericardial effusion is present. Mitral Valve: The mitral valve is normal in structure. Mild mitral annular calcification. Trivial mitral valve regurgitation. Tricuspid Valve: The tricuspid valve is normal in structure. Tricuspid valve regurgitation is trivial. Aortic Valve: The aortic valve is tricuspid. Aortic valve regurgitation is not visualized. Aortic valve sclerosis is present, with no evidence of aortic valve stenosis. Pulmonic Valve: The pulmonic valve was not well visualized. Pulmonic valve regurgitation is not visualized. Aorta: The aortic root is normal in size and structure. IAS/Shunts: Agitated saline contrast was given intravenously to evaluate for intracardiac shunting.  LEFT VENTRICLE PLAX 2D LVIDd:         5.10 cm   Diastology LVIDs:         3.10 cm   LV e' medial:    5.00 cm/s LV PW:         0.90 cm   LV E/e' medial:  9.2 LV IVS:        0.80 cm   LV e' lateral:   4.05 cm/s LVOT diam:     1.90 cm   LV E/e' lateral: 11.4 LV SV:         37 LV SV Index:   20 LVOT Area:     2.84 cm  RIGHT VENTRICLE RV S prime:     17.00 cm/s TAPSE (M-mode): 1.8 cm LEFT ATRIUM         Index       RIGHT ATRIUM           Index LA diam:    3.90 cm 2.11 cm/m  RA Area:     13.60 cm                                 RA  Volume:   30.40 ml  16.44 ml/m  AORTIC VALVE LVOT Vmax:   72.70 cm/s LVOT Vmean:  50.800 cm/s LVOT VTI:    0.130 m  AORTA Ao Root diam: 3.30 cm MITRAL VALVE MV Area (PHT): 2.62 cm    SHUNTS MV Decel Time: 289 msec    Systemic VTI:  0.13 m MV E velocity: 46.10 cm/s  Systemic Diam: 1.90 cm MV A velocity: 64.50 cm/s MV E/A ratio:  0.71 Dorris Carnes MD Electronically signed by Dorris Carnes MD Signature Date/Time: 03/21/2022/3:04:16 PM    Final     Microbiology: Results for orders placed or performed during the hospital encounter of 12/31/21  Resp Panel by RT-PCR (Flu A&B, Covid) Urine, Clean Catch     Status: None   Collection Time: 12/31/21  7:08 PM   Specimen: Urine, Clean Catch; Nasopharyngeal(NP) swabs in vial transport medium  Result Value Ref Range Status   SARS Coronavirus 2 by RT PCR NEGATIVE NEGATIVE Final    Comment: (NOTE) SARS-CoV-2 target nucleic acids are NOT DETECTED.  The SARS-CoV-2 RNA is generally detectable in upper respiratory specimens during the acute phase of infection. The lowest concentration of SARS-CoV-2 viral copies this assay can detect is 138 copies/mL. A negative result does not preclude SARS-Cov-2 infection and should not be used as the sole basis for treatment or other patient  management decisions. A negative result may occur with  improper specimen collection/handling, submission of specimen other than nasopharyngeal swab, presence of viral mutation(s) within the areas targeted by this assay, and inadequate number of viral copies(<138 copies/mL). A negative result must be combined with clinical observations, patient history, and epidemiological information. The expected result is Negative.  Fact Sheet for Patients:  EntrepreneurPulse.com.au  Fact Sheet for Healthcare Providers:  IncredibleEmployment.be  This test is no t yet approved or cleared by the [place] FDA and  has been authorized for detection and/or diagnosis  of SARS-CoV-2 by FDA under an Emergency Use Authorization (EUA). This EUA will remain  in effect (meaning this test can be used) for the duration of the COVID-19 declaration under Section 564(b)(1) of the Act, 21 U.S.C.section 360bbb-3(b)(1), unless the authorization is terminated  or revoked sooner.       Influenza A by PCR NEGATIVE NEGATIVE Final   Influenza B by PCR NEGATIVE NEGATIVE Final    Comment: (NOTE) The Xpert Xpress SARS-CoV-2/FLU/RSV plus assay is intended as an aid in the diagnosis of influenza from Nasopharyngeal swab specimens and should not be used as a sole basis for treatment. Nasal washings and aspirates are unacceptable for Xpert Xpress SARS-CoV-2/FLU/RSV testing.  Fact Sheet for Patients: EntrepreneurPulse.com.au  Fact Sheet for Healthcare Providers: IncredibleEmployment.be  This test is not yet approved or cleared by the [place] FDA and has been authorized for detection and/or diagnosis of SARS-CoV-2 by FDA under an Emergency Use Authorization (EUA). This EUA will remain in effect (meaning this test can be used) for the duration of the COVID-19 declaration under Section 564(b)(1) of the Act, 21 U.S.C. section 360bbb-3(b)(1), unless the authorization is terminated or revoked.  Performed at [facility], [address] [place], [place] [address]   Urine Culture     Status: None   Collection Time: 12/31/21  7:08 PM   Specimen: Urine, Clean Catch  Result Value Ref Range Status   Specimen Description   Final    URINE, CLEAN CATCH Performed at [facility], [address] [place], [place] [address]    Special Requests   Final    NONE Performed at [facility], [address] [place], [place] [address]    Culture   Final    NO GROWTH Performed at [facility], [place] [address] [place],  [address]    Report Status 01/02/2022 FINAL  Final     Labs: CBC: Recent Labs  Lab 03/19/22 1112 03/20/22 2106 03/21/22 0419 03/23/22 0357  WBC 6.3 6.7 8.0 5.8  NEUTROABS 4.3 4.6  --   --   HGB 13.3 12.7 12.8 12.4  HCT 39.7 37.7 38.0 38.8  MCV 100.0 99.7 99.0 102.6*  PLT 272 256 254 037   Basic Metabolic Panel: Recent Labs  Lab 03/19/22 1112 03/20/22 2106 03/21/22 0419 03/23/22 0357  NA 137 137 137 142  K 3.8 3.3* 3.3* 3.6  CL 98 99 99 107  CO2 _0 GLUCOSE 102* 122* 107* 108*  BUN _1 CREATININE 0.88 0.78 0.66 0.75  CALCIUM 9.8 9.3 9.5 9.1  MG  --   --  1.9  --   PHOS  --   --  3.6  --    Liver Function Tests: Recent Labs  Lab 03/19/22 1112 03/20/22 2106  AST 17 22  ALT 15 16  ALKPHOS 66 65  BILITOT 0.5 0.4  PROT 7.5 6.7  ALBUMIN 4.4 3.7   CBG: Recent Labs  Lab 03/20/22 2126  GLUCAP 132*    Discharge time spent: approximately 40 minutes spent on discharge counseling, evaluation of patient on day of discharge, and coordination of discharge planning with nursing, social work, pharmacy and case management  Signed: Edwin Dada, MD [facility] 03/23/2022

## 2022-03-23 NOTE — Plan of Care (Signed)
I went to place patient on SCDs per order, daughter at bedside stated Dr. Loleta Books said she did not need the SCDs. Will follow up with Dr. Loleta Books.   Problem: Education: Goal: Knowledge of disease or condition will improve Outcome: Progressing Goal: Knowledge of patient specific risk factors will improve (INDIVIDUALIZE FOR PATIENT) Outcome: Progressing   Problem: Health Behavior/Discharge Planning: Goal: Ability to manage health-related needs will improve Outcome: Progressing   Problem: Activity: Goal: Risk for activity intolerance will decrease Outcome: Progressing   Problem: Pain Managment: Goal: General experience of comfort will improve Outcome: Progressing   Problem: Safety: Goal: Ability to remain free from injury will improve Outcome: Progressing   Problem: Skin Integrity: Goal: Risk for impaired skin integrity will decrease Outcome: Progressing

## 2022-03-23 NOTE — Progress Notes (Signed)
Occupational Therapy Treatment Patient Details Name: Jessica [name] MRN: [id number] DOB: [birth year] Today's Date: 03/23/2022   History of present illness Jessica [name] is a 81 y.o. female who  presented to the emergency room on 03/20/2022 for evaluation of confusion and weakness. MRI revealed a left caudate/external capsule stroke. Past medical history of triple negative stage IIa ductal carcinoma of the left breast cancer s/p partial mastectomy 2018, HLD, HTN.   OT comments  Session focused on standing balance and breaking posterior lean to improve ability to complete LB ADLs and transfers with decreased assist. Pt requires overall Min-Mod A to stand with improving confidence noted when using Stedy for therapeutic activities. Pt with noted coordination deficits hindering ability to manipulate ADL items successfully, requiring Mod A for LB dressing tasks today. [name] daughter present and supportive, reports some concerns over pt resting with hands in flexed position. Educated on AROM and stretching exercises for B hands to prevent stiffness; no tightness or contracture risk noted at this time. Continue to rec AIR level therapies as pt is significantly below her active, independent baseline. Anticipate good progress with consistent therapies.   Recommendations for follow up therapy are one component of a multi-disciplinary discharge planning process, led by the attending physician.  Recommendations may be updated based on patient status, additional functional criteria and insurance authorization.    Follow Up Recommendations  Acute inpatient rehab (3hours/day)    Assistance Recommended at Discharge Frequent or constant Supervision/Assistance  Patient can return home with the following  A lot of help with walking and/or transfers;A lot of help with bathing/dressing/bathroom;Direct supervision/assist for medications management;Direct supervision/assist for financial management;Assist for  transportation;Help with stairs or ramp for entrance   Equipment Recommendations  BSC/3in1;Other (comment) (RW)    Recommendations for Other Services Rehab consult    Precautions / Restrictions Precautions Precautions: Fall Restrictions Weight Bearing Restrictions: No       Mobility Bed Mobility               General bed mobility comments: up in chair on entry    Transfers Overall transfer level: Needs assistance Equipment used: 1 person hand held assist, Ambulation equipment used Transfers: Sit to/from Stand Sit to Stand: Mod assist           General transfer comment: Mod A to stand from chair with handheld assist. Min A to stand in [place] for balance/posture exercises     Balance Overall balance assessment: Needs assistance Sitting-balance support: No upper extremity supported, Feet supported Sitting balance-Leahy Scale: Fair     Standing balance support: No upper extremity supported Standing balance-Leahy Scale: Poor Standing balance comment: Reliant on at least one UE support in standing                           ADL either performed or assessed with clinical judgement   ADL Overall ADL's : Needs assistance/impaired Eating/Feeding: Set up;Sitting Eating/Feeding Details (indicate cue type and reason): drinking from cup w/ straw to take meds on entry                 Lower Body Dressing: Moderate assistance;Sit to/from stand;Sitting/lateral leans Lower Body Dressing Details (indicate cue type and reason): increased time/light assist to fully cross LEs to manage socks. Pt able to doff socks but requires assist to don around toes due to coordination deficits - able to manage remainder of task. Guided pt in simulated LB dressing to pull clothing up  over waist using L UE (R UE supported on Stedy for confidence/balance progression)               General ADL Comments: Emphasis on breaking posterior bias (noted with PT session prior),  standing balance/confidence in [place] for LB ADLs and marching in place. Encouraged AROM of B hands (composite flexion/extension, opposition) for strength/coordination. Pt/staff with concerns of pt resting with hands in a fist, educated daughter on stretching/AROM and if ROM WFL without tightness then no splint needed at this time    Extremity/Trunk Assessment Upper Extremity Assessment Upper Extremity Assessment: RUE deficits/detail;LUE deficits/detail RUE Deficits / Details: 4/5 gross. slow and deliberate coordination; shoulder flexion slow up to 85* AAROM. tremors worse since CVA per daughter, RUE Sensation: WNL RUE Coordination: decreased fine motor LUE Deficits / Details: hx of frozen shoulder per pt/daughter, shoulder flex to 85*. tremors worse since CVA per daughter LUE Sensation: WNL LUE Coordination: decreased fine motor   Lower Extremity Assessment Lower Extremity Assessment: Defer to PT evaluation        Vision   Vision Assessment?: Vision impaired- to be further tested in functional context   Perception     Praxis      Cognition Arousal/Alertness: Awake/alert Behavior During Therapy: Flat affect Overall Cognitive Status: Impaired/Different from baseline Area of Impairment: Problem solving                             Problem Solving: Slow processing, Decreased initiation, Requires verbal cues General Comments: Flat affect but responds appropriately consistently, minimal verbalizations. Follows directions consistently but benefits from sequencing/initiation cues        Exercises Exercises: Other exercises Other Exercises Other Exercises: AROM composite flexion/extension of B hands, opposition Other Exercises: Marching in place with HHA and then [facility]    Shoulder Instructions       General Comments [name] daughter present and supportive    Pertinent Vitals/ Pain       Pain Assessment Pain Assessment: Faces Faces Pain Scale: Hurts a little  bit Pain Location: LUE Pain Descriptors / Indicators: Grimacing, Guarding Pain Intervention(s): Monitored during session  Home Living                                          Prior Functioning/Environment              Frequency  Min 2X/week        Progress Toward Goals  OT Goals(current goals can now be found in the care plan section)  Progress towards OT goals: Progressing toward goals  Acute Rehab OT Goals Patient Stated Goal: regain independence OT Goal Formulation: With patient Time For Goal Achievement: 04/04/22 Potential to Achieve Goals: Good ADL Goals Pt Will Perform Grooming: with modified independence;standing Pt Will Perform Lower Body Bathing: with modified independence;sit to/from stand Pt Will Perform Lower Body Dressing: with modified independence;sit to/from stand Pt Will Transfer to Toilet: with modified independence;ambulating Additional ADL Goal #1: Pt will demonstrate increased activity tolerance to complete at least 3 ADLs in standing given supervision A  Plan Discharge plan remains appropriate    Co-evaluation                 AM-PAC OT "6 Clicks" Daily Activity     Outcome Measure   Help from another person eating meals?: A Little Help  from another person taking care of personal grooming?: A Little Help from another person toileting, which includes using toliet, bedpan, or urinal?: A Lot Help from another person bathing (including washing, rinsing, drying)?: A Lot Help from another person to put on and taking off regular upper body clothing?: A Little Help from another person to put on and taking off regular lower body clothing?: A Lot 6 Click Score: 15    End of Session Equipment Utilized During Treatment: Gait belt  OT Visit Diagnosis: Unsteadiness on feet (R26.81);Other abnormalities of gait and mobility (R26.89);Muscle weakness (generalized) (M62.81);Hemiplegia and hemiparesis Hemiplegia - Right/Left:  Right Hemiplegia - dominant/non-dominant: Dominant Hemiplegia - caused by: Cerebral infarction   Activity Tolerance Patient tolerated treatment well   Patient Left in chair;with call bell/phone within reach;with family/visitor present   Nurse Communication Mobility status        Time: 1030-1103 OT Time Calculation (min): 33 min  Charges: OT General Charges $OT Visit: 1 Visit OT Treatments $Self Care/Home Management : 8-22 mins $Therapeutic Activity: 8-22 mins  Malachy Chamber, OTR/L Acute Rehab Services Office: [phone]   Layla Maw 03/23/2022, 11:21 AM

## 2022-03-23 NOTE — H&P (Signed)
Physical Medicine and Rehabilitation Admission H&P        Chief Complaint  Patient presents with   Altered Mental Status  : HPI: Jessica [name] is a 81 year old right-handed female with history of hypertension, hyperlipidemia, hypothyroidism, asthma, depression/anxiety, left breast cancer status post partial mastectomy 2018 as well as radiation therapy, obesity with BMI 31.89.  Per chart review patient lives alone.  1 level home one-step to entry.  Independent prior to admission.  Presented 03/20/2022 with right-sided weakness slurred speech and altered mental status with right facial droop of acute onset.  CT/MRI showed small focus of acute ischemia within the left caudate body.  No hemorrhage or mass effect.  Old right basal ganglia small vessel infarct and findings of chronic microvascular disease.  Patient did not receive tPA.  CT angiogram head and neck no emergent large vessel occlusion.  Echocardiogram ejection fraction of 60 to 65% no wall motion abnormalities.  Admission chemistries unremarkable except potassium 3.3, ammonia level within normal limits, urine drug screen negative.  Presently maintained on low-dose aspirin as well as Plavix 75 mg daily for CVA prophylaxis x3 months then aspirin alone.  Lovenox for DVT prophylaxis.  Therapy evaluations completed due to patient decreased functional mobility was admitted for a comprehensive rehab program.   Review of Systems  Constitutional:  Negative for chills and fever.  HENT:  Negative for hearing loss.   Eyes:  Negative for blurred vision and double vision.  Respiratory:  Negative for cough and shortness of breath.   Cardiovascular:  Negative for chest pain and palpitations.  Gastrointestinal:  Positive for constipation. Negative for heartburn, nausea and vomiting.       GERD  Genitourinary:  Negative for dysuria, flank pain and hematuria.  Musculoskeletal:  Positive for joint pain and myalgias.  Skin:  Negative for rash.   Psychiatric/Behavioral:  Positive for depression.        Anxiety  All other systems reviewed and are negative.     Past Medical History:  Diagnosis Date   Allergy     Anemia     Anxiety     Asthma      in past, no inhalers now   Blood transfusion without reported diagnosis     Breast cancer ([place]) 01/2017    left breast    Breast cancer of upper-inner quadrant of left female breast ([place]) 03/11/2017   Cancer ([place]) 01/2017    left breast   Cataract      bilateral removed   Chronic kidney disease      kidney stones   Depression 08/31/2013   Dyslipidemia, goal LDL below 160 08/31/2013   Essential hypertension - well controlled 08/31/2013   Family history of breast cancer     Family history of melanoma     Family history of prostate cancer     GERD (gastroesophageal reflux disease)     Goals of care, counseling/discussion 03/11/2017   History of radiation therapy 10/13/17-11/11/17    left breast 40.05 Gy in 15 fractions, left breast boost 10 Gy in 5 fractions   Hypothyroidism     Obesity (BMI 30-39.9) 08/31/2013   Personal history of chemotherapy 2018   Personal history of radiation therapy 2019   Thyroid disease     Vasovagal near-syncope -rare 08/31/2013         Past Surgical History:  Procedure Laterality Date   ABDOMINAL HYSTERECTOMY        total   BREAST BIOPSY Left 02/24/2017  malignant   BREAST LUMPECTOMY Left 03/23/2017   broken fingers       CATARACT EXTRACTION Bilateral     COLONOSCOPY       EXCISIONAL HEMORRHOIDECTOMY       IR FLUORO GUIDE PORT INSERTION RIGHT   08/04/2017   IR REMOVAL TUN ACCESS W/ PORT W/O FL MOD SED   08/04/2017   IR US GUIDE VASC ACCESS RIGHT   08/04/2017   IRRIGATION AND DEBRIDEMENT ABSCESS Left 05/18/2017    Procedure: IRRIGATION AND DEBRIDEMENT LEFT AXILLARY ABSCESS;  Surgeon: Michael Boston, MD;  Location: [facility];  Service: General;  Laterality: Left;   kidney stones lithotripsy       PORTACATH PLACEMENT Right 03/23/2017    Procedure:  INSERTION PORT-A-CATH;  Surgeon: Erroll Luna, MD;  Location: [facility];  Service: General;  Laterality: Right;   RADIOACTIVE SEED GUIDED PARTIAL MASTECTOMY WITH AXILLARY SENTINEL LYMPH NODE BIOPSY Left 03/23/2017    Procedure: LEFT BREAST RADIOACTIVE SEED GUIDED PARTIAL MASTECTOMY AND  SENTINEL LYMPH NODE [place];  Surgeon: Erroll Luna, MD;  Location: [facility];  Service: General;  Laterality: Left;   ROTATOR CUFF REPAIR        left    TONSILLECTOMY       WISDOM TOOTH EXTRACTION             Family History  Problem Relation Age of Onset   Heart disease Mother          Angina   Heart disease Father     Cervical cancer Maternal Aunt     Heart attack Brother          Died of MI in his mid 21s   Prostate cancer Brother          dx in his early 70s   Breast cancer Paternal Aunt          dx over 41   Bone cancer Cousin          maternal first cousin dx in her 59s-60s   Lung cancer Cousin          paternal first cousin   Colon cancer Neg Hx     Pancreatic cancer Neg Hx     Stomach cancer Neg Hx     Esophageal cancer Neg Hx     Rectal cancer Neg Hx      Social History:  reports that she has never smoked. She has never used smokeless tobacco. She reports that she does not drink alcohol and does not use drugs. Allergies:       Allergies  Allergen Reactions   Advil [Ibuprofen] Hives and Itching   Codeine Hives   Penicillins Hives      Has patient had a PCN reaction causing immediate rash, facial/tongue/throat swelling, SOB or lightheadedness with hypotension: yes Has patient had a PCN reaction causing severe rash involving mucus membranes or skin necrosis:  no Has patient had a PCN reaction that required hospitalization:  no Has patient had a PCN reaction occurring within the last 10 years: no If all of the above answers are "NO", then may proceed with Cephalosporin use. Has patient had a PCN reaction causing immediate rash, facial/tongue/throat  swelling, SOB or lightheadedness with hypotension: yes Has patient had a PCN reaction causing severe rash involving mucus membranes or skin necrosis:  no Has patient had a PCN reaction that required hospitalization:  no Has patient had a PCN reaction occurring within the last 10 years: no  If all of the above answers are "NO", then may proceed with Cephalosporin use.     Sulfamethoxazole-Trimethoprim Hives   Band-Aid Plus Antibiotic [Bacitracin-Polymyxin B] Dermatitis   Benazepril Swelling      Possible cause of tongue swelling   Other Rash      Band-aid          Medications Prior to Admission  Medication Sig Dispense Refill   buPROPion (WELLBUTRIN XL) 150 MG 24 hr tablet Take 150 mg by mouth every morning.       cholecalciferol (VITAMIN D3) 25 MCG (1000 UNIT) tablet Take 1,000 Units by mouth daily.       hydrochlorothiazide (MICROZIDE) 12.5 MG capsule Take 12.5 mg by mouth daily.   1   levothyroxine (SYNTHROID, LEVOTHROID) 75 MCG tablet Take 75 mcg by mouth daily.       mirtazapine (REMERON) 7.5 MG tablet Take 7.5 mg by mouth at bedtime.       Multiple Vitamin (MULTIVITAMIN) tablet Take 1 tablet by mouth daily.       Multiple Vitamins-Minerals (OCUVITE ADULT FORMULA PO) Take by mouth daily.       omeprazole (PRILOSEC) 20 MG capsule Take 1 capsule by mouth 2 (two) times daily.       venlafaxine XR (EFFEXOR-XR) 75 MG 24 hr capsule Take 225 mg by mouth daily.       lactulose (CHRONULAC) 10 GM/15ML solution TAKE 30 ML BY MOUTH EVERY 4 HOURS AS NEEDED FOR MILD CONSTIPATION UNTIL BOWEL MOVEMENT (Patient not taking: Reported on 03/20/2022)       lactulose, encephalopathy, (GENERLAC) 10 GM/15ML SOLN TAKE 30 ML BY MOUTH EVERY 4 HOURS AS NEEDED FOR MILD CONSTIPATION UNTIL BOWEL MOVEMENT (Patient not taking: Reported on 03/20/2022) 21600 mL 3   lidocaine-prilocaine (EMLA) cream Apply 1 application topically as needed. (Patient not taking: Reported on 03/20/2022) 30 g 6   Lysine 1000 MG TABS Take 1,000  mg by mouth as needed. (Patient not taking: Reported on 03/14/2021)       omeprazole (PRILOSEC) 20 MG capsule 2 (two) times daily before a meal. (Patient not taking: Reported on 03/20/2022)       potassium chloride SA (KLOR-CON) 20 MEQ tablet Take 1 tablet (20 mEq total) by mouth daily. (Patient not taking: Reported on 03/20/2022) 14 tablet 0          Home: [place] expects to be discharged to:: Private residence Living Arrangements: Alone Available Help at Discharge: Family, Available 24 hours/day Type of Home: House Home Access: Stairs to enter [employer] of Steps: 1 in front, 2 in garage Entrance Stairs-Rails: None Home Layout: One level Bathroom Shower/Tub: Multimedia programmer: Handicapped height Bathroom Accessibility: Yes Home Equipment: None   Functional History: Prior Function Prior Level of Function : Independent/Modified Independent, Driving Mobility Comments: enjoys scrapbooking ADLs Comments: daughter assists with finances   Functional Status:  Mobility: Bed Mobility Overal bed mobility: Needs Assistance Bed Mobility: Supine to Sit Supine to sit: HOB elevated, Mod assist Sit to supine: Min assist General bed mobility comments: up in chair on entry Transfers Overall transfer level: Needs assistance Equipment used: 1 person hand held assist, Ambulation equipment used Transfers: Sit to/from Stand Sit to Stand: Mod assist General transfer comment: Mod A to stand from chair with handheld assist. Min A to stand in [place] for balance/posture exercises Ambulation/Gait Ambulation/Gait assistance: Mod assist, Max assist Gait Distance (Feet): 5 Feet Assistive device: 1 person hand held assist Gait Pattern/deviations: Step-through pattern, Decreased  stride length General Gait Details: pt with short step-through gait, increased lateral sway, severe posterior lean with pt unable to correct with multimodal cues max asssit to maintain  upright, loss of balance with stepping back toward recliner, pt prematurly sitting, Gait velocity: reduced Gait velocity interpretation: <1.31 ft/sec, indicative of household ambulator   ADL: ADL Overall ADL's : Needs assistance/impaired Eating/Feeding: Set up, Sitting Eating/Feeding Details (indicate cue type and reason): drinking from cup w/ straw to take meds on entry Grooming: Set up, Min guard, Sitting Upper Body Bathing: Minimal assistance, Sitting Lower Body Bathing: Maximal assistance, Sit to/from stand Upper Body Dressing : Min guard, Set up, Sitting Lower Body Dressing: Moderate assistance, Sit to/from stand, Sitting/lateral leans Lower Body Dressing Details (indicate cue type and reason): increased time/light assist to fully cross LEs to manage socks. Pt able to doff socks but requires assist to don around toes due to coordination deficits - able to manage remainder of task. Guided pt in simulated LB dressing to pull clothing up over waist using L UE (R UE supported on [place] for confidence/balance progression) Toilet Transfer: Minimal assistance, Ambulation Toilet Transfer Details (indicate cue type and reason): hand hold assist & small shufflingsteps Toileting- Clothing Manipulation and Hygiene: Minimal assistance, Sitting/lateral lean Functional mobility during ADLs: Minimal assistance, Cueing for safety, Cueing for sequencing General ADL Comments: Emphasis on breaking posterior bias (noted with PT session prior), standing balance/confidence in [place] for LB ADLs and marching in place. Encouraged AROM of B hands (composite flexion/extension, opposition) for strength/coordination. Pt/staff with concerns of pt resting with hands in a fist, educated daughter on stretching/AROM and if ROM WFL without tightness then no splint needed at this time   Cognition: Cognition Overall Cognitive Status: Impaired/Different from baseline Orientation Level: Oriented  X4 Cognition Arousal/Alertness: Awake/alert Behavior During Therapy: Flat affect Overall Cognitive Status: Impaired/Different from baseline Area of Impairment: Problem solving Following Commands: Follows one step commands with increased time Awareness: Emergent Problem Solving: Slow processing, Decreased initiation, Requires verbal cues General Comments: Flat affect but responds appropriately consistently, minimal verbalizations. Follows directions consistently but benefits from sequencing/initiation cues   Physical Exam: Blood pressure (!) 175/74, pulse 62, temperature 98.3 F (36.8 C), temperature source Oral, resp. rate 17, height '5\' 3"'$  (1.6 m), weight 81.6 kg, SpO2 93 %. Physical Exam Constitutional:      General: She is not in acute distress.    Appearance: She is obese.  HENT:     Head: Normocephalic and atraumatic.     Right Ear: External ear normal.     Left Ear: External ear normal.     Nose: Nose normal.     Mouth/Throat:     Mouth: Mucous membranes are moist.  Eyes:     Extraocular Movements: Extraocular movements intact.     Conjunctiva/sclera: Conjunctivae normal.     Pupils: Pupils are equal, round, and reactive to light.  Cardiovascular:     Rate and Rhythm: Normal rate and regular rhythm.     Heart sounds: No murmur heard.   No gallop.  Pulmonary:     Effort: Pulmonary effort is normal. No respiratory distress.     Breath sounds: No wheezing.  Abdominal:     General: Bowel sounds are normal. There is no distension.     Palpations: Abdomen is soft.     Tenderness: There is no abdominal tenderness.  Musculoskeletal:        General: No swelling or tenderness. Normal range of motion.     Cervical  back: Normal range of motion.  Skin:    General: Skin is warm and dry.     Comments: Left mastectomy scar  Neurological:     Comments: Patient is alert.  Makes eye contact with examiner.  Speech is mildly dysarthric but intelligible.  Provides name and age.  Follows  simple commands. Provides biographical information/address. Right central 7. RUE 4/5 with decreased [place]. LUE 4+ to 5/5. RLE 2+/5 prox to 4/5 distally, LLE 3/5 prox to 4+/5 distally. Senses pain and light touch in all 4's. DTR's 1+. No abnl resting tone.   Psychiatric:     Comments: Flat, cooperative      Lab Results Last 48 Hours        Results for orders placed or performed during the hospital encounter of 03/20/22 (from the past 48 hour(s))  Basic metabolic panel     Status: Abnormal    Collection Time: 03/23/22  3:57 AM  Result Value Ref Range    Sodium 142 135 - 145 mmol/L    Potassium 3.6 3.5 - 5.1 mmol/L    Chloride 107 98 - 111 mmol/L    CO2 28 22 - 32 mmol/L    Glucose, Bld 108 (H) 70 - 99 mg/dL      Comment: Glucose reference range applies only to samples taken after fasting for at least 8 hours.    BUN 8 8 - 23 mg/dL    Creatinine, Ser 0.75 0.44 - 1.00 mg/dL    Calcium 9.1 8.9 - 10.3 mg/dL    GFR, Estimated >60 >60 mL/min      Comment: (NOTE) Calculated using the CKD-EPI Creatinine Equation (2021)      Anion gap 7 5 - 15      Comment: Performed at [facility] [address], [place], [place] [address]  CBC     Status: Abnormal    Collection Time: 03/23/22  3:57 AM  Result Value Ref Range    WBC 5.8 4.0 - 10.5 K/uL    RBC 3.78 (L) 3.87 - 5.11 MIL/uL    Hemoglobin 12.4 12.0 - 15.0 g/dL    HCT 38.8 36.0 - 46.0 %    MCV 102.6 (H) 80.0 - 100.0 fL    MCH 32.8 26.0 - 34.0 pg    MCHC 32.0 30.0 - 36.0 g/dL    RDW 13.9 11.5 - 15.5 %    Platelets 246 150 - 400 K/uL    nRBC 0.0 0.0 - 0.2 %      Comment: Performed at [facility], [place] [address] [place], [place] [address]       Imaging Results (Last 48 hours)  ECHOCARDIOGRAM COMPLETE BUBBLE STUDY   Result Date: 03/21/2022    ECHOCARDIOGRAM REPORT   Patient Name:   Jessica [name] Date of Exam: 03/21/2022 Medical Rec #:  [id number]         Height:       63.0 in Accession #:    [id number]        Weight:        180.0 lb Date of Birth:  1940/11/07        BSA:          1.849 m Patient Age:    56 years          BP:           169/72 mmHg Patient Gender: F  HR:           69 bpm. Exam Location:  Inpatient Procedure: 2D Echo, Cardiac Doppler, Color Doppler and Saline Contrast Bubble            Study Indications:    Stroke 434.91 / I63.9  History:        Patient has prior history of Echocardiogram examinations, most                 recent 04/21/2012. Risk Factors:Hypertension, Diabetes and                 Dyslipidemia. Chronic kidney disease. Cancer. Thyroid disease.                 GERD.  Sonographer:    Darlina Sicilian RDCS Referring Phys: [id number] [facility]  Sonographer Comments: Suboptimal parasternal window and no apical window. IMPRESSIONS  1. Left ventricular ejection fraction, by estimation, is 60 to 65%. The left ventricle has normal function. The left ventricle has no regional wall motion abnormalities.  2. Right ventricular systolic function is normal. The right ventricular size is normal.  3. Agtated saline injected There was no large R to L shunt noted but with acoustic windows being so poor, cannot exclude tiny shunt.  4. The mitral valve is normal in structure. Trivial mitral valve regurgitation.  5. The aortic valve is tricuspid. Aortic valve regurgitation is not visualized. Aortic valve sclerosis is present, with no evidence of aortic valve stenosis. FINDINGS  Left Ventricle: Left ventricular ejection fraction, by estimation, is 60 to 65%. The left ventricle has normal function. The left ventricle has no regional wall motion abnormalities. The left ventricular internal cavity size was normal in size. There is  no left ventricular hypertrophy. Right Ventricle: The right ventricular size is normal. Right vetricular wall thickness was not assessed. Right ventricular systolic function is normal. Left Atrium: Left atrial size was normal in size. Right Atrium: Right atrial size was normal in size.  Pericardium: Trivial pericardial effusion is present. Mitral Valve: The mitral valve is normal in structure. Mild mitral annular calcification. Trivial mitral valve regurgitation. Tricuspid Valve: The tricuspid valve is normal in structure. Tricuspid valve regurgitation is trivial. Aortic Valve: The aortic valve is tricuspid. Aortic valve regurgitation is not visualized. Aortic valve sclerosis is present, with no evidence of aortic valve stenosis. Pulmonic Valve: The pulmonic valve was not well visualized. Pulmonic valve regurgitation is not visualized. Aorta: The aortic root is normal in size and structure. IAS/Shunts: Agitated saline contrast was given intravenously to evaluate for intracardiac shunting.  LEFT VENTRICLE PLAX 2D LVIDd:         5.10 cm   Diastology LVIDs:         3.10 cm   LV e' medial:    5.00 cm/s LV PW:         0.90 cm   LV E/e' medial:  9.2 LV IVS:        0.80 cm   LV e' lateral:   4.05 cm/s LVOT diam:     1.90 cm   LV E/e' lateral: 11.4 LV SV:         37 LV SV Index:   20 LVOT Area:     2.84 cm  RIGHT VENTRICLE RV S prime:     17.00 cm/s TAPSE (M-mode): 1.8 cm LEFT ATRIUM         Index       RIGHT ATRIUM  Index LA diam:    3.90 cm 2.11 cm/m  RA Area:     13.60 cm                                 RA Volume:   30.40 ml  16.44 ml/m  AORTIC VALVE LVOT Vmax:   72.70 cm/s LVOT Vmean:  50.800 cm/s LVOT VTI:    0.130 m  AORTA Ao Root diam: 3.30 cm MITRAL VALVE MV Area (PHT): 2.62 cm    SHUNTS MV Decel Time: 289 msec    Systemic VTI:  0.13 m MV E velocity: 46.10 cm/s  Systemic Diam: 1.90 cm MV A velocity: 64.50 cm/s MV E/A ratio:  0.71 Dorris Carnes MD Electronically signed by Dorris Carnes MD Signature Date/Time: 03/21/2022/3:04:16 PM    Final           Blood pressure (!) 175/74, pulse 62, temperature 98.3 F (36.8 C), temperature source Oral, resp. rate 17, height '5\' 3"'$  (1.6 m), weight 81.6 kg, SpO2 93 %.   Medical Problem List and Plan: 1. Functional deficits secondary to acute ischemic  infarct left caudate body.             -patient may shower             -ELOS/Goals: 7-10 days, mod I goals with PT, OT, SLP 2.  Antithrombotics: -DVT/anticoagulation:  Pharmaceutical: Lovenox             -antiplatelet therapy: Aspirin 81 mg daily and Plavix 75 mg day x3 months then aspirin alone as noted by neurology services Dr.Palikh 3. Pain Management: Tylenol as needed 4. Mood: Wellbutrin 150 mg daily, Effexor 225 mg daily, Remeron 7.5 mg nightly             -antipsychotic agents: N/A             -team to provide ego support as needed 5. Neuropsych: This patient is capable of making decisions on her own behalf. 6. Skin/Wound Care: Routine skin checks 7. Fluids/Electrolytes/Nutrition: Routine in and outs with follow-up chemistries 8.  Hypertension.  Hydrochlorothiazide 12.5 mg daily added tday.  Monitor with increased mobility             -5/22 bp borderline, monitor with increasing activity and adjust regimen as necessary 9.  Hypothyroidism.  Synthroid 10.  Hyperlipidemia.  Lipitor. Pt with history of statin intolerance? 11.  Constipation.  MiraLAX daily, Senokot-S daily 12.  GERD.  Protonix 13.  History of left breast cancer.  Status postmastectomy/radiation therapy 2018   Cathlyn Parsons, PA-C 03/23/2022   I have personally performed a face to face diagnostic evaluation of this patient and formulated the key components of the plan.  Additionally, I have personally reviewed laboratory data, imaging studies, as well as relevant notes and concur with the physician assistant's documentation above.  The patient's status has not changed from the original H&P.  Any changes in documentation from the acute care chart have been noted above.  Meredith Staggers, MD, [facility]

## 2022-03-23 NOTE — Progress Notes (Signed)
Physical Therapy Treatment Patient Details Name: Jessica [name] MRN: [id number] DOB: [birth year] Today's Date: 03/23/2022   History of Present Illness Jessica [name] is a 81 y.o. female who  presented to the emergency room on 03/20/2022 for evaluation of confusion and weakness. MRI revealed a left caudate/external capsule stroke. Past medical history of triple negative stage IIa ductal carcinoma of the left breast cancer s/p partial mastectomy 2018, HLD, HTN.    PT Comments    Pt received supine and agreeable to session with focus on progression of OOB mobility with good particiaption. Pt able to come to sitting EOB with mod assist and step by step cues to bring BLEs to and off bed and to reach for bedrails to elevate trunk. Once sitting EOB pt able to maintain static sitting without UE support for ~5 mins. Pt requiring up to mod assist to come to standing to facilitate anterior lean as pt leaning posterior against bed on ascent. Pt unable to maintain standing without mod-max a secondary to posterior lean with pt unable to correct. Pt able to take slow steps to recliner with HHA x1 and mod assist secondary to posterior lean. Pt needing tactile cues to reach for chair armrest to come to sitting. Pt with good tolerance for sitting balance exercise to facilitate anterior weight shift for carryover for transfers and ambulation. Current plan remains appropriate to address deficits and maximize functional independence and decrease caregiver burden. Pt continues to benefit from skilled PT services to progress toward functional mobility goals.     Recommendations for follow up therapy are one component of a multi-disciplinary discharge planning process, led by the attending physician.  Recommendations may be updated based on patient status, additional functional criteria and insurance authorization.  Follow Up Recommendations  Acute inpatient rehab (3hours/day)     Assistance Recommended at Discharge  Frequent or constant Supervision/Assistance  Patient can return home with the following A little help with walking and/or transfers;A little help with bathing/dressing/bathroom;Assistance with cooking/housework;Assist for transportation;Help with stairs or ramp for entrance   Equipment Recommendations  Rolling walker (2 wheels);BSC/3in1    Recommendations for Other Services Rehab consult     Precautions / Restrictions Precautions Precautions: Fall Restrictions Weight Bearing Restrictions: No     Mobility  Bed Mobility Overal bed mobility: Needs Assistance Bed Mobility: Supine to Sit     Supine to sit: HOB elevated, Mod assist     General bed mobility comments: increased time step by step cues to    Transfers Overall transfer level: Needs assistance Equipment used: 1 person hand held assist Transfers: Sit to/from Stand Sit to Stand: Mod assist           General transfer comment: posterior lean againts EOB, mod assist to facilitate anterior lean and to power up    Ambulation/Gait Ambulation/Gait assistance: Mod assist, Max assist Gait Distance (Feet): 5 Feet Assistive device: 1 person hand held assist Gait Pattern/deviations: Step-through pattern, Decreased stride length Gait velocity: reduced     General Gait Details: pt with short step-through gait, increased lateral sway, severe posterior lean with pt unable to correct with multimodal cues max asssit to maintain upright, loss of balance with stepping back toward recliner, pt prematurly sitting,   Stairs             Wheelchair Mobility    Modified Rankin (Stroke Patients Only)       Balance Overall balance assessment: Needs assistance Sitting-balance support: No upper extremity supported, Feet supported Sitting balance-Leahy  Scale: Poor Sitting balance - Comments: able to maintain sitting EOB without UE support   Standing balance support: No upper extremity supported Standing balance-Leahy  Scale: Poor Standing balance comment: max a to maintain upright with HHA                            Cognition Arousal/Alertness: Awake/alert Behavior During Therapy: Flat affect Overall Cognitive Status: Impaired/Different from baseline Area of Impairment: Problem solving                       Following Commands: Follows one step commands with increased time   Awareness: Emergent Problem Solving: Slow processing          Exercises Other Exercises Other Exercises: sitting edge of reclienr with hands on PTA arms rocking back and forth to encourage anterior lean for trasnfers and ambulation, encouraging pt to push PTA away    General Comments General comments (skin integrity, edema, etc.): VSS on RA, pt very quiet throughout session, answering some questions with increased time      Pertinent Vitals/Pain Pain Assessment Pain Assessment: Faces Faces Pain Scale: Hurts little more Pain Location: LUE Pain Descriptors / Indicators: Grimacing, Guarding Pain Intervention(s): Limited activity within patient's tolerance, Monitored during session, Repositioned    Home Living                          Prior Function            PT Goals (current goals can now be found in the care plan section) Acute Rehab PT Goals Patient Stated Goal: to return to independence PT Goal Formulation: With patient/family Time For Goal Achievement: 04/04/22    Frequency    Min 4X/week      PT Plan      Co-evaluation              AM-PAC PT "6 Clicks" Mobility   Outcome Measure  Help needed turning from your back to your side while in a flat bed without using bedrails?: A Little Help needed moving from lying on your back to sitting on the side of a flat bed without using bedrails?: A Little Help needed moving to and from a bed to a chair (including a wheelchair)?: A Little Help needed standing up from a chair using your arms (e.g., wheelchair or bedside  chair)?: A Lot Help needed to walk in hospital room?: Total Help needed climbing 3-5 steps with a railing? : Total 6 Click Score: 13    End of Session Equipment Utilized During Treatment: Gait belt Activity Tolerance: Patient tolerated treatment well Patient left: in chair;with call bell/phone within reach;with family/visitor present Nurse Communication: Mobility status PT Visit Diagnosis: Other abnormalities of gait and mobility (R26.89);Muscle weakness (generalized) (M62.81)     Time: 1751-0258 PT Time Calculation (min) (ACUTE ONLY): 32 min  Charges:  $Gait Training: 8-22 mins $Therapeutic Exercise: 8-22 mins                     Caidan Hubbert R. PTA Acute Rehabilitation Services Office: [place] 03/23/2022, 10:23 AM

## 2022-03-23 NOTE — Progress Notes (Signed)
Inpatient Rehabilitation Admission Medication Review by a Pharmacist  A complete drug regimen review was completed for this patient to identify any potential clinically significant medication issues.  High Risk Drug Classes Is patient taking? Indication by Medication  Antipsychotic No   Anticoagulant Yes Lovenox- VTE prophylaxis  Antibiotic No   Opioid No   Antiplatelet Yes Aspirin, Plavix- CVA prophylaxis  Hypoglycemics/insulin No   Vasoactive Medication Yes HCTZ- hypertension  Chemotherapy No   Other Yes Lipitor- HLD Wellbutrin- MDD Synthroid- hypothyroidism Protonix- GERD Remeron- sleep Effexor XR- MDD     Type of Medication Issue Identified Description of Issue Recommendation(s)  Drug Interaction(s) (clinically significant)     Duplicate Therapy     Allergy     No Medication Administration End Date     Incorrect Dose     Additional Drug Therapy Needed     Significant med changes from prior encounter (inform family/care partners about these prior to discharge).    Other       Clinically significant medication issues were identified that warrant physician communication and completion of prescribed/recommended actions by midnight of the next day:  No  Time spent performing this drug regimen review (minutes):  30   Jolie Strohecker BS, PharmD, BCPS Clinical Pharmacist 03/24/2022 7:11 AM  Contact: [phone] after 3 PM  "Be curious, not judgmental..." -Jamal Maes

## 2022-03-23 NOTE — Progress Notes (Signed)
Inpatient Rehab Admissions Coordinator:  There is a bed available for pt in CIR today. Dr. Loleta Books is aware and in agreement. Pt, pt's daughter [name], NSG, TOC made aware.   Gayland Curry, [place], [place] Admissions Coordinator [phone]

## 2022-03-23 NOTE — Plan of Care (Signed)
  Problem: Education: Goal: Knowledge of disease or condition will improve 03/23/2022 1636 by Trixie Deis, RN Outcome: Adequate for Discharge 03/23/2022 [phone] by Trixie Deis, RN Outcome: Progressing Goal: Knowledge of secondary prevention will improve (SELECT ALL) Outcome: Adequate for Discharge Goal: Knowledge of patient specific risk factors will improve (INDIVIDUALIZE FOR PATIENT) 03/23/2022 1636 by Trixie Deis, RN Outcome: Adequate for Discharge 03/23/2022 0835 by Trixie Deis, RN Outcome: Progressing Goal: Individualized Educational Video(s) Outcome: Adequate for Discharge   Problem: Coping: Goal: Will verbalize positive feelings about self Outcome: Adequate for Discharge Goal: Will identify appropriate support needs Outcome: Adequate for Discharge   Problem: Health Behavior/Discharge Planning: Goal: Ability to manage health-related needs will improve Outcome: Adequate for Discharge   Problem: Self-Care: Goal: Ability to participate in self-care as condition permits will improve Outcome: Adequate for Discharge Goal: Verbalization of feelings and concerns over difficulty with self-care will improve Outcome: Adequate for Discharge Goal: Ability to communicate needs accurately will improve Outcome: Adequate for Discharge   Problem: Nutrition: Goal: Risk of aspiration will decrease Outcome: Adequate for Discharge Goal: Dietary intake will improve Outcome: Adequate for Discharge   Problem: Intracerebral Hemorrhage Tissue Perfusion: Goal: Complications of Intracerebral Hemorrhage will be minimized Outcome: Adequate for Discharge   Problem: Ischemic Stroke/TIA Tissue Perfusion: Goal: Complications of ischemic stroke/TIA will be minimized Outcome: Adequate for Discharge   Problem: Spontaneous Subarachnoid Hemorrhage Tissue Perfusion: Goal: Complications of Spontaneous Subarachnoid Hemorrhage will be minimized Outcome: Adequate for Discharge   Problem:  Education: Goal: Knowledge of General Education information will improve Description: Including pain rating scale, medication(s)/side effects and non-pharmacologic comfort measures Outcome: Adequate for Discharge   Problem: Health Behavior/Discharge Planning: Goal: Ability to manage health-related needs will improve 03/23/2022 1636 by Trixie Deis, RN Outcome: Adequate for Discharge 03/23/2022 0835 by Trixie Deis, RN Outcome: Progressing   Problem: Clinical Measurements: Goal: Ability to maintain clinical measurements within normal limits will improve Outcome: Adequate for Discharge Goal: Will remain free from infection Outcome: Adequate for Discharge Goal: Diagnostic test results will improve Outcome: Adequate for Discharge Goal: Respiratory complications will improve Outcome: Adequate for Discharge Goal: Cardiovascular complication will be avoided Outcome: Adequate for Discharge   Problem: Activity: Goal: Risk for activity intolerance will decrease 03/23/2022 1636 by Trixie Deis, RN Outcome: Adequate for Discharge 03/23/2022 [phone] by Trixie Deis, RN Outcome: Progressing   Problem: Nutrition: Goal: Adequate nutrition will be maintained Outcome: Adequate for Discharge   Problem: Coping: Goal: Level of anxiety will decrease Outcome: Adequate for Discharge   Problem: Elimination: Goal: Will not experience complications related to bowel motility Outcome: Adequate for Discharge Goal: Will not experience complications related to urinary retention Outcome: Adequate for Discharge   Problem: Pain Managment: Goal: General experience of comfort will improve 03/23/2022 1636 by Trixie Deis, RN Outcome: Adequate for Discharge 03/23/2022 0835 by Trixie Deis, RN Outcome: Progressing   Problem: Safety: Goal: Ability to remain free from injury will improve 03/23/2022 1636 by Trixie Deis, RN Outcome: Adequate for Discharge 03/23/2022 0835 by Trixie Deis, RN Outcome: Progressing   Problem: Skin Integrity: Goal: Risk for impaired skin integrity will decrease 03/23/2022 1636 by Trixie Deis, RN Outcome: Adequate for Discharge 03/23/2022 0835 by Trixie Deis, RN Outcome: Progressing

## 2022-03-24 DIAGNOSIS — I6381 Other cerebral infarction due to occlusion or stenosis of small artery: Secondary | ICD-10-CM | POA: Diagnosis not present

## 2022-03-24 LAB — CBC WITH DIFFERENTIAL/PLATELET
Abs Immature Granulocytes: 0.01 10*3/uL (ref 0.00–0.07)
Basophils Absolute: 0 10*3/uL (ref 0.0–0.1)
Basophils Relative: 0 %
Eosinophils Absolute: 0.3 10*3/uL (ref 0.0–0.5)
Eosinophils Relative: 5 %
HCT: 38.5 % (ref 36.0–46.0)
Hemoglobin: 12.4 g/dL (ref 12.0–15.0)
Immature Granulocytes: 0 %
Lymphocytes Relative: 21 %
Lymphs Abs: 1.2 10*3/uL (ref 0.7–4.0)
MCH: 33.2 pg (ref 26.0–34.0)
MCHC: 32.2 g/dL (ref 30.0–36.0)
MCV: 102.9 fL — ABNORMAL HIGH (ref 80.0–100.0)
Monocytes Absolute: 0.4 10*3/uL (ref 0.1–1.0)
Monocytes Relative: 7 %
Neutro Abs: 3.7 10*3/uL (ref 1.7–7.7)
Neutrophils Relative %: 67 %
Platelets: 249 10*3/uL (ref 150–400)
RBC: 3.74 MIL/uL — ABNORMAL LOW (ref 3.87–5.11)
RDW: 13.9 % (ref 11.5–15.5)
WBC: 5.5 10*3/uL (ref 4.0–10.5)
nRBC: 0 % (ref 0.0–0.2)

## 2022-03-24 LAB — COMPREHENSIVE METABOLIC PANEL
ALT: 17 U/L (ref 0–44)
AST: 24 U/L (ref 15–41)
Albumin: 3.4 g/dL — ABNORMAL LOW (ref 3.5–5.0)
Alkaline Phosphatase: 62 U/L (ref 38–126)
Anion gap: 7 (ref 5–15)
BUN: 6 mg/dL — ABNORMAL LOW (ref 8–23)
CO2: 28 mmol/L (ref 22–32)
Calcium: 8.9 mg/dL (ref 8.9–10.3)
Chloride: 101 mmol/L (ref 98–111)
Creatinine, Ser: 0.87 mg/dL (ref 0.44–1.00)
GFR, Estimated: >60 mL/min (ref >60)
Glucose, Bld: 150 mg/dL — ABNORMAL HIGH (ref 70–99)
Potassium: 3.1 mmol/L — ABNORMAL LOW (ref 3.5–5.1)
Sodium: 136 mmol/L (ref 135–145)
Total Bilirubin: 0.6 mg/dL (ref 0.3–1.2)
Total Protein: 6.5 g/dL (ref 6.5–8.1)

## 2022-03-24 MED ORDER — AMLODIPINE BESYLATE 5 MG PO TABS
5.0000 mg | ORAL_TABLET | Freq: Every day | ORAL | Status: DC
  Administered 2022-03-24 – 2022-03-27 (×4): 5 mg via ORAL
  Filled 2022-03-24 (×4): qty 1

## 2022-03-24 MED ORDER — DICLOFENAC SODIUM 1 % EX GEL
2.0000 g | Freq: Four times a day (QID) | CUTANEOUS | Status: DC | PRN
  Filled 2022-03-24: qty 100

## 2022-03-24 NOTE — Progress Notes (Signed)
Inpatient Rehabilitation Care Coordinator Assessment and Plan Patient Details  Name: Jessica [name] MRN: [id number] Date of Birth: [birth year]  Today's Date: 03/24/2022  Hospital Problems: Principal Problem:   Left thalamic infarction [facility])  Past Medical History:  Past Medical History:  Diagnosis Date   Allergy    Anemia    Anxiety    Asthma    in past, no inhalers now   Blood transfusion without reported diagnosis    Breast cancer ([place]) 01/2017   left breast    Breast cancer of upper-inner quadrant of left female breast ([place]) 03/11/2017   Cancer ([place]) 01/2017   left breast   Cataract    bilateral removed   Chronic kidney disease    kidney stones   Depression 08/31/2013   Dyslipidemia, goal LDL below 160 08/31/2013   Essential hypertension - well controlled 08/31/2013   Family history of breast cancer    Family history of melanoma    Family history of prostate cancer    GERD (gastroesophageal reflux disease)    Goals of care, counseling/discussion 03/11/2017   History of radiation therapy 10/13/17-11/11/17   left breast 40.05 Gy in 15 fractions, left breast boost 10 Gy in 5 fractions   Hypothyroidism    Obesity (BMI 30-39.9) 08/31/2013   Personal history of chemotherapy 2018   Personal history of radiation therapy 2019   Thyroid disease    Vasovagal near-syncope -rare 08/31/2013   Past Surgical History:  Past Surgical History:  Procedure Laterality Date   ABDOMINAL HYSTERECTOMY     total   BREAST BIOPSY Left 02/24/2017    malignant   BREAST LUMPECTOMY Left 03/23/2017   broken fingers     CATARACT EXTRACTION Bilateral    COLONOSCOPY     EXCISIONAL HEMORRHOIDECTOMY     IR FLUORO GUIDE PORT INSERTION RIGHT  08/04/2017   IR REMOVAL TUN ACCESS W/ PORT W/O FL MOD SED  08/04/2017   IR US GUIDE VASC ACCESS RIGHT  08/04/2017   IRRIGATION AND DEBRIDEMENT ABSCESS Left 05/18/2017   Procedure: IRRIGATION AND DEBRIDEMENT LEFT AXILLARY ABSCESS;  Surgeon: Michael Boston, MD;   Location: [facility];  Service: General;  Laterality: Left;   kidney stones lithotripsy     PORTACATH PLACEMENT Right 03/23/2017   Procedure: INSERTION PORT-A-CATH;  Surgeon: Erroll Luna, MD;  Location: [facility];  Service: General;  Laterality: Right;   RADIOACTIVE SEED GUIDED PARTIAL MASTECTOMY WITH AXILLARY SENTINEL LYMPH NODE BIOPSY Left 03/23/2017   Procedure: LEFT BREAST RADIOACTIVE SEED GUIDED PARTIAL MASTECTOMY AND  SENTINEL LYMPH NODE [place];  Surgeon: Erroll Luna, MD;  Location: [facility];  Service: General;  Laterality: Left;   ROTATOR CUFF REPAIR     left    TONSILLECTOMY     WISDOM TOOTH EXTRACTION     Social History:  reports that she has never smoked. She has never used smokeless tobacco. She reports that she does not drink alcohol and does not use drugs.  Family / Support Systems Marital Status: Widow/Widower Patient Roles: Parent, Volunteer Children: Karen-daughter [phone]  Sheree-daughter [phone] Jennifer-daughter [phone] Other Supports: Theodoro Kos members and friends Anticipated Caregiver: All three daughter's [name] to take FMLA and Social research officer, government and Bedelia Person can work from home Ability/Limitations of Caregiver: none Caregiver Availability: 24/7 Family Dynamics: Close knit with all three daughter's anmd extended family. Pt has friends and church members who are involved and will come by ans check on her  Social History Preferred language: English Religion: Baptist Cultural Background: no issues Education:  [facility] Literacy - How often do you need to have someone help you when you read instructions, pamphlets, or other written material from your doctor or pharmacy?: Never Writes: Yes Employment Status: Retired Public relations account executive Issues: No issues Guardian/Conservator: None-according to MD pt is capable of making her own decisions while here   Abuse/Neglect Abuse/Neglect Assessment Can Be Completed: Yes Physical Abuse:  Denies Verbal Abuse: Denies Sexual Abuse: Denies Exploitation of patient/patient's resources: Denies Self-Neglect: Denies  Patient response to: Social Isolation - How often do you feel lonely or isolated from those around you?: Rarely  Emotional Status [name] affect, behavior and adjustment status: Pt is somewhat anxious this is too much for her, discussed rest breaks and team will work with her on her level. She has always been independent and taken care of herself and helped others. She is not used to this role Recent Psychosocial Issues: other health issues-past hx of breast cancer and has a bad left shoulder needs to be careful with Psychiatric History: history of depresion-anxiety takes medications for this and may benefit from seeing neuro-psych while here Substance Abuse History: no issues  Patient / Family Perceptions, Expectations & Goals Pt/Family understanding of illness & functional limitations: Pt and daughter-karen can explain her stroke and issues, daughter's are here daily with pt and ask questions and are very involved in her care. 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."  [employer]: None Premorbid Home Care/DME Agencies: None Transportation available at discharge: self, daughter's to assist now Is the patient able to respond to transportation needs?: Yes In the past 12 months, has lack of transportation kept you from medical appointments or from getting medications?: No In the past 12 months, has lack of transportation kept you from meetings, work, or from getting things needed for daily living?:  No Resource referrals recommended: Neuropsychology  Discharge Planning Living Arrangements: Alone Support Systems: Children, Other relatives, Friends/neighbors, Social worker community Type of Residence: Private residence Insurance Resources: Multimedia programmer (specify) Primary school teacher) Financial Resources: Radio broadcast assistant Screen Referred: No Living Expenses: Own Money Management: Patient Does the patient have any problems obtaining your medications?: No Home Management: self Patient/Family Preliminary Plans: Plan now is to go to [facility] which is more accessible and have all three daughter's assist her. [name] to take FMLA and other two daughter's can work from home. Being evaluated today and goals being set. Care Coordinator Barriers to Discharge: Insurance for SNF coverage Care Coordinator Anticipated Follow Up Needs: HH/OP  Clinical Impression Pleasant female who allows daughter who is present to answer for her. She is anxious regarding being able to tolerate the rehab program, hopefully after today she will feel better about this. Her three daughter's are very involved and will assist her at discharge. Aware she will need 24/7 care at first and will await therapy evaluations and work on discharge needs.  Elease Hashimoto 03/24/2022, 10:15 AM

## 2022-03-24 NOTE — Progress Notes (Signed)
Inpatient Rehabilitation  Patient information reviewed and entered into eRehab system by Adaya Garmany M. Taygen Acklin, M.A., CCC/SLP, PPS Coordinator.  Information including medical coding, functional ability and quality indicators will be reviewed and updated through discharge.    

## 2022-03-24 NOTE — Progress Notes (Addendum)
Patient ID: Jessica [name], female   DOB: [birth year], 81 y.o.   MRN: [id number] Met with the patient and daughter Jessica [name]) to review rehab process, team conference and plan of care. Discussed secondary risk management including HTN (md to adjust meds) HLD (LDL 216/Trig 161) and dietary modification recommendations. Reviewed DAPT x 3 mths (ASA+ Plavix) then ASA solo per neurology.  Dtr concerned about elevated BP; MD to adjust po meds and monitor. Reviewed use of my chart account for updates and access to MD/PAC.  Patient noted left shoulder is sore; reviewed pain control measures. Nurse to add restricted arm bands; BP check in leg. Continue to follow along to discharge to address educational needs to facilitate preparation for discharge. [name] B 1200; concern regarding speech and swallowing/choking at times. Requested SLP eval.

## 2022-03-24 NOTE — Progress Notes (Signed)
Occupational Therapy Session Note  Patient Details  Name: Jessica [name] MRN: [id number] Date of Birth: January 14, 1941  Today's Date: 03/24/2022 OT Individual Time: 1430-1530 OT Individual Time Calculation (min): 60 min    Short Term Goals: Week 1:  OT Short Term Goal 1 (Week 1): Patient will stand with no more than single UE support and intermittent min assistance for LB dressing and/or toileting OT Short Term Goal 2 (Week 1): Patient will demonstrate sufficient range of motion and activity tolerance to use BUE to shampoo her hair in the shower OT Short Term Goal 3 (Week 1): Patient will dress herself (excluding bra) with min assistance OT Short Term Goal 4 (Week 1): Patient will complete toilet hygiene with min assistance  Skilled Therapeutic Interventions/Progress Updates:  S: Pt reports that she is tired from therapy today but is agreeable to participate in therapy session.  O:  - Bed mobility: Min A supine to sit, sit to supine; bed railing used. - Sit to stand: contact guard using RW, VC for form and technique - Standing balance: Completed card sorting activity, 2 trials, with contact guard assist. 1st trial: 4'; 2nd trial: 7'35" - Toileting: Mod assist (managing clothing) - Toilet transfer: Min assist using elevated toilet seat. - Strengthening: seated, BUE shoulder extension, elbow flexion/extension, cross body curl, 10X  A: Patient required increased rest breaks due to fatigue. Encouraged functional conversation during session as patient reports that her speech is the most effected. No posterior leaning noticed initially during standing card sorting task although patient was relaying on bed to maintain balance and did not move out of her small base of support. Extended reaching with bilateral arms was encouraged during card sort. Patient demonstrated more difficulty maintaining standing balance during functional task of toilet hygiene and required moderate assistance to maintain  balance. VC for form and technique were provided during session for functional tasks and standing balance.    P: Lower BSC that is over toilet. It was too high during toileting. Work on reaching outside base of support when sitting then progress to standing to decrease risk of falling backwards.    Therapy Documentation Precautions:  Precautions Precautions: Fall Restrictions Weight Bearing Restrictions: No   Pain: Pain Assessment Pain Scale: 0-10 Pain Score: 0-No pain   Therapy/Group: Individual Therapy  Ailene Ravel, OTR/L,CBIS  Supplemental OT - [place] and [facility]  03/24/2022, 3:32 PM

## 2022-03-24 NOTE — Plan of Care (Signed)
  Problem: RH Balance Goal: LTG: Patient will maintain dynamic sitting balance (OT) Description: LTG:  Patient will maintain dynamic sitting balance with assistance during activities of daily living (OT) Flowsheets (Taken 03/24/2022 1535) LTG: Pt will maintain dynamic sitting balance during ADLs with: Independent Goal: LTG Patient will maintain dynamic standing with ADLs (OT) Description: LTG:  Patient will maintain dynamic standing balance with assist during activities of daily living (OT)  Flowsheets (Taken 03/24/2022 1535) LTG: Pt will maintain dynamic standing balance during ADLs with: Independent with assistive device

## 2022-03-24 NOTE — Evaluation (Signed)
Physical Therapy Assessment and Plan  Patient Details  Name: Jessica [name] MRN: [id number] Date of Birth: 1941/02/13  PT Diagnosis: Abnormal posture, Abnormality of gait, Cognitive deficits, Difficulty walking, Hemiparesis dominant, Impaired cognition, and Impaired sensation Rehab Potential: Good ELOS: 7-9 days   Today's Date: 03/24/2022 PT Individual Time: 1045-1200 PT Individual Time Calculation (min): 75 min    Hospital Problem: Principal Problem:   Left thalamic infarction [facility])   Past Medical History:  Past Medical History:  Diagnosis Date   Allergy    Anemia    Anxiety    Asthma    in past, no inhalers now   Blood transfusion without reported diagnosis    Breast cancer ([place]) 01/2017   left breast    Breast cancer of upper-inner quadrant of left female breast ([place]) 03/11/2017   Cancer ([place]) 01/2017   left breast   Cataract    bilateral removed   Chronic kidney disease    kidney stones   Depression 08/31/2013   Dyslipidemia, goal LDL below 160 08/31/2013   Essential hypertension - well controlled 08/31/2013   Family history of breast cancer    Family history of melanoma    Family history of prostate cancer    GERD (gastroesophageal reflux disease)    Goals of care, counseling/discussion 03/11/2017   History of radiation therapy 10/13/17-11/11/17   left breast 40.05 Gy in 15 fractions, left breast boost 10 Gy in 5 fractions   Hypothyroidism    Obesity (BMI 30-39.9) 08/31/2013   Personal history of chemotherapy 2018   Personal history of radiation therapy 2019   Thyroid disease    Vasovagal near-syncope -rare 08/31/2013   Past Surgical History:  Past Surgical History:  Procedure Laterality Date   ABDOMINAL HYSTERECTOMY     total   BREAST BIOPSY Left 02/24/2017    malignant   BREAST LUMPECTOMY Left 03/23/2017   broken fingers     CATARACT EXTRACTION Bilateral    COLONOSCOPY     EXCISIONAL HEMORRHOIDECTOMY     IR FLUORO GUIDE PORT INSERTION RIGHT   08/04/2017   IR REMOVAL TUN ACCESS W/ PORT W/O FL MOD SED  08/04/2017   IR US GUIDE VASC ACCESS RIGHT  08/04/2017   IRRIGATION AND DEBRIDEMENT ABSCESS Left 05/18/2017   Procedure: IRRIGATION AND DEBRIDEMENT LEFT AXILLARY ABSCESS;  Surgeon: Michael Boston, MD;  Location: [facility];  Service: General;  Laterality: Left;   kidney stones lithotripsy     PORTACATH PLACEMENT Right 03/23/2017   Procedure: INSERTION PORT-A-CATH;  Surgeon: Erroll Luna, MD;  Location: [place];  Service: General;  Laterality: Right;   RADIOACTIVE SEED GUIDED PARTIAL MASTECTOMY WITH AXILLARY SENTINEL LYMPH NODE BIOPSY Left 03/23/2017   Procedure: LEFT BREAST RADIOACTIVE SEED GUIDED PARTIAL MASTECTOMY AND  SENTINEL LYMPH NODE [place];  Surgeon: Erroll Luna, MD;  Location: [place];  Service: General;  Laterality: Left;   ROTATOR CUFF REPAIR     left    TONSILLECTOMY     WISDOM TOOTH EXTRACTION      Assessment & Plan Clinical Impression: Patient is a 81 year old right-handed female with history of hypertension, hyperlipidemia, hypothyroidism, asthma, depression/anxiety, left breast cancer status post partial mastectomy 2018 as well as radiation therapy, obesity with BMI 31.89.  Per chart review patient lives alone.  1 level home one-step to entry.  Independent prior to admission.  Presented 03/20/2022 with right-sided weakness slurred speech and altered mental status with right facial droop of acute onset.  CT/MRI showed small focus  of acute ischemia within the left caudate body.  No hemorrhage or mass effect.  Old right basal ganglia small vessel infarct and findings of chronic microvascular disease.  Patient did not receive tPA.  CT angiogram head and neck no emergent large vessel occlusion.  Echocardiogram ejection fraction of 60 to 65% no wall motion abnormalities.  Admission chemistries unremarkable except potassium 3.3, ammonia level within normal limits, urine drug screen negative.  Presently  maintained on low-dose aspirin as well as Plavix 75 mg daily for CVA prophylaxis x3 months then aspirin alone.  Lovenox for DVT prophylaxis.  Therapy evaluations completed due to patient decreased functional mobility was admitted for a comprehensive rehab program. Patient transferred to CIR on 03/23/2022 .   Patient currently requires min with mobility secondary to muscle weakness, decreased cardiorespiratoy endurance, motor apraxia, decreased motor planning, decreased initiation, decreased attention, decreased awareness, decreased problem solving, and delayed processing, and decreased sitting balance, decreased standing balance, decreased postural control, hemiplegia, and decreased balance strategies.  Prior to hospitalization, patient was independent  with mobility and lived with Alone in a House home.  Home access is 1 in front, 1 in garageStairs to enter.  Patient will benefit from skilled PT intervention to maximize safe functional mobility, minimize fall risk, and decrease caregiver burden for planned discharge home with 24 hour supervision.  Anticipate patient will benefit from follow up [place] at discharge.  PT - End of Session Activity Tolerance: Tolerates < 10 min activity with changes in vital signs Endurance Deficit: Yes Endurance Deficit Description: Seated rest break b/w functional mobility tasks PT Assessment Rehab Potential (ACUTE/IP ONLY): Good PT Barriers to Discharge: Home environment access/layout;Decreased caregiver support;Lack of/limited family support;Insurance for SNF coverage PT Patient demonstrates impairments in the following area(s): Balance;Motor;Safety;Endurance;Perception;Pain;Sensory PT Transfers Functional Problem(s): Bed Mobility;Bed to Chair;Car PT Locomotion Functional Problem(s): Ambulation;Stairs PT Plan PT Intensity: Minimum of 1-2 x/day ,45 to 90 minutes PT Frequency: 5 out of 7 days PT Duration Estimated Length of Stay: 7-9 days PT Treatment/Interventions:  Ambulation/gait training;Balance/vestibular training;Cognitive remediation/compensation;Community reintegration;Discharge planning;Disease management/prevention;DME/adaptive equipment instruction;Functional mobility training;Neuromuscular re-education;Pain management;Patient/family education;Psychosocial support;Skin care/wound management;Splinting/orthotics;Stair training;Therapeutic Activities;Therapeutic Exercise;UE/LE Strength taining/ROM;UE/LE Coordination activities;Visual/perceptual remediation/compensation;Wheelchair propulsion/positioning PT Transfers Anticipated Outcome(s): Supervision with LRAD PT Locomotion Anticipated Outcome(s): Supervision with LRAD PT Recommendation Recommendations for Other Services: Speech consult Follow Up Recommendations: Home health PT;24 hour supervision/assistance Patient destination: Home Equipment Recommended: To be determined   PT Evaluation Precautions/Restrictions Precautions Precautions: Fall Restrictions Weight Bearing Restrictions: No Pain Pain Assessment Pain Scale: 0-10 Pain Score: 0-No pain Pain Interference Pain Interference Pain Effect on Sleep: 3. Frequently Pain Interference with Therapy Activities: 1. Rarely or not at all Pain Interference with Day-to-Day Activities: 3. Frequently Home Living/Prior Functioning Home Living Living Arrangements: Alone Available Help at Discharge: Family;Available 24 hours/day Type of Home: House Home Access: Stairs to enter [employer] of Steps: 1 in front, 1 in garage Entrance Stairs-Rails: None Home Layout: One level Bathroom Shower/Tub: Multimedia programmer: Handicapped height Additional Comments: Planning to DC to daughter's home which is similar setup other than having tub/shower instead of walk-in shower  Lives With: Alone Prior Function Level of Independence: Independent with gait;Independent with basic ADLs;Independent with transfers;Independent with homemaking  with ambulation  Able to Take Stairs?: Yes Driving: Yes Vocation: Retired Leisure: Hobbies-yes (Comment) (Shopping at [facility]) Vision/Perception  Vision - History Ability to See in Adequate Light: 0 Adequate Perception Perception: Within Functional Limits Praxis Praxis: Impaired Praxis Impairment Details: Motor planning;Initiation  Cognition Overall Cognitive Status: Impaired/Different  from baseline Arousal/Alertness: Awake/alert Orientation Level: Oriented X4 Attention: Focused Focused Attention: Appears intact Memory: Impaired Memory Impairment: Decreased recall of new information Awareness: Impaired Awareness Impairment: Emergent impairment Problem Solving: Impaired Problem Solving Impairment: Functional basic Safety/Judgment: Appears intact Sensation Sensation Light Touch: Impaired by gross assessment Hot/Cold: Appears Intact Proprioception: Impaired by gross assessment Stereognosis: Not tested Coordination Gross Motor Movements are Fluid and Coordinated: No Coordination and Movement Description: Limited by mild R hemi and generalized weakness/deconditioning Finger Nose Finger Test: No overt dysmetria noted Heel Shin Test: Limited by hip flexor weakness Motor  Motor Motor: Hemiplegia Motor - Skilled Clinical Observations: mild R hemi, posterior lean   Trunk/Postural Assessment  Cervical Assessment Cervical Assessment: Exceptions to [facility] (forward head) Thoracic Assessment Thoracic Assessment: Exceptions to [facility] (rounded shoulders) Lumbar Assessment Lumbar Assessment: Exceptions to [facility] (posterior pelvic tilt) Postural Control Postural Control: Deficits on evaluation Righting Reactions: Delayed Postural Limitations: Posterior lean  Balance Balance Balance Assessed: Yes Standardized Balance Assessment Standardized Balance Assessment: Timed Up and Go Test Timed Up and Go Test TUG: Normal TUG Normal TUG (seconds): 100 (with RW) Static Sitting  Balance Static Sitting - Balance Support: No upper extremity supported;Feet supported Static Sitting - Level of Assistance: 5: Stand by assistance Dynamic Sitting Balance Dynamic Sitting - Balance Support: No upper extremity supported;Feet supported;During functional activity Dynamic Sitting - Level of Assistance: 4: Min assist Static Standing Balance Static Standing - Balance Support: No upper extremity supported Static Standing - Level of Assistance: 4: Min assist Dynamic Standing Balance Dynamic Standing - Balance Support: No upper extremity supported;During functional activity Dynamic Standing - Level of Assistance: 3: Mod assist Extremity Assessment      RLE Assessment RLE Assessment: Exceptions to [facility] RLE Strength RLE Overall Strength: Deficits Right Hip Flexion: 2+/5 Right Hip ABduction: 3+/5 Right Hip ADduction: 3+/5 Right Knee Flexion: 3+/5 Right Knee Extension: 4-/5 Right Ankle Dorsiflexion: 4-/5 LLE Assessment LLE Assessment: Exceptions to WFL LLE Strength LLE Overall Strength: Deficits Left Hip Flexion: 3+/5 Left Hip ABduction: 4-/5 Left Hip ADduction: 4-/5 Left Knee Flexion: 4-/5 Left Knee Extension: 4-/5 Left Ankle Dorsiflexion: 4-/5  Care Tool Care Tool Bed Mobility Roll left and right activity   Roll left and right assist level: Minimal Assistance - Patient > 75%    Sit to lying activity   Sit to lying assist level: Minimal Assistance - Patient > 75%    Lying to sitting on side of bed activity   Lying to sitting on side of bed assist level: the ability to move from lying on the back to sitting on the side of the bed with no back support.: Minimal Assistance - Patient > 75%     Care Tool Transfers Sit to stand transfer   Sit to stand assist level: Minimal Assistance - Patient > 75%    Chair/bed transfer   Chair/bed transfer assist level: Minimal Assistance - Patient > 75%     Physiological scientist transfer assist level:  Minimal Assistance - Patient > 75%      Care Tool Locomotion Ambulation   Assist level: Minimal Assistance - Patient > 75% Assistive device: Walker-rolling Max distance: 7ft  Walk 10 feet activity   Assist level: Minimal Assistance - Patient > 75% Assistive device: Walker-rolling   Walk 50 feet with 2 turns activity Walk 50 feet with 2 turns activity did not occur: Safety/medical concerns (fatigue)      Walk 150 feet activity  Walk 150 feet activity did not occur: Safety/medical concerns      Walk 10 feet on uneven surfaces activity Walk 10 feet on uneven surfaces activity did not occur: Safety/medical concerns      Stairs Stair activity did not occur: Safety/medical concerns        Walk up/down 1 step activity Walk up/down 1 step or curb (drop down) activity did not occur: Safety/medical concerns      Walk up/down 4 steps activity Walk up/down 4 steps activity did not occur: Safety/medical concerns      Walk up/down 12 steps activity Walk up/down 12 steps activity did not occur: Safety/medical concerns      Pick up small objects from floor Pick up small object from the floor (from standing position) activity did not occur: Safety/medical concerns      Wheelchair Is the patient using a wheelchair?: Yes Type of Wheelchair: Manual   Wheelchair assist level: Dependent - Patient 0%    Wheel 50 feet with 2 turns activity   Assist Level: Dependent - Patient 0%  Wheel 150 feet activity   Assist Level: Dependent - Patient 0%    Refer to Care Plan for Long Term Goals  SHORT TERM GOAL WEEK 1 PT Short Term Goal 1 (Week 1): STG = LTG due to ELOS  Recommendations for other services: Neuropsych  Skilled Therapeutic Intervention Mobility Transfers Transfers: Sit to Stand;Stand to [employer] Transfers Sit to Stand: Minimal Assistance - Patient > 75% Stand to Sit: Minimal Assistance - Patient > 75% Stand Pivot Transfers: Minimal Assistance - Patient > 75% Stand Pivot  Transfer Details: Verbal cues for gait pattern;Verbal cues for sequencing;Verbal cues for technique;Verbal cues for precautions/safety;Tactile cues for posture;Visual cues for safe use of DME/AE;Tactile cues for sequencing;Manual facilitation for weight shifting Transfer (Assistive device): Rolling walker Locomotion  Gait Ambulation: Yes Gait Assistance: Minimal Assistance - Patient > 75% Gait Distance (Feet): 45 Feet Assistive device: Rolling walker Gait Assistance Details: Verbal cues for safe use of DME/AE;Verbal cues for gait pattern;Verbal cues for sequencing;Visual cues/gestures for sequencing;Verbal cues for technique;Verbal cues for precautions/safety;Tactile cues for posture;Tactile cues for weight shifting;Visual cues for safe use of DME/AE;Tactile cues for initiation;Tactile cues for sequencing Gait Gait: Yes Gait Pattern: Impaired Gait Pattern: Step-through pattern;Decreased stride length;Trunk flexed;Poor foot clearance - right;Poor foot clearance - left Gait velocity: reduced  Skilled Intervention: Pt greeted seated in w/c with her daughter at bedside. Pt agreeable to PT evaluation. Daughter assisted with PLOF and social factors. Daughter reports the tentative plan is for patient to DC to another sister's home where 24/7 S/A will be provided - home is 1 lvl with 1 STE. Pt with delayed processing and slow to initiate, strength mildly weaker on R side. She denies visual changes since CVA but will need further assessment in functional context.   Instructed pt in results of PT evaluation as detailed above, PT POC, rehab potential, rehab goals, and discharge recommendations. Additionally discussed CIR's policies regarding fall safety and use of chair alarm and/or quick release belt. Pt verbalized understanding and in agreement.   Transported pt in w/c to main rehab gym. BP assessed in R lower leg due to restricted extremities in both UE from hx of breast CA. BP elevated, 185/89 - RN  notified via secure chat.   Gait in // bars ~53ft with minA with BUE support - minA for steadying due to posterior bias, very slow gait speed with decreased stride length and narrow BOS. Progressed gait using  RW - able to ambulate 4ft with minA, similar gait deficits but improved gait speed compared to // bars. No overt knee buckling or LOB noted.   Able to complete car transfer with car height simulating mid-size SUV - required minA for BLE management only and mod instructional cues for sequencing.  Returned to room and she concluded session seated in w/c with daughter at bedside. RN also present for BP medications. Updated family on pt's mobility during session. All needs met with handoff of care.    Discharge Criteria: Patient will be discharged from PT if patient refuses treatment 3 consecutive times without medical reason, if treatment goals not met, if there is a change in medical status, if patient makes no progress towards goals or if patient is discharged from hospital.  The above assessment, treatment plan, treatment alternatives and goals were discussed and mutually agreed upon: by patient and by family  Jone Baseman Harshal Sirmon PT 03/24/2022, 12:07 PM

## 2022-03-24 NOTE — Progress Notes (Signed)
Inpatient [place] Individual Statement of Services  Patient Name:  Jessica [name]  Date:  03/24/2022  Welcome to the [facility].  Our goal is to provide you with an individualized program based on your diagnosis and situation, designed to meet your specific needs.  With this comprehensive rehabilitation program, you will be expected to participate in at least 3 hours of rehabilitation therapies Monday-Friday, with modified therapy programming on the weekends.  Your rehabilitation program will include the following services:  Physical Therapy (PT), Occupational Therapy (OT), Speech Therapy (ST), 24 hour per day rehabilitation nursing, Neuropsychology, Care Coordinator, Rehabilitation Medicine, Nutrition Services, and Pharmacy Services  Weekly team conferences will be held on Wednesday to discuss your progress.  Your Inpatient Rehabilitation Care Coordinator will talk with you frequently to get your input and to update you on team discussions.  Team conferences with you and your family in attendance may also be held.  Expected length of stay: 7- 10 days  Overall anticipated outcome: supervision level  Depending on your progress and recovery, your program may change. Your Inpatient Rehabilitation Care Coordinator will coordinate services and will keep you informed of any changes. Your Inpatient Rehabilitation Care Coordinator's name and contact numbers are listed  below.  The following services may also be recommended but are not provided by the [facility] will be made to provide these services after discharge if needed.  Arrangements include referral to agencies that provide these services.  Your insurance has been verified to be:  [employer]-Medicare Your primary doctor is:  [name]  Pertinent information will be shared with  your doctor and your insurance company.  Inpatient Rehabilitation Care Coordinator:  Ovidio Kin, [place] or Emilia Beck  Information discussed with and copy given to patient by: Elease Hashimoto, 03/24/2022, 10:17 AM

## 2022-03-24 NOTE — Evaluation (Signed)
Occupational Therapy Assessment and Plan  Patient Details  Name: Jessica [name] MRN: [id number] Date of Birth: 08/13/1941  OT Diagnosis: abnormal posture, acute pain, altered mental status, cognitive deficits, hemiplegia affecting dominant side, muscle weakness (generalized), and pain in joint Rehab Potential:   ELOS: 7-10 days   Today's Date: 03/24/2022 OT Individual Time: 8502-7741 OT Individual Time Calculation (min): 65 min     Hospital Problem: Principal Problem:   Left thalamic infarction [facility])   Past Medical History:  Past Medical History:  Diagnosis Date   Allergy    Anemia    Anxiety    Asthma    in past, no inhalers now   Blood transfusion without reported diagnosis    Breast cancer ([place]) 01/2017   left breast    Breast cancer of upper-inner quadrant of left female breast ([place]) 03/11/2017   Cancer ([place]) 01/2017   left breast   Cataract    bilateral removed   Chronic kidney disease    kidney stones   Depression 08/31/2013   Dyslipidemia, goal LDL below 160 08/31/2013   Essential hypertension - well controlled 08/31/2013   Family history of breast cancer    Family history of melanoma    Family history of prostate cancer    GERD (gastroesophageal reflux disease)    Goals of care, counseling/discussion 03/11/2017   History of radiation therapy 10/13/17-11/11/17   left breast 40.05 Gy in 15 fractions, left breast boost 10 Gy in 5 fractions   Hypothyroidism    Obesity (BMI 30-39.9) 08/31/2013   Personal history of chemotherapy 2018   Personal history of radiation therapy 2019   Thyroid disease    Vasovagal near-syncope -rare 08/31/2013   Past Surgical History:  Past Surgical History:  Procedure Laterality Date   ABDOMINAL HYSTERECTOMY     total   BREAST BIOPSY Left 02/24/2017    malignant   BREAST LUMPECTOMY Left 03/23/2017   broken fingers     CATARACT EXTRACTION Bilateral    COLONOSCOPY     EXCISIONAL HEMORRHOIDECTOMY     IR FLUORO GUIDE PORT  INSERTION RIGHT  08/04/2017   IR REMOVAL TUN ACCESS W/ PORT W/O FL MOD SED  08/04/2017   IR US GUIDE VASC ACCESS RIGHT  08/04/2017   IRRIGATION AND DEBRIDEMENT ABSCESS Left 05/18/2017   Procedure: IRRIGATION AND DEBRIDEMENT LEFT AXILLARY ABSCESS;  Surgeon: Michael Boston, MD;  Location: [facility];  Service: General;  Laterality: Left;   kidney stones lithotripsy     PORTACATH PLACEMENT Right 03/23/2017   Procedure: INSERTION PORT-A-CATH;  Surgeon: Erroll Luna, MD;  Location: [place];  Service: General;  Laterality: Right;   RADIOACTIVE SEED GUIDED PARTIAL MASTECTOMY WITH AXILLARY SENTINEL LYMPH NODE BIOPSY Left 03/23/2017   Procedure: LEFT BREAST RADIOACTIVE SEED GUIDED PARTIAL MASTECTOMY AND  SENTINEL LYMPH NODE [place];  Surgeon: Erroll Luna, MD;  Location: [place];  Service: General;  Laterality: Left;   ROTATOR CUFF REPAIR     left    TONSILLECTOMY     WISDOM TOOTH EXTRACTION      Assessment & Plan Clinical Impression: Patient is a 81 y.o. year old female with history of hypertension, hyperlipidemia, hypothyroidism, asthma, depression/anxiety, left breast cancer status post partial mastectomy 2018 as well as radiation therapy, obesity with BMI 31.89.  Patient lived alone.   And was independent prior to admission.  Presented 03/20/2022 with right-sided weakness slurred speech and altered mental status with right facial droop of acute onset.  CT/MRI showed small  focus of acute ischemia within the left caudate body.  Old right basal ganglia small vessel infarct and findings of chronic microvascular disease.  Patient did not receive tPA.  Echocardiogram ejection fraction of 60 to 65% no wall motion abnormalities.  Patient transferred to CIR on 03/23/2022 .    Patient currently requires mod with basic self-care skills secondary to decreased cardiorespiratoy endurance, impaired timing and sequencing, unbalanced muscle activation, decreased coordination, and decreased  motor planning, decreased midline orientation and decreased motor planning, and decreased initiation, decreased attention, decreased awareness, decreased safety awareness, decreased memory, and delayed processing.  Prior to hospitalization, patient could complete ADL/IADL with independent .  Patient will benefit from skilled intervention to decrease level of assist with basic self-care skills, increase independence with basic self-care skills, and increase level of independence with iADL prior to discharge home with care partner.  Anticipate patient will require 24 hour supervision and follow up outpatient.  OT - End of Session Activity Tolerance: Tolerates 10 - 20 min activity with multiple rests Endurance Deficit: Yes Endurance Deficit Description: Alternating between seated and stand positions during simple self care task OT Assessment OT Barriers to Discharge: Inaccessible home environment;Behavior OT Barriers to Discharge Comments: Appears to have excellent family support, question depression and anxiety, has stairs to enter home OT Patient demonstrates impairments in the following area(s): Balance;Pain;Behavior;Perception;Cognition;Safety;Endurance;Motor OT Basic ADL's Functional Problem(s): Grooming;Bathing;Dressing;Toileting;Eating OT Advanced ADL's Functional Problem(s): Simple Meal Preparation OT Transfers Functional Problem(s): Toilet;Tub/Shower OT Plan OT Intensity: Minimum of 1-2 x/day, 45 to 90 minutes OT Frequency: 5 out of 7 days OT Duration/Estimated Length of Stay: 7-10 days OT Treatment/Interventions: Balance/vestibular training;Discharge planning;Pain management;Self Care/advanced ADL retraining;Therapeutic Activities;UE/LE Coordination activities;Cognitive remediation/compensation;Functional mobility training;Patient/family education;Therapeutic Exercise;Visual/perceptual remediation/compensation;DME/adaptive equipment instruction;Neuromuscular re-education;UE/LE Strength  taining/ROM OT Self Feeding Anticipated Outcome(s): Indpependent OT Basic Self-Care Anticipated Outcome(s): set up assistance OT Toileting Anticipated Outcome(s): supervision OT Bathroom Transfers Anticipated Outcome(s): supervision OT Recommendation Patient destination: Home (with family support) Follow Up Recommendations: Outpatient OT Equipment Recommended: 3 in 1 bedside comode;Tub/shower seat;Tub/shower bench Equipment Details: will depend on d/c destination - daughter's home - tub transfer bench, patient's home - shower  seat   OT Evaluation Precautions/Restrictions  Precautions Precautions: Fall Restrictions Weight Bearing Restrictions: No General Chart Reviewed: Yes Additional Pertinent History: hypertension, hyperlipidemia, hypothyroidism, asthma, depression/anxiety, left breast cancer status post partial mastectomy 2018 as well as radiation therapy Family/Caregiver Present: Yes (eldest daughter) Vital Signs Therapy Vitals Temp: 97.6 F (36.4 C) Temp Source: Oral Pulse Rate: 78 Resp: 17 BP: (!) 183/65 Patient Position (if appropriate): Lying Oxygen Therapy SpO2: 97 % O2 Device: Room Air Pain Pain Assessment Pain Scale: 0-10 Pain Score: 0-No pain Home Living/Prior Functioning Home Living Family/patient expects to be discharged to:: Private residence Living Arrangements: Alone Available Help at Discharge: Family, Available 24 hours/day Type of Home: House Home Access: Stairs to enter [employer] of Steps: 1 in front, 1 in garage Entrance Stairs-Rails: None Home Layout: One level Bathroom Shower/Tub: Multimedia programmer: Handicapped height Bathroom Accessibility: Yes Additional Comments: Planning to DC to daughter's home which is similar setup other than having tub/shower instead of walk-in shower  Lives With: Alone Prior Function Level of Independence: Independent with gait, Independent with basic ADLs, Independent with transfers,  Independent with homemaking with ambulation  Able to Take Stairs?: Yes Driving: Yes Vocation: Retired Leisure: Hobbies-yes (Comment) (Shopping at [facility]) Vision Baseline Vision/History: 1 Wears glasses Ability to See in Adequate Light: 0 Adequate Patient Visual Report: No change from baseline  Vision Assessment?: Vision impaired- to be further tested in functional context Perception  Perception: Impaired Spatial Orientation: strong posterior bias Praxis Praxis: Impaired Praxis Impairment Details: Motor planning;Initiation Cognition Cognition Overall Cognitive Status: Impaired/Different from baseline Arousal/Alertness: Lethargic Orientation Level: Person;Place;Situation Person: Oriented Place: Oriented Situation: Oriented Memory: Impaired Memory Impairment: Decreased recall of new information Attention: Focused Focused Attention: Appears intact Awareness: Impaired Awareness Impairment: Emergent impairment Problem Solving: Impaired Problem Solving Impairment: Functional basic Executive Function: Organizing;Initiating;Sequencing Sequencing: Impaired Sequencing Impairment: Functional basic Organizing: Impaired Organizing Impairment: Functional basic Initiating: Impaired Initiating Impairment: Functional basic Behaviors: Other (comment) (flat affect/ anxious) Safety/Judgment: Impaired Brief Interview for Mental Status (BIMS) Repetition of Three Words (First Attempt): 3 Temporal Orientation: Year: Correct Temporal Orientation: Month: Accurate within 5 days Temporal Orientation: Day: Correct Recall: "Sock": Yes, no cue required Recall: "Blue": Yes, no cue required Recall: "Bed": No, could not recall BIMS Summary Score: 13 Sensation Sensation Light Touch: Impaired by gross assessment Hot/Cold: Not tested Proprioception: Impaired by gross assessment Stereognosis: Not tested Coordination Gross Motor Movements are Fluid and Coordinated: No Fine Motor Movements  are Fluid and Coordinated: No Coordination and Movement Description: Limited by mild R hemi and generalized weakness/deconditioning Finger Nose Finger Test: No overt dysmetria noted Heel Shin Test: Limited by hip flexor weakness Motor  Motor Motor: Hemiplegia;Abnormal postural alignment and control Motor - Skilled Clinical Observations: mild R hemi, posterior lean  Trunk/Postural Assessment  Cervical Assessment Cervical Assessment: Exceptions to [facility] (forward head posture) Thoracic Assessment Thoracic Assessment: Exceptions to [facility] (thoracici flex) Lumbar Assessment Lumbar Assessment: Exceptions to [facility] (posterior pelvis) Postural Control Postural Control: Deficits on evaluation Righting Reactions: Delayed Postural Limitations: Posterior lean  Balance Balance Balance Assessed: Yes Standardized Balance Assessment Standardized Balance Assessment: Timed Up and Go Test Timed Up and Go Test TUG: Normal TUG Normal TUG (seconds): 100 (with RW) Static Sitting Balance Static Sitting - Balance Support: No upper extremity supported;Feet supported Static Sitting - Level of Assistance: 4: Min assist Dynamic Sitting Balance Dynamic Sitting - Balance Support: No upper extremity supported;Feet supported;During functional activity Dynamic Sitting - Level of Assistance: 4: Min assist Dynamic Sitting Balance - Compensations: limited excursion forward Dynamic Sitting - Balance Activities: Lateral lean/weight shifting;Reaching for objects;Forward lean/weight shifting Static Standing Balance Static Standing - Balance Support: Bilateral upper extremity supported Static Standing - Level of Assistance: 4: Min assist Static Standing - Comment/# of Minutes: 1 min Dynamic Standing Balance Dynamic Standing - Balance Support: Bilateral upper extremity supported Dynamic Standing - Level of Assistance: 3: Mod assist Dynamic Standing - Balance Activities: Lateral lean/weight shifting;Forward lean/weight  shifting;Reaching for objects Extremity/Trunk Assessment RUE Assessment RUE Assessment: Exceptions to [facility] Active Range of Motion (AROM) Comments: Limited end range shoulder flex/abd General Strength Comments: Decreased sustained muscle activation LUE Assessment LUE Assessment: Exceptions to [facility] Active Range of Motion (AROM) Comments: Limited shoulder flex/ext - h/o breast cancer, painful and stiff left shoulder prior to this hospitalization General Strength Comments: deconditioned, NT due to pain  Care Tool Care Tool Self Care Eating   Eating Assist Level: Set up assist    Oral Care    Oral Care Assist Level: Moderate Assistance - Patient 50 - 74%    Bathing   Body parts bathed by patient: Right arm;Left arm;Chest;Abdomen;Right upper leg;Left upper leg;Face Body parts bathed by helper: Front perineal area;Buttocks;Right lower leg;Left lower leg   Assist Level: Moderate Assistance - Patient 50 - 74%    Upper Body Dressing(including orthotics)   What is the patient wearing?: Bra;Pull  over shirt   Assist Level: Maximal Assistance - Patient 25 - 49%    Lower Body Dressing (excluding footwear)   What is the patient wearing?: Underwear/pull up;Pants Assist for lower body dressing: Moderate Assistance - Patient 50 - 74%    Putting on/Taking off footwear   What is the patient wearing?: Socks;Shoes Assist for footwear: Moderate Assistance - Patient 50 - 74%       Care Tool Toileting Toileting activity   Assist for toileting: Moderate Assistance - Patient 50 - 74%     Care Tool Bed Mobility Roll left and right activity   Roll left and right assist level: Minimal Assistance - Patient > 75%    Sit to lying activity   Sit to lying assist level: Minimal Assistance - Patient > 75%    Lying to sitting on side of bed activity   Lying to sitting on side of bed assist level: the ability to move from lying on the back to sitting on the side of the bed with no back support.: Minimal  Assistance - Patient > 75%     Care Tool Transfers Sit to stand transfer   Sit to stand assist level: Minimal Assistance - Patient > 75%    Chair/bed transfer   Chair/bed transfer assist level: Minimal Assistance - Patient > 75%     Toilet transfer   Assist Level: Moderate Assistance - Patient 50 - 74%     Care Tool Cognition  Expression of Ideas and Wants Expression of Ideas and Wants: 3. Some difficulty - exhibits some difficulty with expressing needs and ideas (e.g, some words or finishing thoughts) or speech is not clear  Understanding Verbal and Non-Verbal Content Understanding Verbal and Non-Verbal Content: 3. Usually understands - understands most conversations, but misses some part/intent of message. Requires cues at times to understand   Memory/Recall Ability Memory/Recall Ability : Current season;That he or she is in a hospital/hospital unit   Refer to Care Plan for [place] 1 OT Short Term Goal 1 (Week 1): Patient will stand with no more than single UE support and intermittent min assistance for LB dressing and/or toileting OT Short Term Goal 2 (Week 1): Patient will demonstrate sufficient range of motion and activity tolerance to use BUE to shampoo her hair in the shower OT Short Term Goal 3 (Week 1): Patient will dress herself (excluding bra) with min assistance OT Short Term Goal 4 (Week 1): Patient will complete toilet hygiene with min assistance  Recommendations for other services: None    Skilled Therapeutic Intervention Patient received in reclined supine in bed.  Daughter at bedside.  Patient expressing to daughter that she is nervous about being able to tolerate rehab - reassured patient and daughter that program would be tailored to her needs, and we would move at her pace.  Skilled intervention to address/assess basic self care skills.  Patient taken to bathroom where she had continent void.  Nursing staff aware.  Patient with strong  posterior bias in standing, less obvious in sitting, although limited ability to lean forward to turn on faucet, light, reach toward feet.  Patient moves slowly - but as session progressed she needed less assistance for sit to stand and stand to sit transitions.  Patient left up in wheelchair with daughter at her side, next therapist scheduled in 30 minutes - call bell in lap.    ADL ADL Eating: Set up Where Assessed-Eating: Chair Grooming: Minimal assistance Where  Assessed-Grooming: Sitting at sink Upper Body Bathing: Minimal assistance Where Assessed-Upper Body Bathing: Sitting at sink Lower Body Bathing: Moderate assistance Where Assessed-Lower Body Bathing: Standing at sink Upper Body Dressing: Maximal assistance Where Assessed-Upper Body Dressing: Sitting at sink Lower Body Dressing: Moderate assistance Where Assessed-Lower Body Dressing: Sitting at sink;Standing at sink Toileting: Moderate assistance Where Assessed-Toileting: Glass blower/designer: Moderate assistance Toilet Transfer Method: Stand pivot;Ambulating Science writer: Raised toilet seat;Grab bars Tub/Shower Transfer: Not assessed (patient declined) Tub/Shower Equipment: Other (comment) (Will need to consider tub transfer bench  if d/c is to daughter's home) [employer]: Other (comment) (Would consider shower seat if d/c is to patient's home) ADL Comments: Patient anxious and affect flat on evaluation - overtly declined shower, but agrees to try tomorrow.  Will need port covered Mobility  Bed Mobility Bed Mobility: Rolling Right;Supine to Sit Rolling Right: Minimal Assistance - Patient > 75% Supine to Sit: Minimal Assistance - Patient > 75% Transfers Sit to Stand: Minimal Assistance - Patient > 75% Stand to Sit: Minimal Assistance - Patient > 75%   Discharge Criteria: Patient will be discharged from OT if patient refuses treatment 3 consecutive times without medical reason, if treatment  goals not met, if there is a change in medical status, if patient makes no progress towards goals or if patient is discharged from hospital.  The above assessment, treatment plan, treatment alternatives and goals were discussed and mutually agreed upon: by patient and by family  Mariah Milling 03/24/2022, 3:23 PM

## 2022-03-24 NOTE — Progress Notes (Signed)
PROGRESS NOTE   Subjective/Complaints: Has severe expressive aphasia. Was able to state name, month, year, and where she is. Able to follow commands well.  Daughter at bedside  ROS: +intermittent shoulder pain   Objective:   No results found. Recent Labs    03/23/22 0357  WBC 5.8  HGB 12.4  HCT 38.8  PLT 246   Recent Labs    03/23/22 0357  NA 142  K 3.6  CL 107  CO2 28  GLUCOSE 108*  BUN 8  CREATININE 0.75  CALCIUM 9.1    Intake/Output Summary (Last 24 hours) at 03/24/2022 1031 Last data filed at 03/24/2022 0700 Gross per 24 hour  Intake 440 ml  Output --  Net 440 ml        Physical Exam: Vital Signs Blood pressure (!) 178/85, pulse 85, temperature (!) 97.4 F (36.3 C), temperature source Oral, resp. rate 14, height '5\' 3"'$  (1.6 m), weight 81.5 kg, SpO2 94 %. Gen: no distress, normal appearing HEENT: oral mucosa pink and moist, NCAT Cardio: Reg rate Chest: normal effort, normal rate of breathing Abd: soft, non-distended Ext: no edema Psych: pleasant, normal affect Skin:    General: Skin is warm and dry.     Comments: Left mastectomy scar  Neurological:     Comments: Patient is alert.  Makes eye contact with examiner.  Speech is mildly dysarthric but intelligible.  Provides name and age.  Follows simple commands. Provides biographical information/address. Right central 7. RUE 4/5 with decreased [place]. LUE 4+ to 5/5. RLE 2+/5 prox to 4/5 distally, LLE 3/5 prox to 4+/5 distally. Senses pain and light touch in all 4's. DTR's 1+. No abnl resting tone.   Psychiatric:     Comments: Flat, cooperative    Assessment/Plan: 1. Functional deficits which require 3+ hours per day of interdisciplinary therapy in a comprehensive inpatient rehab setting. Physiatrist is providing close team supervision and 24 hour management of active medical problems listed below. Physiatrist and rehab team continue to assess barriers  to discharge/monitor patient progress toward functional and medical goals  Care Tool:  Bathing        Body parts bathed by helper: Buttocks     Bathing assist Assist Level: Maximal Assistance - Patient 24 - 49%     Upper Body Dressing/Undressing Upper body dressing   What is the patient wearing?: Hospital gown only    Upper body assist Assist Level: Maximal Assistance - Patient 25 - 49%    Lower Body Dressing/Undressing Lower body dressing            Lower body assist       Toileting Toileting    Toileting assist Assist for toileting: 2 Helpers     Transfers Chair/bed transfer  Transfers assist           Locomotion Ambulation   Ambulation assist              Walk 10 feet activity   Assist           Walk 50 feet activity   Assist           Walk 150 feet activity   Assist  Walk 10 feet on uneven surface  activity   Assist           Wheelchair     Assist               Wheelchair 50 feet with 2 turns activity    Assist            Wheelchair 150 feet activity     Assist          Blood pressure (!) 178/85, pulse 85, temperature (!) 97.4 F (36.3 C), temperature source Oral, resp. rate 14, height '5\' 3"'$  (1.6 m), weight 81.5 kg, SpO2 94 %.  Medical Problem List and Plan: 1. Functional deficits secondary to acute ischemic infarct left caudate body.             -patient may shower             -ELOS/Goals: 7-10 days, mod I goals with PT, OT, SLP  Initial CIR evaluations today.  2.  Antithrombotics: -DVT/anticoagulation:  Pharmaceutical: Lovenox             -antiplatelet therapy: Aspirin 81 mg daily and Plavix 75 mg day x3 months then aspirin alone as noted by neurology services Dr.Palikh 3. Shoulder pain: PRN voltaren gel added Tylenol as needed 4. Mood: Wellbutrin 150 mg daily, Effexor 225 mg daily, Remeron 7.5 mg nightly             -antipsychotic agents: N/A             -team  to provide ego support as needed 5. Neuropsych: This patient is capable of making decisions on her own behalf. 6. Skin/Wound Care: Routine skin checks 7. Fluids/Electrolytes/Nutrition: Routine in and outs with follow-up chemistries 8.  Hypertension.  Hydrochlorothiazide 12.5 mg daily added tday.  Monitor with increased mobility. Add amlodipine '5mg'$  daily.  9.  Hypothyroidism.  Synthroid 10.  Hyperlipidemia.  Lipitor. Pt with history of statin intolerance? 11.  Constipation.  MiraLAX daily, Senokot-S daily 12.  GERD.  Protonix 13.  History of left breast cancer.  Status postmastectomy/radiation therapy 2018 14. Hyperglycemia: educated regarding choosing foods with low added sugar.     LOS: 1 days A FACE TO FACE EVALUATION WAS PERFORMED  Clide Deutscher Mayola Mcbain 03/24/2022, 10:31 AM

## 2022-03-25 DIAGNOSIS — E876 Hypokalemia: Secondary | ICD-10-CM | POA: Diagnosis not present

## 2022-03-25 DIAGNOSIS — I6381 Other cerebral infarction due to occlusion or stenosis of small artery: Secondary | ICD-10-CM | POA: Diagnosis not present

## 2022-03-25 DIAGNOSIS — I10 Essential (primary) hypertension: Secondary | ICD-10-CM | POA: Diagnosis not present

## 2022-03-25 DIAGNOSIS — K5900 Constipation, unspecified: Secondary | ICD-10-CM | POA: Diagnosis not present

## 2022-03-25 MED ORDER — POTASSIUM CHLORIDE 20 MEQ PO PACK
40.0000 meq | PACK | Freq: Once | ORAL | Status: AC
Start: 2022-03-25 — End: 2022-03-25
  Administered 2022-03-25: 40 meq via ORAL

## 2022-03-25 MED ORDER — MAGNESIUM GLUCONATE 500 MG PO TABS
250.0000 mg | ORAL_TABLET | Freq: Every day | ORAL | Status: DC
  Administered 2022-03-25 – 2022-03-26 (×2): 250 mg via ORAL
  Filled 2022-03-25 (×2): qty 1

## 2022-03-25 NOTE — Progress Notes (Signed)
Occupational Therapy Session Note  Patient Details  Name: Jessica [name] MRN: [id number] Date of Birth: December 20, 1940  Today's Date: 03/25/2022 OT Individual Time: 6433-2951 OT Individual Time Calculation (min): 70 min    Short Term Goals: Week 1:  OT Short Term Goal 1 (Week 1): Patient will stand with no more than single UE support and intermittent min assistance for LB dressing and/or toileting OT Short Term Goal 2 (Week 1): Patient will demonstrate sufficient range of motion and activity tolerance to use BUE to shampoo her hair in the shower OT Short Term Goal 3 (Week 1): Patient will dress herself (excluding bra) with min assistance OT Short Term Goal 4 (Week 1): Patient will complete toilet hygiene with min assistance  Skilled Therapeutic Interventions/Progress Updates:    Subjective: Pt states she wants to wash up. No c/o pain.  Dtr presents asking to clarify TED hose schedule.    Objective:  Pt semi reclined in bed. Supine to sit with min assist.  Sit to stand at [facility] with CGA and ambulated to sink with min assist.  Pt washed face standing with CGA.  Stand to sit with CGA and doffed shirt with min assist.  Bathed UB with supervision.  Doffed pants and underwear at sit<>stand level with min assist overall.  Bathed periarea with min assist to correct posterior bias and slight rightward lean as well as for thoroughness due to minimal loose bowel. Donned underwear and pants with mod assist.  Brushed teeth in standing with min assist and frequent tactile and verbal cues to correct right lean and posterior bias.  Pt ambulated to EOB with hand held assist and min overall assist.  Sit to supine with CGA.  Call bell in reach, seat alarm on.     Assessment:  Pt making progress evidenced by increased independence during UB self care from mod to min assist.  Primary barriers today included posterior bias tendency with impaired awareness.   Plan: Pt would benefit from further training on neuro re-ed  standing balance and self righting with dual task.   Therapy Documentation Precautions:  Precautions Precautions: Fall Restrictions Weight Bearing Restrictions: No    Therapy/Group: Individual Therapy  Ezekiel Slocumb 03/25/2022, 11:01 AM

## 2022-03-25 NOTE — Progress Notes (Signed)
PROGRESS NOTE   Subjective/Complaints: Doing well with therapy BP still elevated- d/c TEDs during the day, supplement potassium, added amlodipine, add magnesium HS  ROS: +intermittent shoulder pain, +expressive aphasia   Objective:   No results found. Recent Labs    03/23/22 0357 03/24/22 1419  WBC 5.8 5.5  HGB 12.4 12.4  HCT 38.8 38.5  PLT 246 249   Recent Labs    03/23/22 0357 03/24/22 1419  NA 142 136  K 3.6 3.1*  CL 107 101  CO2 28 28  GLUCOSE 108* 150*  BUN 8 6*  CREATININE 0.75 0.87  CALCIUM 9.1 8.9    Intake/Output Summary (Last 24 hours) at 03/25/2022 1055 Last data filed at 03/25/2022 7062 Gross per 24 hour  Intake 415 ml  Output --  Net 415 ml        Physical Exam: Vital Signs Blood pressure (!) 170/77, pulse 72, temperature (!) 97.4 F (36.3 C), temperature source Oral, resp. rate 19, height '5\' 3"'$  (1.6 m), weight 81.5 kg, SpO2 94 %. Gen: no distress, normal appearing HEENT: oral mucosa pink and moist, NCAT Cardio: Reg rate Chest: normal effort, normal rate of breathing Abd: soft, non-distended Ext: no edema Psych: pleasant, normal affect Skin:    General: Skin is warm and dry.     Comments: Left mastectomy scar  Neurological:     Comments: Patient is alert.  Makes eye contact with examiner.  Speech is mildly dysarthric but intelligible. Expressive aphasia.  Provides name and age.  Follows simple commands. Provides biographical information/address. Right central 7. RUE 4/5 with decreased [place]. LUE 4+ to 5/5. RLE 2+/5 prox to 4/5 distally, LLE 3/5 prox to 4+/5 distally. Senses pain and light touch in all 4's. DTR's 1+. No abnl resting tone.   Psychiatric:     Comments: Flat, cooperative    Assessment/Plan: 1. Functional deficits which require 3+ hours per day of interdisciplinary therapy in a comprehensive inpatient rehab setting. Physiatrist is providing close team supervision and 24  hour management of active medical problems listed below. Physiatrist and rehab team continue to assess barriers to discharge/monitor patient progress toward functional and medical goals  Care Tool:  Bathing    Body parts bathed by patient: Right arm, Left arm, Chest, Abdomen, Right upper leg, Left upper leg, Face   Body parts bathed by helper: Front perineal area, Buttocks, Right lower leg, Left lower leg     Bathing assist Assist Level: Moderate Assistance - Patient 50 - 74%     Upper Body Dressing/Undressing Upper body dressing   What is the patient wearing?: Bra, Pull over shirt    Upper body assist Assist Level: Maximal Assistance - Patient 25 - 49%    Lower Body Dressing/Undressing Lower body dressing      What is the patient wearing?: Underwear/pull up, Pants     Lower body assist Assist for lower body dressing: Moderate Assistance - Patient 50 - 74%     Toileting Toileting    Toileting assist Assist for toileting: Moderate Assistance - Patient 50 - 74%     Transfers Chair/bed transfer  Transfers assist     Chair/bed transfer assist level: Minimal  Assistance - Patient > 75%     Locomotion Ambulation   Ambulation assist      Assist level: Minimal Assistance - Patient > 75% Assistive device: Walker-rolling Max distance: 22f   Walk 10 feet activity   Assist     Assist level: Minimal Assistance - Patient > 75% Assistive device: Walker-rolling   Walk 50 feet activity   Assist Walk 50 feet with 2 turns activity did not occur: Safety/medical concerns (fatigue)         Walk 150 feet activity   Assist Walk 150 feet activity did not occur: Safety/medical concerns         Walk 10 feet on uneven surface  activity   Assist Walk 10 feet on uneven surfaces activity did not occur: Safety/medical concerns         Wheelchair     Assist Is the patient using a wheelchair?: Yes Type of Wheelchair: Manual    Wheelchair assist  level: Dependent - Patient 0%      Wheelchair 50 feet with 2 turns activity    Assist        Assist Level: Dependent - Patient 0%   Wheelchair 150 feet activity     Assist      Assist Level: Dependent - Patient 0%   Blood pressure (!) 170/77, pulse 72, temperature (!) 97.4 F (36.3 C), temperature source Oral, resp. rate 19, height '5\' 3"'$  (1.6 m), weight 81.5 kg, SpO2 94 %.  Medical Problem List and Plan: 1. Functional deficits secondary to acute ischemic infarct left caudate body.             -patient may shower             -ELOS/Goals: 7-10 days, mod I goals with PT, OT, SLP  Continue CIR 2.  Antithrombotics: -DVT/anticoagulation:  Pharmaceutical: Lovenox             -antiplatelet therapy: Aspirin 81 mg daily and Plavix 75 mg day x3 months then aspirin alone as noted by neurology services Dr.Palikh 3. Shoulder pain: PRN voltaren gel added Tylenol as needed 4. Mood: Wellbutrin 150 mg daily, Effexor 225 mg daily, Remeron 7.5 mg nightly             -antipsychotic agents: N/A             -team to provide ego support as needed 5. Neuropsych: This patient is capable of making decisions on her own behalf. 6. Skin/Wound Care: Routine skin checks 7. Fluids/Electrolytes/Nutrition: Routine in and outs with follow-up chemistries 8.  Hypertension.  Hydrochlorothiazide 12.5 mg daily added tday.  Monitor with increased mobility. Add amlodipine '5mg'$  daily. Discontinue TEDs.  9.  Hypothyroidism.  Synthroid 10.  Hyperlipidemia.  Lipitor. Pt with history of statin intolerance? 11.  Constipation.  MiraLAX daily, Senokot-S daily. Magnesium gluconate '250mg'$  added HS 12.  GERD.  Protonix 13.  History of left breast cancer.  Status postmastectomy/radiation therapy 2018 14. Hyperglycemia: educated regarding choosing foods with low added sugar.  15. Hypokalemia: supplement 427m klor today.     LOS: 2 days A FACE TO FACE EVALUATION WAS PERFORMED  KrClide Deutscheraulkar 03/25/2022, 10:55 AM

## 2022-03-25 NOTE — Plan of Care (Signed)
  Problem: RH Swallowing Goal: LTG Patient will consume least restrictive diet using compensatory strategies with assistance (SLP) Description: LTG:  Patient will consume least restrictive diet using compensatory strategies with assistance (SLP) Flowsheets (Taken 03/25/2022 1316) LTG: Pt Patient will consume least restrictive diet using compensatory strategies with assistance of (SLP): Minimal Assistance - Patient > 75% Goal: LTG Pt will demonstrate functional change in swallow as evidenced by bedside/clinical objective assessment (SLP) Description: LTG: Patient will demonstrate functional change in swallow as evidenced by bedside/clinical objective assessment (SLP) Flowsheets (Taken 03/25/2022 1316) LTG: Patient will demonstrate functional change in swallow as evidenced by bedside/clinical objective assessment: Oral swallow   Problem: RH Expression Communication Goal: LTG Patient will express needs/wants via multi-modal(SLP) Description: LTG:  Patient will express needs/wants via multi-modal communication (gestures/written, etc) with cues (SLP) Flowsheets (Taken 03/25/2022 1316) LTG: Patient will express needs/wants via multimodal communication (gestures/written, etc) with cueing (SLP): Minimal Assistance - Patient > 75% Goal: LTG Patient will increase speech intelligibility (SLP) Description: LTG: Patient will increase speech intelligibility at word/phrase/conversation level with cues, % of the time (SLP) Flowsheets (Taken 03/25/2022 1316) LTG: Patient will increase speech intelligibility (SLP): Minimal Assistance - Patient > 75% Level: Word Percent of time patient will use intelligible speech: 70   Problem: RH Attention Goal: LTG Patient will demonstrate this level of attention during functional activites (SLP) Description: LTG:  Patient will will demonstrate this level of attention during functional activites (SLP) Flowsheets (Taken 03/25/2022 1316) Patient will demonstrate during  cognitive/linguistic activities the attention type of: Sustained Patient will demonstrate this level of attention during cognitive/linguistic activities in: Controlled LTG: Patient will demonstrate this level of attention during cognitive/linguistic activities with assistance of (SLP): Minimal Assistance - Patient > 75% Number of minutes patient will demonstrate attention during cognitive/linguistic activities: 10   Problem: RH Awareness Goal: LTG: Patient will demonstrate awareness during functional activites type of (SLP) Description: LTG: Patient will demonstrate awareness during functional activites type of (SLP) Flowsheets (Taken 03/25/2022 1316) Patient will demonstrate during cognitive/linguistic activities awareness type of: Emergent LTG: Patient will demonstrate awareness during cognitive/linguistic activities with assistance of (SLP): Minimal Assistance - Patient > 75%

## 2022-03-25 NOTE — Progress Notes (Signed)
CHG bath completed by Gwynneth Albright, NT

## 2022-03-25 NOTE — Evaluation (Signed)
Speech Language Pathology Assessment and Plan  Patient Details  Name: Jessica [name] MRN: [id number] Date of Birth: August 29, 1941  SLP Diagnosis: Dysarthria;Cognitive Impairments;Dysphagia  Rehab Potential: Jessica [name] ELOS: 7-9 days    Today's Date: 03/25/2022 SLP Individual Time: 2947-6546 SLP Individual Time Calculation (min): 21 min   Hospital Problem: Principal Problem:   Left thalamic infarction [facility])  Past Medical History:  Past Medical History:  Diagnosis Date   Allergy    Anemia    Anxiety    Asthma    in past, no inhalers now   Blood transfusion without reported diagnosis    Breast cancer ([place]) 01/2017   left breast    Breast cancer of upper-inner quadrant of left female breast ([place]) 03/11/2017   Cancer ([place]) 01/2017   left breast   Cataract    bilateral removed   Chronic kidney disease    kidney stones   Depression 08/31/2013   Dyslipidemia, goal LDL below 160 08/31/2013   Essential hypertension - well controlled 08/31/2013   Family history of breast cancer    Family history of melanoma    Family history of prostate cancer    GERD (gastroesophageal reflux disease)    Goals of care, counseling/discussion 03/11/2017   History of radiation therapy 10/13/17-11/11/17   left breast 40.05 Gy in 15 fractions, left breast boost 10 Gy in 5 fractions   Hypothyroidism    Obesity (BMI 30-39.9) 08/31/2013   Personal history of chemotherapy 2018   Personal history of radiation therapy 2019   Thyroid disease    Vasovagal near-syncope -rare 08/31/2013   Past Surgical History:  Past Surgical History:  Procedure Laterality Date   ABDOMINAL HYSTERECTOMY     total   BREAST BIOPSY Left 02/24/2017    malignant   BREAST LUMPECTOMY Left 03/23/2017   broken fingers     CATARACT EXTRACTION Bilateral    COLONOSCOPY     EXCISIONAL HEMORRHOIDECTOMY     IR FLUORO GUIDE PORT INSERTION RIGHT  08/04/2017   IR REMOVAL TUN ACCESS W/ PORT W/O FL MOD SED  08/04/2017   IR US GUIDE VASC  ACCESS RIGHT  08/04/2017   IRRIGATION AND DEBRIDEMENT ABSCESS Left 05/18/2017   Procedure: IRRIGATION AND DEBRIDEMENT LEFT AXILLARY ABSCESS;  Surgeon: Jessica Boston, MD;  Location: [facility];  Service: General;  Laterality: Left;   kidney stones lithotripsy     PORTACATH PLACEMENT Right 03/23/2017   Procedure: INSERTION PORT-A-CATH;  Surgeon: Jessica Luna, MD;  Location: [place];  Service: General;  Laterality: Right;   RADIOACTIVE SEED GUIDED PARTIAL MASTECTOMY WITH AXILLARY SENTINEL LYMPH NODE BIOPSY Left 03/23/2017   Procedure: LEFT BREAST RADIOACTIVE SEED GUIDED PARTIAL MASTECTOMY AND  SENTINEL LYMPH NODE Advance;  Surgeon: Jessica Luna, MD;  Location: [place];  Service: General;  Laterality: Left;   ROTATOR CUFF REPAIR     left    TONSILLECTOMY     WISDOM TOOTH EXTRACTION      Assessment / Plan / Recommendation Clinical Impression Clinical Impression: Patient is a 81 y.o. year old female with history of hypertension, hyperlipidemia, hypothyroidism, asthma, depression/anxiety, left breast cancer status post partial mastectomy 2018 as well as radiation therapy, obesity with BMI 31.89.  Patient lived alone.   And was independent prior to admission.  Presented 03/20/2022 with right-sided weakness slurred speech and altered mental status with right facial droop of acute onset.  CT/MRI showed small focus of acute ischemia within the left caudate body.  Old right basal ganglia small  vessel infarct and findings of chronic microvascular disease.  Patient did not receive tPA.  Echocardiogram ejection fraction of 60 to 65% no wall motion abnormalities.  Patient transferred to CIR on 03/23/2022.  SLP consulted to complete clinical swallow, motor speech, and cognitive-linguistic evaluations s/p acute CVA within the L caudate body. Pt received lethargic, though awake and sitting semi-reclined in bed. Supportive daughter, Jessica [name], at bedside. Agreeable to ST evaluation.  Per  clinical swallow evaluation, pt presents with mild oral dysphagia in the setting of mild R orofacial weakness s/p CVA. Aforementioned weakness appears to negatively impact bolus containment/cohesion as evident by anterior bolus loss on R side and prolonged oral transit with oral residuals noted R>L post-prandially with regular solid textures. Minimal to no oral residuals noted with any other consistencies assessed; please see below for details. From a pharyngoesophageal standpoint, pt exhibited immediate cough x 1 with thin liquid via straw when reclined in bed. When SLP assisted pt in sitting upright, no further s/sx concerning for aspiration were observed with challenging PO trials of thin liquid via straw, puree, regular solid textures, and mixed consistencies. No evidence of belching or regurgitation noted at bedside. Pt and pt's daughter endorse difficulty with some of the textures that have been coming up on the regular meal trays. In light of current oral deficits and pt/family reports + preference, recommend texture downgrade to Dysphagia 3/mechanical soft with thin liquids; medications whole with water given full supervision and adherence to general aspiration precautions to include lingual sweep which pt completed with Mod I and alternating liquids and solids.  Per motor speech and cognitive-linguistic evaluation, pt presents with severe motor speech impairment consistent with hypokinetic dysarthria. Intelligibility was grossly judged to be 50% at the word level and <50% as length of utterance increased. Speech intelligibility was negatively impacted by flat affect, imprecise articulation, low vocal intensity, absent prosody, and poor word breaks/phrasing. With verbal cues to utilize speech intelligibility strategies, intelligibility improved to ~60% at the word level. Receptive and expressive language skills appear grossly intact for basic commands and expression of wants/needs given extra processing  time given poor initiation. Cognitive-linguistic evaluation was limited to function tasks, with pt demonstrating orientation x 4, focused attention, and basic recall given Min A.  Given assessment findings, pt would benefit from skilled ST intervention targeting aforementioned deficits, as well as to provide education to maximize pt's independence and decrease caregiver burden upon discharge. Results and recommendations were reviewed with pt and pt's daughter who verbalized understanding and agreement with proposed POC. Please see below for additional details.   Pt left in bed with safety measures in place. Call bell within reach and all immediate needs met. Daughter present. Continue per ST POC established this date.    Skilled Therapeutic Interventions          Clinical swallow, motor speech, and cognitive-linguistic evaluations completed this date. Please see above for details.   SLP Assessment  Patient will need skilled Speech Lanaguage Pathology Services during CIR admission    Recommendations  SLP Diet Recommendations: Dysphagia 3 (Mech soft);Thin Liquid Administration via: Straw;Cup Medication Administration: Whole meds with liquid Compensations: Slow rate;Small sips/bites;Lingual sweep for clearance of pocketing;Minimize environmental distractions;Follow solids with liquid Postural Changes and/or Swallow Maneuvers: Out of bed for meals;Seated upright 90 degrees;Upright 30-60 min after meal Oral Care Recommendations: Oral care BID Patient destination: Home Follow up Recommendations: Outpatient SLP;Home Health SLP;24 hour supervision/assistance Equipment Recommended: None recommended by SLP    SLP Frequency 3 to 5 out  of 7 days   SLP Duration  SLP Intensity  SLP Treatment/Interventions 7-9 days  Minumum of 1-2 x/day, 30 to 90 minutes  Dysphagia/aspiration precaution training;Functional tasks;Patient/family education;Therapeutic Activities;Environmental controls (speech  intelligibility strategies)    Pain Pain Assessment Pain Scale: 0-10 Pain Score: 0-No pain  Prior Functioning Cognitive/Linguistic Baseline: Within functional limits Type of Home: House  Lives With: Alone Available Help at Discharge: Family;Available 24 hours/day Vocation: Retired  Programmer, systems Overall Cognitive Status: Impaired/Different from baseline Arousal/Alertness: Lethargic Orientation Level: Oriented X4 Year: 2023 Month: May Attention: Focused;Sustained Focused Attention: Appears intact Sustained Attention: Impaired Sustained Attention Impairment: Verbal basic;Functional basic Memory: Impaired Memory Impairment: Decreased recall of new information Awareness: Impaired Awareness Impairment: Emergent impairment Problem Solving: Impaired Problem Solving Impairment: Functional basic;Verbal basic Executive Function: Initiating Initiating: Impaired Initiating Impairment: Functional basic;Verbal basic Behaviors: Other (comment) (flat affect)  Comprehension Auditory Comprehension Overall Auditory Comprehension: Appears within functional limits for tasks assessed Yes/No Questions: Within Functional Limits Commands: Within Functional Limits Conversation: Simple Interfering Components: Attention;Processing speed EffectiveTechniques: Extra processing time;Pausing;Repetition;Slowed speech Visual Recognition/Discrimination Discrimination: Not tested Reading Comprehension Reading Status: Within funtional limits Expression Expression Primary Mode of Expression: Verbal Verbal Expression Overall Verbal Expression: Impaired Initiation: Impaired Level of Generative/Spontaneous Verbalization: Word;Phrase;Sentence Naming:  (Confrontational naming: 100% accuracy. Responsive naming: 100% accuracy) Pragmatics: Impairment Impairments: Eye contact;Monotone;Abnormal affect Interfering Components: Attention;Speech intelligibility Effective Techniques: Semantic  cues;Phonemic cues Non-Verbal Means of Communication: Not applicable Written Expression Dominant Hand: Right Written Expression: Not tested Oral Motor Oral Motor/Sensory Function Overall Oral Motor/Sensory Function: Mild impairment Facial ROM: Reduced right Facial Symmetry: Abnormal symmetry right Facial Strength: Reduced right Facial Sensation: Reduced right Lingual ROM: Within Functional Limits Lingual Symmetry: Within Functional Limits Lingual Sensation: Within Functional Limits Velum: Other (comment) (DNT) Mandible: Within Functional Limits Motor Speech Overall Motor Speech: Impaired Respiration: Within functional limits Phonation: Low vocal intensity;Normal Resonance: Within functional limits Articulation: Impaired Level of Impairment: Word Intelligibility: Intelligibility reduced Word: 50-74% accurate (within a known context) Phrase: 25-49% accurate Sentence: 0-24% accurate Conversation: 0-24% accurate Motor Planning: Witnin functional limits Motor Speech Errors: Not applicable Effective Techniques: Slow rate;Increased vocal intensity;Over-articulate;Pause  Care Tool Care Tool Cognition Ability to hear (with hearing aid or hearing appliances if normally used Ability to hear (with hearing aid or hearing appliances if normally used): 0. Adequate - no difficulty in normal conservation, social interaction, listening to TV   Expression of Ideas and Wants Expression of Ideas and Wants: 3. Some difficulty - exhibits some difficulty with expressing needs and ideas (e.g, some words or finishing thoughts) or speech is not clear   Understanding Verbal and Non-Verbal Content Understanding Verbal and Non-Verbal Content: 3. Usually understands - understands most conversations, but misses some part/intent of message. Requires cues at times to understand  Memory/Recall Ability Memory/Recall Ability : Current season;That he or she is in a hospital/hospital unit   PMSV Assessment  PMSV  Trial Intelligibility: Intelligibility reduced Word: 50-74% accurate (within a known context) Phrase: 25-49% accurate Sentence: 0-24% accurate Conversation: 0-24% accurate  Bedside Swallowing Assessment General Date of Onset: 03/20/22 Previous Swallow Assessment: CSE completed on 03/22/2022 with recommendations for regular textures and thin liquids; no instrumental study in EMR Diet Prior to this Study: Regular;Thin liquids Temperature Spikes Noted: No Respiratory Status: Room air History of Recent Intubation: No Behavior/Cognition: Cooperative;Lethargic/Drowsy;Requires cueing Oral Cavity - Dentition: Adequate natural dentition Self-Feeding Abilities: Needs set up;Needs assist Vision: Functional for self-feeding Patient Positioning: Upright in bed Baseline Vocal Quality: Normal;Low vocal intensity Volitional Cough: Strong  Volitional Swallow: Able to elicit  Oral Care Assessment Does patient have any of the following "high(er) risk" factors?: None of the above Does patient have any of the following "at risk" factors?: None of the above Patient is LOW RISK: Follow universal precautions (see row information) Ice Chips Ice chips: Not tested Thin Liquid Thin Liquid: Within functional limits Presentation: Straw Nectar Thick Nectar Thick Liquid: Not tested Honey Thick Honey Thick Liquid: Not tested Puree Puree: Within functional limits Presentation: Self Fed;Spoon (with set-up) Solid Solid: Impaired Presentation: Self Fed (with set-up) Oral Phase Impairments: Impaired mastication Oral Phase Functional Implications: Right lateral sulci pocketing;Impaired mastication;Oral residue;Prolonged oral transit;Right anterior spillage Pharyngeal Phase Impairments:  (None) BSE Assessment Suspected Esophageal Findings Suspected Esophageal Findings:  (N/A) Risk for Aspiration Impact on safety and function: Mild aspiration risk Other Related Risk Factors: History of GERD (On PPI)  Short  Term Goals: Week 1: SLP Short Term Goal 1 (Week 1): Pt will utilize 2 out of 4 compensatory speech intelligibility strategies at the word and short phrase level with 60% intelligibility given Min A. SLP Short Term Goal 2 (Week 1): Pt will participate in trials of regular textures with functional mastication and transit time given Sup A for adherence to aspiration precautions and oral clearance of residuals. SLP Short Term Goal 3 (Week 1): Pt will demonstrate functional recall of daily information x 3 given Min A. SLP Short Term Goal 4 (Week 1): With Sup A cueing from communication partner, pt will self-correct communication breakdown via repetition of verbal message or use of multimodal methods.  Refer to Care Plan for Long Term Goals  Recommendations for other services: None   Discharge Criteria: Patient will be discharged from SLP if patient refuses treatment 3 consecutive times without medical reason, if treatment goals not met, if there is a change in medical status, if patient makes no progress towards goals or if patient is discharged from hospital.  The above assessment, treatment plan, treatment alternatives and goals were discussed and mutually agreed upon: by patient and by family  Romelle Starcher A Dontavius Keim 03/25/2022, 12:25 PM

## 2022-03-25 NOTE — Patient Care Conference (Signed)
Inpatient RehabilitationTeam Conference and Plan of Care Update Date: 03/25/2022   Time: 11:16 AM    Patient Name: Jessica [name]      Medical Record Number: [id number]  Date of Birth: [birth year] Sex: Female         Room/Bed: 4W06C/4W06C-01 Payor Info: Payor: [employer] MEDICARE / Plan: [facility] MEDICARE / Product Type: *No Product type* /    Admit Date/Time:  03/23/2022  5:20 PM  Primary Diagnosis:  Left thalamic infarction [facility])  Hospital Problems: Principal Problem:   Left thalamic infarction [facility])    Expected Discharge Date: Expected Discharge Date: 03/31/22  Team Members Present: Physician leading conference: Dr. Leeroy Cha Social Worker Present: Ovidio Kin, LCSW Nurse Present: [name], RN PT Present: Ginnie Smart, PT OT Present: Leretha Pol, OT SLP Present: Helaine Chess, SLP PPS Coordinator present : Gunnar Fusi, SLP     Current Status/Progress Goal Weekly Team Focus  Bowel/Bladder     Continent        Swallow/Nutrition/ Hydration   Dysphagia 3 with thin liquids + aspiration precautions  Min A  Diet tolerance and implementation of aspiration precautions   ADL's   moderate assistance  supervision/modified independence  improve functional transfers and increase activity tolerance, address possible depression/anxiety as warranted - quite flat during OT evaluation   Mobility   minA bed mobility, minA stand<>pivot transfers using RW, minA gait ~59f using RW.  supervision  general strengthening and endurance training, dynamic standing balance, gait training, DC planning   Communication   <50% speech intelligibility at word to phrase level; basic naming and auditory comprehension.  Min A  education on compensatory speech intelligibility strategies   Safety/Cognition/ Behavioral Observations  Max to Total A  Min A  orientation, recall, emergent awareness   Pain     Left shoulder pain        Skin     N/A          Discharge Planning:   Going to daughter's home with all three daughter's taking turns providing care to her. They visit daily and are supportive   Team Discussion: Patient with flat affect, expressive issues and dysarthria post left thalamic infarct.  MD added magnesium and addressed hypertensive medications.    Patient on target to meet rehab goals: yes, currently needs min assist for upper body care and mod assist for lower body bathing and dressing. Needs cues for initiation and problem solving; working on basic recall and problem solving. Needs min assist for stand pivot transfers and able to ambulate up to 50' with min assist. Goals for discharge set for supervision overall and mod I for gait.  *See Care Plan and progress notes for long and short-term goals.   Revisions to Treatment Plan:  N/A   Teaching Needs: Safety, medications, transfers, toileting, etc  Current Barriers to Discharge: Decreased caregiver support and Home enviroment access/layout  Possible Resolutions to Barriers: Family education     Medical Summary Current Status: hypertension, difficulty swallowing  Barriers to Discharge: Medical stability;Weight  Barriers to Discharge Comments: hypertension, difficulty swallowing Possible Resolutions to [employer]: discontinued teds, add magnesium gluconate '250mg'$  HS, add amlodipine '5mg'$ , discussed with nursing which medications she is having a hard time with and crushing in applesauce   Continued Need for Acute Rehabilitation Level of Care: The patient requires daily medical management by a physician with specialized training in physical medicine and rehabilitation for the following reasons: Direction of a multidisciplinary physical rehabilitation program to maximize functional independence :  Yes Medical management of patient stability for increased activity during participation in an intensive rehabilitation regime.: Yes Analysis of laboratory values and/or radiology reports with  any subsequent need for medication adjustment and/or medical intervention. : Yes   I attest that I was present, lead the team conference, and concur with the assessment and plan of the team.   [name] B 03/25/2022, 5:05 PM

## 2022-03-25 NOTE — Progress Notes (Signed)
Patient ID: Jessica [name], female   DOB: 09/21/1941, 80 y.o.   MRN: [id number]  Met with pt and Karen-daughter ws present in her room to update regarding team conference goals of supervision level and target discharge date of 5/30. Awaiting equipment needs. Daughter would like some advance notice of this. Discussed home health versus OP therapies and daughter thought home health would be better at first. No preference on agency. Could not tell how pt was feeling regarding discharge date due to did not respond and shut her eyes and went to sleep. Daughter reports prior to this stroke pt was very talkative and is not now and she can not tell if or when the pt is joking or not. She reports she will be patient and hopeful regarding her progressing in her speech. 

## 2022-03-25 NOTE — Progress Notes (Signed)
Physical Therapy Session Note  Patient Details  Name: Jessica [name] MRN: [id number] Date of Birth: Oct 06, 1941  Today's Date: 03/25/2022 PT Individual Time: 1302-1414 PT Individual Time Calculation (min): 72 min   Short Term Goals: Week 1:  PT Short Term Goal 1 (Week 1): STG = LTG due to ELOS  Skilled Therapeutic Interventions/Progress Updates:      Pt seen sitting EOB with daughter sitting next to her. Pt agreeable to PT tx. Reports chronic 4/10 L shoulder pain. Rest breaks and mobility provided for pain management.   Daughter asking to be checked off for bed<>chair and toilet transfers. Educated her on posterior bias and balance deficits. Also informed her of guarding technique, use of gait belt at all times, and importance of making sure w/c brakes are locked. Daughter assist pt to stand with PT supervision, she also provide CGA/minA for her while ambulating to the bathroom - daughter doing a great job of guarding her and cueing her for safety. Signed off on Safety Plan and RN notified via secure chat.   Transported her to main rehab gym for time management. Instructed in gait training, ambulating 149f + 1287fwith CGA and RW. Primary gait deficits include decreased gait speed, short stride length, decreased bilateral foot clearance, and unsteadiness with turns. Gait speed 0.24m24m indicative of increased falls risk and household ambulator.   Worked on step-curb transfers (4inch curb) to simulate home entrance. She went up/down x4 times total with RW and minA. Required step-by-step cueing for sequencing and minA for balance due to her posterior bias. Will require hands-on family training prior to DC to ensure understanding and assistance.   Instructed in alternating toe taps to 4inch curb using RW for UE support and requiring minA due to her persistent posterior lean. 2x20 with seated rest break. VC throughout for adjusting COG to encourage forward weight shifting and reduce LOB  posteriorly.   Pt transported back to her room for time in her w/c. Completed ambulatory transfer within her room with CGA and RW - cues for safety approach to bed. Required minA for bed mobility via log rolling technique to reduce posterior bias.   Remained in bed, bed alarm on, daughter at bedside, all needs in reach.   Therapy Documentation Precautions:  Precautions Precautions: Fall Restrictions Weight Bearing Restrictions: No General:     Therapy/Group: Individual Therapy  ChrAlger Simons24/2023, 7:28 AM

## 2022-03-26 DIAGNOSIS — E876 Hypokalemia: Secondary | ICD-10-CM | POA: Diagnosis not present

## 2022-03-26 DIAGNOSIS — K5900 Constipation, unspecified: Secondary | ICD-10-CM | POA: Diagnosis not present

## 2022-03-26 DIAGNOSIS — I6381 Other cerebral infarction due to occlusion or stenosis of small artery: Secondary | ICD-10-CM | POA: Diagnosis not present

## 2022-03-26 DIAGNOSIS — I10 Essential (primary) hypertension: Secondary | ICD-10-CM | POA: Diagnosis not present

## 2022-03-26 LAB — BASIC METABOLIC PANEL
Anion gap: 8 (ref 5–15)
BUN: 11 mg/dL (ref 8–23)
CO2: 30 mmol/L (ref 22–32)
Calcium: 9.4 mg/dL (ref 8.9–10.3)
Chloride: 102 mmol/L (ref 98–111)
Creatinine, Ser: 0.82 mg/dL (ref 0.44–1.00)
GFR, Estimated: >60 mL/min (ref >60)
Glucose, Bld: 112 mg/dL — ABNORMAL HIGH (ref 70–99)
Potassium: 3.5 mmol/L (ref 3.5–5.1)
Sodium: 140 mmol/L (ref 135–145)

## 2022-03-26 MED ORDER — MODAFINIL 100 MG PO TABS
100.0000 mg | ORAL_TABLET | Freq: Every day | ORAL | Status: DC
  Administered 2022-03-26 – 2022-03-31 (×6): 100 mg via ORAL
  Filled 2022-03-26 (×6): qty 1

## 2022-03-26 MED ORDER — POTASSIUM CHLORIDE 20 MEQ PO PACK
20.0000 meq | PACK | Freq: Every day | ORAL | Status: DC
  Administered 2022-03-26 – 2022-03-27 (×2): 20 meq via ORAL
  Filled 2022-03-26 (×2): qty 1

## 2022-03-26 NOTE — Progress Notes (Signed)
Physical Therapy Session Note  Patient Details  Name: Jessica [name] MRN: [id number] Date of Birth: September 07, 1941  Today's Date: 03/26/2022 PT Individual Time: 0900-0930 + 1415-1525 PT Individual Time Calculation (min): 30 min  + 70 min  Short Term Goals: Week 1:  PT Short Term Goal 1 (Week 1): STG = LTG due to ELOS  Skilled Therapeutic Interventions/Progress Updates:      1st session: Pt sitting in w/c to start - daughter [name]) at bedside. Pt agreeable to PT tx and denies pain. Daughter trained yesterday in transfers and toilet transfers - she reports confidence in her ability to assist with this - PT did adjust BSC to height as pt is short (5'3). Transported to main rehab gym in w/c for time management. Instructed in gait training where she ambulated 152f with CGA and RW - x1 LOB to the R when PT provided instruction for quick turning, minA required for LOB correction. VC during gait for increased stride length and heel strike bilaterally. Then worked on sEngineer, manufacturingsteps and 2 hand rails - CGA/minA provided for posterior lean while navigating steps - step-to pattern while forward facing - completed 4 + 4 steps in total with seated rest break. Returned to her room where daughter was updated on pt's mobility. Sitting in w/c with all needs met.   2nd session: Pt seen sitting in w/c to start - daughter ([name] at the bedside. Pt agreeable without reports of pain. Focused session on family training and education. KSantiago Gladpresent throughout for hands on training as well as active observation.  Completed ambulatory transfer to the car with car height simulating typical sedan - CGA for ambulating to the car using RW for support - mod cues for sequencing and safety approach to car - she's able to manage LE in/out of car without assist but did require ++ time. She then practiced ambulating up/down ~875framp with CGA and RW - cues for safety awareness while descending > ascending and to  keep body within walker frame.   Gait training during session with CGA and RW with intermittent light minA while turning due to posterior bias and R sided weakness. Distances were variable ~5065fo ~[id number] times. Cues for increasing her R step length due to the step-to gait pattern, minimal response to verbal and visual cues - best response to "step past your left foot."   Completed tub/shower transfer using tub bench without back rest - she required minA for sitting to tub bench and minA for scooting across bench into the tub. MinA required to manage posterior lean to prevent posterior LOB. Discussed home safety with tub and recommended permanent grab bars installed in the bathroom.   Finished session with curb step transfers using the 4inch step. Provided demonstration to improve understanding and carryover. She completed x4 of these transfers with CGA/minA and RW. KareSantiago Gladeotaping for future reference. Pt did much better with this compared to yesterday, with better understanding of sequencing and safety.   Pt returned to her room and assisted back to bed via stand<>pivot transfer using the bed rail, minA for steadying while turning. MinA for sit>supine for BLE management, completed via log rolling technique. All needs met with daughter at bedside, call bell in lap.   Therapy Documentation Precautions:  Precautions Precautions: Fall Restrictions Weight Bearing Restrictions: No General:    Therapy/Group: Individual Therapy  ChriAlger Simons5/2023, 7:31 AM

## 2022-03-26 NOTE — Progress Notes (Signed)
Speech Language Pathology Daily Session Note  Patient Details  Name: Jessica [name] MRN: [id number] Date of Birth: Mar 05, 1941  Today's Date: 03/26/2022 SLP Individual Time: 1962-2297 SLP Individual Time Calculation (min): 58 min  Short Term Goals: Week 1: SLP Short Term Goal 1 (Week 1): Pt will utilize 2 out of 4 compensatory speech intelligibility strategies at the word and short phrase level with 60% intelligibility given Min A. - 50% with Min A  SLP Short Term Goal 2 (Week 1): Pt will participate in trials of regular textures with functional mastication and transit time given Sup A for adherence to aspiration precautions and oral clearance of residuals. - Cough x 1 with intake of thin liquid via straw. No additional s/sx concerning for aspiration.   SLP Short Term Goal 3 (Week 1): Pt will demonstrate functional recall of daily information x 3 given Min A. - x 2 given Min A.  SLP Short Term Goal 4 (Week 1): With Sup A cueing from communication partner, pt will self-correct communication breakdown via repetition of verbal message or use of multimodal methods. - Min-Mod A to self-correct communication breakdown and articulation errors. Benefited from compensatory speech intelligibility strategy training and verbal articulatory placement cues.  Skilled Therapeutic Interventions: Pt seen this date for skilled ST intervention targeting dysphagia and speech intelligibility strategies outlined above. Pt received awake/alert and OOB in w/c. Supportive daughter present. Agreeable to ST intervention. Please see above for objective data re: pt's performance on targeted goals. Pt left in w/c with safety measures activated and daughter present. Continue per current ST POC.   Pain No/Denies pain; NAD appreciated   Therapy/Group: Individual Therapy  Ersel Enslin A George Alcantar 03/26/2022, 12:36 PM

## 2022-03-26 NOTE — Progress Notes (Signed)
PROGRESS NOTE   Subjective/Complaints: BP 165/73- started magnesium and night and potassium supplement. F/u potassium today.  Daughter can supervise patient in room  ROS: +intermittent shoulder pain, +expressive aphasia, +fatigue   Objective:   No results found. Recent Labs    03/24/22 1419  WBC 5.5  HGB 12.4  HCT 38.5  PLT 249   Recent Labs    03/24/22 1419  NA 136  K 3.1*  CL 101  CO2 28  GLUCOSE 150*  BUN 6*  CREATININE 0.87  CALCIUM 8.9    Intake/Output Summary (Last 24 hours) at 03/26/2022 1030 Last data filed at 03/26/2022 0845 Gross per 24 hour  Intake 840 ml  Output --  Net 840 ml        Physical Exam: Vital Signs Blood pressure (!) 165/73, pulse 73, temperature 97.7 F (36.5 C), temperature source Oral, resp. rate 18, height '5\' 3"'$  (1.6 m), weight 81.5 kg, SpO2 96 %. Gen: no distress, normal appearing, yawning, fatigued HEENT: oral mucosa pink and moist, NCAT Cardio: Reg rate Chest: normal effort, normal rate of breathing Abd: soft, non-distended Ext: no edema Psych: pleasant, normal affect Skin:    General: Skin is warm and dry.     Comments: Left mastectomy scar  Neurological:     Comments: Patient is alert.  Makes eye contact with examiner.  Speech is mildly dysarthric but intelligible. Expressive aphasia.  Provides name and age.  Follows simple commands. Provides biographical information/address. Right central 7. RUE 4/5 with decreased [place]. LUE 4+ to 5/5. RLE 2+/5 prox to 4/5 distally, LLE 3/5 prox to 4+/5 distally. Senses pain and light touch in all 4's. DTR's 1+. No abnl resting tone.   Psychiatric:     Comments: Flat, cooperative    Assessment/Plan: 1. Functional deficits which require 3+ hours per day of interdisciplinary therapy in a comprehensive inpatient rehab setting. Physiatrist is providing close team supervision and 24 hour management of active medical problems listed  below. Physiatrist and rehab team continue to assess barriers to discharge/monitor patient progress toward functional and medical goals  Care Tool:  Bathing    Body parts bathed by patient: Right arm, Left arm, Chest, Abdomen, Right upper leg, Left upper leg, Face   Body parts bathed by helper: Front perineal area, Buttocks, Right lower leg, Left lower leg     Bathing assist Assist Level: Moderate Assistance - Patient 50 - 74%     Upper Body Dressing/Undressing Upper body dressing   What is the patient wearing?: Bra, Pull over shirt    Upper body assist Assist Level: Maximal Assistance - Patient 25 - 49%    Lower Body Dressing/Undressing Lower body dressing      What is the patient wearing?: Underwear/pull up, Pants     Lower body assist Assist for lower body dressing: Moderate Assistance - Patient 50 - 74%     Toileting Toileting    Toileting assist Assist for toileting: Moderate Assistance - Patient 50 - 74%     Transfers Chair/bed transfer  Transfers assist     Chair/bed transfer assist level: Minimal Assistance - Patient > 75%     Locomotion Ambulation   Ambulation  assist      Assist level: Minimal Assistance - Patient > 75% Assistive device: Walker-rolling Max distance: 50f   Walk 10 feet activity   Assist     Assist level: Minimal Assistance - Patient > 75% Assistive device: Walker-rolling   Walk 50 feet activity   Assist Walk 50 feet with 2 turns activity did not occur: Safety/medical concerns (fatigue)         Walk 150 feet activity   Assist Walk 150 feet activity did not occur: Safety/medical concerns         Walk 10 feet on uneven surface  activity   Assist Walk 10 feet on uneven surfaces activity did not occur: Safety/medical concerns         Wheelchair     Assist Is the patient using a wheelchair?: Yes Type of Wheelchair: Manual    Wheelchair assist level: Dependent - Patient 0%      Wheelchair 50  feet with 2 turns activity    Assist        Assist Level: Dependent - Patient 0%   Wheelchair 150 feet activity     Assist      Assist Level: Dependent - Patient 0%   Blood pressure (!) 165/73, pulse 73, temperature 97.7 F (36.5 C), temperature source Oral, resp. rate 18, height '5\' 3"'$  (1.6 m), weight 81.5 kg, SpO2 96 %.  Medical Problem List and Plan: 1. Functional deficits secondary to acute ischemic infarct left caudate body.             -patient may shower             -ELOS/Goals: 7-10 days, mod I goals with PT, OT, SLP  Continue CIR 2.  Antithrombotics: -DVT/anticoagulation:  Pharmaceutical: Lovenox             -antiplatelet therapy: Aspirin 81 mg daily and Plavix 75 mg day x3 months then aspirin alone as noted by neurology services Dr.Palikh 3. Shoulder pain: PRN voltaren gel added Tylenol as needed 4. Mood: Wellbutrin 150 mg daily, Effexor 225 mg daily, Remeron 7.5 mg nightly             -antipsychotic agents: N/A             -team to provide ego support as needed 5. Neuropsych: This patient is capable of making decisions on her own behalf. 6. Skin/Wound Care: Routine skin checks 7. Fluids/Electrolytes/Nutrition: Routine in and outs with follow-up chemistries 8.  Hypertension.  Hydrochlorothiazide 12.5 mg daily added tday.  Monitor with increased mobility. Add amlodipine '5mg'$  daily. Discontinue TEDs.  9.  Hypothyroidism.  Synthroid 10.  Hyperlipidemia.  Lipitor. Pt with history of statin intolerance? 11.  Constipation.  MiraLAX daily, Senokot-S daily. Magnesium gluconate '250mg'$  added HS 12.  GERD.  Protonix 13.  History of left breast cancer.  Status postmastectomy/radiation therapy 2018 14. Hyperglycemia: educated regarding choosing foods with low added sugar.  15. Hypokalemia: supplement 27m klor daily.  16. Daytime somnolence: Modafinil '100mg'$  daily started.  17. OSA: refer for outpatient [facility] eval    LOS: 3 days A FACE TO FACE EVALUATION WAS  PERFORMED  Kiptyn Rafuse P Naturi Alarid 03/26/2022, 10:30 AM

## 2022-03-26 NOTE — Plan of Care (Signed)
  Problem: Consults Goal: RH STROKE PATIENT EDUCATION Description: See Patient Education module for education specifics  Outcome: Progressing   Problem: RH BOWEL ELIMINATION Goal: RH STG MANAGE BOWEL WITH ASSISTANCE Description: STG Manage Bowel with mod I Assistance. Outcome: Progressing   Problem: RH BOWEL ELIMINATION Goal: RH STG MANAGE BOWEL W/MEDICATION W/ASSISTANCE Description: STG Manage Bowel with Medication with mod I Assistance. Outcome: Progressing   Problem: Consults Goal: RH STROKE PATIENT EDUCATION Description: See Patient Education module for education specifics  Outcome: Progressing   Problem: RH BOWEL ELIMINATION Goal: RH STG MANAGE BOWEL WITH ASSISTANCE Description: STG Manage Bowel with mod I Assistance. Outcome: Progressing Goal: RH STG MANAGE BOWEL W/MEDICATION W/ASSISTANCE Description: STG Manage Bowel with Medication with mod I Assistance. Outcome: Progressing   Problem: RH SAFETY Goal: RH STG ADHERE TO SAFETY PRECAUTIONS W/ASSISTANCE/DEVICE Description: STG Adhere to Safety Precautions With cues Assistance/Device. Outcome: Progressing   Problem: RH KNOWLEDGE DEFICIT Goal: RH STG INCREASE KNOWLEDGE OF HYPERTENSION Outcome: Progressing Goal: RH STG INCREASE KNOWLEGDE OF HYPERLIPIDEMIA Description: Patient and dtr will be able to manage HLD with medications and dietary modifications using handouts and educational resources independently Outcome: Progressing Goal: RH STG INCREASE KNOWLEDGE OF STROKE PROPHYLAXIS Description: Patient and dtr will be able to manage secondary risks with medications and dietary modifications using handouts and educational resources independently Outcome: Progressing

## 2022-03-26 NOTE — Progress Notes (Signed)
Occupational Therapy Session Note  Patient Details  Name: Jessica [name] MRN: [id number] Date of Birth: 11/01/1941  Today's Date: 03/26/2022 OT Individual Time: 0730-0830 OT Individual Time Calculation (min): 60 min    Short Term Goals: Week 1:  OT Short Term Goal 1 (Week 1): Patient will stand with no more than single UE support and intermittent min assistance for LB dressing and/or toileting OT Short Term Goal 2 (Week 1): Patient will demonstrate sufficient range of motion and activity tolerance to use BUE to shampoo her hair in the shower OT Short Term Goal 3 (Week 1): Patient will dress herself (excluding bra) with min assistance OT Short Term Goal 4 (Week 1): Patient will complete toilet hygiene with min assistance   Skilled Therapeutic Interventions/Progress Updates:  Pt states "I want to take a shower" No c/o pain throughout.   Pt sitting up in w/c.  Transported to bathroom and completed stand pivot using grab bars with CGA.  Doffed pants and underwear at sit<>stand level with increased time and CGA.  Doffed socks with max assist primarily due to tight space in shower stall limiting pts mobility.  Doffed shirt with CGA.  Bathed UB using long handled sponge as needed with close supervision.  Bathed LB at sit<>stand level with CGA using grab bar and unilateral UE release.  Pt dried off and completed stand pivot back to w/c using grab bars and needing tactile and verbal cues to shift weight forward to reduce posterior bias.  Pt also needed tactile and verbal cues while sitting on shower bench to reduce posterior pelvic tilt and kyphotic trunk posture.  Max assist provided to complete UB/LB dressing due to OT session time constraint.  Dtr present and agreeable to assisting pt with grooming and oral hygiene seated at sink. Pts dtr was cleared yesterday by PT to independently assist pt with transfers as needed.     Therapy Documentation Precautions:  Precautions Precautions:  Fall Restrictions Weight Bearing Restrictions: No    Therapy/Group: Individual Therapy  Ezekiel Slocumb 03/26/2022, 8:54 AM

## 2022-03-26 NOTE — IPOC Note (Signed)
Overall Plan of Care [facility]) Patient Details Name: Jessica [name] MRN: [id number] DOB: 02/23/1941  Admitting Diagnosis: Left thalamic infarction [facility])  Hospital Problems: Principal Problem:   Left thalamic infarction [facility])     Functional Problem List: Nursing Bowel, Medication Management, Safety, Endurance  PT Balance, Motor, Safety, Endurance, Perception, Pain, Sensory  OT Balance, Pain, Behavior, Perception, Cognition, Safety, Endurance, Motor  SLP Motor, Cognition  TR         Basic ADL's: OT Grooming, Bathing, Dressing, Toileting, Eating     Advanced  ADL's: OT Simple Meal Preparation     Transfers: PT Bed Mobility, Bed to Chair, Car  OT Toilet, Tub/Shower     Locomotion: PT Ambulation, Stairs     Additional Impairments: OT    SLP Swallowing, Social Cognition   Problem Solving, Memory, Attention, Awareness  TR      Anticipated Outcomes Item Anticipated Outcome  Self Feeding Indpependent  Swallowing  Min A   Basic self-care  set up assistance  Toileting  supervision   Bathroom Transfers supervision  Bowel/Bladder  manage bowel w mod I assist  Transfers  Supervision with LRAD  Locomotion  Supervision with LRAD  Communication  Min A  Cognition  Min A  Pain  n/a  Safety/Judgment  maintain safety w cues   Therapy Plan: PT Intensity: Minimum of 1-2 x/day ,45 to 90 minutes PT Frequency: 5 out of 7 days PT Duration Estimated Length of Stay: 7-9 days OT Intensity: Minimum of 1-2 x/day, 45 to 90 minutes OT Frequency: 5 out of 7 days OT Duration/Estimated Length of Stay: 7-10 days SLP Intensity: Minumum of 1-2 x/day, 30 to 90 minutes SLP Frequency: 3 to 5 out of 7 days SLP Duration/Estimated Length of Stay: 7-9 days   Team Interventions: Nursing Interventions Disease Management/Prevention, Medication Management, Discharge Planning, Bowel Management, Patient/Family Education  PT interventions Ambulation/gait training, Human resources officer, Cognitive remediation/compensation, Community reintegration, Discharge planning, Disease management/prevention, DME/adaptive equipment instruction, Functional mobility training, Neuromuscular re-education, Pain management, Patient/family education, Psychosocial support, Skin care/wound management, Splinting/orthotics, Stair training, Therapeutic Activities, Therapeutic Exercise, UE/LE Strength taining/ROM, UE/LE Coordination activities, Visual/perceptual remediation/compensation, Wheelchair propulsion/positioning  OT Interventions Balance/vestibular training, Discharge planning, Pain management, Self Care/advanced ADL retraining, Therapeutic Activities, UE/LE Coordination activities, Cognitive remediation/compensation, Functional mobility training, Patient/family education, Therapeutic Exercise, Visual/perceptual remediation/compensation, DME/adaptive equipment instruction, Neuromuscular re-education, UE/LE Strength taining/ROM  SLP Interventions Dysphagia/aspiration precaution training, Functional tasks, Patient/family education, Therapeutic Activities, Environmental controls (speech intelligibility strategies)  TR Interventions    SW/CM Interventions Discharge Planning, Psychosocial Support, Patient/Family Education   Barriers to Discharge MD  Medical stability  Nursing Home environment access/layout, Lack of/limited family support 1 level 1/2 ste solo; dtr will check in  PT Home environment access/layout, Decreased caregiver support, Lack of/limited family support, Insurance underwriter for SNF coverage    OT Inaccessible home environment, Behavior Appears to have excellent family support, question depression and anxiety, has stairs to enter home  SLP      SW Insurance for SNF coverage     Team Discharge Planning: Destination: PT-Home ,OT- Home (with family support) , SLP-Home Projected Follow-up: PT-Home health PT, 24 hour supervision/assistance, OT-  Outpatient OT, SLP-Outpatient SLP, Home  Health SLP, 24 hour supervision/assistance Projected Equipment Needs: PT-To be determined, OT- 3 in 1 bedside comode, Tub/shower seat, Tub/shower bench, SLP-None recommended by SLP Equipment Details: PT- , OT-will depend on d/c destination - daughter's home - tub transfer bench, patient's home - shower  seat Patient/family involved in discharge planning: PT- Patient,  Family member/caregiver (Daughter - [name]),  OT-Patient, Family member/caregiver, SLP-Patient, Family member/caregiver  MD ELOS:  Medical Rehab Prognosis:  Excellent Assessment: The patient has been admitted for CIR therapies with the diagnosis of left thalamic infarction. The team will be addressing functional mobility, strength, stamina, balance, safety, adaptive techniques and equipment, self-care, bowel and bladder mgt, patient and caregiver education. Goals have been set at supervision. Anticipated discharge destination is home.         See Team Conference Notes for weekly updates to the plan of care

## 2022-03-27 ENCOUNTER — Inpatient Hospital Stay (HOSPITAL_COMMUNITY): Payer: Medicare Other

## 2022-03-27 MED ORDER — AMLODIPINE BESYLATE 5 MG PO TABS
5.0000 mg | ORAL_TABLET | Freq: Once | ORAL | Status: AC
  Administered 2022-03-27: 5 mg via ORAL

## 2022-03-27 MED ORDER — MAGNESIUM GLUCONATE 500 MG PO TABS
500.0000 mg | ORAL_TABLET | Freq: Every day | ORAL | Status: DC
Start: 2022-03-27 — End: 2022-03-31
  Administered 2022-03-27 – 2022-03-30 (×4): 500 mg via ORAL
  Filled 2022-03-27 (×4): qty 1

## 2022-03-27 MED ORDER — POTASSIUM CHLORIDE 20 MEQ PO PACK
40.0000 meq | PACK | Freq: Every day | ORAL | Status: DC
  Administered 2022-03-28 – 2022-03-30 (×3): 40 meq via ORAL
  Filled 2022-03-27 (×3): qty 2

## 2022-03-27 MED ORDER — MAGNESIUM HYDROXIDE 400 MG/5ML PO SUSP
30.0000 mL | Freq: Every day | ORAL | Status: DC | PRN
  Administered 2022-03-28: 30 mL via ORAL
  Filled 2022-03-27: qty 30

## 2022-03-27 MED ORDER — AMLODIPINE BESYLATE 10 MG PO TABS
10.0000 mg | ORAL_TABLET | Freq: Every day | ORAL | Status: DC
Start: 2022-03-28 — End: 2022-03-31
  Administered 2022-03-28 – 2022-03-31 (×4): 10 mg via ORAL
  Filled 2022-03-27 (×4): qty 1

## 2022-03-27 NOTE — Discharge Summary (Signed)
Physician Discharge Summary  Patient ID: Jessica [name] MRN: [id number] DOB/AGE: October 01, 1941 81 y.o.  Admit date: 03/23/2022 Discharge date: 03/31/2022  Discharge Diagnoses:  Principal Problem:   Left thalamic infarction [facility]) DVT prophylaxis Mood stabilization Hypertension Hypothyroidism Hyperlipidemia Constipation GERD History of breast cancer Suspect OSA Obesity  Discharged Condition: Stable  Significant Diagnostic Studies: CT ANGIO HEAD NECK W WO CM  Result Date: 03/21/2022 CLINICAL DATA:  Stroke follow-up EXAM: CT ANGIOGRAPHY HEAD AND NECK TECHNIQUE: Multidetector CT imaging of the head and neck was performed using the standard protocol during bolus administration of intravenous contrast. Multiplanar CT image reconstructions and MIPs were obtained to evaluate the vascular anatomy. Carotid stenosis measurements (when applicable) are obtained utilizing NASCET criteria, using the distal internal carotid diameter as the denominator. RADIATION DOSE REDUCTION: This exam was performed according to the departmental dose-optimization program which includes automated exposure control, adjustment of the mA and/or kV according to patient size and/or use of iterative reconstruction technique. CONTRAST:  32m OMNIPAQUE IOHEXOL 350 MG/ML SOLN COMPARISON:  Head CT 03/20/2022 FINDINGS: CTA NECK FINDINGS SKELETON: There is no bony spinal canal stenosis. No lytic or blastic lesion. OTHER NECK: Normal pharynx, larynx and major salivary glands. No cervical lymphadenopathy. Unremarkable thyroid gland. UPPER CHEST: No pneumothorax or pleural effusion. No nodules or masses. AORTIC ARCH: There is calcific atherosclerosis of the aortic arch. There is no aneurysm, dissection or hemodynamically significant stenosis of the visualized portion of the aorta. Conventional 3 vessel aortic branching pattern. The visualized proximal subclavian arteries are widely patent. RIGHT CAROTID SYSTEM: Normal without aneurysm,  dissection or stenosis. LEFT CAROTID SYSTEM: Normal without aneurysm, dissection or stenosis. VERTEBRAL ARTERIES: Left dominant configuration. Both origins are clearly patent. There is no dissection, occlusion or flow-limiting stenosis to the skull base (V1-V3 segments). CTA HEAD FINDINGS POSTERIOR CIRCULATION: --Vertebral arteries: Normal V4 segments. --Inferior cerebellar arteries: Normal. --Basilar artery: Normal. --Superior cerebellar arteries: Normal. --Posterior cerebral arteries (PCA): Mild atherosclerotic irregularity of the right PCA ANTERIOR CIRCULATION: --Intracranial internal carotid arteries: Normal. --Anterior cerebral arteries (ACA): Normal. Both A1 segments are present. Patent anterior communicating artery (a-comm). --Middle cerebral arteries (MCA): Normal. VENOUS SINUSES: As permitted by contrast timing, patent. ANATOMIC VARIANTS: None Review of the MIP images confirms the above findings. IMPRESSION: 1. No emergent large vessel occlusion or high-grade stenosis of the intracranial or cervical arteries. 2. Aortic Atherosclerosis (ICD10-I70.0). Electronically Signed   By: KUlyses JarredM.D.   On: 03/21/2022 03:48   DG Abd 1 View  Result Date: 03/30/2022 CLINICAL DATA:  Abdominal distension. EXAM: ABDOMEN - 1 VIEW COMPARISON:  None Available. FINDINGS: Normal bowel gas pattern. There is no bowel dilation to suggest obstruction or significant adynamic ileus. Soft tissues are poorly defined. Linear radiopaque object projects over the right lower quadrant that may be superficial to this patient. No convincing renal or ureteral stone. No acute skeletal abnormality. IMPRESSION: 1. No acute findings. No evidence of bowel obstruction or significant adynamic ileus. Electronically Signed   By: DLajean ManesM.D.   On: 03/30/2022 13:14   CT Head Wo Contrast  Result Date: 03/20/2022 CLINICAL DATA:  Altered mental status and right facial droop EXAM: CT HEAD WITHOUT CONTRAST TECHNIQUE: Contiguous axial  images were obtained from the base of the skull through the vertex without intravenous contrast. RADIATION DOSE REDUCTION: This exam was performed according to the departmental dose-optimization program which includes automated exposure control, adjustment of the mA and/or kV according to patient size and/or use of iterative reconstruction technique. COMPARISON:  12/31/2021  FINDINGS: Brain: Chronic atrophic changes are noted. Old lacunar infarct in the right internal capsule, right basal ganglia and adjacent to the head of the caudate nucleus is seen. No acute hemorrhage or acute infarction is noted. Vascular: No hyperdense vessel or unexpected calcification. Skull: Normal. Negative for fracture or focal lesion. Sinuses/Orbits: No acute finding. Other: None. IMPRESSION: Chronic ischemic change without acute abnormality. Electronically Signed   By: Inez Catalina M.D.   On: 03/20/2022 22:36   MR Brain W and Wo Contrast  Result Date: 03/21/2022 CLINICAL DATA:  Slurred speech EXAM: MRI HEAD WITHOUT AND WITH CONTRAST TECHNIQUE: Multiplanar, multiecho pulse sequences of the brain and surrounding structures were obtained without and with intravenous contrast. CONTRAST:  71m GADAVIST GADOBUTROL 1 MMOL/ML IV SOLN COMPARISON:  10/19/2020 FINDINGS: Brain: There is a small focus of acute ischemia within the left caudate body. Old right basal ganglia small vessel infarct. No acute or chronic hemorrhage. There is multifocal hyperintense T2-weighted signal within the white matter. Generalized cerebral volume loss. The midline structures are normal. There is no abnormal contrast enhancement. Vascular: Major flow voids are preserved. Skull and upper cervical spine: Normal calvarium and skull base. Visualized upper cervical spine and soft tissues are normal. Sinuses/Orbits:No paranasal sinus fluid levels or advanced mucosal thickening. No mastoid or middle ear effusion. Normal orbits. IMPRESSION: 1. Small focus of acute ischemia  within the left caudate body. No hemorrhage or mass effect. 2. Old right basal ganglia small vessel infarct and findings of chronic microvascular disease. Electronically Signed   By: KUlyses JarredM.D.   On: 03/21/2022 02:06   DG Swallowing Func-Speech Pathology  Result Date: 03/27/2022 Table formatting from the original result was not included. Objective Swallowing Evaluation: Type of Study: Bedside Swallow Evaluation  Patient Details Name: Jessica NATERMRN: [id number] of Birth: [birth year]Today's Date: 03/27/2022 Time: SLP Start Time (ACUTE ONLY): 1218 -SLP Stop Time (ACUTE ONLY): 11224SLP Time Calculation (min) (ACUTE ONLY): 17 min Past Medical History: Past Medical History: Diagnosis Date  Allergy   Anemia   Anxiety   Asthma   in past, no inhalers now  Blood transfusion without reported diagnosis   [place]  left breast   Breast cancer of upper-inner quadrant of left female breast [place] 03[place]11[place]  [place]  left breast  Cataract   bilateral removed  Chronic kidney disease   kidney stones  Depression 08/31/2013  Dyslipidemia, goal LDL below 160 08/31/2013  Essential hypertension - well controlled 08/31/2013  Family history of breast cancer   Family history of melanoma   Family history of prostate cancer   GERD (gastroesophageal reflux disease)   Goals of care, counseling/discussion 03/11/2017  History of radiation therapy 10/13/17-11/11/17  left breast 40.05 Gy in 15 fractions, left breast boost 10 Gy in 5 fractions  Hypothyroidism   Obesity (BMI 30-39.9) 08/31/2013  Personal history of chemotherapy 2018  Personal history of radiation therapy 2019  Thyroid disease   Vasovagal near-syncope -rare 08/31/2013 Past Surgical History: Past Surgical History: Procedure Laterality Date  ABDOMINAL HYSTERECTOMY    total  BREAST BIOPSY Left 02/24/2017   malignant  BREAST LUMPECTOMY Left 03/23/2017  broken fingers    CATARACT EXTRACTION Bilateral   COLONOSCOPY    EXCISIONAL  HEMORRHOIDECTOMY    IR FLUORO GUIDE PORT INSERTION RIGHT  08/04/2017  IR REMOVAL TUN ACCESS W/ PORT W/O FL MOD SED  08/04/2017  IR [place] VASC ACC[place] RIGHT  08/04/2017  IRRIGATION AND DEBRIDEMENT ABSCESS Left  05/18/2017  Procedure: IRRIGATION AND DEBRIDEMENT LEFT AXILLARY ABSCESS;  Surgeon: Michael Boston, MD;  Location: [facility];  Service: General;  Laterality: Left;  kidney stones lithotripsy    PORTACATH PLACEMENT Right 03/23/2017  Procedure: INSERTION PORT-A-CATH;  Surgeon: Erroll Luna, MD;  Location: [facility];  Service: General;  Laterality: Right;  RADIOACTIVE SEED GUIDED PARTIAL MASTECTOMY WITH AXILLARY SENTINEL LYMPH NODE BIOPSY Left 03/23/2017  Procedure: LEFT BREAST RADIOACTIVE SEED GUIDED PARTIAL MASTECTOMY AND  SENTINEL LYMPH NODE [place];  Surgeon: Erroll Luna, MD;  Location: [facility];  Service: General;  Laterality: Left;  ROTATOR CUFF REPAIR    left   TONSILLECTOMY    WISDOM TOOTH EXTRACTION   HPI: Jessica [name] is a 81 y.o. female who  presented to the emergency room on 03/20/2022 for evaluation of confusion and weakness. MRI revealed a left caudate/external capsule stroke. 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  No data recorded  Recommendations for follow up therapy are one component of a multi-disciplinary discharge planning process, led by the attending physician.  Recommendations may be updated based on patient status, additional functional criteria and insurance authorization. Assessment / Plan / Recommendation   03/27/2022   3:02 PM Clinical Impressions SLP Visit Diagnosis MBSS completed. Pt presents with mild oropharyngeal dysphagia c/b decreased bolus cohesion and prolonged oral transit, which results in premature spillage that falls into the pyriform sinuses before swallow initiation is observed. This results in flash, shallow penetration  with large sips of thin liquid via cup; to the level of the TVF's with large straw sips. Nevertheless, no stagnant penetration nor aspiration observed across consistencies. Please see below for diet recommendations and details re: oropharyngeal impairments appreciated. Dysphagia, oropharyngeal phase (R13.12) Impact on safety and function Mild aspiration risk     03/22/2022  10:35 AM Treatment Recommendations Treatment Recommendations Therapy as outlined in treatment plan below     03/27/2022   3:02 PM Prognosis Prognosis for Safe Diet Advancement Good   03/27/2022   3:02 PM Diet Recommendations SLP Diet Recommendations Dysphagia 3 (Mech soft) solids;Thin liquid Liquid Administration via Cup Compensations Slow rate;Small sips/bites;Lingual sweep for clearance of pocketing;Minimize environmental distractions;Follow solids with liquid Postural Changes Seated upright at 90 degrees;Remain semi-upright after after feeds/meals (Comment)     03/27/2022   3:02 PM Other Recommendations Oral Care Recommendations Oral care BID   03/27/2022   3:02 PM Frequency and Duration  Treatment Duration 2 weeks     03/27/2022   2:59 PM Oral Phase Oral Phase Impaired Oral - Thin Teaspoon NT Oral - Thin Cup Decreased bolus cohesion;Premature spillage Oral - Thin Straw Decreased bolus cohesion;Premature spillage Oral - Puree Delayed oral transit Oral - Mech Soft Impaired mastication;Delayed oral transit;Decreased bolus cohesion Oral - Regular NT Oral - Multi-Consistency Decreased bolus cohesion;Premature spillage Oral - Pill NT    03/27/2022   3:00 PM Pharyngeal Phase Pharyngeal Phase Impaired Pharyngeal- Thin Teaspoon NT Pharyngeal- Thin Cup Delayed swallow initiation-pyriform sinuses;Reduced airway/laryngeal closure Pharyngeal Material enters airway, remains ABOVE vocal cords then ejected out Pharyngeal- Thin Straw Delayed swallow initiation-pyriform sinuses;Reduced airway/laryngeal closure Pharyngeal Material enters airway, CONTACTS cords and  then ejected out Pharyngeal- Puree WFL Pharyngeal Material does not enter airway Pharyngeal- Mechanical Soft WFL Pharyngeal Material does not enter airway Pharyngeal- Regular NT Pharyngeal- Multi-consistency Delayed swallow initiation-pyriform sinuses Pharyngeal Material does not enter airway Pharyngeal- Pill NT  03/27/2022   3:02 PM Cervical Esophageal Phase  Cervical Esophageal Phase WFL Bethany A Lutes 03/27/2022, 3:04 PM                     ECHOCARDIOGRAM COMPLETE BUBBLE STUDY  Result Date: 03/21/2022    ECHOCARDIOGRAM REPORT   Patient Name:   Jessica [name] Date of Exam: 03/21/2022 Medical Rec #:  [id number]         Height:       63.0 in Accession #:    [id number]        Weight:       180.0 lb Date of Birth:  [birth year]        BSA:          1.849 m Patient Age:    18 years          BP:           169/72 mmHg Patient Gender: F                 HR:           69 bpm. Exam Location:  Inpatient Procedure: 2D Echo, Cardiac Doppler, Color Doppler and Saline Contrast Bubble            Study Indications:    Stroke 434.91 / I63.9  History:        Patient has prior history of Echocardiogram examinations, most                 recent 04/21/2012. Risk Factors:Hypertension, Diabetes and                 Dyslipidemia. Chronic kidney disease. Cancer. Thyroid disease.                 GERD.  Sonographer:    Darlina Sicilian RDCS Referring Phys: [id number] [facility]  Sonographer Comments: Suboptimal parasternal window and no apical window. IMPRESSIONS  1. Left ventricular ejection fraction, by estimation, is 60 to 65%. The left ventricle has normal function. The left ventricle has no regional wall motion abnormalities.  2. Right ventricular systolic function is normal. The right ventricular size is normal.  3. Agtated saline injected There was no large R to L shunt noted but with acoustic windows being so poor, cannot exclude tiny shunt.  4. The mitral valve is normal in structure. Trivial mitral valve regurgitation.  5. The  aortic valve is tricuspid. Aortic valve regurgitation is not visualized. Aortic valve sclerosis is present, with no evidence of aortic valve stenosis. FINDINGS  Left Ventricle: Left ventricular ejection fraction, by estimation, is 60 to 65%. The left ventricle has normal function. The left ventricle has no regional wall motion abnormalities. The left ventricular internal cavity size was normal in size. There is  no left ventricular hypertrophy. Right Ventricle: The right ventricular size is normal. Right vetricular wall thickness was not assessed. Right ventricular systolic function is normal. Left Atrium: Left atrial size was normal in size. Right Atrium: Right atrial size was normal in size. Pericardium: Trivial pericardial effusion is present. Mitral Valve: The mitral valve is normal in structure. Mild mitral annular calcification. Trivial mitral valve regurgitation. Tricuspid Valve: The tricuspid valve is normal in structure. Tricuspid valve regurgitation is trivial. Aortic Valve: The aortic valve is tricuspid. Aortic valve regurgitation is not visualized. Aortic valve sclerosis is present, with no evidence of aortic valve stenosis. Pulmonic Valve: The pulmonic valve was not well visualized. Pulmonic valve regurgitation is not visualized.  Aorta: The aortic root is normal in size and structure. IAS/Shunts: Agitated saline contrast was given intravenously to evaluate for intracardiac shunting.  LEFT VENTRICLE PLAX 2D LVIDd:         5.10 cm   Diastology LVIDs:         3.10 cm   LV e' medial:    5.00 cm/s LV PW:         0.90 cm   LV E/e' medial:  9.2 LV IVS:        0.80 cm   LV e' lateral:   4.05 cm/s LVOT diam:     1.90 cm   LV E/e' lateral: 11.4 LV SV:         37 LV SV Index:   20 LVOT Area:     2.84 cm  RIGHT VENTRICLE RV S prime:     17.00 cm/s TAPSE (M-mode): 1.8 cm LEFT ATRIUM         Index       RIGHT ATRIUM           Index LA diam:    3.90 cm 2.11 cm/m  RA Area:     13.60 cm                                  RA Volume:   30.40 ml  16.44 ml/m  AORTIC VALVE LVOT Vmax:   72.70 cm/s LVOT Vmean:  50.800 cm/s LVOT VTI:    0.130 m  AORTA Ao Root diam: 3.30 cm MITRAL VALVE MV Area (PHT): 2.62 cm    SHUNTS MV Decel Time: 289 msec    Systemic VTI:  0.13 m MV E velocity: 46.10 cm/s  Systemic Diam: 1.90 cm MV A velocity: 64.50 cm/s MV E/A ratio:  0.71 Dorris Carnes MD Electronically signed by Dorris Carnes MD Signature Date/Time: 03/21/2022/3:04:16 PM    Final     Labs:  Basic Metabolic Panel: Recent Labs  Lab 03/24/22 1419 03/26/22 0955 03/30/22 0524  NA 136 140  --   K 3.1* 3.5  --   CL 101 102  --   CO2 28 30  --   GLUCOSE 150* 112*  --   BUN 6* 11  --   CREATININE 0.87 0.82 0.74  CALCIUM 8.9 9.4  --     CBC: Recent Labs  Lab 03/24/22 1419  WBC 5.5  NEUTROABS 3.7  HGB 12.4  HCT 38.5  MCV 102.9*  PLT 249    CBG: No results for input(s): GLUCAP in the last 168 hours.  Family history.  Mother with angina father with CAD Brother with prostate cancer.  Paternal aunt with breast cancer.  Negative for colon cancer pancreatic cancer stomach cancer or rectal cancer  Brief HPI:   Jessica [name] is a 81 y.o. right-handed female with history of hypertension hyperlipidemia hypothyroidism asthma depression/anxiety left breast cancer status postmastectomy 2018 as well as radiation therapy, obesity with BMI 31.89.  Per chart review lives alone.  Independent prior to admission.  Presented 03/20/2022 with right-sided weakness and slurred speech with altered mental status and facial droop of acute onset.  CT/MRI showed small focus of acute ischemia within the left caudate body.  No hemorrhage or mass effect.  Old right basal ganglia small vessel infarct and findings of chronic microvascular disease.  Patient did not receive tPA.  CT angiogram head and neck no emergent large vessel occlusion.  Echocardiogram ejection fraction of 60 to 65% no wall motion abnormalities.  Admission chemistries unremarkable except  potassium 3.3, urine drug screen negative.  Presently maintained on aspirin and Plavix for CVA prophylaxis x3 months then aspirin alone.  Lovenox for DVT prophylaxis.  Therapy evaluations completed due to patient decreased functional mobility was admitted for a comprehensive rehab program.   Hospital Course: Jessica [name] was admitted to rehab 03/23/2022 for inpatient therapies to consist of PT, ST and OT at least three hours five days a week. Past admission physiatrist, therapy team and rehab RN have worked together to provide customized collaborative inpatient rehab.  Pertaining to patient's left caudate body infarction remains stable she will continue on aspirin and Plavix x3 months then aspirin alone.  Mood stabilization with the use of Wellbutrin Effexor as well as Remeron with emotional support provided.  Maintain on Lovenox for DVT prophylaxis no bleeding episodes.  Blood pressure controlled on Norvasc's as well as HCTZ and would need outpatient follow-up.  Synthroid for hypothyroidism.  Lipitor ongoing for hyperlipidemia.  Bouts of constipation resolved with laxative assistance.  Patient did have a history of breast cancer with mastectomy radiation therapy 2018 follow-up outpatient.  Noted obesity with BMI 31.89 dietary follow-up.  There was some question of OSA ambulatory referral obtained with pulmonary services to establish and evaluate.   Blood pressures were monitored on TID basis and controlled     Rehab course: During patient's stay in rehab weekly team conferences were held to monitor patient's progress, set goals and discuss barriers to discharge. At admission, patient required mod max assist 5 feet 1 person hand-held assist mod assist sit to stand  Physical exam.  Blood pressure 175/74 pulse 62 temperature 98.3 respirations 18 oxygen saturation 93% room air Constitutional.  No acute distress HEENT Head.  Normocephalic and atraumatic Eyes.  Pupils round and reactive to light no  discharge nystagmus Neck.  Supple nontender no JVD without thyromegaly Cardiac regular rate rhythm at a Eksir sounds or murmur heard Abdomen.  Soft nontender positive bowel sounds without rebound Respiratory effort normal no respiratory distress without wheeze Skin.  Intact Neurologic.  Alert makes eye contact with examiner.  Speech was mildly dysarthric but intelligible.  Provides name and age follow simple commands.  Provides biographical information and address.  Right central 7.  Right upper extremity 4/5 with decreased [place].  Left upper extremity 4+ to 5/5.  Right lower extremity 2+/5 proximal to 4/5 distally, left lower extremity 3/5 proximal to 4+/5 distally.  Since his pain.  DTRs 1+.  He/She  has had improvement in activity tolerance, balance, postural control as well as ability to compensate for deficits. He/She has had improvement in functional use RUE/LUE  and RLE/LLE as well as improvement in awareness.  Ambulates 145 feet contact-guard rolling walker.  Minimal assist required for some loss of balance.  Contact-guard/min assist provided for posterior lean while navigating steps.  Completed amatory car transfer contact-guard.  Completed tub shower transfer using tub bench without backrest wide min assist for sitting for tub bench and minimal assist for scooting.  Doffed pants and underwear at sit to stand level with increased time and contact-guard.  Doffed socks with max assist primarily due to tight space and shower stall limiting mobility.  Doffed shirt with contact-guard.  Bathe upper body using a long handled sponge as needed with close supervision.  Bathing lower body sit to stand level contact-guard.  Full family teaching completed plan discharge to home  Disposition: Discharge to home    Diet: Mechanical soft  Special Instructions: No driving smoking or alcohol  Medications at discharge 1.  Tylenol as needed 2.  Norvasc 5 mg p.o. daily 3.  Aspirin 81 mg p.o. daily 4.   Lipitor 80 mg p.o. daily 5.  Wellbutrin 150 mg p.o. daily 6.  Plavix 75 mg p.o. daily until June 24, 2022 and stop 7.  Voltaren gel 2 g 4 times a day to affected area 8.  HCTZ 12.5 mg p.o. daily 9.  Synthroid 75 mcg p.o. daily 10.  Magnesium gluconate 250 mg p.o. nightly 11.  Remeron 7.5 mg p.o. nightly 12.  Provigil 100 mg p.o. daily 13.  Protonix 40 mg p.o. daily 14.  MiraLAX daily hold for loose stools 15.  Klor-Con 10 meq poTID  16.  Effexor 225 mg p.o. daily  30-35 minutes were spent completing discharge summary and discharge planning  Discharge Instructions     Ambulatory referral to Neurology   Complete by: As directed    An appointment is requested in approximately: 4 weeks left caudate body infarction   Ambulatory referral to Physical Medicine Rehab   Complete by: As directed    Moderate complexity follow-up 1 to 2 weeks left caudate body infarction   Ambulatory referral to Pulmonology   Complete by: As directed    Evaluate for OSA   Reason for referral: Other        Follow-up Information     Raulkar, Clide Deutscher, MD Follow up.   Specialty: Physical Medicine and Rehabilitation Why: Office to call for appointment Contact information: [address] [place] [address] [phone]                 Signed: Lavon Paganini Jennings 03/31/2022, 5:11 AM

## 2022-03-27 NOTE — Progress Notes (Signed)
Patient ID: Jessica [name], female   DOB: Dec 06, 1940, 81 y.o.   MRN: [id number]  Met with pt and daughter-karen to answer questions regarding tub bench and now need for hospital bed. Have made referral for hospital bed. Gave daughter private duty list in case this is needed. Aware pt will need 24/7 supervision at discharge. Pt going down for swallowing test hopefully she will get upgraded to thin, will await results.

## 2022-03-27 NOTE — Progress Notes (Signed)
Occupational Therapy Session Note  Patient Details  Name: Jessica [name] MRN: [id number] Date of Birth: January 15, 1941  Today's Date: 03/27/2022 OT Individual Time: 0258-5277 OT Individual Time Calculation (min): 78 min    Short Term Goals: Week 1:  OT Short Term Goal 1 (Week 1): Patient will stand with no more than single UE support and intermittent min assistance for LB dressing and/or toileting OT Short Term Goal 2 (Week 1): Patient will demonstrate sufficient range of motion and activity tolerance to use BUE to shampoo her hair in the shower OT Short Term Goal 3 (Week 1): Patient will dress herself (excluding bra) with min assistance OT Short Term Goal 4 (Week 1): Patient will complete toilet hygiene with min assistance  Skilled Therapeutic Interventions/Progress Updates:    Subjective: [name] dtr present and concerned regarding presenting with incontinence this morning and soiled linens; also concerned about pt not receiving breakfast in timely manner this am and requesting pt be sitting up in w/c during all meals for increased independence and safety.  Made nurse aware of nursing concerns and provided written instruction for staff to sit pt up during meals.  Dtr also requesting to modify therapy schedule to reduce overlap of meal time with therapy.  This therapist requested schedule modifications via email to Web designer.  Pts dtr also asking this therapist regarding shower bench recommendation stating [employer] company told her that they were providing bench without back.  This therapist confirmed with CSW that bench should have back included.    Objective:  Pt sitting in w/c finishing up breakfast.  Pt requesting to wash up and change clothes at sink.  Pt completed UB bathing and dressing with supervision needing min verbal cues for problem solving, decision making, and organizing.  Pt completed LB dressing and bathing at sit<>stand level with CGA.  Pt required significantly  increased time for processing throughout self care and educated pts dtr regarding benefits of allowing pt extra time to complete tasks more independently.  Direct hand off to nurse.     Assessment:  Pt making progress evidenced by decreased posterior bias during static and dynamic standing.     Plan: Pt would benefit from family education and training.   Therapy Documentation Precautions:  Precautions Precautions: Fall Restrictions Weight Bearing Restrictions: No    Therapy/Group: Individual Therapy  Ezekiel Slocumb 03/27/2022, 12:04 PM

## 2022-03-27 NOTE — Progress Notes (Signed)
PROGRESS NOTE   Subjective/Complaints: BP still elevated- will increase amlodipine to '10mg'$  and supplement potassium and magnesium. Discussed goal BP with her Daughter asks for information for 24/7 supervision  ROS: +intermittent shoulder pain, +expressive aphasia, +fatigue, +dysarthria   Objective:   No results found. Recent Labs    03/24/22 1419  WBC 5.5  HGB 12.4  HCT 38.5  PLT 249   Recent Labs    03/24/22 1419 03/26/22 0955  NA 136 140  K 3.1* 3.5  CL 101 102  CO2 28 30  GLUCOSE 150* 112*  BUN 6* 11  CREATININE 0.87 0.82  CALCIUM 8.9 9.4    Intake/Output Summary (Last 24 hours) at 03/27/2022 1142 Last data filed at 03/27/2022 0842 Gross per 24 hour  Intake 600 ml  Output --  Net 600 ml        Physical Exam: Vital Signs Blood pressure (!) 180/62, pulse 70, temperature 98.2 F (36.8 C), resp. rate 16, height '5\' 3"'$  (1.6 m), weight 81.5 kg, SpO2 93 %. Gen: no distress, normal appearing, yawning, fatigued, BMI 31.83 HEENT: oral mucosa pink and moist, NCAT Cardio: Reg rate Chest: normal effort, normal rate of breathing Abd: soft, non-distended Ext: no edema Psych: pleasant, normal affect Skin:    General: Skin is warm and dry.     Comments: Left mastectomy scar  Neurological:     Comments: Patient is alert.  Makes eye contact with examiner.  Speech is mildly dysarthric but intelligible. Expressive aphasia.  Provides name and age.  Follows simple commands. Provides biographical information/address. Right central 7. RUE 4/5 with decreased [place]. LUE 4+ to 5/5. RLE 2+/5 prox to 4/5 distally, LLE 3/5 prox to 4+/5 distally. Senses pain and light touch in all 4's. DTR's 1+. No abnl resting tone.   Psychiatric:     Comments: Flat, cooperative    Assessment/Plan: 1. Functional deficits which require 3+ hours per day of interdisciplinary therapy in a comprehensive inpatient rehab setting. Physiatrist is  providing close team supervision and 24 hour management of active medical problems listed below. Physiatrist and rehab team continue to assess barriers to discharge/monitor patient progress toward functional and medical goals  Care Tool:  Bathing    Body parts bathed by patient: Right arm, Left arm, Chest, Abdomen, Right upper leg, Left upper leg, Face   Body parts bathed by helper: Front perineal area, Buttocks, Right lower leg, Left lower leg     Bathing assist Assist Level: Moderate Assistance - Patient 50 - 74%     Upper Body Dressing/Undressing Upper body dressing   What is the patient wearing?: Bra, Pull over shirt    Upper body assist Assist Level: Maximal Assistance - Patient 25 - 49%    Lower Body Dressing/Undressing Lower body dressing      What is the patient wearing?: Underwear/pull up, Pants     Lower body assist Assist for lower body dressing: Moderate Assistance - Patient 50 - 74%     Toileting Toileting    Toileting assist Assist for toileting: Moderate Assistance - Patient 50 - 74%     Transfers Chair/bed transfer  Transfers assist     Chair/bed transfer assist  level: Minimal Assistance - Patient > 75%     Locomotion Ambulation   Ambulation assist      Assist level: Minimal Assistance - Patient > 75% Assistive device: Walker-rolling Max distance: 39f   Walk 10 feet activity   Assist     Assist level: Minimal Assistance - Patient > 75% Assistive device: Walker-rolling   Walk 50 feet activity   Assist Walk 50 feet with 2 turns activity did not occur: Safety/medical concerns (fatigue)         Walk 150 feet activity   Assist Walk 150 feet activity did not occur: Safety/medical concerns         Walk 10 feet on uneven surface  activity   Assist Walk 10 feet on uneven surfaces activity did not occur: Safety/medical concerns         Wheelchair     Assist Is the patient using a wheelchair?: Yes Type of  Wheelchair: Manual    Wheelchair assist level: Dependent - Patient 0%      Wheelchair 50 feet with 2 turns activity    Assist        Assist Level: Dependent - Patient 0%   Wheelchair 150 feet activity     Assist      Assist Level: Dependent - Patient 0%   Blood pressure (!) 180/62, pulse 70, temperature 98.2 F (36.8 C), resp. rate 16, height '5\' 3"'$  (1.6 m), weight 81.5 kg, SpO2 93 %.  Medical Problem List and Plan: 1. Functional deficits secondary to acute ischemic infarct left caudate body.             -patient may shower             -ELOS/Goals: 7-10 days, mod I goals with PT, OT, SLP  Continue CIR 2.  Antithrombotics: -DVT/anticoagulation:  Pharmaceutical: Lovenox             -antiplatelet therapy: Aspirin 81 mg daily and Plavix 75 mg day x3 months then aspirin alone as noted by neurology services Dr.Palikh 3. Shoulder pain: PRN voltaren gel added Tylenol as needed 4. Mood: Wellbutrin 150 mg daily, Effexor 225 mg daily, Remeron 7.5 mg nightly             -antipsychotic agents: N/A             -team to provide ego support as needed 5. Neuropsych: This patient is capable of making decisions on her own behalf. 6. Skin/Wound Care: Routine skin checks 7. Fluids/Electrolytes/Nutrition: Routine in and outs with follow-up chemistries 8.  Hypertension.  Hydrochlorothiazide 12.5 mg daily added tday.  Monitor with increased mobility. Increase amlodipine to '10mg'$  Discontinue TEDs.  9.  Hypothyroidism.  Synthroid 10.  Hyperlipidemia.  Lipitor. Pt with history of statin intolerance? 11.  Constipation.  MiraLAX daily, Senokot-S daily. Magnesium gluconate '250mg'$  added HS 12.  GERD.  Protonix 13.  History of left breast cancer.  Status postmastectomy/radiation therapy 2018 14. Hyperglycemia: educated regarding choosing foods with low added sugar.  15. Hypokalemia: increase klor to 457m daily.  16. Daytime somnolence: Modafinil '100mg'$  daily started. Wait for improved blood  pressure control before increasing 17. OSA: refer for outpatient [facility] eval    LOS: 4 days A FACE TO FACE EVALUATION WAS PERFORMED  KrClide Deutscheraulkar 03/27/2022, 11:42 AM

## 2022-03-27 NOTE — Progress Notes (Signed)
Recreational Therapy Session Note  Patient Details  Name: Jessica [name] MRN: [id number] Date of Birth: [birth year] Today's Date: 03/27/2022  Pet Visitation Checklist provided to pts daughter per MD request.  [place] Bing 03/27/2022, 12:43 PM

## 2022-03-27 NOTE — Progress Notes (Signed)
Physical Therapy Session Note  Patient Details  Name: Jessica [name] MRN: [id number] Date of Birth: 20-Jul-1941  Today's Date: 03/27/2022 PT Individual Time: 1100-1200 + 1400-1430 PT Individual Time Calculation (min): 60 min  + 30 min  Short Term Goals: Week 1:  PT Short Term Goal 1 (Week 1): STG = LTG due to ELOS  Skilled Therapeutic Interventions/Progress Updates:     1st session: Pt supine in bed resting to start - awakens easily to voice and is agreeable to PT tx. Completes supine<>sitting EOB with minA for trunk support and sitting balance with CGA due to mild posterior leaning. Stand<>pivot transfer to w/c with minA for balance without using AD. Donned tennis shoes with totalA for time. Transported outside near [facility] to work on gait training outdoors to simulate community reintegration. She ambulated variable distances several times, ranging from ~54ft to ~165ft, all using the RW and CGA for safety. She had a few minor LOB while navigating uneven surfaces that requires minA for correction. She also required frequent cueing for increasing R step length and height but her action on improving was minimal. While outdoors, we also practiced park bench transfers and visual scanning of her environment while ambulating. Returned upstairs to [employer] and worked on cognitive problem solving and recall/memory using the [employer] system while sitting in w/c - able to recall up to 5 word verbal/visual but anything > 6 she was unable to do - she also required assist for forward weight shifting to reach to BITS screen to complete the task. Pt returned to her room in w/c for time - her daughter, [name], brought their family dog from home (team aware). Pt in bright spirits from this. Remained in w/c with all needs met, daughter at the bedside.   2nd session: Pt sitting in recliner finishing up lunch to start session. Daughter at bedside who reports her w/c cushion got soiled. Replaced this with a clean cushion and  placed dirty in laundry in ortho rehab gym. Sit<>stand to RW with minA and requiring cues for forward weight shifting to edge of seat - moderate posterior lean and difficulty initiating forward leaning. Ambulated to day room rehab gym, ~150ft, with CGA and RW. SetupA provided on the [employer] and completed a total of 7 minutes. Speed slowed and encouragement provided to increase cadence. Ambulated back to her room with CGA and RW and assisted to bed due to fatigue. minA needed for BLE management and daughter assisting with repositioning in bed. Bed lowered, all needs met, daughter at bedside.    Therapy Documentation Precautions:  Precautions Precautions: Fall Restrictions Weight Bearing Restrictions: No General:    Therapy/Group: Individual Therapy  Alger Simons 03/27/2022, 7:37 AM

## 2022-03-27 NOTE — Progress Notes (Signed)
Speech Language Pathology Daily Session Note  Patient Details  Name: Jessica [name] MRN: [id number] Date of Birth: [birth year]  Today's Date: 03/27/2022 SLP Individual Time: 1155-2080 SLP Individual Time Calculation (min): 55 min  Short Term Goals: Week 1: SLP Short Term Goal 1 (Week 1): Pt will utilize 2 out of 4 compensatory speech intelligibility strategies at the word and short phrase level with 60% intelligibility given Min A. - Utilized 2 out of 4 speech intelligibility strategies at the word level with 60% intelligibility given Mod A faded to Min A as task progressed. Benefited most for cues to utilize diaphragmatic breathing.  SLP Short Term Goal 2 (Week 1): Pt will participate in trials of regular textures with functional mastication and transit time given Sup A for adherence to aspiration precautions and oral clearance of residuals. - On probe, pt participated in trials of regular textures with primary sucking and munching pattern noted. Improvement in mandibular ROM with soft solid textures noted with use of mirror for biofeedback and Mod-Max verbal and visual cues. Utilized oral clearance strategies with Sup A.   SLP Short Term Goal 3 (Week 1): Pt will demonstrate functional recall of daily information x 3 given Min A. - Did not formally address this date.  SLP Short Term Goal 4 (Week 1): With Sup A cueing from communication partner, pt will self-correct communication breakdown via repetition of verbal message or use of multimodal methods. - With Min A faded to Sup A from communication partner, ,pt will self-correct communication breakdown via repetition of verbal message or use of multimodal methods.   Skilled Therapeutic Interventions: Pt seen this date for skilled ST intervention targeting dysphagia and speech intelligibility goals outlined above. Pt received awake/alert and OOB in w/c. Receiving medication from LPN. Demonstrating difficulty with medication administration whole  with water via straw as evident by coughing and throat clearing. No clinical s/sx concerning for aspiration with whole medication given in puree. Agreeable to ST intervention. Please see above for objective data re: pt's performance during today's session.   Following pt and pt family reports + clinical observation of inconsistent s/sx concerning for pharyngeal dysphagia with thin liquid via cup and straw. Recommend use of nectar-thick liquid in the interim.   Pt left in w/c with daughter present. Call bell within reach and all immediate needs met. Continue per current ST POC.   Pain Reports L shoulder pain; LPN aware  Therapy/Group: Individual Therapy  Tonnette Zwiebel A Hodge Stachnik 03/27/2022, 12:01 PM

## 2022-03-27 NOTE — Progress Notes (Signed)
Modified Barium Swallow Progress Note  Patient Details  Name: Jessica [name] MRN: [id number] Date of Birth: December 07, 1940  Today's Date: 03/27/2022  Modified Barium Swallow completed.  Full report located under Chart Review in the Imaging Section.  Brief recommendations include the following:  Clinical Impression MBSS completed. Pt presents with mild oropharyngeal dysphagia c/b decreased bolus cohesion and prolonged oral transit, which results in premature spillage that falls into the pyriform sinuses before swallow initiation is observed. This results in flash, shallow penetration with large sips of thin liquid via cup; to the level of the TVF's with large straw sips. Nevertheless, no stagnant penetration nor aspiration observed across consistencies. Please see below for diet recommendations and imaging report for oropharyngeal impairments.   Swallow Evaluation Recommendations   SLP Diet Recommendations: Dysphagia 3 (Mech soft) solids;Thin liquid - NO STRAWS   Liquid Administration via: Cup (Provale or open top cup)   Compensations: Slow rate;Small sips/bites;Lingual sweep for clearance of pocketing;Minimize environmental distractions;Follow solids with liquid   Postural Changes: Seated upright at 90 degrees;Remain semi-upright after after feeds/meals (Comment)   Oral Care Recommendations: Oral care BID   Jessica [name] 03/27/2022,3:03 PM

## 2022-03-28 MED ORDER — ACETAMINOPHEN 325 MG PO TABS
325.0000 mg | ORAL_TABLET | Freq: Four times a day (QID) | ORAL | Status: DC
Start: 2022-03-28 — End: 2022-03-31
  Administered 2022-03-28 – 2022-03-31 (×8): 325 mg via ORAL
  Filled 2022-03-28 (×9): qty 1

## 2022-03-28 MED ORDER — SORBITOL 70 % SOLN
30.0000 mL | Freq: Once | Status: AC
  Administered 2022-03-28: 30 mL via ORAL
  Filled 2022-03-28: qty 30

## 2022-03-28 NOTE — Progress Notes (Signed)
PROGRESS NOTE   Subjective/Complaints:  Pt reports shoulder really hurting- voltaren gel doesn't help- from therapy and rolling in bed and w/c.  Tylenol helps but forgets to ask for it.  LBM > 7 days ago- constipated easily.     ROS:  Pt denies SOB, abd pain, CP, N/V/(+)C/D, and vision changes  Objective:   DG Swallowing Func-Speech Pathology  Result Date: 03/27/2022 Table formatting from the original result was not included. Objective Swallowing Evaluation: Type of Study: Bedside Swallow Evaluation  Patient Details Name: Jessica [name] MRN: [id number] Date of Birth: 09/04/1941 Today's Date: 03/27/2022 Time: SLP Start Time (ACUTE ONLY): 1218 -SLP Stop Time (ACUTE ONLY): 3154 SLP Time Calculation (min) (ACUTE ONLY): 17 min Past Medical History: Past Medical History: Diagnosis Date  Allergy   Anemia   Anxiety   Asthma   in past, no inhalers now  Blood transfusion without reported diagnosis   Breast cancer ([place]) 01/2017  left breast   Breast cancer of upper-inner quadrant of left female breast ([place]) 03/11/2017  Cancer ([place]) 01/2017  left breast  Cataract   bilateral removed  Chronic kidney disease   kidney stones  Depression 08/31/2013  Dyslipidemia, goal LDL below 160 08/31/2013  Essential hypertension - well controlled 08/31/2013  Family history of breast cancer   Family history of melanoma   Family history of prostate cancer   GERD (gastroesophageal reflux disease)   Goals of care, counseling/discussion 03/11/2017  History of radiation therapy 10/13/17-11/11/17  left breast 40.05 Gy in 15 fractions, left breast boost 10 Gy in 5 fractions  Hypothyroidism   Obesity (BMI 30-39.9) 08/31/2013  Personal history of chemotherapy 2018  Personal history of radiation therapy 2019  Thyroid disease   Vasovagal near-syncope -rare 08/31/2013 Past Surgical History: Past Surgical History: Procedure Laterality Date  ABDOMINAL HYSTERECTOMY    total  BREAST BIOPSY  Left 02/24/2017   malignant  BREAST LUMPECTOMY Left 03/23/2017  broken fingers    CATARACT EXTRACTION Bilateral   COLONOSCOPY    EXCISIONAL HEMORRHOIDECTOMY    IR FLUORO GUIDE PORT INSERTION RIGHT  08/04/2017  IR REMOVAL TUN ACCESS W/ PORT W/O FL MOD SED  08/04/2017  IR US GUIDE VASC ACCESS RIGHT  08/04/2017  IRRIGATION AND DEBRIDEMENT ABSCESS Left 05/18/2017  Procedure: IRRIGATION AND DEBRIDEMENT LEFT AXILLARY ABSCESS;  Surgeon: Michael Boston, MD;  Location: [facility];  Service: General;  Laterality: Left;  kidney stones lithotripsy    PORTACATH PLACEMENT Right 03/23/2017  Procedure: INSERTION PORT-A-CATH;  Surgeon: Erroll Luna, MD;  Location: [facility];  Service: General;  Laterality: Right;  RADIOACTIVE SEED GUIDED PARTIAL MASTECTOMY WITH AXILLARY SENTINEL LYMPH NODE BIOPSY Left 03/23/2017  Procedure: LEFT BREAST RADIOACTIVE SEED GUIDED PARTIAL MASTECTOMY AND  SENTINEL LYMPH NODE [place];  Surgeon: Erroll Luna, MD;  Location: [facility];  Service: General;  Laterality: Left;  ROTATOR CUFF REPAIR    left   TONSILLECTOMY    WISDOM TOOTH EXTRACTION   HPI: Jessica [name] is a 81 y.o. female who  presented to the emergency room on 03/20/2022 for evaluation of confusion and weakness. MRI revealed a left caudate/external capsule stroke. 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  No data recorded  Recommendations for follow up therapy are one component of a multi-disciplinary discharge planning process, led by the attending physician.  Recommendations may be updated based on patient status, additional functional criteria and insurance authorization. Assessment / Plan / Recommendation   03/27/2022   3:02 PM Clinical Impressions SLP Visit Diagnosis MBSS completed. Pt presents with mild oropharyngeal dysphagia c/b decreased bolus cohesion and prolonged oral transit, which results in  premature spillage that falls into the pyriform sinuses before swallow initiation is observed. This results in flash, shallow penetration with large sips of thin liquid via cup; to the level of the TVF's with large straw sips. Nevertheless, no stagnant penetration nor aspiration observed across consistencies. Please see below for diet recommendations and details re: oropharyngeal impairments appreciated. Dysphagia, oropharyngeal phase (R13.12) Impact on safety and function Mild aspiration risk     03/22/2022  10:35 AM Treatment Recommendations Treatment Recommendations Therapy as outlined in treatment plan below     03/27/2022   3:02 PM Prognosis Prognosis for Safe Diet Advancement Good   03/27/2022   3:02 PM Diet Recommendations SLP Diet Recommendations Dysphagia 3 (Mech soft) solids;Thin liquid Liquid Administration via Cup Compensations Slow rate;Small sips/bites;Lingual sweep for clearance of pocketing;Minimize environmental distractions;Follow solids with liquid Postural Changes Seated upright at 90 degrees;Remain semi-upright after after feeds/meals (Comment)     03/27/2022   3:02 PM Other Recommendations Oral Care Recommendations Oral care BID   03/27/2022   3:02 PM Frequency and Duration  Treatment Duration 2 weeks     03/27/2022   2:59 PM Oral Phase Oral Phase Impaired Oral - Thin Teaspoon NT Oral - Thin Cup Decreased bolus cohesion;Premature spillage Oral - Thin Straw Decreased bolus cohesion;Premature spillage Oral - Puree Delayed oral transit Oral - Mech Soft Impaired mastication;Delayed oral transit;Decreased bolus cohesion Oral - Regular NT Oral - Multi-Consistency Decreased bolus cohesion;Premature spillage Oral - Pill NT    03/27/2022   3:00 PM Pharyngeal Phase Pharyngeal Phase Impaired Pharyngeal- Thin Teaspoon NT Pharyngeal- Thin Cup Delayed swallow initiation-pyriform sinuses;Reduced airway/laryngeal closure Pharyngeal Material enters airway, remains ABOVE vocal cords then ejected out Pharyngeal- Thin  Straw Delayed swallow initiation-pyriform sinuses;Reduced airway/laryngeal closure Pharyngeal Material enters airway, CONTACTS cords and then ejected out Pharyngeal- Puree WFL Pharyngeal Material does not enter airway Pharyngeal- Mechanical Soft WFL Pharyngeal Material does not enter airway Pharyngeal- Regular NT Pharyngeal- Multi-consistency Delayed swallow initiation-pyriform sinuses Pharyngeal Material does not enter airway Pharyngeal- Pill NT    03/27/2022   3:02 PM Cervical Esophageal Phase  Cervical Esophageal Phase [facility] A Lutes 03/27/2022, 3:04 PM                     No results for input(s): WBC, HGB, HCT, PLT in the last 72 hours.  Recent Labs    03/26/22 0955  NA 140  K 3.5  CL 102  CO2 30  GLUCOSE 112*  BUN 11  CREATININE 0.82  CALCIUM 9.4    Intake/Output Summary (Last 24 hours) at 03/28/2022 1309 Last data filed at 03/27/2022 1800 Gross per 24 hour  Intake 280 ml  Output --  Net 280 ml        Physical Exam: Vital Signs Blood pressure (!) 183/81, pulse 77, temperature 97.7 F (36.5 C), temperature source Oral, resp. rate 16, height '5\' 3"'$  (1.6 m), weight 81.5 kg, SpO2 94 %.    General: awake,  alert, appropriate, daughter at bedside; sitting in corner in bedside chair; NAD HENT: ; oropharynx moist CV: regular rate; no JVD Pulmonary: CTA B/L; no W/R/R- good air movement GI: soft, NT, somewhat distended; hypoactive BS Psychiatric: appropriate Neurological: aphasic- but speaks at word level Skin:    General: Skin is warm and dry.     Comments: Left mastectomy scar  Neurological:     Comments: Patient is alert.  Makes eye contact with examiner.  Speech is mildly dysarthric but intelligible. Expressive aphasia.  Provides name and age.  Follows simple commands. Provides biographical information/address. Right central 7. RUE 4/5 with decreased [place]. LUE 4+ to 5/5. RLE 2+/5 prox to 4/5 distally, LLE 3/5 prox to 4+/5 distally. Senses pain and light touch in all 4's.  DTR's 1+. No abnl resting tone.   Psychiatric:     Comments: Flat, cooperative    Assessment/Plan: 1. Functional deficits which require 3+ hours per day of interdisciplinary therapy in a comprehensive inpatient rehab setting. Physiatrist is providing close team supervision and 24 hour management of active medical problems listed below. Physiatrist and rehab team continue to assess barriers to discharge/monitor patient progress toward functional and medical goals  Care Tool:  Bathing    Body parts bathed by patient: Right arm, Left arm, Chest, Abdomen, Right upper leg, Left upper leg, Face   Body parts bathed by helper: Front perineal area, Buttocks, Right lower leg, Left lower leg     Bathing assist Assist Level: Moderate Assistance - Patient 50 - 74%     Upper Body Dressing/Undressing Upper body dressing   What is the patient wearing?: Bra, Pull over shirt    Upper body assist Assist Level: Maximal Assistance - Patient 25 - 49%    Lower Body Dressing/Undressing Lower body dressing      What is the patient wearing?: Underwear/pull up, Pants     Lower body assist Assist for lower body dressing: Moderate Assistance - Patient 50 - 74%     Toileting Toileting    Toileting assist Assist for toileting: Moderate Assistance - Patient 50 - 74%     Transfers Chair/bed transfer  Transfers assist     Chair/bed transfer assist level: Minimal Assistance - Patient > 75%     Locomotion Ambulation   Ambulation assist      Assist level: Minimal Assistance - Patient > 75% Assistive device: Walker-rolling Max distance: 69f   Walk 10 feet activity   Assist     Assist level: Minimal Assistance - Patient > 75% Assistive device: Walker-rolling   Walk 50 feet activity   Assist Walk 50 feet with 2 turns activity did not occur: Safety/medical concerns (fatigue)         Walk 150 feet activity   Assist Walk 150 feet activity did not occur: Safety/medical  concerns         Walk 10 feet on uneven surface  activity   Assist Walk 10 feet on uneven surfaces activity did not occur: Safety/medical concerns         Wheelchair     Assist Is the patient using a wheelchair?: Yes Type of Wheelchair: Manual    Wheelchair assist level: Dependent - Patient 0%      Wheelchair 50 feet with 2 turns activity    Assist        Assist Level: Dependent - Patient 0%   Wheelchair 150 feet activity     Assist      Assist Level: Dependent - Patient  0%   Blood pressure (!) 183/81, pulse 77, temperature 97.7 F (36.5 C), temperature source Oral, resp. rate 16, height '5\' 3"'$  (1.6 m), weight 81.5 kg, SpO2 94 %.  Medical Problem List and Plan: 1. Functional deficits secondary to acute ischemic infarct left caudate body.             -patient may shower             -ELOS/Goals: 7-10 days, mod I goals with PT, OT, SLP  Con't CIR- PT, OT and SLP 2.  Antithrombotics: -DVT/anticoagulation:  Pharmaceutical: Lovenox             -antiplatelet therapy: Aspirin 81 mg daily and Plavix 75 mg day x3 months then aspirin alone as noted by neurology services Dr.Palikh 3. Shoulder pain: PRN voltaren gel added Tylenol as needed  5/27- will make tylenol 325 mg q6 hours scheduled for pain- forgets to ask and then hurts- voltaren gel doesn't help.  4. Mood: Wellbutrin 150 mg daily, Effexor 225 mg daily, Remeron 7.5 mg nightly             -antipsychotic agents: N/A             -team to provide ego support as needed 5. Neuropsych: This patient is capable of making decisions on her own behalf. 6. Skin/Wound Care: Routine skin checks 7. Fluids/Electrolytes/Nutrition: Routine in and outs with follow-up chemistries 8.  Hypertension.  Hydrochlorothiazide 12.5 mg daily added tday.  Monitor with increased mobility. Increase amlodipine to '10mg'$  Discontinue TEDs. 5/27- just increased Norvasc- first dose this AM- will monitor trend next few days- if stays up >045  systolic, will give hydralazine prn.   9.  Hypothyroidism.  Synthroid 10.  Hyperlipidemia.  Lipitor. Pt with history of statin intolerance? 11.  Constipation.  MiraLAX daily, Senokot-S daily. Magnesium gluconate '250mg'$  added HS  5/27- will give Sorbitol 30 cc after therapy today 12.  GERD.  Protonix 13.  History of left breast cancer.  Status postmastectomy/radiation therapy 2018 14. Hyperglycemia: educated regarding choosing foods with low added sugar.  15. Hypokalemia: increase klor to 105mq daily.  16. Daytime somnolence: Modafinil '100mg'$  daily started. Wait for improved blood pressure control before increasing 17. OSA: refer for outpatient [facility] eval    I spent a total of 36   minutes on total care today- >50% coordination of care- due to d/w nursing about BP as well as prolonged d/w pwith pt/daughter about pain/constipation LOS: 5 days A FACE TO FACE EVALUATION WAS PERFORMED  Pratt Bress 03/28/2022, 1:09 PM

## 2022-03-28 NOTE — Progress Notes (Signed)
Speech Language Pathology Daily Session Note  Patient Details  Name: Jessica [name] MRN: [id number] Date of Birth: 05-12-1941  Today's Date: 03/28/2022 SLP Individual Time: 9798-9211 SLP Individual Time Calculation (min): 45 min  Short Term Goals: Week 1: SLP Short Term Goal 1 (Week 1): Pt will utilize 2 out of 4 compensatory speech intelligibility strategies at the word and short phrase level with 60% intelligibility given Min A. SLP Short Term Goal 2 (Week 1): Pt will participate in trials of regular textures with functional mastication and transit time given Sup A for adherence to aspiration precautions and oral clearance of residuals. SLP Short Term Goal 3 (Week 1): Pt will demonstrate functional recall of daily information x 3 given Min A. SLP Short Term Goal 4 (Week 1): With Sup A cueing from communication partner, pt will self-correct communication breakdown via repetition of verbal message or use of multimodal methods.  Skilled Therapeutic Interventions: Skilled ST treatment focused on speech goals. SLP facilitated session by providing mod fading to min-to-mod A for implementation of speech strategies (i.e., slow rate, pause between words) at the word and short sentence level to achieve 75% intelligibility. Pt utilized self rating scale on performance which increased her ability to monitor and repair speech errors with min-to-mod A verbal cues. Patient was left in recliner with alarm activated and immediate needs within reach at end of session. Continue per current plan of care.      Pain Pain Assessment Pain Scale: 0-10 Pain Score: 0-No pain  Therapy/Group: Individual Therapy  Patty Sermons 03/28/2022, 2:11 PM

## 2022-03-29 MED ORDER — CHLORHEXIDINE GLUCONATE CLOTH 2 % EX PADS
6.0000 | MEDICATED_PAD | Freq: Every day | CUTANEOUS | Status: DC
  Administered 2022-03-29 – 2022-03-31 (×3): 6 via TOPICAL

## 2022-03-29 MED ORDER — HEPARIN SOD (PORK) LOCK FLUSH 100 UNIT/ML IV SOLN
500.0000 [IU] | INTRAVENOUS | Status: DC | PRN
  Administered 2022-03-29: 500 [IU]
  Filled 2022-03-29: qty 5

## 2022-03-29 MED ORDER — HEPARIN SOD (PORK) LOCK FLUSH 100 UNIT/ML IV SOLN
500.0000 [IU] | INTRAVENOUS | Status: DC
  Administered 2022-03-29: 500 [IU]
  Filled 2022-03-29: qty 5

## 2022-03-29 MED ORDER — SODIUM CHLORIDE 0.9% FLUSH
10.0000 mL | Freq: Two times a day (BID) | INTRAVENOUS | Status: DC
  Administered 2022-03-29 – 2022-03-31 (×2): 10 mL

## 2022-03-29 MED ORDER — SODIUM CHLORIDE 0.9% FLUSH
10.0000 mL | INTRAVENOUS | Status: DC | PRN
  Administered 2022-03-29: 10 mL

## 2022-03-29 NOTE — Progress Notes (Signed)
PROGRESS NOTE   Subjective/Complaints:  Shoulder pain better with tylenol.  With scheduled tylenol.  Helped last night per pt.   Daughter asking about d/c plan and f/u with PCP due to elevated BP- explained just got increase in BP meds starting yesterday, so heading in right direction, but should take ~ 1 week until hit maximal improvement.      ROS:   Pt denies SOB, abd pain, CP, N/V/C/D, and vision changes  Objective:   DG Swallowing Func-Speech Pathology  Result Date: 03/27/2022 Table formatting from the original result was not included. Objective Swallowing Evaluation: Type of Study: Bedside Swallow Evaluation  Patient Details Name: Jessica [name] MRN: [id number] Date of Birth: 1941/09/03 Today's Date: 03/27/2022 Time: SLP Start Time (ACUTE ONLY): 1218 -SLP Stop Time (ACUTE ONLY): 2637 SLP Time Calculation (min) (ACUTE ONLY): 17 min Past Medical History: Past Medical History: Diagnosis Date  Allergy   Anemia   Anxiety   Asthma   in past, no inhalers now  Blood transfusion without reported diagnosis   Breast cancer ([place]) 01/2017  left breast   Breast cancer of upper-inner quadrant of left female breast ([place]) 03/11/2017  Cancer ([place]) 01/2017  left breast  Cataract   bilateral removed  Chronic kidney disease   kidney stones  Depression 08/31/2013  Dyslipidemia, goal LDL below 160 08/31/2013  Essential hypertension - well controlled 08/31/2013  Family history of breast cancer   Family history of melanoma   Family history of prostate cancer   GERD (gastroesophageal reflux disease)   Goals of care, counseling/discussion 03/11/2017  History of radiation therapy 10/13/17-11/11/17  left breast 40.05 Gy in 15 fractions, left breast boost 10 Gy in 5 fractions  Hypothyroidism   Obesity (BMI 30-39.9) 08/31/2013  Personal history of chemotherapy 2018  Personal history of radiation therapy 2019  Thyroid disease   Vasovagal near-syncope -rare  08/31/2013 Past Surgical History: Past Surgical History: Procedure Laterality Date  ABDOMINAL HYSTERECTOMY    total  BREAST BIOPSY Left 02/24/2017   malignant  BREAST LUMPECTOMY Left 03/23/2017  broken fingers    CATARACT EXTRACTION Bilateral   COLONOSCOPY    EXCISIONAL HEMORRHOIDECTOMY    IR FLUORO GUIDE PORT INSERTION RIGHT  08/04/2017  IR REMOVAL TUN ACCESS W/ PORT W/O FL MOD SED  08/04/2017  IR US GUIDE VASC ACCESS RIGHT  08/04/2017  IRRIGATION AND DEBRIDEMENT ABSCESS Left 05/18/2017  Procedure: IRRIGATION AND DEBRIDEMENT LEFT AXILLARY ABSCESS;  Surgeon: Michael Boston, MD;  Location: [facility];  Service: General;  Laterality: Left;  kidney stones lithotripsy    PORTACATH PLACEMENT Right 03/23/2017  Procedure: INSERTION PORT-A-CATH;  Surgeon: Erroll Luna, MD;  Location: [place];  Service: General;  Laterality: Right;  RADIOACTIVE SEED GUIDED PARTIAL MASTECTOMY WITH AXILLARY SENTINEL LYMPH NODE BIOPSY Left 03/23/2017  Procedure: LEFT BREAST RADIOACTIVE SEED GUIDED PARTIAL MASTECTOMY AND  SENTINEL LYMPH NODE [place];  Surgeon: Erroll Luna, MD;  Location: [place];  Service: General;  Laterality: Left;  ROTATOR CUFF REPAIR    left   TONSILLECTOMY    WISDOM TOOTH EXTRACTION   HPI: Jessica [name] is a 81 y.o. female who  presented to the emergency room on  03/20/2022 for evaluation of confusion and weakness. MRI revealed a left caudate/external capsule stroke. 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  No data recorded  Recommendations for follow up therapy are one component of a multi-disciplinary discharge planning process, led by the attending physician.  Recommendations may be updated based on patient status, additional functional criteria and insurance authorization. Assessment / Plan / Recommendation   03/27/2022   3:02 PM Clinical Impressions SLP Visit Diagnosis MBSS  completed. Pt presents with mild oropharyngeal dysphagia c/b decreased bolus cohesion and prolonged oral transit, which results in premature spillage that falls into the pyriform sinuses before swallow initiation is observed. This results in flash, shallow penetration with large sips of thin liquid via cup; to the level of the TVF's with large straw sips. Nevertheless, no stagnant penetration nor aspiration observed across consistencies. Please see below for diet recommendations and details re: oropharyngeal impairments appreciated. Dysphagia, oropharyngeal phase (R13.12) Impact on safety and function Mild aspiration risk     03/22/2022  10:35 AM Treatment Recommendations Treatment Recommendations Therapy as outlined in treatment plan below     03/27/2022   3:02 PM Prognosis Prognosis for Safe Diet Advancement Good   03/27/2022   3:02 PM Diet Recommendations SLP Diet Recommendations Dysphagia 3 (Mech soft) solids;Thin liquid Liquid Administration via Cup Compensations Slow rate;Small sips/bites;Lingual sweep for clearance of pocketing;Minimize environmental distractions;Follow solids with liquid Postural Changes Seated upright at 90 degrees;Remain semi-upright after after feeds/meals (Comment)     03/27/2022   3:02 PM Other Recommendations Oral Care Recommendations Oral care BID   03/27/2022   3:02 PM Frequency and Duration  Treatment Duration 2 weeks     03/27/2022   2:59 PM Oral Phase Oral Phase Impaired Oral - Thin Teaspoon NT Oral - Thin Cup Decreased bolus cohesion;Premature spillage Oral - Thin Straw Decreased bolus cohesion;Premature spillage Oral - Puree Delayed oral transit Oral - Mech Soft Impaired mastication;Delayed oral transit;Decreased bolus cohesion Oral - Regular NT Oral - Multi-Consistency Decreased bolus cohesion;Premature spillage Oral - Pill NT    03/27/2022   3:00 PM Pharyngeal Phase Pharyngeal Phase Impaired Pharyngeal- Thin Teaspoon NT Pharyngeal- Thin Cup Delayed swallow initiation-pyriform  sinuses;Reduced airway/laryngeal closure Pharyngeal Material enters airway, remains ABOVE vocal cords then ejected out Pharyngeal- Thin Straw Delayed swallow initiation-pyriform sinuses;Reduced airway/laryngeal closure Pharyngeal Material enters airway, CONTACTS cords and then ejected out Pharyngeal- Puree WFL Pharyngeal Material does not enter airway Pharyngeal- Mechanical Soft WFL Pharyngeal Material does not enter airway Pharyngeal- Regular NT Pharyngeal- Multi-consistency Delayed swallow initiation-pyriform sinuses Pharyngeal Material does not enter airway Pharyngeal- Pill NT    03/27/2022   3:02 PM Cervical Esophageal Phase  Cervical Esophageal Phase [facility] 03/27/2022, 3:04 PM                     No results for input(s): WBC, HGB, HCT, PLT in the last 72 hours.  No results for input(s): NA, K, CL, CO2, GLUCOSE, BUN, CREATININE, CALCIUM in the last 72 hours.   Intake/Output Summary (Last 24 hours) at 03/29/2022 1035 Last data filed at 03/28/2022 1344 Gross per 24 hour  Intake 237 ml  Output --  Net 237 ml        Physical Exam: Vital Signs Blood pressure (!) 166/61, pulse 65, temperature 98.2 F (36.8 C), resp. rate 16, height '5\' 3"'$  (1.6 m), weight 81.5 kg, SpO2 95 %.  General: awake, alert, appropriate, sitting at sink doing grooming; daughter at bedside- different daughter; NAD HENT: oropharynx moist CV: regular rate; no JVD Pulmonary: CTA B/L; no W/R/R- good air movement GI: soft, NT, ND, (+)BS Psychiatric: appropriate- flat Neurological:  aphasic, but speaking at word level Skin:    General: Skin is warm and dry.     Comments: Left mastectomy scar  Neurological:     Comments: Patient is alert.  Makes eye contact with examiner.  Speech is mildly dysarthric but intelligible. Expressive aphasia.  Provides name and age.  Follows simple commands. Provides biographical information/address. Right central 7. RUE 4/5 with decreased [place]. LUE 4+ to 5/5. RLE 2+/5 prox to 4/5  distally, LLE 3/5 prox to 4+/5 distally. Senses pain and light touch in all 4's. DTR's 1+. No abnl resting tone.   Psychiatric:     Comments: Flat, cooperative    Assessment/Plan: 1. Functional deficits which require 3+ hours per day of interdisciplinary therapy in a comprehensive inpatient rehab setting. Physiatrist is providing close team supervision and 24 hour management of active medical problems listed below. Physiatrist and rehab team continue to assess barriers to discharge/monitor patient progress toward functional and medical goals  Care Tool:  Bathing    Body parts bathed by patient: Right arm, Left arm, Chest, Abdomen, Right upper leg, Left upper leg, Face   Body parts bathed by helper: Front perineal area, Buttocks, Right lower leg, Left lower leg     Bathing assist Assist Level: Moderate Assistance - Patient 50 - 74%     Upper Body Dressing/Undressing Upper body dressing   What is the patient wearing?: Bra, Pull over shirt    Upper body assist Assist Level: Maximal Assistance - Patient 25 - 49%    Lower Body Dressing/Undressing Lower body dressing      What is the patient wearing?: Underwear/pull up, Pants     Lower body assist Assist for lower body dressing: Moderate Assistance - Patient 50 - 74%     Toileting Toileting    Toileting assist Assist for toileting: Moderate Assistance - Patient 50 - 74%     Transfers Chair/bed transfer  Transfers assist     Chair/bed transfer assist level: Minimal Assistance - Patient > 75%     Locomotion Ambulation   Ambulation assist      Assist level: Minimal Assistance - Patient > 75% Assistive device: Walker-rolling Max distance: 38f   Walk 10 feet activity   Assist     Assist level: Minimal Assistance - Patient > 75% Assistive device: Walker-rolling   Walk 50 feet activity   Assist Walk 50 feet with 2 turns activity did not occur: Safety/medical concerns (fatigue)         Walk 150  feet activity   Assist Walk 150 feet activity did not occur: Safety/medical concerns         Walk 10 feet on uneven surface  activity   Assist Walk 10 feet on uneven surfaces activity did not occur: Safety/medical concerns         Wheelchair     Assist Is the patient using a wheelchair?: Yes Type of Wheelchair: Manual    Wheelchair assist level: Dependent - Patient 0%      Wheelchair 50 feet with 2 turns activity    Assist        Assist Level: Dependent - Patient 0%   Wheelchair 150 feet activity     Assist      Assist Level: Dependent -  Patient 0%   Blood pressure (!) 166/61, pulse 65, temperature 98.2 F (36.8 C), resp. rate 16, height '5\' 3"'$  (1.6 m), weight 81.5 kg, SpO2 95 %.  Medical Problem List and Plan: 1. Functional deficits secondary to acute ischemic infarct left caudate body.             -patient may shower             -ELOS/Goals: 7-10 days, mod I goals with PT, OT, SLP  Con't CIR_ PT, OT and SLP 2.  Antithrombotics: -DVT/anticoagulation:  Pharmaceutical: Lovenox             -antiplatelet therapy: Aspirin 81 mg daily and Plavix 75 mg day x3 months then aspirin alone as noted by neurology services Dr.Palikh 3. Shoulder pain: PRN voltaren gel added Tylenol as needed  5/27- will make tylenol 325 mg q6 hours scheduled for pain- forgets to ask and then hurts- voltaren gel doesn't help. 5/28- pain much better with scheduled tylenol per pt.   4. Mood: Wellbutrin 150 mg daily, Effexor 225 mg daily, Remeron 7.5 mg nightly             -antipsychotic agents: N/A             -team to provide ego support as needed 5. Neuropsych: This patient is capable of making decisions on her own behalf. 6. Skin/Wound Care: Routine skin checks 7. Fluids/Electrolytes/Nutrition: Routine in and outs with follow-up chemistries 8.  Hypertension.  Hydrochlorothiazide 12.5 mg daily added tday.  Monitor with increased mobility. Increase amlodipine to '10mg'$   Discontinue TEDs. 5/27- just increased Norvasc- first dose this AM- will monitor trend next few days- if stays up >944 systolic, will give hydralazine prn.    5/28- BP 150s-160s in first 24 hours after increase in Norvasc- explained to pt/daughter will take ~ 1 week to hit maximal effect.  9.  Hypothyroidism.  Synthroid 10.  Hyperlipidemia.  Lipitor. Pt with history of statin intolerance? 11.  Constipation.  MiraLAX daily, Senokot-S daily. Magnesium gluconate '250mg'$  added HS  5/27- will give Sorbitol 30 cc after therapy today  5/28- large BM after therapy yesterday-  12.  GERD.  Protonix 13.  History of left breast cancer.  Status postmastectomy/radiation therapy 2018 14. Hyperglycemia: educated regarding choosing foods with low added sugar.  15. Hypokalemia: increase klor to 49mq daily.  16. Daytime somnolence: Modafinil '100mg'$  daily started. Wait for improved blood pressure control before increasing 17. OSA: refer for outpatient [facility] eval    I spent a total of 39   minutes on total care today- >50% coordination of care- due to d/w pt and daughter about BP and explaining change in norvasc, need for PCP f/u within 2 weeks. And to get BP cuff at home.    LOS: 6 days A FACE TO FACE EVALUATION WAS PERFORMED  Kessa Fairbairn 03/29/2022, 10:35 AM

## 2022-03-29 NOTE — Plan of Care (Signed)
  Problem: RH Balance Goal: LTG Patient will maintain dynamic standing balance (PT) Description: LTG:  Patient will maintain dynamic standing balance with assistance during mobility activities (PT) Flowsheets (Taken 03/29/2022 0739) LTG: Pt will maintain dynamic standing balance during mobility activities with:: Contact Guard/Touching assist   Problem: Sit to Stand Goal: LTG:  Patient will perform sit to stand with assistance level (PT) Description: LTG:  Patient will perform sit to stand with assistance level (PT) Flowsheets (Taken 03/29/2022 0739) LTG: PT will perform sit to stand in preparation for functional mobility with assistance level: Minimal Assistance - Patient > 75%   Problem: RH Bed Mobility Goal: LTG Patient will perform bed mobility with assist (PT) Description: LTG: Patient will perform bed mobility with assistance, with/without cues (PT). Flowsheets (Taken 03/29/2022 0739) LTG: Pt will perform bed mobility with assistance level of: Minimal Assistance - Patient > 75%   Problem: RH Bed to Chair Transfers Goal: LTG Patient will perform bed/chair transfers w/assist (PT) Description: LTG: Patient will perform bed to chair transfers with assistance (PT). Flowsheets (Taken 03/29/2022 0739) LTG: Pt will perform Bed to Chair Transfers with assistance level: Contact Guard/Touching assist   Problem: RH Car Transfers Goal: LTG Patient will perform car transfers with assist (PT) Description: LTG: Patient will perform car transfers with assistance (PT). Flowsheets (Taken 03/29/2022 0739) LTG: Pt will perform car transfers with assist:: Contact Guard/Touching assist   Problem: RH Ambulation Goal: LTG Patient will ambulate in controlled environment (PT) Description: LTG: Patient will ambulate in a controlled environment, # of feet with assistance (PT). Flowsheets (Taken 03/29/2022 0739) LTG: Pt will ambulate in controlled environ  assist needed:: Contact Guard/Touching assist LTG:  Ambulation distance in controlled environment: 125f Goal: LTG Patient will ambulate in home environment (PT) Description: LTG: Patient will ambulate in home environment, # of feet with assistance (PT). Flowsheets (Taken 03/29/2022 0739) LTG: Pt will ambulate in home environ  assist needed:: Contact Guard/Touching assist LTG: Ambulation distance in home environment: 58f  Problem: RH Stairs Goal: LTG Patient will ambulate up and down stairs w/assist (PT) Description: LTG: Patient will ambulate up and down # of stairs with assistance (PT) Flowsheets (Taken 03/29/2022 0739) LTG: Pt will ambulate up/down stairs assist needed:: Minimal Assistance - Patient > 75% LTG: Pt will  ambulate up and down number of stairs: 4

## 2022-03-29 NOTE — Discharge Summary (Signed)
Physical Therapy Discharge Summary  Patient Details  Name: Jessica [name] MRN: [id number] Date of Birth: Nov 07, 1940  Patient has met 8 of 8 long term goals due to improved activity tolerance, improved balance, improved postural control, increased strength, ability to compensate for deficits, improved attention, and improved awareness.  Patient to discharge at an ambulatory level  CGA .   Patient's care partner is independent to provide the necessary physical and cognitive assistance at discharge. Family training has been completed with x3 sisters and her daughter-in-law. The sister's demonstrated understanding of her deficits and needs, as well as guarding and assisting her in functional mobility.   All goals met  Recommendation:  Patient will benefit from ongoing skilled PT services in home health setting to continue to advance safe functional mobility, address ongoing impairments in bed mobility, functional transfers, dynamic standing balance, gait training, home safety, caregiver training, fall prevention, stair training, and minimize fall risk.  Equipment: None. Family owns RW and wheelchair.  Reasons for discharge: treatment goals met and discharge from hospital  Patient/family agrees with progress made and goals achieved: Yes  PT Discharge Precautions/Restrictions Precautions Precautions: Fall Precaution Comments: Aphasia Restrictions Weight Bearing Restrictions: No Pain Interference Pain Interference Pain Effect on Sleep: 3. Frequently Pain Interference with Therapy Activities: 2. Occasionally Pain Interference with Day-to-Day Activities: 3. Frequently  Vision/Perception  Vision - History Ability to See in Adequate Light: 0 Adequate Perception Perception: Impaired Spatial Orientation: Moderate posterior bias Praxis Praxis: Impaired Praxis Impairment Details: Motor planning;Initiation  Cognition Overall Cognitive Status: Impaired/Different from  baseline Arousal/Alertness: Awake/alert Orientation Level: Oriented X4 Attention: Selective;Focused;Sustained Focused Attention: Appears intact Sustained Attention: Appears intact Selective Attention: Impaired Selective Attention Impairment: Functional basic;Verbal basic Memory: Impaired Memory Impairment: Decreased recall of new information Awareness: Impaired Awareness Impairment: Emergent impairment;Anticipatory impairment Problem Solving: Impaired Problem Solving Impairment: Functional basic;Verbal basic Safety/Judgment: Appears intact Sensation Sensation Light Touch: Impaired by gross assessment Hot/Cold: Not tested Proprioception: Impaired by gross assessment Stereognosis: Not tested Coordination Gross Motor Movements are Fluid and Coordinated: No Coordination and Movement Description: Limited by mild R hemi and generalized weakness/deconditioning Heel Shin Test: Limited by hip flexor weakness Motor  Motor Motor: Hemiplegia;Abnormal postural alignment and control Motor - Discharge Observations: Mild R hemi, persistent posterior lean  Mobility Bed Mobility Bed Mobility: Sit to Supine;Supine to Sit Supine to Sit: Minimal Assistance - Patient > 75% Sit to Supine: Minimal Assistance - Patient > 75% Transfers Transfers: Sit to Stand;Stand to Sit Sit to Stand: Contact Guard/Touching assist;Minimal Assistance - Patient > 75% Stand to Sit: Contact Guard/Touching assist;Minimal Assistance - Patient > 75% Stand Pivot Transfers: Contact Guard/Touching assist;Minimal Assistance - Patient > 75% Stand Pivot Transfer Details: Verbal cues for sequencing;Tactile cues for posture;Tactile cues for initiation;Tactile cues for sequencing;Verbal cues for technique;Manual facilitation for weight shifting;Verbal cues for precautions/safety;Tactile cues for weight shifting Transfer (Assistive device): Rolling walker Locomotion  Gait Ambulation: Yes Gait Assistance: Contact Guard/Touching  assist Gait Distance (Feet): 150 Feet Assistive device: Rolling walker Gait Assistance Details: Verbal cues for sequencing;Verbal cues for gait pattern;Verbal cues for safe use of DME/AE;Verbal cues for precautions/safety;Verbal cues for technique;Tactile cues for weight beaing Gait Gait: Yes Gait Pattern: Impaired Gait Pattern: Decreased stride length;Trunk flexed;Poor foot clearance - right;Poor foot clearance - left;Step-to pattern;Decreased step length - right Gait velocity: reduced Stairs / Additional Locomotion Stairs: Yes Stairs Assistance: Minimal Assistance - Patient > 75% Stair Management Technique: Two rails;Step to pattern;Forwards Number of Stairs: 12 Height of Stairs: 6 Wheelchair Mobility Wheelchair Mobility:  No  Trunk/Postural Assessment  Cervical Assessment Cervical Assessment: Exceptions to [facility] (forward head) Thoracic Assessment Thoracic Assessment:  (rounded shoulers with kyphosis) Lumbar Assessment Lumbar Assessment:  (posterior pelvis) Postural Control Postural Control: Deficits on evaluation Righting Reactions: Delayed Postural Limitations: Posterior lean  Balance Balance Balance Assessed: Yes Standardized Balance Assessment Standardized Balance Assessment: Berg Balance Test Berg Balance Test Sit to Stand: Able to stand without using hands and stabilize independently Standing Unsupported: Able to stand 2 minutes with supervision Sitting with Back Unsupported but Feet Supported on Floor or Stool: Able to sit safely and securely 2 minutes Stand to Sit: Sits safely with minimal use of hands Transfers: Able to transfer with verbal cueing and /or supervision Standing Unsupported with Eyes Closed: Able to stand 10 seconds with supervision Standing Ubsupported with Feet Together: Able to place feet together independently and stand for 1 minute with supervision From Standing, Reach Forward with Outstretched Arm: Reaches forward but needs supervision From  Standing Position, Pick up Object from Floor: Able to pick up shoe, needs supervision From Standing Position, Turn to Look Behind Over each Shoulder: Looks behind one side only/other side shows less weight shift Turn 360 Degrees: Needs close supervision or verbal cueing Standing Unsupported, Alternately Place Feet on Step/Stool: Able to complete >2 steps/needs minimal assist Standing Unsupported, One Foot in Front: Able to take small step independently and hold 30 seconds Standing on One Leg: Unable to try or needs assist to prevent fall Total Score: 34 Timed Up and Go Test TUG: Normal TUG Normal TUG (seconds): 47 (with RW) Static Sitting Balance Static Sitting - Balance Support: No upper extremity supported;Feet supported Static Sitting - Level of Assistance: 5: Stand by assistance Dynamic Sitting Balance Dynamic Sitting - Balance Support: No upper extremity supported;Feet supported;During functional activity Dynamic Sitting - Level of Assistance: 5: Stand by assistance (CGA) Static Standing Balance Static Standing - Balance Support: Bilateral upper extremity supported Static Standing - Level of Assistance: 5: Stand by assistance Dynamic Standing Balance Dynamic Standing - Balance Support: Bilateral upper extremity supported Dynamic Standing - Level of Assistance: 4: Min assist Extremity Assessment  RLE Assessment RLE Assessment: Exceptions to [facility] RLE Strength RLE Overall Strength: Deficits Right Hip Flexion: 3+/5 Right Hip ABduction: 4-/5 Right Hip ADduction: 4-/5 Right Knee Flexion: 3+/5 Right Knee Extension: 4/5 Right Ankle Dorsiflexion: 4/5 LLE Assessment LLE Assessment: Exceptions to [facility] LLE Strength LLE Overall Strength: Deficits Left Hip Flexion: 4-/5 Left Hip ABduction: 4/5 Left Hip ADduction: 4/5 Left Knee Flexion: 4-/5 Left Knee Extension: 4/5 Left Ankle Dorsiflexion: 4/5  Christian P Manhard PT Becky Sax PT, DPT  03/29/2022, 7:45 AM

## 2022-03-29 NOTE — Progress Notes (Signed)
Occupational Therapy Session Note  Patient Details  Name: LEMA HEINKEL MRN: [id number] Date of Birth: 1941/01/22  Today's Date: 03/29/2022 OT Individual Time: 4098-1191 OT Individual Time Calculation (min): 55 min    Short Term Goals: Week 1:  OT Short Term Goal 1 (Week 1): Patient will stand with no more than single UE support and intermittent min assistance for LB dressing and/or toileting OT Short Term Goal 2 (Week 1): Patient will demonstrate sufficient range of motion and activity tolerance to use BUE to shampoo her hair in the shower OT Short Term Goal 3 (Week 1): Patient will dress herself (excluding bra) with min assistance OT Short Term Goal 4 (Week 1): Patient will complete toilet hygiene with min assistance  Skilled Therapeutic Interventions/Progress Updates:  Skilled OT intervention completed with focus on family education, with pt's sisters present regarding toilet and tub transfers, bed mobility. Pt received seated in w/c, minimally verbal, family reporting her fatigue, however no distress or pain detected or reported. [name] family reported they have been assisting pt to and from the bathroom in room initially a little hesitant to OT education, however therapist encouraged all members who plan to assist pt at d/c to demonstrate ability in tighter space to better simulate the environment at d/c. Transported in w/c dependently for time management <> room. [name] family reports pt will be using TTB, with therapist providing education provided on how to position and set up leg length for bench once home, suggested use of [facility] for ease of bathing, shower curtain management to prevent water spillage, minimizing stands via lateral leans for pericare, use of grab bars/installation as well as effective safety strategies for exiting the shower to eliminate falls. With use of hands on assist from family, pt transferred via ambulatory transfer to TTB with CGA using RW, then sit pivot with min A  in/out of tub for BLE management. Safety cues needed for hip positioning when pivoting out of tub, and prior to standing. Family assisted pt at [facility] level with stand pivot to [facility] like she would need to do at home, with pt demonstrating minor posterior/R lean in stance at times. [name] sister who's home she will d/c to did not initially provide proper positional/safety cueing for pt with therapist stepping in to guide pt and better promote safety during transfer. Education about body positioning/mechanics, cueing pt for maximum safety as well as more assist needed with fatigue. Back in room, family assisted pt to bathroom, completed toileting with no physical from therapist, completed hand washing in stance. Education provided per request about proper bed mobility with therapist demonstrating with pt at min A level for BLE management. Pt was left semi-supine, with nursing in room for port management/education with all needs met at direct care handoff.    Therapy Documentation Precautions:  Precautions Precautions: Fall Precaution Comments: Aphasia Restrictions Weight Bearing Restrictions: No    Therapy/Group: Individual Therapy  Noeh Sparacino E [place] Warriner 03/29/2022, 7:44 AM

## 2022-03-29 NOTE — Progress Notes (Signed)
The needle change supposed to 5/26 however, there wasn't on the IV team line care list. Patient's RN consult for change needle of PAC. There is no IV meds or not frequent blood lab work, this case preventing infection sources rather to be de-accessed port. Asked patient regarding this matter and gave choice for de-accessing or re-accessing of PAC. She decided to de-accessed PAC. Informed patient's RN this and de-accessed with heparinized at this time. HS [employer]

## 2022-03-29 NOTE — Progress Notes (Signed)
Physical Therapy Session Note  Patient Details  Name: Jessica [name] MRN: [id number] Date of Birth: 02-08-1941  Today's Date: 03/29/2022 PT Individual Time: 1000-1057 + 1415-1524 PT Individual Time Calculation (min): 57 min  + 69 min  Short Term Goals: Week 1:  PT Short Term Goal 1 (Week 1): STG = LTG due to ELOS  Skilled Therapeutic Interventions/Progress Updates:      1st session: Pt seen sitting in w/c to start with multiple family (x3 sisters, x1 sister-in-law) at the bedside. Pt denies pain and is agreeable to PT tx. RN at bedside also administering morning Rx. Discussed with family pt's current mobility status, PT goals, primary deficits related to CVA, balance impairments with posterior bias, home safety, fall prevention, and role of f/u therapy services. All questions/concerns addressed from family members. Transported pt to ortho rehab gym. Completed car transfer with car height simulating [employer] - pt completes with CGA and RW - able to manage LE in/out without physical assist and understands safety approach to car. Family actively observing and asking appropriate questions. Pt ambulated with CGA and RW to main rehab gym, ~150ft. Family actively observing and we discussed primary gait deficits of decreased R step length and decreased R step height - primarily occurring with fatigue during longer distance gaits trials. Focused remainder of session on curb-step transfers. Used 5inch curb to simulate their home entrance. PT completing x4 times to ensure understanding of technique, guarding, and sequencing. Pt requiring CGA for curb-step transfers. X2 daughters completed this with hands on training as well, demonstrating appropriate guarding and cueing for patient. Pt was returned to her room in w/c and remained seated with all family members at the bedside. Upcoming OT family education session after.   2nd session: Pt supine in bed to start with her daughter at the bedside. Pt agreeable  to PT tx and denies pain but does report fatigue from busy day of therapies. Supine<>sitting EOB requiring minA for initiation and log rolling techniqu - used bed rails but HOB was flat. Able to sit unsupported with supervision with PT donned tennis shoes with totalA for time management. Stand<>pivot transfer with minA and no AD from EOB to w/c, cues for sequencing and safety, as well as hand placement during transfer. Transported to main rehab gym for time management. Developed and printed HEP as listed below. Reviewed this with her verbally as well as physically performing each there-ex 1x10 each. All there-ex completed with supervision assist.   Home Exercise Program: Access Code: [facility] URL: [email or web address] Date: 03/29/2022 Prepared by: Ginnie Smart  Exercises - Sit to Stand with Counter Support  - 1 x daily - 7 x weekly - 3 sets - 10 reps - Standing March with Counter Support  - 1 x daily - 7 x weekly - 3 sets - 10 reps - Standing Hip Abduction with Counter Support  - 1 x daily - 7 x weekly - 3 sets - 10 reps - Heel Raises with Counter Support  - 1 x daily - 7 x weekly - 3 sets - 10 reps - Mini Squat with Counter Support  - 1 x daily - 7 x weekly - 3 sets - 10 reps  Pt completed TUG and BERG for falls risk and balance assessment. See below for details. She improved her TUG score by 53 seconds in 5 days!  Patient demonstrates increased fall risk as noted by score of  34/56 on Berg Balance Scale.  (<36= high risk for falls, close to 100%;  37-45 significant >80%; 46-51 moderate >50%; 52-55 lower >25%)  Pt ambulated back to her room, ~280f, with CGA and RW. Cues for increasing R step length and height, especially after ~738fof gait. Pt requesting to return to bed due to fatigue. Bed mobility completed with supervision with HOB elevated and use of bed rails. Daughter updated at bedside of pt's mobility during session. All needs met.  Therapy  Documentation Precautions:  Precautions Precautions: Fall Restrictions Weight Bearing Restrictions: No General:    Balance Balance Assessed: Yes Standardized Balance Assessment Standardized Balance Assessment: Berg Balance Test Berg Balance Test Sit to Stand: Able to stand without using hands and stabilize independently Standing Unsupported: Able to stand 2 minutes with supervision Sitting with Back Unsupported but Feet Supported on Floor or Stool: Able to sit safely and securely 2 minutes Stand to Sit: Sits safely with minimal use of hands Transfers: Able to transfer with verbal cueing and /or supervision Standing Unsupported with Eyes Closed: Able to stand 10 seconds with supervision Standing Ubsupported with Feet Together: Able to place feet together independently and stand for 1 minute with supervision From Standing, Reach Forward with Outstretched Arm: Reaches forward but needs supervision From Standing Position, Pick up Object from Floor: Able to pick up shoe, needs supervision From Standing Position, Turn to Look Behind Over each Shoulder: Looks behind one side only/other side shows less weight shift Turn 360 Degrees: Needs close supervision or verbal cueing Standing Unsupported, Alternately Place Feet on Step/Stool: Able to complete >2 steps/needs minimal assist Standing Unsupported, One Foot in Front: Able to take small step independently and hold 30 seconds Standing on One Leg: Unable to try or needs assist to prevent fall Total Score: 34/56  Timed Up and Go Test TUG: Normal TUG Normal TUG (seconds): 47 (with RW)  Static Sitting Balance Static Sitting - Balance Support: No upper extremity supported;Feet supported Static Sitting - Level of Assistance: 5: Stand by assistance Dynamic Sitting Balance Dynamic Sitting - Balance Support: No upper extremity supported;Feet supported;During functional activity Dynamic Sitting - Level of Assistance: 5: Stand by assistance  (CGA) Static Standing Balance Static Standing - Balance Support: Bilateral upper extremity supported Static Standing - Level of Assistance: 5: Stand by assistance Dynamic Standing Balance Dynamic Standing - Balance Support: Bilateral upper extremity supported Dynamic Standing - Level of Assistance: 4: Min assist  Therapy/Group: Individual Therapy  Katianne Barre P Random Dobrowski PT 03/29/2022, 7:37 AM

## 2022-03-30 ENCOUNTER — Inpatient Hospital Stay (HOSPITAL_COMMUNITY): Payer: Medicare Other

## 2022-03-30 LAB — CREATININE, SERUM
Creatinine, Ser: 0.74 mg/dL (ref 0.44–1.00)
GFR, Estimated: >60 mL/min (ref >60)

## 2022-03-30 MED ORDER — HYDROCHLOROTHIAZIDE 12.5 MG PO CAPS: 12.5000 mg | ORAL_CAPSULE | Freq: Every day | ORAL | 1 refills | Status: DC

## 2022-03-30 MED ORDER — MODAFINIL 100 MG PO TABS: 100.0000 mg | ORAL_TABLET | Freq: Every day | ORAL | 0 refills | Status: DC

## 2022-03-30 MED ORDER — DICLOFENAC SODIUM 1 % EX GEL: 2.0000 g | Freq: Four times a day (QID) | CUTANEOUS | 0 refills | Status: DC | PRN

## 2022-03-30 MED ORDER — POTASSIUM CHLORIDE CRYS ER 10 MEQ PO TBCR
10.0000 meq | EXTENDED_RELEASE_TABLET | Freq: Three times a day (TID) | ORAL | Status: DC
  Administered 2022-03-30 – 2022-03-31 (×2): 10 meq via ORAL
  Filled 2022-03-30: qty 1

## 2022-03-30 MED ORDER — VITAMIN D 25 MCG (1000 UNIT) PO TABS: 1000.0000 [IU] | ORAL_TABLET | Freq: Every day | ORAL | 0 refills | Status: AC

## 2022-03-30 MED ORDER — ATORVASTATIN CALCIUM 80 MG PO TABS: 80.0000 mg | ORAL_TABLET | Freq: Every day | ORAL | 0 refills | Status: AC

## 2022-03-30 MED ORDER — AMLODIPINE BESYLATE 10 MG PO TABS: 10.0000 mg | ORAL_TABLET | Freq: Every day | ORAL | 0 refills | Status: DC

## 2022-03-30 MED ORDER — MAGNESIUM GLUCONATE 500 MG PO TABS
500.0000 mg | ORAL_TABLET | Freq: Every day | ORAL | 0 refills | Status: DC
Start: 2022-03-30 — End: 2022-06-05

## 2022-03-30 MED ORDER — POTASSIUM CHLORIDE 20 MEQ PO PACK: 20.0000 meq | PACK | Freq: Every day | ORAL | 0 refills | Status: DC

## 2022-03-30 MED ORDER — PANTOPRAZOLE SODIUM 40 MG PO TBEC: 40.0000 mg | DELAYED_RELEASE_TABLET | Freq: Every day | ORAL | 0 refills | Status: DC

## 2022-03-30 MED ORDER — POTASSIUM CHLORIDE CRYS ER 10 MEQ PO TBCR: 10.0000 meq | EXTENDED_RELEASE_TABLET | Freq: Three times a day (TID) | ORAL | 1 refills | Status: DC

## 2022-03-30 MED ORDER — CLOPIDOGREL BISULFATE 75 MG PO TABS: ORAL_TABLET | ORAL | 2 refills | Status: DC

## 2022-03-30 MED ORDER — LEVOTHYROXINE SODIUM 75 MCG PO TABS: 75.0000 ug | ORAL_TABLET | Freq: Every day | ORAL | 0 refills | Status: AC

## 2022-03-30 MED ORDER — BUPROPION HCL ER (XL) 150 MG PO TB24: 150.0000 mg | ORAL_TABLET | Freq: Every morning | ORAL | 0 refills | Status: DC

## 2022-03-30 MED ORDER — ACETAMINOPHEN 325 MG PO TABS
650.0000 mg | ORAL_TABLET | Freq: Four times a day (QID) | ORAL | Status: DC | PRN
Start: 2022-03-30 — End: 2023-04-09

## 2022-03-30 MED ORDER — VENLAFAXINE HCL ER 75 MG PO CP24: 225.0000 mg | ORAL_CAPSULE | Freq: Every day | ORAL | 0 refills | Status: AC

## 2022-03-30 MED ORDER — ONDANSETRON 4 MG PO TBDP
4.0000 mg | ORAL_TABLET | Freq: Two times a day (BID) | ORAL | Status: DC | PRN
  Administered 2022-03-30: 4 mg via ORAL
  Filled 2022-03-30: qty 1

## 2022-03-30 NOTE — Progress Notes (Signed)
Occupational Therapy Discharge Summary  Patient Details  Name: Jessica [name] MRN: [id number] Date of Birth: Jul 29, 1941  Today's Date: 03/30/2022 OT Individual Time: 1125-1155; 1300-1403 OT Individual Time Calculation (min): 30 min ; and 63 min   Patient has met 14 of 15 long term goals due to improved activity tolerance, improved balance, postural control, ability to compensate for deficits, improved attention, improved awareness, and improved coordination.  Patient still does have slight posterior bias in standing especially when divided attention required but pt is able to self correct with cues only.  Pt is able to complete self care with setup supervision with increased time to complete due to slow processing time and therapist has educated family and encouraged pt extra time to complete ADLs to facilitate increased independence at home. Patient to discharge at overall Supervision.  [name] with 24 supervision provided by two of her daughters and who have been trained during multiple OT sessions and have demonstrated independence to provide the necessary cognitive assistance at discharge. Pts family is also considering hiring part time caregiver aide to ensure safe transition to home.  Skilled Intervention: First session: Pt getting an xray of abdomen at start of OT session therefore missed 10 minutes of treatment.  Upon OT returning, pt asleep in bed, with dtr and grandson at bedside.  Pts dtr reports she has been feeling nauseous and pt nodded in agreement.  MD and nurse already aware. Dtr reports assisting pt in self care this AM with only one concern of pt leaning over to donn socks and feeling that pt was unsafe therefore assisted in donning for her.  This therapist reviewed through visual demo use of figure 4 position when pt donns/doffs socks to increase safety and independence and dtr reported good understanding.  Pt very lethargic during conversation  and nodding off to sleep despite regular cues to facilitate increased alertness. Educated dtr regarding body mechanics during assisting in bed mobility techniques. Pt indicating pain in left shoulder.  Gentle PROM to left shoulder all planes to patients tolerance, and pt felt guarding shoulder prior to end range due to pain.  Applied Ktape for pain reduction over painful region (along suprispinatus tendon insertion) and applied cold packs to area and instructed dtr to remove after 20 minutes.  Pt reports some relief from these measures.  Call bell in reach, family in room at end of session.  Second session:  Pt semi reclined in bed, family arriving soon after OT arrival.  Pt reports "not too much" when asked if her stomach felt nauseous still.  Pt agreeable to moving about the room during caregiver education session.  Pt completed supine to sit with supervision using bed features. Donned sneakers with mod assist due to nausea limiting tolerance to figure 4 position. Pts two dtrs took turns providing close supervision-CGA during self care and functional mobility including sit to stand at RW, ambulated 8 feet to bathroom, completed toilet transfer and toileted with continence of urine, ambulated 5 feet to sink to wash hands, ambulated to recliner stand to sit.  Pt took brief break and ambulated with dtr to doorway and into hall ~15 feet, turn pivot then returned to room and stand to sit at recliner.  Therapist educated both dtrs on verbal cueing to provide pt to promote safe RW mgt, hand placement during transfers, and reducing posterior bias.  Recommended to dtrs that pt complete LB bathing in seated position with lateral leans due to no grab  bar for safe standing in shower.  Pt asked when she can return to driving and OT informed pt that MD must clear her to be able to drive again safely.  Pts dtrs report feeling more confident after family education with no concerns for OT.  Direct hand off to nurse tech for  vitals.  Reasons goals not met: Pt will require supervision for LB dressing for safety awareness and problem solving and goal was set to setup.    Recommendation:  Patient will benefit from ongoing skilled OT services in home health setting to continue to advance functional skills in the area of BADL and iADL.  Equipment: Tub bench and BSC  Reasons for discharge: treatment goals met and discharge from hospital  Patient/family agrees with progress made and goals achieved: Yes  OT Discharge Precautions/Restrictions  Precautions Precautions: Fall Precaution Comments: Aphasia Restrictions Weight Bearing Restrictions: No Pain Pain Assessment Pain Scale: Faces Faces Pain Scale: Hurts even more Pain Type: Acute pain Pain Location: Shoulder Pain Orientation: Mid Pain Descriptors / Indicators: Sharp Pain Onset: With Activity Pain Intervention(s): Cold applied (kinesiotape) ADL ADL Eating: Supervision/safety Where Assessed-Eating: Wheelchair Grooming: Setup Where Assessed-Grooming: Sitting at sink Upper Body Bathing: Setup Where Assessed-Upper Body Bathing: Shower Lower Body Bathing: Supervision/safety Where Assessed-Lower Body Bathing: Shower Upper Body Dressing: Setup Where Assessed-Upper Body Dressing: Sitting at sink Lower Body Dressing: Supervision/safety Where Assessed-Lower Body Dressing: Sitting at sink, Standing at sink Toileting: Supervision/safety Where Assessed-Toileting: Glass blower/designer: Close supervision Toilet Transfer Method: Counselling psychologist: Raised toilet seat, Grab bars Tub/Shower Transfer: Close supervison Clinical cytogeneticist Method: Optometrist: Press photographer: Other (comment) (Would consider shower seat if d/c is to patient's home) ADL Comments: Patient anxious and affect flat on evaluation - overtly declined shower, but agrees to try tomorrow.  Will need port  covered Vision Baseline Vision/History: 1 Wears glasses Patient Visual Report: No change from baseline Vision Assessment?: Vision impaired- to be further tested in functional context Perception  Perception: Impaired Spatial Orientation: Moderate posterior bias Praxis Praxis: Impaired Praxis Impairment Details: Motor planning;Initiation Cognition Cognition Overall Cognitive Status: Impaired/Different from baseline Arousal/Alertness: Lethargic Orientation Level: Person;Place;Situation Person: Oriented Place: Oriented Situation: Oriented Memory: Impaired Memory Impairment: Decreased recall of new information Attention: Selective;Focused;Sustained Focused Attention: Appears intact Sustained Attention: Appears intact Selective Attention: Impaired Selective Attention Impairment: Functional basic Awareness: Impaired Awareness Impairment: Anticipatory impairment;Emergent impairment Problem Solving: Impaired Problem Solving Impairment: Functional complex Sequencing: Appears intact Organizing: Appears intact Initiating: Impaired Initiating Impairment: Functional basic;Verbal basic Safety/Judgment: Appears intact Sensation Sensation Light Touch: Impaired by gross assessment Hot/Cold: Not tested Proprioception: Impaired by gross assessment Stereognosis: Not tested Coordination Gross Motor Movements are Fluid and Coordinated: No Fine Motor Movements are Fluid and Coordinated: Yes Motor  Motor Motor: Hemiplegia;Abnormal postural alignment and control Motor - Discharge Observations: Mild R hemi, persistent posterior lean Mobility  Transfers Sit to Stand: Supervision/Verbal cueing Stand to Sit: Supervision/Verbal cueing  Trunk/Postural Assessment  Cervical Assessment Cervical Assessment: Exceptions to [facility] (forward head) Thoracic Assessment Thoracic Assessment: Exceptions to [facility] (rounded shoulders) Lumbar Assessment Lumbar Assessment: Exceptions to [facility] (posterior pelvic  tilt) Postural Control Postural Control: Deficits on evaluation Righting Reactions: Delayed Postural Limitations: Posterior lean  Balance Balance Balance Assessed: Yes Static Sitting Balance Static Sitting - Balance Support: No upper extremity supported;Feet supported Static Sitting - Level of Assistance: 5: Stand by assistance Dynamic Sitting Balance Dynamic Sitting - Balance Support: No upper extremity supported;Feet supported;During functional activity Dynamic Sitting - Level  of Assistance: 5: Stand by Armed forces logistics/support/administrative officer Standing - Balance Support: Bilateral upper extremity supported Static Standing - Level of Assistance: 5: Stand by assistance Dynamic Standing Balance Dynamic Standing - Balance Support: Bilateral upper extremity supported Dynamic Standing - Level of Assistance: 5: Stand by assistance;4: Min assist Extremity/Trunk Assessment RUE Assessment RUE Assessment: Exceptions to [facility] General Strength Comments: 4-/5 elbow and grip; 3-/5 shoulder LUE Assessment LUE Assessment: Exceptions to [facility] Passive Range of Motion (PROM) Comments: Shoulder FF and abduction limited to 90 degrees due to pt painful and guarding; anticipate could achieve more without pain Active Range of Motion (AROM) Comments: Limited shoulder flex/ext - h/o breast cancer, painful and stiff left shoulder prior to this hospitalization General Strength Comments: deconditioned, NT due to pain   Horris Latino L Mariangel Ringley 03/30/2022, 1:00 PM

## 2022-03-30 NOTE — Progress Notes (Signed)
Recreational Therapy Session Note  Patient Details  Name: Jessica [name] MRN: [id number] Date of Birth: 1940/12/18 Today's Date: 03/30/2022  Pt participated in animal assisted activity seated With supervision.  Pt appreciative of this visit and easily engaged in conversation about pet partner team and her family's dog.  Per policy, hand hygiene performed prior to and after pet interaction. [place] 03/30/2022, 1:54 PM

## 2022-03-30 NOTE — Progress Notes (Addendum)
PROGRESS NOTE   Subjective/Complaints: Nauseated but no vomiting , no new issues  Feels like she needs BM but has reportedly had lg BM yesterday   ROS:   Pt denies SOB, abd pain, CP,, and vision changes  Objective:   No results found. No results for input(s): WBC, HGB, HCT, PLT in the last 72 hours.  Recent Labs    03/30/22 0524  CREATININE 0.74     Intake/Output Summary (Last 24 hours) at 03/30/2022 1005 Last data filed at 03/30/2022 0839 Gross per 24 hour  Intake 487 ml  Output --  Net 487 ml         Physical Exam: Vital Signs Blood pressure (!) 163/68, pulse 74, temperature 97.6 F (36.4 C), temperature source Oral, resp. rate 18, height '5\' 3"'$  (1.6 m), weight 81.5 kg, SpO2 95 %.  General: No acute distress Mood and affect are appropriate Heart: Regular rate and rhythm no rubs murmurs or extra sounds Lungs: Clear to auscultation, breathing unlabored, no rales or wheezes Abdomen: Positive bowel sounds, mildly tender to palpation in all 4 quads,  mildly distended Extremities: No clubbing, cyanosis, or edema  Neurological:  aphasic, but speaking at word level Skin:    General: Skin is warm and dry.     Comments: Left mastectomy scar  Neurological:     Comments: Patient is alert.   Right central 7. RUE 4/5 with decreased Hopkins. LUE 4+ to 5/5. RLE 2+/5 prox to 4/5 distally, LLE 3/5 prox to 4+/5 distally. DTR's 1+. No abnl resting tone.   Psychiatric:     Comments: Flat, cooperative    Assessment/Plan: 1. Functional deficits which require 3+ hours per day of interdisciplinary therapy in a comprehensive inpatient rehab setting. Physiatrist is providing close team supervision and 24 hour management of active medical problems listed below. Physiatrist and rehab team continue to assess barriers to discharge/monitor patient progress toward functional and medical goals  Care Tool:  Bathing    Body parts bathed  by patient: Right arm, Left arm, Chest, Abdomen, Right upper leg, Left upper leg, Face   Body parts bathed by helper: Front perineal area, Buttocks, Right lower leg, Left lower leg     Bathing assist Assist Level: Moderate Assistance - Patient 50 - 74%     Upper Body Dressing/Undressing Upper body dressing   What is the patient wearing?: Bra, Pull over shirt    Upper body assist Assist Level: Maximal Assistance - Patient 25 - 49%    Lower Body Dressing/Undressing Lower body dressing      What is the patient wearing?: Underwear/pull up, Pants     Lower body assist Assist for lower body dressing: Moderate Assistance - Patient 50 - 74%     Toileting Toileting    Toileting assist Assist for toileting: Moderate Assistance - Patient 50 - 74%     Transfers Chair/bed transfer  Transfers assist     Chair/bed transfer assist level: Contact Guard/Touching assist     Locomotion Ambulation   Ambulation assist      Assist level: Contact Guard/Touching assist Assistive device: Walker-rolling Max distance: 279f   Walk 10 feet activity   Assist  Assist level: Contact Guard/Touching assist Assistive device: Walker-rolling   Walk 50 feet activity   Assist Walk 50 feet with 2 turns activity did not occur: Safety/medical concerns (fatigue)  Assist level: Contact Guard/Touching assist Assistive device: Walker-rolling    Walk 150 feet activity   Assist Walk 150 feet activity did not occur: Safety/medical concerns  Assist level: Contact Guard/Touching assist Assistive device: Walker-rolling    Walk 10 feet on uneven surface  activity   Assist Walk 10 feet on uneven surfaces activity did not occur: Safety/medical concerns   Assist level: Contact Guard/Touching assist Assistive device: Walker-rolling   Wheelchair     Assist Is the patient using a wheelchair?: No Type of Wheelchair: Manual    Wheelchair assist level: Dependent - Patient 0%       Wheelchair 50 feet with 2 turns activity    Assist        Assist Level: Dependent - Patient 0%   Wheelchair 150 feet activity     Assist      Assist Level: Dependent - Patient 0%   Blood pressure (!) 163/68, pulse 74, temperature 97.6 F (36.4 C), temperature source Oral, resp. rate 18, height '5\' 3"'$  (1.6 m), weight 81.5 kg, SpO2 95 %.  Medical Problem List and Plan: 1. Functional deficits secondary to acute ischemic infarct left caudate body. 03/20/22             -patient may shower             -ELOS/Goals: 5/30- nauseated today but anticipate pt will be ready for d/c in am   Con't CIR_ PT, OT and SLP 2.  Antithrombotics: -DVT/anticoagulation:  Pharmaceutical: Lovenox             -antiplatelet therapy: Aspirin 81 mg daily and Plavix 75 mg day x3 months then aspirin alone as noted by neurology services Dr.Palikh 3. Shoulder pain: PRN voltaren gel added Tylenol as needed  5/27- will make tylenol 325 mg q6 hours scheduled for pain- forgets to ask and then hurts- voltaren gel doesn't help. 5/28- pain much better with scheduled tylenol per pt.   4. Mood: Wellbutrin 150 mg daily, Effexor 225 mg daily, Remeron 7.5 mg nightly             -antipsychotic agents: N/A             -team to provide ego support as needed 5. Neuropsych: This patient is capable of making decisions on her own behalf. 6. Skin/Wound Care: Routine skin checks 7. Fluids/Electrolytes/Nutrition: Routine in and outs with follow-up chemistries 8.  Hypertension.  Hydrochlorothiazide 12.5 mg daily added tday.  Monitor with increased mobility. Increase amlodipine to '10mg'$  Discontinue TEDs. Vitals:   03/29/22 1937 03/30/22 0619  BP: (!) 162/67 (!) 163/68  Pulse: 69 74  Resp: 18 18  Temp: 98.4 F (36.9 C) 97.6 F (36.4 C)  SpO2: 93% 95%    9.  Hypothyroidism.  Synthroid 10.  Hyperlipidemia.  Lipitor. Pt with history of statin intolerance? 11.  Constipation.  MiraLAX daily, Senokot-S daily. Magnesium  gluconate '250mg'$  added HS  5/27- will give Sorbitol 30 cc after therapy today  5/28- large BM after therapy yesterday-  12.  GERD.  Protonix 13.  History of left breast cancer.  Status postmastectomy/radiation therapy 2018 14. Hyperglycemia: educated regarding choosing foods with low added sugar.  15. Hypokalemia: increase klor to 66mq daily. Does not tolerate powder form in liquid will change to pills , K+ looks in nl range  5/25 will repeat in am , changed powdered K+ to tablet 58mq TID to decrease size of tablet     Latest Ref Rng & Units 03/30/2022    5:24 AM 03/26/2022    9:55 AM 03/24/2022    2:19 PM  BMP  Glucose 70 - 99 mg/dL  112   150    BUN 8 - 23 mg/dL  11   6    Creatinine 0.44 - 1.00 mg/dL 0.74   0.82   0.87    Sodium 135 - 145 mmol/L  140   136    Potassium 3.5 - 5.1 mmol/L  3.5   3.1    Chloride 98 - 111 mmol/L  102   101    CO2 22 - 32 mmol/L  30   28    Calcium 8.9 - 10.3 mg/dL  9.4   8.9      16. Daytime somnolence: Modafinil '100mg'$  daily started. Wait for improved blood pressure control before increasing 17. OSA: refer for outpatient [facility] eval 18.  Abd distension will check KUB  Spoke to daughter and grandson , should be ready for d/c in am    LOS: 7 days A FACE TO FACE EVALUATION WAS PERFORMED  ACharlett Blake5/29/2023, 10:05 AM

## 2022-03-30 NOTE — Progress Notes (Signed)
Physical Therapy Session Note  Patient Details  Name: Jessica [name] MRN: [id number] Date of Birth: 1941/05/07  Today's Date: 03/30/2022 PT Individual Time: 7619-5093 and 1400-1427 PT Individual Time Calculation (min): 41 min and 27 min  Short Term Goals: Week 1:  PT Short Term Goal 1 (Week 1): STG = LTG due to ELOS  Skilled Therapeutic Interventions/Progress Updates:   Treatment Session 1 Received pt sitting in recliner with family present at bedside. Pt agreeable to PT treatment and denied any pain but reported increased nausea this morning - RN notified and notified MD. Session with emphasis on discharge planning, functional mobility/transfers, generalized strengthening and endurance, dynamic standing balance, and gait training. Pt transferred recliner<>WC stand<>pivot with RW and CGA and transported to/from room in [facility] dependently for time management purposes. Pt performed all transfers with RW and CGA throughout session. Pt ambulated 15f on uneven surfaces (ramp) with RW and CGA with min cues for RW safety. RN arrived to administer medications and MD arrived for morning rounds - discussed low potassium and possible constipation. Pt able to stand and pick up small cup from floor using RW and light min A. Pt then ambulated 1771fwith RW and CGA through hallway - cues to widen BOS and increase RLE step length. Interacted with pet therapy dog Dixie, to uplift mood/spirits. Returned to room and [facility] reach and family present at bedside.   Treatment Session 2 Received pt sitting in recliner with family present at bedside. [name] family verbalized confidence with all tasks to ensure safe discharge home and have participated in multiple family education sessions. Pt agreeable to PT treatment and denied any pain during session. Session with emphasis on functional mobility/transfers, generalized strengthening and endurance, dynamic standing  balance/NMR, and gait training. Pt performed all transfers with RW and CGA throughout session and ambulated 15049f 2 trials with RW and CGA to/from dayroom - cues to increase RLE step length and to keep RW wheels on floor with turns. Worked on blocked practice sit<>stands on Airex x5 reps with BUE support on RW and min A - cues for anterior weight shifting and to reach back prior to sitting. Transitioned to bilateral SLS on Airex with BUE support for x10 seconds x 1 and x15 seconds x 1 with CGA for balance - challenged pt with letting go with LUE while standing on LLE, however pt unable to due to fear of LOB. Upon returning to room, pt required cues for direction. Concluded session with pt sitting in recliner with all needs within reach and family present at bedside.   Therapy Documentation Precautions:  Precautions Precautions: Fall Precaution Comments: Aphasia Restrictions Weight Bearing Restrictions: No  Therapy/Group: Individual Therapy AnnAlfonse Alpers, DPT  03/30/2022, 7:05 AM

## 2022-03-30 NOTE — Progress Notes (Signed)
Speech Language Pathology Discharge Summary  Patient Details  Name: Jessica [name] MRN: [id number] Date of Birth: 1941-06-25  Today's Date: 03/30/2022 SLP Individual Time: 1430-1500 SLP Individual Time Calculation (min): 30 min   Skilled Therapeutic Interventions:  Skilled treatment session focused on completion of family education with the patient and her daughter. SLP facilitated session by providing education regarding patient's current swallowing function, diet recommendations, appropriate textures, swallowing compensatory strategies, and medication administration. SLP also provided education regarding speech intelligibility strategies and how to facilitate verbal expression at home. Education was provided on the importance of 24 hour supervision and establishing a routine at home. Patient and her daughter verbalized understanding of all information and handouts were also given to reinforce information. Patient left upright in recliner with daughter present. Continue with current plan of care.     Patient has met 6 of 6 long term goals.  Patient to discharge at [facility] level.   Reasons goals not met: N/A   Clinical Impression/Discharge Summary: Patient has made functional gains and has met 6 of 6 LTGs this admission. Currently, patient is consuming regular textures with thin liquids with minimal overt s/s of aspiration with Min verbal cues for use of swallowing compensatory strategies. Patient also requires overall Min verbal cues for utilization of speech intelligibility strategies at the sentence level to achieve ~75% intelligibility. Min verbal cues are required for emergent awareness of errors and for sustained attention to tasks. Patient and family education is complete and patient will discharge home with 24 hour supervision from family. Patient would benefit from f/u SLP services to maximize her cognitive and swallowing function as well as her functional communication in order to  reduce caregiver burden.   Care Partner:  Caregiver Able to Provide Assistance: Yes  Type of Caregiver Assistance: Physical;Cognitive  Recommendation:  24 hour supervision/assistance;Home Health SLP  Rationale for SLP Follow Up: Reduce caregiver burden;Maximize cognitive function and independence;Maximize swallowing safety;Maximize functional communication   Equipment: N/A   Reasons for discharge: Discharged from hospital;Treatment goals met   Patient/Family Agrees with Progress Made and Goals Achieved: Yes    [place], Agar 03/30/2022, 6:46 AM

## 2022-03-31 ENCOUNTER — Other Ambulatory Visit: Payer: Self-pay | Admitting: Physical Medicine & Rehabilitation

## 2022-03-31 LAB — BASIC METABOLIC PANEL
Anion gap: 8 (ref 5–15)
BUN: 12 mg/dL (ref 8–23)
CO2: 30 mmol/L (ref 22–32)
Calcium: 8.8 mg/dL — ABNORMAL LOW (ref 8.9–10.3)
Chloride: 102 mmol/L (ref 98–111)
Creatinine, Ser: 0.76 mg/dL (ref 0.44–1.00)
GFR, Estimated: >60 mL/min (ref >60)
Glucose, Bld: 102 mg/dL — ABNORMAL HIGH (ref 70–99)
Potassium: 3.6 mmol/L (ref 3.5–5.1)
Sodium: 140 mmol/L (ref 135–145)

## 2022-03-31 MED ORDER — POTASSIUM CHLORIDE CRYS ER 20 MEQ PO TBCR: 20.0000 meq | EXTENDED_RELEASE_TABLET | Freq: Three times a day (TID) | ORAL | Status: DC

## 2022-03-31 NOTE — Progress Notes (Signed)
Inpatient Rehabilitation Care Coordinator Discharge Note   Patient Details  Name: Jessica [name] MRN: [id number] Date of Birth: 03/18/1941   Discharge location: [place] AND OTHER TWO DAUGHTER'S TO ASSIST ALSO  Length of Stay: 8 DAYS  Discharge activity level: SUPERVISION LEVEL  Home/community participation: ACTIVE  Patient response GO:TLXBWI Literacy - How often do you need to have someone help you when you read instructions, pamphlets, or other written material from your doctor or pharmacy?: Never  Patient response OM:BTDHRC Isolation - How often do you feel lonely or isolated from those around you?: Rarely  Services provided included: MD, RD, PT, OT, SLP, RN, CM, Pharmacy  Financial Services:  Financial Services Utilized: [place] offered to/list presented to: PT AND DAUGHTER  Follow-up services arranged:  Home Health, DME, Patient/Family has no preference for HH/DME agencies Home Health Agency: [facility]    DME : [facility], [place] [facility]    Patient response to transportation need: Is the patient able to respond to transportation needs?: Yes In the past 12 months, has lack of transportation kept you from medical appointments or from getting medications?: No In the past 12 months, has lack of transportation kept you from meetings, work, or from getting things needed for daily living?: No    Comments (or additional information):KAREN WAS HERE DAILY AND PARTICIPATED IN HER THERAPIES, OTHER TWO DAUGHTER'S WERE HERE OVER THE WEEKEND TO ATTEND THERAPIES. ALL AWARE OF MOM'S CARE NEEDS. FEEL READY TO DC HOME  Patient/Family verbalized understanding of follow-up arrangements:  Yes  Individual responsible for coordination of the follow-up plan: [facility] [phone]  Confirmed correct DME delivered: Elease Hashimoto 03/31/2022    Jaydynn Wolford, [place]

## 2022-03-31 NOTE — Progress Notes (Signed)
PROGRESS NOTE   Subjective/Complaints: In shower with daughter assisting her Her daughter asks about her blood pressures and I reviewed with her Daughter would like medicines to be provided here.   ROS:   Pt denies SOB, abd pain, CP,, and vision changes  Objective:   DG Abd 1 View  Result Date: 03/30/2022 CLINICAL DATA:  Abdominal distension. EXAM: ABDOMEN - 1 VIEW COMPARISON:  None Available. FINDINGS: Normal bowel gas pattern. There is no bowel dilation to suggest obstruction or significant adynamic ileus. Soft tissues are poorly defined. Linear radiopaque object projects over the right lower quadrant that may be superficial to this patient. No convincing renal or ureteral stone. No acute skeletal abnormality. IMPRESSION: 1. No acute findings. No evidence of bowel obstruction or significant adynamic ileus. Electronically Signed   By: Lajean Manes M.D.   On: 03/30/2022 13:14   No results for input(s): WBC, HGB, HCT, PLT in the last 72 hours.  Recent Labs    03/30/22 0524 03/31/22 0537  NA  --  140  K  --  3.6  CL  --  102  CO2  --  30  GLUCOSE  --  102*  BUN  --  12  CREATININE 0.74 0.76  CALCIUM  --  8.8*     Intake/Output Summary (Last 24 hours) at 03/31/2022 0950 Last data filed at 03/31/2022 0826 Gross per 24 hour  Intake 420 ml  Output --  Net 420 ml        Physical Exam: Vital Signs Blood pressure (!) 160/60, pulse 69, temperature 98.5 F (36.9 C), temperature source Oral, resp. rate 16, height '5\' 3"'$  (1.6 m), weight 81.5 kg, SpO2 93 %. Gen: no distress, normal appearing HEENT: oral mucosa pink and moist, NCAT Cardio: Reg rate Chest: normal effort, normal rate of breathing Abd: soft, non-distended Ext: no edema Psych: pleasant, normal affect Skin: intact  Neurological:  aphasic, but speaking at word level Skin:    General: Skin is warm and dry.     Comments: Left mastectomy scar  Neurological:      Comments: Patient is alert.   Right central 7. RUE 4/5 with decreased [place]. LUE 4+ to 5/5. RLE 2+/5 prox to 4/5 distally, LLE 3/5 prox to 4+/5 distally. DTR's 1+. No abnl resting tone.   Psychiatric:     Comments: Flat, cooperative    Assessment/Plan: 1. Functional deficits which require 3+ hours per day of interdisciplinary therapy in a comprehensive inpatient rehab setting. Physiatrist is providing close team supervision and 24 hour management of active medical problems listed below. Physiatrist and rehab team continue to assess barriers to discharge/monitor patient progress toward functional and medical goals  Care Tool:  Bathing    Body parts bathed by patient: Right arm, Left arm, Chest, Abdomen, Right upper leg, Left upper leg, Face   Body parts bathed by helper: Front perineal area, Buttocks, Right lower leg, Left lower leg     Bathing assist Assist Level: Supervision/Verbal cueing     Upper Body Dressing/Undressing Upper body dressing   What is the patient wearing?: Bra, Pull over shirt    Upper body assist Assist Level: Set up assist  Lower Body Dressing/Undressing Lower body dressing      What is the patient wearing?: Underwear/pull up, Pants     Lower body assist Assist for lower body dressing: Supervision/Verbal cueing     Toileting Toileting    Toileting assist Assist for toileting: Supervision/Verbal cueing     Transfers Chair/bed transfer  Transfers assist     Chair/bed transfer assist level: Contact Guard/Touching assist     Locomotion Ambulation   Ambulation assist      Assist level: Contact Guard/Touching assist Assistive device: Walker-rolling Max distance: >128f   Walk 10 feet activity   Assist     Assist level: Contact Guard/Touching assist Assistive device: Walker-rolling   Walk 50 feet activity   Assist Walk 50 feet with 2 turns activity did not occur: Safety/medical concerns (fatigue)  Assist level: Contact  Guard/Touching assist Assistive device: Walker-rolling    Walk 150 feet activity   Assist Walk 150 feet activity did not occur: Safety/medical concerns  Assist level: Contact Guard/Touching assist Assistive device: Walker-rolling    Walk 10 feet on uneven surface  activity   Assist Walk 10 feet on uneven surfaces activity did not occur: Safety/medical concerns   Assist level: Contact Guard/Touching assist Assistive device: Walker-rolling   Wheelchair     Assist Is the patient using a wheelchair?: No Type of Wheelchair: Manual    Wheelchair assist level: Dependent - Patient 0% Max wheelchair distance: >1540f   Wheelchair 50 feet with 2 turns activity    Assist        Assist Level: Dependent - Patient 0%   Wheelchair 150 feet activity     Assist      Assist Level: Dependent - Patient 0%   Blood pressure (!) 160/60, pulse 69, temperature 98.5 F (36.9 C), temperature source Oral, resp. rate 16, height '5\' 3"'$  (1.6 m), weight 81.5 kg, SpO2 93 %.  Medical Problem List and Plan: 1. Functional deficits secondary to acute ischemic infarct left caudate body. 03/20/22             -patient may shower             -ELOS/Goals: 5/30- nauseated today but anticipate pt will be ready for d/c in am   D.c home today 2.  Antithrombotics: -DVT/anticoagulation:  Pharmaceutical: Lovenox             -antiplatelet therapy: Aspirin 81 mg daily and Plavix 75 mg day x3 months then aspirin alone as noted by neurology services Dr.Palikh 3. Shoulder pain: PRN voltaren gel added Tylenol as needed  5/27- will make tylenol 325 mg q6 hours scheduled for pain- forgets to ask and then hurts- voltaren gel doesn't help. 5/28- pain much better with scheduled tylenol per pt.   4. Mood: Wellbutrin 150 mg daily, Effexor 225 mg daily, Remeron 7.5 mg nightly             -antipsychotic agents: N/A             -team to provide ego support as needed 5. Neuropsych: This patient is capable of  making decisions on her own behalf. 6. Skin/Wound Care: Routine skin checks 7. Fluids/Electrolytes/Nutrition: Routine in and outs with follow-up chemistries 8.  Hypertension.  Hydrochlorothiazide 12.5 mg daily added tday.  Monitor with increased mobility. Increase amlodipine to '10mg'$  Discontinue TEDs. Reviewed Bps with daughter. Increase potassium supplement Vitals:   03/30/22 2015 03/31/22 0424  BP: (!) 163/63 (!) 160/60  Pulse: 64 69  Resp: 18 16  Temp: 98.1 F (36.7 C) 98.5 F (36.9 C)  SpO2: 95% 93%    9.  Hypothyroidism.  Synthroid 10.  Hyperlipidemia.  Lipitor. Pt with history of statin intolerance? 11.  Constipation.  MiraLAX daily, Senokot-S daily. Magnesium gluconate '250mg'$  added HS  5/27- will give Sorbitol 30 cc after therapy today  5/28- large BM after therapy yesterday-  12.  GERD.  Protonix 13.  History of left breast cancer.  Status postmastectomy/radiation therapy 2018 14. Hyperglycemia: educated regarding choosing foods with low added sugar.  15. Hypokalemia: increase kdur to 34mq TID, discussed high potassium foods, repeat BM{ at f/u with [facility]   Latest Ref Rng & Units 03/31/2022    5:37 AM 03/30/2022    5:24 AM 03/26/2022    9:55 AM  BMP  Glucose 70 - 99 mg/dL 102    112    BUN 8 - 23 mg/dL 12    11    Creatinine 0.44 - 1.00 mg/dL 0.76   0.74   0.82    Sodium 135 - 145 mmol/L 140    140    Potassium 3.5 - 5.1 mmol/L 3.6    3.5    Chloride 98 - 111 mmol/L 102    102    CO2 22 - 32 mmol/L 30    30    Calcium 8.9 - 10.3 mg/dL 8.8    9.4      16. Daytime somnolence: Modafinil '100mg'$  daily started. Wait for improved blood pressure control before increasing 17. OSA: refer for outpatient [facility] eval 18.  Abd distension KUB reviewed and shows no obstruction   >30 minutes spent in discharge of patient including review of medications and follow-up appointments, physical examination, and in answering all patient's questions   LOS: 8 days A FACE TO FACE EVALUATION  WAS PERFORMED  KMartha ClanP Kalisha Keadle 03/31/2022, 9:50 AM

## 2022-03-31 NOTE — Progress Notes (Signed)
Inpatient Rehabilitation Discharge Medication Review by a Pharmacist  A complete drug regimen review was completed for this patient to identify any potential clinically significant medication issues.  High Risk Drug Classes Is patient taking? Indication by Medication  Antipsychotic No   Anticoagulant No   Antibiotic No   Opioid No   Antiplatelet Yes Plavix, aspirin for CVA  Hypoglycemics/insulin No   Vasoactive Medication Yes Amlodipine, HCTZ for BP  Chemotherapy No   Other Yes Effexor, Wellbutrin, Modafiniil for mood Synthroid for thyroid Pantoprazole for GERD Atorvastatin for HLD     Type of Medication Issue Identified Description of Issue Recommendation(s)  Drug Interaction(s) (clinically significant)     Duplicate Therapy     Allergy     No Medication Administration End Date     Incorrect Dose     Additional Drug Therapy Needed     Significant med changes from prior encounter (inform family/care partners about these prior to discharge).    Other       Clinically significant medication issues were identified that warrant physician communication and completion of prescribed/recommended actions by midnight of the next day:  No    Time spent performing this drug regimen review (minutes):  30 minutes   Tad Moore 03/31/2022 8:14 AM

## 2022-03-31 NOTE — Telephone Encounter (Signed)
Pharmacy is requesting a 90 day supply

## 2022-04-02 ENCOUNTER — Telehealth: Payer: Self-pay

## 2022-04-02 NOTE — Telephone Encounter (Signed)
Transitional Care call--who you talked with [name], daughter    Are you/is patient experiencing any problems since coming home? NO Are there any questions regarding any aspect of care? NO Are there any questions regarding medications administration/dosing? PATIENT COULD NOT START PROVIGIL DUE TO INSURANCE NOT COVERING IT Are meds being taken as prescribed? Patient should review meds with caller to confirm Have there been any falls? NO Has Home Health been to the house and/or have they contacted you? YES If not, have you tried to contact them? Can we help you contact them? Are bowels and bladder emptying properly? PATIENT HAS NOT HAD A BOWEL MOVEMENT SINCE LEAVING [place] Are there any unexpected incontinence issues? If applicable, is patient following bowel/bladder programs? Any fevers, problems with breathing, unexpected pain? NO Are there any skin problems or new areas of breakdown? NO Has the patient/family member arranged specialty MD follow up (ie cardiology/neurology/renal/surgical/etc)? YES Can we help arrange? Does the patient need any other services or support that we can help arrange? NO Are caregivers following through as expected in assisting the patient? YES Has the patient quit smoking, drinking alcohol, or using drugs as recommended? YES  Appointment time, arrive time and who it is with here 04/09/22 at 1:40 arrival at 1:20 with Dr. Curlene Dolphin [address]

## 2022-04-03 DIAGNOSIS — I69351 Hemiplegia and hemiparesis following cerebral infarction affecting right dominant side: Secondary | ICD-10-CM | POA: Diagnosis not present

## 2022-04-03 DIAGNOSIS — I69392 Facial weakness following cerebral infarction: Secondary | ICD-10-CM | POA: Diagnosis not present

## 2022-04-03 DIAGNOSIS — I69328 Other speech and language deficits following cerebral infarction: Secondary | ICD-10-CM | POA: Diagnosis not present

## 2022-04-03 DIAGNOSIS — I129 Hypertensive chronic kidney disease with stage 1 through stage 4 chronic kidney disease, or unspecified chronic kidney disease: Secondary | ICD-10-CM | POA: Diagnosis not present

## 2022-04-03 DIAGNOSIS — I69320 Aphasia following cerebral infarction: Secondary | ICD-10-CM | POA: Diagnosis not present

## 2022-04-06 ENCOUNTER — Telehealth: Payer: Self-pay | Admitting: Hematology & Oncology

## 2022-04-06 DIAGNOSIS — I129 Hypertensive chronic kidney disease with stage 1 through stage 4 chronic kidney disease, or unspecified chronic kidney disease: Secondary | ICD-10-CM | POA: Diagnosis not present

## 2022-04-06 DIAGNOSIS — I69392 Facial weakness following cerebral infarction: Secondary | ICD-10-CM | POA: Diagnosis not present

## 2022-04-06 DIAGNOSIS — I69351 Hemiplegia and hemiparesis following cerebral infarction affecting right dominant side: Secondary | ICD-10-CM | POA: Diagnosis not present

## 2022-04-06 DIAGNOSIS — I69328 Other speech and language deficits following cerebral infarction: Secondary | ICD-10-CM | POA: Diagnosis not present

## 2022-04-06 DIAGNOSIS — I69320 Aphasia following cerebral infarction: Secondary | ICD-10-CM | POA: Diagnosis not present

## 2022-04-06 NOTE — Telephone Encounter (Signed)
Called to schedule per 5/18 los, patient did not answer left voicemail for call back to schedule appointments

## 2022-04-08 ENCOUNTER — Telehealth: Payer: Self-pay

## 2022-04-08 DIAGNOSIS — I69392 Facial weakness following cerebral infarction: Secondary | ICD-10-CM | POA: Diagnosis not present

## 2022-04-08 DIAGNOSIS — I69320 Aphasia following cerebral infarction: Secondary | ICD-10-CM | POA: Diagnosis not present

## 2022-04-08 DIAGNOSIS — I69328 Other speech and language deficits following cerebral infarction: Secondary | ICD-10-CM | POA: Diagnosis not present

## 2022-04-08 DIAGNOSIS — I129 Hypertensive chronic kidney disease with stage 1 through stage 4 chronic kidney disease, or unspecified chronic kidney disease: Secondary | ICD-10-CM | POA: Diagnosis not present

## 2022-04-08 DIAGNOSIS — I69351 Hemiplegia and hemiparesis following cerebral infarction affecting right dominant side: Secondary | ICD-10-CM | POA: Diagnosis not present

## 2022-04-08 NOTE — Telephone Encounter (Signed)
Verbal in-home Occupational Therapy visit given to Jessica [name] (ph [phone]) : Twice a week for one week, one time a week for one week, once a week for one week. Then zero times a week for one week, once a week for two weeks. To work on  ADL, Transfer, IADL, Pain control, exercise, & motor control. (Per protocol  discharge summary reviewed).

## 2022-04-09 ENCOUNTER — Encounter: Payer: Self-pay | Admitting: Physical Medicine & Rehabilitation

## 2022-04-09 ENCOUNTER — Encounter: Payer: Medicare Other | Attending: Physical Medicine & Rehabilitation | Admitting: Physical Medicine & Rehabilitation

## 2022-04-09 VITALS — BP 158/81 | HR 71 | Ht 63.0 in | Wt 184.8 lb

## 2022-04-09 DIAGNOSIS — K219 Gastro-esophageal reflux disease without esophagitis: Secondary | ICD-10-CM | POA: Insufficient documentation

## 2022-04-09 DIAGNOSIS — I6350 Cerebral infarction due to unspecified occlusion or stenosis of unspecified cerebral artery: Secondary | ICD-10-CM | POA: Diagnosis not present

## 2022-04-09 DIAGNOSIS — G4733 Obstructive sleep apnea (adult) (pediatric): Secondary | ICD-10-CM | POA: Insufficient documentation

## 2022-04-09 DIAGNOSIS — M25512 Pain in left shoulder: Secondary | ICD-10-CM | POA: Insufficient documentation

## 2022-04-09 DIAGNOSIS — E876 Hypokalemia: Secondary | ICD-10-CM | POA: Diagnosis not present

## 2022-04-09 NOTE — Progress Notes (Unsigned)
Subjective:    Patient ID: Jessica [name], female    DOB: [birth year], 81 y.o.   MRN: [id number]  HPI Hospital HPI Brief HPI:   Jessica [name] is a 81 y.o. right-handed female with history of hypertension hyperlipidemia hypothyroidism asthma depression/anxiety left breast cancer status postmastectomy 2018 as well as radiation therapy, obesity with BMI 31.89.  Per chart review lives alone.  Independent prior to admission.  Presented 03/20/2022 with right-sided weakness and slurred speech with altered mental status and facial droop of acute onset.  CT/MRI showed small focus of acute ischemia within the left caudate body.  No hemorrhage or mass effect.  Old right basal ganglia small vessel infarct and findings of chronic microvascular disease.  Patient did not receive tPA.  CT angiogram head and neck no emergent large vessel occlusion.  Echocardiogram ejection fraction of 60 to 65% no wall motion abnormalities.  Admission chemistries unremarkable except potassium 3.3, urine drug screen negative.  Presently maintained on aspirin and Plavix for CVA prophylaxis x3 months then aspirin alone.  Lovenox for DVT prophylaxis.  Therapy evaluations completed due to patient decreased functional mobility was admitted for a comprehensive rehab program.     Hospital Course: Jessica [name] was admitted to rehab 03/23/2022 for inpatient therapies to consist of PT, ST and OT at least three hours five days a week. Past admission physiatrist, therapy team and rehab RN have worked together to provide customized collaborative inpatient rehab.  Pertaining to patient's left caudate body infarction remains stable she will continue on aspirin and Plavix x3 months then aspirin alone.  Mood stabilization with the use of Wellbutrin Effexor as well as Remeron with emotional support provided.  Maintain on Lovenox for DVT prophylaxis no bleeding episodes.  Blood pressure controlled on Norvasc's as well as HCTZ and would need  outpatient follow-up.  Synthroid for hypothyroidism.  Lipitor ongoing for hyperlipidemia.  Bouts of constipation resolved with laxative assistance.  Patient did have a history of breast cancer with mastectomy radiation therapy 2018 follow-up outpatient.  Noted obesity with BMI 31.89 dietary follow-up.  There was some question of OSA ambulatory referral obtained with pulmonary services to establish and evaluate.     Interval history 81 year old female with recent rehabilitation stay for acute ischemic infarct left caudate body.  Patient reports she has been seen by PT and OT at home and she has SLP scheduled.  Overall she has been doing well since her discharge from CIR.  She is here with her daughter.  She feels that her strength and speech has improved since her discharge.  She continues to have some left shoulder pain that is chronic since before her injury, Voltaren is helping to keep this controlled.  Her blood pressure was elevated in clinic today but family reports it was well controlled at home.  They say she has whitecoat hypertension.  She denies depression.  She is not able to provide detailed history, mostly answering in one-word answers.  She reports she has most of her medications available besides the modafinil, there was not approved by insurance.     Pain Inventory Average Pain 0 Pain Right Now 0 My pain is  no pain    BOWEL Number of stools per week: 3-4   BLADDER Pads    Mobility use a walker ability to climb steps?  no do you drive?  no Do you have any goals in this area?  yes  Function retired I need assistance with the following:  feeding,  dressing, bathing, toileting, meal prep, household duties, and shopping  Neuro/Psych bladder control problems trouble walking  Prior Studies Any changes since last visit?  no  Physicians involved in your care Any changes since last visit?  no   Family History  Problem Relation Age of Onset   Heart disease Mother         Angina   Heart disease Father    Cervical cancer Maternal Aunt    Heart attack Brother        Died of MI in his mid 68s   Prostate cancer Brother        dx in his early 61s   Breast cancer Paternal Aunt        dx over 61   Bone cancer Cousin        maternal first cousin dx in her 37s-60s   Lung cancer Cousin        paternal first cousin   Colon cancer Neg Hx    Pancreatic cancer Neg Hx    Stomach cancer Neg Hx    Esophageal cancer Neg Hx    Rectal cancer Neg Hx    Social History   Socioeconomic History   Marital status: Married    Spouse name: Not on file   Number of children: 3   Years of education: Not on file   Highest education level: Not on file  Occupational History   Occupation: retired   Tobacco Use   Smoking status: Never   Smokeless tobacco: Never  Vaping Use   Vaping Use: Never used  Substance and Sexual Activity   Alcohol use: No   Drug use: No   Sexual activity: Not on file  Other Topics Concern   Not on file  Social History Narrative   Married mother of 3 with one grandchild. Never smoked. Does not drink alcohol.   Walks 3-4 days a week for roughly 20-30 minutes.   Social Determinants of Health   Financial Resource Strain: Not on file  Food Insecurity: Not on file  Transportation Needs: Not on file  Physical Activity: Not on file  Stress: Not on file  Social Connections: Not on file   Past Surgical History:  Procedure Laterality Date   ABDOMINAL HYSTERECTOMY     total   BREAST BIOPSY Left 02/24/2017    malignant   BREAST LUMPECTOMY Left 03/23/2017   broken fingers     CATARACT EXTRACTION Bilateral    COLONOSCOPY     EXCISIONAL HEMORRHOIDECTOMY     IR FLUORO GUIDE PORT INSERTION RIGHT  08/04/2017   IR REMOVAL TUN ACCESS W/ PORT W/O FL MOD SED  08/04/2017   IR US GUIDE VASC ACCESS RIGHT  08/04/2017   IRRIGATION AND DEBRIDEMENT ABSCESS Left 05/18/2017   Procedure: IRRIGATION AND DEBRIDEMENT LEFT AXILLARY ABSCESS;  Surgeon: Michael Boston, MD;  Location: [facility];  Service: General;  Laterality: Left;   kidney stones lithotripsy     PORTACATH PLACEMENT Right 03/23/2017   Procedure: INSERTION PORT-A-CATH;  Surgeon: Erroll Luna, MD;  Location: [place] [facility];  Service: General;  Laterality: Right;   RADIOACTIVE SEED GUIDED PARTIAL MASTECTOMY WITH AXILLARY SENTINEL LYMPH NODE BIOPSY Left 03/23/2017   Procedure: LEFT BREAST RADIOACTIVE SEED GUIDED PARTIAL MASTECTOMY AND  SENTINEL LYMPH NODE [place];  Surgeon: Erroll Luna, MD;  Location: [place];  Service: General;  Laterality: Left;   ROTATOR CUFF REPAIR     left    TONSILLECTOMY     WISDOM  TOOTH EXTRACTION     Past Medical History:  Diagnosis Date   Allergy    Anemia    Anxiety    Asthma    in past, no inhalers now   Blood transfusion without reported diagnosis    Breast cancer ([place]) 01/2017   left breast    Breast cancer of upper-inner quadrant of left female breast ([place]) 03/11/2017   Cancer ([place]) 01/2017   left breast   Cataract    bilateral removed   Chronic kidney disease    kidney stones   Depression 08/31/2013   Dyslipidemia, goal LDL below 160 08/31/2013   Essential hypertension - well controlled 08/31/2013   Family history of breast cancer    Family history of melanoma    Family history of prostate cancer    GERD (gastroesophageal reflux disease)    Goals of care, counseling/discussion 03/11/2017   History of radiation therapy 10/13/17-11/11/17   left breast 40.05 Gy in 15 fractions, left breast boost 10 Gy in 5 fractions   Hypothyroidism    Obesity (BMI 30-39.9) 08/31/2013   Personal history of chemotherapy 2018   Personal history of radiation therapy 2019   Thyroid disease    Vasovagal near-syncope -rare 08/31/2013   BP (!) 158/81   Pulse 71   Ht '5\' 3"'$  (1.6 m)   Wt 184 lb 12.8 oz (83.8 kg)   SpO2 92%   BMI 32.74 kg/m   Opioid Risk Score:   Fall Risk Score:  `1  Depression screen PHQ 2/9     04/09/2022     1:51 PM 12/20/2017    3:48 PM 09/27/2017    3:22 PM 04/07/2017   10:06 AM  Depression screen PHQ 2/9  Decreased Interest 1 0 0 0  Down, Depressed, Hopeless 0 0 0 0  PHQ - 2 Score 1 0 0 0  Altered sleeping 0     Tired, decreased energy 2     Change in appetite 0     Feeling bad or failure about yourself  0     Trouble concentrating 0     Moving slowly or fidgety/restless 2     Suicidal thoughts 0     PHQ-9 Score 5     Difficult doing work/chores Not difficult at all        Review of Systems  Constitutional: Negative.   HENT: Negative.    Eyes: Negative.   Respiratory: Negative.    Cardiovascular: Negative.   Gastrointestinal: Negative.   Endocrine: Negative.   Genitourinary:  Positive for difficulty urinating.  Musculoskeletal:  Positive for gait problem.  Skin: Negative.   Allergic/Immunologic: Negative.   Hematological: Negative.   Psychiatric/Behavioral: Negative.        Objective:   Physical Exam    Vitals:   04/09/22 1340  BP: (!) 158/81  Pulse: 71  SpO2: 92%   Gen: no distress, normal appearing HEENT: oral mucosa pink and moist, NCAT Cardio: Reg rate Chest: normal effort, normal rate of breathing Abd: soft, non-distended Ext: no edema Psych: Flat affect Skin: intact Neuro: She is alert and oriented to person place time and situation, cranial nerves II through XII intact other than right facial weakness facial weakness and dysarthria, sensation intact to light touch in all 4 extremities, she is able to repeat 3 words correctly, she is able to name 3 objects correctly, deep tendon reflexes decreased and symmetric throughout Musculoskeletal: Normal resting tone Strength 4 out of 5 throughout right upper extremity, 4- out  of 5 left shoulder abduction due to chronic rotator cuff injury, otherwise left upper extremity 4+ out of 5 Right lower extremity 4 - hip flexion, otherwise 4 out of 5 Left lower extremity 4- out of 5 hip flexion, otherwise 4+ out of 5 L  shoulder pain- using voltaren, chronic injury Walking with walker, step through, decreased step length Left shoulder pain with abduction, chronic     Assessment & Plan:    1. Functional deficits secondary to acute ischemic infarct left caudate body.            -Continue PT OT SLP  -Aspirin 81 mg daily and Plavix 75 mg day x3 months then aspirin alone as noted by neurology services Dr.Palikh  Has medicato  2. Shoulder pain -Continue Tylenol and Voltaren as needed  3.  Hypertension.  Hydrochlorothiazide 12.5 mg daily amlodipine '10mg'$   -She reports her blood pressure is well controlled at home, advise follow-up with primary care physician  4.  GERD.  Protonix, she can also switch back her home Prilosec  5. Hypokalemia: She was discharged on potassium supplement -After visit I noted plans to have BMP completed to f/u on this, I called and advised to have this completed, I ordered BMP lab   6.  Daytime somnolence: Modafinil '100mg'$  daily not approved by insurance, family reports she is participating with therapy and not limited by significant somnolence during this time   7. OSA -Provided counseling regarding benefits of OSA treatment

## 2022-04-10 DIAGNOSIS — I129 Hypertensive chronic kidney disease with stage 1 through stage 4 chronic kidney disease, or unspecified chronic kidney disease: Secondary | ICD-10-CM | POA: Diagnosis not present

## 2022-04-10 DIAGNOSIS — I69328 Other speech and language deficits following cerebral infarction: Secondary | ICD-10-CM | POA: Diagnosis not present

## 2022-04-10 DIAGNOSIS — I69351 Hemiplegia and hemiparesis following cerebral infarction affecting right dominant side: Secondary | ICD-10-CM | POA: Diagnosis not present

## 2022-04-10 DIAGNOSIS — I69320 Aphasia following cerebral infarction: Secondary | ICD-10-CM | POA: Diagnosis not present

## 2022-04-10 DIAGNOSIS — I69392 Facial weakness following cerebral infarction: Secondary | ICD-10-CM | POA: Diagnosis not present

## 2022-04-13 ENCOUNTER — Other Ambulatory Visit: Payer: Self-pay | Admitting: Physical Medicine & Rehabilitation

## 2022-04-13 DIAGNOSIS — E876 Hypokalemia: Secondary | ICD-10-CM | POA: Diagnosis not present

## 2022-04-14 ENCOUNTER — Telehealth (HOSPITAL_COMMUNITY): Payer: Self-pay | Admitting: Physical Medicine & Rehabilitation

## 2022-04-14 DIAGNOSIS — I69392 Facial weakness following cerebral infarction: Secondary | ICD-10-CM | POA: Diagnosis not present

## 2022-04-14 DIAGNOSIS — I69351 Hemiplegia and hemiparesis following cerebral infarction affecting right dominant side: Secondary | ICD-10-CM | POA: Diagnosis not present

## 2022-04-14 DIAGNOSIS — I69320 Aphasia following cerebral infarction: Secondary | ICD-10-CM | POA: Diagnosis not present

## 2022-04-14 DIAGNOSIS — I69328 Other speech and language deficits following cerebral infarction: Secondary | ICD-10-CM | POA: Diagnosis not present

## 2022-04-14 DIAGNOSIS — I129 Hypertensive chronic kidney disease with stage 1 through stage 4 chronic kidney disease, or unspecified chronic kidney disease: Secondary | ICD-10-CM | POA: Diagnosis not present

## 2022-04-14 LAB — BASIC METABOLIC PANEL
BUN/Creatinine Ratio: 21 (ref 12–28)
BUN: 14 mg/dL (ref 8–27)
CO2: 25 mmol/L (ref 20–29)
Calcium: 9.9 mg/dL (ref 8.7–10.3)
Chloride: 99 mmol/L (ref 96–106)
Creatinine, Ser: 0.68 mg/dL (ref 0.57–1.00)
Glucose: 104 mg/dL — ABNORMAL HIGH (ref 70–99)
Potassium: 3.9 mmol/L (ref 3.5–5.2)
Sodium: 139 mmol/L (ref 134–144)
eGFR: 88 mL/min/{1.73_m2} (ref >59)

## 2022-04-15 DIAGNOSIS — I69328 Other speech and language deficits following cerebral infarction: Secondary | ICD-10-CM | POA: Diagnosis not present

## 2022-04-15 DIAGNOSIS — I69392 Facial weakness following cerebral infarction: Secondary | ICD-10-CM | POA: Diagnosis not present

## 2022-04-15 DIAGNOSIS — I129 Hypertensive chronic kidney disease with stage 1 through stage 4 chronic kidney disease, or unspecified chronic kidney disease: Secondary | ICD-10-CM | POA: Diagnosis not present

## 2022-04-15 DIAGNOSIS — I69320 Aphasia following cerebral infarction: Secondary | ICD-10-CM | POA: Diagnosis not present

## 2022-04-15 DIAGNOSIS — I69351 Hemiplegia and hemiparesis following cerebral infarction affecting right dominant side: Secondary | ICD-10-CM | POA: Diagnosis not present

## 2022-04-17 DIAGNOSIS — I69328 Other speech and language deficits following cerebral infarction: Secondary | ICD-10-CM | POA: Diagnosis not present

## 2022-04-17 DIAGNOSIS — I69351 Hemiplegia and hemiparesis following cerebral infarction affecting right dominant side: Secondary | ICD-10-CM | POA: Diagnosis not present

## 2022-04-17 DIAGNOSIS — I129 Hypertensive chronic kidney disease with stage 1 through stage 4 chronic kidney disease, or unspecified chronic kidney disease: Secondary | ICD-10-CM | POA: Diagnosis not present

## 2022-04-17 DIAGNOSIS — I69320 Aphasia following cerebral infarction: Secondary | ICD-10-CM | POA: Diagnosis not present

## 2022-04-17 DIAGNOSIS — I69392 Facial weakness following cerebral infarction: Secondary | ICD-10-CM | POA: Diagnosis not present

## 2022-04-21 DIAGNOSIS — I69351 Hemiplegia and hemiparesis following cerebral infarction affecting right dominant side: Secondary | ICD-10-CM | POA: Diagnosis not present

## 2022-04-21 DIAGNOSIS — I69392 Facial weakness following cerebral infarction: Secondary | ICD-10-CM | POA: Diagnosis not present

## 2022-04-21 DIAGNOSIS — I69320 Aphasia following cerebral infarction: Secondary | ICD-10-CM | POA: Diagnosis not present

## 2022-04-21 DIAGNOSIS — I69328 Other speech and language deficits following cerebral infarction: Secondary | ICD-10-CM | POA: Diagnosis not present

## 2022-04-21 DIAGNOSIS — I129 Hypertensive chronic kidney disease with stage 1 through stage 4 chronic kidney disease, or unspecified chronic kidney disease: Secondary | ICD-10-CM | POA: Diagnosis not present

## 2022-04-22 ENCOUNTER — Other Ambulatory Visit: Payer: Self-pay | Admitting: Physical Medicine & Rehabilitation

## 2022-04-23 DIAGNOSIS — I69328 Other speech and language deficits following cerebral infarction: Secondary | ICD-10-CM | POA: Diagnosis not present

## 2022-04-23 DIAGNOSIS — I69351 Hemiplegia and hemiparesis following cerebral infarction affecting right dominant side: Secondary | ICD-10-CM | POA: Diagnosis not present

## 2022-04-23 DIAGNOSIS — I69392 Facial weakness following cerebral infarction: Secondary | ICD-10-CM | POA: Diagnosis not present

## 2022-04-23 DIAGNOSIS — I69320 Aphasia following cerebral infarction: Secondary | ICD-10-CM | POA: Diagnosis not present

## 2022-04-23 DIAGNOSIS — I129 Hypertensive chronic kidney disease with stage 1 through stage 4 chronic kidney disease, or unspecified chronic kidney disease: Secondary | ICD-10-CM | POA: Diagnosis not present

## 2022-04-27 DIAGNOSIS — I69392 Facial weakness following cerebral infarction: Secondary | ICD-10-CM | POA: Diagnosis not present

## 2022-04-27 DIAGNOSIS — I129 Hypertensive chronic kidney disease with stage 1 through stage 4 chronic kidney disease, or unspecified chronic kidney disease: Secondary | ICD-10-CM | POA: Diagnosis not present

## 2022-04-27 DIAGNOSIS — Z09 Encounter for follow-up examination after completed treatment for conditions other than malignant neoplasm: Secondary | ICD-10-CM | POA: Diagnosis not present

## 2022-04-27 DIAGNOSIS — I69328 Other speech and language deficits following cerebral infarction: Secondary | ICD-10-CM | POA: Diagnosis not present

## 2022-04-27 DIAGNOSIS — E876 Hypokalemia: Secondary | ICD-10-CM | POA: Diagnosis not present

## 2022-04-27 DIAGNOSIS — R7303 Prediabetes: Secondary | ICD-10-CM | POA: Diagnosis not present

## 2022-04-27 DIAGNOSIS — R531 Weakness: Secondary | ICD-10-CM | POA: Diagnosis not present

## 2022-04-27 DIAGNOSIS — I69351 Hemiplegia and hemiparesis following cerebral infarction affecting right dominant side: Secondary | ICD-10-CM | POA: Diagnosis not present

## 2022-04-27 DIAGNOSIS — Z7901 Long term (current) use of anticoagulants: Secondary | ICD-10-CM | POA: Diagnosis not present

## 2022-04-27 DIAGNOSIS — I69320 Aphasia following cerebral infarction: Secondary | ICD-10-CM | POA: Diagnosis not present

## 2022-04-27 DIAGNOSIS — Z8673 Personal history of transient ischemic attack (TIA), and cerebral infarction without residual deficits: Secondary | ICD-10-CM | POA: Diagnosis not present

## 2022-04-27 DIAGNOSIS — N390 Urinary tract infection, site not specified: Secondary | ICD-10-CM | POA: Diagnosis not present

## 2022-04-28 DIAGNOSIS — I69351 Hemiplegia and hemiparesis following cerebral infarction affecting right dominant side: Secondary | ICD-10-CM | POA: Diagnosis not present

## 2022-04-28 DIAGNOSIS — I69320 Aphasia following cerebral infarction: Secondary | ICD-10-CM | POA: Diagnosis not present

## 2022-04-28 DIAGNOSIS — I69328 Other speech and language deficits following cerebral infarction: Secondary | ICD-10-CM | POA: Diagnosis not present

## 2022-04-28 DIAGNOSIS — I69392 Facial weakness following cerebral infarction: Secondary | ICD-10-CM | POA: Diagnosis not present

## 2022-04-28 DIAGNOSIS — I129 Hypertensive chronic kidney disease with stage 1 through stage 4 chronic kidney disease, or unspecified chronic kidney disease: Secondary | ICD-10-CM | POA: Diagnosis not present

## 2022-04-29 NOTE — Progress Notes (Signed)
HPI F followed for OSA, complicated by  L breast CA/ partial mastectomy/XRT/chemo, HBP, GERD, Hypothyroid, ObesityROS HST 03/03/2019 AHI 69.7, desaturation to 81%, body weight 171 lbs -------------------------------------------------------------------- 04/24/2019-  81 yo F followed for OSA, complicated by  L breast CA/ partial mastectomy/XRT/chemo, HBP, GERD, Hypothyroid, Obesity HST 03/03/2019 AHI 69.7, desaturation to 81%, body weight 171 lbs -----OSA on CPAP auto 5-16, DME: [employer]; pt states the pressure of her CPAP is uncomfortable for her eyes Body weight today- 176 lbs CPAP auto 5-16/ [employer] Download compliance 60%, AHI 7.5/ hr     Ranges 9.9- 14.4 Not sleeping well. Sonata gave "crazy dreams" and she changed to tylenol.  04/30/22- 64 yo F previously followed for OSA, complicated by  L breast CA/ partial mastectomy/XRT/chemo, HTN, GERD, Hypothyroid, Obesity, CVA,  Now returns to re-establish, courtesy of Dr Ranell Patrick- Physical Medicine Covid vax- 2 Phizer Epworth score- 20 CPAP auto 5-16/ [employer] Download compliance  Body weight today-175 lbs Hosp in May for L thalamic CVA with slurred speech/R body weakness -----Patient's daughter states that patient has known OSA. States that she tried CPAP and oral appliance and was not able to tolerate both. Daughter states that she snores very loud, is tired during the day and gasps during sleep. Wakes up 2-4 times a night with with another daughter.  ROS-see HPI   + = positive Constitutional:    weight loss, night sweats, fevers, chills, fatigue, lassitude. HEENT:    headaches, +difficulty swallowing, tooth/dental problems, sore throat,       sneezing, itching, ear ache, nasal congestion, post nasal drip, snoring CV:    chest pain, orthopnea, PND, swelling in lower extremities, anasarca,                                   dizziness, palpitations Resp:   shortness of breath with exertion or at rest.                productive cough,   non-productive cough,  coughing up of blood.              change in color of mucus.  wheezing.   Skin:    rash or lesions. GI:  No-   heartburn,+ indigestion, abdominal pain, nausea, vomiting, diarrhea,                 change in bowel habits, loss of appetite GU: dysuria, change in color of urine, no urgency or frequency.   flank pain. MS:  + joint pain, stiffness, decreased range of motion, back pain. Neuro-     nothing unusual Psych:  change in mood or affect.  +depression or anxiety.   memory loss.  OBJ- Physical Exam General- Alert, Oriented, Affect- quiet/ reserved, Distress- none acute, +obese Skin- rash-none, lesions- none, excoriation- none Lymphadenopathy- none Head- atraumatic            Eyes- Gross vision intact, PERRLA, conjunctivae and secretions clear            Ears- Hearing, canals-normal            Nose- Clear, no-Septal dev, mucus, polyps, erosion, perforation             Throat- Mallampati IV , mucosa clear , drainage- none, tonsils- atrophic,+ own teeth Neck- flexible , trachea midline, no stridor , thyroid nl, carotid no bruit Chest - symmetrical excursion , unlabored  Heart/CV- RRR , no murmur , no gallop  , no rub, nl s1 s2                           - JVD- none , edema- none, stasis changes- none, varices- none           Lung- clear to P&A, wheeze- none, cough- none , dullness-none, rub- none           Chest wall-  Abd-  Br/ Gen/ Rectal- Not done, not indicated Extrem- cyanosis- none, clubbing, none, atrophy- none, strength- nl Neuro- grossly intact to observation, + limited speech (yes/no), moves R hand weakly

## 2022-04-30 ENCOUNTER — Ambulatory Visit (INDEPENDENT_AMBULATORY_CARE_PROVIDER_SITE_OTHER): Payer: Medicare Other | Admitting: Internal Medicine

## 2022-04-30 ENCOUNTER — Encounter: Payer: Self-pay | Admitting: Internal Medicine

## 2022-04-30 VITALS — BP 110/70 | HR 73 | Temp 98.2°F | Ht 63.0 in | Wt 175.0 lb

## 2022-04-30 DIAGNOSIS — G4733 Obstructive sleep apnea (adult) (pediatric): Secondary | ICD-10-CM | POA: Diagnosis not present

## 2022-04-30 DIAGNOSIS — I69320 Aphasia following cerebral infarction: Secondary | ICD-10-CM | POA: Diagnosis not present

## 2022-04-30 DIAGNOSIS — I129 Hypertensive chronic kidney disease with stage 1 through stage 4 chronic kidney disease, or unspecified chronic kidney disease: Secondary | ICD-10-CM | POA: Diagnosis not present

## 2022-04-30 DIAGNOSIS — G4701 Insomnia due to medical condition: Secondary | ICD-10-CM

## 2022-04-30 DIAGNOSIS — I69351 Hemiplegia and hemiparesis following cerebral infarction affecting right dominant side: Secondary | ICD-10-CM | POA: Diagnosis not present

## 2022-04-30 DIAGNOSIS — I69328 Other speech and language deficits following cerebral infarction: Secondary | ICD-10-CM | POA: Diagnosis not present

## 2022-04-30 DIAGNOSIS — I69392 Facial weakness following cerebral infarction: Secondary | ICD-10-CM | POA: Diagnosis not present

## 2022-04-30 NOTE — Patient Instructions (Signed)
Order- schedule Split night sleep study at sleep center     Dx OSA, CVA,  Will need one-on-one limited mobility  Please call us about 2 weeks after the sleep study for results and recommendations

## 2022-05-01 DIAGNOSIS — R1310 Dysphagia, unspecified: Secondary | ICD-10-CM | POA: Diagnosis not present

## 2022-05-01 DIAGNOSIS — I6381 Other cerebral infarction due to occlusion or stenosis of small artery: Secondary | ICD-10-CM | POA: Diagnosis not present

## 2022-05-04 ENCOUNTER — Telehealth: Payer: Self-pay

## 2022-05-04 DIAGNOSIS — I69320 Aphasia following cerebral infarction: Secondary | ICD-10-CM | POA: Diagnosis not present

## 2022-05-04 DIAGNOSIS — I129 Hypertensive chronic kidney disease with stage 1 through stage 4 chronic kidney disease, or unspecified chronic kidney disease: Secondary | ICD-10-CM | POA: Diagnosis not present

## 2022-05-04 DIAGNOSIS — I69328 Other speech and language deficits following cerebral infarction: Secondary | ICD-10-CM | POA: Diagnosis not present

## 2022-05-04 DIAGNOSIS — I69392 Facial weakness following cerebral infarction: Secondary | ICD-10-CM | POA: Diagnosis not present

## 2022-05-04 DIAGNOSIS — I69351 Hemiplegia and hemiparesis following cerebral infarction affecting right dominant side: Secondary | ICD-10-CM | POA: Diagnosis not present

## 2022-05-04 NOTE — Telephone Encounter (Signed)
Nori Riis, OT/[facility] verbal orders  2wk1, 2wk2. Orders approved and given.

## 2022-05-05 DIAGNOSIS — I69320 Aphasia following cerebral infarction: Secondary | ICD-10-CM | POA: Diagnosis not present

## 2022-05-05 DIAGNOSIS — I69392 Facial weakness following cerebral infarction: Secondary | ICD-10-CM | POA: Diagnosis not present

## 2022-05-05 DIAGNOSIS — I129 Hypertensive chronic kidney disease with stage 1 through stage 4 chronic kidney disease, or unspecified chronic kidney disease: Secondary | ICD-10-CM | POA: Diagnosis not present

## 2022-05-05 DIAGNOSIS — I69328 Other speech and language deficits following cerebral infarction: Secondary | ICD-10-CM | POA: Diagnosis not present

## 2022-05-05 DIAGNOSIS — I69351 Hemiplegia and hemiparesis following cerebral infarction affecting right dominant side: Secondary | ICD-10-CM | POA: Diagnosis not present

## 2022-05-06 DIAGNOSIS — I129 Hypertensive chronic kidney disease with stage 1 through stage 4 chronic kidney disease, or unspecified chronic kidney disease: Secondary | ICD-10-CM | POA: Diagnosis not present

## 2022-05-06 DIAGNOSIS — I69320 Aphasia following cerebral infarction: Secondary | ICD-10-CM | POA: Diagnosis not present

## 2022-05-06 DIAGNOSIS — I69351 Hemiplegia and hemiparesis following cerebral infarction affecting right dominant side: Secondary | ICD-10-CM | POA: Diagnosis not present

## 2022-05-06 DIAGNOSIS — I69328 Other speech and language deficits following cerebral infarction: Secondary | ICD-10-CM | POA: Diagnosis not present

## 2022-05-06 DIAGNOSIS — I69392 Facial weakness following cerebral infarction: Secondary | ICD-10-CM | POA: Diagnosis not present

## 2022-05-08 DIAGNOSIS — I69351 Hemiplegia and hemiparesis following cerebral infarction affecting right dominant side: Secondary | ICD-10-CM | POA: Diagnosis not present

## 2022-05-08 DIAGNOSIS — I129 Hypertensive chronic kidney disease with stage 1 through stage 4 chronic kidney disease, or unspecified chronic kidney disease: Secondary | ICD-10-CM | POA: Diagnosis not present

## 2022-05-08 DIAGNOSIS — I69328 Other speech and language deficits following cerebral infarction: Secondary | ICD-10-CM | POA: Diagnosis not present

## 2022-05-08 DIAGNOSIS — I69320 Aphasia following cerebral infarction: Secondary | ICD-10-CM | POA: Diagnosis not present

## 2022-05-08 DIAGNOSIS — I69392 Facial weakness following cerebral infarction: Secondary | ICD-10-CM | POA: Diagnosis not present

## 2022-05-11 DIAGNOSIS — I129 Hypertensive chronic kidney disease with stage 1 through stage 4 chronic kidney disease, or unspecified chronic kidney disease: Secondary | ICD-10-CM | POA: Diagnosis not present

## 2022-05-11 DIAGNOSIS — I69351 Hemiplegia and hemiparesis following cerebral infarction affecting right dominant side: Secondary | ICD-10-CM | POA: Diagnosis not present

## 2022-05-11 DIAGNOSIS — I69320 Aphasia following cerebral infarction: Secondary | ICD-10-CM | POA: Diagnosis not present

## 2022-05-11 DIAGNOSIS — I69392 Facial weakness following cerebral infarction: Secondary | ICD-10-CM | POA: Diagnosis not present

## 2022-05-11 DIAGNOSIS — I69328 Other speech and language deficits following cerebral infarction: Secondary | ICD-10-CM | POA: Diagnosis not present

## 2022-05-13 ENCOUNTER — Telehealth: Payer: Self-pay

## 2022-05-13 DIAGNOSIS — I69320 Aphasia following cerebral infarction: Secondary | ICD-10-CM | POA: Diagnosis not present

## 2022-05-13 DIAGNOSIS — I129 Hypertensive chronic kidney disease with stage 1 through stage 4 chronic kidney disease, or unspecified chronic kidney disease: Secondary | ICD-10-CM | POA: Diagnosis not present

## 2022-05-13 DIAGNOSIS — I69328 Other speech and language deficits following cerebral infarction: Secondary | ICD-10-CM | POA: Diagnosis not present

## 2022-05-13 DIAGNOSIS — I69351 Hemiplegia and hemiparesis following cerebral infarction affecting right dominant side: Secondary | ICD-10-CM | POA: Diagnosis not present

## 2022-05-13 DIAGNOSIS — I69392 Facial weakness following cerebral infarction: Secondary | ICD-10-CM | POA: Diagnosis not present

## 2022-05-13 NOTE — Telephone Encounter (Signed)
[place], ST from [facility] called requesting skilled ST for communication and dysphagia. Approved orders

## 2022-05-14 DIAGNOSIS — E669 Obesity, unspecified: Secondary | ICD-10-CM

## 2022-05-14 DIAGNOSIS — F419 Anxiety disorder, unspecified: Secondary | ICD-10-CM

## 2022-05-14 DIAGNOSIS — Z9012 Acquired absence of left breast and nipple: Secondary | ICD-10-CM

## 2022-05-14 DIAGNOSIS — I69320 Aphasia following cerebral infarction: Secondary | ICD-10-CM | POA: Diagnosis not present

## 2022-05-14 DIAGNOSIS — I69351 Hemiplegia and hemiparesis following cerebral infarction affecting right dominant side: Secondary | ICD-10-CM | POA: Diagnosis not present

## 2022-05-14 DIAGNOSIS — I69328 Other speech and language deficits following cerebral infarction: Secondary | ICD-10-CM | POA: Diagnosis not present

## 2022-05-14 DIAGNOSIS — N189 Chronic kidney disease, unspecified: Secondary | ICD-10-CM | POA: Diagnosis not present

## 2022-05-14 DIAGNOSIS — I129 Hypertensive chronic kidney disease with stage 1 through stage 4 chronic kidney disease, or unspecified chronic kidney disease: Secondary | ICD-10-CM | POA: Diagnosis not present

## 2022-05-14 DIAGNOSIS — I69392 Facial weakness following cerebral infarction: Secondary | ICD-10-CM | POA: Diagnosis not present

## 2022-05-14 DIAGNOSIS — Z7982 Long term (current) use of aspirin: Secondary | ICD-10-CM

## 2022-05-14 DIAGNOSIS — J45909 Unspecified asthma, uncomplicated: Secondary | ICD-10-CM | POA: Diagnosis not present

## 2022-05-14 DIAGNOSIS — Z853 Personal history of malignant neoplasm of breast: Secondary | ICD-10-CM

## 2022-05-14 DIAGNOSIS — F32A Depression, unspecified: Secondary | ICD-10-CM | POA: Diagnosis not present

## 2022-05-14 DIAGNOSIS — Z9181 History of falling: Secondary | ICD-10-CM

## 2022-05-14 DIAGNOSIS — E039 Hypothyroidism, unspecified: Secondary | ICD-10-CM | POA: Diagnosis not present

## 2022-05-14 DIAGNOSIS — E785 Hyperlipidemia, unspecified: Secondary | ICD-10-CM | POA: Diagnosis not present

## 2022-05-14 DIAGNOSIS — Z6831 Body mass index (BMI) 31.0-31.9, adult: Secondary | ICD-10-CM

## 2022-05-18 DIAGNOSIS — D508 Other iron deficiency anemias: Secondary | ICD-10-CM | POA: Diagnosis not present

## 2022-05-18 DIAGNOSIS — D72810 Lymphocytopenia: Secondary | ICD-10-CM | POA: Diagnosis not present

## 2022-05-19 ENCOUNTER — Encounter: Payer: Self-pay | Admitting: Internal Medicine

## 2022-05-19 DIAGNOSIS — I69320 Aphasia following cerebral infarction: Secondary | ICD-10-CM | POA: Diagnosis not present

## 2022-05-19 DIAGNOSIS — I69392 Facial weakness following cerebral infarction: Secondary | ICD-10-CM | POA: Diagnosis not present

## 2022-05-19 DIAGNOSIS — I129 Hypertensive chronic kidney disease with stage 1 through stage 4 chronic kidney disease, or unspecified chronic kidney disease: Secondary | ICD-10-CM | POA: Diagnosis not present

## 2022-05-19 DIAGNOSIS — I69328 Other speech and language deficits following cerebral infarction: Secondary | ICD-10-CM | POA: Diagnosis not present

## 2022-05-19 DIAGNOSIS — I69351 Hemiplegia and hemiparesis following cerebral infarction affecting right dominant side: Secondary | ICD-10-CM | POA: Diagnosis not present

## 2022-05-19 NOTE — Assessment & Plan Note (Signed)
Short sleep latency but then wakes 2-4x/ night.

## 2022-05-19 NOTE — Assessment & Plan Note (Signed)
She will need to update documentation with a new sleep study. Then consider options. Given her age and neurologic deficits, I am not sure what we will be able to offer that she would be comfortable using.

## 2022-05-20 DIAGNOSIS — I69320 Aphasia following cerebral infarction: Secondary | ICD-10-CM | POA: Diagnosis not present

## 2022-05-20 DIAGNOSIS — I69351 Hemiplegia and hemiparesis following cerebral infarction affecting right dominant side: Secondary | ICD-10-CM | POA: Diagnosis not present

## 2022-05-20 DIAGNOSIS — I69328 Other speech and language deficits following cerebral infarction: Secondary | ICD-10-CM | POA: Diagnosis not present

## 2022-05-20 DIAGNOSIS — I69392 Facial weakness following cerebral infarction: Secondary | ICD-10-CM | POA: Diagnosis not present

## 2022-05-20 DIAGNOSIS — I129 Hypertensive chronic kidney disease with stage 1 through stage 4 chronic kidney disease, or unspecified chronic kidney disease: Secondary | ICD-10-CM | POA: Diagnosis not present

## 2022-05-21 DIAGNOSIS — I69320 Aphasia following cerebral infarction: Secondary | ICD-10-CM | POA: Diagnosis not present

## 2022-05-21 DIAGNOSIS — I129 Hypertensive chronic kidney disease with stage 1 through stage 4 chronic kidney disease, or unspecified chronic kidney disease: Secondary | ICD-10-CM | POA: Diagnosis not present

## 2022-05-21 DIAGNOSIS — I69351 Hemiplegia and hemiparesis following cerebral infarction affecting right dominant side: Secondary | ICD-10-CM | POA: Diagnosis not present

## 2022-05-21 DIAGNOSIS — I69392 Facial weakness following cerebral infarction: Secondary | ICD-10-CM | POA: Diagnosis not present

## 2022-05-21 DIAGNOSIS — I69328 Other speech and language deficits following cerebral infarction: Secondary | ICD-10-CM | POA: Diagnosis not present

## 2022-05-26 ENCOUNTER — Encounter: Payer: Self-pay | Admitting: Diagnostic Neuroimaging

## 2022-05-26 ENCOUNTER — Ambulatory Visit: Payer: Medicare Other | Admitting: Diagnostic Neuroimaging

## 2022-05-26 VITALS — BP 151/72 | HR 75 | Ht 63.0 in | Wt 172.1 lb

## 2022-05-26 DIAGNOSIS — I6381 Other cerebral infarction due to occlusion or stenosis of small artery: Secondary | ICD-10-CM | POA: Diagnosis not present

## 2022-05-26 NOTE — Patient Instructions (Signed)
LEFT basal ganglia stroke (likely small vessel thrombosis; hyperlipidemia and HTN) - complete DAPT (aspirin + plavix x 3 months; then aspirin '81mg'$  daily alone) - continue atorvastatin '80mg'$  daily - continue BP control - history of sleep apnea; intolerant of CPAP in past

## 2022-05-26 NOTE — Progress Notes (Signed)
[facility]  PATIENT: Jessica [name] DOB: 26-Jun-1941  REFERRING CLINICIAN: Edwin Dada,* HISTORY FROM: patient REASON FOR VISIT: new consult   HISTORICAL  CHIEF COMPLAINT:  Chief Complaint  Patient presents with   New Patient (Initial Visit)    Pt is well. Room 7 with daughter     HISTORY OF PRESENT ILLNESS:    81 year old female here for evaluation of stroke.  History of hypertension, hyperlipidemia, hypothyroidism, asthma, breast cancer, presented to the hospital on 03/20/2022 with right-sided weakness and slurred speech.  Patient found to have acute left basal ganglia ischemic infarction.  Old right basal ganglia infarct was also noted, stable from 2021 or earlier.  Stroke work-up was completed.  Patient went to inpatient rehabilitation and then home.  Overall doing well.  Still working on balance and coordination.  Tolerating medications.    REVIEW OF SYSTEMS: Full 14 system review of systems performed and negative with exception of: As per HPI.  ALLERGIES: Allergies  Allergen Reactions   Advil [Ibuprofen] Hives and Itching   Codeine Hives   Penicillins Hives    Has patient had a PCN reaction causing immediate rash, facial/tongue/throat swelling, SOB or lightheadedness with hypotension: yes Has patient had a PCN reaction causing severe rash involving mucus membranes or skin necrosis:  no Has patient had a PCN reaction that required hospitalization:  no Has patient had a PCN reaction occurring within the last 10 years: no If all of the above answers are "NO", then may proceed with Cephalosporin use. Has patient had a PCN reaction causing immediate rash, facial/tongue/throat swelling, SOB or lightheadedness with hypotension: yes Has patient had a PCN reaction causing severe rash involving mucus membranes or skin necrosis:  no Has patient had a PCN reaction that required hospitalization:  no Has patient had a PCN reaction occurring  within the last 10 years: no If all of the above answers are "NO", then may proceed with Cephalosporin use.    Sulfamethoxazole-Trimethoprim Hives   Band-Aid Plus Antibiotic [Bacitracin-Polymyxin B] Dermatitis   Benazepril Swelling    Possible cause of tongue swelling   Other Rash    Band-aid    HOME MEDICATIONS: Outpatient Medications Prior to Visit  Medication Sig Dispense Refill   acetaminophen (TYLENOL) 325 MG tablet Take 2 tablets (650 mg total) by mouth every 6 (six) hours as needed for mild pain (or Fever >/= 101).     amLODipine (NORVASC) 10 MG tablet Take 1 tablet (10 mg total) by mouth daily. 30 tablet 0   aspirin 81 MG chewable tablet Chew 1 tablet (81 mg total) by mouth daily. 30 tablet 3   atorvastatin (LIPITOR) 80 MG tablet Take 1 tablet (80 mg total) by mouth at bedtime. 30 tablet 0   buPROPion (WELLBUTRIN XL) 150 MG 24 hr tablet Take 1 tablet (150 mg total) by mouth every morning. 30 tablet 0   cholecalciferol (VITAMIN D3) 25 MCG (1000 UNIT) tablet Take 1 tablet (1,000 Units total) by mouth daily. 30 tablet 0   clopidogrel (PLAVIX) 75 MG tablet 1 tablet daily through 06/24/2022 and stop 30 tablet 2   diclofenac Sodium (VOLTAREN) 1 % GEL Apply 2 g topically 4 (four) times daily as needed (shoulder pain). 2 g 0   hydrochlorothiazide (MICROZIDE) 12.5 MG capsule Take 1 capsule (12.5 mg total) by mouth daily. 30 capsule 1   levothyroxine (SYNTHROID) 75 MCG tablet Take 1 tablet (75 mcg total) by mouth daily. 30 tablet 0   Multiple Vitamins-Minerals (OCUVITE  ADULT FORMULA PO) Take by mouth daily.     OMEPRAZOLE PO Take by mouth.     potassium chloride (KLOR-CON M) 10 MEQ tablet Take 1 tablet (10 mEq total) by mouth 3 (three) times daily. 90 tablet 1   venlafaxine XR (EFFEXOR-XR) 75 MG 24 hr capsule Take 3 capsules (225 mg total) by mouth daily. 90 capsule 0   magnesium gluconate (MAGONATE) 500 MG tablet Take 1 tablet (500 mg total) by mouth at bedtime. (Patient not taking:  Reported on 05/26/2022) 30 tablet 0   modafinil (PROVIGIL) 100 MG tablet Take 1 tablet (100 mg total) by mouth daily. (Patient not taking: Reported on 05/26/2022) 30 tablet 0   Multiple Vitamin (MULTIVITAMIN) tablet Take 1 tablet by mouth daily. (Patient not taking: Reported on 05/26/2022)     nitrofurantoin, macrocrystal-monohydrate, (MACROBID) 100 MG capsule Take 100 mg by mouth every 12 (twelve) hours. (Patient not taking: Reported on 05/26/2022)     pantoprazole (PROTONIX) 40 MG tablet Take 1 tablet (40 mg total) by mouth daily. (Patient not taking: Reported on 05/26/2022) 30 tablet 0   polyethylene glycol (MIRALAX / GLYCOLAX) 17 g packet Take 17 g by mouth daily. (Patient not taking: Reported on 05/26/2022) 14 each 0   No facility-administered medications prior to visit.    PAST MEDICAL HISTORY: Past Medical History:  Diagnosis Date   Allergy    Anemia    Anxiety    Asthma    in past, no inhalers now   Blood transfusion without reported diagnosis    Breast cancer ([place]) 01/2017   left breast    Breast cancer of upper-inner quadrant of left female breast ([place]) 03/11/2017   Cancer ([place]) 01/2017   left breast   Cataract    bilateral removed   Chronic kidney disease    kidney stones   Depression 08/31/2013   Dyslipidemia, goal LDL below 160 08/31/2013   Essential hypertension - well controlled 08/31/2013   Family history of breast cancer    Family history of melanoma    Family history of prostate cancer    GERD (gastroesophageal reflux disease)    Goals of care, counseling/discussion 03/11/2017   History of radiation therapy 10/13/17-11/11/17   left breast 40.05 Gy in 15 fractions, left breast boost 10 Gy in 5 fractions   Hypothyroidism    Obesity (BMI 30-39.9) 08/31/2013   Personal history of chemotherapy 2018   Personal history of radiation therapy 2019   Thyroid disease    Vasovagal near-syncope -rare 08/31/2013    PAST SURGICAL HISTORY: Past Surgical History:  Procedure  Laterality Date   ABDOMINAL HYSTERECTOMY     total   BREAST BIOPSY Left 02/24/2017    malignant   BREAST LUMPECTOMY Left 03/23/2017   broken fingers     CATARACT EXTRACTION Bilateral    COLONOSCOPY     EXCISIONAL HEMORRHOIDECTOMY     IR FLUORO GUIDE PORT INSERTION RIGHT  08/04/2017   IR REMOVAL TUN ACCESS W/ PORT W/O FL MOD SED  08/04/2017   IR US GUIDE VASC ACCESS RIGHT  08/04/2017   IRRIGATION AND DEBRIDEMENT ABSCESS Left 05/18/2017   Procedure: IRRIGATION AND DEBRIDEMENT LEFT AXILLARY ABSCESS;  Surgeon: Michael Boston, MD;  Location: [facility];  Service: General;  Laterality: Left;   kidney stones lithotripsy     PORTACATH PLACEMENT Right 03/23/2017   Procedure: INSERTION PORT-A-CATH;  Surgeon: Erroll Luna, MD;  Location: [facility];  Service: General;  Laterality: Right;   RADIOACTIVE SEED GUIDED PARTIAL  MASTECTOMY WITH AXILLARY SENTINEL LYMPH NODE BIOPSY Left 03/23/2017   Procedure: LEFT BREAST RADIOACTIVE SEED GUIDED PARTIAL MASTECTOMY AND  SENTINEL LYMPH NODE MAPPING;  Surgeon: Erroll Luna, MD;  Location: [facility];  Service: General;  Laterality: Left;   ROTATOR CUFF REPAIR     left    TONSILLECTOMY     WISDOM TOOTH EXTRACTION      FAMILY HISTORY: Family History  Problem Relation Age of Onset   Heart disease Mother        Angina   Heart disease Father    Cervical cancer Maternal Aunt    Heart attack Brother        Died of MI in his mid 63s   Prostate cancer Brother        dx in his early 11s   Breast cancer Paternal Aunt        dx over 60   Bone cancer Cousin        maternal first cousin dx in her 53s-60s   Lung cancer Cousin        paternal first cousin   Colon cancer Neg Hx    Pancreatic cancer Neg Hx    Stomach cancer Neg Hx    Esophageal cancer Neg Hx    Rectal cancer Neg Hx     SOCIAL HISTORY: Social History   Socioeconomic History   Marital status: Married    Spouse name: Not on file   Number of children: 3   Years of  education: Not on file   Highest education level: Not on file  Occupational History   Occupation: retired   Tobacco Use   Smoking status: Never    Passive exposure: Past   Smokeless tobacco: Never  Vaping Use   Vaping Use: Never used  Substance and Sexual Activity   Alcohol use: No   Drug use: No   Sexual activity: Not on file  Other Topics Concern   Not on file  Social History Narrative   Married mother of 3 with one grandchild. Never smoked. Does not drink alcohol.   Walks 3-4 days a week for roughly 20-30 minutes.   Social Determinants of Health   Financial Resource Strain: Not on file  Food Insecurity: Not on file  Transportation Needs: Not on file  Physical Activity: Not on file  Stress: Not on file  Social Connections: Not on file  Intimate Partner Violence: Not on file     PHYSICAL EXAM  GENERAL EXAM/CONSTITUTIONAL: Vitals:  Vitals:   05/26/22 1101  BP: (!) 151/72  Pulse: 75  Weight: 172 lb 2 oz (78.1 kg)  Height: '5\' 3"'$  (1.6 m)   Body mass index is 30.49 kg/m. Wt Readings from Last 3 Encounters:  05/26/22 172 lb 2 oz (78.1 kg)  04/30/22 175 lb (79.4 kg)  04/09/22 184 lb 12.8 oz (83.8 kg)   Patient is in no distress; well developed, nourished and groomed; neck is supple  CARDIOVASCULAR: Examination of carotid arteries is normal; no carotid bruits Regular rate and rhythm, no murmurs Examination of peripheral vascular system by observation and palpation is normal  EYES: Ophthalmoscopic exam of optic discs and posterior segments is normal; no papilledema or hemorrhages No results found.  MUSCULOSKELETAL: Gait, strength, tone, movements noted in Neurologic exam below  NEUROLOGIC: MENTAL STATUS:      No data to display         awake, alert, oriented to person, place and time recent and remote memory intact normal  attention and concentration language fluent, comprehension intact, naming intact fund of knowledge appropriate  CRANIAL NERVE:   2nd - no papilledema on fundoscopic exam 2nd, 3rd, 4th, 6th - pupils equal and reactive to light, visual fields full to confrontation, extraocular muscles intact, no nystagmus 5th - facial sensation symmetric 7th - facial strength symmetric 8th - hearing intact 9th - palate elevates symmetrically, uvula midline 11th - shoulder shrug symmetric 12th - tongue protrusion midline  MOTOR:  normal bulk and tone, full strength in the BUE, BLE; EXCEPT LIMITED IN LEFT DELTOID DUE TO PAIN  SENSORY:  normal and symmetric to light touch, temperature, vibration  COORDINATION:  finger-nose-finger, fine finger movements normal  REFLEXES:  deep tendon reflexes TRACE and symmetric  GAIT/STATION:  narrow based gait; USING WALKER     DIAGNOSTIC DATA (LABS, IMAGING, TESTING) - I reviewed patient records, labs, notes, testing and imaging myself where available.  Lab Results  Component Value Date   WBC 5.5 03/24/2022   HGB 12.4 03/24/2022   HCT 38.5 03/24/2022   MCV 102.9 (H) 03/24/2022   PLT 249 03/24/2022      Component Value Date/Time   NA 139 04/13/2022 1332   NA 143 10/08/2017 1042   NA 141 06/16/2017 1014   K 3.9 04/13/2022 1332   K 3.4 10/08/2017 1042   K 3.8 06/16/2017 1014   CL 99 04/13/2022 1332   CL 100 10/08/2017 1042   CO2 25 04/13/2022 1332   CO2 30 10/08/2017 1042   CO2 31 (H) 06/16/2017 1014   GLUCOSE 104 (H) 04/13/2022 1332   GLUCOSE 102 (H) 03/31/2022 0537   GLUCOSE 126 (H) 10/08/2017 1042   BUN 14 04/13/2022 1332   BUN 10 10/08/2017 1042   BUN 8.9 06/16/2017 1014   CREATININE 0.68 04/13/2022 1332   CREATININE 0.88 03/19/2022 1112   CREATININE 0.9 10/08/2017 1042   CREATININE 0.8 06/16/2017 1014   CALCIUM 9.9 04/13/2022 1332   CALCIUM 9.5 10/08/2017 1042   CALCIUM 10.1 06/16/2017 1014   PROT 6.5 03/24/2022 1419   PROT 6.7 10/08/2017 1042   PROT 7.1 06/16/2017 1014   ALBUMIN 3.4 (L) 03/24/2022 1419   ALBUMIN 3.3 10/08/2017 1042   ALBUMIN 3.5 06/16/2017  1014   AST 24 03/24/2022 1419   AST 17 03/19/2022 1112   AST 21 06/16/2017 1014   ALT 17 03/24/2022 1419   ALT 15 03/19/2022 1112   ALT 20 10/08/2017 1042   ALT 20 06/16/2017 1014   ALKPHOS 62 03/24/2022 1419   ALKPHOS 68 10/08/2017 1042   ALKPHOS 64 06/16/2017 1014   BILITOT 0.6 03/24/2022 1419   BILITOT 0.5 03/19/2022 1112   BILITOT 0.37 06/16/2017 1014   GFRNONAA >60 03/31/2022 0537   GFRNONAA >60 03/19/2022 1112   GFRAA >60 03/14/2020 1148   Lab Results  Component Value Date   CHOL 297 (H) 03/21/2022   HDL 49 03/21/2022   LDLCALC 216 (H) 03/21/2022   TRIG 161 (H) 03/21/2022   CHOLHDL 6.1 03/21/2022   Lab Results  Component Value Date   HGBA1C 5.6 03/21/2022   Lab Results  Component Value Date   VITAMINB12 604 11/28/2020   Lab Results  Component Value Date   TSH 3.986 03/20/2022    03/21/22 MRI brain  1. Small focus of acute ischemia within the left caudate body. No hemorrhage or mass effect. 2. Old right basal ganglia small vessel infarct and findings of chronic microvascular disease.  03/21/22 CTA head 1. No emergent large vessel  occlusion or high-grade stenosis of the intracranial or cervical arteries. 2. Aortic Atherosclerosis (ICD10-I70.0).   ASSESSMENT AND PLAN  81 y.o. year old female here with:   Dx:  1. Basal ganglia stroke (HCC)     PLAN:  LEFT basal ganglia stroke (likely small vessel thrombosis; hyperlipidemia and HTN) - complete DAPT (aspirin + plavix x 3 months; then aspirin '81mg'$  daily alone) - continue atorvastatin '80mg'$  daily - continue BP control - history of sleep apnea; intolerant of CPAP in past  Return for return to PCP.    Penni Bombard, MD 4/86/2824, 1:75 PM Certified in Neurology, Neurophysiology and Neuroimaging  [facility] [address] [place], [place] [address] [phone]

## 2022-05-27 DIAGNOSIS — I69351 Hemiplegia and hemiparesis following cerebral infarction affecting right dominant side: Secondary | ICD-10-CM | POA: Diagnosis not present

## 2022-05-27 DIAGNOSIS — I69320 Aphasia following cerebral infarction: Secondary | ICD-10-CM | POA: Diagnosis not present

## 2022-05-27 DIAGNOSIS — G8929 Other chronic pain: Secondary | ICD-10-CM | POA: Diagnosis not present

## 2022-05-27 DIAGNOSIS — K5900 Constipation, unspecified: Secondary | ICD-10-CM | POA: Diagnosis not present

## 2022-05-27 DIAGNOSIS — Z7901 Long term (current) use of anticoagulants: Secondary | ICD-10-CM | POA: Diagnosis not present

## 2022-05-27 DIAGNOSIS — D508 Other iron deficiency anemias: Secondary | ICD-10-CM | POA: Diagnosis not present

## 2022-05-27 DIAGNOSIS — R195 Other fecal abnormalities: Secondary | ICD-10-CM | POA: Diagnosis not present

## 2022-05-27 DIAGNOSIS — I69392 Facial weakness following cerebral infarction: Secondary | ICD-10-CM | POA: Diagnosis not present

## 2022-05-27 DIAGNOSIS — I69328 Other speech and language deficits following cerebral infarction: Secondary | ICD-10-CM | POA: Diagnosis not present

## 2022-05-27 DIAGNOSIS — Z8673 Personal history of transient ischemic attack (TIA), and cerebral infarction without residual deficits: Secondary | ICD-10-CM | POA: Diagnosis not present

## 2022-05-27 DIAGNOSIS — I129 Hypertensive chronic kidney disease with stage 1 through stage 4 chronic kidney disease, or unspecified chronic kidney disease: Secondary | ICD-10-CM | POA: Diagnosis not present

## 2022-05-27 NOTE — Telephone Encounter (Signed)
K+ stable at 3.9, continue current treatment, called to discuss, no further questions, continue to follow with you PCP

## 2022-05-28 ENCOUNTER — Inpatient Hospital Stay (HOSPITAL_COMMUNITY)
Admission: EM | Admit: 2022-05-28 | Discharge: 2022-06-05 | DRG: 378 | Disposition: A | Discharge status: Home Health | Payer: Medicare Other | Attending: Internal Medicine | Admitting: Internal Medicine

## 2022-05-28 ENCOUNTER — Inpatient Hospital Stay: Payer: Medicare Other

## 2022-05-28 ENCOUNTER — Encounter (HOSPITAL_COMMUNITY): Payer: Self-pay

## 2022-05-28 ENCOUNTER — Other Ambulatory Visit: Payer: Self-pay

## 2022-05-28 ENCOUNTER — Telehealth: Payer: Self-pay | Admitting: *Deleted

## 2022-05-28 ENCOUNTER — Telehealth: Payer: Self-pay | Admitting: Internal Medicine

## 2022-05-28 ENCOUNTER — Telehealth: Payer: Self-pay

## 2022-05-28 ENCOUNTER — Encounter: Payer: Self-pay | Admitting: Hematology & Oncology

## 2022-05-28 ENCOUNTER — Encounter: Payer: Self-pay | Admitting: Diagnostic Neuroimaging

## 2022-05-28 ENCOUNTER — Inpatient Hospital Stay: Payer: Medicare Other | Admitting: Hematology & Oncology

## 2022-05-28 VITALS — BP 146/62 | HR 73 | Temp 97.9°F | Resp 18 | Ht 63.0 in | Wt 172.1 lb

## 2022-05-28 DIAGNOSIS — K922 Gastrointestinal hemorrhage, unspecified: Secondary | ICD-10-CM | POA: Insufficient documentation

## 2022-05-28 DIAGNOSIS — A4901 Methicillin susceptible Staphylococcus aureus infection, unspecified site: Secondary | ICD-10-CM | POA: Diagnosis not present

## 2022-05-28 DIAGNOSIS — K635 Polyp of colon: Secondary | ICD-10-CM | POA: Diagnosis not present

## 2022-05-28 DIAGNOSIS — D509 Iron deficiency anemia, unspecified: Secondary | ICD-10-CM | POA: Diagnosis not present

## 2022-05-28 DIAGNOSIS — L03112 Cellulitis of left axilla: Secondary | ICD-10-CM | POA: Insufficient documentation

## 2022-05-28 DIAGNOSIS — N61 Mastitis without abscess: Secondary | ICD-10-CM | POA: Insufficient documentation

## 2022-05-28 DIAGNOSIS — K5909 Other constipation: Secondary | ICD-10-CM | POA: Diagnosis not present

## 2022-05-28 DIAGNOSIS — G4730 Sleep apnea, unspecified: Secondary | ICD-10-CM | POA: Diagnosis not present

## 2022-05-28 DIAGNOSIS — Z6831 Body mass index (BMI) 31.0-31.9, adult: Secondary | ICD-10-CM

## 2022-05-28 DIAGNOSIS — E876 Hypokalemia: Secondary | ICD-10-CM | POA: Diagnosis not present

## 2022-05-28 DIAGNOSIS — F32A Depression, unspecified: Secondary | ICD-10-CM | POA: Diagnosis not present

## 2022-05-28 DIAGNOSIS — Z170 Estrogen receptor positive status [ER+]: Secondary | ICD-10-CM

## 2022-05-28 DIAGNOSIS — R195 Other fecal abnormalities: Secondary | ICD-10-CM | POA: Diagnosis present

## 2022-05-28 DIAGNOSIS — R5383 Other fatigue: Secondary | ICD-10-CM | POA: Insufficient documentation

## 2022-05-28 DIAGNOSIS — K222 Esophageal obstruction: Secondary | ICD-10-CM | POA: Diagnosis not present

## 2022-05-28 DIAGNOSIS — Z95828 Presence of other vascular implants and grafts: Secondary | ICD-10-CM

## 2022-05-28 DIAGNOSIS — Z8673 Personal history of transient ischemic attack (TIA), and cerebral infarction without residual deficits: Secondary | ICD-10-CM

## 2022-05-28 DIAGNOSIS — E785 Hyperlipidemia, unspecified: Secondary | ICD-10-CM | POA: Diagnosis present

## 2022-05-28 DIAGNOSIS — Z515 Encounter for palliative care: Secondary | ICD-10-CM | POA: Diagnosis not present

## 2022-05-28 DIAGNOSIS — F419 Anxiety disorder, unspecified: Secondary | ICD-10-CM | POA: Diagnosis present

## 2022-05-28 DIAGNOSIS — D649 Anemia, unspecified: Secondary | ICD-10-CM | POA: Diagnosis not present

## 2022-05-28 DIAGNOSIS — D122 Benign neoplasm of ascending colon: Secondary | ICD-10-CM | POA: Diagnosis not present

## 2022-05-28 DIAGNOSIS — K2101 Gastro-esophageal reflux disease with esophagitis, with bleeding: Secondary | ICD-10-CM | POA: Diagnosis not present

## 2022-05-28 DIAGNOSIS — I69351 Hemiplegia and hemiparesis following cerebral infarction affecting right dominant side: Secondary | ICD-10-CM

## 2022-05-28 DIAGNOSIS — I69320 Aphasia following cerebral infarction: Secondary | ICD-10-CM | POA: Diagnosis not present

## 2022-05-28 DIAGNOSIS — Z853 Personal history of malignant neoplasm of breast: Secondary | ICD-10-CM | POA: Insufficient documentation

## 2022-05-28 DIAGNOSIS — B9561 Methicillin susceptible Staphylococcus aureus infection as the cause of diseases classified elsewhere: Secondary | ICD-10-CM | POA: Diagnosis not present

## 2022-05-28 DIAGNOSIS — Z90710 Acquired absence of both cervix and uterus: Secondary | ICD-10-CM

## 2022-05-28 DIAGNOSIS — Z66 Do not resuscitate: Secondary | ICD-10-CM | POA: Diagnosis present

## 2022-05-28 DIAGNOSIS — Z9221 Personal history of antineoplastic chemotherapy: Secondary | ICD-10-CM

## 2022-05-28 DIAGNOSIS — Z803 Family history of malignant neoplasm of breast: Secondary | ICD-10-CM

## 2022-05-28 DIAGNOSIS — I10 Essential (primary) hypertension: Secondary | ICD-10-CM | POA: Diagnosis not present

## 2022-05-28 DIAGNOSIS — C50212 Malignant neoplasm of upper-inner quadrant of left female breast: Secondary | ICD-10-CM

## 2022-05-28 DIAGNOSIS — D62 Acute posthemorrhagic anemia: Secondary | ICD-10-CM | POA: Diagnosis present

## 2022-05-28 DIAGNOSIS — R1310 Dysphagia, unspecified: Secondary | ICD-10-CM | POA: Diagnosis not present

## 2022-05-28 DIAGNOSIS — N390 Urinary tract infection, site not specified: Secondary | ICD-10-CM | POA: Diagnosis not present

## 2022-05-28 DIAGNOSIS — K314 Gastric diverticulum: Secondary | ICD-10-CM | POA: Diagnosis present

## 2022-05-28 DIAGNOSIS — D500 Iron deficiency anemia secondary to blood loss (chronic): Secondary | ICD-10-CM | POA: Diagnosis not present

## 2022-05-28 DIAGNOSIS — Z888 Allergy status to other drugs, medicaments and biological substances status: Secondary | ICD-10-CM

## 2022-05-28 DIAGNOSIS — Z7902 Long term (current) use of antithrombotics/antiplatelets: Secondary | ICD-10-CM | POA: Insufficient documentation

## 2022-05-28 DIAGNOSIS — Z9841 Cataract extraction status, right eye: Secondary | ICD-10-CM

## 2022-05-28 DIAGNOSIS — Z87442 Personal history of urinary calculi: Secondary | ICD-10-CM

## 2022-05-28 DIAGNOSIS — Z880 Allergy status to penicillin: Secondary | ICD-10-CM

## 2022-05-28 DIAGNOSIS — Z8249 Family history of ischemic heart disease and other diseases of the circulatory system: Secondary | ICD-10-CM

## 2022-05-28 DIAGNOSIS — K625 Hemorrhage of anus and rectum: Secondary | ICD-10-CM

## 2022-05-28 DIAGNOSIS — E669 Obesity, unspecified: Secondary | ICD-10-CM | POA: Diagnosis present

## 2022-05-28 DIAGNOSIS — B9620 Unspecified Escherichia coli [E. coli] as the cause of diseases classified elsewhere: Secondary | ICD-10-CM | POA: Diagnosis present

## 2022-05-28 DIAGNOSIS — Z881 Allergy status to other antibiotic agents status: Secondary | ICD-10-CM

## 2022-05-28 DIAGNOSIS — C50912 Malignant neoplasm of unspecified site of left female breast: Secondary | ICD-10-CM | POA: Diagnosis not present

## 2022-05-28 DIAGNOSIS — K219 Gastro-esophageal reflux disease without esophagitis: Secondary | ICD-10-CM | POA: Diagnosis present

## 2022-05-28 DIAGNOSIS — E038 Other specified hypothyroidism: Secondary | ICD-10-CM

## 2022-05-28 DIAGNOSIS — Z885 Allergy status to narcotic agent status: Secondary | ICD-10-CM

## 2022-05-28 DIAGNOSIS — K449 Diaphragmatic hernia without obstruction or gangrene: Secondary | ICD-10-CM | POA: Diagnosis not present

## 2022-05-28 DIAGNOSIS — Z79890 Hormone replacement therapy (postmenopausal): Secondary | ICD-10-CM

## 2022-05-28 DIAGNOSIS — Z882 Allergy status to sulfonamides status: Secondary | ICD-10-CM

## 2022-05-28 DIAGNOSIS — Z171 Estrogen receptor negative status [ER-]: Secondary | ICD-10-CM | POA: Diagnosis not present

## 2022-05-28 DIAGNOSIS — F418 Other specified anxiety disorders: Secondary | ICD-10-CM | POA: Diagnosis present

## 2022-05-28 DIAGNOSIS — E039 Hypothyroidism, unspecified: Secondary | ICD-10-CM | POA: Diagnosis present

## 2022-05-28 DIAGNOSIS — I6381 Other cerebral infarction due to occlusion or stenosis of small artery: Secondary | ICD-10-CM | POA: Diagnosis present

## 2022-05-28 DIAGNOSIS — Z79899 Other long term (current) drug therapy: Secondary | ICD-10-CM

## 2022-05-28 DIAGNOSIS — Z91199 Patient's noncompliance with other medical treatment and regimen due to unspecified reason: Secondary | ICD-10-CM

## 2022-05-28 DIAGNOSIS — Z7982 Long term (current) use of aspirin: Secondary | ICD-10-CM

## 2022-05-28 DIAGNOSIS — Z9842 Cataract extraction status, left eye: Secondary | ICD-10-CM

## 2022-05-28 DIAGNOSIS — Z923 Personal history of irradiation: Secondary | ICD-10-CM

## 2022-05-28 HISTORY — DX: Cerebral infarction, unspecified: I63.9

## 2022-05-28 LAB — CBC WITH DIFFERENTIAL (CANCER CENTER ONLY)
Abs Immature Granulocytes: 0.03 10*3/uL (ref 0.00–0.07)
Basophils Absolute: 0 10*3/uL (ref 0.0–0.1)
Basophils Relative: 1 %
Eosinophils Absolute: 0.4 10*3/uL (ref 0.0–0.5)
Eosinophils Relative: 6 %
HCT: 23.8 % — ABNORMAL LOW (ref 36.0–46.0)
Hemoglobin: 7.3 g/dL — ABNORMAL LOW (ref 12.0–15.0)
Immature Granulocytes: 1 %
Lymphocytes Relative: 20 %
Lymphs Abs: 1.3 10*3/uL (ref 0.7–4.0)
MCH: 28.9 pg (ref 26.0–34.0)
MCHC: 30.7 g/dL (ref 30.0–36.0)
MCV: 94.1 fL (ref 80.0–100.0)
Monocytes Absolute: 0.6 10*3/uL (ref 0.1–1.0)
Monocytes Relative: 9 %
Neutro Abs: 4.2 10*3/uL (ref 1.7–7.7)
Neutrophils Relative %: 63 %
Platelet Count: 364 10*3/uL (ref 150–400)
RBC: 2.53 MIL/uL — ABNORMAL LOW (ref 3.87–5.11)
RDW: 14.3 % (ref 11.5–15.5)
WBC Count: 6.6 10*3/uL (ref 4.0–10.5)
nRBC: 0 % (ref 0.0–0.2)

## 2022-05-28 LAB — COMPREHENSIVE METABOLIC PANEL
ALT: 18 U/L (ref 0–44)
AST: 17 U/L (ref 15–41)
Albumin: 3.4 g/dL — ABNORMAL LOW (ref 3.5–5.0)
Alkaline Phosphatase: 68 U/L (ref 38–126)
Anion gap: 7 (ref 5–15)
BUN: 17 mg/dL (ref 8–23)
CO2: 30 mmol/L (ref 22–32)
Calcium: 9.2 mg/dL (ref 8.9–10.3)
Chloride: 105 mmol/L (ref 98–111)
Creatinine, Ser: 0.6 mg/dL (ref 0.44–1.00)
GFR, Estimated: >60 mL/min (ref >60)
Glucose, Bld: 109 mg/dL — ABNORMAL HIGH (ref 70–99)
Potassium: 3.2 mmol/L — ABNORMAL LOW (ref 3.5–5.1)
Sodium: 142 mmol/L (ref 135–145)
Total Bilirubin: 0.4 mg/dL (ref 0.3–1.2)
Total Protein: 6.7 g/dL (ref 6.5–8.1)

## 2022-05-28 LAB — CMP (CANCER CENTER ONLY)
ALT: 16 U/L (ref 0–44)
AST: 16 U/L (ref 15–41)
Albumin: 4.3 g/dL (ref 3.5–5.0)
Alkaline Phosphatase: 79 U/L (ref 38–126)
Anion gap: 7 (ref 5–15)
BUN: 17 mg/dL (ref 8–23)
CO2: 33 mmol/L — ABNORMAL HIGH (ref 22–32)
Calcium: 9.4 mg/dL (ref 8.9–10.3)
Chloride: 100 mmol/L (ref 98–111)
Creatinine: 0.67 mg/dL (ref 0.44–1.00)
GFR, Estimated: >60 mL/min (ref >60)
Glucose, Bld: 100 mg/dL — ABNORMAL HIGH (ref 70–99)
Potassium: 3.5 mmol/L (ref 3.5–5.1)
Sodium: 140 mmol/L (ref 135–145)
Total Bilirubin: 0.4 mg/dL (ref 0.3–1.2)
Total Protein: 6.7 g/dL (ref 6.5–8.1)

## 2022-05-28 LAB — CBC WITH DIFFERENTIAL/PLATELET
Abs Immature Granulocytes: 0.02 10*3/uL (ref 0.00–0.07)
Basophils Absolute: 0 10*3/uL (ref 0.0–0.1)
Basophils Relative: 1 %
Eosinophils Absolute: 0.5 10*3/uL (ref 0.0–0.5)
Eosinophils Relative: 8 %
HCT: 22.8 % — ABNORMAL LOW (ref 36.0–46.0)
Hemoglobin: 7.1 g/dL — ABNORMAL LOW (ref 12.0–15.0)
Immature Granulocytes: 0 %
Lymphocytes Relative: 20 %
Lymphs Abs: 1.2 10*3/uL (ref 0.7–4.0)
MCH: 29.2 pg (ref 26.0–34.0)
MCHC: 31.1 g/dL (ref 30.0–36.0)
MCV: 93.8 fL (ref 80.0–100.0)
Monocytes Absolute: 0.6 10*3/uL (ref 0.1–1.0)
Monocytes Relative: 10 %
Neutro Abs: 3.8 10*3/uL (ref 1.7–7.7)
Neutrophils Relative %: 61 %
Platelets: 336 10*3/uL (ref 150–400)
RBC: 2.43 MIL/uL — ABNORMAL LOW (ref 3.87–5.11)
RDW: 14.4 % (ref 11.5–15.5)
WBC: 6.1 10*3/uL (ref 4.0–10.5)
nRBC: 0 % (ref 0.0–0.2)

## 2022-05-28 LAB — PROTIME-INR
INR: 1.1 (ref 0.8–1.2)
Prothrombin Time: 14 seconds (ref 11.4–15.2)

## 2022-05-28 LAB — FERRITIN: Ferritin: 7 ng/mL — ABNORMAL LOW (ref 11–307)

## 2022-05-28 LAB — POC OCCULT BLOOD, ED: Fecal Occult Bld: POSITIVE — AB

## 2022-05-28 LAB — SAMPLE TO BLOOD BANK

## 2022-05-28 LAB — PREPARE RBC (CROSSMATCH)

## 2022-05-28 MED ORDER — SODIUM CHLORIDE 0.9% FLUSH
10.0000 mL | Freq: Two times a day (BID) | INTRAVENOUS | Status: DC
  Administered 2022-05-29 – 2022-06-05 (×6): 10 mL

## 2022-05-28 MED ORDER — ACETAMINOPHEN 325 MG PO TABS
650.0000 mg | ORAL_TABLET | Freq: Four times a day (QID) | ORAL | Status: DC | PRN
  Administered 2022-05-28 – 2022-06-04 (×5): 650 mg via ORAL
  Filled 2022-05-28 (×5): qty 2

## 2022-05-28 MED ORDER — POTASSIUM CHLORIDE 20 MEQ PO PACK
40.0000 meq | PACK | Freq: Once | ORAL | Status: AC
  Administered 2022-05-28: 40 meq via ORAL
  Filled 2022-05-28: qty 2

## 2022-05-28 MED ORDER — ACETAMINOPHEN 650 MG RE SUPP: 650.0000 mg | Freq: Four times a day (QID) | RECTAL | Status: DC | PRN

## 2022-05-28 MED ORDER — SODIUM CHLORIDE 0.9 % IV SOLN
10.0000 mL/h | Freq: Once | INTRAVENOUS | Status: AC
  Administered 2022-05-28: 10 mL/h via INTRAVENOUS

## 2022-05-28 MED ORDER — CHLORHEXIDINE GLUCONATE CLOTH 2 % EX PADS
6.0000 | MEDICATED_PAD | Freq: Every day | CUTANEOUS | Status: DC
Start: 2022-05-29 — End: 2022-06-05
  Administered 2022-05-29 – 2022-06-03 (×6): 6 via TOPICAL

## 2022-05-28 MED ORDER — PANTOPRAZOLE SODIUM 40 MG IV SOLR
40.0000 mg | Freq: Two times a day (BID) | INTRAVENOUS | Status: DC
  Administered 2022-05-29 – 2022-06-05 (×14): 40 mg via INTRAVENOUS
  Filled 2022-05-28 (×14): qty 10

## 2022-05-28 MED ORDER — PANTOPRAZOLE SODIUM 40 MG IV SOLR
40.0000 mg | Freq: Once | INTRAVENOUS | Status: AC
Start: 2022-05-28 — End: 2022-05-28
  Administered 2022-05-28: 40 mg via INTRAVENOUS
  Filled 2022-05-28: qty 10

## 2022-05-28 MED ORDER — SODIUM CHLORIDE 0.9% FLUSH
10.0000 mL | INTRAVENOUS | Status: DC | PRN
  Administered 2022-06-03: 10 mL

## 2022-05-28 NOTE — H&P (Signed)
History and Physical    PLEASE NOTE THAT DRAGON DI[employer] SOFTWARE WAS USED IN THE CONSTRUCTION OF THIS NOTE.   Jessica [name] KZS:[id number] DOB: 06-09-1941 DOA: 05/28/2022  PCP: [name], MD  Patient coming from: home   I have personally briefly reviewed patient's old medical records in [place]  Chief Complaint: Low hemoglobin on outpatient labs  HPI: Jessica [name] is a 81 y.o. female with medical history significant for left breast cancer status postradiation/chemotherapy, now reportedly in remission, ischemic CVA in May 2023, hypertension, hyperlipidemia, GERD, who is admitted to [facility] on 05/28/2022 with suspected acute lower gastrointestinal bleed complicated by acute blood loss anemia after presenting from home to [facility] ED complaining of low hemoglobin per outpatient labs.   The following history is provided via my discussions with the patient as well as my discussions with the patient's daughter, is present bedside, in addition to my discussions with the EDP, and via chart review  In the setting of a history of left breast cancer diagnosed in 2018, status post radiation/chemotherapy, now reportedly in remission, the patient was following up on routine basis with her outpatient oncologist earlier today, when routine outpatient labs ordered by her oncologist revealed hemoglobin of 7.3, relative to baseline hemoglobin range of 12-13 as well as most recent prior hemoglobin value of 12.4 on 03/24/2022.  Consequently, oncologist recommended that the patient presented to the emergency department for further evaluation and management thereof.  The patient denies any recent abdominal discomfort, nausea, vomiting, hematemesis, melena, hematochezia, or diarrhea.  No recent trauma.  She also denies any recent chest pain, shortness of breath, palpitations, dizziness, or diaphoresis. Denies any associated subjective fever, chills, or rigors.  Conveys that most recent  colonoscopy occurred greater than a year ago, was performed via [facility] gastroenterology.   Denies any consumption of alcohol or known liver disease, and conveys a history of GERD for which she is on omeprazole at home.  In the setting of a history of ischemic CVA in May 2023 with residual expressive aphasia and right hemiparesis, the patient remains on dual antiplatelet therapy via daily baby aspirin and Plavix 75 mg p.o. daily through 06/24/2022.  Otherwise, denies any additional blood thinners or formal anticoagulation as an outpatient.  Aside from this residual right-sided hemiparesis, patient denies any new acute focal weakness, but rather reports generalized weakness over the last few weeks.  Denies use of NSAIDs.  She is on scheduled oral potassium chloride as an outpatient.     ED Course:  Vital signs in the ED were notable for the following: Afebrile; heart rate 73-22; systolic blood pressures in the 120s to 140s mmHg; respiratory rate 16-20, oxygen saturation 94 to 97% on room air.    Labs were notable for the following:  CMP notable for the following: Potassium 3.2, BUN 17 compared to most recent prior value of 14 on 04/13/2022, creatinine 0.60, liver enzymes within normal limits.  CBC notable for hemoglobin 7.1 associated normocytic/normochromic findings as well as nonelevated RDW and platelet count 336.  INR 1.1.  Type and screen completed.  DRE performed by EDP revealed brown-colored stool that was fecal occult blood positive.  In setting of the patient's request for [facility] gastroenterology given that she is previously established with our practice, EDP discussed patient's case with the on-call [place] gastroenterologist, Dr. Candis Schatz, Who recommended admission to the hospitalist service, with plan for Dr. Candis Schatz to formally consult and see the patient in the morning, at which time  she anticipates EGD.  Additionally, she requests that the patient be made n.p.o. at midnight and that  her aspirin/Plavix be held in the interval.  While in the ED, the following were administered: Protonix 40 mg IV x1, and transfusion of 1 unit PRBC was initiated.  Subsequently, the patient was admitted for observation to PCU at [facility] for further evaluation management of suspected acute lower gastrointestinal bleed, complicated by acute blood loss anemia, with presenting labs also notable for hypokalemia.     Review of Systems: As per HPI otherwise 10 point review of systems negative.   Past Medical History:  Diagnosis Date   Allergy    Anemia    Anxiety    Asthma    in past, no inhalers now   Blood transfusion without reported diagnosis    Breast cancer ([place]) 01/2017   left breast    Breast cancer of upper-inner quadrant of left female breast ([place]) 03/11/2017   Cancer ([place]) 01/2017   left breast   Cataract    bilateral removed   Chronic kidney disease    kidney stones   Depression 08/31/2013   Dyslipidemia, goal LDL below 160 08/31/2013   Essential hypertension - well controlled 08/31/2013   Family history of breast cancer    Family history of melanoma    Family history of prostate cancer    GERD (gastroesophageal reflux disease)    Goals of care, counseling/discussion 03/11/2017   History of radiation therapy 10/13/17-11/11/17   left breast 40.05 Gy in 15 fractions, left breast boost 10 Gy in 5 fractions   Hypothyroidism    Obesity (BMI 30-39.9) 08/31/2013   Personal history of chemotherapy 2018   Personal history of radiation therapy 2019   Thyroid disease    Vasovagal near-syncope -rare 08/31/2013    Past Surgical History:  Procedure Laterality Date   ABDOMINAL HYSTERECTOMY     total   BREAST BIOPSY Left 02/24/2017    malignant   BREAST LUMPECTOMY Left 03/23/2017   broken fingers     CATARACT EXTRACTION Bilateral    COLONOSCOPY     EXCISIONAL HEMORRHOIDECTOMY     IR FLUORO GUIDE PORT INSERTION RIGHT  08/04/2017   IR REMOVAL TUN ACCESS W/ PORT W/O FL MOD  SED  08/04/2017   IR US GUIDE VASC ACCESS RIGHT  08/04/2017   IRRIGATION AND DEBRIDEMENT ABSCESS Left 05/18/2017   Procedure: IRRIGATION AND DEBRIDEMENT LEFT AXILLARY ABSCESS;  Surgeon: Michael Boston, MD;  Location: [facility];  Service: General;  Laterality: Left;   kidney stones lithotripsy     PORTACATH PLACEMENT Right 03/23/2017   Procedure: INSERTION PORT-A-CATH;  Surgeon: Erroll Luna, MD;  Location: [place];  Service: General;  Laterality: Right;   RADIOACTIVE SEED GUIDED PARTIAL MASTECTOMY WITH AXILLARY SENTINEL LYMPH NODE BIOPSY Left 03/23/2017   Procedure: LEFT BREAST RADIOACTIVE SEED GUIDED PARTIAL MASTECTOMY AND  SENTINEL LYMPH NODE [place];  Surgeon: Erroll Luna, MD;  Location: [place];  Service: General;  Laterality: Left;   ROTATOR CUFF REPAIR     left    TONSILLECTOMY     WISDOM TOOTH EXTRACTION      Social History:  reports that she has never smoked. She has been exposed to tobacco smoke. She has never used smokeless tobacco. She reports that she does not drink alcohol and does not use drugs.   Allergies  Allergen Reactions   Advil [Ibuprofen] Hives and Itching   Codeine Hives   Penicillins Hives  Has patient had a PCN reaction causing immediate rash, facial/tongue/throat swelling, SOB or lightheadedness with hypotension: yes Has patient had a PCN reaction causing severe rash involving mucus membranes or skin necrosis:  no Has patient had a PCN reaction that required hospitalization:  no Has patient had a PCN reaction occurring within the last 10 years: no If all of the above answers are "NO", then may proceed with Cephalosporin use. Has patient had a PCN reaction causing immediate rash, facial/tongue/throat swelling, SOB or lightheadedness with hypotension: yes Has patient had a PCN reaction causing severe rash involving mucus membranes or skin necrosis:  no Has patient had a PCN reaction that required hospitalization:  no Has  patient had a PCN reaction occurring within the last 10 years: no If all of the above answers are "NO", then may proceed with Cephalosporin use.    Sulfamethoxazole-Trimethoprim Hives   Band-Aid Plus Antibiotic [Bacitracin-Polymyxin B] Dermatitis   Benazepril Swelling    Possible cause of tongue swelling   Other Rash    Band-aid    Family History  Problem Relation Age of Onset   Heart disease Mother        Angina   Heart disease Father    Cervical cancer Maternal Aunt    Heart attack Brother        Died of MI in his mid 29s   Prostate cancer Brother        dx in his early 82s   Breast cancer Paternal Aunt        dx over 58   Bone cancer Cousin        maternal first cousin dx in her 75s-60s   Lung cancer Cousin        paternal first cousin   Colon cancer Neg Hx    Pancreatic cancer Neg Hx    Stomach cancer Neg Hx    Esophageal cancer Neg Hx    Rectal cancer Neg Hx     Family history reviewed and not pertinent    Prior to Admission medications   Medication Sig Start Date End Date Taking? Authorizing Provider  acetaminophen (TYLENOL) 325 MG tablet Take 2 tablets (650 mg total) by mouth every 6 (six) hours as needed for mild pain (or Fever >/= 101). 03/30/22  Yes Angiulli, Lavon Paganini, PA-C  amLODipine (NORVASC) 10 MG tablet Take 1 tablet (10 mg total) by mouth daily. 03/30/22  Yes Angiulli, Lavon Paganini, PA-C  aspirin EC 81 MG tablet Take 81 mg by mouth daily. Swallow whole.   Yes [provider]  atorvastatin (LIPITOR) 80 MG tablet Take 1 tablet (80 mg total) by mouth at bedtime. Patient taking differently: Take 80 mg by mouth daily. 03/30/22  Yes Angiulli, Lavon Paganini, PA-C  buPROPion (WELLBUTRIN XL) 150 MG 24 hr tablet Take 1 tablet (150 mg total) by mouth every morning. Patient taking differently: Take 150 mg by mouth daily. 03/30/22  Yes Angiulli, Lavon Paganini, PA-C  cholecalciferol (VITAMIN D3) 25 MCG (1000 UNIT) tablet Take 1 tablet (1,000 Units total) by mouth daily. 03/30/22   Yes Angiulli, Lavon Paganini, PA-C  clopidogrel (PLAVIX) 75 MG tablet 1 tablet daily through 06/24/2022 and stop Patient taking differently: Take 75 mg by mouth daily. 03/30/22  Yes Angiulli, Lavon Paganini, PA-C  diclofenac Sodium (VOLTAREN) 1 % GEL Apply 2 g topically 4 (four) times daily as needed (shoulder pain). 03/30/22  Yes Angiulli, Lavon Paganini, PA-C  gabapentin (NEURONTIN) 100 MG capsule Take 100 mg by mouth at bedtime.  Yes [provider]  hydrochlorothiazide (MICROZIDE) 12.5 MG capsule Take 1 capsule (12.5 mg total) by mouth daily. 03/30/22  Yes Angiulli, Lavon Paganini, PA-C  levothyroxine (SYNTHROID) 75 MCG tablet Take 1 tablet (75 mcg total) by mouth daily. 03/30/22  Yes Angiulli, Lavon Paganini, PA-C  Multiple Vitamins-Minerals (OCUVITE ADULT FORMULA PO) Take 1 tablet by mouth daily.   Yes [provider]  omeprazole (PRILOSEC) 40 MG capsule Take 40 mg by mouth daily.   Yes [provider]  polyethylene glycol (MIRALAX / GLYCOLAX) 17 g packet Take 17 g by mouth daily. Patient taking differently: Take 17 g by mouth daily as needed for mild constipation. 03/24/22  Yes Danford, Suann Larry, MD  potassium chloride (KLOR-CON M) 10 MEQ tablet Take 1 tablet (10 mEq total) by mouth 3 (three) times daily. 03/30/22  Yes Kirsteins, Luanna Salk, MD  venlafaxine XR (EFFEXOR-XR) 75 MG 24 hr capsule Take 3 capsules (225 mg total) by mouth daily. 03/30/22  Yes Angiulli, Lavon Paganini, PA-C  aspirin 81 MG chewable tablet Chew 1 tablet (81 mg total) by mouth daily. Patient not taking: Reported on 05/28/2022 03/24/22   Edwin Dada, MD  magnesium gluconate (MAGONATE) 500 MG tablet Take 1 tablet (500 mg total) by mouth at bedtime. Patient not taking: Reported on 05/26/2022 03/30/22   Angiulli, Lavon Paganini, PA-C  modafinil (PROVIGIL) 100 MG tablet Take 1 tablet (100 mg total) by mouth daily. Patient not taking: Reported on 05/26/2022 03/30/22   Angiulli, Lavon Paganini, PA-C  nitrofurantoin, macrocrystal-monohydrate,  (MACROBID) 100 MG capsule Take 100 mg by mouth every 12 (twelve) hours. Patient not taking: Reported on 05/26/2022 04/27/22   [provider]  pantoprazole (PROTONIX) 40 MG tablet Take 1 tablet (40 mg total) by mouth daily. Patient not taking: Reported on 05/26/2022 03/30/22   Cathlyn Parsons, PA-C     Objective    Physical Exam: Vitals:   05/28/22 1900 05/28/22 1930 05/28/22 1945 05/28/22 2000  BP: 131/69 126/61 129/74 122/69  Pulse: 65 66 70 67  Resp: '18 18 18 18  '$ Temp:  97.9 F (36.6 C)    TempSrc:  Oral    SpO2: 94% 95% 96% 95%    General: appears to be stated age; alert, oriented Skin: warm, dry, no rash Head:  AT/[place] Mouth:  Oral mucosa membranes appear moist, normal dentition Neck: supple; trachea midline Heart:  RRR; did not appreciate any M/R/G Lungs: CTAB, did not appreciate any wheezes, rales, or rhonchi Abdomen: + BS; soft, ND, NT Vascular: 2+ pedal pulses b/l; 2+ radial pulses b/l Extremities: no peripheral edema, no muscle wasting Neuro: sensation intact in upper and lower extremities b/l; 4/5 strength in right upper and lower extremities; 5/5 strength in LUE/LLE.     Labs on Admission: I have personally reviewed following labs and imaging studies  CBC: Recent Labs  Lab 05/28/22 1330 05/28/22 1728  WBC 6.6 6.1  NEUTROABS 4.2 3.8  HGB 7.3* 7.1*  HCT 23.8* 22.8*  MCV 94.1 93.8  PLT 364 161   Basic Metabolic Panel: Recent Labs  Lab 05/28/22 1330 05/28/22 1728  NA 140 142  K 3.5 3.2*  CL 100 105  CO2 33* 30  GLUCOSE 100* 109*  BUN 17 17  CREATININE 0.67 0.60  CALCIUM 9.4 9.2   GFR: Estimated Creatinine Clearance: 55.5 mL/min (by C-G formula based on SCr of 0.6 mg/dL). Liver Function Tests: Recent Labs  Lab 05/28/22 1330 05/28/22 1728  AST 16 17  ALT 16 18  ALKPHOS 79 68  BILITOT 0.4 0.4  PROT 6.7 6.7  ALBUMIN 4.3 3.4*   No results for input(s): "LIPASE", "AMYLASE" in the last 168 hours. No results for input(s): "AMMONIA"  in the last 168 hours. Coagulation Profile: Recent Labs  Lab 05/28/22 1728  INR 1.1   Cardiac Enzymes: No results for input(s): "CKTOTAL", "CKMB", "CKMBINDEX", "TROPONINI" in the last 168 hours. BNP (last 3 results) No results for input(s): "PROBNP" in the last 8760 hours. HbA1C: No results for input(s): "HGBA1C" in the last 72 hours. CBG: No results for input(s): "GLUCAP" in the last 168 hours. Lipid Profile: No results for input(s): "CHOL", "HDL", "LDLCALC", "TRIG", "CHOLHDL", "LDLDIRECT" in the last 72 hours. Thyroid Function Tests: No results for input(s): "TSH", "T4TOTAL", "FREET4", "T3FREE", "THYROIDAB" in the last 72 hours. Anemia Panel: Recent Labs    05/28/22 1330  FERRITIN 7*   Urine analysis:    Component Value Date/Time   COLORURINE YELLOW 03/20/2022 2101   APPEARANCEUR CLEAR 03/20/2022 2101   LABSPEC 1.009 03/20/2022 2101   PHURINE 6.0 03/20/2022 2101   GLUCOSEU NEGATIVE 03/20/2022 2101   HGBUR MODERATE (A) 03/20/2022 2101   [place] NEGATIVE 03/20/2022 2101   [place] NEGATIVE 03/20/2022 2101   PROTEINUR NEGATIVE 03/20/2022 2101   NITRITE NEGATIVE 03/20/2022 2101   LEUKOCYTESUR TRACE (A) 03/20/2022 2101    Radiological Exams on Admission: No results found.    Assessment/Plan    Principal Problem:   Acute lower GI bleeding Active Problems:   Essential hypertension - well controlled   Dyslipidemia, goal LDL below 160   GERD (gastroesophageal reflux disease)   Acquired hypothyroidism   Depression with anxiety   Hypokalemia   Acute blood loss anemia       #) Acute lower GI bleed: diagnosis on the basis of presenting evidence of acute blood loss anemia, with DRE revealing Hemoccult positive stool.  Not entirely clear at this time the patient's acute gastrointestinal bleed is upper or lower and source.  There is a very small incremental increase in the patient's BUN relative to most recent prior from approximately 6 weeks ago.  However, if  the underlying sources upper gastrointestinal in nature, would expect this to be very slow bleed given the patient's relative the symptomatic nature as well as hemodynamic stability given interval decline in hemoglobin values as quantified.   On daily baby aspirin as well as Plavix in the setting of recent ischemic CVA, with plan for final dose of Plavix to occur on 06/24/2022.  She is also at slightly increased risk for acute upper gastrointestinal bleed via outpatient oral potassium supplementation.   No reported history of alcohol abuse. No known history of liver disease to warrant SBP prophylaxis. INR 1.1.   Patient appears hemodynamically stable with normotensive/non-tachycardic vital signs thus far, and is currently asymptomatic aside from mild generalized weakness.  Given the absence of overt evidence for acute upper gastrointestinal source for bleed at this time, we will refrain from initiation of Protonix drip, will noting that she received Protonix 40 mg IV x1.  We will continue IV Protonix on a twice daily basis.  Is currently receiving transfusion of 1 unit PRBC.  EDP consulted on-call [facility] gastroenterologist, Dr. Candis Schatz, Who will formally consult and see the patient in the morning, requesting n.p.o. at midnight, with plan for EGD in the morning, as well as request to hold interval aspirin/Plavix.     Plan: Clear liquid diet leading up to NPO at midnight. Refraining from pharmacologic DVT prophylaxis. Monitor  on telemetry. Monitor continuous pulse-ox. Maintain at least 2 large bore IV's.  Continue transfusion of 1 unit PRBC initiated in the ED this evening.  Posttransfusion H&H ordered for midnight on 7/28, with subsequent acute 4-hour H&H trending ordered through 9 AM on 05/21/2022.  Lower gastroenterology consulted, as above.  Holding home daily baby aspirin and Plavix, as above.         #) Acute blood loss anemia: in the setting of suspected acute GI bleed w/ currently unclear  lower vs upper source, as above, presenting Hgb noted to be 7.1, which is relative to baseline range of 12-13 as well as most recent prior hemoglobin value of 12.4 on 03/24/2022. At this time, patient appears hemodynamically stable and asymptomatic aside from mild generalized weakness, as further described above.  Given her relative the asymptomatic nature and appearance of hemodynamic stability, a potential subacute timeframe for her gastrointestinal bleed is possible, given that most recent prior hemoglobin data point occurred slightly greater than 2 months ago at the time of initiation of course of dual antiplatelet therapy in setting of ischemic CVA at that time.  Of note, no specific cardiac indication for continuation overnight of dual antiplatelet therapy, including no recent stent placement. INR 1.1.    Plan: work-up and management for presenting suspected acute GI bleed, as above, including completion of transfusion of 1 unit PRBC, with ensuing close monitoring of Q4H H&H's, with clinical evaluation for determination of need for blood transfusion, as further described above. Monitor on telemetry. Monitor continuous pulse-ox. NPO at midnight. Refraining from pharmacologic DVT prophylaxis.  Hold home daily aspirin/Plavix, as above. Add on the following to initial lab specimen collected in the ED today: total iron, TIBC, ferritin, reticulocyte count. GI consulted, as above.          #) Hypokalemia: Presenting serum potassium level 3.2.  Appears to have a history of such, noting that she is on daily oral potassium supplementation at home.   Plan:-Potassium chloride 40 mill equivalents PO via powder x 1 dose now.  Add on serum magnesium level.  Monitor on symmetry.  Repeat CMP in the morning.         #) Depression: Documented history of such, on Effexor as well as Wellbutrin as an outpatient.  Plan: In the setting of n.p.o. status, will hold home Effexor/Wellbutrin for  now.           #) Hyperlipidemia: documented h/o such. On high intensity atorvastatin as outpatient.    Plan: In the setting of current n.p.o. status,  will hold home statin for now.              #) Essential Hypertension: documented h/o such, with outpatient antihypertensive regimen including HCTZ, amlodipine.  SBP's in the ED today: Normotensive.  However in the setting of acute gastrointestinal bleed complicated by symptomatic acute blood loss anemia, will hold home hypertensive medications for now.  Plan: Close monitoring of subsequent BP via routine VS. hold home amlodipine/HCTZ for now.            #) GERD: documented h/o such; on omeprazole as outpatient.   Plan: In setting of presenting acute gastrointestinal bleed, will hold home omeprazole for now and pursue Protonix 40 mg IV twice daily.           #) acquired hypothyroidism: documented h/o such, on Synthroid as outpatient.   Plan: will hold home Synthroid in setting of current n.p.o. status.  DVT prophylaxis: SCD's   Code Status: DNR/DNI (per my discussions this evening with the patient as well as her daughter) Family Communication: I discussed the patient's case with her daughter, who is present at bedside Disposition Plan: Per Rounding Team Consults called: EDP discussed the patient's case with the on-call [facility] gastroenterologist, Dr. Candis Schatz, As further detailed above;  Admission status: Observation   PLEASE NOTE THAT DRAGON DICTATION SOFTWARE WAS USED IN THE CONSTRUCTION OF THIS NOTE.   [place] DO [facility] [place]   05/28/2022, 8:30 PM

## 2022-05-28 NOTE — ED Notes (Signed)
Patient request lab draw from port. 

## 2022-05-28 NOTE — Telephone Encounter (Signed)
[name] called: Jessica [name] PCP has taken her off the Plavix because the hemoglobin level was at 8.

## 2022-05-28 NOTE — Telephone Encounter (Signed)
Per 05/28/22 los -  Keep her appointment that was scheduled when we saw her back in May.  She has been admitted to the hospital today for GI bleeding.

## 2022-05-28 NOTE — Telephone Encounter (Signed)
done

## 2022-05-28 NOTE — ED Provider Notes (Signed)
[facility] DEPT Provider Note   CSN: [id number] Arrival date & time: 05/28/22  1512     History  Chief Complaint  Patient presents with   Abnormal Lab   Rectal Bleeding    Jessica [name] is a 81 y.o. female.  Patient with history of hypertension, breast cancer in remission, and CVA in 05/23 presents today from her oncologist with concerns for GI bleeding and low hemoglobin. States that her hemoglobin at her oncologist was 7.3 down from 12 around 2 months ago. Had a hemmocult performed today that was positive as well. Denies any obvious hematochezia or melena. States that she has been feeling more lightheaded, fatigued, and weak recently. She is newly on Plavix and aspirin after CVA in May 2023. She has mild right sided deficits from same as well as speech deficits. Currently is living with her daughter and ambulates with a walker at baseline.  Daughter at bedside states she does have a gastroenterologist Dr. Fuller Plan from [place], but has not seen him since her last colonoscopy in 2015 when she had several polyps. Denies any abdominal pain, nausea, vomiting, or diarrhea. Does state that she has been somewhat constipated over the past 2-3 days, however states that she always struggles with constipation at baseline. Denies any history of GI bleeds, states that she has required iron transfusions in the past for iron deficiency anemia, but never any blood transfusions.  The history is provided by the patient. No language interpreter was used.  Abnormal Lab Rectal Bleeding      Home Medications Prior to Admission medications   Medication Sig Start Date End Date Taking? Authorizing Provider  acetaminophen (TYLENOL) 325 MG tablet Take 2 tablets (650 mg total) by mouth every 6 (six) hours as needed for mild pain (or Fever >/= 101). 03/30/22   Angiulli, Lavon Paganini, PA-C  amLODipine (NORVASC) 10 MG tablet Take 1 tablet (10 mg total) by mouth daily. 03/30/22   Angiulli,  Lavon Paganini, PA-C  aspirin 81 MG chewable tablet Chew 1 tablet (81 mg total) by mouth daily. 03/24/22   Danford, Suann Larry, MD  atorvastatin (LIPITOR) 80 MG tablet Take 1 tablet (80 mg total) by mouth at bedtime. 03/30/22   Angiulli, Lavon Paganini, PA-C  buPROPion (WELLBUTRIN XL) 150 MG 24 hr tablet Take 1 tablet (150 mg total) by mouth every morning. 03/30/22   Angiulli, Lavon Paganini, PA-C  cholecalciferol (VITAMIN D3) 25 MCG (1000 UNIT) tablet Take 1 tablet (1,000 Units total) by mouth daily. 03/30/22   Angiulli, Lavon Paganini, PA-C  clopidogrel (PLAVIX) 75 MG tablet 1 tablet daily through 06/24/2022 and stop 03/30/22   Angiulli, Lavon Paganini, PA-C  diclofenac Sodium (VOLTAREN) 1 % GEL Apply 2 g topically 4 (four) times daily as needed (shoulder pain). 03/30/22   Angiulli, Lavon Paganini, PA-C  hydrochlorothiazide (MICROZIDE) 12.5 MG capsule Take 1 capsule (12.5 mg total) by mouth daily. 03/30/22   Angiulli, Lavon Paganini, PA-C  levothyroxine (SYNTHROID) 75 MCG tablet Take 1 tablet (75 mcg total) by mouth daily. 03/30/22   Angiulli, Lavon Paganini, PA-C  magnesium gluconate (MAGONATE) 500 MG tablet Take 1 tablet (500 mg total) by mouth at bedtime. Patient not taking: Reported on 05/26/2022 03/30/22   Angiulli, Lavon Paganini, PA-C  modafinil (PROVIGIL) 100 MG tablet Take 1 tablet (100 mg total) by mouth daily. Patient not taking: Reported on 05/26/2022 03/30/22   Cathlyn Parsons, PA-C  Multiple Vitamin (MULTIVITAMIN) tablet Take 1 tablet by mouth daily. Patient not taking: Reported  on 05/26/2022    [provider]  Multiple Vitamins-Minerals (OCUVITE ADULT FORMULA PO) Take by mouth daily.    [provider]  nitrofurantoin, macrocrystal-monohydrate, (MACROBID) 100 MG capsule Take 100 mg by mouth every 12 (twelve) hours. Patient not taking: Reported on 05/26/2022 04/27/22   [provider]  OMEPRAZOLE PO Take by mouth.    [provider]  pantoprazole (PROTONIX) 40 MG tablet Take 1 tablet (40 mg total) by mouth  daily. Patient not taking: Reported on 05/26/2022 03/30/22   Angiulli, Lavon Paganini, PA-C  polyethylene glycol (MIRALAX / GLYCOLAX) 17 g packet Take 17 g by mouth daily. Patient not taking: Reported on 05/26/2022 03/24/22   Edwin Dada, MD  potassium chloride (KLOR-CON M) 10 MEQ tablet Take 1 tablet (10 mEq total) by mouth 3 (three) times daily. 03/30/22   Kirsteins, Luanna Salk, MD  venlafaxine XR (EFFEXOR-XR) 75 MG 24 hr capsule Take 3 capsules (225 mg total) by mouth daily. 03/30/22   Angiulli, Lavon Paganini, PA-C      Allergies    Advil [ibuprofen], Codeine, Penicillins, Sulfamethoxazole-trimethoprim, Band-aid plus antibiotic [bacitracin-polymyxin b], Benazepril, and Other    Review of Systems   Review of Systems  Gastrointestinal:  Positive for blood in stool and hematochezia.  All other systems reviewed and are negative.   Physical Exam Updated Vital Signs BP 140/76   Pulse 73   Temp 98.4 F (36.9 C) (Oral)   Resp 17   SpO2 97%  Physical Exam Vitals and nursing note reviewed. Exam conducted with a chaperone present.  Constitutional:      General: She is not in acute distress.    Appearance: Normal appearance. She is normal weight. She is not ill-appearing, toxic-appearing or diaphoretic.     Comments: Pale appearing  HENT:     Head: Normocephalic and atraumatic.  Eyes:     Pupils: Pupils are equal, round, and reactive to light.  Cardiovascular:     Rate and Rhythm: Normal rate and regular rhythm.     Heart sounds: Normal heart sounds.  Pulmonary:     Effort: Pulmonary effort is normal. No respiratory distress.     Breath sounds: Normal breath sounds.  Chest:     Comments: Port in place over the right chest wall non-infectious appearing Abdominal:     General: Abdomen is flat.     Palpations: Abdomen is soft.  Genitourinary:    Comments: Light brown stool present in the rectal vault without any obvious signs of blood Musculoskeletal:        General: Normal range of  motion.     Cervical back: Normal range of motion.  Skin:    General: Skin is warm and dry.  Neurological:     General: No focal deficit present.     Mental Status: She is alert.  Psychiatric:        Mood and Affect: Mood normal.        Behavior: Behavior normal.     ED Results / Procedures / Treatments   Labs (all labs ordered are listed, but only abnormal results are displayed) Labs Reviewed  COMPREHENSIVE METABOLIC PANEL - Abnormal; Notable for the following components:      Result Value   Potassium 3.2 (*)    Glucose, Bld 109 (*)    Albumin 3.4 (*)    All other components within normal limits  CBC WITH DIFFERENTIAL/PLATELET - Abnormal; Notable for the following components:   RBC 2.43 (*)  Hemoglobin 7.1 (*)    HCT 22.8 (*)    All other components within normal limits  POC OCCULT BLOOD, ED - Abnormal; Notable for the following components:   Fecal Occult Bld POSITIVE (*)    All other components within normal limits  PROTIME-INR  TYPE AND SCREEN  PREPARE RBC (CROSSMATCH)    EKG None  Radiology No results found.  Procedures .Critical Care  Performed by: Bud Face, PA-C Authorized by: Bud Face, PA-C   Critical care provider statement:    Critical care time (minutes):  35   Critical care start time:  05/28/2022 5:00 PM   Critical care end time:  05/28/2022 5:35 PM   Critical care time was exclusive of:  Separately billable procedures and treating other patients   Critical care was necessary to treat or prevent imminent or life-threatening deterioration of the following conditions:  Circulatory failure   Critical care was time spent personally by me on the following activities:  Development of treatment plan with patient or surrogate, discussions with consultants, discussions with primary provider, evaluation of patient's response to treatment, examination of patient, obtaining history from patient or surrogate, ordering and review of laboratory studies,  pulse oximetry and re-evaluation of patient's condition   I assumed direction of critical care for this patient from another provider in my specialty: no     Care discussed with: admitting provider       Medications Ordered in ED Medications  pantoprazole (PROTONIX) injection 40 mg (40 mg Intravenous Given 05/28/22 1836)  0.9 %  sodium chloride infusion (10 mL/hr Intravenous New Bag/Given 05/28/22 1838)    ED Course/ Medical Decision Making/ A&P                           Medical Decision Making Amount and/or Complexity of Data Reviewed Labs: ordered.  Risk Prescription drug management.   This patient presents to the ED for concern of GI bleeding and low hemoglobin, this involves an extensive number of treatment options, and is a complaint that carries with it a high risk of complications and morbidity.    Co morbidities that complicate the patient evaluation  Hx CVA, hypertension, breast cancer in remission   Additional history obtained:  Additional history obtained from epic chart review  Lab Tests:  I Ordered, and personally interpreted labs.  The pertinent results include:  Hgb 7.1 down from 12.4 2 months ago. K 3.2. Hemoccult positive. No other acute laboratory findings   Consultations Obtained:  I requested consultation with the gastroenterology on-call Dr. Candis Schatz,  and discussed lab and imaging findings as well as pertinent plan - they recommend: admit to medicine, NPO at midnight, plan for upper endoscopy tomorrow and potentially colonoscopy at some point but does not need prep at this time. Hold aspirin and plavix for now   Problem List / ED Course / Critical interventions / Medication management  I ordered medication including fluids and 1 unit RBCs and protonix  for symptomatic anemia and GI bleeding  Reevaluation of the patient after these medicines showed that the patient improved I have reviewed the patients home medicines and have made adjustments as  needed   Test / Admission - Considered:  Patient presents today with GI bleeding and symptomatic anemia. Hemoglobin 7.1 down from 12 2 months ago. Hemoccult positive. Given her symptoms of worsening fatigue and weakness will need admission for same. Given 1 unit of blood so far. Discussed patient  with GI who will come see her.   Discussed patient with hospitalist who agrees to admit   This is a shared visit with supervising physician Dr. Melina Copa who has independently evaluated patient & provided guidance in evaluation/management/disposition, in agreement with care    Final Clinical Impression(s) / ED Diagnoses Final diagnoses:  Symptomatic anemia  Rectal bleeding    Rx / DC Orders ED Discharge Orders     None         Nestor Lewandowsky 05/28/22 2017    Hayden Rasmussen, MD 05/29/22 1157

## 2022-05-28 NOTE — Telephone Encounter (Signed)
Call received from patient's daughter, [name] stating that pt.'s HGB from Dr. Elna Breslow office on 7/276/23 was 8.0 and that Dr. Celesta Aver would like for Dr. Marin Olp to assist in pt.'s care at this time.  Dr. Marin Olp notified.  Instructed [name] to bring pt in today for lab work and for pt to see Dr. Marin Olp per order of Dr. Marin Olp.  [name] states that she can have pt here by 1:00PM today.  Dr. Marin Olp notified and message sent to scheduling.

## 2022-05-28 NOTE — Patient Instructions (Signed)

## 2022-05-28 NOTE — ED Triage Notes (Signed)
Pt sent from cancer center due to hemoglobin that dropped from 8 to 7.3. Pt is not receiving chemo at this time. Oncologist did rectal exam earlier today and found blood coming from rectum. Denies abdominal pain.

## 2022-05-28 NOTE — ED Provider Triage Note (Signed)
Emergency Medicine Provider Triage Evaluation Note  Jessica [name] , a 81 y.o. female  was evaluated in triage.  Pt sent in from her oncologist for drop in her hemoglobin.  She has had a downtrending hemoglobin for about a month or so.  She had a hemoglobin of 8 this past Friday and then 7.3 today.  She is feeling lightheaded and weak.  They did Hemoccult at the oncologist office that was positive.  She is newly on aspirin and Plavix after CVA.  Daughter at bedside states she does have a gastroenterologist but she cannot remember who it is.  Review of Systems  Positive: Weakness Negative: Fever  Physical Exam  BP 137/64 (BP Location: Right Arm)   Pulse 74   Temp 98.4 F (36.9 C) (Oral)   Resp 16   SpO2 93%  Gen:   Awake, no distress   Resp:  Normal effort  MSK:   Moves extremities without difficulty  Other:  No abdominal tenderness  Medical Decision Making  Medically screening exam initiated at 3:50 PM.  Appropriate orders placed.  DEAN WONDER was informed that the remainder of the evaluation will be completed by another provider, this initial triage assessment does not replace that evaluation, and the importance of remaining in the ED until their evaluation is complete.  Work-up initiated   Margarita Mail, PA-C 05/28/22 1552

## 2022-05-28 NOTE — Telephone Encounter (Signed)
Dr. Marin Olp notified of HGB-7.3.  Pt to be admitted and sent down to the [facility] ER per order of Dr. Marin Olp.

## 2022-05-28 NOTE — Telephone Encounter (Signed)
Jessica [name] is calling to tell us that the patient had a stroke recently and he will not be able to do the sleep study and that we need to cancel the test right now.   Thank you

## 2022-05-28 NOTE — Progress Notes (Signed)
Hematology and Oncology Follow Up Visit  Jessica [name] [id number] 1941/09/16 80 y.o. 05/28/2022   Principle Diagnosis:  Stage IIA (T2bN0M0) invasive ductal ca of the LEFT breast - TRIPLE NEGATIVE Staphylococcus aureus wound infection of the LEFT axilla Cellulitis of the left breast  Past Therapy:   Taxotere/Carboplatin - s/p cycle #4 -completed adjuvant therapy on 09/09/2017 Radiation therapy to the right breast.-Completed 4005 rad with an additional 1000 rad boost on 11/11/2017  Current Therapy: Observation   Interim History:  Jessica [name] is here today for an unscheduled visit.  Apparently, she saw her family doctor yesterday.  She been not feeling all that well.  She been feeling very tired.  She has had a lot of fatigue.  Her family doctor was very diligent and went ahead and did some lab work on her.  Surprising, hemoglobin was 8.  We saw Jessica [name] back in May, her hemoglobin was 12.9.  Back in May, after we saw Jessica [name], she had a stroke.  She was put on aspirin and Plavix.  She has been making a slow but steady recovery.  In talking to her today, she has noted that her stool has been dark for the past week and a half.  She has had no abdominal pain.  There is been no nausea or vomiting.  She has had some shortness of breath with exertion.  She does get around with a rolling walker.  Her appetite has also been down.  But we saw her today, her hemoglobin was 7.3.  I did going do a rectal exam on her.  Her stool is dark brown and heme positive.  As such, going to have to have her admitted.  This is some that is totally unrelated to her breast cancer.  She really is on no therapy for her breast cancer.  She finished chemotherapy 4 and half years ago.  She will need to have a upper and lower endoscopy.  I would think that she may have some gastritis from the aspirin.  I am sure that her aspirin and Plavix will be stopped temporarily.  Overall, I would say performance  status is probably ECOG 2.    Medications:  Allergies as of 05/28/2022       Reactions   Advil [ibuprofen] Hives, Itching   Codeine Hives   Penicillins Hives   Has patient had a PCN reaction causing immediate rash, facial/tongue/throat swelling, SOB or lightheadedness with hypotension: yes Has patient had a PCN reaction causing severe rash involving mucus membranes or skin necrosis:  no Has patient had a PCN reaction that required hospitalization:  no Has patient had a PCN reaction occurring within the last 10 years: no If all of the above answers are "NO", then may proceed with Cephalosporin use. Has patient had a PCN reaction causing immediate rash, facial/tongue/throat swelling, SOB or lightheadedness with hypotension: yes Has patient had a PCN reaction causing severe rash involving mucus membranes or skin necrosis:  no Has patient had a PCN reaction that required hospitalization:  no Has patient had a PCN reaction occurring within the last 10 years: no If all of the above answers are "NO", then may proceed with Cephalosporin use.   Sulfamethoxazole-trimethoprim Hives   Band-aid Plus Antibiotic [bacitracin-polymyxin B] Dermatitis   Benazepril Swelling   Possible cause of tongue swelling   Other Rash   Band-aid        Medication List        Accurate as of May 28, 2022  3:03 PM. If you have any questions, ask your nurse or doctor.          acetaminophen 325 MG tablet Commonly known as: TYLENOL Take 2 tablets (650 mg total) by mouth every 6 (six) hours as needed for mild pain (or Fever >/= 101).   amLODipine 10 MG tablet Commonly known as: NORVASC Take 1 tablet (10 mg total) by mouth daily.   aspirin 81 MG chewable tablet Chew 1 tablet (81 mg total) by mouth daily.   atorvastatin 80 MG tablet Commonly known as: LIPITOR Take 1 tablet (80 mg total) by mouth at bedtime.   buPROPion 150 MG 24 hr tablet Commonly known as: WELLBUTRIN XL Take 1 tablet (150 mg total)  by mouth every morning.   cholecalciferol 25 MCG (1000 UNIT) tablet Commonly known as: VITAMIN D3 Take 1 tablet (1,000 Units total) by mouth daily.   clopidogrel 75 MG tablet Commonly known as: PLAVIX 1 tablet daily through 06/24/2022 and stop   diclofenac Sodium 1 % Gel Commonly known as: VOLTAREN Apply 2 g topically 4 (four) times daily as needed (shoulder pain).   hydrochlorothiazide 12.5 MG capsule Commonly known as: MICROZIDE Take 1 capsule (12.5 mg total) by mouth daily.   levothyroxine 75 MCG tablet Commonly known as: SYNTHROID Take 1 tablet (75 mcg total) by mouth daily.   magnesium gluconate 500 MG tablet Commonly known as: MAGONATE Take 1 tablet (500 mg total) by mouth at bedtime.   modafinil 100 MG tablet Commonly known as: PROVIGIL Take 1 tablet (100 mg total) by mouth daily.   multivitamin tablet Take 1 tablet by mouth daily.   nitrofurantoin (macrocrystal-monohydrate) 100 MG capsule Commonly known as: MACROBID Take 100 mg by mouth every 12 (twelve) hours.   OCUVITE ADULT FORMULA PO Take by mouth daily.   OMEPRAZOLE PO Take by mouth.   pantoprazole 40 MG tablet Commonly known as: PROTONIX Take 1 tablet (40 mg total) by mouth daily.   polyethylene glycol 17 g packet Commonly known as: MIRALAX / GLYCOLAX Take 17 g by mouth daily.   potassium chloride 10 MEQ tablet Commonly known as: KLOR-CON M Take 1 tablet (10 mEq total) by mouth 3 (three) times daily.   venlafaxine XR 75 MG 24 hr capsule Commonly known as: EFFEXOR-XR Take 3 capsules (225 mg total) by mouth daily.        Allergies:  Allergies  Allergen Reactions   Advil [Ibuprofen] Hives and Itching   Codeine Hives   Penicillins Hives    Has patient had a PCN reaction causing immediate rash, facial/tongue/throat swelling, SOB or lightheadedness with hypotension: yes Has patient had a PCN reaction causing severe rash involving mucus membranes or skin necrosis:  no Has patient had a PCN  reaction that required hospitalization:  no Has patient had a PCN reaction occurring within the last 10 years: no If all of the above answers are "NO", then may proceed with Cephalosporin use. Has patient had a PCN reaction causing immediate rash, facial/tongue/throat swelling, SOB or lightheadedness with hypotension: yes Has patient had a PCN reaction causing severe rash involving mucus membranes or skin necrosis:  no Has patient had a PCN reaction that required hospitalization:  no Has patient had a PCN reaction occurring within the last 10 years: no If all of the above answers are "NO", then may proceed with Cephalosporin use.    Sulfamethoxazole-Trimethoprim Hives   Band-Aid Plus Antibiotic [Bacitracin-Polymyxin B] Dermatitis   Benazepril Swelling    Possible cause  of tongue swelling   Other Rash    Band-aid    Past Medical History, Surgical history, Social history, and Family History were reviewed and updated.  Review of Systems: Review of Systems  Constitutional: Negative.   HENT: Negative.    Eyes: Negative.   Respiratory: Negative.    Cardiovascular: Negative.   Gastrointestinal: Negative.   Genitourinary: Negative.   Musculoskeletal: Negative.   Skin: Negative.   Neurological: Negative.   Endo/Heme/Allergies: Negative.   Psychiatric/Behavioral: Negative.       Physical Exam:  height is '5\' 3"'$  (1.6 m) and weight is 172 lb 1.9 oz (78.1 kg). Her oral temperature is 97.9 F (36.6 C). Her blood pressure is 146/62 (abnormal) and her pulse is 73. Her respiration is 18 and oxygen saturation is 99%.   Wt Readings from Last 3 Encounters:  05/28/22 172 lb 1.9 oz (78.1 kg)  05/26/22 172 lb 2 oz (78.1 kg)  04/30/22 175 lb (79.4 kg)  Vital signs were taken.  Temperature 98.4.  Pulse 96.  Blood pressure 125/70.  Weight is 175 pounds.  Physical Exam Vitals reviewed.  Constitutional:      Comments: Her breast exam shows right breast with no masses, edema or erythema.  There is  no right axillary adenopathy.  Left breast shows the lumpectomy that is well-healed.  She has some contraction of the left breast.  There are some firmness at the lumpectomy site secondary to prior infection that required secondary intention.  There is no distinct mass in the left breast.  There is no left axillary adenopathy.  There is some telangiectasias on the left breast from radiation.  HENT:     Head: Normocephalic and atraumatic.  Eyes:     Pupils: Pupils are equal, round, and reactive to light.  Cardiovascular:     Rate and Rhythm: Normal rate and regular rhythm.     Heart sounds: Normal heart sounds.  Pulmonary:     Effort: Pulmonary effort is normal.     Breath sounds: Normal breath sounds.  Abdominal:     General: Bowel sounds are normal.     Palpations: Abdomen is soft.  Genitourinary:    Comments: Rectal exam shows some small external hemorrhoids.  She had no mass in the rectal vault.  Her stool was dark brown and heme positive. Musculoskeletal:        General: No tenderness or deformity. Normal range of motion.     Cervical back: Normal range of motion.  Lymphadenopathy:     Cervical: No cervical adenopathy.  Skin:    General: Skin is warm and dry.     Findings: No erythema or rash.  Neurological:     Mental Status: She is alert and oriented to person, place, and time.  Psychiatric:        Behavior: Behavior normal.        Thought Content: Thought content normal.        Judgment: Judgment normal.      Lab Results  Component Value Date   WBC 6.6 05/28/2022   HGB 7.3 (L) 05/28/2022   HCT 23.8 (L) 05/28/2022   MCV 94.1 05/28/2022   PLT 364 05/28/2022   Lab Results  Component Value Date   FERRITIN 11 09/23/2021   IRON 56 09/23/2021   TIBC 413 09/23/2021   UIBC 357 09/23/2021   IRONPCTSAT 13 (L) 09/23/2021   Lab Results  Component Value Date   RBC 2.53 (L) 05/28/2022   No results found  for: "KPAFRELGTCHN", "LAMBDASER", "KAPLAMBRATIO" No results found  for: "IGGSERUM", "IGA", "IGMSERUM" No results found for: "TOTALPROTELP", "ALBUMINELP", "A1GS", "A2GS", "BETS", "BETA2SER", "GAMS", "MSPIKE", "SPEI"   Chemistry      Component Value Date/Time   NA 140 05/28/2022 1330   NA 139 04/13/2022 1332   NA 143 10/08/2017 1042   NA 141 06/16/2017 1014   K 3.5 05/28/2022 1330   K 3.4 10/08/2017 1042   K 3.8 06/16/2017 1014   CL 100 05/28/2022 1330   CL 100 10/08/2017 1042   CO2 33 (H) 05/28/2022 1330   CO2 30 10/08/2017 1042   CO2 31 (H) 06/16/2017 1014   BUN 17 05/28/2022 1330   BUN 14 04/13/2022 1332   BUN 10 10/08/2017 1042   BUN 8.9 06/16/2017 1014   CREATININE 0.67 05/28/2022 1330   CREATININE 0.9 10/08/2017 1042   CREATININE 0.8 06/16/2017 1014      Component Value Date/Time   CALCIUM 9.4 05/28/2022 1330   CALCIUM 9.5 10/08/2017 1042   CALCIUM 10.1 06/16/2017 1014   ALKPHOS 79 05/28/2022 1330   ALKPHOS 68 10/08/2017 1042   ALKPHOS 64 06/16/2017 1014   AST 16 05/28/2022 1330   AST 21 06/16/2017 1014   ALT 16 05/28/2022 1330   ALT 20 10/08/2017 1042   ALT 20 06/16/2017 1014   BILITOT 0.4 05/28/2022 1330   BILITOT 0.37 06/16/2017 1014      Impression and Plan: Ms. Shugrue is a very pleasant 81 yo caucasian female with stage IIA ductal carcinoma of the left breast.  Her breast cancer is TRIPLE NEGATIVE.    She had her partial mastectomy in May 2018 followed by chemotherapy and radiation.  She completed chemotherapy in November 2018.  She then had radiation and completed radiation in January 2019.  Again, her problem is clearly gastroenterology.  She does have a GI bleed.  She is on aspirin and Plavix.  I am sure that Neurology will have to be involved with respect to her treatment for the stroke.  Her blood counts otherwise are looking pretty good.  I am sure that she will need iron in the hospital.  I am sure she will be transfused.  She may also need some folic acid.  I am sure that we will see her in the  hospital.  Otherwise, she will keep her regular appointment with [place] unless we have to see her sooner after her hospitalization for the GI bleed.   [name], MD 7/27/20233:03 PM

## 2022-05-29 ENCOUNTER — Observation Stay (HOSPITAL_COMMUNITY): Payer: Medicare Other | Admitting: Certified Registered Nurse Anesthetist

## 2022-05-29 ENCOUNTER — Observation Stay (HOSPITAL_BASED_OUTPATIENT_CLINIC_OR_DEPARTMENT_OTHER): Payer: Medicare Other | Admitting: Certified Registered Nurse Anesthetist

## 2022-05-29 ENCOUNTER — Encounter (HOSPITAL_COMMUNITY): Payer: Self-pay | Admitting: Internal Medicine

## 2022-05-29 ENCOUNTER — Encounter (HOSPITAL_COMMUNITY)
Admission: EM | Disposition: A | Discharge status: Home / Self Care | Payer: Self-pay | Source: Home / Self Care | Attending: Internal Medicine

## 2022-05-29 DIAGNOSIS — D509 Iron deficiency anemia, unspecified: Secondary | ICD-10-CM | POA: Diagnosis not present

## 2022-05-29 DIAGNOSIS — Z171 Estrogen receptor negative status [ER-]: Secondary | ICD-10-CM | POA: Diagnosis not present

## 2022-05-29 DIAGNOSIS — K314 Gastric diverticulum: Secondary | ICD-10-CM

## 2022-05-29 DIAGNOSIS — A4901 Methicillin susceptible Staphylococcus aureus infection, unspecified site: Secondary | ICD-10-CM

## 2022-05-29 DIAGNOSIS — K449 Diaphragmatic hernia without obstruction or gangrene: Secondary | ICD-10-CM | POA: Diagnosis not present

## 2022-05-29 DIAGNOSIS — D62 Acute posthemorrhagic anemia: Secondary | ICD-10-CM | POA: Diagnosis not present

## 2022-05-29 DIAGNOSIS — R195 Other fecal abnormalities: Secondary | ICD-10-CM | POA: Diagnosis not present

## 2022-05-29 DIAGNOSIS — K922 Gastrointestinal hemorrhage, unspecified: Secondary | ICD-10-CM | POA: Diagnosis not present

## 2022-05-29 DIAGNOSIS — K222 Esophageal obstruction: Secondary | ICD-10-CM

## 2022-05-29 DIAGNOSIS — D500 Iron deficiency anemia secondary to blood loss (chronic): Secondary | ICD-10-CM | POA: Diagnosis present

## 2022-05-29 DIAGNOSIS — C50912 Malignant neoplasm of unspecified site of left female breast: Secondary | ICD-10-CM | POA: Diagnosis not present

## 2022-05-29 HISTORY — PX: ESOPHAGOGASTRODUODENOSCOPY (EGD) WITH PROPOFOL: SHX5813

## 2022-05-29 LAB — TYPE AND SCREEN
ABO/RH(D): A POS
Antibody Screen: NEGATIVE
Unit division: 0

## 2022-05-29 LAB — CBC WITH DIFFERENTIAL/PLATELET
Abs Immature Granulocytes: 0.02 10*3/uL (ref 0.00–0.07)
Basophils Absolute: 0 10*3/uL (ref 0.0–0.1)
Basophils Relative: 1 %
Eosinophils Absolute: 0.4 10*3/uL (ref 0.0–0.5)
Eosinophils Relative: 6 %
HCT: 26.3 % — ABNORMAL LOW (ref 36.0–46.0)
Hemoglobin: 8.3 g/dL — ABNORMAL LOW (ref 12.0–15.0)
Immature Granulocytes: 0 %
Lymphocytes Relative: 24 %
Lymphs Abs: 1.6 10*3/uL (ref 0.7–4.0)
MCH: 27.2 pg (ref 26.0–34.0)
MCHC: 31.6 g/dL (ref 30.0–36.0)
MCV: 86.2 fL (ref 80.0–100.0)
Monocytes Absolute: 0.7 10*3/uL (ref 0.1–1.0)
Monocytes Relative: 10 %
Neutro Abs: 3.9 10*3/uL (ref 1.7–7.7)
Neutrophils Relative %: 59 %
Platelets: 321 10*3/uL (ref 150–400)
RBC: 3.05 MIL/uL — ABNORMAL LOW (ref 3.87–5.11)
RDW: 20.9 % — ABNORMAL HIGH (ref 11.5–15.5)
WBC: 6.6 10*3/uL (ref 4.0–10.5)
nRBC: 0 % (ref 0.0–0.2)

## 2022-05-29 LAB — IRON AND IRON BINDING CAPACITY (CC-WL,HP ONLY)
Iron: 18 ug/dL — ABNORMAL LOW (ref 28–170)
Saturation Ratios: 4 % — ABNORMAL LOW (ref 10.4–31.8)
TIBC: 441 ug/dL (ref 250–450)
UIBC: 423 ug/dL (ref 148–442)

## 2022-05-29 LAB — COMPREHENSIVE METABOLIC PANEL
ALT: 18 U/L (ref 0–44)
AST: 18 U/L (ref 15–41)
Albumin: 3.6 g/dL (ref 3.5–5.0)
Alkaline Phosphatase: 67 U/L (ref 38–126)
Anion gap: 10 (ref 5–15)
BUN: 15 mg/dL (ref 8–23)
CO2: 28 mmol/L (ref 22–32)
Calcium: 8.8 mg/dL — ABNORMAL LOW (ref 8.9–10.3)
Chloride: 105 mmol/L (ref 98–111)
Creatinine, Ser: 0.54 mg/dL (ref 0.44–1.00)
GFR, Estimated: >60 mL/min (ref >60)
Glucose, Bld: 92 mg/dL (ref 70–99)
Potassium: 3.7 mmol/L (ref 3.5–5.1)
Sodium: 143 mmol/L (ref 135–145)
Total Bilirubin: 1 mg/dL (ref 0.3–1.2)
Total Protein: 6.4 g/dL — ABNORMAL LOW (ref 6.5–8.1)

## 2022-05-29 LAB — RETICULOCYTES
Immature Retic Fract: 33.4 % — ABNORMAL HIGH (ref 2.3–15.9)
RBC.: 3.03 MIL/uL — ABNORMAL LOW (ref 3.87–5.11)
Retic Count, Absolute: 63.9 10*3/uL (ref 19.0–186.0)
Retic Ct Pct: 2.1 % (ref 0.4–3.1)

## 2022-05-29 LAB — BPAM RBC
Blood Product Expiration Date: 202308142359
ISSUE DATE / TIME: 202307271922
Unit Type and Rh: 6200

## 2022-05-29 LAB — MAGNESIUM: Magnesium: 2 mg/dL (ref 1.7–2.4)

## 2022-05-29 LAB — HEMOGLOBIN AND HEMATOCRIT, BLOOD
HCT: 27.2 % — ABNORMAL LOW (ref 36.0–46.0)
Hemoglobin: 8.6 g/dL — ABNORMAL LOW (ref 12.0–15.0)

## 2022-05-29 SURGERY — ESOPHAGOGASTRODUODENOSCOPY (EGD) WITH PROPOFOL
Anesthesia: Monitor Anesthesia Care

## 2022-05-29 MED ORDER — SODIUM CHLORIDE 0.9 % IV SOLN
510.0000 mg | Freq: Once | INTRAVENOUS | Status: AC
  Administered 2022-05-29: 510 mg via INTRAVENOUS
  Filled 2022-05-29: qty 17

## 2022-05-29 MED ORDER — BISACODYL 5 MG PO TBEC: 10.0000 mg | DELAYED_RELEASE_TABLET | Freq: Every day | ORAL | Status: DC | PRN

## 2022-05-29 MED ORDER — POLYETHYLENE GLYCOL 3350 17 GM/SCOOP PO POWD
0.5000 | Freq: Once | ORAL | Status: AC
  Administered 2022-05-31: 127.5 g via ORAL
  Filled 2022-05-29: qty 255

## 2022-05-29 MED ORDER — LACTATED RINGERS IV SOLN
INTRAVENOUS | Status: AC | PRN
  Administered 2022-05-29: 1000 mL via INTRAVENOUS

## 2022-05-29 MED ORDER — LIDOCAINE 2% (20 MG/ML) 5 ML SYRINGE
INTRAMUSCULAR | Status: DC | PRN
  Administered 2022-05-29: 80 mg via INTRAVENOUS

## 2022-05-29 MED ORDER — FOLIC ACID 1 MG PO TABS
2.0000 mg | ORAL_TABLET | Freq: Every day | ORAL | Status: DC
  Administered 2022-05-30 – 2022-06-05 (×6): 2 mg via ORAL
  Filled 2022-05-29 (×6): qty 2

## 2022-05-29 MED ORDER — SODIUM CHLORIDE 0.9 % IV SOLN: INTRAVENOUS | Status: DC

## 2022-05-29 MED ORDER — PROPOFOL 500 MG/50ML IV EMUL
INTRAVENOUS | Status: DC | PRN
  Administered 2022-05-29: 150 ug/kg/min via INTRAVENOUS

## 2022-05-29 SURGICAL SUPPLY — 15 items

## 2022-05-29 NOTE — TOC Initial Note (Signed)
Transition of Care [facility]) - Initial/Assessment Note    Patient Details  Name: Jessica [name] MRN: [id number] Date of Birth: 10/10/1941  Transition of Care [facility]) CM/SW Contact:    Leeroy Cha, RN Phone Number: 05/29/2022, 7:51 AM  Clinical Narrative:                  Transition of Care [facility]) Screening Note   Patient Details  Name: Jessica [name] Date of Birth: 1941/10/03   Transition of Care [facility]) CM/SW Contact:    Leeroy Cha, RN Phone Number: 05/29/2022, 7:51 AM    Transition of Care Department [facility]) has reviewed patient and no TOC needs have been identified at this time. We will continue to monitor patient advancement through interdisciplinary progression rounds. If new patient transition needs arise, please place a TOC consult.    Expected Discharge Plan: Home/Self Care Barriers to Discharge: Continued Medical Work up   Patient Goals and CMS Choice Patient states their goals for this hospitalization and ongoing recovery are:: to go back to my home CMS Medicare.gov Compare Post Acute Care list provided to:: Patient Choice offered to / list presented to : Patient  Expected Discharge Plan and Services Expected Discharge Plan: Home/Self Care   Discharge Planning Services: CM Consult   Living arrangements for the past 2 months: Single Family Home                                      Prior Living Arrangements/Services Living arrangements for the past 2 months: Single Family Home Lives with:: Spouse Patient language and need for interpreter reviewed:: Yes Do you feel safe going back to the place where you live?: Yes            Criminal Activity/Legal Involvement Pertinent to Current Situation/Hospitalization: No - Comment as needed  Activities of Daily Living Home Assistive Devices/Equipment: Environmental consultant (specify type), Shower chair with back, Grab bars around toilet, Hand-held shower hose, Eyeglasses, Wheelchair (reading glasses) ADL  Screening (condition at time of admission) Patient's cognitive ability adequate to safely complete daily activities?: No Is the patient deaf or have difficulty hearing?: No Does the patient have difficulty seeing, even when wearing glasses/contacts?: No Does the patient have difficulty concentrating, remembering, or making decisions?: Yes Patient able to express need for assistance with ADLs?: Yes (has trouble talking) Does the patient have difficulty dressing or bathing?: Yes Independently performs ADLs?: No Communication: Independent (has trouble talking) Dressing (OT): Needs assistance Is this a change from baseline?: Pre-admission baseline Grooming: Independent Feeding: Independent (has to be watched when she eats so she does not get choked) Bathing: Needs assistance Is this a change from baseline?: Pre-admission baseline Toileting: Needs assistance Is this a change from baseline?: Pre-admission baseline In/Out Bed: Needs assistance Is this a change from baseline?: Pre-admission baseline Walks in Home: Needs assistance Is this a change from baseline?: Pre-admission baseline Does the patient have difficulty walking or climbing stairs?: Yes Weakness of Legs: None Weakness of Arms/Hands: None (pt states left shoulder hurts a lot)  Permission Sought/Granted                  Emotional Assessment Appearance:: Appears stated age Attitude/Demeanor/Rapport: Engaged Affect (typically observed): Calm Orientation: : Oriented to Place, Oriented to Self, Oriented to  Time, Oriented to Situation Alcohol / Substance Use: Never Used Psych Involvement: No (comment)  Admission diagnosis:  Rectal bleeding [K62.[place]  Acute lower GI bleeding [K92.2] Symptomatic anemia [D64.9] Patient Active Problem List   Diagnosis Date Noted   Acute lower GI bleeding 05/28/2022   Acute blood loss anemia 05/28/2022   Left thalamic infarction ([place]) 03/23/2022   Hypokalemia 03/21/2022   Acute CVA  (cerebrovascular accident) ([place]) 03/20/2022   Acquired hammer toe of right foot 08/19/2021   Adverse reaction to ACE-I (angiotensin-converting enzyme inhibitor) 08/19/2021   Allergic rhinitis 08/19/2021   Anxiety 08/19/2021   Asthma 08/19/2021   Cardiomegaly 08/19/2021   Dependence on other enabling machines and devices 08/19/2021   History of adenomatous polyp of colon 08/19/2021   History of pneumonia 08/19/2021   Internal hemorrhoid 08/19/2021   Iron deficiency anemia secondary to inadequate dietary iron intake 08/19/2021   Osteopenia 08/19/2021   Personal history of malignant neoplasm of breast 08/19/2021   Somnolence 08/19/2021   Vitamin D deficiency 08/19/2021   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   Insomnia 06/25/2019   Obstructive sleep apnea 06/13/2019   Genetic testing 06/29/2017   Family history of melanoma    Family history of prostate cancer    Family history of breast cancer    Angioedema 05/26/2017   Left axillary seroma status post incision and drainage 05/18/2017 05/18/2017   Cellulitis of left axilla 05/17/2017   GERD (gastroesophageal reflux disease) 05/16/2017   Acquired hypothyroidism 05/16/2017   Depression with anxiety 05/16/2017   Breast cancer of upper-inner quadrant of left female breast ([place]) 03/11/2017   Goals of care, counseling/discussion 03/11/2017   Obesity (BMI 30-39.9) 08/31/2013   Essential hypertension - well controlled 08/31/2013    Class: Diagnosis of   Depression 08/31/2013    Class: Diagnosis of   Vasovagal near-syncope -rare 08/31/2013    Class: History of   Dyslipidemia, goal LDL below 160 08/31/2013   PCP:  [name], MD Pharmacy:   [id number]: [phone]: [phone]    Social Determinants of Health (SDOH) Interventions    Readmission Risk Interventions     No data to display

## 2022-05-29 NOTE — Hospital Course (Addendum)
63 F with past medical history of left breast invasive ductal carcinoma triple negative, history of staph wound  infection of the left axilla/cellulitis of the left breast followed by Dr. Marin Olp S/P cycle #4 completed adjuvant therapy 09/09/2017, history of stroke back in May with residual expressive aphasia and right hemiparesisplaced on aspirin and Plavix, HTN, HLD, GERD having dark stool past week and a half and blood work showed anemia, Hemoccult positive GI was consulted 1 unit PRBC transfused and patient was admitted for further management . 7/28-underwent EGD-Schatzki ring, hiatal hernia gastric diverticulum without bleeding-advised colonoscopy on Tuesday after Plavix washout, continue clear liquid diet until Monday

## 2022-05-29 NOTE — Anesthesia Preprocedure Evaluation (Addendum)
Anesthesia Evaluation  Patient identified by MRN, date of birth, ID band Patient awake    Reviewed: Allergy & Precautions, NPO status , Patient's Chart, lab work & pertinent test results  Airway Mallampati: II  TM Distance: >3 FB Neck ROM: Full    Dental no notable dental hx.    Pulmonary asthma , sleep apnea ,    Pulmonary exam normal breath sounds clear to auscultation       Cardiovascular hypertension, Pt. on medications negative cardio ROS Normal cardiovascular exam Rhythm:Regular Rate:Normal     Neuro/Psych PSYCHIATRIC DISORDERS Anxiety Depression CVA (03/2022- on plavix and ASA, being held d/t GIB) negative neurological ROS  negative psych ROS   GI/Hepatic negative GI ROS, Neg liver ROS, GERD  Controlled and Medicated,Baseline hgb mid 11- 12, now 7.3 on plavix / asa since May. At least one episode of rectal bleeding in June per daughter . Also stools dark over last couple of months  Pt refusing cscope, but has hx of complex polyps on cscope in 2015- has not followed up since then   Endo/Other  negative endocrine ROSHypothyroidism   Renal/GU negative Renal ROS  negative genitourinary   Musculoskeletal negative musculoskeletal ROS (+)   Abdominal (+) + obese,   Peds negative pediatric ROS (+)  Hematology negative hematology ROS (+) Blood dyscrasia, anemia , Hb this AM 8.6, plt 321   Anesthesia Other Findings Hx L breast ca   Reproductive/Obstetrics negative OB ROS                            Anesthesia Physical Anesthesia Plan  ASA: 3  Anesthesia Plan: MAC   Post-op Pain Management: Minimal or no pain anticipated   Induction: Intravenous  PONV Risk Score and Plan: 2 and Propofol infusion and TIVA  Airway Management Planned: Natural Airway and Simple Face Mask  Additional Equipment: None  Intra-op Plan:   Post-operative Plan:   Informed Consent:   Patient has DNR.      Plan Discussed with:   Anesthesia Plan Comments:        Anesthesia Quick Evaluation

## 2022-05-29 NOTE — Progress Notes (Signed)
Hematology and Oncology Follow Up Visit  Jessica [name] [id number] 11-13-1940 81 y.o. 05/29/2022   Principle Diagnosis:  Stage IIA (T2bN0M0) invasive ductal ca of the LEFT breast - TRIPLE NEGATIVE Staphylococcus aureus wound infection of the LEFT axilla Cellulitis of the left breast  Past Therapy:   Taxotere/Carboplatin - s/p cycle #4 -completed adjuvant therapy on 09/09/2017 Radiation therapy to the right breast.-Completed 4005 rad with an additional 1000 rad boost on 11/11/2017  Current Therapy: Observation   Interim History:  Jessica [name] is here today for an unscheduled visit.  Apparently, she saw her family doctor yesterday.  She been not feeling all that well.  She been feeling very tired.  She has had a lot of fatigue.  Her family doctor was very diligent and went ahead and did some lab work on her.  Surprising, hemoglobin was 8.  We saw Jessica [name] back in May, her hemoglobin was 12.9.  Back in May, after we saw Jessica [name], she had a stroke.  She was put on aspirin and Plavix.  She has been making a slow but steady recovery.  In talking to her today, she has noted that her stool has been dark for the past week and a half.  She has had no abdominal pain.  There is been no nausea or vomiting.  She has had some shortness of breath with exertion.  She does get around with a rolling walker.  Her appetite has also been down.  But we saw her today, her hemoglobin was 7.3.  I did going do a rectal exam on her.  Her stool is dark brown and heme positive.  As such, going to have to have her admitted.  This is some that is totally unrelated to her breast cancer.  She really is on no therapy for her breast cancer.  She finished chemotherapy 4 and half years ago.  She will need to have a upper and lower endoscopy.  I would think that she may have some gastritis from the aspirin.  I am sure that her aspirin and Plavix will be stopped temporarily.  Overall, I would say performance  status is probably ECOG 2.    Medications:   Allergies:  Allergies  Allergen Reactions   Advil [Ibuprofen] Hives and Itching   Codeine Hives   Penicillins Hives    Has patient had a PCN reaction causing immediate rash, facial/tongue/throat swelling, SOB or lightheadedness with hypotension: yes Has patient had a PCN reaction causing severe rash involving mucus membranes or skin necrosis:  no Has patient had a PCN reaction that required hospitalization:  no Has patient had a PCN reaction occurring within the last 10 years: no If all of the above answers are "NO", then may proceed with Cephalosporin use. Has patient had a PCN reaction causing immediate rash, facial/tongue/throat swelling, SOB or lightheadedness with hypotension: yes Has patient had a PCN reaction causing severe rash involving mucus membranes or skin necrosis:  no Has patient had a PCN reaction that required hospitalization:  no Has patient had a PCN reaction occurring within the last 10 years: no If all of the above answers are "NO", then may proceed with Cephalosporin use.    Sulfamethoxazole-Trimethoprim Hives   Band-Aid Plus Antibiotic [Bacitracin-Polymyxin B] Dermatitis   Benazepril Swelling    Possible cause of tongue swelling   Other Rash    Band-aid    Past Medical History, Surgical history, Social history, and Family History were reviewed and updated.  Review of  Systems: Review of Systems  Constitutional: Negative.   HENT: Negative.    Eyes: Negative.   Respiratory: Negative.    Cardiovascular: Negative.   Gastrointestinal: Negative.   Genitourinary: Negative.   Musculoskeletal: Negative.   Skin: Negative.   Neurological: Negative.   Endo/Heme/Allergies: Negative.   Psychiatric/Behavioral: Negative.       Physical Exam:  weight is 171 lb 11.8 oz (77.9 kg). Her oral temperature is 97.5 F (36.4 C) (abnormal). Her blood pressure is 149/50 (abnormal) and her pulse is 66. Her respiration is 15 and  oxygen saturation is 95%.   Wt Readings from Last 3 Encounters:  05/29/22 171 lb 11.8 oz (77.9 kg)  05/28/22 172 lb 1.9 oz (78.1 kg)  05/26/22 172 lb 2 oz (78.1 kg)  Vital signs were taken.  Temperature 98.4.  Pulse 96.  Blood pressure 125/70.  Weight is 175 pounds.  Physical Exam Vitals reviewed.  Constitutional:      Comments: Her breast exam shows right breast with no masses, edema or erythema.  There is no right axillary adenopathy.  Left breast shows the lumpectomy that is well-healed.  She has some contraction of the left breast.  There are some firmness at the lumpectomy site secondary to prior infection that required secondary intention.  There is no distinct mass in the left breast.  There is no left axillary adenopathy.  There is some telangiectasias on the left breast from radiation.  HENT:     Head: Normocephalic and atraumatic.  Eyes:     Pupils: Pupils are equal, round, and reactive to light.  Cardiovascular:     Rate and Rhythm: Normal rate and regular rhythm.     Heart sounds: Normal heart sounds.  Pulmonary:     Effort: Pulmonary effort is normal.     Breath sounds: Normal breath sounds.  Abdominal:     General: Bowel sounds are normal.     Palpations: Abdomen is soft.  Genitourinary:    Comments: Rectal exam shows some small external hemorrhoids.  She had no mass in the rectal vault.  Her stool was dark brown and heme positive. Musculoskeletal:        General: No tenderness or deformity. Normal range of motion.     Cervical back: Normal range of motion.  Lymphadenopathy:     Cervical: No cervical adenopathy.  Skin:    General: Skin is warm and dry.     Findings: No erythema or rash.  Neurological:     Mental Status: She is alert and oriented to person, place, and time.  Psychiatric:        Behavior: Behavior normal.        Thought Content: Thought content normal.        Judgment: Judgment normal.      Lab Results  Component Value Date   WBC 6.6  05/29/2022   HGB 8.3 (L) 05/29/2022   HCT 26.3 (L) 05/29/2022   MCV 86.2 05/29/2022   PLT 321 05/29/2022   Lab Results  Component Value Date   FERRITIN 7 (L) 05/28/2022   IRON 56 09/23/2021   TIBC 413 09/23/2021   UIBC 357 09/23/2021   IRONPCTSAT 13 (L) 09/23/2021   Lab Results  Component Value Date   RETICCTPCT 2.1 05/29/2022   RBC 3.05 (L) 05/29/2022   RBC 3.03 (L) 05/29/2022   No results found for: "KPAFRELGTCHN", "LAMBDASER", "KAPLAMBRATIO" No results found for: "IGGSERUM", "IGA", "IGMSERUM" No results found for: "TOTALPROTELP", "ALBUMINELP", "A1GS", "A2GS", "BETS", "BETA2SER", "GAMS", "  MSPIKE", "SPEI"   Chemistry      Component Value Date/Time   NA 143 05/29/2022 0149   NA 139 04/13/2022 1332   NA 143 10/08/2017 1042   NA 141 06/16/2017 1014   K 3.7 05/29/2022 0149   K 3.4 10/08/2017 1042   K 3.8 06/16/2017 1014   CL 105 05/29/2022 0149   CL 100 10/08/2017 1042   CO2 28 05/29/2022 0149   CO2 30 10/08/2017 1042   CO2 31 (H) 06/16/2017 1014   BUN 15 05/29/2022 0149   BUN 14 04/13/2022 1332   BUN 10 10/08/2017 1042   BUN 8.9 06/16/2017 1014   CREATININE 0.54 05/29/2022 0149   CREATININE 0.67 05/28/2022 1330   CREATININE 0.9 10/08/2017 1042   CREATININE 0.8 06/16/2017 1014      Component Value Date/Time   CALCIUM 8.8 (L) 05/29/2022 0149   CALCIUM 9.5 10/08/2017 1042   CALCIUM 10.1 06/16/2017 1014   ALKPHOS 67 05/29/2022 0149   ALKPHOS 68 10/08/2017 1042   ALKPHOS 64 06/16/2017 1014   AST 18 05/29/2022 0149   AST 16 05/28/2022 1330   AST 21 06/16/2017 1014   ALT 18 05/29/2022 0149   ALT 16 05/28/2022 1330   ALT 20 10/08/2017 1042   ALT 20 06/16/2017 1014   BILITOT 1.0 05/29/2022 0149   BILITOT 0.4 05/28/2022 1330   BILITOT 0.37 06/16/2017 1014      Impression and Plan: Ms. Antonucci is a very pleasant 81 yo caucasian female with stage IIA ductal carcinoma of the left breast.  Her breast cancer is TRIPLE NEGATIVE.    She had her partial mastectomy in  May 2018 followed by chemotherapy and radiation.  She completed chemotherapy in November 2018.  She then had radiation and completed radiation in January 2019.  Again, her problem is clearly gastroenterology.  She does have a GI bleed.  She is on aspirin and Plavix.  I am sure that Neurology will have to be involved with respect to her treatment for the stroke.  Her blood counts otherwise are looking pretty good.  I am sure that she will need iron in the hospital.  I am sure she will be transfused.  She may also need some folic acid.  I am sure that we will see her in the hospital.  Otherwise, she will keep her regular appointment with [place] unless we have to see her sooner after her hospitalization for the GI bleed.   [name], MD 7/28/20238:07 AM  ADDENDUM: She was admitted.  She has had a transfusion.  Hemoglobin is only 8.3.  I will order some IV iron for her since her ferritin in the office was only 8.  I will also put her on some folic acid.  We will have to see what the endoscopy shows.  Hopefully, she can come off the aspirin and Plavix for a little bit.  Again I do not think this is anything related to her breast cancer.  Of course, it is never obvious as to what recurrent breast cancer can do.  However, it would be highly unusual for her to cause GI bleeding.  We will have to see what the blood count is over the weekend.  Lattie Haw, MD  Oswaldo Milian 41:10

## 2022-05-29 NOTE — Consult Note (Signed)
Consultation Note   Referring Provider: [facility] PCP: [name], MD Primary Gastroenterologist:  Lucio Edward, MD Reason for consultation: anemia, [facility] Day: 2  Assessment / Plan:   81 yo female with chronic iron deficiency anemia followed by Dr. Marin Olp. Now with declining hgb. Baseline hgb mid 11- 12, now 7.3 on plavix / asa since May. At least one episode of rectal bleeding in June per daughter . Also stools dark over last couple of months. Rule erosive disease, PUD. Colon neoplasm not excluded given her history of colon polyps She has received a u PRBC and IV iron. Hgb improved to 8.6 today Patient doesn't want a colonoscopy. She understands that colon cancer is one possibility. She does agree to an EGD. We have scheduled her for an EGD today at 12:30pm. The risks and benefits of EGD with possible biopsies were discussed with the patient who agrees to proceed.  She has been NPO.   History of advanced colon polyps. Two polyps removed in 2015. One of the polyps was a TVA ( 1 cm). Prep was not very good. A three year follow up was recommended but not done  Chronic constipation. Manages with prn Miralax  Chronic GERD. Overall asymptomatic on PPI. Breakthrough symptoms occur depending on diet  CVA in May 2023. On plavix ( on hold) and asa. Last plavix dose was yesterday  History of stage IIA (T2bN0M0) invasive ductal ca of the LEFT breast s/p partial mastectomy in 2018 followed by chemotherapy and radiation.   See PMH for additional medical problems    HPI   Jessica [name] is a 81 y.o. female with a past medical history significant for hypertension, breast cancer in remission, chronic constipation, history of colon polyps ( TVA), hypothyroidism,  and CVA May 2023.  See PMH for any additional medical problems.  Patient admitted yesterday with symptomatic anemia and FOBT+.on plavix and ASA. Hgb 7.3, down from  12 just  two months ago. Ferritin is 7 but she has been iron deficient receiving iron infusions under direction of Hematology  Daughter at bedside and tells me that patient had an episode of blood in diaper in June. For last couple of months her stools have been very dark. She takes a daily baby asa, no other NSAIDS. She hasn't had any abdominal pain. No N/V. No weight loss. Acid reflux symptoms controlled with PPI. She takes Miralax as needed for chronic constipation.   Last dose of Plavix was yesterday.   Previous GI Evaluation      Recent Labs and Imaging No results found.  Labs:  Recent Labs    05/28/22 1330 05/28/22 1728 05/29/22 0149  WBC 6.6 6.1 6.6  HGB 7.3* 7.1* 8.3*  HCT 23.8* 22.8* 26.3*  PLT 364 336 321   Recent Labs    05/28/22 1330 05/28/22 1728 05/29/22 0149  NA 140 142 143  K 3.5 3.2* 3.7  CL 100 105 105  CO2 33* 30 28  GLUCOSE 100* 109* 92  BUN '17 17 15  '$ CREATININE 0.67 0.60 0.54  CALCIUM 9.4 9.2 8.8*   Recent Labs    05/29/22 0149  PROT 6.4*  ALBUMIN 3.6  AST 18  ALT 18  ALKPHOS 67  BILITOT  1.0   No results for input(s): "HEPBSAG", "HCVAB", "HEPAIGM", "HEPBIGM" in the last 72 hours. Recent Labs    05/28/22 1728  LABPROT 14.0  INR 1.1    Past Medical History:  Diagnosis Date   Allergy    Anemia    Anxiety    Asthma    in past, no inhalers now   Blood transfusion without reported diagnosis    Breast cancer ([place]) 01/2017   left breast    Breast cancer of upper-inner quadrant of left female breast ([place]) 03/11/2017   Cancer ([place]) 01/2017   left breast   Cataract    bilateral removed   Chronic kidney disease    kidney stones   Depression 08/31/2013   Dyslipidemia, goal LDL below 160 08/31/2013   Essential hypertension - well controlled 08/31/2013   Family history of breast cancer    Family history of melanoma    Family history of prostate cancer    GERD (gastroesophageal reflux disease)    Goals of care, counseling/discussion  03/11/2017   History of radiation therapy 10/13/17-11/11/17   left breast 40.05 Gy in 15 fractions, left breast boost 10 Gy in 5 fractions   Hypothyroidism    Ischemic stroke [facility])    May 2023;   Obesity (BMI 30-39.9) 08/31/2013   Personal history of chemotherapy 2018   Personal history of radiation therapy 2019   Thyroid disease    Vasovagal near-syncope -rare 08/31/2013    Past Surgical History:  Procedure Laterality Date   ABDOMINAL HYSTERECTOMY     total   BREAST BIOPSY Left 02/24/2017    malignant   BREAST LUMPECTOMY Left 03/23/2017   broken fingers     CATARACT EXTRACTION Bilateral    COLONOSCOPY     EXCISIONAL HEMORRHOIDECTOMY     IR FLUORO GUIDE PORT INSERTION RIGHT  08/04/2017   IR REMOVAL TUN ACCESS W/ PORT W/O FL MOD SED  08/04/2017   IR US GUIDE VASC ACCESS RIGHT  08/04/2017   IRRIGATION AND DEBRIDEMENT ABSCESS Left 05/18/2017   Procedure: IRRIGATION AND DEBRIDEMENT LEFT AXILLARY ABSCESS;  Surgeon: Michael Boston, MD;  Location: [facility];  Service: General;  Laterality: Left;   kidney stones lithotripsy     PORTACATH PLACEMENT Right 03/23/2017   Procedure: INSERTION PORT-A-CATH;  Surgeon: Erroll Luna, MD;  Location: [place];  Service: General;  Laterality: Right;   RADIOACTIVE SEED GUIDED PARTIAL MASTECTOMY WITH AXILLARY SENTINEL LYMPH NODE BIOPSY Left 03/23/2017   Procedure: LEFT BREAST RADIOACTIVE SEED GUIDED PARTIAL MASTECTOMY AND  SENTINEL LYMPH NODE [place];  Surgeon: Erroll Luna, MD;  Location: [place];  Service: General;  Laterality: Left;   ROTATOR CUFF REPAIR     left    TONSILLECTOMY     WISDOM TOOTH EXTRACTION      Family History  Problem Relation Age of Onset   Heart disease Mother        Angina   Heart disease Father    Cervical cancer Maternal Aunt    Heart attack Brother        Died of MI in his mid 47s   Prostate cancer Brother        dx in his early 71s   Breast cancer Paternal Aunt        dx over 107    Bone cancer Cousin        maternal first cousin dx in her 66s-60s   Lung cancer Cousin        paternal  first cousin   Colon cancer Neg Hx    Pancreatic cancer Neg Hx    Stomach cancer Neg Hx    Esophageal cancer Neg Hx    Rectal cancer Neg Hx     Prior to Admission medications   Medication Sig Start Date End Date Taking? Authorizing Provider  acetaminophen (TYLENOL) 325 MG tablet Take 2 tablets (650 mg total) by mouth every 6 (six) hours as needed for mild pain (or Fever >/= 101). 03/30/22  Yes Angiulli, Lavon Paganini, PA-C  amLODipine (NORVASC) 10 MG tablet Take 1 tablet (10 mg total) by mouth daily. 03/30/22  Yes Angiulli, Lavon Paganini, PA-C  aspirin EC 81 MG tablet Take 81 mg by mouth daily. Swallow whole.   Yes [provider]  atorvastatin (LIPITOR) 80 MG tablet Take 1 tablet (80 mg total) by mouth at bedtime. Patient taking differently: Take 80 mg by mouth daily. 03/30/22  Yes Angiulli, Lavon Paganini, PA-C  buPROPion (WELLBUTRIN XL) 150 MG 24 hr tablet Take 1 tablet (150 mg total) by mouth every morning. Patient taking differently: Take 150 mg by mouth daily. 03/30/22  Yes Angiulli, Lavon Paganini, PA-C  cholecalciferol (VITAMIN D3) 25 MCG (1000 UNIT) tablet Take 1 tablet (1,000 Units total) by mouth daily. 03/30/22  Yes Angiulli, Lavon Paganini, PA-C  clopidogrel (PLAVIX) 75 MG tablet 1 tablet daily through 06/24/2022 and stop Patient taking differently: Take 75 mg by mouth daily. 03/30/22  Yes Angiulli, Lavon Paganini, PA-C  diclofenac Sodium (VOLTAREN) 1 % GEL Apply 2 g topically 4 (four) times daily as needed (shoulder pain). 03/30/22  Yes Angiulli, Lavon Paganini, PA-C  gabapentin (NEURONTIN) 100 MG capsule Take 100 mg by mouth at bedtime.   Yes [provider]  hydrochlorothiazide (MICROZIDE) 12.5 MG capsule Take 1 capsule (12.5 mg total) by mouth daily. 03/30/22  Yes Angiulli, Lavon Paganini, PA-C  levothyroxine (SYNTHROID) 75 MCG tablet Take 1 tablet (75 mcg total) by mouth daily. 03/30/22  Yes Angiulli, Lavon Paganini, PA-C  Multiple Vitamins-Minerals (OCUVITE ADULT FORMULA PO) Take 1 tablet by mouth daily.   Yes [provider]  omeprazole (PRILOSEC) 40 MG capsule Take 40 mg by mouth daily.   Yes [provider]  polyethylene glycol (MIRALAX / GLYCOLAX) 17 g packet Take 17 g by mouth daily. Patient taking differently: Take 17 g by mouth daily as needed for mild constipation. 03/24/22  Yes Danford, Suann Larry, MD  potassium chloride (KLOR-CON M) 10 MEQ tablet Take 1 tablet (10 mEq total) by mouth 3 (three) times daily. 03/30/22  Yes Kirsteins, Luanna Salk, MD  venlafaxine XR (EFFEXOR-XR) 75 MG 24 hr capsule Take 3 capsules (225 mg total) by mouth daily. 03/30/22  Yes Angiulli, Lavon Paganini, PA-C  aspirin 81 MG chewable tablet Chew 1 tablet (81 mg total) by mouth daily. Patient not taking: Reported on 05/28/2022 03/24/22   Edwin Dada, MD  magnesium gluconate (MAGONATE) 500 MG tablet Take 1 tablet (500 mg total) by mouth at bedtime. Patient not taking: Reported on 05/26/2022 03/30/22   Angiulli, Lavon Paganini, PA-C  modafinil (PROVIGIL) 100 MG tablet Take 1 tablet (100 mg total) by mouth daily. Patient not taking: Reported on 05/26/2022 03/30/22   Angiulli, Lavon Paganini, PA-C  nitrofurantoin, macrocrystal-monohydrate, (MACROBID) 100 MG capsule Take 100 mg by mouth every 12 (twelve) hours. Patient not taking: Reported on 05/26/2022 04/27/22   [provider]  pantoprazole (PROTONIX) 40 MG tablet Take 1 tablet (40 mg total) by mouth daily. Patient not taking: Reported  on 05/26/2022 03/30/22   Cathlyn Parsons, PA-C    Current Facility-Administered Medications  Medication Dose Route Frequency Provider Last Rate Last Admin   acetaminophen (TYLENOL) tablet 650 mg  650 mg Oral Q6H PRN Howerter, Justin B, DO   650 mg at 05/28/22 2228   Or   acetaminophen (TYLENOL) suppository 650 mg  650 mg Rectal Q6H PRN Howerter, Justin B, DO       Chlorhexidine Gluconate Cloth 2 % PADS 6 each  6 each Topical  Daily Howerter, Justin B, DO       ferumoxytol (FERAHEME) 510 mg in sodium chloride 0.9 % 100 mL IVPB  510 mg Intravenous Once [name], MD       folic acid (FOLVITE) tablet 2 mg  2 mg Oral Daily Ennever, Rudell Cobb, MD       pantoprazole (PROTONIX) injection 40 mg  40 mg Intravenous Q12H Howerter, Justin B, DO       sodium chloride flush (NS) 0.9 % injection 10-40 mL  10-40 mL Intracatheter Q12H Howerter, Justin B, DO       sodium chloride flush (NS) 0.9 % injection 10-40 mL  10-40 mL Intracatheter PRN Howerter, Justin B, DO        Allergies as of 05/28/2022 - Review Complete 05/28/2022  Allergen Reaction Noted   Advil [ibuprofen] Hives and Itching 03/08/2017   Codeine Hives 09/12/2013   Penicillins Hives 08/29/2013   Sulfamethoxazole-trimethoprim Hives 08/06/2021   Band-aid plus antibiotic [bacitracin-polymyxin b] Dermatitis 04/07/2017   Benazepril Swelling 05/19/2017   Other Rash 09/09/2017    Social History   Socioeconomic History   Marital status: Married    Spouse name: Not on file   Number of children: 3   Years of education: Not on file   Highest education level: Not on file  Occupational History   Occupation: retired   Tobacco Use   Smoking status: Never    Passive exposure: Past   Smokeless tobacco: Never  Vaping Use   Vaping Use: Never used  Substance and Sexual Activity   Alcohol use: No   Drug use: No   Sexual activity: Not on file  Other Topics Concern   Not on file  Social History Narrative   Married mother of 3 with one grandchild. Never smoked. Does not drink alcohol.   Walks 3-4 days a week for roughly 20-30 minutes.   Social Determinants of Health   Financial Resource Strain: Not on file  Food Insecurity: Not on file  Transportation Needs: Not on file  Physical Activity: Not on file  Stress: Not on file  Social Connections: Not on file  Intimate Partner Violence: Not on file    Review of Systems: All systems reviewed and negative except  where noted in HPI.  Physical Exam: Vital signs in last 24 hours: Temp:  [97.5 F (36.4 C)-98.4 F (36.9 C)] 97.5 F (36.4 C) (07/28 0602) Pulse Rate:  [65-74] 66 (07/28 0602) Resp:  [15-20] 15 (07/28 0602) BP: (116-149)/(50-82) 149/50 (07/28 0602) SpO2:  [90 %-99 %] 95 % (07/28 0602) Weight:  [77.9 kg-78.1 kg] 77.9 kg (07/28 0500)    General:  Alert female in NAD Psych:  Pleasant, cooperative. Normal mood and affect Eyes: Pupils equal, no icterus. Conjunctive pink Ears:  Normal auditory acuity Nose: No deformity, discharge or lesions Neck:  Supple, no masses felt Lungs:  Clear to auscultation.  Heart:  Regular rate, regular rhythm. No lower extremity edema Abdomen:  Soft, nondistended, nontender,  active bowel sounds, no masses felt Rectal :  Deferred Msk: Symmetrical without gross deformities.  Neurologic:  Alert, oriented, grossly normal neurologically Skin:  Intact without significant lesions.    Intake/Output from previous day: 07/27 0701 - 07/28 0700 In: 402 [Blood:402] Out: 300 [Urine:300] Intake/Output this shift:  No intake/output data recorded.    Principal Problem:   Acute lower GI bleeding Active Problems:   Essential hypertension - well controlled   Dyslipidemia, goal LDL below 160   GERD (gastroesophageal reflux disease)   Acquired hypothyroidism   Depression with anxiety   Hypokalemia   Acute blood loss anemia    Tye Savoy, NP-C @  05/29/2022, 8:53 AM

## 2022-05-29 NOTE — Progress Notes (Signed)
PROGRESS NOTE Jessica [name]  DGU:[id number] DOB: 1941-01-16 DOA: 05/28/2022 PCP: [name], MD   Brief Narrative/Hospital Course: 58 F with past medical history of left breast invasive ductal carcinoma triple negative, history of staph wound  infection of the left axilla/cellulitis of the left breast followed by Dr. Marin Olp S/P cycle #4 completed adjuvant therapy 09/09/2017, history of stroke back in May with residual expressive aphasia and right hemiparesisplaced on aspirin and Plavix, HTN, HLD, GERD having dark stool past week and a half and blood work showed anemia, Hemoccult positive GI was consulted 1 unit PRBC transfused and patient was admitted for further management      Subjective: Seen and examined this morning.  Resting comfortably Has left sided weakness from prior stroke, daughter at the bedside.   Assessment and Plan: Principal Problem:   Acute lower GI bleeding Active Problems:   Essential hypertension - well controlled   Dyslipidemia, goal LDL below 160   GERD (gastroesophageal reflux disease)   Acquired hypothyroidism   Depression with anxiety   Hypokalemia   Acute blood loss anemia   Acute blood loss anemia Hemoccult positive stool/dark stool Suspected GI bleeding: Patient on aspirin Plavix for acute CVA in May,now with acute blood loss anemia, Hemoccult positive dark stool suspecting GI bleeding.  GI following, with transfusion hemoglobin coming up, aspirin Plavix on hold continue PPI twice daily, folic acid, Feraheme x1 ordered by oncology.  Waiting for EGD  Essential hypertension - well controlled, amlodipine on hold Dyslipidemia: On Lipitor GERD-cont ppi Acquired hypothyroidism: Resume Synthroid Depression with anxiety: On Wellbutrin, Neurontin Effexor, resume once able Hypokalemia:resolved Class I Obesity:Patient's Body mass index is 30.42 kg/m. : Will benefit with PCP follow-up, weight loss  healthy lifestyle and outpatient follow-up   DVT  prophylaxis: SCDs Start: 05/28/22 2028 Code Status:   Code Status: DNR Family Communication: plan of care discussed with patient/daughter at bedside. Patient status is: Inpatient because of work-up of blood loss anemia Level of care: Progressive   Dispo: The patient is from: Home with daughter            Anticipated disposition: Home with daughter over the weekend once cleared by GI  Mobility Assessment (last 72 hours)     Mobility Assessment     Row Name 05/28/22 2216           Does patient have an order for bedrest or is patient medically unstable No - Continue assessment       What is the highest level of mobility based on the progressive mobility assessment? Level 4 (Walks with assist in room) - Balance while marching in place and cannot step forward and back - Complete                 Objective: Vitals last 24 hrs: Vitals:   05/29/22 0204 05/29/22 0500 05/29/22 0602 05/29/22 1012  BP: (!) 140/56  (!) 149/50 (!) 159/55  Pulse: 67  66 68  Resp: 17  15   Temp: 98.1 F (36.7 C)  (!) 97.5 F (36.4 C) 98.9 F (37.2 C)  TempSrc: Oral  Oral Oral  SpO2: 92%  95% (!) 89%  Weight:  77.9 kg     Weight change:   Physical Examination: General exam: alert awake,older than stated age, weak appearing. HEENT:Oral mucosa moist, Ear/Nose WNL grossly, dentition normal. Respiratory system: bilaterally diminished BS, no use of accessory muscle Cardiovascular system: S1 & S2 +, No JVD. Gastrointestinal system: Abdomen soft,NT,ND, BS+ Nervous System:Alert, awake, moving  extremities with left-sided weakness  Extremities: LE edema neg,distal peripheral pulses palpable.  Skin: No rashes,no icterus. MSK: Normal muscle bulk,tone, power  Medications reviewed:  Scheduled Meds:  Chlorhexidine Gluconate Cloth  6 each Topical Daily   folic acid  2 mg Oral Daily   pantoprazole (PROTONIX) IV  40 mg Intravenous Q12H   sodium chloride flush  10-40 mL Intracatheter Q12H   Continuous  Infusions:    Diet Order             Diet NPO time specified  Diet effective midnight                  Intake/Output Summary (Last 24 hours) at 05/29/2022 1141 Last data filed at 05/29/2022 1136 Gross per 24 hour  Intake 402 ml  Output 900 ml  Net -498 ml   Net IO Since Admission: -498 mL [05/29/22 1141]  Wt Readings from Last 3 Encounters:  05/29/22 77.9 kg  05/28/22 78.1 kg  05/26/22 78.1 kg     Unresulted Labs (From admission, onward)    None     Data Reviewed: I have personally reviewed following labs and imaging studies CBC: Recent Labs  Lab 05/28/22 1330 05/28/22 1728 05/29/22 0149 05/29/22 0850  WBC 6.6 6.1 6.6  --   NEUTROABS 4.2 3.8 3.9  --   HGB 7.3* 7.1* 8.3* 8.6*  HCT 23.8* 22.8* 26.3* 27.2*  MCV 94.1 93.8 86.2  --   PLT 364 336 321  --    Basic Metabolic Panel: Recent Labs  Lab 05/28/22 1330 05/28/22 1728 05/29/22 0149  NA 140 142 143  K 3.5 3.2* 3.7  CL 100 105 105  CO2 33* 30 28  GLUCOSE 100* 109* 92  BUN '17 17 15  '$ CREATININE 0.67 0.60 0.54  CALCIUM 9.4 9.2 8.8*  MG  --   --  2.0   GFR: Estimated Creatinine Clearance: 55.4 mL/min (by C-G formula based on SCr of 0.54 mg/dL). Liver Function Tests: Recent Labs  Lab 05/28/22 1330 05/28/22 1728 05/29/22 0149  AST '16 17 18  '$ ALT '16 18 18  '$ ALKPHOS 79 68 67  BILITOT 0.4 0.4 1.0  PROT 6.7 6.7 6.4*  ALBUMIN 4.3 3.4* 3.6   No results for input(s): "LIPASE", "AMYLASE" in the last 168 hours. No results for input(s): "AMMONIA" in the last 168 hours. Coagulation Profile: Recent Labs  Lab 05/28/22 1728  INR 1.1   BNP (last 3 results) No results for input(s): "PROBNP" in the last 8760 hours. HbA1C: No results for input(s): "HGBA1C" in the last 72 hours. CBG: No results for input(s): "GLUCAP" in the last 168 hours. Lipid Profile: No results for input(s): "CHOL", "HDL", "LDLCALC", "TRIG", "CHOLHDL", "LDLDIRECT" in the last 72 hours. Thyroid Function Tests: No results for  input(s): "TSH", "T4TOTAL", "FREET4", "T3FREE", "THYROIDAB" in the last 72 hours. Sepsis Labs: No results for input(s): "PROCALCITON", "LATICACIDVEN" in the last 168 hours.  No results found for this or any previous visit (from the past 240 hour(s)).  Antimicrobials: Anti-infectives (From admission, onward)    None      Culture/Microbiology    Component Value Date/Time   SDES  12/31/2021 1908    URINE, CLEAN CATCH Performed at [facility], [address] [place],  [place] [address]    [facility]  12/31/2021 1908    NONE Performed at [facility], [address] [place], [place] [address]    CULT  12/31/2021 1908    NO GROWTH Performed at [facility], [place] [address] [place], [place] [address]    REPTSTATUS 01/02/2022 FINAL 12/31/2021 1908   Other culture-see note  Radiology Studies: No results found.   LOS: 0 days   Antonieta Pert, MD [facility]  05/29/2022, 11:41 AM

## 2022-05-29 NOTE — Op Note (Signed)
[facility] Patient Name: Jessica [name] Procedure Date: 05/29/2022 MRN: [id number] Attending MD: Jerene Bears , MD Date of Birth: 07-Jan-1941 CSN: [id number] Age: 81 Admit Type: Inpatient Procedure:                Upper GI endoscopy Indications:              Iron deficiency anemia, Heme positive stool Providers:                Lajuan Lines. Hilarie Fredrickson, MD, Jaci Carrel, RN, William Dalton, Technician Referring MD:             [facility] Medicines:                Monitored Anesthesia Care Complications:            No immediate complications. Estimated Blood Loss:     Estimated blood loss: none. Procedure:                Pre-Anesthesia Assessment:                           - Prior to the procedure, a History and Physical                            was performed, and patient medications and                            allergies were reviewed. The patient's tolerance of                            previous anesthesia was also reviewed. The risks                            and benefits of the procedure and the sedation                            options and risks were discussed with the patient.                            All questions were answered, and informed consent                            was obtained. Prior Anticoagulants: The patient has                            taken Plavix (clopidogrel), last dose was 1 day                            prior to procedure. ASA Grade Assessment: III - A                            patient with severe systemic disease. After  reviewing the risks and benefits, the patient was                            deemed in satisfactory condition to undergo the                            procedure.                           After obtaining informed consent, the endoscope was                            passed under direct vision. Throughout the                            procedure, the  patient's blood pressure, pulse, and                            oxygen saturations were monitored continuously. The                            [id number]-H190 ([id number]) Olympus endoscope was introduced                            through the mouth, and advanced to the second part                            of duodenum. The upper GI endoscopy was                            accomplished without difficulty. The patient                            tolerated the procedure well. Scope In: Scope Out: Findings:      A non-obstructing Schatzki ring was found at the gastroesophageal       junction. Esophagus otherwise normal.      A 2 cm hiatal hernia was present.      A small non-bleeding diverticulum was found in the gastric fundus.      The exam of the stomach was otherwise normal.      The examined duodenum was normal. Impression:               - Non-obstructing Schatzki ring. Normal esophageal                            mucosa.                           - 2 cm hiatal hernia.                           - Gastric (fundus) diverticulum without bleeding.                           - Normal examined duodenum. No overt villous  blunting.                           - No specimens collected. Moderate Sedation:      N/A Recommendation:           - Return patient to hospital ward for ongoing care.                           - Low residue diet.                           - Continue present medications.                           - Hold Plavix.                           - Colonoscopy recommended on Tuesday (after Plavix                            washout). Will need 2 day bowel preparation. Clear                            liquids 'Sunday (05/31/22) and Monday (06/01/22) with                            preparation to start Sunday afternoon.                           - Redose Feraheme on day #3; Monday. Procedure Code(s):        --- Professional ---                           43'$ 235,  Esophagogastroduodenoscopy, flexible,                            transoral; diagnostic, including collection of                            specimen(s) by brushing or washing, when performed                            (separate procedure) Diagnosis Code(s):        --- Professional ---                           K22.2, Esophageal obstruction                           K44.9, Diaphragmatic hernia without obstruction or                            gangrene                           K31.4, Gastric diverticulum  D50.9, Iron deficiency anemia, unspecified                           R19.5, Other fecal abnormalities CPT copyright 2019 [employer]. All rights reserved. The codes documented in this report are preliminary and upon coder review may  be revised to meet current compliance requirements. Jerene Bears, MD 05/29/2022 1:04:32 PM This report has been signed electronically. Number of Addenda: 0

## 2022-05-29 NOTE — Plan of Care (Signed)
  Problem: Education: Goal: Knowledge of General Education information will improve Description: Including pain rating scale, medication(s)/side effects and non-pharmacologic comfort measures Outcome: Progressing   Problem: Coping: Goal: Level of anxiety will decrease Outcome: Progressing   

## 2022-05-29 NOTE — Transfer of Care (Signed)
Immediate Anesthesia Transfer of Care Note  Patient: Jessica [name]  Procedure(s) Performed: ESOPHAGOGASTRODUODENOSCOPY (EGD) WITH PROPOFOL  Patient Location: PACU  Anesthesia Type:MAC  Level of Consciousness: drowsy  Airway & Oxygen Therapy: Patient Spontanous Breathing and Patient connected to face mask oxygen  Post-op Assessment: Report given to RN and Post -op Vital signs reviewed and stable  Post vital signs: Reviewed and stable  Last Vitals:  Vitals Value Taken Time  BP    Temp    Pulse    Resp    SpO2      Last Pain:  Vitals:   05/29/22 1204  TempSrc: Tympanic  PainSc: 0-[name] pain         Complications: [name] notable events documented.

## 2022-05-29 NOTE — Anesthesia Postprocedure Evaluation (Signed)
Anesthesia Post Note  Patient: Jessica [name]  Procedure(s) Performed: ESOPHAGOGASTRODUODENOSCOPY (EGD) WITH PROPOFOL     Patient location during evaluation: PACU Anesthesia Type: MAC Level of consciousness: awake and alert Pain management: pain level controlled Vital Signs Assessment: post-procedure vital signs reviewed and stable Respiratory status: spontaneous breathing, nonlabored ventilation and respiratory function stable Cardiovascular status: blood pressure returned to baseline and stable Postop Assessment: no apparent nausea or vomiting Anesthetic complications: no   No notable events documented.  Last Vitals:  Vitals:   05/29/22 1310 05/29/22 1311  BP: 129/86 129/86  Pulse: 69 69  Resp: 16 16  Temp:    SpO2: 90% 90%    Last Pain:  Vitals:   05/29/22 1259  TempSrc: Axillary  PainSc:                  Lynda Rainwater

## 2022-05-30 DIAGNOSIS — K222 Esophageal obstruction: Secondary | ICD-10-CM | POA: Diagnosis present

## 2022-05-30 DIAGNOSIS — B9561 Methicillin susceptible Staphylococcus aureus infection as the cause of diseases classified elsewhere: Secondary | ICD-10-CM | POA: Diagnosis present

## 2022-05-30 DIAGNOSIS — G4730 Sleep apnea, unspecified: Secondary | ICD-10-CM | POA: Diagnosis not present

## 2022-05-30 DIAGNOSIS — C50912 Malignant neoplasm of unspecified site of left female breast: Secondary | ICD-10-CM | POA: Diagnosis present

## 2022-05-30 DIAGNOSIS — I6381 Other cerebral infarction due to occlusion or stenosis of small artery: Secondary | ICD-10-CM | POA: Diagnosis not present

## 2022-05-30 DIAGNOSIS — F32A Depression, unspecified: Secondary | ICD-10-CM | POA: Diagnosis present

## 2022-05-30 DIAGNOSIS — K314 Gastric diverticulum: Secondary | ICD-10-CM | POA: Diagnosis present

## 2022-05-30 DIAGNOSIS — N390 Urinary tract infection, site not specified: Secondary | ICD-10-CM | POA: Diagnosis present

## 2022-05-30 DIAGNOSIS — I10 Essential (primary) hypertension: Secondary | ICD-10-CM | POA: Diagnosis present

## 2022-05-30 DIAGNOSIS — R1310 Dysphagia, unspecified: Secondary | ICD-10-CM | POA: Diagnosis not present

## 2022-05-30 DIAGNOSIS — D62 Acute posthemorrhagic anemia: Secondary | ICD-10-CM | POA: Diagnosis present

## 2022-05-30 DIAGNOSIS — I69351 Hemiplegia and hemiparesis following cerebral infarction affecting right dominant side: Secondary | ICD-10-CM | POA: Diagnosis not present

## 2022-05-30 DIAGNOSIS — K219 Gastro-esophageal reflux disease without esophagitis: Secondary | ICD-10-CM | POA: Diagnosis present

## 2022-05-30 DIAGNOSIS — Z7902 Long term (current) use of antithrombotics/antiplatelets: Secondary | ICD-10-CM | POA: Diagnosis not present

## 2022-05-30 DIAGNOSIS — Z6831 Body mass index (BMI) 31.0-31.9, adult: Secondary | ICD-10-CM | POA: Diagnosis not present

## 2022-05-30 DIAGNOSIS — R195 Other fecal abnormalities: Secondary | ICD-10-CM | POA: Diagnosis present

## 2022-05-30 DIAGNOSIS — B9620 Unspecified Escherichia coli [E. coli] as the cause of diseases classified elsewhere: Secondary | ICD-10-CM | POA: Diagnosis present

## 2022-05-30 DIAGNOSIS — Z66 Do not resuscitate: Secondary | ICD-10-CM | POA: Diagnosis present

## 2022-05-30 DIAGNOSIS — E876 Hypokalemia: Secondary | ICD-10-CM | POA: Diagnosis present

## 2022-05-30 DIAGNOSIS — D509 Iron deficiency anemia, unspecified: Secondary | ICD-10-CM | POA: Diagnosis not present

## 2022-05-30 DIAGNOSIS — K449 Diaphragmatic hernia without obstruction or gangrene: Secondary | ICD-10-CM | POA: Diagnosis present

## 2022-05-30 DIAGNOSIS — E785 Hyperlipidemia, unspecified: Secondary | ICD-10-CM | POA: Diagnosis present

## 2022-05-30 DIAGNOSIS — I69320 Aphasia following cerebral infarction: Secondary | ICD-10-CM | POA: Diagnosis not present

## 2022-05-30 DIAGNOSIS — D122 Benign neoplasm of ascending colon: Secondary | ICD-10-CM | POA: Diagnosis not present

## 2022-05-30 DIAGNOSIS — F419 Anxiety disorder, unspecified: Secondary | ICD-10-CM | POA: Diagnosis present

## 2022-05-30 DIAGNOSIS — K922 Gastrointestinal hemorrhage, unspecified: Secondary | ICD-10-CM | POA: Diagnosis present

## 2022-05-30 DIAGNOSIS — K625 Hemorrhage of anus and rectum: Secondary | ICD-10-CM | POA: Diagnosis present

## 2022-05-30 DIAGNOSIS — K5909 Other constipation: Secondary | ICD-10-CM | POA: Diagnosis present

## 2022-05-30 DIAGNOSIS — Z515 Encounter for palliative care: Secondary | ICD-10-CM | POA: Diagnosis not present

## 2022-05-30 DIAGNOSIS — E039 Hypothyroidism, unspecified: Secondary | ICD-10-CM | POA: Diagnosis present

## 2022-05-30 DIAGNOSIS — K635 Polyp of colon: Secondary | ICD-10-CM | POA: Diagnosis present

## 2022-05-30 DIAGNOSIS — D649 Anemia, unspecified: Secondary | ICD-10-CM | POA: Diagnosis not present

## 2022-05-30 DIAGNOSIS — D500 Iron deficiency anemia secondary to blood loss (chronic): Secondary | ICD-10-CM | POA: Diagnosis not present

## 2022-05-30 LAB — CBC
HCT: 29.3 % — ABNORMAL LOW (ref 36.0–46.0)
Hemoglobin: 8.9 g/dL — ABNORMAL LOW (ref 12.0–15.0)
MCH: 27.3 pg (ref 26.0–34.0)
MCHC: 30.4 g/dL (ref 30.0–36.0)
MCV: 89.9 fL (ref 80.0–100.0)
Platelets: 331 10*3/uL (ref 150–400)
RBC: 3.26 MIL/uL — ABNORMAL LOW (ref 3.87–5.11)
RDW: 18.9 % — ABNORMAL HIGH (ref 11.5–15.5)
WBC: 5.6 10*3/uL (ref 4.0–10.5)
nRBC: 0 % (ref 0.0–0.2)

## 2022-05-30 LAB — BASIC METABOLIC PANEL
Anion gap: 7 (ref 5–15)
BUN: 9 mg/dL (ref 8–23)
CO2: 25 mmol/L (ref 22–32)
Calcium: 9.2 mg/dL (ref 8.9–10.3)
Chloride: 108 mmol/L (ref 98–111)
Creatinine, Ser: 0.58 mg/dL (ref 0.44–1.00)
GFR, Estimated: >60 mL/min (ref >60)
Glucose, Bld: 149 mg/dL — ABNORMAL HIGH (ref 70–99)
Potassium: 3.3 mmol/L — ABNORMAL LOW (ref 3.5–5.1)
Sodium: 140 mmol/L (ref 135–145)

## 2022-05-30 MED ORDER — VENLAFAXINE HCL ER 75 MG PO CP24
225.0000 mg | ORAL_CAPSULE | Freq: Every day | ORAL | Status: DC
  Administered 2022-05-30 – 2022-06-05 (×7): 225 mg via ORAL
  Filled 2022-05-30 (×7): qty 1

## 2022-05-30 MED ORDER — ATORVASTATIN CALCIUM 40 MG PO TABS
80.0000 mg | ORAL_TABLET | Freq: Every day | ORAL | Status: DC
  Administered 2022-05-30 – 2022-06-05 (×7): 80 mg via ORAL
  Filled 2022-05-30 (×7): qty 2

## 2022-05-30 MED ORDER — GABAPENTIN 100 MG PO CAPS
100.0000 mg | ORAL_CAPSULE | Freq: Every day | ORAL | Status: DC
  Administered 2022-05-30 – 2022-06-04 (×6): 100 mg via ORAL
  Filled 2022-05-30 (×6): qty 1

## 2022-05-30 MED ORDER — BUPROPION HCL ER (XL) 150 MG PO TB24
150.0000 mg | ORAL_TABLET | Freq: Every day | ORAL | Status: DC
  Administered 2022-05-30 – 2022-06-05 (×7): 150 mg via ORAL
  Filled 2022-05-30 (×7): qty 1

## 2022-05-30 MED ORDER — AMLODIPINE BESYLATE 10 MG PO TABS
10.0000 mg | ORAL_TABLET | Freq: Every day | ORAL | Status: DC
  Administered 2022-05-30 – 2022-06-05 (×7): 10 mg via ORAL
  Filled 2022-05-30 (×7): qty 1

## 2022-05-30 MED ORDER — VITAMIN D 25 MCG (1000 UNIT) PO TABS
1000.0000 [IU] | ORAL_TABLET | Freq: Every day | ORAL | Status: DC
  Administered 2022-05-30 – 2022-06-05 (×6): 1000 [IU] via ORAL
  Filled 2022-05-30 (×7): qty 1

## 2022-05-30 MED ORDER — LEVOTHYROXINE SODIUM 50 MCG PO TABS
75.0000 ug | ORAL_TABLET | Freq: Every day | ORAL | Status: DC
  Administered 2022-05-30 – 2022-06-05 (×7): 75 ug via ORAL
  Filled 2022-05-30 (×8): qty 1

## 2022-05-30 MED ORDER — POTASSIUM CHLORIDE CRYS ER 20 MEQ PO TBCR
30.0000 meq | EXTENDED_RELEASE_TABLET | Freq: Once | ORAL | Status: AC
  Administered 2022-05-30: 30 meq via ORAL
  Filled 2022-05-30: qty 1

## 2022-05-30 NOTE — Plan of Care (Signed)
  Problem: Clinical Measurements: Goal: Ability to maintain clinical measurements within normal limits will improve Outcome: Progressing Goal: Will remain free from infection Outcome: Progressing   Problem: Activity: Goal: Risk for activity intolerance will decrease Outcome: Progressing   Problem: Safety: Goal: Ability to remain free from injury will improve Outcome: Progressing   

## 2022-05-30 NOTE — Progress Notes (Addendum)
PROGRESS NOTE Jessica [name]  UKG:[id number] DOB: 1941/01/12 DOA: 05/28/2022 PCP: [name], MD   Brief Narrative/Hospital Course: 74 F with past medical history of left breast invasive ductal carcinoma triple negative, history of staph wound  infection of the left axilla/cellulitis of the left breast followed by Dr. Marin Olp S/P cycle #4 completed adjuvant therapy 09/09/2017, history of stroke back in May with residual expressive aphasia and right hemiparesisplaced on aspirin and Plavix, HTN, HLD, GERD having dark stool past week and a half and blood work showed anemia, Hemoccult positive GI was consulted 1 unit PRBC transfused and patient was admitted for further management . 7/28-underwent EGD-Schatzki ring, hiatal hernia gastric diverticulum without bleeding-advised colonoscopy on Tuesday after Plavix washout, continue clear liquid diet until Monday   Subjective: Seen and examined.  Daughter at the bedside, no BM no abdominal pain Hemoglobin was uptrending No other new complaints Assessment and Plan: Principal Problem:   Acute lower GI bleeding Active Problems:   Essential hypertension - well controlled   Dyslipidemia, goal LDL below 160   GERD (gastroesophageal reflux disease)   Acquired hypothyroidism   Depression with anxiety   Hypokalemia   Acute blood loss anemia   Heme positive stool   Iron deficiency anemia due to chronic blood loss   Acute blood loss anemia Hemoccult positive stool/dark stool Suspected GI bleeding: Patient on aspirin Plavix for acute CVA in May,now with acute blood loss anemia, Hemoccult positive dark stool suspecting GI bleeding. Gi on board-7/28-underwent EGD-Schatzki ring, hiatal hernia gastric diverticulum without bleeding-advised colonoscopy on Tuesday after Plavix washout, continue clear liquid diet until Monday, cont ppi bid, serial h/h  s/p ferraheme iv.  Hemoglobin remains stable-monitor Recent Labs  Lab 05/28/22 1330 05/28/22 1728  05/29/22 0149 05/29/22 0850 05/30/22 1004  HGB 7.3* 7.1* 8.3* 8.6* 8.9*  HCT 23.8* 22.8* 26.3* 27.2* 29.3*    Essential hypertension: Borderline controlled,resume amlodipine. Cont to hold hctz and reasses.  Dyslipidemia: cont Lipitor GERD-cont ppi Acquired hypothyroidism: cont Synthroid Depression with anxiety: resume home Wellbutrin, Neurontin Effexor Hypokalemia:will replete po Class I Obesity:Patient's Body mass index is 30.42 kg/m. : Will benefit with PCP follow-up, weight loss  healthy lifestyle and outpatient follow-up  DVT prophylaxis: SCDs Start: 05/28/22 2028 Code Status:   Code Status: DNR Family Communication: plan of care discussed with patient/daughter at bedside. Patient status is: Inpatient because of work-up of blood loss anemia Level of care: Progressive   Dispo: The patient is from: Home with daughter            Anticipated disposition: Home with daughter after colonoscopy  Mobility Assessment (last 72 hours)     Mobility Assessment     Row Name 05/29/22 0740 05/28/22 2216         Does patient have an order for bedrest or is patient medically unstable No - Continue assessment No - Continue assessment      What is the highest level of mobility based on the progressive mobility assessment? Level 4 (Walks with assist in room) - Balance while marching in place and cannot step forward and back - Complete Level 4 (Walks with assist in room) - Balance while marching in place and cannot step forward and back - Complete                Objective: Vitals last 24 hrs: Vitals:   05/29/22 1513 05/29/22 2015 05/30/22 0514 05/30/22 0954  BP: 139/66 (!) 153/69 (!) 161/57 (!) 145/61  Pulse: 69 71 69  Resp: 20 18    Temp: 98.2 F (36.8 C) 99 F (37.2 C) 97.7 F (36.5 C)   TempSrc:  Oral Oral   SpO2: 92% 90% 93%   Weight:      Height:       Weight change: 0 kg  Physical Examination: General exam: AA oriented,, older than stated age, weak  appearing. HEENT:Oral mucosa moist, Ear/Nose WNL grossly, dentition normal. Respiratory system: bilaterally diminished, no use of accessory muscle Cardiovascular system: S1 & S2 +, No JVD,. Gastrointestinal system: Abdomen soft,NT,ND,BS+ Nervous System:Alert, awake, some weakness from previous stroke on the left side  Extremities: LE ankle edema neg, distal peripheral pulses palpable.  Skin: No rashes,no icterus. MSK: Normal muscle bulk,tone, power   Medications reviewed:  Scheduled Meds:  amLODipine  10 mg Oral Daily   atorvastatin  80 mg Oral Daily   buPROPion  150 mg Oral Daily   Chlorhexidine Gluconate Cloth  6 each Topical Daily   cholecalciferol  1,000 Units Oral Daily   folic acid  2 mg Oral Daily   gabapentin  100 mg Oral QHS   levothyroxine  75 mcg Oral Q0600   pantoprazole (PROTONIX) IV  40 mg Intravenous Q12H   [START ON 05/31/2022] polyethylene glycol powder  0.5 Container Oral Once   sodium chloride flush  10-40 mL Intracatheter Q12H   venlafaxine XR  225 mg Oral Daily   Continuous Infusions:  sodium chloride 20 mL/hr at 05/29/22 1859      Diet Order             Diet clear liquid Room service appropriate? Yes; Fluid consistency: Thin  Diet effective now           DIET SOFT Room service appropriate? Yes; Fluid consistency: Thin  Diet effective now                  Intake/Output Summary (Last 24 hours) at 05/30/2022 1134 Last data filed at 05/30/2022 0900 Gross per 24 hour  Intake 1015.94 ml  Output 1151 ml  Net -135.06 ml   Net IO Since Admission: -33.06 mL [05/30/22 1134]  Wt Readings from Last 3 Encounters:  05/29/22 77.9 kg  05/28/22 78.1 kg  05/26/22 78.1 kg     Unresulted Labs (From admission, onward)     Start     Ordered   05/31/22 0500  CBC  Tomorrow morning,   R       Question:  Specimen collection method  Answer:  IV Team=IV Team collect   05/30/22 0960          Data Reviewed: I have personally reviewed following labs and imaging  studies CBC: Recent Labs  Lab 05/28/22 1330 05/28/22 1728 05/29/22 0149 05/29/22 0850 05/30/22 1004  WBC 6.6 6.1 6.6  --  5.6  NEUTROABS 4.2 3.8 3.9  --   --   HGB 7.3* 7.1* 8.3* 8.6* 8.9*  HCT 23.8* 22.8* 26.3* 27.2* 29.3*  MCV 94.1 93.8 86.2  --  89.9  PLT 364 336 321  --  454   Basic Metabolic Panel: Recent Labs  Lab 05/28/22 1330 05/28/22 1728 05/29/22 0149 05/30/22 0700  NA 140 142 143 140  K 3.5 3.2* 3.7 3.3*  CL 100 105 105 108  CO2 33* '30 28 25  '$ GLUCOSE 100* 109* 92 149*  BUN '17 17 15 9  '$ CREATININE 0.67 0.60 0.54 0.58  CALCIUM 9.4 9.2 8.8* 9.2  MG  --   --  2.0  --  GFR: Estimated Creatinine Clearance: 55.4 mL/min (by C-G formula based on SCr of 0.58 mg/dL). Liver Function Tests: Recent Labs  Lab 05/28/22 1330 05/28/22 1728 05/29/22 0149  AST '16 17 18  '$ ALT '16 18 18  '$ ALKPHOS 79 68 67  BILITOT 0.4 0.4 1.0  PROT 6.7 6.7 6.4*  ALBUMIN 4.3 3.4* 3.6   No results for input(s): "LIPASE", "AMYLASE" in the last 168 hours. No results for input(s): "AMMONIA" in the last 168 hours. Coagulation Profile: Recent Labs  Lab 05/28/22 1728  INR 1.1   BNP (last 3 results) No results for input(s): "PROBNP" in the last 8760 hours. HbA1C: No results for input(s): "HGBA1C" in the last 72 hours. CBG: No results for input(s): "GLUCAP" in the last 168 hours. Lipid Profile: No results for input(s): "CHOL", "HDL", "LDLCALC", "TRIG", "CHOLHDL", "LDLDIRECT" in the last 72 hours. Thyroid Function Tests: No results for input(s): "TSH", "T4TOTAL", "FREET4", "T3FREE", "THYROIDAB" in the last 72 hours. Sepsis Labs: No results for input(s): "PROCALCITON", "LATICACIDVEN" in the last 168 hours.  No results found for this or any previous visit (from the past 240 hour(s)).  Antimicrobials: Anti-infectives (From admission, onward)    None      Culture/Microbiology    Component Value Date/Time   SDES  12/31/2021 1908    URINE, CLEAN CATCH Performed at [facility], [address], [place], [place] [address]    [facility]  12/31/2021 1908    NONE Performed at [facility], [address], [place], [place] [address]    [place]  12/31/2021 1908    NO GROWTH Performed at [facility] [address], [place], [place] [address]    REPTSTATUS 01/02/2022 FINAL 12/31/2021 1908   Other culture-see note  Radiology Studies: No results found.   LOS: 0 days   Antonieta Pert, MD [facility]  05/30/2022, 11:34 AM

## 2022-05-31 DIAGNOSIS — D500 Iron deficiency anemia secondary to blood loss (chronic): Secondary | ICD-10-CM

## 2022-05-31 DIAGNOSIS — Z7902 Long term (current) use of antithrombotics/antiplatelets: Secondary | ICD-10-CM

## 2022-05-31 DIAGNOSIS — R195 Other fecal abnormalities: Secondary | ICD-10-CM | POA: Diagnosis not present

## 2022-05-31 DIAGNOSIS — K922 Gastrointestinal hemorrhage, unspecified: Secondary | ICD-10-CM | POA: Diagnosis not present

## 2022-05-31 LAB — CBC
HCT: 28.3 % — ABNORMAL LOW (ref 36.0–46.0)
Hemoglobin: 8.7 g/dL — ABNORMAL LOW (ref 12.0–15.0)
MCH: 27.8 pg (ref 26.0–34.0)
MCHC: 30.7 g/dL (ref 30.0–36.0)
MCV: 90.4 fL (ref 80.0–100.0)
Platelets: 324 10*3/uL (ref 150–400)
RBC: 3.13 MIL/uL — ABNORMAL LOW (ref 3.87–5.11)
RDW: 19 % — ABNORMAL HIGH (ref 11.5–15.5)
WBC: 6.6 10*3/uL (ref 4.0–10.5)
nRBC: 0 % (ref 0.0–0.2)

## 2022-05-31 MED ORDER — BISACODYL 5 MG PO TBEC
10.0000 mg | DELAYED_RELEASE_TABLET | Freq: Once | ORAL | Status: AC
  Administered 2022-05-31: 10 mg via ORAL
  Filled 2022-05-31: qty 2

## 2022-05-31 MED ORDER — SODIUM CHLORIDE 0.9 % IV SOLN
510.0000 mg | Freq: Once | INTRAVENOUS | Status: AC
  Administered 2022-06-01: 510 mg via INTRAVENOUS
  Filled 2022-05-31: qty 17

## 2022-05-31 NOTE — Plan of Care (Signed)
  Problem: Education: Goal: Knowledge of General Education information will improve Description: Including pain rating scale, medication(s)/side effects and non-pharmacologic comfort measures Outcome: Progressing   Problem: Coping: Goal: Level of anxiety will decrease Outcome: Progressing   Problem: Safety: Goal: Ability to remain free from injury will improve Outcome: Progressing   

## 2022-05-31 NOTE — Progress Notes (Signed)
PROGRESS NOTE Jessica [name]  HCW:[id number] DOB: January 26, 1941 DOA: 05/28/2022 PCP: [name], MD   Brief Narrative/Hospital Course: 30 F with past medical history of left breast invasive ductal carcinoma triple negative, history of staph wound  infection of the left axilla/cellulitis of the left breast followed by Dr. Marin Olp S/P cycle #4 completed adjuvant therapy 09/09/2017, history of stroke back in May with residual expressive aphasia and right hemiparesisplaced on aspirin and Plavix, HTN, HLD, GERD having dark stool past week and a half and blood work showed anemia, Hemoccult positive GI was consulted 1 unit PRBC transfused and patient was admitted for further management . 7/28-underwent EGD-Schatzki ring, hiatal hernia gastric diverticulum without bleeding-advised colonoscopy on Tuesday after Plavix washout, continue clear liquid diet until Monday     Subjective: bored   Assessment and Plan: Principal Problem:   Acute lower GI bleeding Active Problems:   Essential hypertension - well controlled   Dyslipidemia, goal LDL below 160   GERD (gastroesophageal reflux disease)   Acquired hypothyroidism   Depression with anxiety   Hypokalemia   Acute blood loss anemia   Heme positive stool   Iron deficiency anemia due to chronic blood loss    Acute blood loss anemia Hemoccult positive stool/dark stool Suspected GI bleeding: Patient on aspirin Plavix for acute CVA in May,now with acute blood loss anemia, Hemoccult positive dark stool suspecting GI bleeding. Gi on board-7/28-underwent EGD-Schatzki ring, hiatal hernia gastric diverticulum without bleeding-advised colonoscopy on Tuesday after Plavix washout, continue clear liquid diet until Monday, cont ppi bid, serial h/h  s/p ferraheme iv.  Hemoglobin remains stable-monitor   Essential hypertension: Borderline controlled,resume amlodipine. Cont to hold hctz and reasses.  Dyslipidemia: cont Lipitor GERD-cont ppi Acquired  hypothyroidism: cont Synthroid Depression with anxiety: resume home Wellbutrin, Neurontin Effexor Hypokalemia:will replete po Class I Obesity:Patient's Body mass index is 30.93 kg/m. : Will benefit with PCP follow-up, weight loss  healthy lifestyle and outpatient follow-up  DVT prophylaxis: SCDs Start: 05/28/22 2028 Code Status:   Code Status: DNR Family Communication: plan of care discussed with patient/daughter at bedside. Patient status is: Inpatient because of work-up of blood loss anemia Level of care: change to med surge  Dispo: The patient is from: Home with daughter            Anticipated disposition: Home with daughter after colonoscopy   Objective: Vitals last 24 hrs: Vitals:   05/30/22 2156 05/31/22 0500 05/31/22 0646 05/31/22 0957  BP: (!) 134/53  (!) 152/58 140/68  Pulse: 81  72   Resp: 20  18   Temp: 99.1 F (37.3 C)  97.8 F (36.6 C)   TempSrc: Oral  Oral   SpO2: 94%  95%   Weight:  79.2 kg    Height:        Physical Examination:  General: Appearance:    Obese female in no acute distress     Lungs:      respirations unlabored  Heart:    Normal heart rate. Normal rhythm. No murmurs, rubs, or gallops.   MS:   All extremities are intact.   Neurologic:   Awake, alert, oriented x 3      Medications reviewed:  Scheduled Meds:  amLODipine  10 mg Oral Daily   atorvastatin  80 mg Oral Daily   bisacodyl  10 mg Oral Once   buPROPion  150 mg Oral Daily   Chlorhexidine Gluconate Cloth  6 each Topical Daily   cholecalciferol  1,000 Units Oral Daily  folic acid  2 mg Oral Daily   gabapentin  100 mg Oral QHS   levothyroxine  75 mcg Oral Q0600   pantoprazole (PROTONIX) IV  40 mg Intravenous Q12H   polyethylene glycol powder  0.5 Container Oral Once   sodium chloride flush  10-40 mL Intracatheter Q12H   venlafaxine XR  225 mg Oral Daily   Continuous Infusions:  sodium chloride 10 mL/hr at 05/31/22 0610      Diet Order             Diet clear liquid Room  service appropriate? Yes; Fluid consistency: Thin  Diet effective now                  Intake/Output Summary (Last 24 hours) at 05/31/2022 1202 Last data filed at 05/31/2022 0700 Gross per 24 hour  Intake 617.86 ml  Output 775 ml  Net -157.14 ml   Net IO Since Admission: -190.2 mL [05/31/22 1202]  Wt Readings from Last 3 Encounters:  05/31/22 79.2 kg  05/28/22 78.1 kg  05/26/22 78.1 kg     Unresulted Labs (From admission, onward)    None     Data Reviewed: I have personally reviewed following labs and imaging studies CBC: Recent Labs  Lab 05/28/22 1330 05/28/22 1728 05/29/22 0149 05/29/22 0850 05/30/22 1004 05/31/22 0405  WBC 6.6 6.1 6.6  --  5.6 6.6  NEUTROABS 4.2 3.8 3.9  --   --   --   HGB 7.3* 7.1* 8.3* 8.6* 8.9* 8.7*  HCT 23.8* 22.8* 26.3* 27.2* 29.3* 28.3*  MCV 94.1 93.8 86.2  --  89.9 90.4  PLT 364 336 321  --  331 867   Basic Metabolic Panel: Recent Labs  Lab 05/28/22 1330 05/28/22 1728 05/29/22 0149 05/30/22 0700  NA 140 142 143 140  K 3.5 3.2* 3.7 3.3*  CL 100 105 105 108  CO2 33* '30 28 25  '$ GLUCOSE 100* 109* 92 149*  BUN '17 17 15 9  '$ CREATININE 0.67 0.60 0.54 0.58  CALCIUM 9.4 9.2 8.8* 9.2  MG  --   --  2.0  --    GFR: Estimated Creatinine Clearance: 55.9 mL/min (by C-G formula based on SCr of 0.58 mg/dL). Liver Function Tests: Recent Labs  Lab 05/28/22 1330 05/28/22 1728 05/29/22 0149  AST '16 17 18  '$ ALT '16 18 18  '$ ALKPHOS 79 68 67  BILITOT 0.4 0.4 1.0  PROT 6.7 6.7 6.4*  ALBUMIN 4.3 3.4* 3.6   No results for input(s): "LIPASE", "AMYLASE" in the last 168 hours. No results for input(s): "AMMONIA" in the last 168 hours. Coagulation Profile: Recent Labs  Lab 05/28/22 1728  INR 1.1   BNP (last 3 results) No results for input(s): "PROBNP" in the last 8760 hours. HbA1C: No results for input(s): "HGBA1C" in the last 72 hours. CBG: No results for input(s): "GLUCAP" in the last 168 hours. Lipid Profile: No results for input(s):  "CHOL", "HDL", "LDLCALC", "TRIG", "CHOLHDL", "LDLDIRECT" in the last 72 hours. Thyroid Function Tests: No results for input(s): "TSH", "T4TOTAL", "FREET4", "T3FREE", "THYROIDAB" in the last 72 hours. Sepsis Labs: No results for input(s): "PROCALCITON", "LATICACIDVEN" in the last 168 hours.  No results found for this or any previous visit (from the past 240 hour(s)).  Antimicrobials: Anti-infectives (From admission, onward)    None      Culture/Microbiology    Component Value Date/Time   SDES  12/31/2021 1908    URINE, CLEAN CATCH Performed at [facility], [address] [place], [place] [address]    [facility]  12/31/2021 1908    NONE Performed at [facility], [address] [place], [place] [address]    CULT  12/31/2021 1908    NO GROWTH Performed at [facility] [address] [place], [place] [address]    REPTSTATUS 01/02/2022 FINAL 12/31/2021 1908   Other culture-see note  Radiology Studies: No results found.   LOS: 1 day   Geradine Girt, DO [facility]  05/31/2022, 12:02 PM

## 2022-05-31 NOTE — Progress Notes (Signed)
    Progress Note   Assessment    81 year old female with history of GERD, advanced colonic polyps (2015 tubulovillous adenoma and less than ideal prep), breast cancer treated in 2018, CVA in May 2023 on Plavix and aspirin, chronic iron deficiency admitted with acute on chronic anemia and heme positive stool.   Recommendations   1.  Acute on chronic iron deficiency anemia/heme positive stool --precipitous decline in hemoglobin really since Plavix initiation.  Upper endoscopy unrevealing.  Patient agreeable now to colonoscopy. --Continue to hold Plavix, washout date is Tuesday, 06/02/2022 --2-day bowel prep; clear liquids have started today; Dulcolax 10 mg early afternoon; half MiraLAX prep this evening; full colonoscopy prep tomorrow and likely additional prep Tuesday morning --Dr. Candis Schatz will assume GI service tomorrow but will perform colonoscopy likely early afternoon Tuesday --She has received IV iron and should get her second dose of Feraheme today or tomorrow; defer to primary hospital medicine and pharmacy  2.  GERD --continue chronic PPI   Chief Complaint   Patient feeling well, started clear liquids this morning No bowel movement in multiple days No abdominal pain other than soreness "when someone pushes on it hard" Daughter at bedside  Vital signs in last 24 hours: Temp:  [97.8 F (36.6 C)-99.1 F (37.3 C)] 97.8 F (36.6 C) (07/30 0646) Pulse Rate:  [65-81] 72 (07/30 0646) Resp:  [18-20] 18 (07/30 0646) BP: (134-152)/(53-68) 140/68 (07/30 0957) SpO2:  [94 %-95 %] 95 % (07/30 0646) Weight:  [79.2 kg] 79.2 kg (07/30 0500)   Gen: awake, alert, NAD HEENT: anicteric, op clear Abd: soft, NT/ND, +BS throughout Ext: no c/c/e Neuro: nonfocal, flat affect   Intake/Output from previous day: 07/29 0701 - 07/30 0700 In: 857.9 [P.O.:610; I.V.:247.9] Out: 775 [Urine:775] Intake/Output this shift: No intake/output data recorded.  Lab Results: Recent Labs     05/29/22 0149 05/29/22 0850 05/30/22 1004 05/31/22 0405  WBC 6.6  --  5.6 6.6  HGB 8.3* 8.6* 8.9* 8.7*  HCT 26.3* 27.2* 29.3* 28.3*  PLT 321  --  331 324   BMET Recent Labs    05/28/22 1728 05/29/22 0149 05/30/22 0700  NA 142 143 140  K 3.2* 3.7 3.3*  CL 105 105 108  CO2 '30 28 25  '$ GLUCOSE 109* 92 149*  BUN '17 15 9  '$ CREATININE 0.60 0.54 0.58  CALCIUM 9.2 8.8* 9.2   LFT Recent Labs    05/29/22 0149  PROT 6.4*  ALBUMIN 3.6  AST 18  ALT 18  ALKPHOS 67  BILITOT 1.0   PT/INR Recent Labs    05/28/22 1728  LABPROT 14.0  INR 1.1     LOS: 1 day   Jerene Bears, MD 05/31/2022, 10:08 AM See Shea Evans, [facility], to contact our on call provider

## 2022-06-01 ENCOUNTER — Encounter (HOSPITAL_COMMUNITY): Payer: Self-pay | Admitting: Internal Medicine

## 2022-06-01 DIAGNOSIS — K922 Gastrointestinal hemorrhage, unspecified: Secondary | ICD-10-CM | POA: Diagnosis not present

## 2022-06-01 LAB — URINALYSIS, ROUTINE W REFLEX MICROSCOPIC
Bilirubin Urine: NEGATIVE
Glucose, UA: NEGATIVE mg/dL
Ketones, ur: NEGATIVE mg/dL
Nitrite: POSITIVE — AB
Protein, ur: NEGATIVE mg/dL
Specific Gravity, Urine: 1.011 (ref 1.005–1.030)
pH: 7 (ref 5.0–8.0)

## 2022-06-01 LAB — BASIC METABOLIC PANEL
Anion gap: 8 (ref 5–15)
BUN: 6 mg/dL — ABNORMAL LOW (ref 8–23)
CO2: 25 mmol/L (ref 22–32)
Calcium: 9.2 mg/dL (ref 8.9–10.3)
Chloride: 109 mmol/L (ref 98–111)
Creatinine, Ser: 0.51 mg/dL (ref 0.44–1.00)
GFR, Estimated: >60 mL/min (ref >60)
Glucose, Bld: 89 mg/dL (ref 70–99)
Potassium: 3.3 mmol/L — ABNORMAL LOW (ref 3.5–5.1)
Sodium: 142 mmol/L (ref 135–145)

## 2022-06-01 LAB — CBC
HCT: 31.2 % — ABNORMAL LOW (ref 36.0–46.0)
Hemoglobin: 9.3 g/dL — ABNORMAL LOW (ref 12.0–15.0)
MCH: 27.2 pg (ref 26.0–34.0)
MCHC: 29.8 g/dL — ABNORMAL LOW (ref 30.0–36.0)
MCV: 91.2 fL (ref 80.0–100.0)
Platelets: 354 10*3/uL (ref 150–400)
RBC: 3.42 MIL/uL — ABNORMAL LOW (ref 3.87–5.11)
RDW: 18.3 % — ABNORMAL HIGH (ref 11.5–15.5)
WBC: 9.4 10*3/uL (ref 4.0–10.5)
nRBC: 0 % (ref 0.0–0.2)

## 2022-06-01 MED ORDER — POLYETHYLENE GLYCOL 3350 17 G PO PACK
17.0000 g | PACK | Freq: Once | ORAL | Status: AC
Start: 2022-06-01 — End: 2022-06-01
  Administered 2022-06-01: 17 g via ORAL
  Filled 2022-06-01: qty 1

## 2022-06-01 MED ORDER — POTASSIUM CHLORIDE CRYS ER 20 MEQ PO TBCR
40.0000 meq | EXTENDED_RELEASE_TABLET | Freq: Once | ORAL | Status: DC
  Filled 2022-06-01: qty 2

## 2022-06-01 MED ORDER — HYDRALAZINE HCL 20 MG/ML IJ SOLN: 5.0000 mg | INTRAMUSCULAR | Status: DC | PRN

## 2022-06-01 NOTE — Progress Notes (Addendum)
PROGRESS NOTE Jessica [name]  HUT:[id number] DOB: Mar 10, 1941 DOA: 05/28/2022 PCP: [name], MD   Brief Narrative/Hospital Course: 63 F with past medical history of left breast invasive ductal carcinoma triple negative, history of staph wound  infection of the left axilla/cellulitis of the left breast followed by Dr. Marin Olp S/P cycle #4 completed adjuvant therapy 09/09/2017, history of stroke back in May with residual expressive aphasia and right hemiparesisplaced on aspirin and Plavix, HTN, HLD, GERD having dark stool past week and a half and blood work showed anemia, Hemoccult positive GI was consulted 1 unit PRBC transfused and patient was admitted for further management . 7/28-underwent EGD-Schatzki ring, hiatal hernia gastric diverticulum without bleeding-advised colonoscopy on Tuesday after Plavix washout, continue clear liquid diet.       Subjective: sleeping   Assessment and Plan: Principal Problem:   Acute lower GI bleeding Active Problems:   Essential hypertension - well controlled   Dyslipidemia, goal LDL below 160   GERD (gastroesophageal reflux disease)   Acquired hypothyroidism   Depression with anxiety   Hypokalemia   Acute blood loss anemia   Heme positive stool   Iron deficiency anemia due to chronic blood loss    Acute blood loss anemia Hemoccult positive stool/dark stool Suspected GI bleeding: Patient on aspirin Plavix for acute CVA in May,now with acute blood loss anemia, Hemoccult positive dark stool suspecting GI bleeding. Gi on board-7/28-underwent EGD-Schatzki ring, hiatal hernia gastric diverticulum without bleeding-advised colonoscopy on Tuesday after Plavix washout, continue clear liquid diet until Monday, cont ppi bid, serial h/h  s/p ferraheme iv.  Hemoglobin remains stable-monitor   Non-compliance with treatment -r/o UTI -delirium precautions -palliative care consult for GOC  Essential hypertension: Borderline controlled,resume amlodipine.  Cont to hold hctz and reasses.  Dyslipidemia: cont Lipitor GERD-cont ppi Acquired hypothyroidism: cont Synthroid Depression with anxiety: resume home Wellbutrin, Neurontin Effexor Hypokalemia:will replete po Class I Obesity:Patient's Body mass index is 31.32 kg/m. : Will benefit with PCP follow-up, weight loss  healthy lifestyle and outpatient follow-up  DVT prophylaxis: SCDs Start: 05/28/22 2028 Code Status:   Code Status: DNR Family Communication: plan of care discussed with patient/daughter at bedside 7/30 Patient status is: Inpatient because of work-up of blood loss anemia Level of care: change to med surge  Dispo: The patient is from: Home with daughter            Anticipated disposition: Home with daughter after colonoscopy   Objective: Vitals last 24 hrs: Vitals:   05/31/22 0957 05/31/22 1330 06/01/22 0440 06/01/22 0500  BP: 140/68 (!) 145/72 (!) 143/76   Pulse:  74 79   Resp:  20 20   Temp:  97.8 F (36.6 C) 97.8 F (36.6 C)   TempSrc:  Oral Oral   SpO2:  98% 97%   Weight:    80.2 kg  Height:        Physical Examination:   General: Appearance:    Obese female in no acute distress, sleeping     Lungs:     respirations unlabored  Heart:    Normal heart rate.   MS:   All extremities are intact.   Neurologic:   Awake, alert        Medications reviewed:  Scheduled Meds:  amLODipine  10 mg Oral Daily   atorvastatin  80 mg Oral Daily   buPROPion  150 mg Oral Daily   Chlorhexidine Gluconate Cloth  6 each Topical Daily   cholecalciferol  1,000 Units Oral Daily  folic acid  2 mg Oral Daily   gabapentin  100 mg Oral QHS   levothyroxine  75 mcg Oral Q0600   pantoprazole (PROTONIX) IV  40 mg Intravenous Q12H   potassium chloride  40 mEq Oral Once   sodium chloride flush  10-40 mL Intracatheter Q12H   venlafaxine XR  225 mg Oral Daily   Continuous Infusions:  sodium chloride 20 mL/hr at 06/01/22 6389      Diet Order             Diet clear liquid Room  service appropriate? Yes; Fluid consistency: Thin  Diet effective now                  Intake/Output Summary (Last 24 hours) at 06/01/2022 1139 Last data filed at 06/01/2022 0916 Gross per 24 hour  Intake 1107.6 ml  Output 700 ml  Net 407.6 ml   Net IO Since Admission: 217.4 mL [06/01/22 1139]  Wt Readings from Last 3 Encounters:  06/01/22 80.2 kg  05/28/22 78.1 kg  05/26/22 78.1 kg     Unresulted Labs (From admission, onward)    None     Data Reviewed: I have personally reviewed following labs and imaging studies CBC: Recent Labs  Lab 05/28/22 1330 05/28/22 1330 05/28/22 1728 05/29/22 0149 05/29/22 0850 05/30/22 1004 05/31/22 0405 06/01/22 0338  WBC 6.6  --  6.1 6.6  --  5.6 6.6 9.4  NEUTROABS 4.2  --  3.8 3.9  --   --   --   --   HGB 7.3*   < > 7.1* 8.3* 8.6* 8.9* 8.7* 9.3*  HCT 23.8*  --  22.8* 26.3* 27.2* 29.3* 28.3* 31.2*  MCV 94.1  --  93.8 86.2  --  89.9 90.4 91.2  PLT 364  --  336 321  --  331 324 354   < > = values in this interval not displayed.   Basic Metabolic Panel: Recent Labs  Lab 05/28/22 1330 05/28/22 1728 05/29/22 0149 05/30/22 0700 06/01/22 0338  NA 140 142 143 140 142  K 3.5 3.2* 3.7 3.3* 3.3*  CL 100 105 105 108 109  CO2 33* '30 28 25 25  '$ GLUCOSE 100* 109* 92 149* 89  BUN '17 17 15 9 '$ 6*  CREATININE 0.67 0.60 0.54 0.58 0.51  CALCIUM 9.4 9.2 8.8* 9.2 9.2  MG  --   --  2.0  --   --    GFR: Estimated Creatinine Clearance: 56.2 mL/min (by C-G formula based on SCr of 0.51 mg/dL). Liver Function Tests: Recent Labs  Lab 05/28/22 1330 05/28/22 1728 05/29/22 0149  AST '16 17 18  '$ ALT '16 18 18  '$ ALKPHOS 79 68 67  BILITOT 0.4 0.4 1.0  PROT 6.7 6.7 6.4*  ALBUMIN 4.3 3.4* 3.6   No results for input(s): "LIPASE", "AMYLASE" in the last 168 hours. No results for input(s): "AMMONIA" in the last 168 hours. Coagulation Profile: Recent Labs  Lab 05/28/22 1728  INR 1.1   BNP (last 3 results) No results for input(s): "PROBNP" in the  last 8760 hours. HbA1C: No results for input(s): "HGBA1C" in the last 72 hours. CBG: No results for input(s): "GLUCAP" in the last 168 hours. Lipid Profile: No results for input(s): "CHOL", "HDL", "LDLCALC", "TRIG", "CHOLHDL", "LDLDIRECT" in the last 72 hours. Thyroid Function Tests: No results for input(s): "TSH", "T4TOTAL", "FREET4", "T3FREE", "THYROIDAB" in the last 72 hours. Sepsis Labs: No results for input(s): "PROCALCITON", "LATICACIDVEN" in the last 168 hours.  No results found for this or any previous visit (from the past 240 hour(s)).  Antimicrobials: Anti-infectives (From admission, onward)    None      Culture/Microbiology    Component Value Date/Time   SDES  12/31/2021 1908    URINE, CLEAN CATCH Performed at [facility], [address], [place], [place] [address]    [facility]  12/31/2021 1908    NONE Performed at [facility], [address], [place], [place] [address]    [place]  12/31/2021 1908    NO GROWTH Performed at [facility] [address], [place], [place] [address]    REPTSTATUS 01/02/2022 FINAL 12/31/2021 1908   Other culture-see note  Radiology Studies: No results found.   LOS: 2 days   Geradine Girt, DO [facility]  06/01/2022, 11:39 AM

## 2022-06-01 NOTE — TOC Progression Note (Signed)
Transition of Care [facility]) - Progression Note    Patient Details  Name: WILLAMAE DEMBY MRN: [id number] Date of Birth: [birth year]  Transition of Care [facility]) CM/SW Contact  Leeroy Cha, RN Phone Number: 06/01/2022, 9:24 AM  Clinical Narrative:    [id number] chart reviewed.  Following for toc needs.  Plan is to return home with self-care currently.   Expected Discharge Plan: Home/Self Care Barriers to Discharge: Continued Medical Work up  Expected Discharge Plan and Services Expected Discharge Plan: Home/Self Care   Discharge Planning Services: CM Consult   Living arrangements for the past 2 months: Single Family Home                                       Social Determinants of Health (SDOH) Interventions    Readmission Risk Interventions     No data to display

## 2022-06-01 NOTE — Plan of Care (Signed)
  Problem: Education: Goal: Knowledge of General Education information will improve Description Including pain rating scale, medication(s)/side effects and non-pharmacologic comfort measures Outcome: Progressing   Problem: Health Behavior/Discharge Planning: Goal: Ability to manage health-related needs will improve Outcome: Progressing   

## 2022-06-01 NOTE — Progress Notes (Addendum)
Progress Note  Primary GI: Dr. Fuller Plan   Subjective  Chief Complaint:acute on chronic IDA, FOBT+  Patient's medical daughter is in the room with her. Patient is refusing to drink prep, drink half of prep yesterday that she was supposed to, has not started any today. Patient is alert but not cooperative, not responding to questions, will not open her eyes. Patient did best with an I asked if she wanted the colonoscopy and she said no. Patient did say "I want to die". Daughter is concerned with more confusion and could be possible UTI, has discussed with primary team. Does have Feraheme running Last bowel movement was yesterday and evening.   Objective   Vital signs in last 24 hours: Temp:  [97.8 F (36.6 C)] 97.8 F (36.6 C) (07/31 0440) Pulse Rate:  [74-79] 79 (07/31 0440) Resp:  [20] 20 (07/31 0440) BP: (140-145)/(68-76) 143/76 (07/31 0440) SpO2:  [97 %-98 %] 97 % (07/31 0440) Weight:  [80.2 kg] 80.2 kg (07/31 0500)   Last BM recorded by nurses in past 5 days Stool Type: Type 4 (Like a smooth, soft sausage or snake) (05/31/2022 10:00 PM)  General:   female in no acute distress  Heart:  Regular rate and rhythm; no murmurs Pulm: Clear anteriorly; no wheezing Abdomen:  Soft, Obese AB, Active bowel sounds. No tenderness . , No organomegaly appreciated. Extremities:  without  edema. Neurologic:  Alert and but not cooperative, not answering questions.  No focal deficits.  Psych: Depressed mood, uncooperative  Intake/Output from previous day: 07/30 0701 - 07/31 0700 In: 1042.3 [P.O.:590; I.V.:452.3] Out: 700 [Urine:700] Intake/Output this shift: No intake/output data recorded.  Studies/Results: No results found.  Lab Results: Recent Labs    05/30/22 1004 05/31/22 0405 06/01/22 0338  WBC 5.6 6.6 9.4  HGB 8.9* 8.7* 9.3*  HCT 29.3* 28.3* 31.2*  PLT 331 324 354   BMET Recent Labs    05/30/22 0700 06/01/22 0338  NA 140 142  K 3.3* 3.3*  CL 108 109  CO2 25 25   GLUCOSE 149* 89  BUN 9 6*  CREATININE 0.58 0.51  CALCIUM 9.2 9.2   LFT No results for input(s): "PROT", "ALBUMIN", "AST", "ALT", "ALKPHOS", "BILITOT", "BILIDIR", "IBILI" in the last 72 hours. PT/INR No results for input(s): "LABPROT", "INR" in the last 72 hours.    Patient profile:   81 year old female with recent stroke started on Plavix and aspirin in May 2023.   Admitted with acute on chronic iron deficiency anemia hemepositive stools.    iron deficiency is not new for her.  Concern would be a colorectal cancer or advanced lesion.  Needs a 2-day bowel prep and Plavix washout     Impression/Plan:   Acute on chronic IDA in setting of ASA/Plavix x May 2023 now on hold History of fair/less than ideal prep in 2015 when she had a 1 cm tubulovillous adenoma. Never came for recommended recall.   Upper endoscopy unremarkable while on Plavix.    CBC on 06/01/2022   WBC 9.4 HGB 9.3 MCV 91.2 Platelets 354 Anemia studies on 05/28/2022  Iron 18 Ferritin 7 B12 604- has received IV iron feraheme one dose --2-day bowel prep started yesterday however patient only completed half of that prep, last bowel movement was full formed stool last night. -Plavix on hold and complete washout Tuesday. -We will discuss with Dr. Candis Schatz but with patient's full formed stool and being uncooperative with plan on canceling colonoscopy for tomorrow, if patient is willing  to cooperate today tomorrow can tentatively schedule for Wednesday. -Continue to monitor CBC, transfuse less than 7 -Patient also very adamant that she would like to potentially discuss goals of care, does state "I want to die" can consider psych or hospice consult if UTI and medical work-up negative.  Uncooperative/confusion? Patient currently uncooperative with physical exam, unable to get good information. Refusing colonoscopy prep, is stating "I want to die". Daughter some concern with possible UTI, will let medical team evaluate  further Can consider psych versus hospice consult.  GERD  Continue chronic PPI  History of advanced polyps  Two polyps removed in 2015. One of the polyps was a TVA ( 1 cm). Prep was not very good. A three year follow up was recommended but not done  CVA May 2023 Plavix and ASA on hold   LOS: 2 days   Vladimir Crofts  06/01/2022, 8:14 AM  ------------------------------------------------------------------------------------------------------ I have taken a history, reviewed the chart and examined the patient. I performed a substantive portion of this encounter, including complete performance of at least one of the key components, in conjunction with the APP. I agree with the APP's note, impression and recommendations  The patient did agree to proceed with a colonoscopy, but still refuses the recommended 2 day bowel prep. I explained the rationale behind the recommended 2-day prep (previous standard prep was suboptimal, and bowel preps in the inpatient setting are generally lower quality than outpatient). Her primary objection to the bowel prep seems to be the taste of the bowel prep.  I told her that I would be okay with her using Crystal Light mix instead of water or Gatorade for her bowel prep. It would just need to be light in color (no red, blue or purple).  She was amenable to taking some Miralax this evening as well to provide a little extra prep.  Plan for colonoscopy Wednesday Clear liquid diet starting tomorrow, then NPO after midnight Tuesday night Bowel prep Tuesday evening, okay to use Crystal Light mixutre Continue to hold Plavix until after colonoscopy Ok to resume aspirin from GI standpoint given absence of overt bleeding, stable hgb and high stroke risk  Berna Gitto E. Candis Schatz, MD [facility]

## 2022-06-02 DIAGNOSIS — K922 Gastrointestinal hemorrhage, unspecified: Secondary | ICD-10-CM | POA: Diagnosis not present

## 2022-06-02 MED ORDER — ONDANSETRON HCL 4 MG/2ML IJ SOLN: 4.0000 mg | Freq: Four times a day (QID) | INTRAMUSCULAR | Status: DC | PRN

## 2022-06-02 MED ORDER — ONDANSETRON HCL 4 MG/2ML IJ SOLN
4.0000 mg | Freq: Four times a day (QID) | INTRAMUSCULAR | Status: DC | PRN
Start: 2022-06-02 — End: 2022-06-05
  Administered 2022-06-02: 4 mg via INTRAVENOUS
  Filled 2022-06-02: qty 2

## 2022-06-02 MED ORDER — PEG-KCL-NACL-NASULF-NA ASC-C 100 G PO SOLR
0.5000 | Freq: Once | ORAL | Status: AC
  Administered 2022-06-02: 100 g via ORAL
  Filled 2022-06-02: qty 1

## 2022-06-02 MED ORDER — SODIUM CHLORIDE 0.9 % IV SOLN: 510.0000 mg | Freq: Once | INTRAVENOUS | Status: DC

## 2022-06-02 MED ORDER — PEG-KCL-NACL-NASULF-NA ASC-C 100 G PO SOLR
0.5000 | Freq: Once | ORAL | Status: AC
  Administered 2022-06-03: 100 g via ORAL

## 2022-06-02 MED ORDER — ASPIRIN 81 MG PO TBEC
81.0000 mg | DELAYED_RELEASE_TABLET | Freq: Every day | ORAL | Status: DC
  Administered 2022-06-02 – 2022-06-04 (×3): 81 mg via ORAL
  Filled 2022-06-02 (×4): qty 1

## 2022-06-02 MED ORDER — SODIUM CHLORIDE 0.9 % IV SOLN
1.0000 g | INTRAVENOUS | Status: DC
  Administered 2022-06-02 – 2022-06-04 (×3): 1 g via INTRAVENOUS
  Filled 2022-06-02 (×4): qty 10

## 2022-06-02 MED ORDER — PEG-KCL-NACL-NASULF-NA ASC-C 100 G PO SOLR
1.0000 | Freq: Once | ORAL | Status: DC
Start: 2022-06-02 — End: 2022-06-02

## 2022-06-02 NOTE — Care Management Important Message (Signed)
Important Message  Patient Details IM Letter given to the Patient. Name: Jessica [name] MRN: [id number] Date of Birth: 10-26-1941   Medicare Important Message Given:  Yes     Kerin Salen 06/02/2022, 10:40 AM

## 2022-06-02 NOTE — Progress Notes (Addendum)
[facility] Progress Note  CC:  acute on chronic IDA, FOBT+  Subjective: She is fairly awake this afternoon.  She is tolerating clear liquids.  She denies having any nausea or vomiting.  No abdominal pain.  No bowel movement since Sunday or early Monday morning.  She is agreeable to start the colonoscopy bowel prep this afternoon as previously ordered, colonoscopy scheduled tomorrow.  Prior lack of cooperation/confusion likely due to UTI.  Daughter at the bedside.  Objective:  Vital signs in last 24 hours: Temp:  [97.8 F (36.6 C)-98.8 F (37.1 C)] 97.8 F (36.6 C) (08/01 1350) Pulse Rate:  [69-71] 69 (08/01 1350) Resp:  [18] 18 (08/01 1350) BP: (150)/(67-78) 150/78 (08/01 1350) SpO2:  [95 %-98 %] 95 % (08/01 1350) Weight:  [76.9 kg] 76.9 kg (08/01 0548)   General: Pale complected 81 year old female in no acute distress Heart: Irregular rhythm, no murmur. Pulm: Breath sounds clear with a few crackles in the right lower base. Chest: Right subclavian Port-A-Cath intact. Abdomen: Soft, nondistended.  Positive bowel sounds all 4 quadrants. Extremities:  Without edema. Neurologic:  Alert and  oriented x 4.  Speech is clear.  Moves all extremities. Psych:  Alert and cooperative. Normal mood and affect.  Intake/Output from previous day: 07/31 0701 - 08/01 0700 In: 594.9 [P.O.:240; I.V.:354.9] Out: 1000 [Urine:1000] Intake/Output this shift: Total I/O In: 240 [P.O.:240] Out: -   Lab Results: Recent Labs    05/31/22 0405 06/01/22 0338  WBC 6.6 9.4  HGB 8.7* 9.3*  HCT 28.3* 31.2*  PLT 324 354   BMET Recent Labs    06/01/22 0338  NA 142  K 3.3*  CL 109  CO2 25  GLUCOSE 89  BUN 6*  CREATININE 0.51  CALCIUM 9.2   LFT No results for input(s): "PROT", "ALBUMIN", "AST", "ALT", "ALKPHOS", "BILITOT", "BILIDIR", "IBILI" in the last 72 hours. PT/INR No results for input(s): "LABPROT", "INR" in the last 72 hours. Hepatitis Panel No results for input(s):  "HEPBSAG", "HCVAB", "HEPAIGM", "HEPBIGM" in the last 72 hours.  No results found.  Assessment / Plan:  91) 81 year old female admitted to the hospital 05/28/2022 with acute on chronic IDA in the setting of ASA/Plavix.  Admission hemoglobin 7.3 with a baseline hemoglobin 12-13.  Transfused 1 unit of PRBCs -> Hg 8.3 -> today Hg 9.3.  Iron 18.  Received Feraheme IV x1.  EGD 05/29/2022 showed a nonobstructing Schatzki's ring, 2 cm hiatal hernia, gastric diverticulum without bleeding and a normal duodenum. A colonoscopy was planned 7/31, however, she did not wish to drink the bowel prep and she decided not to pursue a colonoscopy. Since then, she agrees to proceed with a colonoscopy bowel prep today for a colonoscopy scheduled tomorrow.  -Transfuse for hemoglobin less than 8  -CBC in AM -Clear liquid diet -Bowel prep as previously ordered -NPO after midnight  -Proceed with colonoscopy tomorrow as scheduled -Await further recommendations per Dr. Candis Schatz  2) History of breast cancer s/p post radiation/chemotherapy 2018  3) History of CVA 03/2022 residual aphasia and right hemiparesis. On ASA.  Plavix on hold.   4) GERD -Continue Pantoprazole 40 mg IV twice daily  5) History of a 1cm tubulovillous adenomatous polyp per colonoscopy in 2015. -See plan in #1  6) UTI on Ceftriaxone.   Principal Problem:   Acute lower GI bleeding Active Problems:   Essential hypertension - well controlled   Dyslipidemia, goal LDL below 160   GERD (gastroesophageal reflux disease)   Acquired  hypothyroidism   Depression with anxiety   Hypokalemia   Acute blood loss anemia   Heme positive stool   Iron deficiency anemia due to chronic blood loss     LOS: 3 days   Noralyn Pick  06/02/2022, 2:20 PM  -----------------------------------------------------------------------------  I have taken a history, reviewed the chart and examined the patient. I performed a substantive portion of this  encounter, including complete performance of at least one of the key components, in conjunction with the APP. I agree with the APP's note, impression and recommendations  No additional recommendations.  Plan for colonoscopy tomorrow.   Will add Zofran PRN to help tolerate prep. If colonoscopy negative for source of blood loss, would consider pill camera.  This could potentially be done as outpatient given the absence of clinical bleeding during her admission  Adiyah Lame E. Candis Schatz, MD [facility]

## 2022-06-02 NOTE — Plan of Care (Signed)
  Problem: Clinical Measurements: Goal: Will remain free from infection Outcome: Progressing Goal: Diagnostic test results will improve Outcome: Progressing   Problem: Elimination: Goal: Will not experience complications related to bowel motility Outcome: Progressing Goal: Will not experience complications related to urinary retention Outcome: Progressing

## 2022-06-02 NOTE — Progress Notes (Signed)
I saw Ms. Ricciardelli this morning.  She is doing a little bit better.  I was called by her daughter yesterday saying that she has stopped eating.  She was not opening her eyes.  She wanted to die.  When I saw Ms. Pettaway this morning, and she seemed like she was pretty much her baseline.  She was talking to me.  She was lucid.  I realize that with her stroke that she had that she may have little bit of dysarthria.  However, I do not think this is really any worse than what she has had before.  I know she had the upper endoscopy.  I am not sure this really showed anything.  She is post to have a colonoscopy.  I think she will agree to have this done.  Her iron levels were incredibly low.  I think she got a dose of IV iron over the weekend.  I probably had to give her another dose of IV iron.  Again I do not think any of this is from her having breast cancer.  She is on aspirin and Plavix because of the stroke.  Yesterday, her hemoglobin was 9.3.  MCV was 91.  I realize that she is going need some rehab.  I will know if she is going be able to go home where she is going to have to go to rehab.  I think that for her own "sanity," she probably would do better at home.  I know she has a lot of support at home.  I realize that she does have emotional ups and downs.  I think the stroke certainly has made these more prevalent.  However, I know that she certainly is doing a good job and I know her family is trying their best.  I do appreciate the great care that she is getting from the wonderful staff on [address]  Lattie Haw, MD  1 Thessalonians 5:11

## 2022-06-02 NOTE — H&P (View-Only) (Signed)
[facility] Progress Note  CC:  acute on chronic IDA, FOBT+  Subjective: She is fairly awake this afternoon.  She is tolerating clear liquids.  She denies having any nausea or vomiting.  No abdominal pain.  No bowel movement since Sunday or early Monday morning.  She is agreeable to start the colonoscopy bowel prep this afternoon as previously ordered, colonoscopy scheduled tomorrow.  Prior lack of cooperation/confusion likely due to UTI.  Daughter at the bedside.  Objective:  Vital signs in last 24 hours: Temp:  [97.8 F (36.6 C)-98.8 F (37.1 C)] 97.8 F (36.6 C) (08/01 1350) Pulse Rate:  [69-71] 69 (08/01 1350) Resp:  [18] 18 (08/01 1350) BP: (150)/(67-78) 150/78 (08/01 1350) SpO2:  [95 %-98 %] 95 % (08/01 1350) Weight:  [76.9 kg] 76.9 kg (08/01 0548)   General: Pale complected 81 year old female in no acute distress Heart: Irregular rhythm, no murmur. Pulm: Breath sounds clear with a few crackles in the right lower base. Chest: Right subclavian Port-A-Cath intact. Abdomen: Soft, nondistended.  Positive bowel sounds all 4 quadrants. Extremities:  Without edema. Neurologic:  Alert and  oriented x 4.  Speech is clear.  Moves all extremities. Psych:  Alert and cooperative. Normal mood and affect.  Intake/Output from previous day: 07/31 0701 - 08/01 0700 In: 594.9 [P.O.:240; I.V.:354.9] Out: 1000 [Urine:1000] Intake/Output this shift: Total I/O In: 240 [P.O.:240] Out: -   Lab Results: Recent Labs    05/31/22 0405 06/01/22 0338  WBC 6.6 9.4  HGB 8.7* 9.3*  HCT 28.3* 31.2*  PLT 324 354   BMET Recent Labs    06/01/22 0338  NA 142  K 3.3*  CL 109  CO2 25  GLUCOSE 89  BUN 6*  CREATININE 0.51  CALCIUM 9.2   LFT No results for input(s): "PROT", "ALBUMIN", "AST", "ALT", "ALKPHOS", "BILITOT", "BILIDIR", "IBILI" in the last 72 hours. PT/INR No results for input(s): "LABPROT", "INR" in the last 72 hours. Hepatitis Panel No results for input(s):  "HEPBSAG", "HCVAB", "HEPAIGM", "HEPBIGM" in the last 72 hours.  No results found.  Assessment / Plan:  54) 81 year old female admitted to the hospital 05/28/2022 with acute on chronic IDA in the setting of ASA/Plavix.  Admission hemoglobin 7.3 with a baseline hemoglobin 12-13.  Transfused 1 unit of PRBCs -> Hg 8.3 -> today Hg 9.3.  Iron 18.  Received Feraheme IV x1.  EGD 05/29/2022 showed a nonobstructing Schatzki's ring, 2 cm hiatal hernia, gastric diverticulum without bleeding and a normal duodenum. A colonoscopy was planned 7/31, however, she did not wish to drink the bowel prep and she decided not to pursue a colonoscopy. Since then, she agrees to proceed with a colonoscopy bowel prep today for a colonoscopy scheduled tomorrow.  -Transfuse for hemoglobin less than 8  -CBC in AM -Clear liquid diet -Bowel prep as previously ordered -NPO after midnight  -Proceed with colonoscopy tomorrow as scheduled -Await further recommendations per Dr. Candis Schatz  2) History of breast cancer s/p post radiation/chemotherapy 2018  3) History of CVA 03/2022 residual aphasia and right hemiparesis. On ASA.  Plavix on hold.   4) GERD -Continue Pantoprazole 40 mg IV twice daily  5) History of a 1cm tubulovillous adenomatous polyp per colonoscopy in 2015. -See plan in #1  6) UTI on Ceftriaxone.   Principal Problem:   Acute lower GI bleeding Active Problems:   Essential hypertension - well controlled   Dyslipidemia, goal LDL below 160   GERD (gastroesophageal reflux disease)   Acquired  hypothyroidism   Depression with anxiety   Hypokalemia   Acute blood loss anemia   Heme positive stool   Iron deficiency anemia due to chronic blood loss     LOS: 3 days   Noralyn Pick  06/02/2022, 2:20 PM  -----------------------------------------------------------------------------  I have taken a history, reviewed the chart and examined the patient. I performed a substantive portion of this  encounter, including complete performance of at least one of the key components, in conjunction with the APP. I agree with the APP's note, impression and recommendations  No additional recommendations.  Plan for colonoscopy tomorrow.   Will add Zofran PRN to help tolerate prep. If colonoscopy negative for source of blood loss, would consider pill camera.  This could potentially be done as outpatient given the absence of clinical bleeding during her admission  Keegan Ducey E. Candis Schatz, MD [facility]

## 2022-06-02 NOTE — Progress Notes (Signed)
PROGRESS NOTE Jessica [name]  ZOX:[id number] DOB: Jan 25, 1941 DOA: 05/28/2022 PCP: [name], MD   Brief Narrative/Hospital Course: 44 F with past medical history of left breast invasive ductal carcinoma triple negative, history of staph wound  infection of the left axilla/cellulitis of the left breast followed by Dr. Marin Olp S/P cycle #4 completed adjuvant therapy 09/09/2017, history of stroke back in May with residual expressive aphasia and right hemiparesisplaced on aspirin and Plavix, HTN, HLD, GERD having dark stool past week and a half and blood work showed anemia, Hemoccult positive GI was consulted 1 unit PRBC transfused and patient was admitted for further management . 7/28-underwent EGD-Schatzki ring, hiatal hernia gastric diverticulum without bleeding-advised colonoscopy on Wednesday after Plavix washout, continue clear liquid diet.       Subjective: Agreeable for colonoscopy-- says sometimes she just says things that are crazy  Assessment and Plan: Principal Problem:   Acute lower GI bleeding Active Problems:   Essential hypertension - well controlled   Dyslipidemia, goal LDL below 160   GERD (gastroesophageal reflux disease)   Acquired hypothyroidism   Depression with anxiety   Hypokalemia   Acute blood loss anemia   Heme positive stool   Iron deficiency anemia due to chronic blood loss    Acute blood loss anemia Hemoccult positive stool/dark stool Suspected GI bleeding: Patient on aspirin Plavix for acute CVA in May,now with acute blood loss anemia, Hemoccult positive dark stool suspecting GI bleeding. Gi on board-7/28-underwent EGD-Schatzki ring, hiatal hernia gastric diverticulum without bleeding-advised colonoscopy on Tuesday after Plavix washout, continue clear liquid diet until Monday, cont ppi bid, serial h/h  s/p ferraheme iv.  Hemoglobin remains stable-monitor  -ok to restart ASA  Non-compliance with treatment- now more cooperative -r/o UTI- culture  pending-- started rocephin -gentle IVF until eating better -delirium precautions  Essential hypertension: Borderline controlled,resume amlodipine. Cont to hold hctz and reasses.  Dyslipidemia: cont Lipitor GERD-cont ppi Acquired hypothyroidism: cont Synthroid Depression with anxiety: resume home Wellbutrin, Neurontin Effexor Hypokalemia:will replete po Class I Obesity:Patient's Body mass index is 30.03 kg/m. : Will benefit with PCP follow-up, weight loss  healthy lifestyle and outpatient follow-up  DVT prophylaxis: SCDs Start: 05/28/22 2028 Code Status:   Code Status: DNR Family Communication: called daughter Patient status is: Inpatient because of work-up of blood loss anemia Level of care: change to med surge  Dispo: The patient is from: Home with daughter            Anticipated disposition: Home with daughter after colonoscopy   Objective: Vitals last 24 hrs: Vitals:   06/01/22 0500 06/01/22 1207 06/01/22 2045 06/02/22 0548  BP:  (!) 178/74 (!) 150/67   Pulse:  72 71   Resp:  18    Temp:  98.2 F (36.8 C) 98.8 F (37.1 C)   TempSrc:  Oral    SpO2:  93% 98%   Weight: 80.2 kg   76.9 kg  Height:        Physical Examination:   General: Appearance:    Obese female in no acute distress     Lungs:     respirations unlabored  Heart:    Normal heart rate.   MS:   All extremities are intact.   Neurologic:   Awake, alert. Speech slightly slowed          Medications reviewed:  Scheduled Meds:  amLODipine  10 mg Oral Daily   aspirin EC  81 mg Oral Daily   atorvastatin  80 mg Oral Daily  buPROPion  150 mg Oral Daily   Chlorhexidine Gluconate Cloth  6 each Topical Daily   cholecalciferol  1,000 Units Oral Daily   folic acid  2 mg Oral Daily   gabapentin  100 mg Oral QHS   levothyroxine  75 mcg Oral Q0600   pantoprazole (PROTONIX) IV  40 mg Intravenous Q12H   potassium chloride  40 mEq Oral Once   sodium chloride flush  10-40 mL Intracatheter Q12H   venlafaxine  XR  225 mg Oral Daily   Continuous Infusions:  sodium chloride 50 mL/hr at 06/02/22 0936   cefTRIAXone (ROCEPHIN)  IV 1 g (06/02/22 4128)      Diet Order             Diet clear liquid Room service appropriate? Yes; Fluid consistency: Thin  Diet effective now                  Intake/Output Summary (Last 24 hours) at 06/02/2022 1102 Last data filed at 06/02/2022 0550 Gross per 24 hour  Intake 529.57 ml  Output 1000 ml  Net -470.43 ml   Net IO Since Admission: -253.03 mL [06/02/22 1102]  Wt Readings from Last 3 Encounters:  06/02/22 76.9 kg  05/28/22 78.1 kg  05/26/22 78.1 kg     Unresulted Labs (From admission, onward)     Start     Ordered   06/02/22 0716  Urine Culture  (Urine Culture)  Add-on,   AD       Question:  Indication  Answer:  Altered mental status (if no other cause identified)   06/02/22 0715          Data Reviewed: I have personally reviewed following labs and imaging studies CBC: Recent Labs  Lab 05/28/22 1330 05/28/22 1330 05/28/22 1728 05/29/22 0149 05/29/22 0850 05/30/22 1004 05/31/22 0405 06/01/22 0338  WBC 6.6  --  6.1 6.6  --  5.6 6.6 9.4  NEUTROABS 4.2  --  3.8 3.9  --   --   --   --   HGB 7.3*   < > 7.1* 8.3* 8.6* 8.9* 8.7* 9.3*  HCT 23.8*  --  22.8* 26.3* 27.2* 29.3* 28.3* 31.2*  MCV 94.1  --  93.8 86.2  --  89.9 90.4 91.2  PLT 364  --  336 321  --  331 324 354   < > = values in this interval not displayed.   Basic Metabolic Panel: Recent Labs  Lab 05/28/22 1330 05/28/22 1728 05/29/22 0149 05/30/22 0700 06/01/22 0338  NA 140 142 143 140 142  K 3.5 3.2* 3.7 3.3* 3.3*  CL 100 105 105 108 109  CO2 33* '30 28 25 25  '$ GLUCOSE 100* 109* 92 149* 89  BUN '17 17 15 9 '$ 6*  CREATININE 0.67 0.60 0.54 0.58 0.51  CALCIUM 9.4 9.2 8.8* 9.2 9.2  MG  --   --  2.0  --   --    GFR: Estimated Creatinine Clearance: 55.1 mL/min (by C-G formula based on SCr of 0.51 mg/dL). Liver Function Tests: Recent Labs  Lab 05/28/22 1330 05/28/22 1728  05/29/22 0149  AST '16 17 18  '$ ALT '16 18 18  '$ ALKPHOS 79 68 67  BILITOT 0.4 0.4 1.0  PROT 6.7 6.7 6.4*  ALBUMIN 4.3 3.4* 3.6   No results for input(s): "LIPASE", "AMYLASE" in the last 168 hours. No results for input(s): "AMMONIA" in the last 168 hours. Coagulation Profile: Recent Labs  Lab 05/28/22 1728  INR  1.1     No results found for this or any previous visit (from the past 240 hour(s)).  Antimicrobials: Anti-infectives (From admission, onward)    Start     Dose/Rate Route Frequency Ordered Stop   06/02/22 1000  cefTRIAXone (ROCEPHIN) 1 g in sodium chloride 0.9 % 100 mL IVPB       Note to Pharmacy: Start after urine culture sent   1 g 200 mL/hr over 30 Minutes Intravenous Every 24 hours 06/02/22 0715           Radiology Studies: No results found.   LOS: 3 days   Geradine Girt, DO [facility]  06/02/2022, 11:02 AM

## 2022-06-03 ENCOUNTER — Encounter (HOSPITAL_COMMUNITY): Payer: Self-pay | Admitting: Internal Medicine

## 2022-06-03 ENCOUNTER — Encounter (HOSPITAL_COMMUNITY)
Admission: EM | Disposition: A | Discharge status: Home / Self Care | Payer: Self-pay | Source: Home / Self Care | Attending: Internal Medicine

## 2022-06-03 ENCOUNTER — Inpatient Hospital Stay (HOSPITAL_COMMUNITY): Payer: Medicare Other | Admitting: Anesthesiology

## 2022-06-03 DIAGNOSIS — K635 Polyp of colon: Secondary | ICD-10-CM

## 2022-06-03 DIAGNOSIS — D62 Acute posthemorrhagic anemia: Secondary | ICD-10-CM | POA: Diagnosis not present

## 2022-06-03 DIAGNOSIS — D649 Anemia, unspecified: Secondary | ICD-10-CM

## 2022-06-03 DIAGNOSIS — D122 Benign neoplasm of ascending colon: Secondary | ICD-10-CM

## 2022-06-03 DIAGNOSIS — K922 Gastrointestinal hemorrhage, unspecified: Secondary | ICD-10-CM | POA: Diagnosis not present

## 2022-06-03 DIAGNOSIS — G4730 Sleep apnea, unspecified: Secondary | ICD-10-CM

## 2022-06-03 DIAGNOSIS — D509 Iron deficiency anemia, unspecified: Secondary | ICD-10-CM

## 2022-06-03 DIAGNOSIS — E039 Hypothyroidism, unspecified: Secondary | ICD-10-CM | POA: Diagnosis not present

## 2022-06-03 DIAGNOSIS — I10 Essential (primary) hypertension: Secondary | ICD-10-CM

## 2022-06-03 HISTORY — PX: POLYPECTOMY: SHX5525

## 2022-06-03 HISTORY — PX: COLONOSCOPY WITH PROPOFOL: SHX5780

## 2022-06-03 LAB — BASIC METABOLIC PANEL
Anion gap: 9 (ref 5–15)
BUN: 7 mg/dL — ABNORMAL LOW (ref 8–23)
CO2: 26 mmol/L (ref 22–32)
Calcium: 9.3 mg/dL (ref 8.9–10.3)
Chloride: 108 mmol/L (ref 98–111)
Creatinine, Ser: 0.63 mg/dL (ref 0.44–1.00)
GFR, Estimated: >60 mL/min (ref >60)
Glucose, Bld: 115 mg/dL — ABNORMAL HIGH (ref 70–99)
Potassium: 3.4 mmol/L — ABNORMAL LOW (ref 3.5–5.1)
Sodium: 143 mmol/L (ref 135–145)

## 2022-06-03 LAB — CBC
HCT: 33.4 % — ABNORMAL LOW (ref 36.0–46.0)
Hemoglobin: 10.1 g/dL — ABNORMAL LOW (ref 12.0–15.0)
MCH: 27.8 pg (ref 26.0–34.0)
MCHC: 30.2 g/dL (ref 30.0–36.0)
MCV: 92 fL (ref 80.0–100.0)
Platelets: 360 10*3/uL (ref 150–400)
RBC: 3.63 MIL/uL — ABNORMAL LOW (ref 3.87–5.11)
RDW: 19.2 % — ABNORMAL HIGH (ref 11.5–15.5)
WBC: 7.8 10*3/uL (ref 4.0–10.5)
nRBC: 0 % (ref 0.0–0.2)

## 2022-06-03 SURGERY — COLONOSCOPY WITH PROPOFOL
Anesthesia: Monitor Anesthesia Care

## 2022-06-03 MED ORDER — LACTATED RINGERS IV SOLN: INTRAVENOUS | Status: DC | PRN

## 2022-06-03 MED ORDER — SODIUM CHLORIDE 0.9 % IV SOLN
INTRAVENOUS | Status: DC
Start: 2022-06-03 — End: 2022-06-03

## 2022-06-03 MED ORDER — ALUM & MAG HYDROXIDE-SIMETH 200-200-20 MG/5ML PO SUSP
30.0000 mL | ORAL | Status: DC | PRN
  Administered 2022-06-03: 30 mL via ORAL
  Filled 2022-06-03: qty 30

## 2022-06-03 MED ORDER — POTASSIUM CHLORIDE 10 MEQ/100ML IV SOLN
10.0000 meq | INTRAVENOUS | Status: AC
  Administered 2022-06-03 (×2): 10 meq via INTRAVENOUS
  Filled 2022-06-03 (×2): qty 100

## 2022-06-03 MED ORDER — POTASSIUM CHLORIDE CRYS ER 10 MEQ PO TBCR: 50.0000 meq | EXTENDED_RELEASE_TABLET | Freq: Once | ORAL | Status: DC

## 2022-06-03 MED ORDER — PROPOFOL 500 MG/50ML IV EMUL
INTRAVENOUS | Status: DC | PRN
  Administered 2022-06-03: 85 ug/kg/min via INTRAVENOUS

## 2022-06-03 MED ORDER — POTASSIUM CHLORIDE CRYS ER 10 MEQ PO TBCR
30.0000 meq | EXTENDED_RELEASE_TABLET | Freq: Once | ORAL | Status: AC
  Administered 2022-06-03: 30 meq via ORAL
  Filled 2022-06-03: qty 1

## 2022-06-03 MED ORDER — PEG-KCL-NACL-NASULF-NA ASC-C 100 G PO SOLR
0.5000 | Freq: Once | ORAL | Status: AC
Start: 2022-06-03 — End: 2022-06-03
  Administered 2022-06-03: 100 g via ORAL
  Filled 2022-06-03: qty 1

## 2022-06-03 SURGICAL SUPPLY — 22 items

## 2022-06-03 NOTE — Progress Notes (Signed)
PROGRESS NOTE    Jessica [name]  ZHY:[id number] DOB: [birth year] DOA: 05/28/2022 PCP: [name], MD     Brief Narrative:  49 WF PMHx  left breast invasive ductal carcinoma triple negative, history of staph wound  infection of the left axilla/cellulitis of the left breast followed by Dr. Marin Olp S/P cycle #4 completed adjuvant therapy 09/09/2017, Hx  CVA in May with residual expressive aphasia and right hemiparesis placed on aspirin and Plavix, HTN, HLD, GERD   Presented having dark stool past week and a half and blood work showed anemia, Hemoccult positive GI was consulted 1 unit PRBC transfused and patient was admitted for further management . 7/28-underwent EGD-Schatzki ring, hiatal hernia gastric diverticulum without bleeding-advised colonoscopy on Wednesday after Plavix washout, continue clear liquid diet.         Subjective: Sleepy (just returned from procedure), arousable A/O x4.   Assessment & Plan: Covid vaccination;   Principal Problem:   Acute lower GI bleeding Active Problems:   Essential hypertension - well controlled   Dyslipidemia, goal LDL below 160   GERD (gastroesophageal reflux disease)   Acquired hypothyroidism   Depression with anxiety   Hypokalemia   Acute blood loss anemia   Heme positive stool   Iron deficiency anemia due to chronic blood loss   Acute blood loss anemia Lab Results  Component Value Date   HGB 10.1 (L) 06/03/2022   HGB 9.3 (L) 06/01/2022   HGB 8.7 (L) 05/31/2022   HGB 8.9 (L) 05/30/2022   HGB 8.6 (L) 05/29/2022  -Stable -8/2 s/p colonoscopy  Suspected GI bleeding: Patient on aspirin Plavix for acute CVA in May,now with acute blood loss anemia, Hemoccult positive dark stool suspecting GI bleeding. Gi on board-7/28-underwent EGD-Schatzki ring, hiatal hernia gastric diverticulum without bleeding-advised colonoscopy on Tuesday after Plavix washout, continue clear liquid diet until Monday, cont ppi bid, serial h/h  s/p ferraheme  iv.  Hemoglobin remains stable-monitor  -ok to restart ASA   Non-compliance with treatment- now more cooperative -r/o UTI- culture pending-- started rocephin -gentle IVF until eating better -delirium precautions  Essential HTN -Borderline controlled,resume amlodipine. Cont to hold hctz and reasses.   Dyslipidemia: cont Lipitor  GERD-cont ppi  Acquired hypothyroidism:  -Cont Synthroid  Depression with anxiety: resume home Wellbutrin, Neurontin Effexor  Hypokalemia: -Potassium goal>4 -8/2 Potassium IV 50 mEq  Class I Obesity:Patient's Body mass index is 30.03 kg/m. : Will benefit with PCP follow-up, weight loss  healthy lifestyle and outpatient follow-up            Mobility Assessment (last 72 hours)     Mobility Assessment     Row Name 06/02/22 2045 06/01/22 0738 05/31/22 2200 05/31/22 0820     Does patient have an order for bedrest or is patient medically unstable No - Continue assessment No - Continue assessment No - Continue assessment No - Continue assessment    What is the highest level of mobility based on the progressive mobility assessment? Level 5 (Walks with assist in room/hall) - Balance while stepping forward/back and can walk in room with assist - Complete Level 5 (Walks with assist in room/hall) - Balance while stepping forward/back and can walk in room with assist - Complete Level 5 (Walks with assist in room/hall) - Balance while stepping forward/back and can walk in room with assist - Complete Level 5 (Walks with assist in room/hall) - Balance while stepping forward/back and can walk in room with assist - Complete  DVT prophylaxis: SCD Code Status: DNR Family Communication: 8/2 wife at bedside for discussion of plan of care all questions answered  Status is: Inpatient    Dispo: The patient is from: Home              Anticipated d/c is to: Home              Anticipated d/c date is: 2 days              Patient  currently is not medically stable to d/c.      Consultants:  GI  Procedures/Significant Events:  8/2 colonoscopy  I have personally reviewed and interpreted all radiology studies and my findings are as above.  VENTILATOR SETTINGS:    Cultures   Antimicrobials:    Devices    LINES / TUBES:      Continuous Infusions:  sodium chloride Stopped (06/02/22 0942)   cefTRIAXone (ROCEPHIN)  IV Stopped (06/02/22 1012)     Objective: Vitals:   06/02/22 0548 06/02/22 1350 06/02/22 2146 06/03/22 0500  BP:  (!) 150/78 (!) 153/70   Pulse:  69 71   Resp:  18 18   Temp:  97.8 F (36.6 C) 98.6 F (37 C)   TempSrc:  Oral Oral   SpO2:  95% 94%   Weight: 76.9 kg   79.8 kg  Height:        Intake/Output Summary (Last 24 hours) at 06/03/2022 9485 Last data filed at 06/03/2022 4627 Gross per 24 hour  Intake 537.39 ml  Output 800 ml  Net -262.61 ml   Filed Weights   06/01/22 0500 06/02/22 0548 06/03/22 0500  Weight: 80.2 kg 76.9 kg 79.8 kg    Examination:  General: Sleepy but arousable, A/O x4, No acute respiratory distress Eyes: negative scleral hemorrhage, negative anisocoria, negative icterus ENT: Negative Runny nose, negative gingival bleeding, Neck:  Negative scars, masses, torticollis, lymphadenopathy, JVD Lungs: Clear to auscultation bilaterally without wheezes or crackles Cardiovascular: Regular rate and rhythm without murmur gallop or rub normal S1 and S2 Abdomen: Mild abdominal pain to palpation, nondistended, positive soft, bowel sounds, no rebound, no ascites, no appreciable mass Extremities: No significant cyanosis, clubbing, or edema bilateral lower extremities Skin: Negative rashes, lesions, ulcers Psychiatric:  Negative depression, negative anxiety, negative fatigue, negative mania  Central nervous system:  Cranial nerves II through XII intact, tongue/uvula midline, all extremities muscle strength 5/5, sensation intact throughout, negative dysarthria,  negative expressive aphasia, negative receptive aphasia.  .     Data Reviewed: Care during the described time interval was provided by me .  I have reviewed this patient's available data, including medical history, events of note, physical examination, and all test results as part of my evaluation.  CBC: Recent Labs  Lab 05/28/22 1330 05/28/22 1330 05/28/22 1728 05/29/22 0149 05/29/22 0850 05/30/22 1004 05/31/22 0405 06/01/22 0338 06/03/22 0440  WBC 6.6   < > 6.1 6.6  --  5.6 6.6 9.4 7.8  NEUTROABS 4.2  --  3.8 3.9  --   --   --   --   --   HGB 7.3*   < > 7.1* 8.3* 8.6* 8.9* 8.7* 9.3* 10.1*  HCT 23.8*  --  22.8* 26.3* 27.2* 29.3* 28.3* 31.2* 33.4*  MCV 94.1  --  93.8 86.2  --  89.9 90.4 91.2 92.0  PLT 364   < > 336 321  --  331 324 354 360   < > = values in this interval  not displayed.   Basic Metabolic Panel: Recent Labs  Lab 05/28/22 1728 05/29/22 0149 05/30/22 0700 06/01/22 0338 06/03/22 0440  NA 142 143 140 142 143  K 3.2* 3.7 3.3* 3.3* 3.4*  CL 105 105 108 109 108  CO2 '30 28 25 25 26  '$ GLUCOSE 109* 92 149* 89 115*  BUN '17 15 9 '$ 6* 7*  CREATININE 0.60 0.54 0.58 0.51 0.63  CALCIUM 9.2 8.8* 9.2 9.2 9.3  MG  --  2.0  --   --   --    GFR: Estimated Creatinine Clearance: 56.1 mL/min (by C-G formula based on SCr of 0.63 mg/dL). Liver Function Tests: Recent Labs  Lab 05/28/22 1330 05/28/22 1728 05/29/22 0149  AST '16 17 18  '$ ALT '16 18 18  '$ ALKPHOS 79 68 67  BILITOT 0.4 0.4 1.0  PROT 6.7 6.7 6.4*  ALBUMIN 4.3 3.4* 3.6   No results for input(s): "LIPASE", "AMYLASE" in the last 168 hours. No results for input(s): "AMMONIA" in the last 168 hours. Coagulation Profile: Recent Labs  Lab 05/28/22 1728  INR 1.1   Cardiac Enzymes: No results for input(s): "CKTOTAL", "CKMB", "CKMBINDEX", "TROPONINI" in the last 168 hours. BNP (last 3 results) No results for input(s): "PROBNP" in the last 8760 hours. HbA1C: No results for input(s): "HGBA1C" in the last 72  hours. CBG: No results for input(s): "GLUCAP" in the last 168 hours. Lipid Profile: No results for input(s): "CHOL", "HDL", "LDLCALC", "TRIG", "CHOLHDL", "LDLDIRECT" in the last 72 hours. Thyroid Function Tests: No results for input(s): "TSH", "T4TOTAL", "FREET4", "T3FREE", "THYROIDAB" in the last 72 hours. Anemia Panel: No results for input(s): "VITAMINB12", "FOLATE", "FERRITIN", "TIBC", "IRON", "RETICCTPCT" in the last 72 hours. Sepsis Labs: No results for input(s): "PROCALCITON", "LATICACIDVEN" in the last 168 hours.  No results found for this or any previous visit (from the past 240 hour(s)).       Radiology Studies: No results found.      Scheduled Meds:  amLODipine  10 mg Oral Daily   aspirin EC  81 mg Oral Daily   atorvastatin  80 mg Oral Daily   buPROPion  150 mg Oral Daily   Chlorhexidine Gluconate Cloth  6 each Topical Daily   cholecalciferol  1,000 Units Oral Daily   folic acid  2 mg Oral Daily   gabapentin  100 mg Oral QHS   levothyroxine  75 mcg Oral Q0600   pantoprazole (PROTONIX) IV  40 mg Intravenous Q12H   potassium chloride  40 mEq Oral Once   sodium chloride flush  10-40 mL Intracatheter Q12H   venlafaxine XR  225 mg Oral Daily   Continuous Infusions:  sodium chloride Stopped (06/02/22 0942)   cefTRIAXone (ROCEPHIN)  IV Stopped (06/02/22 1012)     LOS: 4 days    Time spent:40 min    Lazette Estala, Geraldo Docker, MD [facility]   If 7PM-7AM, please contact night-coverage 06/03/2022, 7:22 AM

## 2022-06-03 NOTE — Op Note (Signed)
[facility] Patient Name: Jessica [name] Procedure Date: 06/03/2022 MRN: [id number] Attending MD: Gladstone Pih. Candis Schatz , MD Date of Birth: April 23, 1941 CSN: [id number] Age: 81 Admit Type: Inpatient Procedure:                Colonoscopy Indications:              Iron deficiency anemia without overt GI bleeding. Providers:                Gladstone Pih. Candis Schatz, MD, Ladoris Gene, RN,                            Fransico Setters Mbumina, Technician Referring MD:              Medicines:                Monitored Anesthesia Care Complications:            No immediate complications. Estimated Blood Loss:     Estimated blood loss was minimal. Procedure:                Pre-Anesthesia Assessment:                           - Prior to the procedure, a History and Physical                            was performed, and patient medications and                            allergies were reviewed. The patient's tolerance of                            previous anesthesia was also reviewed. The risks                            and benefits of the procedure and the sedation                            options and risks were discussed with the patient.                            All questions were answered, and informed consent                            was obtained. Prior Anticoagulants: The patient has                            taken Plavix (clopidogrel), last dose was 6 days                            prior to procedure. ASA Grade Assessment: III - A                            patient with severe systemic disease. After  reviewing the risks and benefits, the patient was                            deemed in satisfactory condition to undergo the                            procedure.                           After obtaining informed consent, the colonoscope                            was passed under direct vision. Throughout the                            procedure, the  patient's blood pressure, pulse, and                            oxygen saturations were monitored continuously. The                            CF-HQ190L ([id number] Olympus colonoscope was                            introduced through the anus and advanced to the the                            terminal ileum, with identification of the                            ileocecal valve. The colonoscopy was unusually                            difficult due to poor endoscopic visualization,                            significant looping and a tortuous colon.                            Successful completion of the procedure was aided by                            using manual pressure and withdrawing the scope and                            replacing with the pediatric endoscope. The patient                            tolerated the procedure well. The quality of the                            bowel preparation was inadequate. The terminal  ileum, the ileocecal valve and the rectum were                            photographed. The preparation in the cecum was poor                            and was not able to be adequatlely cleansed and                            visualized due to repeated occlusion of the suction                            channel. The appendiceal orifice was not                            definitively identified. The bowel preparation used                            was MoviPrep via split dose instruction. Scope In: 1:20:23 PM Scope Out: 2:12:37 PM Scope Withdrawal Time: 0 hours 30 minutes 41 seconds  Total Procedure Duration: 0 hours 52 minutes 14 seconds  Findings:      The perianal and digital rectal examinations were normal. Pertinent       negatives include normal sphincter tone and no palpable rectal lesions.      Two sessile polyps were found in the ascending colon. The polyps were 3       to 6 mm in size. These polyps were removed with a cold  snare. Resection       was complete, but the polyp tissue was only partially retrieved.       Estimated blood loss was minimal.      The exam was otherwise normal throughout the examined colon.      The terminal ileum appeared normal.      The retroflexed view of the distal rectum and anal verge was normal and       showed no anal or rectal abnormalities. Impression:               - Preparation of the colon was inadequate.                           - Two 3 to 6 mm polyps in the ascending colon,                            removed with a cold snare. Complete resection.                            Partial retrieval.                           - The examined portion of the ileum was normal.                           - The distal rectum and anal verge are normal on  retroflexion view.                           - No endoscopic abnormalities to explain iron                            deficiency anemia Moderate Sedation:      Not Applicable - Patient had care per Anesthesia. Recommendation:           - Return patient to hospital ward for possible                            discharge same day.                           - Resume previous diet.                           - Resume Plavix (clopidogrel) at prior dose in 2                            days.                           - Await pathology results.                           - Repeat CBC in 2 weeks as outpatient.                           - Consider capsule endoscopy for evaulation of iron                            deficiency vs repeat colonoscopy to definitively                            examine the cecum                           - Recommend patient take oral iron supplements as                            outpatient. Procedure Code(s):        --- Professional ---                           [phone], Colonoscopy, flexible; with removal of                            tumor(s), polyp(s), or other lesion(s) by snare                             technique Diagnosis Code(s):        --- Professional ---                           K63.5, Polyp of colon  D50.9, Iron deficiency anemia, unspecified CPT copyright 2019 [employer]. All rights reserved. The codes documented in this report are preliminary and upon coder review may  be revised to meet current compliance requirements. Lynnea Vandervoort E. Candis Schatz, MD 06/03/2022 2:28:20 PM This report has been signed electronically. Number of Addenda: 0

## 2022-06-03 NOTE — Transfer of Care (Signed)
Immediate Anesthesia Transfer of Care Note  Patient: VELECIA OVITT  Procedure(s) Performed: COLONOSCOPY WITH PROPOFOL POLYPECTOMY  Patient Location: PACU and Endoscopy Unit  Anesthesia Type:MAC  Level of Consciousness: awake and alert   Airway & Oxygen Therapy: Patient Spontanous Breathing and Patient connected to nasal cannula oxygen  Post-op Assessment: Report given to RN and Post -op Vital signs reviewed and stable  Post vital signs: Reviewed and stable  Last Vitals:  Vitals Value Taken Time  BP 192/77 06/03/22 1417  Temp    Pulse 87 06/03/22 1419  Resp 13 06/03/22 1419  SpO2 95 % 06/03/22 1419  Vitals shown include unvalidated device data.  Last Pain:  Vitals:   06/03/22 1317  TempSrc:   PainSc: 0-No pain      Patients Stated Pain Goal: 0 (20/10/07 1219)  Complications: No notable events documented.

## 2022-06-03 NOTE — Anesthesia Preprocedure Evaluation (Signed)
Anesthesia Evaluation  Patient identified by MRN, date of birth, ID band Patient awake    Reviewed: Allergy & Precautions, NPO status , Patient's Chart, lab work & pertinent test results  Airway Mallampati: II  TM Distance: >3 FB Neck ROM: Full    Dental no notable dental hx.    Pulmonary sleep apnea ,    Pulmonary exam normal breath sounds clear to auscultation       Cardiovascular hypertension, Normal cardiovascular exam Rhythm:Regular Rate:Normal     Neuro/Psych CVA, Residual Symptoms negative psych ROS   GI/Hepatic negative GI ROS, Neg liver ROS,   Endo/Other  Hypothyroidism   Renal/GU negative Renal ROS  negative genitourinary   Musculoskeletal negative musculoskeletal ROS (+)   Abdominal   Peds negative pediatric ROS (+)  Hematology  (+) Blood dyscrasia, anemia ,   Anesthesia Other Findings   Reproductive/Obstetrics negative OB ROS                             Anesthesia Physical Anesthesia Plan  ASA: 3  Anesthesia Plan: MAC   Post-op Pain Management:    Induction: Intravenous  PONV Risk Score and Plan: 2 and Propofol infusion and Treatment may vary due to age or medical condition  Airway Management Planned: Simple Face Mask  Additional Equipment:   Intra-op Plan:   Post-operative Plan:   Informed Consent: I have reviewed the patients History and Physical, chart, labs and discussed the procedure including the risks, benefits and alternatives for the proposed anesthesia with the patient or authorized representative who has indicated his/her understanding and acceptance.     Dental advisory given  Plan Discussed with: CRNA and Surgeon  Anesthesia Plan Comments:         Anesthesia Quick Evaluation

## 2022-06-03 NOTE — Interval H&P Note (Signed)
History and Physical Interval Note:  06/03/2022 12:49 PM  Jessica [name]  has presented today for surgery, with the diagnosis of Iron deficiency anemia and heme positive stool.  The various methods of treatment have been discussed with the patient and family. After consideration of risks, benefits and other options for treatment, the patient has consented to  Procedure(s): COLONOSCOPY WITH PROPOFOL (N/A) as a surgical intervention.  The patient's history has been reviewed, patient examined, no change in status, stable for surgery.  I have reviewed the patient's chart and labs.  Questions were answered to the patient's satisfaction.    Bowel prep did not achieve clear stool.  Recommended further prepping and repeat attempt tomorrow, but patient did not want to do any further prep and wanted to proceed with colonoscopy today, knowing the prep may be inadequate.  Given that we are primarily ruling out large polyps/masses as a cause of iron deficiency anemia, and she has now been off Plavix 6 days, this seems reasonable.   [name]

## 2022-06-03 NOTE — Progress Notes (Addendum)
[facility] Progress Note  CC:   acute on chronic IDA, FOBT+  Subjective: She denies having any abdominal pain.  She is currently sitting on the commode, passed a moderate amount of thick watery brown liquid per the rectum. No chest pain or shortness of breath.  No family at the bedside.  RN at the bedside, informed to give patient a stat dose of MoviPrep to complete in 1 hour then n.p.o. for planned colonoscopy this afternoon.  Objective:  Vital signs in last 24 hours: Temp:  [97.8 F (36.6 C)-98.6 F (37 C)] 98.6 F (37 C) (08/01 2146) Pulse Rate:  [69-71] 71 (08/01 2146) Resp:  [18] 18 (08/01 2146) BP: (150-153)/(70-78) 153/70 (08/01 2146) SpO2:  [94 %-95 %] 94 % (08/01 2146) Weight:  [79.8 kg] 79.8 kg (08/02 0500) Last BM Date : 05/31/22  General: Alert 81 year old female fatigued appearing in no acute distress. Heart: Irregular rhythm, no murmurs. Pulm: Breath sounds clear throughout. Abdomen: Soft, nondistended.  Nontender. Extremities:  Without edema. Neurologic:  Alert and  oriented x 4.  Speech is clear.  Moves all extremities. Psych:  Alert and cooperative. Normal mood and affect.  Intake/Output from previous day: 08/01 0701 - 08/02 0700 In: 537.4 [P.O.:240; I.V.:197.4; IV Piggyback:100] Out: 800 [Urine:800] Intake/Output this shift: No intake/output data recorded.  Lab Results: Recent Labs    06/01/22 0338 06/03/22 0440  WBC 9.4 7.8  HGB 9.3* 10.1*  HCT 31.2* 33.4*  PLT 354 360   BMET Recent Labs    06/01/22 0338 06/03/22 0440  NA 142 143  K 3.3* 3.4*  CL 109 108  CO2 25 26  GLUCOSE 89 115*  BUN 6* 7*  CREATININE 0.51 0.63  CALCIUM 9.2 9.3   LFT No results for input(s): "PROT", "ALBUMIN", "AST", "ALT", "ALKPHOS", "BILITOT", "BILIDIR", "IBILI" in the last 72 hours. PT/INR No results for input(s): "LABPROT", "INR" in the last 72 hours. Hepatitis Panel No results for input(s): "HEPBSAG", "HCVAB", "HEPAIGM", "HEPBIGM" in the  last 72 hours.  No results found.  Assessment / Plan:  14) 81 year old female admitted to the hospital 05/28/2022 with acute on chronic IDA in the setting of ASA/Plavix.  Admission hemoglobin 7.3 with a baseline hemoglobin 12-13.  Transfused 1 unit of PRBCs -> Hg 8.3 -> Hg 9.3 -> today Hg 10.1. Iron 18.  Received Feraheme IV x1.  EGD 05/29/2022 showed a nonobstructing Schatzki's ring, 2 cm hiatal hernia, gastric diverticulum without bleeding and a normal duodenum. Scheduled for a colonoscopy this afternoon.  She completed 2 doses of movie prep but still passing thick watery brown liquid per the rectum this morning.  A stat dose of movie prep ordered, to complete in 1 hour then NPO. -Stat dose of movie prep now then NPO -Proceed with colonoscopy today as scheduled -Transfuse for hemoglobin less than 8  -CBC in AM -Further recommendations to be determined after colonoscopy complete   2) History of breast cancer s/p post radiation/chemotherapy 2018   3) History of CVA 03/2022 residual aphasia and right hemiparesis. On ASA.  Plavix on hold.    4) GERD -Continue Pantoprazole 40 mg IV twice daily   5) History of a 1cm tubulovillous adenomatous polyp per colonoscopy in 2015. -See plan in #1   6) UTI on Ceftriaxone  7) Hypokalemia -KCl replacement per the hospitalist  Principal Problem:   Acute lower GI bleeding Active Problems:   Essential hypertension - well controlled   Dyslipidemia, goal LDL below 160  GERD (gastroesophageal reflux disease)   Acquired hypothyroidism   Depression with anxiety   Hypokalemia   Acute blood loss anemia   Heme positive stool   Iron deficiency anemia due to chronic blood loss     LOS: 4 days   Noralyn Pick  06/03/2022, 08:11AM  I have taken a history, reviewed the chart and examined the patient. I performed a substantive portion of this encounter, including complete performance of at least one of the key components, in conjunction with the  APP. I agree with the APP's note, impression and recommendations Please see colonoscopy report today for findings and further recommendations.  Wilkes Potvin E. Candis Schatz, MD [facility]

## 2022-06-03 NOTE — Anesthesia Postprocedure Evaluation (Signed)
Anesthesia Post Note  Patient: Jessica [name]  Procedure(s) Performed: COLONOSCOPY WITH PROPOFOL POLYPECTOMY     Patient location during evaluation: PACU Anesthesia Type: MAC Level of consciousness: awake and alert Pain management: pain level controlled Vital Signs Assessment: post-procedure vital signs reviewed and stable Respiratory status: spontaneous breathing, nonlabored ventilation, respiratory function stable and patient connected to nasal cannula oxygen Cardiovascular status: stable and blood pressure returned to baseline Postop Assessment: no apparent nausea or vomiting Anesthetic complications: no   No notable events documented.  Last Vitals:  Vitals:   06/03/22 1415 06/03/22 1420  BP:  (!) 192/77  Pulse:  87  Resp:  14  Temp: 36.6 C   SpO2:  94%    Last Pain:  Vitals:   06/03/22 1420  TempSrc:   PainSc: 0-No pain                 Blimi Godby S

## 2022-06-04 ENCOUNTER — Other Ambulatory Visit: Payer: Self-pay

## 2022-06-04 ENCOUNTER — Telehealth: Payer: Self-pay

## 2022-06-04 DIAGNOSIS — K922 Gastrointestinal hemorrhage, unspecified: Secondary | ICD-10-CM | POA: Diagnosis not present

## 2022-06-04 LAB — COMPREHENSIVE METABOLIC PANEL
ALT: 22 U/L (ref 0–44)
AST: 21 U/L (ref 15–41)
Albumin: 3.1 g/dL — ABNORMAL LOW (ref 3.5–5.0)
Alkaline Phosphatase: 68 U/L (ref 38–126)
Anion gap: 8 (ref 5–15)
BUN: 5 mg/dL — ABNORMAL LOW (ref 8–23)
CO2: 27 mmol/L (ref 22–32)
Calcium: 8.8 mg/dL — ABNORMAL LOW (ref 8.9–10.3)
Chloride: 108 mmol/L (ref 98–111)
Creatinine, Ser: 0.5 mg/dL (ref 0.44–1.00)
GFR, Estimated: >60 mL/min (ref >60)
Glucose, Bld: 101 mg/dL — ABNORMAL HIGH (ref 70–99)
Potassium: 3.6 mmol/L (ref 3.5–5.1)
Sodium: 143 mmol/L (ref 135–145)
Total Bilirubin: 0.6 mg/dL (ref 0.3–1.2)
Total Protein: 5.7 g/dL — ABNORMAL LOW (ref 6.5–8.1)

## 2022-06-04 LAB — CBC WITH DIFFERENTIAL/PLATELET
Abs Immature Granulocytes: 0.02 10*3/uL (ref 0.00–0.07)
Basophils Absolute: 0 10*3/uL (ref 0.0–0.1)
Basophils Relative: 1 %
Eosinophils Absolute: 0.4 10*3/uL (ref 0.0–0.5)
Eosinophils Relative: 6 %
HCT: 29 % — ABNORMAL LOW (ref 36.0–46.0)
Hemoglobin: 8.9 g/dL — ABNORMAL LOW (ref 12.0–15.0)
Immature Granulocytes: 0 %
Lymphocytes Relative: 21 %
Lymphs Abs: 1.2 10*3/uL (ref 0.7–4.0)
MCH: 28.5 pg (ref 26.0–34.0)
MCHC: 30.7 g/dL (ref 30.0–36.0)
MCV: 92.9 fL (ref 80.0–100.0)
Monocytes Absolute: 0.5 10*3/uL (ref 0.1–1.0)
Monocytes Relative: 9 %
Neutro Abs: 3.7 10*3/uL (ref 1.7–7.7)
Neutrophils Relative %: 63 %
Platelets: 284 10*3/uL (ref 150–400)
RBC: 3.12 MIL/uL — ABNORMAL LOW (ref 3.87–5.11)
RDW: 19.9 % — ABNORMAL HIGH (ref 11.5–15.5)
WBC: 5.9 10*3/uL (ref 4.0–10.5)
nRBC: 0 % (ref 0.0–0.2)

## 2022-06-04 LAB — URINE CULTURE: Culture: >=100000 — AB

## 2022-06-04 LAB — PHOSPHORUS: Phosphorus: 3.5 mg/dL (ref 2.5–4.6)

## 2022-06-04 LAB — MAGNESIUM: Magnesium: 1.7 mg/dL (ref 1.7–2.4)

## 2022-06-04 LAB — SURGICAL PATHOLOGY

## 2022-06-04 LAB — HEMOGLOBIN AND HEMATOCRIT, BLOOD
HCT: 30.1 % — ABNORMAL LOW (ref 36.0–46.0)
Hemoglobin: 9.3 g/dL — ABNORMAL LOW (ref 12.0–15.0)

## 2022-06-04 MED ORDER — FERROUS SULFATE 325 (65 FE) MG PO TABS
325.0000 mg | ORAL_TABLET | Freq: Every day | ORAL | Status: DC
  Administered 2022-06-05: 325 mg via ORAL
  Filled 2022-06-04: qty 1

## 2022-06-04 MED ORDER — SODIUM CHLORIDE 0.9 % IV SOLN
510.0000 mg | Freq: Once | INTRAVENOUS | Status: AC
  Administered 2022-06-04: 510 mg via INTRAVENOUS
  Filled 2022-06-04: qty 17

## 2022-06-04 MED ORDER — ASCORBIC ACID 500 MG PO TABS
500.0000 mg | ORAL_TABLET | Freq: Every day | ORAL | Status: DC
  Administered 2022-06-04 – 2022-06-05 (×2): 500 mg via ORAL
  Filled 2022-06-04 (×2): qty 1

## 2022-06-04 MED ORDER — DICLOFENAC SODIUM 1 % EX GEL
2.0000 g | Freq: Two times a day (BID) | CUTANEOUS | Status: DC | PRN
  Administered 2022-06-04: 2 g via TOPICAL
  Filled 2022-06-04: qty 100

## 2022-06-04 MED ORDER — SODIUM CHLORIDE 0.9 % IV SOLN: 500.0000 mg | Freq: Once | INTRAVENOUS | Status: DC

## 2022-06-04 NOTE — Evaluation (Signed)
Occupational Therapy Evaluation Patient Details Name: Jessica [name] MRN: [id number] DOB: [birth year] Today's Date: 06/04/2022   History of Present Illness 81 year old female PMHx HTN, HLD, GERD, left breast invasive ductal carcinoma triple negative s/p partial mastectomy 2018, history of staph wound  infection of the left axilla/cellulitis of the left breast followed by Dr. 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   Clinical Impression   Patient is a 81 year old female who was admitted for above. Patient was living at home with family support prior level but able to engage in ADLs seated. Patient was noted to have had increase in unsteadiness and decreased functional activity tolerance since time in hospital. Patient and daughter expressed strong desire to have [facility] services especially SLP at time of d/c from hospital. Patient would continue to benefit from skilled OT services at this time while admitted and after d/c to address noted deficits in order to improve overall safety and independence in ADLs.       Recommendations for follow up therapy are one component of a multi-disciplinary discharge planning process, led by the attending physician.  Recommendations may be updated based on patient status, additional functional criteria and insurance authorization.   Follow Up Recommendations  Home health OT (patient wants [facility] SLP as well.)    Assistance Recommended at Discharge Frequent or constant Supervision/Assistance  Patient can return home with the following A lot of help with walking and/or transfers;A lot of help with bathing/dressing/bathroom;Assistance with cooking/housework;Direct supervision/assist for financial management;Assist for transportation;Help with stairs or ramp for entrance;Direct supervision/assist for  medications management    Functional Status Assessment  Patient has had a recent decline in their functional status and demonstrates the ability to make significant improvements in function in a reasonable and predictable amount of time.  Equipment Recommendations  None recommended by OT (per patients daughter they have all needed equipment at home)    Recommendations for Other Services       Precautions / Restrictions Precautions Precautions: Fall Precaution Comments: expressive aphasia, hx CVA with Right residual deficits Restrictions Weight Bearing Restrictions: No      Mobility Bed Mobility               General bed mobility comments: patient was in recliner and returned to the same    Transfers Overall transfer level: Needs assistance Equipment used: Rolling walker (2 wheels) Transfers: Sit to/from Stand Sit to Stand: Min guard           General transfer comment: increased time and effortful, pt uses legs against bed to self assist, no physical assist required      Balance Overall balance assessment: Mild deficits observed, not formally tested                                         ADL either performed or assessed with clinical judgement   ADL Overall ADL's : Needs assistance/impaired Eating/Feeding: Set up;Sitting   Grooming: Sitting;Minimal assistance   Upper Body Bathing: Minimal assistance;Sitting   Lower Body Bathing: Moderate assistance;Sit to/from stand;Sitting/lateral leans   Upper Body Dressing : Sitting;Moderate assistance   Lower Body Dressing: Moderate assistance;Sit to/from stand;Sitting/lateral leans   Toilet Transfer: Minimal assistance;Ambulation;Rolling walker (2 wheels) Toilet Transfer Details (  indicate cue type and reason): with RW with incresed time to be able to rest LUE onto walker. increased pain in shoulder on this date with nurse aware. patient needed cues for safety with RW with noted LOB when stepping  backwards away from door opening. Toileting- Clothing Manipulation and Hygiene: Minimal assistance;Sitting/lateral lean Toileting - Clothing Manipulation Details (indicate cue type and reason): patient completed hygiene seated. patient's daughter in room reported patient does better seated vs. standing             Vision Patient Visual Report: No change from baseline       Perception     Praxis      Pertinent Vitals/Pain Pain Assessment Pain Assessment: No/denies pain     Hand Dominance Right   Extremity/Trunk Assessment Upper Extremity Assessment Upper Extremity Assessment: LUE deficits/detail RUE Deficits / Details: residual right sided deficits s/p CVA in May LUE Deficits / Details: pain in LUE with all movement   Lower Extremity Assessment Lower Extremity Assessment: Defer to PT evaluation RLE Deficits / Details: residual right sided deficits s/p CVA in May   Cervical / Trunk Assessment Cervical / Trunk Assessment: Normal   Communication Communication Communication: Expressive difficulties   Cognition Arousal/Alertness: Awake/alert Behavior During Therapy: Flat affect Overall Cognitive Status: No family/caregiver present to determine baseline cognitive functioning                                 General Comments: expressive aphasia, difficult to understand at times     General Comments       Exercises     Shoulder Instructions      Home Living Family/patient expects to be discharged to:: Private residence Living Arrangements: Children (daughter) Available Help at Discharge: Family;Available 24 hours/day Type of Home: House Home Access: Stairs to enter [employer] of Steps: 1 in front, 1 in garage Entrance Stairs-Rails: None Home Layout: One level     Bathroom Shower/Tub: Occupational psychologist: Handicapped height Bathroom Accessibility: Yes How Accessible: Accessible via walker Home Equipment: Conservation officer, nature  (2 wheels)   Additional Comments: information from previous May admision      Prior Functioning/Environment Prior Level of Function : Independent/Modified Independent;Driving             Mobility Comments: states she uses RW ADLs Comments: reports "sister" assists with donning socks (lives with daughter per chart)        OT Problem List: Decreased strength;Decreased activity tolerance;Impaired balance (sitting and/or standing);Decreased safety awareness;Cardiopulmonary status limiting activity;Decreased knowledge of precautions;Decreased knowledge of use of DME or AE;Impaired UE functional use;Pain      OT Treatment/Interventions: Self-care/ADL training;Therapeutic exercise;Neuromuscular education;Energy conservation;DME and/or AE instruction;Therapeutic activities;Balance training;Patient/family education    OT Goals(Current goals can be found in the care plan section) Acute Rehab OT Goals Patient Stated Goal: to continue [facility] services at home OT Goal Formulation: With patient/family Time For Goal Achievement: 06/18/22 Potential to Achieve Goals: Fair  OT Frequency: Min 2X/week    Co-evaluation              AM-PAC OT "6 Clicks" Daily Activity     Outcome Measure Help from another person eating meals?: A Little Help from another person taking care of personal grooming?: A Little Help from another person toileting, which includes using toliet, bedpan, or urinal?: A Lot Help from another person bathing (including washing, rinsing, drying)?: A Lot Help from another  person to put on and taking off regular upper body clothing?: A Little Help from another person to put on and taking off regular lower body clothing?: A Lot 6 Click Score: 15   End of Session Equipment Utilized During Treatment: Rolling walker (2 wheels) Nurse Communication: Mobility status  Activity Tolerance: Patient tolerated treatment well Patient left: in chair;with call bell/phone within reach;with  family/visitor present;with chair alarm set  OT Visit Diagnosis: Unsteadiness on feet (R26.81);Other abnormalities of gait and mobility (R26.89);Muscle weakness (generalized) (M62.81);History of falling (Z91.81);Hemiplegia and hemiparesis                Time: 6834-1962 OT Time Calculation (min): 17 min Charges:  OT General Charges $OT Visit: 1 Visit OT Evaluation $OT Eval Low Complexity: 1 Low  Leota Sauers, MS Acute Rehabilitation Department Office# [phone] Pager# [phone]   Marcellina Millin 06/04/2022, 3:35 PM

## 2022-06-04 NOTE — Progress Notes (Signed)
PROGRESS NOTE    Jessica [name]  MEQ:[id number] DOB: January 16, 1941 DOA: 05/28/2022 PCP: [name], MD     Brief Narrative:  51 WF PMHx  left breast invasive ductal carcinoma triple negative, history of staph wound  infection of the left axilla/cellulitis of the left breast followed by Dr. Marin Olp S/P cycle #4 completed adjuvant therapy 09/09/2017, Hx  CVA in May with residual expressive aphasia and right hemiparesis placed on aspirin and Plavix, HTN, HLD, GERD   Presented having dark stool past week and a half and blood work showed anemia, Hemoccult positive GI was consulted 1 unit PRBC transfused and patient was admitted for further management . 7/28-underwent EGD-Schatzki ring, hiatal hernia gastric diverticulum without bleeding-advised colonoscopy on Wednesday after Plavix washout, continue clear liquid diet.         Subjective: 8/3 A/O x4 negative abdominal pain, negative CP.   Assessment & Plan: Covid vaccination;   Principal Problem:   Acute lower GI bleeding Active Problems:   Essential hypertension - well controlled   Dyslipidemia, goal LDL below 160   GERD (gastroesophageal reflux disease)   Acquired hypothyroidism   Depression with anxiety   Hypokalemia   Acute blood loss anemia   Heme positive stool   Iron deficiency anemia due to chronic blood loss   Acute blood loss anemia Lab Results  Component Value Date   HGB 9.3 (L) 06/04/2022   HGB 8.9 (L) 06/04/2022   HGB 10.1 (L) 06/03/2022   HGB 9.3 (L) 06/01/2022   HGB 8.7 (L) 05/31/2022  -Stable -8/2 s/p colonoscopy -8/3 per Dr. Marin Olp oncology request patient receive IV iron. -8/3 iron dextran IV 500 mg x 1 + vitamin C 500 mg daily - 8/3 iron po 325 mg+ vitamin C 500 mg daily, starting on 8/4  Suspected GI bleeding: -Patient on aspirin Plavix for acute CVA in May,now with acute blood loss anemia,  -Hemoccult positive dark stool suspecting GI bleeding.  -7/28 S/P EGD-Schatzki ring, hiatal hernia  gastric diverticulum without bleeding -S/p Colonoscopy on Tuesday after Plavix washout, continue clear liquid diet until Monday, cont ppi bid, serial h/h  s/p ferraheme iv.   -ok to restart ASA -See acute blood loss anemia   Non-compliance with treatment- now more cooperative -r/o UTI- culture pending-- started rocephin -gentle IVF until eating better -delirium precautions  Essential HTN -Amlodipine 10 mg daily -Hold HCTZ - Hydralazine PRN  Dyslipidemia: -Atorvastatin 80 mg daily   GERD -cont ppi  Acquired hypothyroidism:  - Synthroid 75 mcg daily  Depression with anxiety:  -Wellbutrin 150 mg daily - Neurontin 100 mg qhs -Effexor XR 225 mg daily   Hypokalemia: -Potassium goal>4 -8/2 Potassium IV 50 mEq  Class I Obesity:Patient's Body mass index is 30.03 kg/m. : Will benefit with PCP follow-up, weight loss  healthy lifestyle and outpatient follow-up            Mobility Assessment (last 72 hours)     Mobility Assessment     Row Name 06/04/22 1533 06/04/22 1336 06/04/22 1018 06/04/22 0915 06/03/22 2030   Does patient have an order for bedrest or is patient medically unstable -- -- No - Continue assessment No - Continue assessment No - Continue assessment   What is the highest level of mobility based on the progressive mobility assessment? Level 5 (Walks with assist in room/hall) - Balance while stepping forward/back and can walk in room with assist - Complete Level 5 (Walks with assist in room/hall) - Balance while stepping forward/back and can  walk in room with assist - Complete Level 5 (Walks with assist in room/hall) - Balance while stepping forward/back and can walk in room with assist - Complete Level 5 (Walks with assist in room/hall) - Balance while stepping forward/back and can walk in room with assist - Complete Level 5 (Walks with assist in room/hall) - Balance while stepping forward/back and can walk in room with assist - Complete    Row Name 06/03/22 1321  06/02/22 2045         Does patient have an order for bedrest or is patient medically unstable No - Continue assessment No - Continue assessment      What is the highest level of mobility based on the progressive mobility assessment? Level 5 (Walks with assist in room/hall) - Balance while stepping forward/back and can walk in room with assist - Complete Level 5 (Walks with assist in room/hall) - Balance while stepping forward/back and can walk in room with assist - Complete                        DVT prophylaxis: SCD Code Status: DNR Family Communication: 8/2 wife at bedside for discussion of plan of care all questions answered  Status is: Inpatient    Dispo: The patient is from: Home              Anticipated d/c is to: Home              Anticipated d/c date is: 2 days              Patient currently is not medically stable to d/c.      Consultants:  GI  Procedures/Significant Events:  8/2 colonoscopy  I have personally reviewed and interpreted all radiology studies and my findings are as above.  VENTILATOR SETTINGS:    Cultures   Antimicrobials:    Devices    LINES / TUBES:      Continuous Infusions:  cefTRIAXone (ROCEPHIN)  IV 1 g (06/04/22 1015)     Objective: Vitals:   06/03/22 1451 06/03/22 2015 06/04/22 0500 06/04/22 0958  BP: (!) 157/73 (!) 146/58  135/66  Pulse: 82 79  74  Resp: '19 18  20  '$ Temp: 97.8 F (36.6 C) 98.2 F (36.8 C)  98.2 F (36.8 C)  TempSrc: Oral   Oral  SpO2: 99% 91%  97%  Weight:   78.4 kg   Height:        Intake/Output Summary (Last 24 hours) at 06/04/2022 1606 Last data filed at 06/04/2022 2409 Gross per 24 hour  Intake 199.02 ml  Output 950 ml  Net -750.98 ml    Filed Weights   06/03/22 0500 06/03/22 1254 06/04/22 0500  Weight: 79.8 kg 79.8 kg 78.4 kg    Examination:  General: A/O x4, No acute respiratory distress Eyes: negative scleral hemorrhage, negative anisocoria, negative icterus ENT:  Negative Runny nose, negative gingival bleeding, Neck:  Negative scars, masses, torticollis, lymphadenopathy, JVD Lungs: Clear to auscultation bilaterally without wheezes or crackles Cardiovascular: Regular rate and rhythm without murmur gallop or rub normal S1 and S2 Abdomen: Mild abdominal pain to palpation, nondistended, positive soft, bowel sounds, no rebound, no ascites, no appreciable mass Extremities: No significant cyanosis, clubbing, or edema bilateral lower extremities Skin: Negative rashes, lesions, ulcers Psychiatric:  Negative depression, negative anxiety, negative fatigue, negative mania  Central nervous system:  Cranial nerves II through XII intact, tongue/uvula midline, all extremities muscle strength 5/5, sensation  intact throughout, negative dysarthria, negative expressive aphasia, negative receptive aphasia.  .     Data Reviewed: Care during the described time interval was provided by me .  I have reviewed this patient's available data, including medical history, events of note, physical examination, and all test results as part of my evaluation.  CBC: Recent Labs  Lab 05/28/22 1728 05/29/22 0149 05/29/22 0850 05/30/22 1004 05/31/22 0405 06/01/22 0338 06/03/22 0440 06/04/22 0335 06/04/22 1102  WBC 6.1 6.6  --  5.6 6.6 9.4 7.8 5.9  --   NEUTROABS 3.8 3.9  --   --   --   --   --  3.7  --   HGB 7.1* 8.3*   < > 8.9* 8.7* 9.3* 10.1* 8.9* 9.3*  HCT 22.8* 26.3*   < > 29.3* 28.3* 31.2* 33.4* 29.0* 30.1*  MCV 93.8 86.2  --  89.9 90.4 91.2 92.0 92.9  --   PLT 336 321  --  331 324 354 360 284  --    < > = values in this interval not displayed.    Basic Metabolic Panel: Recent Labs  Lab 05/29/22 0149 05/30/22 0700 06/01/22 0338 06/03/22 0440 06/04/22 0335  NA 143 140 142 143 143  K 3.7 3.3* 3.3* 3.4* 3.6  CL 105 108 109 108 108  CO2 '28 25 25 26 27  '$ GLUCOSE 92 149* 89 115* 101*  BUN 15 9 6* 7* 5*  CREATININE 0.54 0.58 0.51 0.63 0.50  CALCIUM 8.8* 9.2 9.2 9.3  8.8*  MG 2.0  --   --   --  1.7  PHOS  --   --   --   --  3.5    GFR: Estimated Creatinine Clearance: 55.6 mL/min (by C-G formula based on SCr of 0.5 mg/dL). Liver Function Tests: Recent Labs  Lab 05/28/22 1728 05/29/22 0149 06/04/22 0335  AST '17 18 21  '$ ALT '18 18 22  '$ ALKPHOS 68 67 68  BILITOT 0.4 1.0 0.6  PROT 6.7 6.4* 5.7*  ALBUMIN 3.4* 3.6 3.1*    No results for input(s): "LIPASE", "AMYLASE" in the last 168 hours. No results for input(s): "AMMONIA" in the last 168 hours. Coagulation Profile: Recent Labs  Lab 05/28/22 1728  INR 1.1    Cardiac Enzymes: No results for input(s): "CKTOTAL", "CKMB", "CKMBINDEX", "TROPONINI" in the last 168 hours. BNP (last 3 results) No results for input(s): "PROBNP" in the last 8760 hours. HbA1C: No results for input(s): "HGBA1C" in the last 72 hours. CBG: No results for input(s): "GLUCAP" in the last 168 hours. Lipid Profile: No results for input(s): "CHOL", "HDL", "LDLCALC", "TRIG", "CHOLHDL", "LDLDIRECT" in the last 72 hours. Thyroid Function Tests: No results for input(s): "TSH", "T4TOTAL", "FREET4", "T3FREE", "THYROIDAB" in the last 72 hours. Anemia Panel: No results for input(s): "VITAMINB12", "FOLATE", "FERRITIN", "TIBC", "IRON", "RETICCTPCT" in the last 72 hours. Sepsis Labs: No results for input(s): "PROCALCITON", "LATICACIDVEN" in the last 168 hours.  Recent Results (from the past 240 hour(s))  Urine Culture     Status: Abnormal   Collection Time: 06/02/22  9:55 AM   Specimen: Urine, Clean Catch  Result Value Ref Range Status   Specimen Description   Final    URINE, CLEAN CATCH Performed at [facility], [place] [address] [place], [place] [address]    Special Requests   Final    NONE Performed at [facility], [place] [address], [place], [place] [address]    Culture (A)  Final    >=  100,000 COLONIES/mL ESCHERICHIA COLI >=100,000 COLONIES/mL STAPHYLOCOCCUS AUREUS    Report Status  06/04/2022 FINAL  Final   Organism ID, Bacteria STAPHYLOCOCCUS AUREUS (A)  Final   Organism ID, Bacteria ESCHERICHIA COLI (A)  Final      Susceptibility   Escherichia coli - MIC*    AMPICILLIN <=2 SENSITIVE Sensitive     CEFAZOLIN <=4 SENSITIVE Sensitive     CEFEPIME <=0.12 SENSITIVE Sensitive     CEFTRIAXONE <=0.25 SENSITIVE Sensitive     CIPROFLOXACIN >=4 RESISTANT Resistant     GENTAMICIN <=1 SENSITIVE Sensitive     IMIPENEM <=0.25 SENSITIVE Sensitive     NITROFURANTOIN <=16 SENSITIVE Sensitive     TRIMETH/SULFA <=20 SENSITIVE Sensitive     AMPICILLIN/SULBACTAM <=2 SENSITIVE Sensitive     PIP/TAZO <=4 SENSITIVE Sensitive     * >=100,000 COLONIES/mL ESCHERICHIA COLI   Staphylococcus aureus - MIC*    CIPROFLOXACIN <=0.5 SENSITIVE Sensitive     GENTAMICIN <=0.5 SENSITIVE Sensitive     NITROFURANTOIN <=16 SENSITIVE Sensitive     OXACILLIN 0.5 SENSITIVE Sensitive     TETRACYCLINE <=1 SENSITIVE Sensitive     VANCOMYCIN 1 SENSITIVE Sensitive     TRIMETH/SULFA <=10 SENSITIVE Sensitive     CLINDAMYCIN RESISTANT Resistant     RIFAMPIN <=0.5 SENSITIVE Sensitive     Inducible Clindamycin POSITIVE Resistant     * >=100,000 COLONIES/mL STAPHYLOCOCCUS AUREUS         Radiology Studies: No results found.      Scheduled Meds:  amLODipine  10 mg Oral Daily   aspirin EC  81 mg Oral Daily   atorvastatin  80 mg Oral Daily   buPROPion  150 mg Oral Daily   Chlorhexidine Gluconate Cloth  6 each Topical Daily   cholecalciferol  1,000 Units Oral Daily   folic acid  2 mg Oral Daily   gabapentin  100 mg Oral QHS   levothyroxine  75 mcg Oral Q0600   pantoprazole (PROTONIX) IV  40 mg Intravenous Q12H   sodium chloride flush  10-40 mL Intracatheter Q12H   venlafaxine XR  225 mg Oral Daily   Continuous Infusions:  cefTRIAXone (ROCEPHIN)  IV 1 g (06/04/22 1015)     LOS: 5 days    Time spent:40 min    Doni Widmer, Geraldo Docker, MD [facility]   If 7PM-7AM, please contact  night-coverage 06/04/2022, 4:06 PM

## 2022-06-04 NOTE — Progress Notes (Signed)
Needle change for port due today. Family said this AM she may be d/c'd today, however this evening it was decided she would d/c most likely tomorrow, and is needing an infusion this evening. Spoke with family member who is ok on waiting for needle change until further info tomorrow after d/c is decided.

## 2022-06-04 NOTE — Telephone Encounter (Signed)
-----   Message from [name], MD sent at 06/03/2022  2:40 PM EDT ----- Regarding: outpatient follow up Jessica [name],  Can you please order a CBC to be done in 2 weeks for [name] and schedule a routine office follow up to discuss further evaluation of iron deficiency anemia?  JP,  Colonoscopy unrevealing for source, although cecum not well visualized (pt refused to do 2 day bowel prep as recommended).  Discussed with daughter monitoring CBC as outpatient and considering a VCE vs repeat colon as outpatient.  She never had overt bleeding and her hgb remained stable the entire admission

## 2022-06-04 NOTE — Plan of Care (Signed)
Discussed with patient in front of family plan of care for the evening, pain management and bath for morning with some teach back displayed.  Problem: Education: Goal: Knowledge of General Education information will improve Description: Including pain rating scale, medication(s)/side effects and non-pharmacologic comfort measures Outcome: Progressing   Problem: Health Behavior/Discharge Planning: Goal: Ability to manage health-related needs will improve Outcome: Progressing

## 2022-06-04 NOTE — Progress Notes (Signed)
Jessica [name] is doing okay.  She had a colonoscopy yesterday.  Some polyps were found.  There is no obvious bleeding.  She dropped her hemoglobin a little bit.  This morning, her hemoglobin is 8.9.  White cell count 5.9.  Platelet count 284,000.  Her BUN is 5 creatinine 0.5.  I am not sure how much she really is eating.  There is no nausea or vomiting.  Hopefully, everything will begin to equilibrate and she will start to bring up her hemoglobin.  She is to get IV iron.  She has been on folic acid.  I have not yet checked her EPO level to see if this might be on the lower side.    Her vital signs are stable.  Temperature 99.  Pulse 79.  Blood pressure 146/58.  Her lungs are clear.  Cardiac exam regular rate and rhythm.  Abdomen is soft.  Bowel sounds are present.  There is no guarding or rebound.  She has no palpable liver or spleen tip.  Extremity shows no clubbing, cyanosis or edema.  Jessica [name] might be having some small oozing.  This could be accounting for the drop in her hemoglobin.  I think we can check her erythropoietin level to see if this might be on the low side given her age.  Also, she will be able to go home soon.  I know that she is getting fantastic care from all the staff on [place], MD  Psalm 59:16

## 2022-06-04 NOTE — Telephone Encounter (Signed)
Order and reminder in for CBC. Pt scheduled to see Dr. Henrene Pastor 8/23'@9am'$ . Appt letter mailed to pt.

## 2022-06-04 NOTE — Progress Notes (Signed)
Patient ate a bowl of grits for dinner last night after her procedure, and a muffin with coffee for breakfast this morning.  C/o chronic 8/10 shoulder pain, asking for Tylenol and Voltaren.  MD notified.  No c/o N/V.  Angie Fava, RN

## 2022-06-04 NOTE — Evaluation (Signed)
Physical Therapy Evaluation Patient Details Name: Jessica [name] MRN: [id number] DOB: 06/04/1941 Today's Date: 06/04/2022  History of Present Illness  81 year old female PMHx HTN, HLD, GERD, left breast invasive ductal carcinoma triple negative s/p partial mastectomy 2018, history of staph wound  infection of the left axilla/cellulitis of the left breast followed by Dr. 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  Clinical Impression  Pt admitted with above diagnosis.  Pt currently with functional limitations due to the deficits listed below (see PT Problem List). Pt will benefit from skilled PT to increase their independence and safety with mobility to allow discharge to the venue listed below.  Pt able to ambulate 80 feet with RW and min/guard assist.  Pt would like to d/c home and reports plan for HHPT.  Pt with recent CVA in May and reports she had completed therapies.  Pt would benefit from returning to familiar environment and discharging home if family feels able to continue to provide care.        Recommendations for follow up therapy are one component of a multi-disciplinary discharge planning process, led by the attending physician.  Recommendations may be updated based on patient status, additional functional criteria and insurance authorization.  Follow Up Recommendations Home health PT      Assistance Recommended at Discharge Set up Supervision/Assistance  Patient can return home with the following  A little help with walking and/or transfers;A little help with bathing/dressing/bathroom;Help with stairs or ramp for entrance    Equipment Recommendations None recommended by PT  Recommendations for Other Services       Functional Status Assessment Patient has had a recent decline in their functional status and  demonstrates the ability to make significant improvements in function in a reasonable and predictable amount of time.     Precautions / Restrictions Precautions Precautions: Fall Precaution Comments: expressive aphasia, hx CVA with Right residual deficits      Mobility  Bed Mobility Overal bed mobility: Needs Assistance Bed Mobility: Supine to Sit     Supine to sit: Supervision, HOB elevated     General bed mobility comments: exited left side of bed, pt held right hand to right thigh upon bringing LEs over EOB    Transfers Overall transfer level: Needs assistance Equipment used: Rolling walker (2 wheels) Transfers: Sit to/from Stand Sit to Stand: Min guard           General transfer comment: increased time and effortful, pt uses legs against bed to self assist, no physical assist required    Ambulation/Gait Ambulation/Gait assistance: Min guard Gait Distance (Feet): 80 Feet Assistive device: Rolling walker (2 wheels) Gait Pattern/deviations: Step-through pattern, Decreased stride length Gait velocity: decr     General Gait Details: slow with turning, no overt LOB or unsteadiness, utilized RW, distance to tolerance  Science writer    Modified Rankin (Stroke Patients Only)       Balance Overall balance assessment: Mild deficits observed, not formally tested                                           Pertinent Vitals/Pain Pain Assessment Pain Assessment: No/denies pain  Home Living Family/patient expects to be discharged to:: Private residence Living Arrangements: Children (daughter) Available Help at Discharge: Family;Available 24 hours/day Type of Home: House Home Access: Stairs to enter Entrance Stairs-Rails: None Entrance Stairs-Number of Steps: 1 in front, 1 in garage   Home Layout: One level Home Equipment: Conservation officer, nature (2 wheels) Additional Comments: information from previous May admision     Prior Function Prior Level of Function : Independent/Modified Independent;Driving             Mobility Comments: states she uses RW ADLs Comments: reports "sister" assists with donning socks (lives with daughter per chart)     Hand Dominance        Extremity/Trunk Assessment   Upper Extremity Assessment Upper Extremity Assessment: RUE deficits/detail RUE Deficits / Details: residual right sided deficits s/p CVA in May    Lower Extremity Assessment Lower Extremity Assessment: RLE deficits/detail RLE Deficits / Details: residual right sided deficits s/p CVA in May    Cervical / Trunk Assessment Cervical / Trunk Assessment: Normal  Communication   Communication: Expressive difficulties  Cognition Arousal/Alertness: Awake/alert Behavior During Therapy: Flat affect Overall Cognitive Status: No family/caregiver present to determine baseline cognitive functioning                                 General Comments: expressive aphasia, difficult to understand at times        General Comments      Exercises     Assessment/Plan    PT Assessment Patient needs continued PT services  PT Problem List Decreased strength;Decreased mobility;Decreased balance;Decreased activity tolerance;Decreased knowledge of use of DME       PT Treatment Interventions Gait training;DME instruction;Therapeutic exercise;Functional mobility training;Therapeutic activities;Patient/family education;Balance training    PT Goals (Current goals can be found in the Care Plan section)  Acute Rehab PT Goals PT Goal Formulation: With patient Time For Goal Achievement: 06/18/22 Potential to Achieve Goals: Good    Frequency Min 3X/week     Co-evaluation               AM-PAC PT "6 Clicks" Mobility  Outcome Measure Help needed turning from your back to your side while in a flat bed without using bedrails?: A Little Help needed moving from lying on your back to sitting on the  side of a flat bed without using bedrails?: A Little Help needed moving to and from a bed to a chair (including a wheelchair)?: A Little Help needed standing up from a chair using your arms (e.g., wheelchair or bedside chair)?: A Little Help needed to walk in hospital room?: A Little Help needed climbing 3-5 steps with a railing? : A Lot 6 Click Score: 17    End of Session Equipment Utilized During Treatment: Gait belt Activity Tolerance: Patient tolerated treatment well Patient left: in chair;with call bell/phone within reach;with chair alarm set Nurse Communication: Mobility status PT Visit Diagnosis: Difficulty in walking, not elsewhere classified (R26.2)    Time: 6045-4098 PT Time Calculation (min) (ACUTE ONLY): 19 min   Charges:   PT Evaluation $PT Eval Low Complexity: 1 Low        Kati PT, DPT Physical Therapist Acute Rehabilitation Services Preferred contact method: Secure Chat Weekend Pager Only: [phone] Office: [place] 06/04/2022, 1:38 PM

## 2022-06-05 ENCOUNTER — Encounter (HOSPITAL_COMMUNITY): Payer: Self-pay | Admitting: Gastroenterology

## 2022-06-05 ENCOUNTER — Telehealth: Payer: Self-pay | Admitting: Internal Medicine

## 2022-06-05 DIAGNOSIS — N390 Urinary tract infection, site not specified: Secondary | ICD-10-CM

## 2022-06-05 DIAGNOSIS — K625 Hemorrhage of anus and rectum: Secondary | ICD-10-CM

## 2022-06-05 DIAGNOSIS — B9620 Unspecified Escherichia coli [E. coli] as the cause of diseases classified elsewhere: Secondary | ICD-10-CM

## 2022-06-05 DIAGNOSIS — Z8673 Personal history of transient ischemic attack (TIA), and cerebral infarction without residual deficits: Secondary | ICD-10-CM

## 2022-06-05 LAB — COMPREHENSIVE METABOLIC PANEL
ALT: 22 U/L (ref 0–44)
AST: 22 U/L (ref 15–41)
Albumin: 3 g/dL — ABNORMAL LOW (ref 3.5–5.0)
Alkaline Phosphatase: 67 U/L (ref 38–126)
Anion gap: 6 (ref 5–15)
BUN: 9 mg/dL (ref 8–23)
CO2: 28 mmol/L (ref 22–32)
Calcium: 8.9 mg/dL (ref 8.9–10.3)
Chloride: 109 mmol/L (ref 98–111)
Creatinine, Ser: 0.67 mg/dL (ref 0.44–1.00)
GFR, Estimated: >60 mL/min (ref >60)
Glucose, Bld: 108 mg/dL — ABNORMAL HIGH (ref 70–99)
Potassium: 3.2 mmol/L — ABNORMAL LOW (ref 3.5–5.1)
Sodium: 143 mmol/L (ref 135–145)
Total Bilirubin: 0.7 mg/dL (ref 0.3–1.2)
Total Protein: 5.6 g/dL — ABNORMAL LOW (ref 6.5–8.1)

## 2022-06-05 LAB — MAGNESIUM: Magnesium: 1.9 mg/dL (ref 1.7–2.4)

## 2022-06-05 LAB — CBC WITH DIFFERENTIAL/PLATELET
Abs Immature Granulocytes: 0.01 10*3/uL (ref 0.00–0.07)
Basophils Absolute: 0 10*3/uL (ref 0.0–0.1)
Basophils Relative: 1 %
Eosinophils Absolute: 0.4 10*3/uL (ref 0.0–0.5)
Eosinophils Relative: 6 %
HCT: 28.5 % — ABNORMAL LOW (ref 36.0–46.0)
Hemoglobin: 8.6 g/dL — ABNORMAL LOW (ref 12.0–15.0)
Immature Granulocytes: 0 %
Lymphocytes Relative: 26 %
Lymphs Abs: 1.5 10*3/uL (ref 0.7–4.0)
MCH: 28.6 pg (ref 26.0–34.0)
MCHC: 30.2 g/dL (ref 30.0–36.0)
MCV: 94.7 fL (ref 80.0–100.0)
Monocytes Absolute: 0.5 10*3/uL (ref 0.1–1.0)
Monocytes Relative: 8 %
Neutro Abs: 3.5 10*3/uL (ref 1.7–7.7)
Neutrophils Relative %: 59 %
Platelets: 284 10*3/uL (ref 150–400)
RBC: 3.01 MIL/uL — ABNORMAL LOW (ref 3.87–5.11)
RDW: 20.1 % — ABNORMAL HIGH (ref 11.5–15.5)
WBC: 6 10*3/uL (ref 4.0–10.5)
nRBC: 0 % (ref 0.0–0.2)

## 2022-06-05 LAB — PHOSPHORUS: Phosphorus: 4 mg/dL (ref 2.5–4.6)

## 2022-06-05 LAB — ERYTHROPOIETIN: Erythropoietin: 33.8 m[IU]/mL — ABNORMAL HIGH (ref 2.6–18.5)

## 2022-06-05 MED ORDER — FERROUS SULFATE 325 (65 FE) MG PO TABS: 325.0000 mg | ORAL_TABLET | Freq: Every day | ORAL | 0 refills | Status: DC

## 2022-06-05 MED ORDER — PANTOPRAZOLE SODIUM 40 MG PO TBEC: 40.0000 mg | DELAYED_RELEASE_TABLET | Freq: Two times a day (BID) | ORAL | 0 refills | Status: DC

## 2022-06-05 MED ORDER — POTASSIUM CHLORIDE CRYS ER 20 MEQ PO TBCR
50.0000 meq | EXTENDED_RELEASE_TABLET | Freq: Once | ORAL | Status: DC
Start: 2022-06-05 — End: 2022-06-05
  Filled 2022-06-05: qty 1

## 2022-06-05 MED ORDER — HEPARIN SOD (PORK) LOCK FLUSH 100 UNIT/ML IV SOLN
500.0000 [IU] | INTRAVENOUS | Status: AC | PRN
  Administered 2022-06-05: 500 [IU]

## 2022-06-05 MED ORDER — FOLIC ACID 1 MG PO TABS: 2.0000 mg | ORAL_TABLET | Freq: Every day | ORAL | 0 refills | Status: DC

## 2022-06-05 MED ORDER — CEFDINIR 300 MG PO CAPS
300.0000 mg | ORAL_CAPSULE | Freq: Two times a day (BID) | ORAL | Status: DC
  Administered 2022-06-05: 300 mg via ORAL
  Filled 2022-06-05: qty 1

## 2022-06-05 MED ORDER — CEFDINIR 300 MG PO CAPS: 300.0000 mg | ORAL_CAPSULE | Freq: Two times a day (BID) | ORAL | 0 refills | Status: DC

## 2022-06-05 MED ORDER — ASCORBIC ACID 500 MG PO TABS: 500.0000 mg | ORAL_TABLET | Freq: Every day | ORAL | 0 refills | Status: DC

## 2022-06-05 NOTE — TOC Transition Note (Signed)
Transition of Care [facility]) - CM/SW Discharge Note   Patient Details  Name: Jessica [name] MRN: [id number] Date of Birth: [birth year]  [facility] CM/SW Contact:  Lennart Pall, LCSW Phone Number: 06/05/2022, 12:50 PM   Clinical Narrative:    Notified by RN that pt is medically cleared for dc home today and orders placed for [facility].  Pt requesting [facility] as she has been followed by them previously.  Referral placed and accepted with [facility] for PT/OT/ST/aide.  No further TOC needs.   Final next level of care: [place] Barriers to Discharge: Barriers Resolved   Patient Goals and CMS Choice Patient states their goals for this hospitalization and ongoing recovery are:: to go back to my home CMS Medicare.gov Compare Post Acute Care list provided to:: Patient Choice offered to / list presented to : Patient  Discharge Placement                       Discharge Plan and Services   Discharge Planning Services: CM Consult            DME Arranged: N/A DME Agency: NA       HH Arranged: OT, Speech Therapy, Nurse's Aide HH Agency: [place] Date [facility] Agency Contacted: 06/05/22 Time [place]: [id number] Representative spoke with at [place]: [place] ([place]) Interventions     Readmission Risk Interventions    06/05/2022   12:47 PM  Readmission Risk Prevention Plan  Transportation Screening Complete  PCP or Specialist Appt within 3-5 Days Complete  HRI or [place] Complete  Social Work Consult for [place] Planning/Counseling Complete  Palliative Care Screening Not Applicable  Medication Review Press photographer) Complete

## 2022-06-05 NOTE — Progress Notes (Signed)
Patient and patient's daughter, Jessica [name], have been read and explained discharge instructions. Patient and Jessica [name] have no further questions at this time. Port has been de-accessed by IV team.  Layla Maw, RN

## 2022-06-05 NOTE — Telephone Encounter (Signed)
Patient's daughter, [name], called stating she spoke with patient's oncologist this morning and he is concerned about releasing her from the hospital.  She said patient's hemoglobin is still trending down and she would like to speak with you regarding her plan of care.  Please call patient and advise.  Thank you.

## 2022-06-05 NOTE — Telephone Encounter (Signed)
Pts daughter calling back stating that the hospitalist is planning to discharge pt today. Daughter quite concerned. Let her know the message had been sent to our GI inpt team. Asked if she had voiced her concerns to the hospitalist, she states that she has but was told they cannot justify keeping her mother longer in the hospital. Let her know that the hospitalist is the one that discharges and admits the pts and that insurance sometimes plays a part as to if they can remain hospitalized. Daughter verbalized understanding but was not happy.

## 2022-06-05 NOTE — Telephone Encounter (Signed)
Thank you. Dr. Dia Crawford called here and spoke with me regarding the daughter also. It is not the daughter that has been there the whole time, this daughter just flew in. Dr. Sherral Hammers stated the daughter that has been there was aware of the plan for d/c today and agreeable. He states the pt will be discharged today. Dr. Candis Schatz aware and does not need to go by there now.

## 2022-06-05 NOTE — Telephone Encounter (Signed)
Pts daughter called and states she is hearing that her mom may be discharged today. Reports she spoke with Dr. Marin Olp and he is concerned about her being released as her Hgb is not trending upward after a blood transfusion and 3 iron infusions. Daughter was tearful on the phone and reports she is terrified to take her home, she is afraid something bad will happen if she goes home as a source for the bleeding was not found. She also expressed disgust with the nursing staff at [facility] not helping her mother complete the colon prep. She reports the prep was placed on the other side of the room and her mother had a stroke in the past and she was not able to get to the prep. States nursing staff did not help her do the prep and her mother would never not do what she is supposed to do for a procedure if she could have gotten to the prep. Let her know inpt team would be notified of her concerns.

## 2022-06-05 NOTE — Discharge Summary (Addendum)
Physician Discharge Summary  Jessica [name] [id number] DOB: Aug 07, 1941 DOA: 05/28/2022  PCP: Jessica Pretty, MD  Admit date: 05/28/2022 Discharge date: 06/05/2022  Time spent: 35 minutes  Recommendations for Outpatient Follow-up:   Acute blood loss anemia Lab Results  Component Value Date   HGB 8.6 (L) 06/05/2022   HGB 9.3 (L) 06/04/2022   HGB 8.9 (L) 06/04/2022   HGB 10.1 (L) 06/03/2022   HGB 9.3 (L) 06/01/2022  -7/27 positive fecal occult blood. -Negative sign of overt bleeding since colonoscopy. -Stable -8/2 s/p colonoscopy -8/3 per Jessica [name] oncology request patient receive IV iron. -8/3 iron dextran IV 500 mg x 1 + vitamin C 500 mg daily - 8/3 iron po 325 mg+ vitamin C 500 mg daily, starting on 8/4 -8/3 per GI nurse Jessica [name] order and reminder in for CBC. Pt scheduled to see Jessica [name] 8/23'@9am'$ . Appt letter mailed to pt.   Suspected GI bleeding: -Patient on aspirin Plavix for acute CVA in May,now with acute blood loss anemia,  -Hemoccult positive dark stool suspecting GI bleeding.  -7/28 S/P EGD-Schatzki ring, hiatal hernia gastric diverticulum without bleeding -S/p Colonoscopy on Tuesday after Plavix washout, continue clear liquid diet until Monday, cont ppi bid, serial h/h  s/p ferraheme iv.   -ok to restart ASA -See acute blood loss anemia  positive Staph aureus/E. Coli UTI -Complete 5-day course of antibiotics   Non-compliance with treatment -r/o UTI- culture pending-- started rocephin -gentle IVF until eating better -delirium precautions   Essential HTN -Amlodipine 10 mg daily -Hold HCTZ - PCP to determine when/if to restart additional BP medication   Dyslipidemia: -Atorvastatin 80 mg daily    GERD -cont ppi   Acquired hypothyroidism:  - Synthroid 75 mcg daily   Depression with anxiety:  -Wellbutrin 150 mg daily - Neurontin 100 mg qhs -Effexor XR 225 mg daily    Hypokalemia: -Potassium goal>4 -8/3 K-Dur 50 mEq prior to discharge    Class I Obesity: (BMI 30.03 kg/m.) -Will benefit with PCP follow-up, weight loss  healthy lifestyle and outpatient follow-up  Hx CVA - Residual right-sided weakness stable.      Discharge Diagnoses:  Principal Problem:   Acute lower GI bleeding Active Problems:   Essential hypertension - well controlled   Dyslipidemia, goal LDL below 160   GERD (gastroesophageal reflux disease)   Acquired hypothyroidism   Depression with anxiety   Hypokalemia   Left thalamic infarction (HCC)   Acute blood loss anemia   Iron deficiency anemia due to chronic blood loss   History of stroke in adulthood   Discharge Condition: Stable Diet recommendation: Regular  Filed Weights   06/03/22 1254 06/04/22 0500 06/05/22 0500  Weight: 79.8 kg 78.4 kg 78.6 kg    History of present illness:  [name] PMHx  left breast invasive ductal carcinoma triple negative, history of staph wound  infection of the left axilla/cellulitis of the left breast followed by Jessica [name] S/P cycle #4 completed adjuvant therapy 09/09/2017, Hx  CVA in May with residual expressive aphasia and right hemiparesis placed on aspirin and Plavix, HTN, HLD, GERD    Presented having dark stool past week and a half and blood work showed anemia, Hemoccult positive GI was consulted 1 unit PRBC transfused and patient was admitted for further management . 7/28-underwent EGD-Schatzki ring, hiatal hernia gastric diverticulum without bleeding-advised colonoscopy on Wednesday after Plavix washout, continue clear liquid diet.    Hospital Course:  See above  Procedures: 8/2 colonoscopy    Consultations:  GI Oncology Dr. Burney [name]   Cultures  8/1 urine positive Staph aureus/E. coli  Antibiotics Anti-infectives (From admission, onward)    Start     Dose/Rate Route Frequency Ordered Stop   06/05/22 1000  cefdinir (OMNICEF) capsule 300 mg        300 mg Oral Every 12 hours 06/05/22 0850     06/05/22 0000  cefdinir (OMNICEF) 300 MG  capsule        300 mg Oral Every 12 hours 06/05/22 0900     06/02/22 1000  cefTRIAXone (ROCEPHIN) 1 g in sodium chloride 0.9 % 100 mL IVPB  Status:  Discontinued       Note to Pharmacy: Start after urine culture sent   1 g 200 mL/hr over 30 Minutes Intravenous Every 24 hours 06/02/22 0715 06/05/22 0850         Discharge Exam: Vitals:   06/04/22 1616 06/04/22 1905 06/05/22 0500 06/05/22 0558  BP: (!) 124/59 134/61  136/61  Pulse: 70 74  70  Resp: '18 20  20  '$ Temp: 97.6 F (36.4 C) 98.1 F (36.7 C)  97.7 F (36.5 C)  TempSrc: Oral Oral  Oral  SpO2: 95% 100%  93%  Weight:   78.6 kg   Height:        General: A/O x4, No acute respiratory distress Eyes: negative scleral hemorrhage, negative anisocoria, negative icterus ENT: Negative Runny nose, negative gingival bleeding, Neck:  Negative scars, masses, torticollis, lymphadenopathy, JVD Lungs: Clear to auscultation bilaterally without wheezes or crackles Cardiovascular: Regular rate and rhythm without murmur gallop or rub normal S1 and S2 Abdomen: No abdominal pain to palpation, nondistended, positive soft, bowel sounds, no rebound, no ascites, no appreciable mass   Discharge Instructions   Allergies as of 06/05/2022       Reactions   Advil [ibuprofen] Hives, Itching   Codeine Hives   Penicillins Hives   Has patient had a PCN reaction causing immediate rash, facial/tongue/throat swelling, SOB or lightheadedness with hypotension: yes Has patient had a PCN reaction causing severe rash involving mucus membranes or skin necrosis:  no Has patient had a PCN reaction that required hospitalization:  no Has patient had a PCN reaction occurring within the last 10 years: no If all of the above answers are "NO", then may proceed with Cephalosporin use. Has patient had a PCN reaction causing immediate rash, facial/tongue/throat swelling, SOB or lightheadedness with hypotension: yes Has patient had a PCN reaction causing severe rash  involving mucus membranes or skin necrosis:  no Has patient had a PCN reaction that required hospitalization:  no Has patient had a PCN reaction occurring within the last 10 years: no If all of the above answers are "NO", then may proceed with Cephalosporin use.   Sulfamethoxazole-trimethoprim Hives   Band-aid Plus Antibiotic [bacitracin-polymyxin B] Dermatitis   Benazepril Swelling   Possible cause of tongue swelling   Other Rash   Band-aid        Medication List     STOP taking these medications    hydrochlorothiazide 12.5 MG capsule Commonly known as: MICROZIDE   magnesium gluconate 500 MG tablet Commonly known as: MAGONATE   modafinil 100 MG tablet Commonly known as: PROVIGIL   nitrofurantoin (macrocrystal-monohydrate) 100 MG capsule Commonly known as: MACROBID   omeprazole 40 MG capsule Commonly known as: PRILOSEC       TAKE these medications    acetaminophen 325 MG tablet Commonly known as: TYLENOL Take 2 tablets (650 mg total) by  mouth every 6 (six) hours as needed for mild pain (or Fever >/= 101).   amLODipine 10 MG tablet Commonly known as: NORVASC Take 1 tablet (10 mg total) by mouth daily.   ascorbic acid 500 MG tablet Commonly known as: VITAMIN C Take 1 tablet (500 mg total) by mouth daily.   aspirin 81 MG chewable tablet Chew 1 tablet (81 mg total) by mouth daily. What changed: Another medication with the same name was removed. Continue taking this medication, and follow the directions you see here.   atorvastatin 80 MG tablet Commonly known as: LIPITOR Take 1 tablet (80 mg total) by mouth at bedtime. What changed: when to take this   buPROPion 150 MG 24 hr tablet Commonly known as: WELLBUTRIN XL Take 1 tablet (150 mg total) by mouth every morning. What changed: when to take this   cefdinir 300 MG capsule Commonly known as: OMNICEF Take 1 capsule (300 mg total) by mouth every 12 (twelve) hours.   cholecalciferol 25 MCG (1000 UNIT)  tablet Commonly known as: VITAMIN D3 Take 1 tablet (1,000 Units total) by mouth daily.   clopidogrel 75 MG tablet Commonly known as: PLAVIX 1 tablet daily through 06/24/2022 and stop What changed:  how much to take how to take this when to take this additional instructions   diclofenac Sodium 1 % Gel Commonly known as: VOLTAREN Apply 2 g topically 4 (four) times daily as needed (shoulder pain).   ferrous sulfate 325 (65 FE) MG tablet Take 1 tablet (325 mg total) by mouth daily with breakfast.   folic acid 1 MG tablet Commonly known as: FOLVITE Take 2 tablets (2 mg total) by mouth daily.   gabapentin 100 MG capsule Commonly known as: NEURONTIN Take 100 mg by mouth at bedtime.   levothyroxine 75 MCG tablet Commonly known as: SYNTHROID Take 1 tablet (75 mcg total) by mouth daily.   OCUVITE ADULT FORMULA PO Take 1 tablet by mouth daily.   pantoprazole 40 MG tablet Commonly known as: PROTONIX Take 1 tablet (40 mg total) by mouth 2 (two) times daily. What changed: when to take this   polyethylene glycol 17 g packet Commonly known as: MIRALAX / GLYCOLAX Take 17 g by mouth daily. What changed:  when to take this reasons to take this   potassium chloride 10 MEQ tablet Commonly known as: KLOR-CON M Take 1 tablet (10 mEq total) by mouth 3 (three) times daily.   venlafaxine XR 75 MG 24 hr capsule Commonly known as: EFFEXOR-XR Take 3 capsules (225 mg total) by mouth daily.       Allergies  Allergen Reactions   Advil [Ibuprofen] Hives and Itching   Codeine Hives   Penicillins Hives    Has patient had a PCN reaction causing immediate rash, facial/tongue/throat swelling, SOB or lightheadedness with hypotension: yes Has patient had a PCN reaction causing severe rash involving mucus membranes or skin necrosis:  no Has patient had a PCN reaction that required hospitalization:  no Has patient had a PCN reaction occurring within the last 10 years: no If all of the above  answers are "NO", then may proceed with Cephalosporin use. Has patient had a PCN reaction causing immediate rash, facial/tongue/throat swelling, SOB or lightheadedness with hypotension: yes Has patient had a PCN reaction causing severe rash involving mucus membranes or skin necrosis:  no Has patient had a PCN reaction that required hospitalization:  no Has patient had a PCN reaction occurring within the last 10 years: no If  all of the above answers are "NO", then may proceed with Cephalosporin use.    Sulfamethoxazole-Trimethoprim Hives   Band-Aid Plus Antibiotic [Bacitracin-Polymyxin B] Dermatitis   Benazepril Swelling    Possible cause of tongue swelling   Other Rash    Band-aid      The results of significant diagnostics from this hospitalization (including imaging, microbiology, ancillary and laboratory) are listed below for reference.    Significant Diagnostic Studies: No results found.  Microbiology: Recent Results (from the past 240 hour(s))  Urine Culture     Status: Abnormal   Collection Time: 06/02/22  9:55 AM   Specimen: Urine, Clean Catch  Result Value Ref Range Status   Specimen Description   Final    URINE, CLEAN CATCH Performed at [facility], [place] [address] [place], [place] [address]    Special Requests   Final    NONE Performed at [facility], [place] [address] [place], [place] [address]    Culture (A)  Final    >=100,000 COLONIES/mL ESCHERICHIA COLI >=100,000 COLONIES/mL STAPHYLOCOCCUS AUREUS    Report Status 06/04/2022 FINAL  Final   Organism ID, Bacteria STAPHYLOCOCCUS AUREUS (A)  Final   Organism ID, Bacteria ESCHERICHIA COLI (A)  Final      Susceptibility   Escherichia coli - MIC*    AMPICILLIN <=2 SENSITIVE Sensitive     CEFAZOLIN <=4 SENSITIVE Sensitive     CEFEPIME <=0.12 SENSITIVE Sensitive     CEFTRIAXONE <=0.25 SENSITIVE Sensitive     CIPROFLOXACIN >=4 RESISTANT Resistant     GENTAMICIN <=1 SENSITIVE  Sensitive     IMIPENEM <=0.25 SENSITIVE Sensitive     NITROFURANTOIN <=16 SENSITIVE Sensitive     TRIMETH/SULFA <=20 SENSITIVE Sensitive     AMPICILLIN/SULBACTAM <=2 SENSITIVE Sensitive     PIP/TAZO <=4 SENSITIVE Sensitive     * >=100,000 COLONIES/mL ESCHERICHIA COLI   Staphylococcus aureus - MIC*    CIPROFLOXACIN <=0.5 SENSITIVE Sensitive     GENTAMICIN <=0.5 SENSITIVE Sensitive     NITROFURANTOIN <=16 SENSITIVE Sensitive     OXACILLIN 0.5 SENSITIVE Sensitive     TETRACYCLINE <=1 SENSITIVE Sensitive     VANCOMYCIN 1 SENSITIVE Sensitive     TRIMETH/SULFA <=10 SENSITIVE Sensitive     CLINDAMYCIN RESISTANT Resistant     RIFAMPIN <=0.5 SENSITIVE Sensitive     Inducible Clindamycin POSITIVE Resistant     * >=100,000 COLONIES/mL STAPHYLOCOCCUS AUREUS     Labs: Basic Metabolic Panel: Recent Labs  Lab 05/30/22 0700 06/01/22 0338 06/03/22 0440 06/04/22 0335 06/05/22 0231  NA 140 142 143 143 143  K 3.3* 3.3* 3.4* 3.6 3.2*  CL 108 109 108 108 109  CO2 '25 25 26 27 28  '$ GLUCOSE 149* 89 115* 101* 108*  BUN 9 6* 7* 5* 9  CREATININE 0.58 0.51 0.63 0.50 0.67  CALCIUM 9.2 9.2 9.3 8.8* 8.9  MG  --   --   --  1.7 1.9  PHOS  --   --   --  3.5 4.0   Liver Function Tests: Recent Labs  Lab 06/04/22 0335 06/05/22 0231  AST 21 22  ALT 22 22  ALKPHOS 68 67  BILITOT 0.6 0.7  PROT 5.7* 5.6*  ALBUMIN 3.1* 3.0*   No results for input(s): "LIPASE", "AMYLASE" in the last 168 hours. No results for input(s): "AMMONIA" in the last 168 hours. CBC: Recent Labs  Lab 05/31/22 0405 06/01/22 0338 06/03/22 0440 06/04/22 0335 06/04/22 1102 06/05/22 0231  WBC  6.6 9.4 7.8 5.9  --  6.0  NEUTROABS  --   --   --  3.7  --  3.5  HGB 8.7* 9.3* 10.1* 8.9* 9.3* 8.6*  HCT 28.3* 31.2* 33.4* 29.0* 30.1* 28.5*  MCV 90.4 91.2 92.0 92.9  --  94.7  PLT 324 354 360 284  --  284   Cardiac Enzymes: No results for input(s): "CKTOTAL", "CKMB", "CKMBINDEX", "TROPONINI" in the last 168 hours. BNP: BNP (last 3  results) No results for input(s): "BNP" in the last 8760 hours.  ProBNP (last 3 results) No results for input(s): "PROBNP" in the last 8760 hours.  CBG: No results for input(s): "GLUCAP" in the last 168 hours.     Signed:  Dia Crawford, MD [facility]

## 2022-06-05 NOTE — Care Management Important Message (Signed)
Important Message  Patient Details  Name: Jessica [name] MRN: [id number] Date of Birth: [birth year]   Medicare Important Message Given:  Yes     Memory Argue 06/05/2022, 1:16 PM

## 2022-06-07 ENCOUNTER — Encounter (HOSPITAL_BASED_OUTPATIENT_CLINIC_OR_DEPARTMENT_OTHER): Payer: Medicare Other | Admitting: Internal Medicine

## 2022-06-09 DIAGNOSIS — E876 Hypokalemia: Secondary | ICD-10-CM | POA: Diagnosis not present

## 2022-06-09 DIAGNOSIS — N390 Urinary tract infection, site not specified: Secondary | ICD-10-CM | POA: Diagnosis not present

## 2022-06-09 DIAGNOSIS — Z8673 Personal history of transient ischemic attack (TIA), and cerebral infarction without residual deficits: Secondary | ICD-10-CM | POA: Diagnosis not present

## 2022-06-09 DIAGNOSIS — D62 Acute posthemorrhagic anemia: Secondary | ICD-10-CM | POA: Diagnosis not present

## 2022-06-09 DIAGNOSIS — Z09 Encounter for follow-up examination after completed treatment for conditions other than malignant neoplasm: Secondary | ICD-10-CM | POA: Diagnosis not present

## 2022-06-09 DIAGNOSIS — E611 Iron deficiency: Secondary | ICD-10-CM | POA: Diagnosis not present

## 2022-06-09 NOTE — Progress Notes (Signed)
Jessica [name], The two polyps which I removed during your recent procedure were proven to be completely benign but are considered "pre-cancerous" polyps that MAY have grown into cancer if they had not been removed.  Studies shows that at least 20% of women over age 81 and 30% of men over age 29 have pre-cancerous polyps.   Because of your age, comorbidities and lack of high risk polyps, I recommend against any further colon cancer screening.   As discussed, if your blood counts drop again, I would recommend a capsule endoscopy for evaluation of occult GI blood loss  If you develop any new rectal bleeding, abdominal pain or significant bowel habit changes, please contact me before then.

## 2022-06-10 ENCOUNTER — Encounter (HOSPITAL_COMMUNITY): Payer: Self-pay | Admitting: Emergency Medicine

## 2022-06-10 ENCOUNTER — Other Ambulatory Visit: Payer: Self-pay

## 2022-06-10 ENCOUNTER — Emergency Department (HOSPITAL_COMMUNITY): Payer: Medicare Other

## 2022-06-10 ENCOUNTER — Inpatient Hospital Stay (HOSPITAL_COMMUNITY)
Admission: EM | Admit: 2022-06-10 | Discharge: 2022-06-13 | DRG: 391 | Disposition: A | Discharge status: Home Health | Payer: Medicare Other | Attending: Internal Medicine | Admitting: Internal Medicine

## 2022-06-10 DIAGNOSIS — Z803 Family history of malignant neoplasm of breast: Secondary | ICD-10-CM | POA: Diagnosis not present

## 2022-06-10 DIAGNOSIS — N3289 Other specified disorders of bladder: Secondary | ICD-10-CM | POA: Diagnosis not present

## 2022-06-10 DIAGNOSIS — Z9221 Personal history of antineoplastic chemotherapy: Secondary | ICD-10-CM | POA: Diagnosis not present

## 2022-06-10 DIAGNOSIS — E785 Hyperlipidemia, unspecified: Secondary | ICD-10-CM | POA: Diagnosis present

## 2022-06-10 DIAGNOSIS — K922 Gastrointestinal hemorrhage, unspecified: Secondary | ICD-10-CM | POA: Diagnosis not present

## 2022-06-10 DIAGNOSIS — Z8249 Family history of ischemic heart disease and other diseases of the circulatory system: Secondary | ICD-10-CM

## 2022-06-10 DIAGNOSIS — Z923 Personal history of irradiation: Secondary | ICD-10-CM

## 2022-06-10 DIAGNOSIS — G9341 Metabolic encephalopathy: Secondary | ICD-10-CM | POA: Diagnosis present

## 2022-06-10 DIAGNOSIS — I10 Essential (primary) hypertension: Secondary | ICD-10-CM | POA: Diagnosis not present

## 2022-06-10 DIAGNOSIS — Z90710 Acquired absence of both cervix and uterus: Secondary | ICD-10-CM | POA: Diagnosis not present

## 2022-06-10 DIAGNOSIS — F418 Other specified anxiety disorders: Secondary | ICD-10-CM | POA: Diagnosis present

## 2022-06-10 DIAGNOSIS — I4510 Unspecified right bundle-branch block: Secondary | ICD-10-CM | POA: Diagnosis present

## 2022-06-10 DIAGNOSIS — K219 Gastro-esophageal reflux disease without esophagitis: Secondary | ICD-10-CM | POA: Diagnosis not present

## 2022-06-10 DIAGNOSIS — K5909 Other constipation: Principal | ICD-10-CM | POA: Diagnosis present

## 2022-06-10 DIAGNOSIS — N3000 Acute cystitis without hematuria: Secondary | ICD-10-CM

## 2022-06-10 DIAGNOSIS — K5900 Constipation, unspecified: Secondary | ICD-10-CM | POA: Diagnosis not present

## 2022-06-10 DIAGNOSIS — E039 Hypothyroidism, unspecified: Secondary | ICD-10-CM | POA: Diagnosis not present

## 2022-06-10 DIAGNOSIS — F32A Depression, unspecified: Secondary | ICD-10-CM | POA: Diagnosis not present

## 2022-06-10 DIAGNOSIS — Z801 Family history of malignant neoplasm of trachea, bronchus and lung: Secondary | ICD-10-CM

## 2022-06-10 DIAGNOSIS — Z853 Personal history of malignant neoplasm of breast: Secondary | ICD-10-CM

## 2022-06-10 DIAGNOSIS — Z66 Do not resuscitate: Secondary | ICD-10-CM | POA: Diagnosis not present

## 2022-06-10 DIAGNOSIS — N390 Urinary tract infection, site not specified: Secondary | ICD-10-CM | POA: Diagnosis present

## 2022-06-10 DIAGNOSIS — G9340 Encephalopathy, unspecified: Secondary | ICD-10-CM | POA: Diagnosis not present

## 2022-06-10 DIAGNOSIS — F419 Anxiety disorder, unspecified: Secondary | ICD-10-CM | POA: Diagnosis present

## 2022-06-10 DIAGNOSIS — Z8673 Personal history of transient ischemic attack (TIA), and cerebral infarction without residual deficits: Secondary | ICD-10-CM | POA: Diagnosis not present

## 2022-06-10 DIAGNOSIS — I69351 Hemiplegia and hemiparesis following cerebral infarction affecting right dominant side: Secondary | ICD-10-CM

## 2022-06-10 DIAGNOSIS — R4182 Altered mental status, unspecified: Principal | ICD-10-CM

## 2022-06-10 DIAGNOSIS — Z8049 Family history of malignant neoplasm of other genital organs: Secondary | ICD-10-CM | POA: Diagnosis not present

## 2022-06-10 LAB — TSH: TSH: 0.392 u[IU]/mL (ref 0.350–4.500)

## 2022-06-10 LAB — URINALYSIS, ROUTINE W REFLEX MICROSCOPIC
Bilirubin Urine: NEGATIVE
Glucose, UA: NEGATIVE mg/dL
Hgb urine dipstick: NEGATIVE
Ketones, ur: NEGATIVE mg/dL
Nitrite: NEGATIVE
Protein, ur: NEGATIVE mg/dL
Specific Gravity, Urine: 1.017 (ref 1.005–1.030)
pH: 6 (ref 5.0–8.0)

## 2022-06-10 LAB — CBC WITH DIFFERENTIAL/PLATELET
Abs Immature Granulocytes: 0.01 10*3/uL (ref 0.00–0.07)
Basophils Absolute: 0.1 10*3/uL (ref 0.0–0.1)
Basophils Relative: 1 %
Eosinophils Absolute: 0.4 10*3/uL (ref 0.0–0.5)
Eosinophils Relative: 7 %
HCT: 34.8 % — ABNORMAL LOW (ref 36.0–46.0)
Hemoglobin: 10.7 g/dL — ABNORMAL LOW (ref 12.0–15.0)
Immature Granulocytes: 0 %
Lymphocytes Relative: 21 %
Lymphs Abs: 1.1 10*3/uL (ref 0.7–4.0)
MCH: 29.3 pg (ref 26.0–34.0)
MCHC: 30.7 g/dL (ref 30.0–36.0)
MCV: 95.3 fL (ref 80.0–100.0)
Monocytes Absolute: 0.5 10*3/uL (ref 0.1–1.0)
Monocytes Relative: 9 %
Neutro Abs: 3.3 10*3/uL (ref 1.7–7.7)
Neutrophils Relative %: 62 %
Platelets: 308 10*3/uL (ref 150–400)
RBC: 3.65 MIL/uL — ABNORMAL LOW (ref 3.87–5.11)
RDW: 23.2 % — ABNORMAL HIGH (ref 11.5–15.5)
WBC: 5.3 10*3/uL (ref 4.0–10.5)
nRBC: 0 % (ref 0.0–0.2)

## 2022-06-10 LAB — LIPASE, BLOOD: Lipase: 46 U/L (ref 11–51)

## 2022-06-10 LAB — RAPID URINE DRUG SCREEN, HOSP PERFORMED
Amphetamines: NOT DETECTED
Barbiturates: NOT DETECTED
Benzodiazepines: NOT DETECTED
Cocaine: NOT DETECTED
Opiates: NOT DETECTED
Tetrahydrocannabinol: NOT DETECTED

## 2022-06-10 LAB — COMPREHENSIVE METABOLIC PANEL
ALT: 36 U/L (ref 0–44)
AST: 27 U/L (ref 15–41)
Albumin: 3.6 g/dL (ref 3.5–5.0)
Alkaline Phosphatase: 75 U/L (ref 38–126)
Anion gap: 8 (ref 5–15)
BUN: 10 mg/dL (ref 8–23)
CO2: 27 mmol/L (ref 22–32)
Calcium: 9.4 mg/dL (ref 8.9–10.3)
Chloride: 105 mmol/L (ref 98–111)
Creatinine, Ser: 0.64 mg/dL (ref 0.44–1.00)
GFR, Estimated: >60 mL/min (ref >60)
Glucose, Bld: 116 mg/dL — ABNORMAL HIGH (ref 70–99)
Potassium: 3.7 mmol/L (ref 3.5–5.1)
Sodium: 140 mmol/L (ref 135–145)
Total Bilirubin: 0.5 mg/dL (ref 0.3–1.2)
Total Protein: 6.9 g/dL (ref 6.5–8.1)

## 2022-06-10 LAB — TYPE AND SCREEN
ABO/RH(D): A POS
Antibody Screen: NEGATIVE

## 2022-06-10 LAB — VITAMIN B12: Vitamin B-12: 572 pg/mL (ref 180–914)

## 2022-06-10 LAB — LACTIC ACID, PLASMA: Lactic Acid, Venous: 1.1 mmol/L (ref 0.5–1.9)

## 2022-06-10 LAB — AMMONIA: Ammonia: 19 umol/L (ref 9–35)

## 2022-06-10 LAB — ETHANOL: Alcohol, Ethyl (B): <10 mg/dL (ref <10)

## 2022-06-10 MED ORDER — ACETAMINOPHEN 325 MG PO TABS
650.0000 mg | ORAL_TABLET | Freq: Four times a day (QID) | ORAL | Status: DC | PRN
  Administered 2022-06-11 (×3): 650 mg via ORAL
  Filled 2022-06-10 (×3): qty 2

## 2022-06-10 MED ORDER — ACETAMINOPHEN 650 MG RE SUPP: 650.0000 mg | Freq: Four times a day (QID) | RECTAL | Status: DC | PRN

## 2022-06-10 MED ORDER — AMLODIPINE BESYLATE 10 MG PO TABS
10.0000 mg | ORAL_TABLET | Freq: Every day | ORAL | Status: DC
  Administered 2022-06-11 – 2022-06-13 (×3): 10 mg via ORAL
  Filled 2022-06-10 (×3): qty 1

## 2022-06-10 MED ORDER — SODIUM CHLORIDE 0.9 % IV SOLN
1.0000 g | INTRAVENOUS | Status: DC
  Administered 2022-06-11: 1 g via INTRAVENOUS
  Filled 2022-06-10: qty 10

## 2022-06-10 MED ORDER — PANTOPRAZOLE SODIUM 40 MG PO TBEC
40.0000 mg | DELAYED_RELEASE_TABLET | Freq: Two times a day (BID) | ORAL | Status: DC
  Administered 2022-06-10 – 2022-06-13 (×6): 40 mg via ORAL
  Filled 2022-06-10 (×6): qty 1

## 2022-06-10 MED ORDER — LEVOTHYROXINE SODIUM 75 MCG PO TABS
75.0000 ug | ORAL_TABLET | Freq: Every day | ORAL | Status: DC
Start: 2022-06-11 — End: 2022-06-13
  Administered 2022-06-11 – 2022-06-13 (×3): 75 ug via ORAL
  Filled 2022-06-10 (×3): qty 1

## 2022-06-10 MED ORDER — CLOPIDOGREL BISULFATE 75 MG PO TABS
75.0000 mg | ORAL_TABLET | Freq: Every day | ORAL | Status: DC
  Administered 2022-06-11 – 2022-06-13 (×3): 75 mg via ORAL
  Filled 2022-06-10 (×3): qty 1

## 2022-06-10 MED ORDER — SODIUM CHLORIDE 0.9% FLUSH
3.0000 mL | Freq: Two times a day (BID) | INTRAVENOUS | Status: DC
  Administered 2022-06-10 – 2022-06-12 (×5): 3 mL via INTRAVENOUS

## 2022-06-10 MED ORDER — POLYETHYLENE GLYCOL 3350 17 G PO PACK
17.0000 g | PACK | Freq: Two times a day (BID) | ORAL | Status: DC
Start: 2022-06-10 — End: 2022-06-11
  Administered 2022-06-10 – 2022-06-11 (×2): 17 g via ORAL
  Filled 2022-06-10 (×2): qty 1

## 2022-06-10 MED ORDER — SODIUM CHLORIDE 0.9 % IV SOLN
1.0000 g | Freq: Once | INTRAVENOUS | Status: AC
  Administered 2022-06-10: 1 g via INTRAVENOUS
  Filled 2022-06-10: qty 10

## 2022-06-10 MED ORDER — ENOXAPARIN SODIUM 40 MG/0.4ML IJ SOSY
40.0000 mg | PREFILLED_SYRINGE | INTRAMUSCULAR | Status: DC
  Administered 2022-06-10 – 2022-06-12 (×3): 40 mg via SUBCUTANEOUS
  Filled 2022-06-10 (×3): qty 0.4

## 2022-06-10 MED ORDER — ASPIRIN 81 MG PO CHEW
81.0000 mg | CHEWABLE_TABLET | Freq: Every day | ORAL | Status: DC
  Administered 2022-06-11 – 2022-06-13 (×3): 81 mg via ORAL
  Filled 2022-06-10 (×3): qty 1

## 2022-06-10 MED ORDER — ATORVASTATIN CALCIUM 80 MG PO TABS
80.0000 mg | ORAL_TABLET | Freq: Every day | ORAL | Status: DC
Start: 2022-06-11 — End: 2022-06-13
  Administered 2022-06-11 – 2022-06-12 (×2): 80 mg via ORAL
  Filled 2022-06-10 (×2): qty 1

## 2022-06-10 MED ORDER — VENLAFAXINE HCL ER 75 MG PO CP24
225.0000 mg | ORAL_CAPSULE | Freq: Every day | ORAL | Status: DC
  Administered 2022-06-11 – 2022-06-13 (×3): 225 mg via ORAL
  Filled 2022-06-10: qty 1
  Filled 2022-06-10 (×3): qty 3

## 2022-06-10 MED ORDER — DICLOFENAC SODIUM 1 % EX GEL
2.0000 g | Freq: Four times a day (QID) | CUTANEOUS | Status: DC | PRN
  Filled 2022-06-10 (×2): qty 100

## 2022-06-10 MED ORDER — IOHEXOL 300 MG/ML  SOLN
100.0000 mL | Freq: Once | INTRAMUSCULAR | Status: AC | PRN
  Administered 2022-06-10: 100 mL via INTRAVENOUS

## 2022-06-10 MED ORDER — BUPROPION HCL ER (XL) 150 MG PO TB24
150.0000 mg | ORAL_TABLET | Freq: Every morning | ORAL | Status: DC
  Administered 2022-06-11 – 2022-06-13 (×3): 150 mg via ORAL
  Filled 2022-06-10 (×3): qty 1

## 2022-06-10 MED ORDER — GABAPENTIN 100 MG PO CAPS
100.0000 mg | ORAL_CAPSULE | Freq: Every day | ORAL | Status: DC
Start: 2022-06-10 — End: 2022-06-13
  Administered 2022-06-10 – 2022-06-12 (×3): 100 mg via ORAL
  Filled 2022-06-10 (×3): qty 1

## 2022-06-10 NOTE — ED Provider Notes (Signed)
Blood pressure (!) 163/66, pulse 70, temperature 97.9 F (36.6 C), resp. rate 18, SpO2 96 %.  Assuming care from Dr. Ernesto Rutherford.  In short, Jessica [name] is a 81 y.o. female with a chief complaint of Altered Mental Status .  Refer to the original H&P for additional details.  The current plan of care is to follow up on CT and UA.  06:45 PM Patient's lab work overall is reassuring.  Lactic acid is normal.  I independently evaluated the CT imaging of the head along with abdomen and pelvis and agree with radiology interpretation.  Patient is complaining of abdominal pain and her urine shows moderate leukocytes with 11-20 whites and bacteria.  I do plan to send this for culture.  During her most recent hospitalization her urine culture grew E. Coli and she was treated with antibiotics.  Daughter describes rapid change/decline in her mental status starting yesterday evening.  Unclear if this represents a partially treated urine infection or encephalopathy from other reasons such as constipation or small stroke.  Plan for admit.   Discussed patient's case with [facility], Dr. Myna Hidalgo to request admission. Patient and family (if present) updated with plan.     Margette Fast, MD 06/10/22 2225

## 2022-06-10 NOTE — H&P (Signed)
History and Physical    Jessica [name] XFG:[id number] DOB: 10-25-1941 DOA: 06/10/2022  PCP: [name], MD   Patient coming from: Home   Chief Complaint: Increased fatigue and confusion   HPI: Jessica [name] is a pleasant 81 y.o. female with medical history significant for depression, anxiety, breast cancer, ischemic CVA in May 2023, and recent admission with occult GI bleeding and acute anemia, now presenting to the emergency department for evaluation of lethargy and confusion.  She is accompanied by her daughter who assists with the history.  She was noted to have increased confusion beginning yesterday and has also been wanting to sleep more.  She reported some mild generalized abdominal discomfort and dysuria beginning yesterday.  She has not had a bowel movement since the recent hospital admission.  She denies fevers, chills, chest pain, headache, or acute focal numbness or weakness.  No melena or hematochezia recently.  ED Course: Upon arrival to the ED, patient is found to be afebrile and saturating mid 90s on room air with stable blood pressure.  EKG features sinus rhythm with RBBB.  Hemoglobin has increased to 10.7.  CMP is normal.  Lactic acid is normal.  Urine culture was ordered and patient was started on Rocephin in the ED.  Review of Systems:  All other systems reviewed and apart from HPI, are negative.  Past Medical History:  Diagnosis Date   Allergy    Anemia    Anxiety    Asthma    in past, no inhalers now   Blood transfusion without reported diagnosis    Breast cancer ([place]) 01/2017   left breast    Breast cancer of upper-inner quadrant of left female breast ([place]) 03/11/2017   Cancer ([place]) 01/2017   left breast   Cataract    bilateral removed   Chronic kidney disease    kidney stones   Depression 08/31/2013   Dyslipidemia, goal LDL below 160 08/31/2013   Essential hypertension - well controlled 08/31/2013   Family history of breast cancer    Family  history of melanoma    Family history of prostate cancer    GERD (gastroesophageal reflux disease)    Goals of care, counseling/discussion 03/11/2017   History of radiation therapy 10/13/17-11/11/17   left breast 40.05 Gy in 15 fractions, left breast boost 10 Gy in 5 fractions   Hypothyroidism    Ischemic stroke [facility])    May 2023;   Obesity (BMI 30-39.9) 08/31/2013   Personal history of chemotherapy 2018   Personal history of radiation therapy 2019   Thyroid disease    Vasovagal near-syncope -rare 08/31/2013    Past Surgical History:  Procedure Laterality Date   ABDOMINAL HYSTERECTOMY     total   BREAST BIOPSY Left 02/24/2017    malignant   BREAST LUMPECTOMY Left 03/23/2017   broken fingers     CATARACT EXTRACTION Bilateral    COLONOSCOPY     COLONOSCOPY WITH PROPOFOL N/A 06/03/2022   Procedure: COLONOSCOPY WITH PROPOFOL;  Surgeon: [name], MD;  Location: [facility];  Service: Gastroenterology;  Laterality: N/A;   ESOPHAGOGASTRODUODENOSCOPY (EGD) WITH PROPOFOL N/A 05/29/2022   Procedure: ESOPHAGOGASTRODUODENOSCOPY (EGD) WITH PROPOFOL;  Surgeon: Jerene Bears, MD;  Location: [facility];  Service: Gastroenterology;  Laterality: N/A;   EXCISIONAL HEMORRHOIDECTOMY     IR FLUORO GUIDE PORT INSERTION RIGHT  08/04/2017   IR REMOVAL TUN ACCESS W/ PORT W/O FL MOD SED  08/04/2017   IR US GUIDE VASC ACCESS RIGHT  08/04/2017   IRRIGATION AND DEBRIDEMENT ABSCESS Left 05/18/2017   Procedure: IRRIGATION AND DEBRIDEMENT LEFT AXILLARY ABSCESS;  Surgeon: Michael Boston, MD;  Location: [facility];  Service: General;  Laterality: Left;   kidney stones lithotripsy     POLYPECTOMY  06/03/2022   Procedure: POLYPECTOMY;  Surgeon: [name], MD;  Location: [facility];  Service: Gastroenterology;;   PORTACATH PLACEMENT Right 03/23/2017   Procedure: INSERTION PORT-A-CATH;  Surgeon: Erroll Luna, MD;  Location: [facility];  Service: General;  Laterality: Right;    RADIOACTIVE SEED GUIDED PARTIAL MASTECTOMY WITH AXILLARY SENTINEL LYMPH NODE BIOPSY Left 03/23/2017   Procedure: LEFT BREAST RADIOACTIVE SEED GUIDED PARTIAL MASTECTOMY AND  SENTINEL LYMPH NODE MAPPING;  Surgeon: Erroll Luna, MD;  Location: [facility];  Service: General;  Laterality: Left;   ROTATOR CUFF REPAIR     left    TONSILLECTOMY     WISDOM TOOTH EXTRACTION      Social History:   reports that she has never smoked. She has been exposed to tobacco smoke. She has never used smokeless tobacco. She reports that she does not drink alcohol and does not use drugs.  Allergies  Allergen Reactions   Advil [Ibuprofen] Hives and Itching   Codeine Hives   Penicillins Hives    Has patient had a PCN reaction causing immediate rash, facial/tongue/throat swelling, SOB or lightheadedness with hypotension: yes Has patient had a PCN reaction causing severe rash involving mucus membranes or skin necrosis:  no Has patient had a PCN reaction that required hospitalization:  no Has patient had a PCN reaction occurring within the last 10 years: no If all of the above answers are "NO", then may proceed with Cephalosporin use. Has patient had a PCN reaction causing immediate rash, facial/tongue/throat swelling, SOB or lightheadedness with hypotension: yes Has patient had a PCN reaction causing severe rash involving mucus membranes or skin necrosis:  no Has patient had a PCN reaction that required hospitalization:  no Has patient had a PCN reaction occurring within the last 10 years: no If all of the above answers are "NO", then may proceed with Cephalosporin use.    Sulfamethoxazole-Trimethoprim Hives   Band-Aid Plus Antibiotic [Bacitracin-Polymyxin B] Dermatitis   Benazepril Swelling    Possible cause of tongue swelling   Other Rash    Band-aid    Family History  Problem Relation Age of Onset   Heart disease Mother        Angina   Heart disease Father    Cervical cancer Maternal  Aunt    Heart attack Brother        Died of MI in his mid 25s   Prostate cancer Brother        dx in his early 76s   Breast cancer Paternal Aunt        dx over 2   Bone cancer Cousin        maternal first cousin dx in her 37s-60s   Lung cancer Cousin        paternal first cousin   Colon cancer Neg Hx    Pancreatic cancer Neg Hx    Stomach cancer Neg Hx    Esophageal cancer Neg Hx    Rectal cancer Neg Hx      Prior to Admission medications   Medication Sig Start Date End Date Taking? Authorizing Provider  acetaminophen (TYLENOL) 325 MG tablet Take 2 tablets (650 mg total) by mouth every 6 (six) hours as needed for  mild pain (or Fever >/= 101). 03/30/22  Yes Angiulli, Lavon Paganini, PA-C  amLODipine (NORVASC) 10 MG tablet Take 1 tablet (10 mg total) by mouth daily. 03/30/22  Yes Angiulli, Lavon Paganini, PA-C  ascorbic acid (VITAMIN C) 500 MG tablet Take 1 tablet (500 mg total) by mouth daily. 06/05/22  Yes Allie Bossier, MD  atorvastatin (LIPITOR) 80 MG tablet Take 1 tablet (80 mg total) by mouth at bedtime. Patient taking differently: Take 80 mg by mouth daily. 03/30/22  Yes Angiulli, Lavon Paganini, PA-C  buPROPion (WELLBUTRIN XL) 150 MG 24 hr tablet Take 1 tablet (150 mg total) by mouth every morning. Patient taking differently: Take 150 mg by mouth daily. 03/30/22  Yes Angiulli, Lavon Paganini, PA-C  cholecalciferol (VITAMIN D3) 25 MCG (1000 UNIT) tablet Take 1 tablet (1,000 Units total) by mouth daily. 03/30/22  Yes Angiulli, Lavon Paganini, PA-C  diclofenac Sodium (VOLTAREN) 1 % GEL Apply 2 g topically 4 (four) times daily as needed (shoulder pain). 03/30/22  Yes Angiulli, Lavon Paganini, PA-C  ferrous sulfate 325 (65 FE) MG tablet Take 1 tablet (325 mg total) by mouth daily with breakfast. 06/05/22  Yes Allie Bossier, MD  folic acid (FOLVITE) 1 MG tablet Take 2 tablets (2 mg total) by mouth daily. 06/05/22  Yes Allie Bossier, MD  gabapentin (NEURONTIN) 100 MG capsule Take 100 mg by mouth at bedtime.   Yes [provider]  levothyroxine (SYNTHROID) 75 MCG tablet Take 1 tablet (75 mcg total) by mouth daily. 03/30/22  Yes Angiulli, Lavon Paganini, PA-C  Multiple Vitamins-Minerals (OCUVITE ADULT FORMULA PO) Take 1 tablet by mouth daily.   Yes [provider]  pantoprazole (PROTONIX) 40 MG tablet Take 1 tablet (40 mg total) by mouth 2 (two) times daily. 06/05/22  Yes Allie Bossier, MD  polyethylene glycol (MIRALAX / GLYCOLAX) 17 g packet Take 17 g by mouth daily. Patient taking differently: Take 17 g by mouth daily as needed for mild constipation. 03/24/22  Yes Danford, Suann Larry, MD  potassium chloride (KLOR-CON M) 10 MEQ tablet Take 1 tablet (10 mEq total) by mouth 3 (three) times daily. 03/30/22  Yes Kirsteins, Luanna Salk, MD  venlafaxine XR (EFFEXOR-XR) 75 MG 24 hr capsule Take 3 capsules (225 mg total) by mouth daily. 03/30/22  Yes Angiulli, Lavon Paganini, PA-C  aspirin 81 MG chewable tablet Chew 1 tablet (81 mg total) by mouth daily. Patient not taking: Reported on 05/28/2022 03/24/22   Edwin Dada, MD  cefdinir (OMNICEF) 300 MG capsule Take 1 capsule (300 mg total) by mouth every 12 (twelve) hours. Patient not taking: Reported on 06/10/2022 06/05/22   Allie Bossier, MD  clopidogrel (PLAVIX) 75 MG tablet 1 tablet daily through 06/24/2022 and stop Patient not taking: Reported on 06/10/2022 03/30/22   Cathlyn Parsons, PA-C    Physical Exam: Vitals:   06/10/22 1805 06/10/22 1806 06/10/22 1830 06/10/22 1900  BP: (!) 169/70  (!) 169/78 (!) 178/66  Pulse: 79 79 77 73  Resp: (!) '21 13 15 19  '$ Temp:      TempSrc:      SpO2: 95% 94% 94% 95%    Constitutional: NAD, calm  Eyes: PERTLA, lids and conjunctivae normal ENMT: Mucous membranes are moist. Posterior pharynx clear of any exudate or lesions.   Neck: supple, no masses  Respiratory: no wheezing, no crackles. No accessory muscle use.  Cardiovascular: S1 & S2 heard, regular rate and rhythm. Trace b/l lower leg edema.  Abdomen: No distension,  no tenderness, soft. Bowel sounds active.  Musculoskeletal: no clubbing / cyanosis. No joint deformity upper and lower extremities.   Skin: no significant rashes, lesions, ulcers. Warm, dry, well-perfused. Neurologic: CN 2-12 grossly intact. Moving all extremities. Alert and oriented to person, place, and situation. Listless.  Psychiatric: Calm. Cooperative.    Labs and Imaging on Admission: I have personally reviewed following labs and imaging studies  CBC: Recent Labs  Lab 06/04/22 0335 06/04/22 1102 06/05/22 0231 06/10/22 1305  WBC 5.9  --  6.0 5.3  NEUTROABS 3.7  --  3.5 3.3  HGB 8.9* 9.3* 8.6* 10.7*  HCT 29.0* 30.1* 28.5* 34.8*  MCV 92.9  --  94.7 95.3  PLT 284  --  284 017   Basic Metabolic Panel: Recent Labs  Lab 06/04/22 0335 06/05/22 0231 06/10/22 1305  NA 143 143 140  K 3.6 3.2* 3.7  CL 108 109 105  CO2 '27 28 27  '$ GLUCOSE 101* 108* 116*  BUN 5* 9 10  CREATININE 0.50 0.67 0.64  CALCIUM 8.8* 8.9 9.4  MG 1.7 1.9  --   PHOS 3.5 4.0  --    GFR: Estimated Creatinine Clearance: 55.7 mL/min (by C-G formula based on SCr of 0.64 mg/dL). Liver Function Tests: Recent Labs  Lab 06/04/22 0335 06/05/22 0231 06/10/22 1305  AST '21 22 27  '$ ALT 22 22 36  ALKPHOS 68 67 75  BILITOT 0.6 0.7 0.5  PROT 5.7* 5.6* 6.9  ALBUMIN 3.1* 3.0* 3.6   Recent Labs  Lab 06/10/22 1556  LIPASE 46   No results for input(s): "AMMONIA" in the last 168 hours. Coagulation Profile: No results for input(s): "INR", "PROTIME" in the last 168 hours. Cardiac Enzymes: No results for input(s): "CKTOTAL", "CKMB", "CKMBINDEX", "TROPONINI" in the last 168 hours. BNP (last 3 results) No results for input(s): "PROBNP" in the last 8760 hours. HbA1C: No results for input(s): "HGBA1C" in the last 72 hours. CBG: No results for input(s): "GLUCAP" in the last 168 hours. Lipid Profile: No results for input(s): "CHOL", "HDL", "LDLCALC", "TRIG", "CHOLHDL", "LDLDIRECT" in the last 72 hours. Thyroid  Function Tests: No results for input(s): "TSH", "T4TOTAL", "FREET4", "T3FREE", "THYROIDAB" in the last 72 hours. Anemia Panel: No results for input(s): "VITAMINB12", "FOLATE", "FERRITIN", "TIBC", "IRON", "RETICCTPCT" in the last 72 hours. Urine analysis:    Component Value Date/Time   COLORURINE YELLOW 06/10/2022 1212   APPEARANCEUR HAZY (A) 06/10/2022 1212   LABSPEC 1.017 06/10/2022 1212   PHURINE 6.0 06/10/2022 1212   GLUCOSEU NEGATIVE 06/10/2022 1212   HGBUR NEGATIVE 06/10/2022 1212   BILIRUBINUR NEGATIVE 06/10/2022 1212   KETONESUR NEGATIVE 06/10/2022 1212   PROTEINUR NEGATIVE 06/10/2022 1212   NITRITE NEGATIVE 06/10/2022 1212   LEUKOCYTESUR MODERATE (A) 06/10/2022 1212   Sepsis Labs: '@LABRCNTIP'$ (procalcitonin:4,lacticidven:4) ) Recent Results (from the past 240 hour(s))  Urine Culture     Status: Abnormal   Collection Time: 06/02/22  9:55 AM   Specimen: Urine, Clean Catch  Result Value Ref Range Status   Specimen Description   Final    URINE, CLEAN CATCH Performed at [facility], [place] [name] [place], [place] [address]    Special Requests   Final    NONE Performed at [facility], [place] [address] [place], [place] [address]    Culture (A)  Final    >=100,000 COLONIES/mL ESCHERICHIA COLI >=100,000 COLONIES/mL STAPHYLOCOCCUS AUREUS    Report Status 06/04/2022 FINAL  Final   Organism ID, Bacteria STAPHYLOCOCCUS  AUREUS (A)  Final   Organism ID, Bacteria ESCHERICHIA COLI (A)  Final      Susceptibility   Escherichia coli - MIC*    AMPICILLIN <=2 SENSITIVE Sensitive     CEFAZOLIN <=4 SENSITIVE Sensitive     CEFEPIME <=0.12 SENSITIVE Sensitive     CEFTRIAXONE <=0.25 SENSITIVE Sensitive     CIPROFLOXACIN >=4 RESISTANT Resistant     GENTAMICIN <=1 SENSITIVE Sensitive     IMIPENEM <=0.25 SENSITIVE Sensitive     NITROFURANTOIN <=16 SENSITIVE Sensitive     TRIMETH/SULFA <=20 SENSITIVE Sensitive     AMPICILLIN/SULBACTAM <=2 SENSITIVE  Sensitive     PIP/TAZO <=4 SENSITIVE Sensitive     * >=100,000 COLONIES/mL ESCHERICHIA COLI   Staphylococcus aureus - MIC*    CIPROFLOXACIN <=0.5 SENSITIVE Sensitive     GENTAMICIN <=0.5 SENSITIVE Sensitive     NITROFURANTOIN <=16 SENSITIVE Sensitive     OXACILLIN 0.5 SENSITIVE Sensitive     TETRACYCLINE <=1 SENSITIVE Sensitive     VANCOMYCIN 1 SENSITIVE Sensitive     TRIMETH/SULFA <=10 SENSITIVE Sensitive     CLINDAMYCIN RESISTANT Resistant     RIFAMPIN <=0.5 SENSITIVE Sensitive     Inducible Clindamycin POSITIVE Resistant     * >=100,000 COLONIES/mL STAPHYLOCOCCUS AUREUS     Radiological Exams on Admission: CT Head Wo Contrast  Result Date: 06/10/2022 CLINICAL DATA:  Altered mental status EXAM: CT HEAD WITHOUT CONTRAST TECHNIQUE: Contiguous axial images were obtained from the base of the skull through the vertex without intravenous contrast. RADIATION DOSE REDUCTION: This exam was performed according to the departmental dose-optimization program which includes automated exposure control, adjustment of the mA and/or kV according to patient size and/or use of iterative reconstruction technique. COMPARISON:  CT head Mar 20, 2022.  MRI head Mar 21, 2022 FINDINGS: Brain: Chronic infarcts in the basal ganglia bilaterally. Mild white matter hypodensity bilaterally. Negative for acute infarct, hemorrhage, mass Vascular: Negative for hyperdense vessel Skull: Negative Sinuses/Orbits: Mucosal edema paranasal sinuses. Bilateral cataract extraction Other: None IMPRESSION: No acute abnormality Chronic infarcts in the basal ganglia bilaterally. Mild chronic microvascular ischemic change in the white matter. Electronically Signed   By: Franchot Gallo M.D.   On: 06/10/2022 17:41   CT Abdomen Pelvis W Contrast  Result Date: 06/10/2022 CLINICAL DATA:  Altered level of consciousness, recent gastrointestinal bleeding EXAM: CT ABDOMEN AND PELVIS WITH CONTRAST TECHNIQUE: Multidetector CT imaging of the abdomen and  pelvis was performed using the standard protocol following bolus administration of intravenous contrast. RADIATION DOSE REDUCTION: This exam was performed according to the departmental dose-optimization program which includes automated exposure control, adjustment of the mA and/or kV according to patient size and/or use of iterative reconstruction technique. CONTRAST:  166m OMNIPAQUE IOHEXOL 300 MG/ML  SOLN COMPARISON:  None Available. FINDINGS: Lower chest: No acute pleural or parenchymal lung disease. Hepatobiliary: No focal liver abnormality is seen. No gallstones, gallbladder wall thickening, or biliary dilatation. Pancreas: Unremarkable. No pancreatic ductal dilatation or surrounding inflammatory changes. Spleen: Normal in size without focal abnormality. Adrenals/Urinary Tract: The kidneys enhance normally and symmetrically. The adrenals are unremarkable. Bladder is decompressed, with no gross abnormality. Stomach/Bowel: No bowel obstruction or ileus. Normal appendix right lower quadrant. Large amount of retained stool within the ascending and transverse colon consistent with constipation. No bowel wall thickening or inflammatory change. Small hiatal hernia. Large diverticulum off the gastric fundus. Vascular/Lymphatic: Aortic atherosclerosis. No enlarged abdominal or pelvic lymph nodes. Reproductive: Status post hysterectomy. No adnexal masses. Other: No free fluid or  free intraperitoneal gas. No abdominal wall hernia. Musculoskeletal: No acute or destructive bony lesions. Reconstructed images demonstrate no additional findings. IMPRESSION: 1. No acute intra-abdominal or intrapelvic process. 2. Significant fecal retention within the proximal colon consistent with constipation. No obstruction or ileus. 3. Small hiatal hernia. 4.  Aortic Atherosclerosis (ICD10-I70.0). Electronically Signed   By: Randa Ngo M.D.   On: 06/10/2022 17:41    EKG: Independently reviewed. Sinus rhythm, RBBB.   Assessment/Plan    1. Acute encephalopathy  - Presents with 1 day of lethargy and increased confusion  - No acute findings on head CT   - Likely secondary to UTI and/or constipation  - She was started on Rocephin in ED  - Continue [place], start bowel regimen, monitor response to treatment    2. UTI  - Pt complains of dysuria, is not septic on admission  - Culture urine, continue Rocephin   3. Constipation  - No BM in close to a week, no obstruction or ileus on CT  - Start bowel regimen   4. Hx of CVA  - Hx of ischemic CVA in May 2023 with residual right-sided deficits - Continue Lipitor, resume DAPT    5. Depression, anxiety  - Continue Wellbutrin and Effexor    DVT prophylaxis: Lovenox  Code Status: DNR Level of Care: Level of care: Telemetry Medical Family Communication: Daughter at bedside  Disposition Plan:  Patient is from: home  Anticipated d/c is to: TBD Anticipated d/c date is: Possibly as early as 06/11/22  Patient currently: Pending improvement in mental status with treatment of UTI and constipation  Consults called: None  Admission status: Observation     Vianne Bulls, MD [facility]  06/10/2022, 8:26 PM

## 2022-06-10 NOTE — ED Notes (Signed)
Patient transported to CT 

## 2022-06-10 NOTE — ED Provider Triage Note (Signed)
Emergency Medicine Provider Triage Evaluation Note  Jessica [name] , a 81 y.o. female  was evaluated in triage.  Pt complains of AMS.Last normal yesterday morning.  Patient evaluated by physician yesterday, does have a prior history of stroke in 2019 with right-sided deficits.  Answering most questions, has not been conversing with daughter.  Family hospitalized at [facility] for GI bleed with decrease hemoglobin. Daughter reports not at baseline since yesterday am.   Review of Systems  Positive: ams Negative: Cough, fever, diarrhea, blood in stool  Physical Exam  BP 139/71 (BP Location: Left Arm)   Pulse 76   Temp 98 F (36.7 C) (Oral)   SpO2 97%  Gen:   Awake, no distress   Resp:  Normal effort  MSK:   Moves extremities without difficulty  Other: Right sided weakness (from prior CVA), slow to response in 1 word responses  Medical Decision Making  Medically screening exam initiated at 12:19 PM.  Appropriate orders placed.  LOANNE EMERY was informed that the remainder of the evaluation will be completed by another provider, this initial triage assessment does not replace that evaluation, and the importance of remaining in the ED until their evaluation is complete.     Janeece Fitting, PA-C 06/10/22 1225

## 2022-06-10 NOTE — ED Provider Notes (Signed)
[facility] EMERGENCY DEPARTMENT Provider Note   CSN: [id number] Arrival date & time: 06/10/22  1212     History {Add pertinent medical, surgical, social history, OB history to HPI:1} Chief Complaint  Patient presents with   Altered Mental Status    Jessica [name] is a 81 y.o. female.  Patient is an 81 year old female with a past medical history of CVA with mild right-sided deficits, hypertension, hyperlipidemia, GERD and recent hospitalization for anemia presenting to the emergency department with altered mental status.  The patient is here with her daughter who states that her mother is less conversant and interactive than usual.  She states that when she was in the hospital for her anemia she was also diagnosed with a UTI and finished the antibiotics on Monday but states that she has not returned to acting like her normal self.  She states that last night she was stating that she could feel a blood clot moving in her right leg and when her daughter offered to call 911 she stated it was just a cramp and did not want to come in.  She denies any fevers or chills.  She states that she has had nausea and decreased appetite but denies any vomiting.  She states that she has had diffuse abdominal pain and constipation for the past week.  She denies any dysuria or hematuria, increased urinary frequency or urgency.  She denies any chest pain or headaches or shortness of breath.  She denies any cough.  The history is provided by the patient.  Altered Mental Status      Home Medications Prior to Admission medications   Medication Sig Start Date End Date Taking? Authorizing Provider  acetaminophen (TYLENOL) 325 MG tablet Take 2 tablets (650 mg total) by mouth every 6 (six) hours as needed for mild pain (or Fever >/= 101). 03/30/22   Angiulli, Lavon Paganini, PA-C  amLODipine (NORVASC) 10 MG tablet Take 1 tablet (10 mg total) by mouth daily. 03/30/22   Angiulli, Lavon Paganini, PA-C   ascorbic acid (VITAMIN C) 500 MG tablet Take 1 tablet (500 mg total) by mouth daily. 06/05/22   Allie Bossier, MD  aspirin 81 MG chewable tablet Chew 1 tablet (81 mg total) by mouth daily. Patient not taking: Reported on 05/28/2022 03/24/22   Edwin Dada, MD  atorvastatin (LIPITOR) 80 MG tablet Take 1 tablet (80 mg total) by mouth at bedtime. Patient taking differently: Take 80 mg by mouth daily. 03/30/22   Angiulli, Lavon Paganini, PA-C  buPROPion (WELLBUTRIN XL) 150 MG 24 hr tablet Take 1 tablet (150 mg total) by mouth every morning. Patient taking differently: Take 150 mg by mouth daily. 03/30/22   Angiulli, Lavon Paganini, PA-C  cefdinir (OMNICEF) 300 MG capsule Take 1 capsule (300 mg total) by mouth every 12 (twelve) hours. 06/05/22   Allie Bossier, MD  cholecalciferol (VITAMIN D3) 25 MCG (1000 UNIT) tablet Take 1 tablet (1,000 Units total) by mouth daily. 03/30/22   Angiulli, Lavon Paganini, PA-C  clopidogrel (PLAVIX) 75 MG tablet 1 tablet daily through 06/24/2022 and stop Patient taking differently: Take 75 mg by mouth daily. 03/30/22   Angiulli, Lavon Paganini, PA-C  diclofenac Sodium (VOLTAREN) 1 % GEL Apply 2 g topically 4 (four) times daily as needed (shoulder pain). 03/30/22   Angiulli, Lavon Paganini, PA-C  ferrous sulfate 325 (65 FE) MG tablet Take 1 tablet (325 mg total) by mouth daily with breakfast. 06/05/22   Allie Bossier, MD  folic acid (FOLVITE) 1 MG tablet Take 2 tablets (2 mg total) by mouth daily. 06/05/22   Allie Bossier, MD  gabapentin (NEURONTIN) 100 MG capsule Take 100 mg by mouth at bedtime.    [provider]  levothyroxine (SYNTHROID) 75 MCG tablet Take 1 tablet (75 mcg total) by mouth daily. 03/30/22   Angiulli, Lavon Paganini, PA-C  Multiple Vitamins-Minerals (OCUVITE ADULT FORMULA PO) Take 1 tablet by mouth daily.    [provider]  pantoprazole (PROTONIX) 40 MG tablet Take 1 tablet (40 mg total) by mouth 2 (two) times daily. 06/05/22   Allie Bossier, MD  polyethylene glycol  (MIRALAX / GLYCOLAX) 17 g packet Take 17 g by mouth daily. Patient taking differently: Take 17 g by mouth daily as needed for mild constipation. 03/24/22   Danford, Suann Larry, MD  potassium chloride (KLOR-CON M) 10 MEQ tablet Take 1 tablet (10 mEq total) by mouth 3 (three) times daily. 03/30/22   Kirsteins, Luanna Salk, MD  venlafaxine XR (EFFEXOR-XR) 75 MG 24 hr capsule Take 3 capsules (225 mg total) by mouth daily. 03/30/22   Angiulli, Lavon Paganini, PA-C      Allergies    Advil [ibuprofen], Codeine, Penicillins, Sulfamethoxazole-trimethoprim, Band-aid plus antibiotic [bacitracin-polymyxin b], Benazepril, and Other    Review of Systems   Review of Systems  Physical Exam Updated Vital Signs BP (!) 147/64   Pulse 67   Temp 98 F (36.7 C) (Oral)   Resp 16   SpO2 96%  Physical Exam Vitals and nursing note reviewed.  Constitutional:      General: She is not in acute distress.    Appearance: Normal appearance.  HENT:     Head: Normocephalic and atraumatic.     Nose: Nose normal.     Mouth/Throat:     Mouth: Mucous membranes are moist.     Pharynx: Oropharynx is clear.  Eyes:     Extraocular Movements: Extraocular movements intact.     Conjunctiva/sclera: Conjunctivae normal.     Pupils: Pupils are equal, round, and reactive to light.  Cardiovascular:     Rate and Rhythm: Normal rate and regular rhythm.     Pulses: Normal pulses.  Pulmonary:     Effort: Pulmonary effort is normal.     Breath sounds: Normal breath sounds.  Abdominal:     General: Abdomen is flat.     Palpations: Abdomen is soft.     Tenderness: There is abdominal tenderness (Diffusely, no rebound or guarding).  Musculoskeletal:        General: No tenderness. Normal range of motion.     Cervical back: Normal range of motion and neck supple.     Right lower leg: No edema.     Left lower leg: No edema.  Skin:    General: Skin is warm and dry.  Neurological:     Mental Status: She is alert and oriented to person,  place, and time. Mental status is at baseline.     Comments: Mild right-sided facial droop, mild dysarthria, mild right upper extremity weakness 4 out of 5, 5 out of 5 in left upper extremity and bilateral lower extremities, sensation intact  Psychiatric:     Comments: Flat affect     ED Results / Procedures / Treatments   Labs (all labs ordered are listed, but only abnormal results are displayed) Labs Reviewed  COMPREHENSIVE METABOLIC PANEL - Abnormal; Notable for the following components:      Result Value  Glucose, Bld 116 (*)    All other components within normal limits  CBC WITH DIFFERENTIAL/PLATELET - Abnormal; Notable for the following components:   RBC 3.65 (*)    Hemoglobin 10.7 (*)    HCT 34.8 (*)    RDW 23.2 (*)    All other components within normal limits  ETHANOL  LACTIC ACID, PLASMA  URINALYSIS, ROUTINE W REFLEX MICROSCOPIC  RAPID URINE DRUG SCREEN, HOSP PERFORMED  LIPASE, BLOOD  CBG MONITORING, ED  TYPE AND SCREEN    EKG EKG Interpretation  Date/Time:  Wednesday June 10 2022 13:23:57 EDT Ventricular Rate:  74 PR Interval:  114 QRS Duration: 144 QT Interval:  444 QTC Calculation: 492 R Axis:   -9 Text Interpretation: Normal sinus rhythm Right bundle branch block Abnormal ECG No significant change since last tracing Confirmed by Oneal Deputy [phone]) on 06/10/2022 3:43:08 PM  Radiology No results found.  Procedures Procedures  {Document cardiac monitor, telemetry assessment procedure when appropriate:1}  Medications Ordered in ED Medications - No data to display  ED Course/ Medical Decision Making/ A&P                           Medical Decision Making Patient is an 81 year old female with a past medical history of CVA with mild right-sided deficits, hypertension, hyperlipidemia, GERD and recent hospitalization for anemia presenting to the emergency department with altered mental status.  Patient was evaluated by PA in triage and had labs and  EKG performed to evaluate for anemia, UTI, electrolyte abnormality, dehydration, ACS or arrhythmia as causes of her mental status change.  The patient right-sided weakness and dysarthria are at her baseline per her daughter and has no signs of new focal neurologic deficits, making ICH or mass effect unlikely.  The patient's labs reviewed and interpreted by myself show an improving hemoglobin and otherwise within normal range.  UA is pending.  The patient is tender on exam for me and will have a CT of her abdomen and pelvis performed as well as a lipase to further evaluate for intra-abdominal infection or SBO or ileus is positive as of her abdominal pain and mental status change.  Amount and/or Complexity of Data Reviewed External Data Reviewed: labs.    Details: Hemoglobin improving from previous lab Radiology: ordered.   ***  {Document critical care time when appropriate:1} {Document review of labs and clinical decision tools ie heart score, Chads2Vasc2 etc:1}  {Document your independent review of radiology images, and any outside records:1} {Document your discussion with family members, caretakers, and with consultants:1} {Document social determinants of health affecting pt's care:1} {Document your decision making why or why not admission, treatments were needed:1} Final Clinical Impression(s) / ED Diagnoses Final diagnoses:  None    Rx / DC Orders ED Discharge Orders     None

## 2022-06-10 NOTE — ED Triage Notes (Signed)
LKW yesterday morning.  Daughter states mom didn't interact with her caregivers as normal yesterday at physician office.  Pt evaluated in triage by EDPA

## 2022-06-11 ENCOUNTER — Observation Stay (HOSPITAL_COMMUNITY): Payer: Medicare Other

## 2022-06-11 DIAGNOSIS — K5909 Other constipation: Secondary | ICD-10-CM | POA: Diagnosis present

## 2022-06-11 DIAGNOSIS — N390 Urinary tract infection, site not specified: Secondary | ICD-10-CM | POA: Diagnosis present

## 2022-06-11 DIAGNOSIS — E039 Hypothyroidism, unspecified: Secondary | ICD-10-CM | POA: Diagnosis present

## 2022-06-11 DIAGNOSIS — R4182 Altered mental status, unspecified: Secondary | ICD-10-CM | POA: Diagnosis present

## 2022-06-11 DIAGNOSIS — F32A Depression, unspecified: Secondary | ICD-10-CM | POA: Diagnosis present

## 2022-06-11 DIAGNOSIS — Z9221 Personal history of antineoplastic chemotherapy: Secondary | ICD-10-CM | POA: Diagnosis not present

## 2022-06-11 DIAGNOSIS — Z803 Family history of malignant neoplasm of breast: Secondary | ICD-10-CM | POA: Diagnosis not present

## 2022-06-11 DIAGNOSIS — F419 Anxiety disorder, unspecified: Secondary | ICD-10-CM | POA: Diagnosis present

## 2022-06-11 DIAGNOSIS — Z801 Family history of malignant neoplasm of trachea, bronchus and lung: Secondary | ICD-10-CM | POA: Diagnosis not present

## 2022-06-11 DIAGNOSIS — Z8249 Family history of ischemic heart disease and other diseases of the circulatory system: Secondary | ICD-10-CM | POA: Diagnosis not present

## 2022-06-11 DIAGNOSIS — G9340 Encephalopathy, unspecified: Secondary | ICD-10-CM | POA: Diagnosis not present

## 2022-06-11 DIAGNOSIS — Z923 Personal history of irradiation: Secondary | ICD-10-CM | POA: Diagnosis not present

## 2022-06-11 DIAGNOSIS — G9341 Metabolic encephalopathy: Secondary | ICD-10-CM | POA: Diagnosis present

## 2022-06-11 DIAGNOSIS — Z66 Do not resuscitate: Secondary | ICD-10-CM | POA: Diagnosis present

## 2022-06-11 DIAGNOSIS — I10 Essential (primary) hypertension: Secondary | ICD-10-CM | POA: Diagnosis present

## 2022-06-11 DIAGNOSIS — Z8049 Family history of malignant neoplasm of other genital organs: Secondary | ICD-10-CM | POA: Diagnosis not present

## 2022-06-11 DIAGNOSIS — Z90710 Acquired absence of both cervix and uterus: Secondary | ICD-10-CM | POA: Diagnosis not present

## 2022-06-11 DIAGNOSIS — K219 Gastro-esophageal reflux disease without esophagitis: Secondary | ICD-10-CM | POA: Diagnosis present

## 2022-06-11 DIAGNOSIS — I69351 Hemiplegia and hemiparesis following cerebral infarction affecting right dominant side: Secondary | ICD-10-CM | POA: Diagnosis not present

## 2022-06-11 DIAGNOSIS — E785 Hyperlipidemia, unspecified: Secondary | ICD-10-CM | POA: Diagnosis present

## 2022-06-11 DIAGNOSIS — Z853 Personal history of malignant neoplasm of breast: Secondary | ICD-10-CM | POA: Diagnosis not present

## 2022-06-11 DIAGNOSIS — I4510 Unspecified right bundle-branch block: Secondary | ICD-10-CM | POA: Diagnosis present

## 2022-06-11 LAB — CBC
HCT: 33.3 % — ABNORMAL LOW (ref 36.0–46.0)
Hemoglobin: 10.3 g/dL — ABNORMAL LOW (ref 12.0–15.0)
MCH: 29.9 pg (ref 26.0–34.0)
MCHC: 30.9 g/dL (ref 30.0–36.0)
MCV: 96.8 fL (ref 80.0–100.0)
Platelets: 282 10*3/uL (ref 150–400)
RBC: 3.44 MIL/uL — ABNORMAL LOW (ref 3.87–5.11)
RDW: 23.2 % — ABNORMAL HIGH (ref 11.5–15.5)
WBC: 7 10*3/uL (ref 4.0–10.5)
nRBC: 0 % (ref 0.0–0.2)

## 2022-06-11 LAB — BASIC METABOLIC PANEL
Anion gap: 11 (ref 5–15)
BUN: 8 mg/dL (ref 8–23)
CO2: 24 mmol/L (ref 22–32)
Calcium: 9.4 mg/dL (ref 8.9–10.3)
Chloride: 103 mmol/L (ref 98–111)
Creatinine, Ser: 0.71 mg/dL (ref 0.44–1.00)
GFR, Estimated: >60 mL/min (ref >60)
Glucose, Bld: 132 mg/dL — ABNORMAL HIGH (ref 70–99)
Potassium: 3.5 mmol/L (ref 3.5–5.1)
Sodium: 138 mmol/L (ref 135–145)

## 2022-06-11 LAB — RPR: RPR Ser Ql: NONREACTIVE

## 2022-06-11 MED ORDER — ORAL CARE MOUTH RINSE: 15.0000 mL | OROMUCOSAL | Status: DC | PRN

## 2022-06-11 MED ORDER — LACTULOSE 10 GM/15ML PO SOLN
30.0000 g | Freq: Once | ORAL | Status: AC
  Administered 2022-06-11: 30 g via ORAL
  Filled 2022-06-11: qty 60

## 2022-06-11 MED ORDER — BISACODYL 10 MG RE SUPP
10.0000 mg | Freq: Once | RECTAL | Status: AC
  Administered 2022-06-11: 10 mg via RECTAL
  Filled 2022-06-11: qty 1

## 2022-06-11 NOTE — Progress Notes (Signed)
PROGRESS NOTE    Jessica [name]  AOZ:[id number] DOB: 13-Feb-1941 DOA: 06/10/2022 PCP: [name], MD    Brief Narrative:  81 year old with history of stroke in 5/23, depression, anxiety, recent admission with blood loss anemia status post upper GI endoscopy and colonoscopy, E. coli UTI and sent home.  Initially did well at home.  Then he started being more confused and tired lethargic so brought back to the hospital.  In the emergency room hemodynamically stable.  On room air.  EKG with chronic right bundle branch block.  Hemoglobin is stable.  Electrolytes normal.  Lactic acid normal.  Urine with mild abnormalities.  CT head without any acute findings.  Admitted with altered mental status.   Assessment & Plan:   Acute metabolic encephalopathy, cause unknown.  Likely persistent delirium, lethargic and deconditioning from recent hospitalization. -Infection workup negative.  UA was abnormal, remains on Rocephin.  However likely completely treated. -Mental status already improving.  CT head was normal.  With recent history of stroke, MRI of the brain was done and that was without any new findings. -Fall precautions.  Work with PT OT.  Bowel regimen.  Constipation: No BM since last week.  Large volume stool in the CT scan of abdomen pelvis.  Aggressive bowel regimen with MiraLAX twice daily and Dulcolax suppository.  Mobilize.  History of stroke: As above.  No evidence of new stroke.  Continue dual antiplatelet therapy.  Depression/anxiety: On Wellbutrin and Effexor.  Continued.   DVT prophylaxis: enoxaparin (LOVENOX) injection 40 mg Start: 06/10/22 1945   Code Status: DNR Family Communication: Daughter at bedside Disposition Plan: Status is: Inpatient Remains inpatient appropriate because: Confused, investigations and inpatient monitoring needed.     Consultants:  None  Procedures:  None  Antimicrobials:  Rocephin 8/9--   Subjective: Patient seen and examined.  No  overnight events.  She is able to communicate, still looks sleepy.  She tells me that she feels okay but tired.  Denies any nausea or vomiting.  Denies any abdominal pain.  Telemetry without events.  Objective: Vitals:   06/11/22 0236 06/11/22 0255 06/11/22 0909 06/11/22 1135  BP: (!) 155/62  (!) 140/63 (!) 156/64  Pulse: 68  70 72  Resp: '16  20 17  '$ Temp: 97.7 F (36.5 C)  98.1 F (36.7 C) 97.9 F (36.6 C)  TempSrc: Oral  Oral Oral  SpO2: 100%  97% 96%  Weight:  77.7 kg    Height:  '5\' 3"'$  (1.6 m)      Intake/Output Summary (Last 24 hours) at 06/11/2022 1514 Last data filed at 06/11/2022 0800 Gross per 24 hour  Intake 480 ml  Output 500 ml  Net -20 ml   Filed Weights   06/11/22 0255  Weight: 77.7 kg    Examination:  General exam: Appears calm and comfortable  Frail and debilitated.  Not in any distress.  Age-appropriate. Respiratory system: Clear to auscultation. Respiratory effort normal. Cardiovascular system: S1 & S2 heard, RRR.  Trace bilateral pedal edema.   Gastrointestinal system: Abdomen is nondistended, soft and nontender. No organomegaly or masses felt. Normal bowel sounds heard. Central nervous system: Alert and oriented. No focal neurological deficits. Generalized weakness.  Anxious.     Data Reviewed: I have personally reviewed following labs and imaging studies  CBC: Recent Labs  Lab 06/05/22 0231 06/10/22 1305 06/11/22 0155  WBC 6.0 5.3 7.0  NEUTROABS 3.5 3.3  --   HGB 8.6* 10.7* 10.3*  HCT 28.5* 34.8* 33.3*  MCV  94.7 95.3 96.8  PLT 284 308 841   Basic Metabolic Panel: Recent Labs  Lab 06/05/22 0231 06/10/22 1305 06/11/22 0155  NA 143 140 138  K 3.2* 3.7 3.5  CL 109 105 103  CO2 '28 27 24  '$ GLUCOSE 108* 116* 132*  BUN '9 10 8  '$ CREATININE 0.67 0.64 0.71  CALCIUM 8.9 9.4 9.4  MG 1.9  --   --   PHOS 4.0  --   --    GFR: Estimated Creatinine Clearance: 55.3 mL/min (by C-G formula based on SCr of 0.71 mg/dL). Liver Function  Tests: Recent Labs  Lab 06/05/22 0231 06/10/22 1305  AST 22 27  ALT 22 36  ALKPHOS 67 75  BILITOT 0.7 0.5  PROT 5.6* 6.9  ALBUMIN 3.0* 3.6   Recent Labs  Lab 06/10/22 1556  LIPASE 46   Recent Labs  Lab 06/10/22 2030  AMMONIA 19   Coagulation Profile: No results for input(s): "INR", "PROTIME" in the last 168 hours. Cardiac Enzymes: No results for input(s): "CKTOTAL", "CKMB", "CKMBINDEX", "TROPONINI" in the last 168 hours. BNP (last 3 results) No results for input(s): "PROBNP" in the last 8760 hours. HbA1C: No results for input(s): "HGBA1C" in the last 72 hours. CBG: No results for input(s): "GLUCAP" in the last 168 hours. Lipid Profile: No results for input(s): "CHOL", "HDL", "LDLCALC", "TRIG", "CHOLHDL", "LDLDIRECT" in the last 72 hours. Thyroid Function Tests: Recent Labs    06/10/22 2030  TSH 0.392   Anemia Panel: Recent Labs    06/10/22 2030  VITAMINB12 572   Sepsis Labs: Recent Labs  Lab 06/10/22 1305  LATICACIDVEN 1.1    Recent Results (from the past 240 hour(s))  Urine Culture     Status: Abnormal   Collection Time: 06/02/22  9:55 AM   Specimen: Urine, Clean Catch  Result Value Ref Range Status   Specimen Description   Final    URINE, CLEAN CATCH Performed at [facility], [place] [address] [place], [place] [address]    Special Requests   Final    NONE Performed at [facility], [place] [address] [place], [place] [address]    Culture (A)  Final    >=100,000 COLONIES/mL ESCHERICHIA COLI >=100,000 COLONIES/mL STAPHYLOCOCCUS AUREUS    Report Status 06/04/2022 FINAL  Final   Organism ID, Bacteria STAPHYLOCOCCUS AUREUS (A)  Final   Organism ID, Bacteria ESCHERICHIA COLI (A)  Final      Susceptibility   Escherichia coli - MIC*    AMPICILLIN <=2 SENSITIVE Sensitive     CEFAZOLIN <=4 SENSITIVE Sensitive     CEFEPIME <=0.12 SENSITIVE Sensitive     CEFTRIAXONE <=0.25 SENSITIVE Sensitive     CIPROFLOXACIN >=4  RESISTANT Resistant     GENTAMICIN <=1 SENSITIVE Sensitive     IMIPENEM <=0.25 SENSITIVE Sensitive     NITROFURANTOIN <=16 SENSITIVE Sensitive     TRIMETH/SULFA <=20 SENSITIVE Sensitive     AMPICILLIN/SULBACTAM <=2 SENSITIVE Sensitive     PIP/TAZO <=4 SENSITIVE Sensitive     * >=100,000 COLONIES/mL ESCHERICHIA COLI   Staphylococcus aureus - MIC*    CIPROFLOXACIN <=0.5 SENSITIVE Sensitive     GENTAMICIN <=0.5 SENSITIVE Sensitive     NITROFURANTOIN <=16 SENSITIVE Sensitive     OXACILLIN 0.5 SENSITIVE Sensitive     TETRACYCLINE <=1 SENSITIVE Sensitive     VANCOMYCIN 1 SENSITIVE Sensitive     TRIMETH/SULFA <=10 SENSITIVE Sensitive     CLINDAMYCIN RESISTANT Resistant     RIFAMPIN <=0.5 SENSITIVE  Sensitive     Inducible Clindamycin POSITIVE Resistant     * >=100,000 COLONIES/mL STAPHYLOCOCCUS AUREUS         Radiology Studies: MR BRAIN WO CONTRAST  Result Date: 06/11/2022 CLINICAL DATA:  Mental status change, persistent or worsening CT head EXAM: MRI HEAD WITHOUT CONTRAST TECHNIQUE: Multiplanar, multiecho pulse sequences of the brain and surrounding structures were obtained without intravenous contrast. COMPARISON:  CT head 06/10/2022. FINDINGS: Brain: No acute infarction, hemorrhage, hydrocephalus, extra-axial collection or mass lesion. Chronic basal ganglia lacunar infarcts. Chronic microvascular ischemic disease. Vascular: Major arterial flow voids are maintained at the skull base. Skull and upper cervical spine: Normal marrow signal. Sinuses/Orbits: Clear sinuses.  No acute orbital findings. Other: No mastoid effusions. IMPRESSION: No evidence of acute intracranial abnormality. Electronically Signed   By: Margaretha Sheffield M.D.   On: 06/11/2022 11:16   CT Head Wo Contrast  Result Date: 06/10/2022 CLINICAL DATA:  Altered mental status EXAM: CT HEAD WITHOUT CONTRAST TECHNIQUE: Contiguous axial images were obtained from the base of the skull through the vertex without intravenous contrast.  RADIATION DOSE REDUCTION: This exam was performed according to the departmental dose-optimization program which includes automated exposure control, adjustment of the mA and/or kV according to patient size and/or use of iterative reconstruction technique. COMPARISON:  CT head Mar 20, 2022.  MRI head Mar 21, 2022 FINDINGS: Brain: Chronic infarcts in the basal ganglia bilaterally. Mild white matter hypodensity bilaterally. Negative for acute infarct, hemorrhage, mass Vascular: Negative for hyperdense vessel Skull: Negative Sinuses/Orbits: Mucosal edema paranasal sinuses. Bilateral cataract extraction Other: None IMPRESSION: No acute abnormality Chronic infarcts in the basal ganglia bilaterally. Mild chronic microvascular ischemic change in the white matter. Electronically Signed   By: Franchot Gallo M.D.   On: 06/10/2022 17:41   CT Abdomen Pelvis W Contrast  Result Date: 06/10/2022 CLINICAL DATA:  Altered level of consciousness, recent gastrointestinal bleeding EXAM: CT ABDOMEN AND PELVIS WITH CONTRAST TECHNIQUE: Multidetector CT imaging of the abdomen and pelvis was performed using the standard protocol following bolus administration of intravenous contrast. RADIATION DOSE REDUCTION: This exam was performed according to the departmental dose-optimization program which includes automated exposure control, adjustment of the mA and/or kV according to patient size and/or use of iterative reconstruction technique. CONTRAST:  167m OMNIPAQUE IOHEXOL 300 MG/ML  SOLN COMPARISON:  None Available. FINDINGS: Lower chest: No acute pleural or parenchymal lung disease. Hepatobiliary: No focal liver abnormality is seen. No gallstones, gallbladder wall thickening, or biliary dilatation. Pancreas: Unremarkable. No pancreatic ductal dilatation or surrounding inflammatory changes. Spleen: Normal in size without focal abnormality. Adrenals/Urinary Tract: The kidneys enhance normally and symmetrically. The adrenals are unremarkable.  Bladder is decompressed, with no gross abnormality. Stomach/Bowel: No bowel obstruction or ileus. Normal appendix right lower quadrant. Large amount of retained stool within the ascending and transverse colon consistent with constipation. No bowel wall thickening or inflammatory change. Small hiatal hernia. Large diverticulum off the gastric fundus. Vascular/Lymphatic: Aortic atherosclerosis. No enlarged abdominal or pelvic lymph nodes. Reproductive: Status post hysterectomy. No adnexal masses. Other: No free fluid or free intraperitoneal gas. No abdominal wall hernia. Musculoskeletal: No acute or destructive bony lesions. Reconstructed images demonstrate no additional findings. IMPRESSION: 1. No acute intra-abdominal or intrapelvic process. 2. Significant fecal retention within the proximal colon consistent with constipation. No obstruction or ileus. 3. Small hiatal hernia. 4.  Aortic Atherosclerosis (ICD10-I70.0). Electronically Signed   By: MRanda NgoM.D.   On: 06/10/2022 17:41        Scheduled Meds:  amLODipine  10 mg Oral Daily   aspirin  81 mg Oral Daily   atorvastatin  80 mg Oral QHS   buPROPion  150 mg Oral q morning   clopidogrel  75 mg Oral Daily   enoxaparin (LOVENOX) injection  40 mg Subcutaneous Q24H   gabapentin  100 mg Oral QHS   levothyroxine  75 mcg Oral Q0600   pantoprazole  40 mg Oral BID   polyethylene glycol  17 g Oral BID   sodium chloride flush  3 mL Intravenous Q12H   venlafaxine XR  225 mg Oral Q breakfast   Continuous Infusions:  cefTRIAXone (ROCEPHIN)  IV       LOS: 0 days    Time spent: 35 minutes    Barb Merino, MD [facility] Pager [phone]

## 2022-06-11 NOTE — Evaluation (Signed)
Physical Therapy Evaluation Patient Details Name: Jessica [name] MRN: [id number] DOB: Jul 19, 1941 Today's Date: 06/11/2022  History of Present Illness  Pt is a 81 y/o female presenting on 8/9 with increased fatigue and confusion.  CT negative, questionalbe UTI. Recent admission 7/27 at [facility] for acute GIB. Past medical history of triple negative stage IIa ductal carcinoma of the left breast cancer s/p partial mastectomy 2018, HLD, HTN, CVA May 2023 with R residual deficits and expressive aphaisa.  Clinical Impression   Pt presents with generalized weakness, impaired balance, impaired gait, and decreased activity tolerance. Pt to benefit from acute PT to address deficits. Pt ambulated hallway distance with use of RW, pt reports her cognition feels like it is improving and she is getting closer to mobility baseline. PT to progress mobility as tolerated, and will continue to follow acutely.         Recommendations for follow up therapy are one component of a multi-disciplinary discharge planning process, led by the attending physician.  Recommendations may be updated based on patient status, additional functional criteria and insurance authorization.  Follow Up Recommendations Home health PT      Assistance Recommended at Discharge Set up Supervision/Assistance  Patient can return home with the following  A little help with walking and/or transfers;A little help with bathing/dressing/bathroom;Help with stairs or ramp for entrance    Equipment Recommendations None recommended by PT  Recommendations for Other Services       Functional Status Assessment Patient has had a recent decline in their functional status and demonstrates the ability to make significant improvements in function in a reasonable and predictable amount of time.     Precautions / Restrictions Precautions Precautions: Fall Precaution Comments: expressive aphasia, hx CVA with Right residual deficits Restrictions Weight  Bearing Restrictions: No      Mobility  Bed Mobility Overal bed mobility: Needs Assistance Bed Mobility: Supine to Sit     Supine to sit: Min assist, HOB elevated     General bed mobility comments: assist for LE progression to EOB, scooting forward in step-wise fashion with PT assisting at hips    Transfers Overall transfer level: Needs assistance Equipment used: Rolling walker (2 wheels) Transfers: Sit to/from Stand Sit to Stand: Min assist           General transfer comment: light steadying assist    Ambulation/Gait Ambulation/Gait assistance: Min guard Gait Distance (Feet): 160 Feet Assistive device: Rolling walker (2 wheels) Gait Pattern/deviations: Step-through pattern, Decreased stride length Gait velocity: decr     General Gait Details: cues for upright posture, maintaining straight trajectory of RW as pt listing L  Stairs            Wheelchair Mobility    Modified Rankin (Stroke Patients Only)       Balance Overall balance assessment: Needs assistance Sitting-balance support: No upper extremity supported, Feet supported Sitting balance-Leahy Scale: Fair     Standing balance support: Bilateral upper extremity supported, During functional activity Standing balance-Leahy Scale: Poor Standing balance comment: relies on BUE support                             Pertinent Vitals/Pain Pain Assessment Pain Assessment: No/denies pain    Home Living Family/patient expects to be discharged to:: Private residence Living Arrangements: Children Available Help at Discharge: Family;Available 24 hours/day;Personal care attendant Type of Home: House Home Access: Stairs to enter Entrance Stairs-Rails: None Entrance Stairs-Number of Steps:  1 in front, no steps in through garage   Home Layout: One level Home Equipment: Conservation officer, nature (2 wheels);Grab bars - toilet;BSC/3in1;Tub bench;Transport chair Additional Comments: daughter providing home  setup; reports other daughter lives with pt and have aide during the day m-f (8-4 ish)    Prior Function Prior Level of Function : Needs assist             Mobility Comments: using RW for mobility, min assist for transfers, min guard for mobility ADLs Comments: some assist dressing, assist for showers     Hand Dominance   Dominant Hand: Right    Extremity/Trunk Assessment   Upper Extremity Assessment Upper Extremity Assessment: Defer to OT evaluation    Lower Extremity Assessment Lower Extremity Assessment: RLE deficits/detail;LLE deficits/detail RLE Deficits / Details: 3+/5 hip abd/add, 4/5 knee flex/ext LLE Deficits / Details: 3+/5 hip abd/add, 4/5 knee flex/ext    Cervical / Trunk Assessment Cervical / Trunk Assessment: Normal  Communication   Communication: Expressive difficulties  Cognition Arousal/Alertness: Awake/alert Behavior During Therapy: Flat affect Overall Cognitive Status: History of cognitive impairments - at baseline                                          General Comments General comments (skin integrity, edema, etc.): daughter at bedside    Exercises     Assessment/Plan    PT Assessment Patient needs continued PT services  PT Problem List Decreased strength;Decreased mobility;Decreased balance;Decreased activity tolerance;Decreased knowledge of use of DME       PT Treatment Interventions Gait training;DME instruction;Therapeutic exercise;Functional mobility training;Therapeutic activities;Patient/family education;Balance training    PT Goals (Current goals can be found in the Care Plan section)  Acute Rehab PT Goals Patient Stated Goal: home PT Goal Formulation: With patient Time For Goal Achievement: 06/25/22 Potential to Achieve Goals: Good    Frequency Min 3X/week     Co-evaluation               AM-PAC PT "6 Clicks" Mobility  Outcome Measure Help needed turning from your back to your side while in a  flat bed without using bedrails?: A Little Help needed moving from lying on your back to sitting on the side of a flat bed without using bedrails?: A Little Help needed moving to and from a bed to a chair (including a wheelchair)?: A Little Help needed standing up from a chair using your arms (e.g., wheelchair or bedside chair)?: A Little Help needed to walk in hospital room?: A Little Help needed climbing 3-5 steps with a railing? : A Little 6 Click Score: 18    End of Session   Activity Tolerance: Patient tolerated treatment well Patient left: in bed;with call bell/phone within reach;with bed alarm set;with family/visitor present Nurse Communication: Mobility status PT Visit Diagnosis: Unsteadiness on feet (R26.81)    Time: 0786-7544 PT Time Calculation (min) (ACUTE ONLY): 24 min   Charges:   PT Evaluation $PT Eval Low Complexity: 1 Low         Linna Thebeau S, PT DPT Acute Rehabilitation Services Pager [phone]  Office [phone]   Louis Matte 06/11/2022, 4:26 PM

## 2022-06-11 NOTE — Plan of Care (Signed)
Patient arrived to 8V29 from ED. A/ox3, unsure why she is here. Safety precautions and orders reviewed. TELE applied. She denied any pain, distress or expressed any concerns. VSS. Will continue to monitor.    Problem: Education: Goal: Knowledge of General Education information will improve Description: Including pain rating scale, medication(s)/side effects and non-pharmacologic comfort measures Outcome: Progressing

## 2022-06-11 NOTE — ED Notes (Signed)
[name] sats 88% RA while sleeping d/t sleep apnea. Pt placed on 2L , O2 now 98%

## 2022-06-11 NOTE — ED Notes (Signed)
Called 3W for purple man, unable to at this time d/t a code on the floor.

## 2022-06-11 NOTE — TOC Initial Note (Signed)
Transition of Care [facility]) - Initial/Assessment Note    Patient Details  Name: Jessica [name] MRN: [id number] Date of Birth: [birth year]  Transition of [facility] CM/SW Contact:    Pollie Friar, RN Phone Number: 06/11/2022, 2:16 PM  Clinical Narrative:                 Patient is from home with her daughter, [name]. Her other daughter, Bedelia Person also assists.  Pt is active with [facility] for [facility] services and they will re see her at d/c.  Daughter states she has all needed DME at home. They manage her medications and provide needed transportation.  Daughter interested in [place] for home. CM has supplied her with the brochure.  TOC following.  Expected Discharge Plan: [place] Barriers to Discharge: Continued Medical Work up   Patient Goals and CMS Choice   CMS Medicare.gov Compare Post Acute Care list provided to:: Patient Represenative (must comment) Choice offered to / list presented to : Adult Children  Expected Discharge Plan and Services Expected Discharge Plan: [place]   Discharge Planning Services: CM Consult Post Acute Care Choice: [place] arrangements for the past 2 months: [place]: PT, OT HH Agency: [facility] Date [place]: 06/11/22   Representative spoke with at [place]: Tommi Rumps  Prior Living Arrangements/Services Living arrangements for the past 2 months: [place] Lives with:: Adult Children Patient language and need for interpreter reviewed:: Yes Do you feel safe going back to the place where you live?: Yes      Need for Family Participation in Patient Care: Yes (Comment) Care giver support system in place?: Yes (comment) Current home services: Home OT, Home PT Criminal Activity/Legal Involvement Pertinent to Current Situation/Hospitalization: No - Comment as needed  Activities of Daily Living Home Assistive  Devices/Equipment: Environmental consultant (specify type) ADL Screening (condition at time of admission) Patient's cognitive ability adequate to safely complete daily activities?: No Is the patient deaf or have difficulty hearing?: Yes Does the patient have difficulty seeing, even when wearing glasses/contacts?: No Does the patient have difficulty concentrating, remembering, or making decisions?: Yes Patient able to express need for assistance with ADLs?: No Does the patient have difficulty dressing or bathing?: Yes Independently performs ADLs?: No Does the patient have difficulty walking or climbing stairs?: Yes Weakness of Legs: Both Weakness of Arms/Hands: None  Permission Sought/Granted                  Emotional Assessment Appearance:: Appears stated age Attitude/Demeanor/Rapport:  (Quiet) Affect (typically observed): Accepting, Calm Orientation: : Oriented to Self, Oriented to Place, Oriented to Situation   Psych Involvement: No (comment)  Admission diagnosis:  Lower urinary tract infectious disease [N39.0] Acute encephalopathy [G93.40] Constipation, unspecified constipation type [K59.00] Altered mental status, unspecified altered mental status type [R41.82] Altered mental status [R41.82] Patient Active Problem List   Diagnosis Date Noted   Altered mental status 06/11/2022   Acute encephalopathy 06/10/2022   UTI (urinary tract infection) 06/10/2022   Constipation    History of stroke in adulthood 06/05/2022   Heme positive stool    Iron deficiency anemia due to chronic blood loss    Left thalamic infarction ([place]) 03/23/2022   Acquired hammer toe of right foot 08/19/2021  Adverse reaction to ACE-I (angiotensin-converting enzyme inhibitor) 08/19/2021   Allergic rhinitis 08/19/2021   Anxiety 08/19/2021   Asthma 08/19/2021   Cardiomegaly 08/19/2021   Dependence on other enabling machines and devices 08/19/2021   History of adenomatous polyp of colon 08/19/2021   History of  pneumonia 08/19/2021   Internal hemorrhoid 08/19/2021   Iron deficiency anemia secondary to inadequate dietary iron intake 08/19/2021   Osteopenia 08/19/2021   Personal history of malignant neoplasm of breast 08/19/2021   Somnolence 08/19/2021   Vitamin D deficiency 08/19/2021   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   Insomnia 06/25/2019   Obstructive sleep apnea 06/13/2019   Genetic testing 06/29/2017   Family history of melanoma    Family history of prostate cancer    Family history of breast cancer    Angioedema 05/26/2017   Left axillary seroma status post incision and drainage 05/18/2017 05/18/2017   Cellulitis of left axilla 05/17/2017   GERD (gastroesophageal reflux disease) 05/16/2017   Acquired hypothyroidism 05/16/2017   Depression with anxiety 05/16/2017   Breast cancer of upper-inner quadrant of left female breast ([place]) 03/11/2017   Goals of care, counseling/discussion 03/11/2017   Obesity (BMI 30-39.9) 08/31/2013   Essential hypertension - well controlled 08/31/2013    Class: Diagnosis of   Depression 08/31/2013    Class: Diagnosis of   Vasovagal near-syncope -rare 08/31/2013    Class: History of   Dyslipidemia, goal LDL below 160 08/31/2013   PCP:  [name], MD Pharmacy:   [id number] [id number]: [phone]: [phone]    Social Determinants of Health (SDOH) Interventions    Readmission Risk Interventions    06/05/2022   12:47 PM  Readmission Risk Prevention Plan  Transportation Screening Complete  PCP or Specialist Appt within 3-5 Days Complete  HRI or Home Care Consult Complete  Social Work Consult for ROldsPlanning/Counseling Complete  Palliative Care Screening Not Applicable  Medication Review (Press photographer Complete

## 2022-06-11 NOTE — Evaluation (Signed)
Occupational Therapy Evaluation Patient Details Name: Jessica [name] MRN: [id number] DOB: 21-Apr-1941 Today's Date: 06/11/2022   History of Present Illness Pt is a 81 y/o female presenting on 8/9 with increased fatigue and confusion.  CT negative, questionalbe UTI. Past medical history of triple negative stage IIa ductal carcinoma of the left breast cancer s/p partial mastectomy 2018, HLD, HTN, CVA May 2023 with R residual deficits and expressive aphaisa.   Clinical Impression   PTA patient needing some assist for transfers using RW, min guard to supervision for mobility using RW, and some assist for ADLs. Has support of daughter and aide at home to provide 24/7.  She was admitted for above and presents with problem list below, including impaired balance, decreased activity tolerance, generalized weakness.  Noted hx of expressive aphasia and R residual deficits from CVA, daughter reporting these symptoms are "worse" than typical.  Patient currently requires min assist for bed mobility and transfers, limited mobility in room using RW, min to mod assist for ADLs.  Based on performance today, believe pt will best benefit from continued OT services acutely and after dc at [facility] level to optimize independence, safety and return to PLOF. Will follow acutely.      Recommendations for follow up therapy are one component of a multi-disciplinary discharge planning process, led by the attending physician.  Recommendations may be updated based on patient status, additional functional criteria and insurance authorization.   Follow Up Recommendations  Home health OT    Assistance Recommended at Discharge Frequent or constant Supervision/Assistance  Patient can return home with the following A little help with walking and/or transfers;A lot of help with bathing/dressing/bathroom;Assistance with cooking/housework;Direct supervision/assist for medications management;Direct supervision/assist for financial  management;Assist for transportation;Help with stairs or ramp for entrance    Functional Status Assessment  Patient has had a recent decline in their functional status and demonstrates the ability to make significant improvements in function in a reasonable and predictable amount of time.  Equipment Recommendations  None recommended by OT    Recommendations for Other Services       Precautions / Restrictions Precautions Precautions: Fall Precaution Comments: expressive aphasia, hx CVA with Right residual deficits Restrictions Weight Bearing Restrictions: No      Mobility Bed Mobility Overal bed mobility: Needs Assistance Bed Mobility: Supine to Sit, Sit to Supine     Supine to sit: Min assist, HOB elevated Sit to supine: Min assist, HOB elevated   General bed mobility comments: trunk support to ascend given increased time, min assist to fully bring LEs back to supine    Transfers Overall transfer level: Needs assistance Equipment used: Rolling walker (2 wheels) Transfers: Sit to/from Stand Sit to Stand: Min assist           General transfer comment: to power up and steady, cueing for hand placement      Balance Overall balance assessment: Needs assistance Sitting-balance support: No upper extremity supported, Feet supported Sitting balance-Leahy Scale: Fair     Standing balance support: Bilateral upper extremity supported, During functional activity Standing balance-Leahy Scale: Poor Standing balance comment: relies on BUE support                           ADL either performed or assessed with clinical judgement   ADL Overall ADL's : Needs assistance/impaired     Grooming: Wash/dry hands;Minimal assistance;Standing           Upper Body Dressing :  Moderate assistance;Sitting   Lower Body Dressing: Moderate assistance;Sit to/from stand Lower Body Dressing Details (indicate cue type and reason): able to don socks, min assist in standing and  relies on BUE support Toilet Transfer: Minimal assistance;Stand-pivot;BSC/3in1;Rolling walker (2 wheels)   Toileting- Clothing Manipulation and Hygiene: Moderate assistance;Sitting/lateral lean;Sit to/from stand       Functional mobility during ADLs: Minimal assistance;Rolling walker (2 wheels);Cueing for safety General ADL Comments: pt limited by fatigue     Vision   Vision Assessment?: No apparent visual deficits     Perception     Praxis      Pertinent Vitals/Pain Pain Assessment Pain Assessment: No/denies pain     Hand Dominance Right   Extremity/Trunk Assessment Upper Extremity Assessment Upper Extremity Assessment: RUE deficits/detail;LUE deficits/detail RUE Deficits / Details: residual right sided deficits s/p CVA in May; able to use functionally with demonstrating at least 3+/5 MMT LUE Deficits / Details: pain in LUE with all movement   Lower Extremity Assessment Lower Extremity Assessment: Defer to PT evaluation       Communication Communication Communication: Expressive difficulties   Cognition Arousal/Alertness: Awake/alert Behavior During Therapy: Flat affect Overall Cognitive Status: History of cognitive impairments - at baseline                                 General Comments: pt with expressive aphasia at baseline, daughter reports "worse" than normal.  Pt able to follow simple commands with increased time.     General Comments  daughter present and supportive    Exercises     Shoulder Instructions      Home Living Family/patient expects to be discharged to:: Private residence Living Arrangements: Children Available Help at Discharge: Family;Available 24 hours/day;Personal care attendant Type of Home: House Home Access: Stairs to enter [employer] of Steps: 1 in front, no steps in through garage Entrance Stairs-Rails: None Home Layout: One level     Bathroom Shower/Tub: Teacher, early years/pre:  Handicapped height     Home Equipment: Conservation officer, nature (2 wheels);Grab bars - toilet;BSC/3in1;Tub bench;Transport chair   Additional Comments: daughter providing home setup; reports other daughter lives with pt and have aide during the day m-f (8-4 ish)      Prior Functioning/Environment Prior Level of Function : Needs assist             Mobility Comments: using RW for mobility, min assist for transfers, min guard for mobility ADLs Comments: some assist dressing, assist for showers        OT Problem List: Decreased strength;Decreased activity tolerance;Impaired balance (sitting and/or standing);Decreased safety awareness;Cardiopulmonary status limiting activity;Decreased knowledge of precautions;Decreased knowledge of use of DME or AE;Impaired UE functional use;Pain;Decreased cognition      OT Treatment/Interventions: Self-care/ADL training;Therapeutic exercise;DME and/or AE instruction;Therapeutic activities;Balance training;Patient/family education;Cognitive remediation/compensation;Neuromuscular education    OT Goals(Current goals can be found in the care plan section) Acute Rehab OT Goals Patient Stated Goal: get better OT Goal Formulation: With patient/family Time For Goal Achievement: 06/25/22 Potential to Achieve Goals: Fair  OT Frequency: Min 2X/week    Co-evaluation              AM-PAC OT "6 Clicks" Daily Activity     Outcome Measure Help from another person eating meals?: A Little Help from another person taking care of personal grooming?: A Little Help from another person toileting, which includes using toliet, bedpan, or urinal?: A Lot  Help from another person bathing (including washing, rinsing, drying)?: A Lot Help from another person to put on and taking off regular upper body clothing?: A Little Help from another person to put on and taking off regular lower body clothing?: A Lot 6 Click Score: 15   End of Session Equipment Utilized During Treatment:  Rolling walker (2 wheels) Nurse Communication: Mobility status  Activity Tolerance: Patient tolerated treatment well Patient left: in bed;with call bell/phone within reach;with bed alarm set;with family/visitor present  OT Visit Diagnosis: Other abnormalities of gait and mobility (R26.89);Muscle weakness (generalized) (M62.81);Hemiplegia and hemiparesis;Cognitive communication deficit (R41.841) Symptoms and signs involving cognitive functions: Cerebral infarction (chronic) Hemiplegia - Right/Left: Right Hemiplegia - dominant/non-dominant: Dominant Hemiplegia - caused by: Cerebral infarction                Time: 0955-1030 OT Time Calculation (min): 35 min Charges:  OT General Charges $OT Visit: 1 Visit OT Evaluation $OT Eval Moderate Complexity: 1 Mod OT Treatments $Self Care/Home Management : 8-22 mins  Jolaine Artist, OT Acute Rehabilitation Services Office [phone]   Delight Stare 06/11/2022, 10:41 AM

## 2022-06-12 ENCOUNTER — Telehealth: Payer: Self-pay

## 2022-06-12 DIAGNOSIS — G9340 Encephalopathy, unspecified: Secondary | ICD-10-CM | POA: Diagnosis not present

## 2022-06-12 LAB — URINE CULTURE

## 2022-06-12 MED ORDER — SODIUM CHLORIDE 0.9 % IV SOLN
1.0000 g | Freq: Once | INTRAVENOUS | Status: AC
  Administered 2022-06-12: 1 g via INTRAVENOUS
  Filled 2022-06-12: qty 10

## 2022-06-12 MED ORDER — LACTULOSE 10 GM/15ML PO SOLN
30.0000 g | Freq: Once | ORAL | Status: AC
  Administered 2022-06-12: 30 g via ORAL
  Filled 2022-06-12: qty 60

## 2022-06-12 MED ORDER — LACTULOSE 10 GM/15ML PO SOLN
30.0000 g | Freq: Three times a day (TID) | ORAL | Status: DC
  Administered 2022-06-12 (×2): 30 g via ORAL
  Filled 2022-06-12 (×2): qty 60

## 2022-06-12 MED ORDER — FLEET ENEMA 7-19 GM/118ML RE ENEM
1.0000 | ENEMA | Freq: Once | RECTAL | Status: AC
  Administered 2022-06-12: 1 via RECTAL
  Filled 2022-06-12: qty 1

## 2022-06-12 MED ORDER — LACTULOSE 10 GM/15ML PO SOLN: 30.0000 g | Freq: Two times a day (BID) | ORAL | 2 refills | Status: DC | PRN

## 2022-06-12 NOTE — Telephone Encounter (Signed)
   Telephone encounter was:  Successful.  06/12/2022 Name: Jessica [name] MRN: [id number] DOB: 1941/07/27  Jessica [name] is a 81 y.o. year old female who is a primary care patient of [name], MD . The community resource team was consulted for assistance with  medical supplies  Care guide performed the following interventions: Patient provided with information about care guide support team and interviewed to confirm resource needs.Pt is in the hospital and nothing is needed at this time will do research on purewick cost for patient  Follow Up Plan:  No further follow up planned at this time. The patient has been provided with needed resources.    [place], Care Management  [phone] [address], [place], [place] [address] Phone: [phone] Email: [email or web address]

## 2022-06-12 NOTE — Progress Notes (Signed)
Occupational Therapy Treatment Patient Details Name: Jessica [name] MRN: [id number] DOB: [birth year] Today's Date: 06/12/2022   History of present illness Pt is a 81 y/o female presenting on 8/9 with increased fatigue and confusion.  CT negative, questionalbe UTI. Recent admission 7/27 at [facility] for acute GIB. Past medical history of triple negative stage IIa ductal carcinoma of the left breast cancer s/p partial mastectomy 2018, HLD, HTN, CVA May 2023 with R residual deficits and expressive aphaisa.   OT comments  Pt making incremental progress with OT goals. At this time pt demonstrating increased strength and balance. She is requiring min guard for most functional mobility and ADL's. Requiring reminders to correct her balance upon standing and for hand placement with RW. Continuing to recommend HHOT to maximize independence and safety. OT will follow acutely.    Recommendations for follow up therapy are one component of a multi-disciplinary discharge planning process, led by the attending physician.  Recommendations may be updated based on patient status, additional functional criteria and insurance authorization.    Follow Up Recommendations  Home health OT    Assistance Recommended at Discharge Frequent or constant Supervision/Assistance  Patient can return home with the following  A little help with walking and/or transfers;A lot of help with bathing/dressing/bathroom;Assistance with cooking/housework;Direct supervision/assist for medications management;Direct supervision/assist for financial management;Assist for transportation;Help with stairs or ramp for entrance   Equipment Recommendations  None recommended by OT    Recommendations for Other Services      Precautions / Restrictions Precautions Precautions: Fall Restrictions Weight Bearing Restrictions: No       Mobility Bed Mobility Overal bed mobility: Needs Assistance Bed Mobility: Supine to Sit     Supine to sit:  Min guard, HOB elevated     General bed mobility comments: Increased time and min guard for safety due to initial waver in balance    Transfers Overall transfer level: Needs assistance Equipment used: Rolling walker (2 wheels) Transfers: Sit to/from Stand Sit to Stand: Min guard           General transfer comment: light steadying assist     Balance Overall balance assessment: Needs assistance Sitting-balance support: No upper extremity supported, Feet supported Sitting balance-Leahy Scale: Fair     Standing balance support: Bilateral upper extremity supported, During functional activity Standing balance-Leahy Scale: Poor Standing balance comment: relies on BUE support                           ADL either performed or assessed with clinical judgement   ADL Overall ADL's : Needs assistance/impaired Eating/Feeding: Set up;Sitting Eating/Feeding Details (indicate cue type and reason): drinking and taking medication sitting EOB Grooming: Wash/dry hands;Oral care;Min guard;Standing Grooming Details (indicate cue type and reason): at sink                 Toilet Transfer: Min guard;Ambulation Toilet Transfer Details (indicate cue type and reason): to bathroom Toileting- Clothing Manipulation and Hygiene: Minimal assistance;Sitting/lateral lean;Sit to/from stand Toileting - Clothing Manipulation Details (indicate cue type and reason): Min assist in standing for balance and pericare     Functional mobility during ADLs: Min guard;Rolling walker (2 wheels) General ADL Comments: Overall min guard for safety due to baalnce    Extremity/Trunk Assessment              Vision       Perception     Praxis      Cognition Arousal/Alertness: Awake/alert  Behavior During Therapy: Flat affect Overall Cognitive Status: History of cognitive impairments - at baseline                                 General Comments: pt with expressive aphasia at  baseline, daughter reports "worse" than normal.  Pt able to follow simple commands with increased time.        Exercises      Shoulder Instructions       General Comments VSS on RA, daughter in room and supportive    Pertinent Vitals/ Pain       Pain Assessment Pain Assessment: No/denies pain  Home Living                                          Prior Functioning/Environment              Frequency  Min 2X/week        Progress Toward Goals  OT Goals(current goals can now be found in the care plan section)  Progress towards OT goals: Progressing toward goals  Acute Rehab OT Goals Patient Stated Goal: To go home OT Goal Formulation: With patient/family Time For Goal Achievement: 06/25/22 Potential to Achieve Goals: Fair ADL Goals Pt Will Perform Grooming: standing;with supervision Pt Will Perform Lower Body Dressing: with supervision;sit to/from stand Pt Will Transfer to Toilet: with supervision;ambulating;bedside commode;regular height toilet Pt Will Perform Toileting - Clothing Manipulation and hygiene: with supervision;sit to/from stand Pt Will Perform Tub/Shower Transfer: Tub transfer;with supervision;tub bench;ambulating;rolling walker  Plan Discharge plan remains appropriate;Frequency remains appropriate    Co-evaluation                 AM-PAC OT "6 Clicks" Daily Activity     Outcome Measure   Help from another person eating meals?: A Little Help from another person taking care of personal grooming?: A Little Help from another person toileting, which includes using toliet, bedpan, or urinal?: A Lot Help from another person bathing (including washing, rinsing, drying)?: A Lot Help from another person to put on and taking off regular upper body clothing?: A Little Help from another person to put on and taking off regular lower body clothing?: A Lot 6 Click Score: 15    End of Session Equipment Utilized During Treatment:  Rolling walker (2 wheels)  OT Visit Diagnosis: Other abnormalities of gait and mobility (R26.89);Muscle weakness (generalized) (M62.81);Hemiplegia and hemiparesis;Cognitive communication deficit (R41.841) Symptoms and signs involving cognitive functions: Cerebral infarction Hemiplegia - Right/Left: Right Hemiplegia - dominant/non-dominant: Dominant Hemiplegia - caused by: Cerebral infarction   Activity Tolerance Patient tolerated treatment well   Patient Left in bed;with call bell/phone within reach;with family/visitor present;with nursing/sitter in room   Nurse Communication Mobility status        Time: 1610-9604 OT Time Calculation (min): 41 min  Charges: OT General Charges $OT Visit: 1 Visit OT Treatments $Self Care/Home Management : 23-37 mins $Therapeutic Activity: 8-22 mins  Zakhi Dupre H., OTR/L Acute Rehabilitation  Madelline Eshbach Elane Kerstie Agent 06/12/2022, 5:45 PM

## 2022-06-12 NOTE — Progress Notes (Signed)
PROGRESS NOTE    Jessica [name]  BOF:[id number] DOB: 27-May-1941 DOA: 06/10/2022 PCP: [name], MD    Brief Narrative:  81 year old with history of stroke in 5/23, depression, anxiety, recent admission with blood loss anemia status post upper GI endoscopy and colonoscopy, E. coli UTI and sent home.  Initially did well at home.  Then she started being more confused and tired lethargic so brought back to the hospital.  In the emergency room hemodynamically stable.  On room air.  EKG with chronic right bundle branch block.  Hemoglobin is stable.  Electrolytes normal.  Lactic acid normal.  Urine with mild abnormalities.  CT head without any acute findings.  Admitted with altered mental status.   Assessment & Plan:   Acute metabolic encephalopathy, cause unknown.  Likely persistent delirium, lethargic and deconditioning from recent hospitalization. -Infection workup negative.  UA was abnormal, urine culture with multiple organisms.  Rocephin day 3 today.  We will discontinue further antibiotics.  -Mental status already improving.  CT head was normal.  With recent history of stroke, MRI of the brain was done and that was without any new findings. -Fall precautions.  Work with PT OT.  Bowel regimen.  Constipation: Significant issue.  Large volume stool in the CT scan of abdomen pelvis.  Not much stool output with lactulose and Fleet enema.  Increase dose of lactulose today.  Mobilize to bathroom with assist.   History of stroke: As above.  No evidence of new stroke.  Continue dual antiplatelet therapy.  Depression/anxiety: On Wellbutrin and Effexor.  Continued.  Aggressive bowel regimen to improve constipation and subsequent discharge with home health therapies.   DVT prophylaxis: enoxaparin (LOVENOX) injection 40 mg Start: 06/10/22 1945   Code Status: DNR Family Communication: Daughter at bedside Disposition Plan: Status is: Inpatient Remains inpatient appropriate because:  Confused, investigations and inpatient monitoring needed.     Consultants:  None  Procedures:  None  Antimicrobials:  Rocephin 8/9--   Subjective:  Patient seen and examined.  Now remains more alert and awake.  Has diffuse abdominal pain.  Denies any nausea vomiting.  Did not have any bowel movement with lactulose.  Fleet enema with very small bowel movement.  Still feels bloated.  Objective: Vitals:   06/11/22 2046 06/11/22 2300 06/12/22 0452 06/12/22 0754  BP: 134/62 (!) 140/68 (!) 145/64 (!) 157/87  Pulse: 70 78 72 78  Resp: '19 19 17 18  '$ Temp: 99.2 F (37.3 C) 98 F (36.7 C) 98.7 F (37.1 C) 98.3 F (36.8 C)  TempSrc: Oral Oral Oral Oral  SpO2: 92% 96% 94% 95%  Weight:      Height:        Intake/Output Summary (Last 24 hours) at 06/12/2022 1509 Last data filed at 06/12/2022 0600 Gross per 24 hour  Intake 440 ml  Output --  Net 440 ml   Filed Weights   06/11/22 0255  Weight: 77.7 kg    Examination:  General exam: Appears calm and comfortable.  Alert awake.  Anxious and flat affect. Respiratory system: Clear to auscultation. Respiratory effort normal. Cardiovascular system: S1 & S2 heard, RRR.  Trace bilateral pedal edema.   Gastrointestinal system: Mildly distended but nontender.  Bowel sound present. Central nervous system: Alert and oriented. No focal neurological deficits. Generalized weakness.  Anxious.     Data Reviewed: I have personally reviewed following labs and imaging studies  CBC: Recent Labs  Lab 06/10/22 1305 06/11/22 0155  WBC 5.3 7.0  NEUTROABS 3.3  --  HGB 10.7* 10.3*  HCT 34.8* 33.3*  MCV 95.3 96.8  PLT 308 245   Basic Metabolic Panel: Recent Labs  Lab 06/10/22 1305 06/11/22 0155  NA 140 138  K 3.7 3.5  CL 105 103  CO2 27 24  GLUCOSE 116* 132*  BUN 10 8  CREATININE 0.64 0.71  CALCIUM 9.4 9.4   GFR: Estimated Creatinine Clearance: 55.3 mL/min (by C-G formula based on SCr of 0.71 mg/dL). Liver Function  Tests: Recent Labs  Lab 06/10/22 1305  AST 27  ALT 36  ALKPHOS 75  BILITOT 0.5  PROT 6.9  ALBUMIN 3.6   Recent Labs  Lab 06/10/22 1556  LIPASE 46   Recent Labs  Lab 06/10/22 2030  AMMONIA 19   Coagulation Profile: No results for input(s): "INR", "PROTIME" in the last 168 hours. Cardiac Enzymes: No results for input(s): "CKTOTAL", "CKMB", "CKMBINDEX", "TROPONINI" in the last 168 hours. BNP (last 3 results) No results for input(s): "PROBNP" in the last 8760 hours. HbA1C: No results for input(s): "HGBA1C" in the last 72 hours. CBG: No results for input(s): "GLUCAP" in the last 168 hours. Lipid Profile: No results for input(s): "CHOL", "HDL", "LDLCALC", "TRIG", "CHOLHDL", "LDLDIRECT" in the last 72 hours. Thyroid Function Tests: Recent Labs    06/10/22 2030  TSH 0.392   Anemia Panel: Recent Labs    06/10/22 2030  VITAMINB12 572   Sepsis Labs: Recent Labs  Lab 06/10/22 1305  LATICACIDVEN 1.1    Recent Results (from the past 240 hour(s))  Urine Culture     Status: Abnormal   Collection Time: 06/10/22 12:12 PM   Specimen: Urine, Clean Catch  Result Value Ref Range Status   Specimen Description URINE, CLEAN CATCH  Final   Special Requests   Final    NONE Performed at [facility], [place] [address], [place], [place] [address]    Culture MULTIPLE SPECIES PRESENT, SUGGEST RECOLLECTION (A)  Final   Report Status 06/12/2022 FINAL  Final         Radiology Studies: MR BRAIN WO CONTRAST  Result Date: 06/11/2022 CLINICAL DATA:  Mental status change, persistent or worsening CT head EXAM: MRI HEAD WITHOUT CONTRAST TECHNIQUE: Multiplanar, multiecho pulse sequences of the brain and surrounding structures were obtained without intravenous contrast. COMPARISON:  CT head 06/10/2022. FINDINGS: Brain: No acute infarction, hemorrhage, hydrocephalus, extra-axial collection or mass lesion. Chronic basal ganglia lacunar infarcts. Chronic microvascular ischemic disease.  Vascular: Major arterial flow voids are maintained at the skull base. Skull and upper cervical spine: Normal marrow signal. Sinuses/Orbits: Clear sinuses.  No acute orbital findings. Other: No mastoid effusions. IMPRESSION: No evidence of acute intracranial abnormality. Electronically Signed   By: Margaretha Sheffield M.D.   On: 06/11/2022 11:16   CT Head Wo Contrast  Result Date: 06/10/2022 CLINICAL DATA:  Altered mental status EXAM: CT HEAD WITHOUT CONTRAST TECHNIQUE: Contiguous axial images were obtained from the base of the skull through the vertex without intravenous contrast. RADIATION DOSE REDUCTION: This exam was performed according to the departmental dose-optimization program which includes automated exposure control, adjustment of the mA and/or kV according to patient size and/or use of iterative reconstruction technique. COMPARISON:  CT head Mar 20, 2022.  MRI head Mar 21, 2022 FINDINGS: Brain: Chronic infarcts in the basal ganglia bilaterally. Mild white matter hypodensity bilaterally. Negative for acute infarct, hemorrhage, mass Vascular: Negative for hyperdense vessel Skull: Negative Sinuses/Orbits: Mucosal edema paranasal sinuses. Bilateral cataract extraction Other: None IMPRESSION: No acute abnormality Chronic infarcts in  the basal ganglia bilaterally. Mild chronic microvascular ischemic change in the white matter. Electronically Signed   By: Franchot Gallo M.D.   On: 06/10/2022 17:41   CT Abdomen Pelvis W Contrast  Result Date: 06/10/2022 CLINICAL DATA:  Altered level of consciousness, recent gastrointestinal bleeding EXAM: CT ABDOMEN AND PELVIS WITH CONTRAST TECHNIQUE: Multidetector CT imaging of the abdomen and pelvis was performed using the standard protocol following bolus administration of intravenous contrast. RADIATION DOSE REDUCTION: This exam was performed according to the departmental dose-optimization program which includes automated exposure control, adjustment of the mA and/or kV  according to patient size and/or use of iterative reconstruction technique. CONTRAST:  126m OMNIPAQUE IOHEXOL 300 MG/ML  SOLN COMPARISON:  None Available. FINDINGS: Lower chest: No acute pleural or parenchymal lung disease. Hepatobiliary: No focal liver abnormality is seen. No gallstones, gallbladder wall thickening, or biliary dilatation. Pancreas: Unremarkable. No pancreatic ductal dilatation or surrounding inflammatory changes. Spleen: Normal in size without focal abnormality. Adrenals/Urinary Tract: The kidneys enhance normally and symmetrically. The adrenals are unremarkable. Bladder is decompressed, with no gross abnormality. Stomach/Bowel: No bowel obstruction or ileus. Normal appendix right lower quadrant. Large amount of retained stool within the ascending and transverse colon consistent with constipation. No bowel wall thickening or inflammatory change. Small hiatal hernia. Large diverticulum off the gastric fundus. Vascular/Lymphatic: Aortic atherosclerosis. No enlarged abdominal or pelvic lymph nodes. Reproductive: Status post hysterectomy. No adnexal masses. Other: No free fluid or free intraperitoneal gas. No abdominal wall hernia. Musculoskeletal: No acute or destructive bony lesions. Reconstructed images demonstrate no additional findings. IMPRESSION: 1. No acute intra-abdominal or intrapelvic process. 2. Significant fecal retention within the proximal colon consistent with constipation. No obstruction or ileus. 3. Small hiatal hernia. 4.  Aortic Atherosclerosis (ICD10-I70.0). Electronically Signed   By: MRanda NgoM.D.   On: 06/10/2022 17:41        Scheduled Meds:  amLODipine  10 mg Oral Daily   aspirin  81 mg Oral Daily   atorvastatin  80 mg Oral QHS   buPROPion  150 mg Oral q morning   clopidogrel  75 mg Oral Daily   enoxaparin (LOVENOX) injection  40 mg Subcutaneous Q24H   gabapentin  100 mg Oral QHS   lactulose  30 g Oral TID   levothyroxine  75 mcg Oral Q0600   pantoprazole   40 mg Oral BID   sodium chloride flush  3 mL Intravenous Q12H   venlafaxine XR  225 mg Oral Q breakfast   Continuous Infusions:     LOS: 1 day    Time spent: 35 minutes    KBarb Merino MD [facility] Pager [phone]

## 2022-06-13 ENCOUNTER — Inpatient Hospital Stay (HOSPITAL_COMMUNITY): Payer: Medicare Other

## 2022-06-13 DIAGNOSIS — G9340 Encephalopathy, unspecified: Secondary | ICD-10-CM | POA: Diagnosis not present

## 2022-06-13 MED ORDER — DOCUSATE SODIUM 100 MG PO CAPS: 100.0000 mg | ORAL_CAPSULE | Freq: Two times a day (BID) | ORAL | 2 refills | Status: AC

## 2022-06-13 NOTE — TOC Transition Note (Signed)
Transition of Care [facility]) - CM/SW Discharge Note   Patient Details  Name: Jessica [name] MRN: [id number] Date of Birth: 10-28-1941  [facility] CM/SW Contact:  Carles Collet, RN Phone Number: 06/13/2022, 8:27 AM   Clinical Narrative:    [facility] of DC today. No other TOC needs for DC.        Barriers to Discharge: No Barriers Identified   Patient Goals and CMS Choice   CMS Medicare.gov Compare Post Acute Care list provided to:: Patient Represenative (must comment) Choice offered to / list presented to : Adult Children  Discharge Placement                       Discharge Plan and Services   Discharge Planning Services: CM Consult Post Acute Care Choice: Home Health                    HH Arranged: PT, OT [facility] Agency: [place] Date [facility] Agency Contacted: 06/13/22 Time [place] Agency Contacted: [phone] Representative spoke with at [place]: [place] ([place]) Interventions     Readmission Risk Interventions    06/05/2022   12:47 PM  Readmission Risk Prevention Plan  Transportation Screening Complete  PCP or Specialist Appt within 3-5 Days Complete  HRI or [place] Complete  Social Work Consult for [place] Planning/Counseling Complete  Palliative Care Screening Not Applicable  Medication Review [employer] photographer) Complete

## 2022-06-13 NOTE — Plan of Care (Signed)
AVS reviewed with patient and daughter. All questions answered at this time. Awaiting for transportation to disposition.  Problem: Education: Goal: Knowledge of General Education information will improve Description: Including pain rating scale, medication(s)/side effects and non-pharmacologic comfort measures Outcome: Adequate for Discharge   Problem: Health Behavior/Discharge Planning: Goal: Ability to manage health-related needs will improve Outcome: Adequate for Discharge

## 2022-06-13 NOTE — Discharge Summary (Signed)
Physician Discharge Summary  Jessica [name] DDU:[id number] DOB: December 03, 1940 DOA: 06/10/2022  PCP: [name], MD  Admit date: 06/10/2022 Discharge date: 06/13/2022  Admitted From: Home Disposition: Home with home health PT  Recommendations for Outpatient Follow-up:  Follow up with PCP in 1-2 weeks   Home Health: PT/OT Equipment/Devices: None  Discharge Condition: Stable CODE STATUS: DNR Diet recommendation: Low-salt diet, avoid constipation  Discharge summary: 81 year old with history of stroke in may of 2023, depression, anxiety, recent admission with blood loss anemia status post upper GI endoscopy and colonoscopy, E. coli UTI and sent home.  Initially did well at home.  Then she started being more confused and tired lethargic so brought back to the hospital.  In the emergency room hemodynamically stable.  On room air.  EKG with chronic right bundle branch block.  Hemoglobin is stable.  Electrolytes normal.  Lactic acid normal.  Urine with mild abnormalities.  CT head without any acute findings.  Admitted with altered mental status severe constipation.  She did not have any bowel movement after colonoscopy 10 days ago.     Acute metabolic encephalopathy, cause unknown.  Likely persistent delirium, lethargic and deconditioning from recent hospitalization.  No organic cause was found.  Patient did good clinical recovery and normal mentation now. -Infection workup negative.  UA was abnormal, urine culture with multiple organisms.  Received Rocephin IV for 3 days.  No indication to continue further antibiotics.  No urinary retention.   -Mental status already improving.  CT head was normal.  With recent history of stroke, MRI of the brain was done and that was without any new findings. -Fall precautions.  Home health PT OT.  Bowel regimen.   Constipation: Significant issue.  Large volume stool in the CT scan of abdomen pelvis.  Multiple interventions with little success, lactulose was  useful. Patient had large bowel movement today with improvement of abdominal bloating. She has chronic constipation. She will use Colace 100 mg twice daily scheduled doses, lactulose 30 g twice a day as needed to ensure a smooth bowel movements.   History of stroke: As above.  No evidence of new stroke.  Continue dual antiplatelet therapy.  Hemoglobin is stable.   Depression/anxiety: On Wellbutrin and Effexor.  Continued.   Stable to discharge home with family.     Discharge Diagnoses:  Principal Problem:   Acute encephalopathy Active Problems:   Essential hypertension - well controlled   Acquired hypothyroidism   Depression with anxiety   History of stroke in adulthood   UTI (urinary tract infection)   Altered mental status    Discharge Instructions  Discharge Instructions     Diet - low sodium heart healthy   Complete by: As directed    Increase activity slowly   Complete by: As directed    Increase activity slowly   Complete by: As directed       Allergies as of 06/13/2022       Reactions   Advil [ibuprofen] Hives, Itching   Codeine Hives   Penicillins Hives   Has patient had a PCN reaction causing immediate rash, facial/tongue/throat swelling, SOB or lightheadedness with hypotension: yes Has patient had a PCN reaction causing severe rash involving mucus membranes or skin necrosis:  no Has patient had a PCN reaction that required hospitalization:  no Has patient had a PCN reaction occurring within the last 10 years: no If all of the above answers are "NO", then may proceed with Cephalosporin use. Has patient had a  PCN reaction causing immediate rash, facial/tongue/throat swelling, SOB or lightheadedness with hypotension: yes Has patient had a PCN reaction causing severe rash involving mucus membranes or skin necrosis:  no Has patient had a PCN reaction that required hospitalization:  no Has patient had a PCN reaction occurring within the last 10 years: no If  all of the above answers are "NO", then may proceed with Cephalosporin use.   Sulfamethoxazole-trimethoprim Hives   Band-aid Plus Antibiotic [bacitracin-polymyxin B] Dermatitis   Benazepril Swelling   Possible cause of tongue swelling   Other Rash   Band-aid        Medication List     STOP taking these medications    cefdinir 300 MG capsule Commonly known as: OMNICEF   clopidogrel 75 MG tablet Commonly known as: PLAVIX   polyethylene glycol 17 g packet Commonly known as: MIRALAX / GLYCOLAX       TAKE these medications    acetaminophen 325 MG tablet Commonly known as: TYLENOL Take 2 tablets (650 mg total) by mouth every 6 (six) hours as needed for mild pain (or Fever >/= 101).   amLODipine 10 MG tablet Commonly known as: NORVASC Take 1 tablet (10 mg total) by mouth daily.   ascorbic acid 500 MG tablet Commonly known as: VITAMIN C Take 1 tablet (500 mg total) by mouth daily.   aspirin 81 MG chewable tablet Chew 1 tablet (81 mg total) by mouth daily.   atorvastatin 80 MG tablet Commonly known as: LIPITOR Take 1 tablet (80 mg total) by mouth at bedtime. What changed: when to take this   buPROPion 150 MG 24 hr tablet Commonly known as: WELLBUTRIN XL Take 1 tablet (150 mg total) by mouth every morning. What changed: when to take this   cholecalciferol 25 MCG (1000 UNIT) tablet Commonly known as: VITAMIN D3 Take 1 tablet (1,000 Units total) by mouth daily.   diclofenac Sodium 1 % Gel Commonly known as: VOLTAREN Apply 2 g topically 4 (four) times daily as needed (shoulder pain).   docusate sodium 100 MG capsule Commonly known as: Colace Take 1 capsule (100 mg total) by mouth 2 (two) times daily.   ferrous sulfate 325 (65 FE) MG tablet Take 1 tablet (325 mg total) by mouth daily with breakfast.   folic acid 1 MG tablet Commonly known as: FOLVITE Take 2 tablets (2 mg total) by mouth daily.   gabapentin 100 MG capsule Commonly known as: NEURONTIN Take  100 mg by mouth at bedtime.   lactulose 10 GM/15ML solution Commonly known as: CHRONULAC Take 45 mLs (30 g total) by mouth 2 (two) times daily as needed for mild constipation or moderate constipation.   levothyroxine 75 MCG tablet Commonly known as: SYNTHROID Take 1 tablet (75 mcg total) by mouth daily.   OCUVITE ADULT FORMULA PO Take 1 tablet by mouth daily.   pantoprazole 40 MG tablet Commonly known as: PROTONIX Take 1 tablet (40 mg total) by mouth 2 (two) times daily.   potassium chloride 10 MEQ tablet Commonly known as: KLOR-CON M Take 1 tablet (10 mEq total) by mouth 3 (three) times daily.   venlafaxine XR 75 MG 24 hr capsule Commonly known as: EFFEXOR-XR Take 3 capsules (225 mg total) by mouth daily.        Allergies  Allergen Reactions   Advil [Ibuprofen] Hives and Itching   Codeine Hives   Penicillins Hives    Has patient had a PCN reaction causing immediate rash, facial/tongue/throat swelling, SOB or lightheadedness  with hypotension: yes Has patient had a PCN reaction causing severe rash involving mucus membranes or skin necrosis:  no Has patient had a PCN reaction that required hospitalization:  no Has patient had a PCN reaction occurring within the last 10 years: no If all of the above answers are "NO", then may proceed with Cephalosporin use. Has patient had a PCN reaction causing immediate rash, facial/tongue/throat swelling, SOB or lightheadedness with hypotension: yes Has patient had a PCN reaction causing severe rash involving mucus membranes or skin necrosis:  no Has patient had a PCN reaction that required hospitalization:  no Has patient had a PCN reaction occurring within the last 10 years: no If all of the above answers are "NO", then may proceed with Cephalosporin use.    Sulfamethoxazole-Trimethoprim Hives   Band-Aid Plus Antibiotic [Bacitracin-Polymyxin B] Dermatitis   Benazepril Swelling    Possible cause of tongue swelling   Other Rash     Band-aid    Consultations: None   Procedures/Studies: MR BRAIN WO CONTRAST  Result Date: 06/11/2022 CLINICAL DATA:  Mental status change, persistent or worsening CT head EXAM: MRI HEAD WITHOUT CONTRAST TECHNIQUE: Multiplanar, multiecho pulse sequences of the brain and surrounding structures were obtained without intravenous contrast. COMPARISON:  CT head 06/10/2022. FINDINGS: Brain: No acute infarction, hemorrhage, hydrocephalus, extra-axial collection or mass lesion. Chronic basal ganglia lacunar infarcts. Chronic microvascular ischemic disease. Vascular: Major arterial flow voids are maintained at the skull base. Skull and upper cervical spine: Normal marrow signal. Sinuses/Orbits: Clear sinuses.  No acute orbital findings. Other: No mastoid effusions. IMPRESSION: No evidence of acute intracranial abnormality. Electronically Signed   By: Margaretha Sheffield M.D.   On: 06/11/2022 11:16   CT Head Wo Contrast  Result Date: 06/10/2022 CLINICAL DATA:  Altered mental status EXAM: CT HEAD WITHOUT CONTRAST TECHNIQUE: Contiguous axial images were obtained from the base of the skull through the vertex without intravenous contrast. RADIATION DOSE REDUCTION: This exam was performed according to the departmental dose-optimization program which includes automated exposure control, adjustment of the mA and/or kV according to patient size and/or use of iterative reconstruction technique. COMPARISON:  CT head Mar 20, 2022.  MRI head Mar 21, 2022 FINDINGS: Brain: Chronic infarcts in the basal ganglia bilaterally. Mild white matter hypodensity bilaterally. Negative for acute infarct, hemorrhage, mass Vascular: Negative for hyperdense vessel Skull: Negative Sinuses/Orbits: Mucosal edema paranasal sinuses. Bilateral cataract extraction Other: None IMPRESSION: No acute abnormality Chronic infarcts in the basal ganglia bilaterally. Mild chronic microvascular ischemic change in the white matter. Electronically Signed   By:  Franchot Gallo M.D.   On: 06/10/2022 17:41   CT Abdomen Pelvis W Contrast  Result Date: 06/10/2022 CLINICAL DATA:  Altered level of consciousness, recent gastrointestinal bleeding EXAM: CT ABDOMEN AND PELVIS WITH CONTRAST TECHNIQUE: Multidetector CT imaging of the abdomen and pelvis was performed using the standard protocol following bolus administration of intravenous contrast. RADIATION DOSE REDUCTION: This exam was performed according to the departmental dose-optimization program which includes automated exposure control, adjustment of the mA and/or kV according to patient size and/or use of iterative reconstruction technique. CONTRAST:  121m OMNIPAQUE IOHEXOL 300 MG/ML  SOLN COMPARISON:  None Available. FINDINGS: Lower chest: No acute pleural or parenchymal lung disease. Hepatobiliary: No focal liver abnormality is seen. No gallstones, gallbladder wall thickening, or biliary dilatation. Pancreas: Unremarkable. No pancreatic ductal dilatation or surrounding inflammatory changes. Spleen: Normal in size without focal abnormality. Adrenals/Urinary Tract: The kidneys enhance normally and symmetrically. The adrenals are unremarkable. Bladder is  decompressed, with no gross abnormality. Stomach/Bowel: No bowel obstruction or ileus. Normal appendix right lower quadrant. Large amount of retained stool within the ascending and transverse colon consistent with constipation. No bowel wall thickening or inflammatory change. Small hiatal hernia. Large diverticulum off the gastric fundus. Vascular/Lymphatic: Aortic atherosclerosis. No enlarged abdominal or pelvic lymph nodes. Reproductive: Status post hysterectomy. No adnexal masses. Other: No free fluid or free intraperitoneal gas. No abdominal wall hernia. Musculoskeletal: No acute or destructive bony lesions. Reconstructed images demonstrate no additional findings. IMPRESSION: 1. No acute intra-abdominal or intrapelvic process. 2. Significant fecal retention within the  proximal colon consistent with constipation. No obstruction or ileus. 3. Small hiatal hernia. 4.  Aortic Atherosclerosis (ICD10-I70.0). Electronically Signed   By: Randa Ngo M.D.   On: 06/10/2022 17:41   (Echo, Carotid, EGD, Colonoscopy, ERCP)    Subjective: Patient seen and examined.  After much intervention today morning she had large bowel movement and relieved.  Denies any other complaints.  Alert awake.  Eager to go home.   Discharge Exam: Vitals:   06/13/22 0414 06/13/22 0755  BP: (!) 143/66 (!) 145/86  Pulse: 71 90  Resp: 16 18  Temp: 98.5 F (36.9 C) 98.2 F (36.8 C)  SpO2: 96% 96%   Vitals:   06/12/22 1957 06/12/22 2316 06/13/22 0414 06/13/22 0755  BP: 135/67 (!) 147/68 (!) 143/66 (!) 145/86  Pulse: 73 76 71 90  Resp: '20 17 16 18  '$ Temp: 98.5 F (36.9 C) 98.7 F (37.1 C) 98.5 F (36.9 C) 98.2 F (36.8 C)  TempSrc: Oral Oral Oral   SpO2: 94%  96% 96%  Weight:      Height:        General: Pt is alert, awake, not in acute distress, frail and debilitated.  But not in any distress. Cardiovascular: RRR, S1/S2 +, no rubs, no gallops Respiratory: CTA bilaterally, no wheezing, no rhonchi Abdominal: Soft, NT, ND, bowel sounds + Extremities: no edema, no cyanosis    The results of significant diagnostics from this hospitalization (including imaging, microbiology, ancillary and laboratory) are listed below for reference.     Microbiology: Recent Results (from the past 240 hour(s))  Urine Culture     Status: Abnormal   Collection Time: 06/10/22 12:12 PM   Specimen: Urine, Clean Catch  Result Value Ref Range Status   Specimen Description URINE, CLEAN CATCH  Final   Special Requests   Final    NONE Performed at [facility], [address], [place], [place] [address]    Culture MULTIPLE SPECIES PRESENT, SUGGEST RECOLLECTION (A)  Final   Report Status 06/12/2022 FINAL  Final     Labs: BNP (last 3 results) No results for input(s): "BNP" in the last 8760  hours. Basic Metabolic Panel: Recent Labs  Lab 06/10/22 1305 06/11/22 0155  NA 140 138  K 3.7 3.5  CL 105 103  CO2 27 24  GLUCOSE 116* 132*  BUN 10 8  CREATININE 0.64 0.71  CALCIUM 9.4 9.4   Liver Function Tests: Recent Labs  Lab 06/10/22 1305  AST 27  ALT 36  ALKPHOS 75  BILITOT 0.5  PROT 6.9  ALBUMIN 3.6   Recent Labs  Lab 06/10/22 1556  LIPASE 46   Recent Labs  Lab 06/10/22 2030  AMMONIA 19   CBC: Recent Labs  Lab 06/10/22 1305 06/11/22 0155  WBC 5.3 7.0  NEUTROABS 3.3  --   HGB 10.7* 10.3*  HCT 34.8* 33.3*  MCV 95.3  96.8  PLT 308 282   Cardiac Enzymes: No results for input(s): "CKTOTAL", "CKMB", "CKMBINDEX", "TROPONINI" in the last 168 hours. BNP: Invalid input(s): "POCBNP" CBG: No results for input(s): "GLUCAP" in the last 168 hours. D-Dimer No results for input(s): "DDIMER" in the last 72 hours. Hgb A1c No results for input(s): "HGBA1C" in the last 72 hours. Lipid Profile No results for input(s): "CHOL", "HDL", "LDLCALC", "TRIG", "CHOLHDL", "LDLDIRECT" in the last 72 hours. Thyroid function studies Recent Labs    06/10/22 2030  TSH 0.392   Anemia work up Recent Labs    06/10/22 2030  VITAMINB12 572   Urinalysis    Component Value Date/Time   COLORURINE YELLOW 06/10/2022 1212   APPEARANCEUR HAZY (A) 06/10/2022 1212   LABSPEC 1.017 06/10/2022 1212   PHURINE 6.0 06/10/2022 1212   GLUCOSEU NEGATIVE 06/10/2022 1212   HGBUR NEGATIVE 06/10/2022 1212   BILIRUBINUR NEGATIVE 06/10/2022 1212   KETONESUR NEGATIVE 06/10/2022 1212   PROTEINUR NEGATIVE 06/10/2022 1212   NITRITE NEGATIVE 06/10/2022 1212   LEUKOCYTESUR MODERATE (A) 06/10/2022 1212   Sepsis Labs Recent Labs  Lab 06/10/22 1305 06/11/22 0155  WBC 5.3 7.0   Microbiology Recent Results (from the past 240 hour(s))  Urine Culture     Status: Abnormal   Collection Time: 06/10/22 12:12 PM   Specimen: Urine, Clean Catch  Result Value Ref Range Status   Specimen  Description URINE, CLEAN CATCH  Final   Special Requests   Final    NONE Performed at [facility], [place] [address], [place], [place] [address]    Culture MULTIPLE SPECIES PRESENT, SUGGEST RECOLLECTION (A)  Final   Report Status 06/12/2022 FINAL  Final     Time coordinating discharge: 35 minutes  SIGNED:   Barb Merino, MD  [facility] 06/13/2022, 8:15 AM

## 2022-06-16 DIAGNOSIS — N390 Urinary tract infection, site not specified: Secondary | ICD-10-CM | POA: Diagnosis not present

## 2022-06-16 DIAGNOSIS — I4510 Unspecified right bundle-branch block: Secondary | ICD-10-CM | POA: Diagnosis not present

## 2022-06-16 DIAGNOSIS — E039 Hypothyroidism, unspecified: Secondary | ICD-10-CM | POA: Diagnosis not present

## 2022-06-16 DIAGNOSIS — K922 Gastrointestinal hemorrhage, unspecified: Secondary | ICD-10-CM | POA: Diagnosis not present

## 2022-06-16 DIAGNOSIS — I69354 Hemiplegia and hemiparesis following cerebral infarction affecting left non-dominant side: Secondary | ICD-10-CM | POA: Diagnosis not present

## 2022-06-16 DIAGNOSIS — K219 Gastro-esophageal reflux disease without esophagitis: Secondary | ICD-10-CM | POA: Diagnosis not present

## 2022-06-16 DIAGNOSIS — E785 Hyperlipidemia, unspecified: Secondary | ICD-10-CM | POA: Diagnosis not present

## 2022-06-16 DIAGNOSIS — Z7982 Long term (current) use of aspirin: Secondary | ICD-10-CM | POA: Diagnosis not present

## 2022-06-16 DIAGNOSIS — Z853 Personal history of malignant neoplasm of breast: Secondary | ICD-10-CM | POA: Diagnosis not present

## 2022-06-16 DIAGNOSIS — B9561 Methicillin susceptible Staphylococcus aureus infection as the cause of diseases classified elsewhere: Secondary | ICD-10-CM | POA: Diagnosis not present

## 2022-06-16 DIAGNOSIS — D500 Iron deficiency anemia secondary to blood loss (chronic): Secondary | ICD-10-CM | POA: Diagnosis not present

## 2022-06-16 DIAGNOSIS — Z9181 History of falling: Secondary | ICD-10-CM | POA: Diagnosis not present

## 2022-06-16 DIAGNOSIS — I10 Essential (primary) hypertension: Secondary | ICD-10-CM | POA: Diagnosis not present

## 2022-06-16 DIAGNOSIS — I69320 Aphasia following cerebral infarction: Secondary | ICD-10-CM | POA: Diagnosis not present

## 2022-06-16 DIAGNOSIS — Z791 Long term (current) use of non-steroidal anti-inflammatories (NSAID): Secondary | ICD-10-CM | POA: Diagnosis not present

## 2022-06-17 DIAGNOSIS — Z09 Encounter for follow-up examination after completed treatment for conditions other than malignant neoplasm: Secondary | ICD-10-CM | POA: Diagnosis not present

## 2022-06-17 DIAGNOSIS — N390 Urinary tract infection, site not specified: Secondary | ICD-10-CM | POA: Diagnosis not present

## 2022-06-17 DIAGNOSIS — D508 Other iron deficiency anemias: Secondary | ICD-10-CM | POA: Diagnosis not present

## 2022-06-17 DIAGNOSIS — R531 Weakness: Secondary | ICD-10-CM | POA: Diagnosis not present

## 2022-06-17 DIAGNOSIS — K649 Unspecified hemorrhoids: Secondary | ICD-10-CM | POA: Diagnosis not present

## 2022-06-19 DIAGNOSIS — B9561 Methicillin susceptible Staphylococcus aureus infection as the cause of diseases classified elsewhere: Secondary | ICD-10-CM | POA: Diagnosis not present

## 2022-06-19 DIAGNOSIS — Z791 Long term (current) use of non-steroidal anti-inflammatories (NSAID): Secondary | ICD-10-CM | POA: Diagnosis not present

## 2022-06-19 DIAGNOSIS — D500 Iron deficiency anemia secondary to blood loss (chronic): Secondary | ICD-10-CM | POA: Diagnosis not present

## 2022-06-19 DIAGNOSIS — N390 Urinary tract infection, site not specified: Secondary | ICD-10-CM | POA: Diagnosis not present

## 2022-06-19 DIAGNOSIS — E039 Hypothyroidism, unspecified: Secondary | ICD-10-CM | POA: Diagnosis not present

## 2022-06-19 DIAGNOSIS — Z9181 History of falling: Secondary | ICD-10-CM | POA: Diagnosis not present

## 2022-06-19 DIAGNOSIS — Z7982 Long term (current) use of aspirin: Secondary | ICD-10-CM | POA: Diagnosis not present

## 2022-06-19 DIAGNOSIS — Z853 Personal history of malignant neoplasm of breast: Secondary | ICD-10-CM | POA: Diagnosis not present

## 2022-06-19 DIAGNOSIS — I4510 Unspecified right bundle-branch block: Secondary | ICD-10-CM | POA: Diagnosis not present

## 2022-06-19 DIAGNOSIS — E785 Hyperlipidemia, unspecified: Secondary | ICD-10-CM | POA: Diagnosis not present

## 2022-06-19 DIAGNOSIS — K219 Gastro-esophageal reflux disease without esophagitis: Secondary | ICD-10-CM | POA: Diagnosis not present

## 2022-06-19 DIAGNOSIS — I69320 Aphasia following cerebral infarction: Secondary | ICD-10-CM | POA: Diagnosis not present

## 2022-06-19 DIAGNOSIS — K922 Gastrointestinal hemorrhage, unspecified: Secondary | ICD-10-CM | POA: Diagnosis not present

## 2022-06-19 DIAGNOSIS — I69354 Hemiplegia and hemiparesis following cerebral infarction affecting left non-dominant side: Secondary | ICD-10-CM | POA: Diagnosis not present

## 2022-06-19 DIAGNOSIS — I10 Essential (primary) hypertension: Secondary | ICD-10-CM | POA: Diagnosis not present

## 2022-06-22 DIAGNOSIS — M19012 Primary osteoarthritis, left shoulder: Secondary | ICD-10-CM | POA: Diagnosis not present

## 2022-06-22 DIAGNOSIS — M19011 Primary osteoarthritis, right shoulder: Secondary | ICD-10-CM | POA: Diagnosis not present

## 2022-06-23 DIAGNOSIS — D500 Iron deficiency anemia secondary to blood loss (chronic): Secondary | ICD-10-CM | POA: Diagnosis not present

## 2022-06-23 DIAGNOSIS — K219 Gastro-esophageal reflux disease without esophagitis: Secondary | ICD-10-CM | POA: Diagnosis not present

## 2022-06-23 DIAGNOSIS — Z853 Personal history of malignant neoplasm of breast: Secondary | ICD-10-CM | POA: Diagnosis not present

## 2022-06-23 DIAGNOSIS — N390 Urinary tract infection, site not specified: Secondary | ICD-10-CM | POA: Diagnosis not present

## 2022-06-23 DIAGNOSIS — K922 Gastrointestinal hemorrhage, unspecified: Secondary | ICD-10-CM | POA: Diagnosis not present

## 2022-06-23 DIAGNOSIS — E785 Hyperlipidemia, unspecified: Secondary | ICD-10-CM | POA: Diagnosis not present

## 2022-06-23 DIAGNOSIS — I10 Essential (primary) hypertension: Secondary | ICD-10-CM | POA: Diagnosis not present

## 2022-06-23 DIAGNOSIS — Z791 Long term (current) use of non-steroidal anti-inflammatories (NSAID): Secondary | ICD-10-CM | POA: Diagnosis not present

## 2022-06-23 DIAGNOSIS — I69320 Aphasia following cerebral infarction: Secondary | ICD-10-CM | POA: Diagnosis not present

## 2022-06-23 DIAGNOSIS — I4510 Unspecified right bundle-branch block: Secondary | ICD-10-CM | POA: Diagnosis not present

## 2022-06-23 DIAGNOSIS — I69354 Hemiplegia and hemiparesis following cerebral infarction affecting left non-dominant side: Secondary | ICD-10-CM | POA: Diagnosis not present

## 2022-06-23 DIAGNOSIS — B9561 Methicillin susceptible Staphylococcus aureus infection as the cause of diseases classified elsewhere: Secondary | ICD-10-CM | POA: Diagnosis not present

## 2022-06-23 DIAGNOSIS — E039 Hypothyroidism, unspecified: Secondary | ICD-10-CM | POA: Diagnosis not present

## 2022-06-23 DIAGNOSIS — Z7982 Long term (current) use of aspirin: Secondary | ICD-10-CM | POA: Diagnosis not present

## 2022-06-23 DIAGNOSIS — Z9181 History of falling: Secondary | ICD-10-CM | POA: Diagnosis not present

## 2022-06-24 ENCOUNTER — Ambulatory Visit: Payer: Medicare Other | Admitting: Internal Medicine

## 2022-06-24 ENCOUNTER — Encounter: Payer: Self-pay | Admitting: Internal Medicine

## 2022-06-24 VITALS — BP 142/70 | HR 80 | Ht 63.0 in | Wt 170.5 lb

## 2022-06-24 DIAGNOSIS — K5900 Constipation, unspecified: Secondary | ICD-10-CM | POA: Diagnosis not present

## 2022-06-24 DIAGNOSIS — D509 Iron deficiency anemia, unspecified: Secondary | ICD-10-CM

## 2022-06-24 NOTE — Progress Notes (Signed)
HISTORY OF PRESENT ILLNESS:  Jessica [name] is a 81 y.o. female, patient of Dr. Fuller Plan, inadvertently placed on my schedule after recent hospitalization for iron deficiency anemia and Hemoccult positive stool.  She is accompanied today by her daughter.  She has a history of tubulovillous adenoma on colonoscopy with Dr. Fuller Plan in 2015.  At the time of her hospital admission on May 28, 2022 she was on aspirin and Plavix for history of CVA.  She also has a history of breast cancer for which she is followed by Dr. Marin Olp.  When she presented to the hospital she reported dark stools of approximately 1 weeks duration.  Her admission hemoglobin was approximately 7 .3 with MCV 86.2.  Iron studies were consistent with iron deficiency with ferritin level of 7 and iron saturation 4%.  She was given iron infusion.  Plavix washout.  Subsequent upper endoscopy with Dr. Hilarie Fredrickson on May 29, 2022.  She was found to have a Schatzki's ring, small hiatal hernia, incidental gastric diverticulum.  No significant mucosal lesions.  Subsequent colonoscopy with Dr. Candis Schatz was performed June 03, 2022.  She had a few diminutive adenomas removed.  The ileum was intubated and appeared normal.  Visualization of the cecum was suboptimal but no obvious mass.  She is no longer on Plavix.  She was discharged on June 05, 2022.  Last hemoglobin June 11, 2022 was 10.3.  She did undergo a contrast-enhanced CT scan of the abdomen and pelvis June 10, 2022 no acute abnormalities.  Her daughter is in excellent historian and helps provide interval details.  Patient is currently taking iron sulfate once daily.  She is having issues with constipation, moving her bowels approximately once every 3 to 4 days.  Apparently, this can be uncomfortable at times.  She has taken MiraLAX in the past (1 dose daily) without results.  Currently taking Colace 100 mg twice daily.  No bleeding.  I am told that she has follow-up CBC with her PCP this Friday.  As an  aside, the daughter did inform me that the patient had difficulty completing her prep due to her lack of mobility and the fact that hospital nursing had the prep located in an area of the room that was not accessible to the patient.  REVIEW OF SYSTEMS:  All non-GI ROS negative unless otherwise stated in the HPI except for urinary leakage, voice change, confusion, depression, fatigue, shoulder pain (underwent injections for pain relief recently)  Past Medical History:  Diagnosis Date   Allergy    Anemia    Anxiety    Asthma    in past, no inhalers now   Blood transfusion without reported diagnosis    Breast cancer ([place]) 01/2017   left breast    Breast cancer of upper-inner quadrant of left female breast ([place]) 03/11/2017   Cancer ([place]) 01/2017   left breast   Cataract    bilateral removed   Chronic kidney disease    kidney stones   Depression 08/31/2013   Dyslipidemia, goal LDL below 160 08/31/2013   Essential hypertension - well controlled 08/31/2013   Family history of breast cancer    Family history of melanoma    Family history of prostate cancer    GERD (gastroesophageal reflux disease)    Goals of care, counseling/discussion 03/11/2017   History of radiation therapy 10/13/17-11/11/17   left breast 40.05 Gy in 15 fractions, left breast boost 10 Gy in 5 fractions   Hypothyroidism    Ischemic stroke (  [facility])    May 2023;   Obesity (BMI 30-39.9) 08/31/2013   Personal history of chemotherapy 2018   Personal history of radiation therapy 2019   Thyroid disease    Vasovagal near-syncope -rare 08/31/2013    Past Surgical History:  Procedure Laterality Date   ABDOMINAL HYSTERECTOMY     total   BREAST BIOPSY Left 02/24/2017    malignant   BREAST LUMPECTOMY Left 03/23/2017   broken fingers     CATARACT EXTRACTION Bilateral    COLONOSCOPY     COLONOSCOPY WITH PROPOFOL N/A 06/03/2022   Procedure: COLONOSCOPY WITH PROPOFOL;  Surgeon: [name], MD;  Location: [facility];  Service: Gastroenterology;  Laterality: N/A;   ESOPHAGOGASTRODUODENOSCOPY (EGD) WITH PROPOFOL N/A 05/29/2022   Procedure: ESOPHAGOGASTRODUODENOSCOPY (EGD) WITH PROPOFOL;  Surgeon: Jerene Bears, MD;  Location: [facility];  Service: Gastroenterology;  Laterality: N/A;   EXCISIONAL HEMORRHOIDECTOMY     IR FLUORO GUIDE PORT INSERTION RIGHT  08/04/2017   IR REMOVAL TUN ACCESS W/ PORT W/O FL MOD SED  08/04/2017   IR US GUIDE VASC ACCESS RIGHT  08/04/2017   IRRIGATION AND DEBRIDEMENT ABSCESS Left 05/18/2017   Procedure: IRRIGATION AND DEBRIDEMENT LEFT AXILLARY ABSCESS;  Surgeon: Michael Boston, MD;  Location: [facility];  Service: General;  Laterality: Left;   kidney stones lithotripsy     POLYPECTOMY  06/03/2022   Procedure: POLYPECTOMY;  Surgeon: [name], MD;  Location: [facility];  Service: Gastroenterology;;   PORTACATH PLACEMENT Right 03/23/2017   Procedure: INSERTION PORT-A-CATH;  Surgeon: Erroll Luna, MD;  Location: [place];  Service: General;  Laterality: Right;   RADIOACTIVE SEED GUIDED PARTIAL MASTECTOMY WITH AXILLARY SENTINEL LYMPH NODE BIOPSY Left 03/23/2017   Procedure: LEFT BREAST RADIOACTIVE SEED GUIDED PARTIAL MASTECTOMY AND  SENTINEL LYMPH NODE [place];  Surgeon: Erroll Luna, MD;  Location: [place];  Service: General;  Laterality: Left;   ROTATOR CUFF REPAIR     left    TONSILLECTOMY     WISDOM TOOTH EXTRACTION      Social History ANZLEE HINESLEY  reports that she has never smoked. She has been exposed to tobacco smoke. She has never used smokeless tobacco. She reports that she does not drink alcohol and does not use drugs.  family history includes Bone cancer in her cousin; Breast cancer in her paternal aunt; Cervical cancer in her maternal aunt; Heart attack in her brother; Heart disease in her father and mother; Lung cancer in her cousin; Prostate cancer in her brother.  Allergies  Allergen Reactions   Advil  [Ibuprofen] Hives and Itching   Codeine Hives   Penicillins Hives    Has patient had a PCN reaction causing immediate rash, facial/tongue/throat swelling, SOB or lightheadedness with hypotension: yes Has patient had a PCN reaction causing severe rash involving mucus membranes or skin necrosis:  no Has patient had a PCN reaction that required hospitalization:  no Has patient had a PCN reaction occurring within the last 10 years: no If all of the above answers are "NO", then may proceed with Cephalosporin use. Has patient had a PCN reaction causing immediate rash, facial/tongue/throat swelling, SOB or lightheadedness with hypotension: yes Has patient had a PCN reaction causing severe rash involving mucus membranes or skin necrosis:  no Has patient had a PCN reaction that required hospitalization:  no Has patient had a PCN reaction occurring within the last 10 years: no If all of the above answers are "NO", then may proceed  with Cephalosporin use.    Sulfamethoxazole-Trimethoprim Hives   Crestor [Rosuvastatin Calcium]     Other reaction(s): pain   Band-Aid Plus Antibiotic [Bacitracin-Polymyxin B] Dermatitis   Benazepril Swelling    Possible cause of tongue swelling   Other Rash    Band-aid       PHYSICAL EXAMINATION: Vital signs: BP (!) 142/70 (BP Location: Left Arm, Patient Position: Sitting, Cuff Size: Normal)   Pulse 80   Ht $R'5\' 3"'co$  (1.6 m)   Wt 170 lb 8 oz (77.3 kg)   BMI 30.20 kg/m   Constitutional: Elderly, chronically ill-appearing, but no acute distress.  In wheelchair Psychiatric: alert and oriented x3, cooperative Eyes: Anicteric Mouth: oral pharynx moist, no lesions Neck: supple no lymphadenopathy Cardiovascular: heart regular rate and rhythm, no murmur Lungs: clear to auscultation bilaterally Abdomen: soft, nontender, nondistended, no obvious ascites, no peritoneal signs, normal bowel sounds, no organomegaly Rectal: Omitted Extremities: no clubbing, cyanosis, or lower  extremity edema bilaterally Skin: no lesions on visible extremities Neuro: Right-sided weakness, speech deficit  ASSESSMENT:  1.  Recent hospitalization for symptomatic iron deficiency anemia with Hemoccult positive stool.  Work-up as described 2.  Constipation 3.  Multiple significant medical problems 4.  Advanced age   PLAN:  50.  Continue oral iron 2.  Add MiraLAX to daily Colace.  I instructed the patient's daughter the proper way to titrate. 3.  PCP to monitor blood counts and iron levels 4.  For recurrent iron deficiency anemia, could consider capsule endoscopy.  I will defer any future decisions, in this regard, to her primary gastroenterologist, Dr. Fuller Plan.  She will follow-up with him as needed.  I will copy him on this note. A total time of 35 minutes was spent preparing to see the patient, reviewing a myriad of data and records, taking history, performing medically appropriate physical exam, counseling the patient and her daughter regarding her above listed issues, directing therapies for constipation and iron deficiency, and documenting clinical information in the health record

## 2022-06-24 NOTE — Patient Instructions (Signed)
_______________________________________________________  If you are age 81 or older, your body mass index should be between 23-30. Your Body mass index is 30.2 kg/m. If this is out of the aforementioned range listed, please consider follow up with your Primary Care Provider.  If you are age 17 or younger, your body mass index should be between 19-25. Your Body mass index is 30.2 kg/m. If this is out of the aformentioned range listed, please consider follow up with your Primary Care Provider.   ________________________________________________________  The [place] [facility] providers would like to encourage you to use [facility] to communicate with providers for non-urgent requests or questions.  Due to long hold times on the telephone, sending your provider a message by [facility] may be a faster and more efficient way to get a response.  Please allow 48 business hours for a response.  Please remember that this is for non-urgent requests.  _______________________________________________________  Loletha Grayer as needed for your constipation.  Please follow up as needed

## 2022-06-25 DIAGNOSIS — Z853 Personal history of malignant neoplasm of breast: Secondary | ICD-10-CM | POA: Diagnosis not present

## 2022-06-25 DIAGNOSIS — Z9181 History of falling: Secondary | ICD-10-CM | POA: Diagnosis not present

## 2022-06-25 DIAGNOSIS — N390 Urinary tract infection, site not specified: Secondary | ICD-10-CM | POA: Diagnosis not present

## 2022-06-25 DIAGNOSIS — I69320 Aphasia following cerebral infarction: Secondary | ICD-10-CM | POA: Diagnosis not present

## 2022-06-25 DIAGNOSIS — Z7982 Long term (current) use of aspirin: Secondary | ICD-10-CM | POA: Diagnosis not present

## 2022-06-25 DIAGNOSIS — I10 Essential (primary) hypertension: Secondary | ICD-10-CM | POA: Diagnosis not present

## 2022-06-25 DIAGNOSIS — K219 Gastro-esophageal reflux disease without esophagitis: Secondary | ICD-10-CM | POA: Diagnosis not present

## 2022-06-25 DIAGNOSIS — B9561 Methicillin susceptible Staphylococcus aureus infection as the cause of diseases classified elsewhere: Secondary | ICD-10-CM | POA: Diagnosis not present

## 2022-06-25 DIAGNOSIS — K922 Gastrointestinal hemorrhage, unspecified: Secondary | ICD-10-CM | POA: Diagnosis not present

## 2022-06-25 DIAGNOSIS — Z791 Long term (current) use of non-steroidal anti-inflammatories (NSAID): Secondary | ICD-10-CM | POA: Diagnosis not present

## 2022-06-25 DIAGNOSIS — I69354 Hemiplegia and hemiparesis following cerebral infarction affecting left non-dominant side: Secondary | ICD-10-CM | POA: Diagnosis not present

## 2022-06-25 DIAGNOSIS — I4510 Unspecified right bundle-branch block: Secondary | ICD-10-CM | POA: Diagnosis not present

## 2022-06-25 DIAGNOSIS — E039 Hypothyroidism, unspecified: Secondary | ICD-10-CM | POA: Diagnosis not present

## 2022-06-25 DIAGNOSIS — D500 Iron deficiency anemia secondary to blood loss (chronic): Secondary | ICD-10-CM | POA: Diagnosis not present

## 2022-06-25 DIAGNOSIS — E785 Hyperlipidemia, unspecified: Secondary | ICD-10-CM | POA: Diagnosis not present

## 2022-06-26 DIAGNOSIS — Z791 Long term (current) use of non-steroidal anti-inflammatories (NSAID): Secondary | ICD-10-CM | POA: Diagnosis not present

## 2022-06-26 DIAGNOSIS — K922 Gastrointestinal hemorrhage, unspecified: Secondary | ICD-10-CM | POA: Diagnosis not present

## 2022-06-26 DIAGNOSIS — I69320 Aphasia following cerebral infarction: Secondary | ICD-10-CM | POA: Diagnosis not present

## 2022-06-26 DIAGNOSIS — Z8673 Personal history of transient ischemic attack (TIA), and cerebral infarction without residual deficits: Secondary | ICD-10-CM | POA: Diagnosis not present

## 2022-06-26 DIAGNOSIS — N3020 Other chronic cystitis without hematuria: Secondary | ICD-10-CM | POA: Diagnosis not present

## 2022-06-26 DIAGNOSIS — E039 Hypothyroidism, unspecified: Secondary | ICD-10-CM | POA: Diagnosis not present

## 2022-06-26 DIAGNOSIS — E785 Hyperlipidemia, unspecified: Secondary | ICD-10-CM | POA: Diagnosis not present

## 2022-06-26 DIAGNOSIS — B9561 Methicillin susceptible Staphylococcus aureus infection as the cause of diseases classified elsewhere: Secondary | ICD-10-CM | POA: Diagnosis not present

## 2022-06-26 DIAGNOSIS — I69354 Hemiplegia and hemiparesis following cerebral infarction affecting left non-dominant side: Secondary | ICD-10-CM | POA: Diagnosis not present

## 2022-06-26 DIAGNOSIS — D500 Iron deficiency anemia secondary to blood loss (chronic): Secondary | ICD-10-CM | POA: Diagnosis not present

## 2022-06-26 DIAGNOSIS — I4510 Unspecified right bundle-branch block: Secondary | ICD-10-CM | POA: Diagnosis not present

## 2022-06-26 DIAGNOSIS — K5900 Constipation, unspecified: Secondary | ICD-10-CM | POA: Diagnosis not present

## 2022-06-26 DIAGNOSIS — Z7982 Long term (current) use of aspirin: Secondary | ICD-10-CM | POA: Diagnosis not present

## 2022-06-26 DIAGNOSIS — K219 Gastro-esophageal reflux disease without esophagitis: Secondary | ICD-10-CM | POA: Diagnosis not present

## 2022-06-26 DIAGNOSIS — Z9181 History of falling: Secondary | ICD-10-CM | POA: Diagnosis not present

## 2022-06-26 DIAGNOSIS — Z853 Personal history of malignant neoplasm of breast: Secondary | ICD-10-CM | POA: Diagnosis not present

## 2022-06-26 DIAGNOSIS — I10 Essential (primary) hypertension: Secondary | ICD-10-CM | POA: Diagnosis not present

## 2022-06-26 DIAGNOSIS — I69392 Facial weakness following cerebral infarction: Secondary | ICD-10-CM | POA: Diagnosis not present

## 2022-06-26 DIAGNOSIS — N390 Urinary tract infection, site not specified: Secondary | ICD-10-CM | POA: Diagnosis not present

## 2022-06-30 DIAGNOSIS — E785 Hyperlipidemia, unspecified: Secondary | ICD-10-CM | POA: Diagnosis not present

## 2022-06-30 DIAGNOSIS — K219 Gastro-esophageal reflux disease without esophagitis: Secondary | ICD-10-CM | POA: Diagnosis not present

## 2022-06-30 DIAGNOSIS — D500 Iron deficiency anemia secondary to blood loss (chronic): Secondary | ICD-10-CM | POA: Diagnosis not present

## 2022-06-30 DIAGNOSIS — I4510 Unspecified right bundle-branch block: Secondary | ICD-10-CM | POA: Diagnosis not present

## 2022-06-30 DIAGNOSIS — N390 Urinary tract infection, site not specified: Secondary | ICD-10-CM | POA: Diagnosis not present

## 2022-06-30 DIAGNOSIS — I69354 Hemiplegia and hemiparesis following cerebral infarction affecting left non-dominant side: Secondary | ICD-10-CM | POA: Diagnosis not present

## 2022-06-30 DIAGNOSIS — Z791 Long term (current) use of non-steroidal anti-inflammatories (NSAID): Secondary | ICD-10-CM | POA: Diagnosis not present

## 2022-06-30 DIAGNOSIS — B9561 Methicillin susceptible Staphylococcus aureus infection as the cause of diseases classified elsewhere: Secondary | ICD-10-CM | POA: Diagnosis not present

## 2022-06-30 DIAGNOSIS — E039 Hypothyroidism, unspecified: Secondary | ICD-10-CM | POA: Diagnosis not present

## 2022-06-30 DIAGNOSIS — Z9181 History of falling: Secondary | ICD-10-CM | POA: Diagnosis not present

## 2022-06-30 DIAGNOSIS — K922 Gastrointestinal hemorrhage, unspecified: Secondary | ICD-10-CM | POA: Diagnosis not present

## 2022-06-30 DIAGNOSIS — I10 Essential (primary) hypertension: Secondary | ICD-10-CM | POA: Diagnosis not present

## 2022-06-30 DIAGNOSIS — Z7982 Long term (current) use of aspirin: Secondary | ICD-10-CM | POA: Diagnosis not present

## 2022-06-30 DIAGNOSIS — I69320 Aphasia following cerebral infarction: Secondary | ICD-10-CM | POA: Diagnosis not present

## 2022-06-30 DIAGNOSIS — Z853 Personal history of malignant neoplasm of breast: Secondary | ICD-10-CM | POA: Diagnosis not present

## 2022-07-01 DIAGNOSIS — I6381 Other cerebral infarction due to occlusion or stenosis of small artery: Secondary | ICD-10-CM | POA: Diagnosis not present

## 2022-07-01 DIAGNOSIS — R1310 Dysphagia, unspecified: Secondary | ICD-10-CM | POA: Diagnosis not present

## 2022-07-02 DIAGNOSIS — E785 Hyperlipidemia, unspecified: Secondary | ICD-10-CM | POA: Diagnosis not present

## 2022-07-02 DIAGNOSIS — D500 Iron deficiency anemia secondary to blood loss (chronic): Secondary | ICD-10-CM | POA: Diagnosis not present

## 2022-07-02 DIAGNOSIS — I10 Essential (primary) hypertension: Secondary | ICD-10-CM | POA: Diagnosis not present

## 2022-07-02 DIAGNOSIS — Z791 Long term (current) use of non-steroidal anti-inflammatories (NSAID): Secondary | ICD-10-CM | POA: Diagnosis not present

## 2022-07-02 DIAGNOSIS — N390 Urinary tract infection, site not specified: Secondary | ICD-10-CM | POA: Diagnosis not present

## 2022-07-02 DIAGNOSIS — I69320 Aphasia following cerebral infarction: Secondary | ICD-10-CM | POA: Diagnosis not present

## 2022-07-02 DIAGNOSIS — I69354 Hemiplegia and hemiparesis following cerebral infarction affecting left non-dominant side: Secondary | ICD-10-CM | POA: Diagnosis not present

## 2022-07-02 DIAGNOSIS — E039 Hypothyroidism, unspecified: Secondary | ICD-10-CM | POA: Diagnosis not present

## 2022-07-02 DIAGNOSIS — K219 Gastro-esophageal reflux disease without esophagitis: Secondary | ICD-10-CM | POA: Diagnosis not present

## 2022-07-02 DIAGNOSIS — Z7982 Long term (current) use of aspirin: Secondary | ICD-10-CM | POA: Diagnosis not present

## 2022-07-02 DIAGNOSIS — K922 Gastrointestinal hemorrhage, unspecified: Secondary | ICD-10-CM | POA: Diagnosis not present

## 2022-07-02 DIAGNOSIS — Z853 Personal history of malignant neoplasm of breast: Secondary | ICD-10-CM | POA: Diagnosis not present

## 2022-07-02 DIAGNOSIS — I4510 Unspecified right bundle-branch block: Secondary | ICD-10-CM | POA: Diagnosis not present

## 2022-07-02 DIAGNOSIS — B9561 Methicillin susceptible Staphylococcus aureus infection as the cause of diseases classified elsewhere: Secondary | ICD-10-CM | POA: Diagnosis not present

## 2022-07-02 DIAGNOSIS — Z9181 History of falling: Secondary | ICD-10-CM | POA: Diagnosis not present

## 2022-07-03 DIAGNOSIS — N390 Urinary tract infection, site not specified: Secondary | ICD-10-CM | POA: Diagnosis not present

## 2022-07-03 DIAGNOSIS — K922 Gastrointestinal hemorrhage, unspecified: Secondary | ICD-10-CM | POA: Diagnosis not present

## 2022-07-03 DIAGNOSIS — D500 Iron deficiency anemia secondary to blood loss (chronic): Secondary | ICD-10-CM | POA: Diagnosis not present

## 2022-07-07 ENCOUNTER — Emergency Department (HOSPITAL_BASED_OUTPATIENT_CLINIC_OR_DEPARTMENT_OTHER): Payer: Medicare Other

## 2022-07-07 ENCOUNTER — Encounter (HOSPITAL_BASED_OUTPATIENT_CLINIC_OR_DEPARTMENT_OTHER): Payer: Self-pay

## 2022-07-07 ENCOUNTER — Emergency Department (HOSPITAL_BASED_OUTPATIENT_CLINIC_OR_DEPARTMENT_OTHER)
Admission: EM | Admit: 2022-07-07 | Discharge: 2022-07-08 | Disposition: A | Discharge status: Home / Self Care | Payer: Medicare Other | Attending: Emergency Medicine | Admitting: Emergency Medicine

## 2022-07-07 DIAGNOSIS — S0990XA Unspecified injury of head, initial encounter: Secondary | ICD-10-CM | POA: Diagnosis not present

## 2022-07-07 DIAGNOSIS — I69354 Hemiplegia and hemiparesis following cerebral infarction affecting left non-dominant side: Secondary | ICD-10-CM | POA: Diagnosis not present

## 2022-07-07 DIAGNOSIS — I10 Essential (primary) hypertension: Secondary | ICD-10-CM | POA: Diagnosis not present

## 2022-07-07 DIAGNOSIS — Y92009 Unspecified place in unspecified non-institutional (private) residence as the place of occurrence of the external cause: Secondary | ICD-10-CM | POA: Diagnosis not present

## 2022-07-07 DIAGNOSIS — W01198A Fall on same level from slipping, tripping and stumbling with subsequent striking against other object, initial encounter: Secondary | ICD-10-CM | POA: Diagnosis not present

## 2022-07-07 DIAGNOSIS — K219 Gastro-esophageal reflux disease without esophagitis: Secondary | ICD-10-CM | POA: Diagnosis not present

## 2022-07-07 DIAGNOSIS — Y9389 Activity, other specified: Secondary | ICD-10-CM | POA: Diagnosis not present

## 2022-07-07 DIAGNOSIS — N390 Urinary tract infection, site not specified: Secondary | ICD-10-CM | POA: Diagnosis not present

## 2022-07-07 DIAGNOSIS — Z7982 Long term (current) use of aspirin: Secondary | ICD-10-CM | POA: Insufficient documentation

## 2022-07-07 DIAGNOSIS — S0083XA Contusion of other part of head, initial encounter: Secondary | ICD-10-CM | POA: Diagnosis not present

## 2022-07-07 DIAGNOSIS — S0993XA Unspecified injury of face, initial encounter: Secondary | ICD-10-CM | POA: Diagnosis not present

## 2022-07-07 DIAGNOSIS — Z791 Long term (current) use of non-steroidal anti-inflammatories (NSAID): Secondary | ICD-10-CM | POA: Diagnosis not present

## 2022-07-07 DIAGNOSIS — K922 Gastrointestinal hemorrhage, unspecified: Secondary | ICD-10-CM | POA: Diagnosis not present

## 2022-07-07 DIAGNOSIS — Z853 Personal history of malignant neoplasm of breast: Secondary | ICD-10-CM | POA: Diagnosis not present

## 2022-07-07 DIAGNOSIS — E785 Hyperlipidemia, unspecified: Secondary | ICD-10-CM | POA: Diagnosis not present

## 2022-07-07 DIAGNOSIS — I69320 Aphasia following cerebral infarction: Secondary | ICD-10-CM | POA: Diagnosis not present

## 2022-07-07 DIAGNOSIS — Z9181 History of falling: Secondary | ICD-10-CM | POA: Diagnosis not present

## 2022-07-07 DIAGNOSIS — I4510 Unspecified right bundle-branch block: Secondary | ICD-10-CM | POA: Diagnosis not present

## 2022-07-07 DIAGNOSIS — W19XXXA Unspecified fall, initial encounter: Secondary | ICD-10-CM

## 2022-07-07 DIAGNOSIS — D500 Iron deficiency anemia secondary to blood loss (chronic): Secondary | ICD-10-CM | POA: Diagnosis not present

## 2022-07-07 DIAGNOSIS — E039 Hypothyroidism, unspecified: Secondary | ICD-10-CM | POA: Diagnosis not present

## 2022-07-07 DIAGNOSIS — B9561 Methicillin susceptible Staphylococcus aureus infection as the cause of diseases classified elsewhere: Secondary | ICD-10-CM | POA: Diagnosis not present

## 2022-07-07 NOTE — ED Triage Notes (Signed)
Pt presents to the ED with daughter from home where she lives with her other daughter. She leaned forward today and lost her balance and fell forward. Does not take a blood thinner. No LOC. Pt A&Ox4 at time of triage. VSS. Pupils round, reactive and equal. Pt complaining of nose pain.

## 2022-07-08 NOTE — ED Provider Notes (Signed)
[place] EMERGENCY DEPT  Provider Note  CSN: [id number] Arrival date & time: 07/07/22 1959  History Chief Complaint  Patient presents with   Lytle Michaels    Jessica [name] is a 81 y.o. female brought by her daughters after a fall at home. She was leaning over to pick up a piece of paper and lost her balance hitting her nose and L cheek on the ground. Unsure if she also hit her head. Did not have LOC. Not on a blood thinner. No other reports of injuries.    Home Medications Prior to Admission medications   Medication Sig Start Date End Date Taking? Authorizing Provider  acetaminophen (TYLENOL) 325 MG tablet Take 2 tablets (650 mg total) by mouth every 6 (six) hours as needed for mild pain (or Fever >/= 101). 03/30/22   Angiulli, Lavon Paganini, PA-C  amLODipine (NORVASC) 10 MG tablet Take 1 tablet (10 mg total) by mouth daily. 03/30/22   Angiulli, Lavon Paganini, PA-C  ascorbic acid (VITAMIN C) 500 MG tablet Take 1 tablet (500 mg total) by mouth daily. 06/05/22   Allie Bossier, MD  aspirin 81 MG chewable tablet Chew 1 tablet (81 mg total) by mouth daily. 03/24/22   Danford, Suann Larry, MD  atorvastatin (LIPITOR) 80 MG tablet Take 1 tablet (80 mg total) by mouth at bedtime. Patient taking differently: Take 80 mg by mouth daily. 03/30/22   Angiulli, Lavon Paganini, PA-C  buPROPion (WELLBUTRIN XL) 150 MG 24 hr tablet Take 1 tablet (150 mg total) by mouth every morning. Patient taking differently: Take 150 mg by mouth daily. 03/30/22   Angiulli, Lavon Paganini, PA-C  cephALEXin (KEFLEX) 250 MG capsule Take 250 mg by mouth daily. 06/17/22   [provider]  cholecalciferol (VITAMIN D3) 25 MCG (1000 UNIT) tablet Take 1 tablet (1,000 Units total) by mouth daily. 03/30/22   Angiulli, Lavon Paganini, PA-C  diclofenac Sodium (VOLTAREN) 1 % GEL Apply 2 g topically 4 (four) times daily as needed (shoulder pain). Patient not taking: Reported on 06/24/2022 03/30/22   Angiulli, Lavon Paganini, PA-C  docusate sodium  (COLACE) 100 MG capsule Take 1 capsule (100 mg total) by mouth 2 (two) times daily. 06/13/22 06/13/23  Barb Merino, MD  ferrous sulfate 325 (65 FE) MG tablet Take 1 tablet (325 mg total) by mouth daily with breakfast. 06/05/22   Allie Bossier, MD  folic acid (FOLVITE) 1 MG tablet Take 2 tablets (2 mg total) by mouth daily. 06/05/22   Allie Bossier, MD  gabapentin (NEURONTIN) 100 MG capsule Take 100 mg by mouth at bedtime.    [provider]  hydrocortisone (ANUSOL-HC) 2.5 % rectal cream Apply 1 Application topically 2 (two) times daily. 06/17/22   [provider]  lactulose (CHRONULAC) 10 GM/15ML solution Take 45 mLs (30 g total) by mouth 2 (two) times daily as needed for mild constipation or moderate constipation. 06/12/22   Barb Merino, MD  levothyroxine (SYNTHROID) 75 MCG tablet Take 1 tablet (75 mcg total) by mouth daily. 03/30/22   Angiulli, Lavon Paganini, PA-C  Multiple Vitamins-Minerals (OCUVITE ADULT FORMULA PO) Take 1 tablet by mouth daily.    [provider]  pantoprazole (PROTONIX) 40 MG tablet Take 1 tablet (40 mg total) by mouth 2 (two) times daily. 06/05/22   Allie Bossier, MD  potassium chloride (KLOR-CON M) 10 MEQ tablet Take 1 tablet (10 mEq total) by mouth 3 (three) times daily. 03/30/22   Kirsteins, Luanna Salk, MD  venlafaxine XR (  EFFEXOR-XR) 75 MG 24 hr capsule Take 3 capsules (225 mg total) by mouth daily. 03/30/22   Angiulli, Lavon Paganini, PA-C     Allergies    Advil [ibuprofen], Codeine, Penicillins, Sulfamethoxazole-trimethoprim, Crestor [rosuvastatin calcium], Band-aid plus antibiotic [bacitracin-polymyxin b], Benazepril, and Other   Review of Systems   Review of Systems Please see HPI for pertinent positives and negatives  Physical Exam BP (!) 144/66 (BP Location: Right Arm)   Pulse 69   Temp 98 F (36.7 C)   Resp 14   Ht '5\' 3"'$  (1.6 m)   Wt 77.1 kg   SpO2 96%   BMI 30.11 kg/m   Physical Exam Vitals and nursing note reviewed.   Constitutional:      Appearance: Normal appearance.  HENT:     Head: Normocephalic.     Comments: Contusion L cheek    Nose: Nose normal.     Comments: No bleeding or hematoma    Mouth/Throat:     Mouth: Mucous membranes are moist.  Eyes:     Extraocular Movements: Extraocular movements intact.     Conjunctiva/sclera: Conjunctivae normal.  Cardiovascular:     Rate and Rhythm: Normal rate.  Pulmonary:     Effort: Pulmonary effort is normal.     Breath sounds: Normal breath sounds.  Abdominal:     General: Abdomen is flat.     Palpations: Abdomen is soft.     Tenderness: There is no abdominal tenderness.  Musculoskeletal:        General: No swelling. Normal range of motion.     Cervical back: Neck supple. No tenderness (no midline tenderness).  Skin:    General: Skin is warm and dry.  Neurological:     General: No focal deficit present.     Mental Status: She is alert.  Psychiatric:        Mood and Affect: Mood normal.     ED Results / Procedures / Treatments   EKG None  Procedures Procedures  Medications Ordered in the ED Medications - No data to display  Initial Impression and Plan  Patient here with mechanical fall, imaging obtained from triage. I personally viewed the images from radiology studies and agree with radiologist interpretation: CT neg for acute injury. Exam is otherwise benign. She will be discharged with her daughters. Head injury precautions. PCP follow up. RTED for any other concerns.    ED Course       MDM Rules/Calculators/A&P Medical Decision Making Problems Addressed: Fall, initial encounter: acute illness or injury Head injury: acute illness or injury  Amount and/or Complexity of Data Reviewed Independent Historian:     Details: daughters Radiology: ordered and independent interpretation performed. Decision-making details documented in ED Course.    Final Clinical Impression(s) / ED Diagnoses Final diagnoses:  Fall, initial  encounter  Head injury    Rx / DC Orders ED Discharge Orders     None        Truddie Hidden, MD 07/08/22 [phone]

## 2022-07-09 DIAGNOSIS — I69354 Hemiplegia and hemiparesis following cerebral infarction affecting left non-dominant side: Secondary | ICD-10-CM | POA: Diagnosis not present

## 2022-07-09 DIAGNOSIS — N390 Urinary tract infection, site not specified: Secondary | ICD-10-CM | POA: Diagnosis not present

## 2022-07-09 DIAGNOSIS — Z853 Personal history of malignant neoplasm of breast: Secondary | ICD-10-CM | POA: Diagnosis not present

## 2022-07-09 DIAGNOSIS — D500 Iron deficiency anemia secondary to blood loss (chronic): Secondary | ICD-10-CM | POA: Diagnosis not present

## 2022-07-09 DIAGNOSIS — E039 Hypothyroidism, unspecified: Secondary | ICD-10-CM | POA: Diagnosis not present

## 2022-07-09 DIAGNOSIS — K219 Gastro-esophageal reflux disease without esophagitis: Secondary | ICD-10-CM | POA: Diagnosis not present

## 2022-07-09 DIAGNOSIS — B9561 Methicillin susceptible Staphylococcus aureus infection as the cause of diseases classified elsewhere: Secondary | ICD-10-CM | POA: Diagnosis not present

## 2022-07-09 DIAGNOSIS — I10 Essential (primary) hypertension: Secondary | ICD-10-CM | POA: Diagnosis not present

## 2022-07-09 DIAGNOSIS — Z7982 Long term (current) use of aspirin: Secondary | ICD-10-CM | POA: Diagnosis not present

## 2022-07-09 DIAGNOSIS — K922 Gastrointestinal hemorrhage, unspecified: Secondary | ICD-10-CM | POA: Diagnosis not present

## 2022-07-09 DIAGNOSIS — I4510 Unspecified right bundle-branch block: Secondary | ICD-10-CM | POA: Diagnosis not present

## 2022-07-09 DIAGNOSIS — Z9181 History of falling: Secondary | ICD-10-CM | POA: Diagnosis not present

## 2022-07-09 DIAGNOSIS — Z791 Long term (current) use of non-steroidal anti-inflammatories (NSAID): Secondary | ICD-10-CM | POA: Diagnosis not present

## 2022-07-09 DIAGNOSIS — E785 Hyperlipidemia, unspecified: Secondary | ICD-10-CM | POA: Diagnosis not present

## 2022-07-09 DIAGNOSIS — I69320 Aphasia following cerebral infarction: Secondary | ICD-10-CM | POA: Diagnosis not present

## 2022-07-10 DIAGNOSIS — Z8719 Personal history of other diseases of the digestive system: Secondary | ICD-10-CM | POA: Diagnosis not present

## 2022-07-10 DIAGNOSIS — D508 Other iron deficiency anemias: Secondary | ICD-10-CM | POA: Diagnosis not present

## 2022-07-10 DIAGNOSIS — N390 Urinary tract infection, site not specified: Secondary | ICD-10-CM | POA: Diagnosis not present

## 2022-07-10 DIAGNOSIS — K5900 Constipation, unspecified: Secondary | ICD-10-CM | POA: Diagnosis not present

## 2022-07-10 DIAGNOSIS — M25512 Pain in left shoulder: Secondary | ICD-10-CM | POA: Diagnosis not present

## 2022-07-10 DIAGNOSIS — I10 Essential (primary) hypertension: Secondary | ICD-10-CM | POA: Diagnosis not present

## 2022-07-10 DIAGNOSIS — Z8673 Personal history of transient ischemic attack (TIA), and cerebral infarction without residual deficits: Secondary | ICD-10-CM | POA: Diagnosis not present

## 2022-07-10 DIAGNOSIS — S0990XD Unspecified injury of head, subsequent encounter: Secondary | ICD-10-CM | POA: Diagnosis not present

## 2022-07-10 DIAGNOSIS — Z09 Encounter for follow-up examination after completed treatment for conditions other than malignant neoplasm: Secondary | ICD-10-CM | POA: Diagnosis not present

## 2022-07-13 DIAGNOSIS — I69354 Hemiplegia and hemiparesis following cerebral infarction affecting left non-dominant side: Secondary | ICD-10-CM | POA: Diagnosis not present

## 2022-07-13 DIAGNOSIS — K219 Gastro-esophageal reflux disease without esophagitis: Secondary | ICD-10-CM | POA: Diagnosis not present

## 2022-07-13 DIAGNOSIS — Z791 Long term (current) use of non-steroidal anti-inflammatories (NSAID): Secondary | ICD-10-CM | POA: Diagnosis not present

## 2022-07-13 DIAGNOSIS — E785 Hyperlipidemia, unspecified: Secondary | ICD-10-CM | POA: Diagnosis not present

## 2022-07-13 DIAGNOSIS — I4510 Unspecified right bundle-branch block: Secondary | ICD-10-CM | POA: Diagnosis not present

## 2022-07-13 DIAGNOSIS — I10 Essential (primary) hypertension: Secondary | ICD-10-CM | POA: Diagnosis not present

## 2022-07-13 DIAGNOSIS — B9561 Methicillin susceptible Staphylococcus aureus infection as the cause of diseases classified elsewhere: Secondary | ICD-10-CM | POA: Diagnosis not present

## 2022-07-13 DIAGNOSIS — Z9181 History of falling: Secondary | ICD-10-CM | POA: Diagnosis not present

## 2022-07-13 DIAGNOSIS — Z853 Personal history of malignant neoplasm of breast: Secondary | ICD-10-CM | POA: Diagnosis not present

## 2022-07-13 DIAGNOSIS — D500 Iron deficiency anemia secondary to blood loss (chronic): Secondary | ICD-10-CM | POA: Diagnosis not present

## 2022-07-13 DIAGNOSIS — I69320 Aphasia following cerebral infarction: Secondary | ICD-10-CM | POA: Diagnosis not present

## 2022-07-13 DIAGNOSIS — K922 Gastrointestinal hemorrhage, unspecified: Secondary | ICD-10-CM | POA: Diagnosis not present

## 2022-07-13 DIAGNOSIS — E039 Hypothyroidism, unspecified: Secondary | ICD-10-CM | POA: Diagnosis not present

## 2022-07-13 DIAGNOSIS — Z7982 Long term (current) use of aspirin: Secondary | ICD-10-CM | POA: Diagnosis not present

## 2022-07-13 DIAGNOSIS — N390 Urinary tract infection, site not specified: Secondary | ICD-10-CM | POA: Diagnosis not present

## 2022-07-14 DIAGNOSIS — K219 Gastro-esophageal reflux disease without esophagitis: Secondary | ICD-10-CM | POA: Diagnosis not present

## 2022-07-14 DIAGNOSIS — Z9181 History of falling: Secondary | ICD-10-CM | POA: Diagnosis not present

## 2022-07-14 DIAGNOSIS — Z853 Personal history of malignant neoplasm of breast: Secondary | ICD-10-CM | POA: Diagnosis not present

## 2022-07-14 DIAGNOSIS — D500 Iron deficiency anemia secondary to blood loss (chronic): Secondary | ICD-10-CM | POA: Diagnosis not present

## 2022-07-14 DIAGNOSIS — E039 Hypothyroidism, unspecified: Secondary | ICD-10-CM | POA: Diagnosis not present

## 2022-07-14 DIAGNOSIS — N390 Urinary tract infection, site not specified: Secondary | ICD-10-CM | POA: Diagnosis not present

## 2022-07-14 DIAGNOSIS — I10 Essential (primary) hypertension: Secondary | ICD-10-CM | POA: Diagnosis not present

## 2022-07-14 DIAGNOSIS — I69354 Hemiplegia and hemiparesis following cerebral infarction affecting left non-dominant side: Secondary | ICD-10-CM | POA: Diagnosis not present

## 2022-07-14 DIAGNOSIS — Z791 Long term (current) use of non-steroidal anti-inflammatories (NSAID): Secondary | ICD-10-CM | POA: Diagnosis not present

## 2022-07-14 DIAGNOSIS — B9561 Methicillin susceptible Staphylococcus aureus infection as the cause of diseases classified elsewhere: Secondary | ICD-10-CM | POA: Diagnosis not present

## 2022-07-14 DIAGNOSIS — I4510 Unspecified right bundle-branch block: Secondary | ICD-10-CM | POA: Diagnosis not present

## 2022-07-14 DIAGNOSIS — I69320 Aphasia following cerebral infarction: Secondary | ICD-10-CM | POA: Diagnosis not present

## 2022-07-14 DIAGNOSIS — Z7982 Long term (current) use of aspirin: Secondary | ICD-10-CM | POA: Diagnosis not present

## 2022-07-14 DIAGNOSIS — K922 Gastrointestinal hemorrhage, unspecified: Secondary | ICD-10-CM | POA: Diagnosis not present

## 2022-07-14 DIAGNOSIS — E785 Hyperlipidemia, unspecified: Secondary | ICD-10-CM | POA: Diagnosis not present

## 2022-07-16 DIAGNOSIS — E785 Hyperlipidemia, unspecified: Secondary | ICD-10-CM | POA: Diagnosis not present

## 2022-07-16 DIAGNOSIS — I4510 Unspecified right bundle-branch block: Secondary | ICD-10-CM | POA: Diagnosis not present

## 2022-07-16 DIAGNOSIS — K219 Gastro-esophageal reflux disease without esophagitis: Secondary | ICD-10-CM | POA: Diagnosis not present

## 2022-07-16 DIAGNOSIS — E039 Hypothyroidism, unspecified: Secondary | ICD-10-CM | POA: Diagnosis not present

## 2022-07-16 DIAGNOSIS — D500 Iron deficiency anemia secondary to blood loss (chronic): Secondary | ICD-10-CM | POA: Diagnosis not present

## 2022-07-16 DIAGNOSIS — N390 Urinary tract infection, site not specified: Secondary | ICD-10-CM | POA: Diagnosis not present

## 2022-07-16 DIAGNOSIS — Z7982 Long term (current) use of aspirin: Secondary | ICD-10-CM | POA: Diagnosis not present

## 2022-07-16 DIAGNOSIS — Z9181 History of falling: Secondary | ICD-10-CM | POA: Diagnosis not present

## 2022-07-16 DIAGNOSIS — B9561 Methicillin susceptible Staphylococcus aureus infection as the cause of diseases classified elsewhere: Secondary | ICD-10-CM | POA: Diagnosis not present

## 2022-07-16 DIAGNOSIS — Z791 Long term (current) use of non-steroidal anti-inflammatories (NSAID): Secondary | ICD-10-CM | POA: Diagnosis not present

## 2022-07-16 DIAGNOSIS — I10 Essential (primary) hypertension: Secondary | ICD-10-CM | POA: Diagnosis not present

## 2022-07-16 DIAGNOSIS — I69354 Hemiplegia and hemiparesis following cerebral infarction affecting left non-dominant side: Secondary | ICD-10-CM | POA: Diagnosis not present

## 2022-07-16 DIAGNOSIS — K922 Gastrointestinal hemorrhage, unspecified: Secondary | ICD-10-CM | POA: Diagnosis not present

## 2022-07-16 DIAGNOSIS — Z853 Personal history of malignant neoplasm of breast: Secondary | ICD-10-CM | POA: Diagnosis not present

## 2022-07-16 DIAGNOSIS — I69320 Aphasia following cerebral infarction: Secondary | ICD-10-CM | POA: Diagnosis not present

## 2022-07-22 DIAGNOSIS — B9561 Methicillin susceptible Staphylococcus aureus infection as the cause of diseases classified elsewhere: Secondary | ICD-10-CM | POA: Diagnosis not present

## 2022-07-22 DIAGNOSIS — K922 Gastrointestinal hemorrhage, unspecified: Secondary | ICD-10-CM | POA: Diagnosis not present

## 2022-07-22 DIAGNOSIS — Z791 Long term (current) use of non-steroidal anti-inflammatories (NSAID): Secondary | ICD-10-CM | POA: Diagnosis not present

## 2022-07-22 DIAGNOSIS — Z7982 Long term (current) use of aspirin: Secondary | ICD-10-CM | POA: Diagnosis not present

## 2022-07-22 DIAGNOSIS — I69320 Aphasia following cerebral infarction: Secondary | ICD-10-CM | POA: Diagnosis not present

## 2022-07-22 DIAGNOSIS — I69354 Hemiplegia and hemiparesis following cerebral infarction affecting left non-dominant side: Secondary | ICD-10-CM | POA: Diagnosis not present

## 2022-07-22 DIAGNOSIS — Z9181 History of falling: Secondary | ICD-10-CM | POA: Diagnosis not present

## 2022-07-22 DIAGNOSIS — Z853 Personal history of malignant neoplasm of breast: Secondary | ICD-10-CM | POA: Diagnosis not present

## 2022-07-22 DIAGNOSIS — E039 Hypothyroidism, unspecified: Secondary | ICD-10-CM | POA: Diagnosis not present

## 2022-07-22 DIAGNOSIS — I4510 Unspecified right bundle-branch block: Secondary | ICD-10-CM | POA: Diagnosis not present

## 2022-07-22 DIAGNOSIS — D500 Iron deficiency anemia secondary to blood loss (chronic): Secondary | ICD-10-CM | POA: Diagnosis not present

## 2022-07-22 DIAGNOSIS — I10 Essential (primary) hypertension: Secondary | ICD-10-CM | POA: Diagnosis not present

## 2022-07-22 DIAGNOSIS — K219 Gastro-esophageal reflux disease without esophagitis: Secondary | ICD-10-CM | POA: Diagnosis not present

## 2022-07-22 DIAGNOSIS — N390 Urinary tract infection, site not specified: Secondary | ICD-10-CM | POA: Diagnosis not present

## 2022-07-22 DIAGNOSIS — E785 Hyperlipidemia, unspecified: Secondary | ICD-10-CM | POA: Diagnosis not present

## 2022-07-28 DIAGNOSIS — E785 Hyperlipidemia, unspecified: Secondary | ICD-10-CM | POA: Diagnosis not present

## 2022-07-28 DIAGNOSIS — Z853 Personal history of malignant neoplasm of breast: Secondary | ICD-10-CM | POA: Diagnosis not present

## 2022-07-28 DIAGNOSIS — I69354 Hemiplegia and hemiparesis following cerebral infarction affecting left non-dominant side: Secondary | ICD-10-CM | POA: Diagnosis not present

## 2022-07-28 DIAGNOSIS — Z791 Long term (current) use of non-steroidal anti-inflammatories (NSAID): Secondary | ICD-10-CM | POA: Diagnosis not present

## 2022-07-28 DIAGNOSIS — I4510 Unspecified right bundle-branch block: Secondary | ICD-10-CM | POA: Diagnosis not present

## 2022-07-28 DIAGNOSIS — I10 Essential (primary) hypertension: Secondary | ICD-10-CM | POA: Diagnosis not present

## 2022-07-28 DIAGNOSIS — I69320 Aphasia following cerebral infarction: Secondary | ICD-10-CM | POA: Diagnosis not present

## 2022-07-28 DIAGNOSIS — Z9181 History of falling: Secondary | ICD-10-CM | POA: Diagnosis not present

## 2022-07-28 DIAGNOSIS — D500 Iron deficiency anemia secondary to blood loss (chronic): Secondary | ICD-10-CM | POA: Diagnosis not present

## 2022-07-28 DIAGNOSIS — B9561 Methicillin susceptible Staphylococcus aureus infection as the cause of diseases classified elsewhere: Secondary | ICD-10-CM | POA: Diagnosis not present

## 2022-07-28 DIAGNOSIS — N390 Urinary tract infection, site not specified: Secondary | ICD-10-CM | POA: Diagnosis not present

## 2022-07-28 DIAGNOSIS — K219 Gastro-esophageal reflux disease without esophagitis: Secondary | ICD-10-CM | POA: Diagnosis not present

## 2022-07-28 DIAGNOSIS — Z7982 Long term (current) use of aspirin: Secondary | ICD-10-CM | POA: Diagnosis not present

## 2022-07-28 DIAGNOSIS — E039 Hypothyroidism, unspecified: Secondary | ICD-10-CM | POA: Diagnosis not present

## 2022-07-28 DIAGNOSIS — K922 Gastrointestinal hemorrhage, unspecified: Secondary | ICD-10-CM | POA: Diagnosis not present

## 2022-07-29 ENCOUNTER — Inpatient Hospital Stay: Payer: Medicare Other

## 2022-07-30 DIAGNOSIS — B9561 Methicillin susceptible Staphylococcus aureus infection as the cause of diseases classified elsewhere: Secondary | ICD-10-CM | POA: Diagnosis not present

## 2022-07-30 DIAGNOSIS — D500 Iron deficiency anemia secondary to blood loss (chronic): Secondary | ICD-10-CM | POA: Diagnosis not present

## 2022-07-30 DIAGNOSIS — Z791 Long term (current) use of non-steroidal anti-inflammatories (NSAID): Secondary | ICD-10-CM | POA: Diagnosis not present

## 2022-07-30 DIAGNOSIS — I69320 Aphasia following cerebral infarction: Secondary | ICD-10-CM | POA: Diagnosis not present

## 2022-07-30 DIAGNOSIS — I10 Essential (primary) hypertension: Secondary | ICD-10-CM | POA: Diagnosis not present

## 2022-07-30 DIAGNOSIS — E785 Hyperlipidemia, unspecified: Secondary | ICD-10-CM | POA: Diagnosis not present

## 2022-07-30 DIAGNOSIS — E039 Hypothyroidism, unspecified: Secondary | ICD-10-CM | POA: Diagnosis not present

## 2022-07-30 DIAGNOSIS — Z853 Personal history of malignant neoplasm of breast: Secondary | ICD-10-CM | POA: Diagnosis not present

## 2022-07-30 DIAGNOSIS — I69354 Hemiplegia and hemiparesis following cerebral infarction affecting left non-dominant side: Secondary | ICD-10-CM | POA: Diagnosis not present

## 2022-07-30 DIAGNOSIS — K922 Gastrointestinal hemorrhage, unspecified: Secondary | ICD-10-CM | POA: Diagnosis not present

## 2022-07-30 DIAGNOSIS — K219 Gastro-esophageal reflux disease without esophagitis: Secondary | ICD-10-CM | POA: Diagnosis not present

## 2022-07-30 DIAGNOSIS — Z7982 Long term (current) use of aspirin: Secondary | ICD-10-CM | POA: Diagnosis not present

## 2022-07-30 DIAGNOSIS — N390 Urinary tract infection, site not specified: Secondary | ICD-10-CM | POA: Diagnosis not present

## 2022-07-30 DIAGNOSIS — Z9181 History of falling: Secondary | ICD-10-CM | POA: Diagnosis not present

## 2022-07-30 DIAGNOSIS — I4510 Unspecified right bundle-branch block: Secondary | ICD-10-CM | POA: Diagnosis not present

## 2022-08-01 DIAGNOSIS — R1310 Dysphagia, unspecified: Secondary | ICD-10-CM | POA: Diagnosis not present

## 2022-08-01 DIAGNOSIS — I6381 Other cerebral infarction due to occlusion or stenosis of small artery: Secondary | ICD-10-CM | POA: Diagnosis not present

## 2022-08-06 ENCOUNTER — Inpatient Hospital Stay: Payer: Medicare Other

## 2022-08-10 ENCOUNTER — Encounter: Payer: Medicare Other | Admitting: Physical Medicine and Rehabilitation

## 2022-08-17 DIAGNOSIS — Z8673 Personal history of transient ischemic attack (TIA), and cerebral infarction without residual deficits: Secondary | ICD-10-CM | POA: Diagnosis not present

## 2022-08-17 DIAGNOSIS — Z8719 Personal history of other diseases of the digestive system: Secondary | ICD-10-CM | POA: Diagnosis not present

## 2022-08-17 DIAGNOSIS — N390 Urinary tract infection, site not specified: Secondary | ICD-10-CM | POA: Diagnosis not present

## 2022-08-17 DIAGNOSIS — D508 Other iron deficiency anemias: Secondary | ICD-10-CM | POA: Diagnosis not present

## 2022-08-17 DIAGNOSIS — I10 Essential (primary) hypertension: Secondary | ICD-10-CM | POA: Diagnosis not present

## 2022-08-18 ENCOUNTER — Inpatient Hospital Stay: Payer: Medicare Other | Attending: Hematology & Oncology

## 2022-08-18 VITALS — BP 147/70 | HR 69 | Temp 98.4°F | Resp 18

## 2022-08-18 DIAGNOSIS — Z171 Estrogen receptor negative status [ER-]: Secondary | ICD-10-CM | POA: Diagnosis not present

## 2022-08-18 DIAGNOSIS — C50912 Malignant neoplasm of unspecified site of left female breast: Secondary | ICD-10-CM | POA: Insufficient documentation

## 2022-08-18 DIAGNOSIS — C50212 Malignant neoplasm of upper-inner quadrant of left female breast: Secondary | ICD-10-CM

## 2022-08-18 MED ORDER — SODIUM CHLORIDE 0.9% FLUSH
10.0000 mL | INTRAVENOUS | Status: DC | PRN
  Administered 2022-08-18: 10 mL

## 2022-08-18 MED ORDER — HEPARIN SOD (PORK) LOCK FLUSH 100 UNIT/ML IV SOLN
500.0000 [IU] | Freq: Once | INTRAVENOUS | Status: AC | PRN
  Administered 2022-08-18: 500 [IU]

## 2022-08-18 NOTE — Patient Instructions (Signed)

## 2022-08-31 ENCOUNTER — Ambulatory Visit: Payer: Medicare Other | Admitting: Internal Medicine

## 2022-08-31 DIAGNOSIS — I6381 Other cerebral infarction due to occlusion or stenosis of small artery: Secondary | ICD-10-CM | POA: Diagnosis not present

## 2022-08-31 DIAGNOSIS — R1310 Dysphagia, unspecified: Secondary | ICD-10-CM | POA: Diagnosis not present

## 2022-09-01 DIAGNOSIS — N3020 Other chronic cystitis without hematuria: Secondary | ICD-10-CM | POA: Diagnosis not present

## 2022-09-08 ENCOUNTER — Telehealth: Payer: Self-pay | Admitting: *Deleted

## 2022-09-08 NOTE — Telephone Encounter (Signed)
Received voice mail message from daughter [name] stating,"mom saw Dr. Gloriann Loan at [facility] and he has prescribed estrogen cream for her UTI's. He would like for Dr. Marin Olp to please confirm that is is OK to use. My return number is [phone].

## 2022-09-09 NOTE — Telephone Encounter (Signed)
This nurse called [name] back and told her that it was OK to use the estrogen cream per Dr. Marin Olp. She verbalized understanding.

## 2022-09-21 DIAGNOSIS — H524 Presbyopia: Secondary | ICD-10-CM | POA: Diagnosis not present

## 2022-09-21 DIAGNOSIS — H35033 Hypertensive retinopathy, bilateral: Secondary | ICD-10-CM | POA: Diagnosis not present

## 2022-09-21 DIAGNOSIS — H04123 Dry eye syndrome of bilateral lacrimal glands: Secondary | ICD-10-CM | POA: Diagnosis not present

## 2022-09-21 DIAGNOSIS — H35371 Puckering of macula, right eye: Secondary | ICD-10-CM | POA: Diagnosis not present

## 2022-09-21 DIAGNOSIS — H35361 Drusen (degenerative) of macula, right eye: Secondary | ICD-10-CM | POA: Diagnosis not present

## 2022-09-28 ENCOUNTER — Inpatient Hospital Stay: Payer: Medicare Other | Attending: Hematology & Oncology

## 2022-09-30 DIAGNOSIS — J069 Acute upper respiratory infection, unspecified: Secondary | ICD-10-CM | POA: Diagnosis not present

## 2022-09-30 DIAGNOSIS — R051 Acute cough: Secondary | ICD-10-CM | POA: Diagnosis not present

## 2022-09-30 NOTE — Telephone Encounter (Signed)
error 

## 2022-10-01 DIAGNOSIS — I6381 Other cerebral infarction due to occlusion or stenosis of small artery: Secondary | ICD-10-CM | POA: Diagnosis not present

## 2022-10-01 DIAGNOSIS — R1310 Dysphagia, unspecified: Secondary | ICD-10-CM | POA: Diagnosis not present

## 2022-10-05 ENCOUNTER — Encounter
Payer: Medicare Other | Attending: Physical Medicine and Rehabilitation | Admitting: Physical Medicine and Rehabilitation

## 2022-10-05 ENCOUNTER — Encounter: Payer: Self-pay | Admitting: Physical Medicine and Rehabilitation

## 2022-10-05 VITALS — BP 132/77 | HR 76 | Ht 63.0 in | Wt 171.0 lb

## 2022-10-05 DIAGNOSIS — I6350 Cerebral infarction due to unspecified occlusion or stenosis of unspecified cerebral artery: Secondary | ICD-10-CM | POA: Insufficient documentation

## 2022-10-05 DIAGNOSIS — E876 Hypokalemia: Secondary | ICD-10-CM | POA: Insufficient documentation

## 2022-10-05 MED ORDER — MODAFINIL 100 MG PO TABS: 100.0000 mg | ORAL_TABLET | Freq: Every day | ORAL | 1 refills | Status: DC

## 2022-10-05 MED ORDER — VITAMIN D (ERGOCALCIFEROL) 1.25 MG (50000 UNIT) PO CAPS: 50000.0000 [IU] | ORAL_CAPSULE | ORAL | 0 refills | Status: DC

## 2022-10-05 NOTE — Patient Instructions (Addendum)
HTN: -BP is ___today.  -Advised checking BP daily at home and logging results to bring into follow-up appointment with PCP and myself. -Reviewed BP meds today.  -Advised regarding healthy foods that can help lower blood pressure and provided with a list: 1) citrus foods- high in vitamins and minerals 2) salmon and other fatty fish - reduces inflammation and oxylipins 3) swiss chard (leafy green)- high level of nitrates 4) pumpkin seeds- one of the best natural sources of magnesium 5) Beans and lentils- high in fiber, magnesium, and potassium 6) Berries- high in flavonoids 7) Amaranth (whole grain, can be cooked similarly to rice and oats)- high in magnesium and fiber 8) Pistachios- even more effective at reducing BP than other nuts 9) Carrots- high in phenolic compounds that relax blood vessels and reduce inflammation 10) Celery- contain phthalides that relax tissues of arterial walls 11) Tomatoes- can also improve cholesterol and reduce risk of heart disease 12) Broccoli- good source of magnesium, calcium, and potassium 13) Greek yogurt: high in potassium and calcium 14) Herbs and spices: Celery seed, cilantro, saffron, lemongrass, black cumin, ginseng, cinnamon, cardamom, sweet basil, and ginger 15) Chia and flax seeds- also help to lower cholesterol and blood sugar 16) Beets- high levels of nitrates that relax blood vessels  17) spinach and bananas- high in potassium  -Provided lise of supplements that can help with hypertension:  1) magnesium: one high quality brand is Bioptemizers since it contains all 7 types of magnesium, otherwise over the counter magnesium gluconate '400mg'$  is a good option 2) B vitamins 3) vitamin D 4) potassium 5) CoQ10 6) L-arginine 7) Vitamin C 8) Beetroot -Educated that goal BP is 120/80. -Made goal to incorporate some of the above foods into diet.     Constipation:  -Provided list of following foods that help with constipation and highlighted a  few: 1) prunes- contain high amounts of fiber.  2) apples- has a form of dietary fiber called pectin that accelerates stool movement and increases beneficial gut bacteria 3) pears- in addition to fiber, also high in fructose and sorbitol which have laxative effect 4) figs- contain an enzyme ficin which helps to speed colonic transit 5) kiwis- contain an enzyme actinidin that improves gut motility and reduces constipation 6) oranges- rich in pectin (like apples) 7) grapefruits- contain a flavanol naringenin which has a laxative effect 8) vegetables- rich in fiber and also great sources of folate, vitamin C, and K 9) artichoke- high in inulin, prebiotic great for the microbiome 10) chicory- increases stool frequency and softness (can be added to coffee) 11) rhubarb- laxative effect 12) sweet potato- high fiber 13) beans, peas, and lentils- contain both soluble and insoluble fiber 14) chia seeds- improves intestinal health and gut flora 15) flaxseeds- laxative effect 16) whole grain rye bread- high in fiber 17) oat bran- high in soluble and insoluble fiber 18) kefir- softens stools -recommended to try at least one of these foods every day.  -drink 6-8 glasses of water per day -walk regularly, especially after meals.

## 2022-10-05 NOTE — Progress Notes (Signed)
Subjective:    Patient ID: Jessica [name], female    DOB: [birth year], 81 y.o.   MRN: [id number]  HPI   Pain Inventory Average Pain 0 Pain Right Now 0 My pain is  no pain  LOCATION OF PAIN  no pain  BOWEL Number of stools per week: 3-4 Oral laxative use Yes  Type of laxative colace  BLADDER Normal  Mobility use a walker ability to climb steps?  no do you drive?  no  Function retired I need assistance with the following:  dressing, bathing, meal prep, household duties, and shopping Do you have any goals in this area?  yes  Neuro/Psych tremor confusion depression  Prior Studies x-rays, maybe a CT  Physicians involved in your care Any changes since last visit?  yes  Dr. Gloriann Loan at [facility]    Family History  Problem Relation Age of Onset  . Heart disease Mother        Angina  . Heart disease Father   . Cervical cancer Maternal Aunt   . Heart attack Brother        Died of MI in his mid 47s  . Prostate cancer Brother        dx in his early 29s  . Breast cancer Paternal Aunt        dx over 13  . Bone cancer Cousin        maternal first cousin dx in her 63s-60s  . Lung cancer Cousin        paternal first cousin  . Colon cancer Neg Hx   . Pancreatic cancer Neg Hx   . Stomach cancer Neg Hx   . Esophageal cancer Neg Hx   . Rectal cancer Neg Hx    Social History   Socioeconomic History  . Marital status: Married    Spouse name: Not on file  . Number of children: 3  . Years of education: Not on file  . Highest education level: Not on file  Occupational History  . Occupation: retired   Tobacco Use  . Smoking status: Never    Passive exposure: Past  . Smokeless tobacco: Never  Vaping Use  . Vaping Use: Never used  Substance and Sexual Activity  . Alcohol use: No  . Drug use: No  . Sexual activity: Not on file  Other Topics Concern  . Not on file  Social History Narrative   Married mother of 3 with one grandchild. Never smoked. Does  not drink alcohol.   Walks 3-4 days a week for roughly 20-30 minutes.   Social Determinants of Health   Financial Resource Strain: Not on file  Food Insecurity: Not on file  Transportation Needs: Not on file  Physical Activity: Not on file  Stress: Not on file  Social Connections: Not on file   Past Surgical History:  Procedure Laterality Date  . ABDOMINAL HYSTERECTOMY     total  . BREAST BIOPSY Left 02/24/2017    malignant  . BREAST LUMPECTOMY Left 03/23/2017  . broken fingers    . CATARACT EXTRACTION Bilateral   . COLONOSCOPY    . COLONOSCOPY WITH PROPOFOL N/A 06/03/2022   Procedure: COLONOSCOPY WITH PROPOFOL;  Surgeon: [name], MD;  Location: [facility];  Service: Gastroenterology;  Laterality: N/A;  . ESOPHAGOGASTRODUODENOSCOPY (EGD) WITH PROPOFOL N/A 05/29/2022   Procedure: ESOPHAGOGASTRODUODENOSCOPY (EGD) WITH PROPOFOL;  Surgeon: Jerene Bears, MD;  Location: [facility];  Service: Gastroenterology;  Laterality: N/A;  . EXCISIONAL HEMORRHOIDECTOMY    .  IR FLUORO GUIDE PORT INSERTION RIGHT  08/04/2017  . IR REMOVAL TUN ACCESS W/ PORT W/O FL MOD SED  08/04/2017  . IR US GUIDE VASC ACCESS RIGHT  08/04/2017  . IRRIGATION AND DEBRIDEMENT ABSCESS Left 05/18/2017   Procedure: IRRIGATION AND DEBRIDEMENT LEFT AXILLARY ABSCESS;  Surgeon: Michael Boston, MD;  [facility];  Service: General;  Laterality: Left;  . kidney stones lithotripsy    . POLYPECTOMY  06/03/2022   Procedure: POLYPECTOMY;  Surgeon: [name], MD;  Location: [facility];  Service: Gastroenterology;;  . Sol Passer PLACEMENT Right 03/23/2017   Procedure: INSERTION PORT-A-CATH;  Surgeon: Erroll Luna, MD;  Location: [place];  Service: General;  Laterality: Right;  . RADIOACTIVE SEED GUIDED PARTIAL MASTECTOMY WITH AXILLARY SENTINEL LYMPH NODE BIOPSY Left 03/23/2017   Procedure: LEFT BREAST RADIOACTIVE SEED GUIDED PARTIAL MASTECTOMY AND  SENTINEL LYMPH NODE MAPPING;  Surgeon:  Erroll Luna, MD;  Location: [place];  Service: General;  Laterality: Left;  . ROTATOR CUFF REPAIR     left   . TONSILLECTOMY    . WISDOM TOOTH EXTRACTION     Past Medical History:  Diagnosis Date  . Allergy   . Anemia   . Anxiety   . Asthma    in past, no inhalers now  . Blood transfusion without reported diagnosis   . Breast cancer ([place]) 01/2017   left breast   . Breast cancer of upper-inner quadrant of left female breast ([place]) 03/11/2017  . Cancer ([place]) 01/2017   left breast  . Cataract    bilateral removed  . Chronic kidney disease    kidney stones  . Depression 08/31/2013  . Dyslipidemia, goal LDL below 160 08/31/2013  . Essential hypertension - well controlled 08/31/2013  . Family history of breast cancer   . Family history of melanoma   . Family history of prostate cancer   . GERD (gastroesophageal reflux disease)   . Goals of care, counseling/discussion 03/11/2017  . History of radiation therapy 10/13/17-11/11/17   left breast 40.05 Gy in 15 fractions, left breast boost 10 Gy in 5 fractions  . Hypothyroidism   . Ischemic stroke [facility])    May 2023;  . Obesity (BMI 30-39.9) 08/31/2013  . Personal history of chemotherapy 2018  . Personal history of radiation therapy 2019  . Thyroid disease   . Vasovagal near-syncope -rare 08/31/2013   BP 132/77   Pulse 76   Ht '5\' 3"'$  (1.6 m)   Wt 171 lb (77.6 kg)   SpO2 95%   BMI 30.29 kg/m   Opioid Risk Score:   Fall Risk Score:  `1  Depression screen PHQ 2/9     10/05/2022    2:09 PM 04/09/2022    1:51 PM 12/20/2017    3:48 PM 09/27/2017    3:22 PM 04/07/2017   10:06 AM  Depression screen PHQ 2/9  Decreased Interest 1 1 0 0 0  Down, Depressed, Hopeless 1 0 0 0 0  PHQ - 2 Score 2 1 0 0 0  Altered sleeping  0     Tired, decreased energy  2     Change in appetite  0     Feeling bad or failure about yourself   0     Trouble concentrating  0     Moving slowly or fidgety/restless  2     Suicidal  thoughts  0     PHQ-9 Score  5     Difficult doing  work/chores  Not difficult at all       Review of Systems  Genitourinary:  Positive for menstrual problem.  Musculoskeletal:  Positive for gait problem.  Neurological:  Positive for tremors.  Psychiatric/Behavioral:  Positive for confusion.        Depression  All other systems reviewed and are negative.     Objective:   Physical Exam        Assessment & Plan:

## 2022-10-05 NOTE — Progress Notes (Signed)
Subjective:    Patient ID: Jessica [name], female    DOB: 1941-02-19, 81 y.o.   MRN: [id number]  HPI Hospital HPI Brief HPI:   LIISA PICONE is a 81 y.o. right-handed female with history of hypertension hyperlipidemia hypothyroidism asthma depression/anxiety left breast cancer status postmastectomy 2018 as well as radiation therapy, obesity with BMI 31.89.  Per chart review lives alone.  Independent prior to admission.  Presented 03/20/2022 with right-sided weakness and slurred speech with altered mental status and facial droop of acute onset.  CT/MRI showed small focus of acute ischemia within the left caudate body.  No hemorrhage or mass effect.  Old right basal ganglia small vessel infarct and findings of chronic microvascular disease.  Patient did not receive tPA.  CT angiogram head and neck no emergent large vessel occlusion.  Echocardiogram ejection fraction of 60 to 65% no wall motion abnormalities.  Admission chemistries unremarkable except potassium 3.3, urine drug screen negative.  Presently maintained on aspirin and Plavix for CVA prophylaxis x3 months then aspirin alone.  Lovenox for DVT prophylaxis.  Therapy evaluations completed due to patient decreased functional mobility was admitted for a comprehensive rehab program.     Hospital Course: NESHA COUNIHAN was admitted to rehab 03/23/2022 for inpatient therapies to consist of PT, ST and OT at least three hours five days a week. Past admission physiatrist, therapy team and rehab RN have worked together to provide customized collaborative inpatient rehab.  Pertaining to patient's left caudate body infarction remains stable she will continue on aspirin and Plavix x3 months then aspirin alone.  Mood stabilization with the use of Wellbutrin Effexor as well as Remeron with emotional support provided.  Maintain on Lovenox for DVT prophylaxis no bleeding episodes.  Blood pressure controlled on Norvasc's as well as HCTZ and would need  outpatient follow-up.  Synthroid for hypothyroidism.  Lipitor ongoing for hyperlipidemia.  Bouts of constipation resolved with laxative assistance.  Patient did have a history of breast cancer with mastectomy radiation therapy 2018 follow-up outpatient.  Noted obesity with BMI 31.89 dietary follow-up.  There was some question of OSA ambulatory referral obtained with pulmonary services to establish and evaluate.     Interval history 81 year old female with recent rehabilitation stay for acute ischemic infarct left caudate body.  Patient reports she has been seen by PT and OT at home and she has SLP scheduled.  Overall she has been doing well since her discharge from CIR.  She is here with her daughter.  She feels that her strength and speech has improved since her discharge.  She continues to have some left shoulder pain that is chronic since before her injury, Voltaren is helping to keep this controlled.  Her blood pressure was elevated in clinic today but family reports it was well controlled at home.  They say she has whitecoat hypertension.  She denies depression.  She is not able to provide detailed history, mostly answering in one-word answers.  She reports she has most of her medications available besides the modafinil, there was not approved by insurance.  Getting around the walked [place] in September Daughter sees that she is aggravated with family members Cannot hear mom from a different room Daughter feels she would benefit from therapy    Pain Inventory Average Pain 0 Pain Right Now 0 My pain is  no pain    BOWEL Number of stools per week: 3-4   BLADDER Pads    Mobility use a walker ability to  climb steps?  no do you drive?  no Do you have any goals in this area?  yes  Function retired I need assistance with the following:  feeding, dressing, bathing, toileting, meal prep, household duties, and shopping  Neuro/Psych bladder control problems trouble walking  Prior  Studies Any changes since last visit?  no  Physicians involved in your care Any changes since last visit?  no   Family History  Problem Relation Age of Onset   Heart disease Mother        Angina   Heart disease Father    Cervical cancer Maternal Aunt    Heart attack Brother        Died of MI in his mid 41s   Prostate cancer Brother        dx in his early 80s   Breast cancer Paternal Aunt        dx over 4   Bone cancer Cousin        maternal first cousin dx in her 66s-60s   Lung cancer Cousin        paternal first cousin   Colon cancer Neg Hx    Pancreatic cancer Neg Hx    Stomach cancer Neg Hx    Esophageal cancer Neg Hx    Rectal cancer Neg Hx    Social History   Socioeconomic History   Marital status: Married    Spouse name: Not on file   Number of children: 3   Years of education: Not on file   Highest education level: Not on file  Occupational History   Occupation: retired   Tobacco Use   Smoking status: Never    Passive exposure: Past   Smokeless tobacco: Never  Vaping Use   Vaping Use: Never used  Substance and Sexual Activity   Alcohol use: No   Drug use: No   Sexual activity: Not on file  Other Topics Concern   Not on file  Social History Narrative   Married mother of 3 with one grandchild. Never smoked. Does not drink alcohol.   Walks 3-4 days a week for roughly 20-30 minutes.   Social Determinants of Health   Financial Resource Strain: Not on file  Food Insecurity: Not on file  Transportation Needs: Not on file  Physical Activity: Not on file  Stress: Not on file  Social Connections: Not on file   Past Surgical History:  Procedure Laterality Date   ABDOMINAL HYSTERECTOMY     total   BREAST BIOPSY Left 02/24/2017    malignant   BREAST LUMPECTOMY Left 03/23/2017   broken fingers     CATARACT EXTRACTION Bilateral    COLONOSCOPY     COLONOSCOPY WITH PROPOFOL N/A 06/03/2022   Procedure: COLONOSCOPY WITH PROPOFOL;  Surgeon: [name], MD;  [facility];  Service: Gastroenterology;  Laterality: N/A;   ESOPHAGOGASTRODUODENOSCOPY (EGD) WITH PROPOFOL N/A 05/29/2022   Procedure: ESOPHAGOGASTRODUODENOSCOPY (EGD) WITH PROPOFOL;  Surgeon: Jerene Bears, MD;  [facility];  Service: Gastroenterology;  Laterality: N/A;   EXCISIONAL HEMORRHOIDECTOMY     IR FLUORO GUIDE PORT INSERTION RIGHT  08/04/2017   IR REMOVAL TUN ACCESS W/ PORT W/O FL MOD SED  08/04/2017   IR US GUIDE VASC ACCESS RIGHT  08/04/2017   IRRIGATION AND DEBRIDEMENT ABSCESS Left 05/18/2017   Procedure: IRRIGATION AND DEBRIDEMENT LEFT AXILLARY ABSCESS;  Surgeon: Michael Boston, MD;  [facility];  Service: General;  Laterality: Left;   kidney stones lithotripsy  POLYPECTOMY  06/03/2022   Procedure: POLYPECTOMY;  Surgeon: [name], MD;  [facility];  Service: Gastroenterology;;   Sol Passer PLACEMENT Right 03/23/2017   Procedure: INSERTION PORT-A-CATH;  Surgeon: Erroll Luna, MD;  Location: [place];  Service: General;  Laterality: Right;   RADIOACTIVE SEED GUIDED PARTIAL MASTECTOMY WITH AXILLARY SENTINEL LYMPH NODE BIOPSY Left 03/23/2017   Procedure: LEFT BREAST RADIOACTIVE SEED GUIDED PARTIAL MASTECTOMY AND  SENTINEL LYMPH NODE [place];  Surgeon: Erroll Luna, MD;  Location: [place];  Service: General;  Laterality: Left;   ROTATOR CUFF REPAIR     left    TONSILLECTOMY     WISDOM TOOTH EXTRACTION     Past Medical History:  Diagnosis Date   Allergy    Anemia    Anxiety    Asthma    in past, no inhalers now   Blood transfusion without reported diagnosis    Breast cancer ([place]) 01/2017   left breast    Breast cancer of upper-inner quadrant of left female breast ([place]) 03/11/2017   Cancer ([place]) 01/2017   left breast   Cataract    bilateral removed   Chronic kidney disease    kidney stones   Depression 08/31/2013   Dyslipidemia, goal LDL below 160 08/31/2013   Essential  hypertension - well controlled 08/31/2013   Family history of breast cancer    Family history of melanoma    Family history of prostate cancer    GERD (gastroesophageal reflux disease)    Goals of care, counseling/discussion 03/11/2017   History of radiation therapy 10/13/17-11/11/17   left breast 40.05 Gy in 15 fractions, left breast boost 10 Gy in 5 fractions   Hypothyroidism    Ischemic stroke [facility])    May 2023;   Obesity (BMI 30-39.9) 08/31/2013   Personal history of chemotherapy 2018   Personal history of radiation therapy 2019   Thyroid disease    Vasovagal near-syncope -rare 08/31/2013   BP 132/77   Pulse 76   Ht '5\' 3"'$  (1.6 m)   Wt 171 lb (77.6 kg)   SpO2 95%   BMI 30.29 kg/m   Opioid Risk Score:   Fall Risk Score:  `1  Depression screen PHQ 2/9     10/05/2022    2:09 PM 04/09/2022    1:51 PM 12/20/2017    3:48 PM 09/27/2017    3:22 PM 04/07/2017   10:06 AM  Depression screen PHQ 2/9  Decreased Interest 1 1 0 0 0  Down, Depressed, Hopeless 1 0 0 0 0  PHQ - 2 Score 2 1 0 0 0  Altered sleeping  0     Tired, decreased energy  2     Change in appetite  0     Feeling bad or failure about yourself   0     Trouble concentrating  0     Moving slowly or fidgety/restless  2     Suicidal thoughts  0     PHQ-9 Score  5     Difficult doing work/chores  Not difficult at all        Review of Systems  Constitutional: Negative.   HENT: Negative.    Eyes: Negative.   Respiratory: Negative.    Cardiovascular: Negative.   Gastrointestinal: Negative.   Endocrine: Negative.   Genitourinary:  Positive for difficulty urinating.  Musculoskeletal:  Positive for gait problem.  Skin: Negative.   Allergic/Immunologic: Negative.   Hematological: Negative.   Psychiatric/Behavioral:  Negative.        Objective:   Physical Exam    Vitals:   04/09/22 1340  BP: (!) 158/81  Pulse: 71  SpO2: 92%   Gen: no distress, normal appearing HEENT: oral mucosa pink and moist,  NCAT Cardio: Reg rate Chest: normal effort, normal rate of breathing Abd: soft, non-distended Ext: no edema Psych: pleasant, normal affect Skin: intact Neuro: She is alert and oriented to person place time and situation, cranial nerves II through XII intact other than right facial weakness facial weakness and dysarthria, sensation intact to light touch in all 4 extremities, she is able to repeat 3 words correctly, she is able to name 3 objects correctly, deep tendon reflexes decreased and symmetric throughout Musculoskeletal: Normal resting tone Strength 4 out of 5 throughout right upper extremity, 4- out of 5 left shoulder abduction due to chronic rotator cuff injury, otherwise left upper extremity 4+ out of 5 Right lower extremity 4 - hip flexion, otherwise 4 out of 5 Left lower extremity 4- out of 5 hip flexion, otherwise 4+ out of 5 L shoulder pain- using voltaren, chronic injury Walking with walker, step through, decreased step length Left shoulder pain with abduction, chronic     Assessment & Plan:    1. Functional deficits secondary to acute ischemic infarct left caudate body.             -Placed new orders for PT OT SLP  -Aspirin 81 mg daily and Plavix 75 mg day x3 months then aspirin alone as noted by neurology services Dr.Palikh   2. Shoulder pain -Continue Tylenol and Voltaren as needed  3.  Hypertension.  Hydrochlorothiazide 12.5 mg daily amlodipine '10mg'$   -She reports her blood pressure is well controlled at home, advise follow-up with primary care physician  4.  GERD.  Protonix, she can also switch back her home Prilosec  5. Hypokalemia: She was discharged on potassium supplement -After visit I noted plans to have BMP completed to f/u on this, I called and advised to have this completed, I ordered BMP lab   6.  Daytime somnolence: continue Modafinil '100mg'$  daily not approved by insurance, family reports she is participating with therapy and not limited by significant  somnolence during this time  7. OSA -Provided counseling regarding benefits of OSA treatment  8) Constipation:  -Provided list of following foods that help with constipation and highlighted a few: 1) prunes- contain high amounts of fiber.  2) apples- has a form of dietary fiber called pectin that accelerates stool movement and increases beneficial gut bacteria 3) pears- in addition to fiber, also high in fructose and sorbitol which have laxative effect 4) figs- contain an enzyme ficin which helps to speed colonic transit 5) kiwis- contain an enzyme actinidin that improves gut motility and reduces constipation 6) oranges- rich in pectin (like apples) 7) grapefruits- contain a flavanol naringenin which has a laxative effect 8) vegetables- rich in fiber and also great sources of folate, vitamin C, and K 9) artichoke- high in inulin, prebiotic great for the microbiome 10) chicory- increases stool frequency and softness (can be added to coffee) 11) rhubarb- laxative effect 12) sweet potato- high fiber 13) beans, peas, and lentils- contain both soluble and insoluble fiber 14) chia seeds- improves intestinal health and gut flora 15) flaxseeds- laxative effect 16) whole grain rye bread- high in fiber 17) oat bran- high in soluble and insoluble fiber 18) kefir- softens stools -recommended to try at least one of these foods  every day.  -drink 6-8 glasses of water per day -walk regularly, especially after meals.

## 2022-10-13 ENCOUNTER — Ambulatory Visit: Payer: Medicare Other

## 2022-10-13 ENCOUNTER — Other Ambulatory Visit: Payer: Self-pay

## 2022-10-13 ENCOUNTER — Ambulatory Visit: Payer: Medicare Other | Admitting: Physical Therapy

## 2022-10-13 ENCOUNTER — Encounter: Payer: Self-pay | Admitting: Physical Therapy

## 2022-10-13 ENCOUNTER — Ambulatory Visit: Payer: Medicare Other | Attending: Physical Medicine and Rehabilitation | Admitting: Occupational Therapy

## 2022-10-13 DIAGNOSIS — R41841 Cognitive communication deficit: Secondary | ICD-10-CM | POA: Insufficient documentation

## 2022-10-13 DIAGNOSIS — R293 Abnormal posture: Secondary | ICD-10-CM | POA: Diagnosis not present

## 2022-10-13 DIAGNOSIS — M6281 Muscle weakness (generalized): Secondary | ICD-10-CM | POA: Diagnosis not present

## 2022-10-13 DIAGNOSIS — R4701 Aphasia: Secondary | ICD-10-CM | POA: Insufficient documentation

## 2022-10-13 DIAGNOSIS — M25511 Pain in right shoulder: Secondary | ICD-10-CM | POA: Insufficient documentation

## 2022-10-13 DIAGNOSIS — G8929 Other chronic pain: Secondary | ICD-10-CM | POA: Insufficient documentation

## 2022-10-13 DIAGNOSIS — R2689 Other abnormalities of gait and mobility: Secondary | ICD-10-CM

## 2022-10-13 DIAGNOSIS — R2681 Unsteadiness on feet: Secondary | ICD-10-CM

## 2022-10-13 DIAGNOSIS — R471 Dysarthria and anarthria: Secondary | ICD-10-CM | POA: Insufficient documentation

## 2022-10-13 DIAGNOSIS — I69354 Hemiplegia and hemiparesis following cerebral infarction affecting left non-dominant side: Secondary | ICD-10-CM | POA: Diagnosis not present

## 2022-10-13 NOTE — Therapy (Signed)
OUTPATIENT PHYSICAL THERAPY NEURO EVALUATION   Patient Name: Jessica [name] MRN: [id number] DOB:07/21/1941, 81 y.o., female Today's Date: 10/14/2022   PCP: [name], MD REFERRING PROVIDER: Izora Ribas, MD  END OF SESSION:  PT End of Session - 10/13/22 0917     Visit Number 1    Number of Visits 17    Date for PT Re-Evaluation 12/11/22    Authorization Type [employer]    PT Start Time 0935   comes from OT eval   PT Stop Time 2671    PT Time Calculation (min) 40 min    Activity Tolerance Patient limited by lethargy    Behavior During Therapy Flat affect             Past Medical History:  Diagnosis Date   Allergy    Anemia    Anxiety    Asthma    in past, no inhalers now   Blood transfusion without reported diagnosis    Breast cancer ([place]) 01/2017   left breast    Breast cancer of upper-inner quadrant of left female breast ([place]) 03/11/2017   Cancer ([place]) 01/2017   left breast   Cataract    bilateral removed   Chronic kidney disease    kidney stones   Depression 08/31/2013   Dyslipidemia, goal LDL below 160 08/31/2013   Essential hypertension - well controlled 08/31/2013   Family history of breast cancer    Family history of melanoma    Family history of prostate cancer    GERD (gastroesophageal reflux disease)    Goals of care, counseling/discussion 03/11/2017   History of radiation therapy 10/13/17-11/11/17   left breast 40.05 Gy in 15 fractions, left breast boost 10 Gy in 5 fractions   Hypothyroidism    Ischemic stroke [facility])    May 2023;   Obesity (BMI 30-39.9) 08/31/2013   Personal history of chemotherapy 2018   Personal history of radiation therapy 2019   Thyroid disease    Vasovagal near-syncope -rare 08/31/2013   Past Surgical History:  Procedure Laterality Date   ABDOMINAL HYSTERECTOMY     total   BREAST BIOPSY Left 02/24/2017    malignant   BREAST LUMPECTOMY Left 03/23/2017   broken fingers     CATARACT EXTRACTION  Bilateral    COLONOSCOPY     COLONOSCOPY WITH PROPOFOL N/A 06/03/2022   Procedure: COLONOSCOPY WITH PROPOFOL;  Surgeon: [name], MD;  Location: [facility];  Service: Gastroenterology;  Laterality: N/A;   ESOPHAGOGASTRODUODENOSCOPY (EGD) WITH PROPOFOL N/A 05/29/2022   Procedure: ESOPHAGOGASTRODUODENOSCOPY (EGD) WITH PROPOFOL;  Surgeon: Jerene Bears, MD;  Location: [facility];  Service: Gastroenterology;  Laterality: N/A;   EXCISIONAL HEMORRHOIDECTOMY     IR FLUORO GUIDE PORT INSERTION RIGHT  08/04/2017   IR REMOVAL TUN ACCESS W/ PORT W/O FL MOD SED  08/04/2017   IR US GUIDE VASC ACCESS RIGHT  08/04/2017   IRRIGATION AND DEBRIDEMENT ABSCESS Left 05/18/2017   Procedure: IRRIGATION AND DEBRIDEMENT LEFT AXILLARY ABSCESS;  Surgeon: Michael Boston, MD;  Location: [facility];  Service: General;  Laterality: Left;   kidney stones lithotripsy     POLYPECTOMY  06/03/2022   Procedure: POLYPECTOMY;  Surgeon: [name], MD;  Location: [facility];  Service: Gastroenterology;;   Sol Passer PLACEMENT Right 03/23/2017   Procedure: INSERTION PORT-A-CATH;  Surgeon: Erroll Luna, MD;  Location: [facility];  Service: General;  Laterality: Right;   RADIOACTIVE SEED GUIDED PARTIAL MASTECTOMY WITH AXILLARY SENTINEL LYMPH NODE BIOPSY  Left 03/23/2017   Procedure: LEFT BREAST RADIOACTIVE SEED GUIDED PARTIAL MASTECTOMY AND  SENTINEL LYMPH NODE MAPPING;  Surgeon: Erroll Luna, MD;  Location: [facility];  Service: General;  Laterality: Left;   ROTATOR CUFF REPAIR     left    TONSILLECTOMY     WISDOM TOOTH EXTRACTION     Patient Active Problem List   Diagnosis Date Noted   Altered mental status 06/11/2022   Acute encephalopathy 06/10/2022   UTI (urinary tract infection) 06/10/2022   Constipation    History of stroke in adulthood 06/05/2022   Heme positive stool    Iron deficiency anemia due to chronic blood loss    Left thalamic infarction ([place]) 03/23/2022   Acquired  hammer toe of right foot 08/19/2021   Adverse reaction to ACE-I (angiotensin-converting enzyme inhibitor) 08/19/2021   Allergic rhinitis 08/19/2021   Anxiety 08/19/2021   Asthma 08/19/2021   Cardiomegaly 08/19/2021   Dependence on other enabling machines and devices 08/19/2021   History of adenomatous polyp of colon 08/19/2021   History of pneumonia 08/19/2021   Internal hemorrhoid 08/19/2021   Iron deficiency anemia secondary to inadequate dietary iron intake 08/19/2021   Osteopenia 08/19/2021   Personal history of malignant neoplasm of breast 08/19/2021   Somnolence 08/19/2021   Vitamin D deficiency 08/19/2021   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   Insomnia 06/25/2019   Obstructive sleep apnea 06/13/2019   Genetic testing 06/29/2017   Family history of melanoma    Family history of prostate cancer    Family history of breast cancer    Angioedema 05/26/2017   Left axillary seroma status post incision and drainage 05/18/2017 05/18/2017   Cellulitis of left axilla 05/17/2017   GERD (gastroesophageal reflux disease) 05/16/2017   Acquired hypothyroidism 05/16/2017   Depression with anxiety 05/16/2017   Breast cancer of upper-inner quadrant of left female breast ([place]) 03/11/2017   Goals of care, counseling/discussion 03/11/2017   Obesity (BMI 30-39.9) 08/31/2013   Essential hypertension - well controlled 08/31/2013    Class: Diagnosis of   Depression 08/31/2013    Class: Diagnosis of   Vasovagal near-syncope -rare 08/31/2013    Class: History of   Dyslipidemia, goal LDL below 160 08/31/2013    ONSET DATE: 10/05/2022  REFERRING DIAG: I63.50 (ICD-10-CM) - Cerebrovascular accident (CVA) due to occlusion of cerebral artery ([facility])   THERAPY DIAG:  Other abnormalities of gait and mobility  Unsteadiness on feet  Muscle weakness (generalized)  Rationale for Evaluation and Treatment: Rehabilitation  SUBJECTIVE:  SUBJECTIVE STATEMENT: Pt reports she is ready to get back to my house.  Wants to be able to not have to use the walker.  Had a stroke in May, then CIR, then HHPT-hasn't had HHPT since October. Pt accompanied by: family member, daughter   PERTINENT HISTORY: HTN, HLD, hypothyroidism, asthma, depression, anxiety, L breast cancer post-mastectomy 2018, obesity; 03/20/22 with CVA R sided weakness  PAIN:  Are you having pain? Yes: NPRS scale: 4/10 Pain location: shoulder Pain description: aching Aggravating factors: sleeping on it wrong Relieving factors: rubbing it *OT aware PRECAUTIONS: Fall  WEIGHT BEARING RESTRICTIONS: No  FALLS: Has patient fallen in last 6 months? Yes. Number of falls 1  LIVING ENVIRONMENT: Lives with: lives with their family and lives with their daughter Lives in: House/apartment Stairs: No Has following equipment at home: Environmental consultant - 2 wheeled, Environmental consultant - 4 wheeled, and shower chair  PLOF: Independent  PATIENT GOALS: To get back home, to get off the walker  OBJECTIVE:   DIAGNOSTIC FINDINGS: MRI 06/11/22-no changes; 03/21/22 MRI:  acute ischemia within the L caudate body; old R basal ganglia small vessel infarct  COGNITION: Overall cognitive status:  Dysarthria per notes; flat affect   SENSATION: Light touch: WFL   POSTURE: rounded shoulders and forward head  LOWER EXTREMITY ROM:   WFL for BLEs  Active  Right Eval Left Eval  Hip flexion    Hip extension    Hip abduction    Hip adduction    Hip internal rotation    Hip external rotation    Knee flexion    Knee extension    Ankle dorsiflexion    Ankle plantarflexion    Ankle inversion    Ankle eversion     (Blank rows = not tested)  LOWER EXTREMITY MMT:    MMT Right Eval Left Eval  Hip flexion 3+ 3+  Hip extension    Hip abduction 3+ 3+  Hip adduction 3 3  Hip internal  rotation    Hip external rotation    Knee flexion 4 3+  Knee extension 3+ 3+  Ankle dorsiflexion 3+ 3-  Ankle plantarflexion    Ankle inversion    Ankle eversion    (Blank rows = not tested)  BED MOBILITY:  Requires assist of family, daughter reports providing 50% assistance  TRANSFERS: Assistive device utilized:  UE support to stand at locked rollator   Sit to stand: SBA with UE support Stand to sit: CGA with UE support   GAIT: Gait pattern: step through pattern, decreased step length- Right, decreased step length- Left, and poor foot clearance- Right Distance walked: 50 ft Assistive device utilized: Walker - 4 wheeled Level of assistance: CGA Comments: 10 M walk:  20.34 sec (1.61 ft/sec)  FUNCTIONAL TESTS:  5 times sit to stand: 41.35 sec with UE support Timed up and go (TUG): 45.90 sec with 4-wheeled RW Berg Balance Scale: 24/56 10 M walk:  1.61 ft/sec  with RW  PATIENT SURVEYS:  FOTO intake score 51; predicted score at discharge 57      PATIENT EDUCATION: Education details: Eval results, POC Person educated: Patient and Child(ren) Education method: Explanation Education comprehension: verbalized understanding  HOME EXERCISE PROGRAM: Not yet initiated  GOALS: Goals reviewed with patient? Yes  SHORT TERM GOALS: Target date: 11/13/2022  Pt will be supervision with HEP for improved strength, balance, gait. Baseline: Goal status: INITIAL  2.  Pt will improve 5x sit<>stand to less than or equal to 30 sec to demonstrate  improved functional strength and transfer efficiency.  Baseline: 41.35 sec Goal status: INITIAL  3.  Pt will improve Berg score to at least 30/56 to decrease fall risk. Baseline: 24/56 Goal status: INITIAL  4.  Pt will improve TUG score to less than or equal to 35 sec for decreased fall risk. Baseline: 45.9 sec Goal status: INITIAL  LONG TERM GOALS: Target date: 12/11/2022  Pt will be supervision with HEP for improved strength, balance,  gait. Baseline:  Goal status: INITIAL  2.  Pt will improve 5x sit<>stand to less than or equal to 20 sec to demonstrate improved functional strength and transfer efficiency.  Baseline:  Goal status: INITIAL  3.  Pt will improve Berg score to at least 40/56 to decrease fall risk. Baseline:  Goal status: INITIAL  4.  Pt will improve TUG score to less than or equal to 20 sec for decreased fall risk. Baseline:  Goal status: INITIAL  5.  Pt will improve gait velocity to at least 2 ft/sec for improved gait efficiency and safety. Baseline: 1.61 ft/sec Goal status: INITIAL  ASSESSMENT:  CLINICAL IMPRESSION: Patient is an 81 y.o. female who was seen today for physical therapy evaluation and treatment for CVA 03/2022.  She participated in therapies in CIR and was discharged home, where she had home health therapies until October 2023.  Prior to hospitalization, she was independent and lived alone.  She currently lives with daughter, with family assist nearby, and she uses RW for mobility.  She has had one fall in the past 6 months.  She presents to OPPT eval with decreased strength, decreased balance, decreased independence with transfers and gait.  She is at fall risk per FTSTS, TUG, Berg, and gait velocity measures.  She would benefit from skilled PT to address the above stated deficits to decrease fall risk and improve overall functional mobility and independence.  OBJECTIVE IMPAIRMENTS: Abnormal gait, decreased balance, decreased mobility, difficulty walking, decreased strength, and postural dysfunction.   ACTIVITY LIMITATIONS: lifting, bending, standing, squatting, transfers, bed mobility, toileting, hygiene/grooming, and locomotion level  PARTICIPATION LIMITATIONS: meal prep, cleaning, laundry, driving, shopping, and community activity  PERSONAL FACTORS: Time since onset of injury/illness/exacerbation and 3+ comorbidities: see above for PMH  are also affecting patient's functional outcome.    REHAB POTENTIAL: Good  CLINICAL DECISION MAKING: Evolving/moderate complexity  EVALUATION COMPLEXITY: Moderate  PLAN:  PT FREQUENCY: 2x/week  PT DURATION: 8 weeks plus 1x/wk (during eval week)-9 week POC  PLANNED INTERVENTIONS: Therapeutic exercises, Therapeutic activity, Neuromuscular re-education, Balance training, Gait training, Patient/Family education, and Self Care  PLAN FOR NEXT SESSION: Initiate HEP for strength, balance, walking program for home   Ekam Besson W., PT 10/14/2022, 7:45 AM  [facility] [address], [place], [place] [address] Phone # [phone] Fax # [phone]

## 2022-10-13 NOTE — Therapy (Signed)
OUTPATIENT OCCUPATIONAL THERAPY NEURO EVALUATION  Patient Name: Jessica [name] MRN: [id number] DOB:21-Aug-1941, 81 y.o., female Today's Date: 10/13/2022  PCP: [name], MD REFERRING PROVIDER: Izora Ribas, MD  END OF SESSION:  OT End of Session - 10/13/22 [phone]     Visit Number 1    Number of Visits 17    Date for OT Re-Evaluation 12/11/22    Authorization Type [employer]    OT Start Time [phone]    OT Stop Time 0935    OT Time Calculation (min) 43 min             Past Medical History:  Diagnosis Date   Allergy    Anemia    Anxiety    Asthma    in past, no inhalers now   Blood transfusion without reported diagnosis    Breast cancer ([place]) 01/2017   left breast    Breast cancer of upper-inner quadrant of left female breast ([place]) 03/11/2017   Cancer ([place]) 01/2017   left breast   Cataract    bilateral removed   Chronic kidney disease    kidney stones   Depression 08/31/2013   Dyslipidemia, goal LDL below 160 08/31/2013   Essential hypertension - well controlled 08/31/2013   Family history of breast cancer    Family history of melanoma    Family history of prostate cancer    GERD (gastroesophageal reflux disease)    Goals of care, counseling/discussion 03/11/2017   History of radiation therapy 10/13/17-11/11/17   left breast 40.05 Gy in 15 fractions, left breast boost 10 Gy in 5 fractions   Hypothyroidism    Ischemic stroke [facility])    May 2023;   Obesity (BMI 30-39.9) 08/31/2013   Personal history of chemotherapy 2018   Personal history of radiation therapy 2019   Thyroid disease    Vasovagal near-syncope -rare 08/31/2013   Past Surgical History:  Procedure Laterality Date   ABDOMINAL HYSTERECTOMY     total   BREAST BIOPSY Left 02/24/2017    malignant   BREAST LUMPECTOMY Left 03/23/2017   broken fingers     CATARACT EXTRACTION Bilateral    COLONOSCOPY     COLONOSCOPY WITH PROPOFOL N/A 06/03/2022   Procedure: COLONOSCOPY WITH PROPOFOL;   Surgeon: [name], MD;  Location: [facility];  Service: Gastroenterology;  Laterality: N/A;   ESOPHAGOGASTRODUODENOSCOPY (EGD) WITH PROPOFOL N/A 05/29/2022   Procedure: ESOPHAGOGASTRODUODENOSCOPY (EGD) WITH PROPOFOL;  Surgeon: Jerene Bears, MD;  Location: [facility];  Service: Gastroenterology;  Laterality: N/A;   EXCISIONAL HEMORRHOIDECTOMY     IR FLUORO GUIDE PORT INSERTION RIGHT  08/04/2017   IR REMOVAL TUN ACCESS W/ PORT W/O FL MOD SED  08/04/2017   IR US GUIDE VASC ACCESS RIGHT  08/04/2017   IRRIGATION AND DEBRIDEMENT ABSCESS Left 05/18/2017   Procedure: IRRIGATION AND DEBRIDEMENT LEFT AXILLARY ABSCESS;  Surgeon: Michael Boston, MD;  Location: [facility];  Service: General;  Laterality: Left;   kidney stones lithotripsy     POLYPECTOMY  06/03/2022   Procedure: POLYPECTOMY;  Surgeon: [name], MD;  Location: [facility];  Service: Gastroenterology;;   PORTACATH PLACEMENT Right 03/23/2017   Procedure: INSERTION PORT-A-CATH;  Surgeon: Erroll Luna, MD;  Location: [place] [facility];  Service: General;  Laterality: Right;   RADIOACTIVE SEED GUIDED PARTIAL MASTECTOMY WITH AXILLARY SENTINEL LYMPH NODE BIOPSY Left 03/23/2017   Procedure: LEFT BREAST RADIOACTIVE SEED GUIDED PARTIAL MASTECTOMY AND  SENTINEL LYMPH NODE MAPPING;  Surgeon: Erroll Luna, MD;  Location: [facility];  Service: General;  Laterality: Left;   ROTATOR CUFF REPAIR     left    TONSILLECTOMY     WISDOM TOOTH EXTRACTION     Patient Active Problem List   Diagnosis Date Noted   Altered mental status 06/11/2022   Acute encephalopathy 06/10/2022   UTI (urinary tract infection) 06/10/2022   Constipation    History of stroke in adulthood 06/05/2022   Heme positive stool    Iron deficiency anemia due to chronic blood loss    Left thalamic infarction ([place]) 03/23/2022   Acquired hammer toe of right foot 08/19/2021   Adverse reaction to ACE-I (angiotensin-converting enzyme inhibitor)  08/19/2021   Allergic rhinitis 08/19/2021   Anxiety 08/19/2021   Asthma 08/19/2021   Cardiomegaly 08/19/2021   Dependence on other enabling machines and devices 08/19/2021   History of adenomatous polyp of colon 08/19/2021   History of pneumonia 08/19/2021   Internal hemorrhoid 08/19/2021   Iron deficiency anemia secondary to inadequate dietary iron intake 08/19/2021   Osteopenia 08/19/2021   Personal history of malignant neoplasm of breast 08/19/2021   Somnolence 08/19/2021   Vitamin D deficiency 08/19/2021   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   Insomnia 06/25/2019   Obstructive sleep apnea 06/13/2019   Genetic testing 06/29/2017   Family history of melanoma    Family history of prostate cancer    Family history of breast cancer    Angioedema 05/26/2017   Left axillary seroma status post incision and drainage 05/18/2017 05/18/2017   Cellulitis of left axilla 05/17/2017   GERD (gastroesophageal reflux disease) 05/16/2017   Acquired hypothyroidism 05/16/2017   Depression with anxiety 05/16/2017   Breast cancer of upper-inner quadrant of left female breast ([place]) 03/11/2017   Goals of care, counseling/discussion 03/11/2017   Obesity (BMI 30-39.9) 08/31/2013   Essential hypertension - well controlled 08/31/2013    Class: Diagnosis of   Depression 08/31/2013    Class: Diagnosis of   Vasovagal near-syncope -rare 08/31/2013    Class: History of   Dyslipidemia, goal LDL below 160 08/31/2013    ONSET DATE: 03/20/22  REFERRING DIAG: I63.50 (ICD-10-CM) - Cerebrovascular accident (CVA) due to occlusion of cerebral artery  THERAPY DIAG:  Hemiplegia and hemiparesis following cerebral infarction affecting left non-dominant side (HCC)  Muscle weakness (generalized)  Other abnormalities of gait and mobility  Unsteadiness on feet  Abnormal posture  Chronic right shoulder pain  Rationale for Evaluation and Treatment: Rehabilitation  SUBJECTIVE:   SUBJECTIVE STATEMENT: Pt reports  that she can get up/down from chairs as long as it has arm rests, is walking well with RW/rollator but would like to progress to LRAD.  Pt and daughter reports requiring supervision for all mobility and bathroom transfers and requiring assistance with clothing management with toileting. Pt has an aide from 8-4, who assists with getting pt dressed, preparing breakfast, and assists with exercises.  Pt had home health until Sept 2023 and was progressing towards use of cane.  Pt with decreased engagement in home health therapies and daughter reports that she was doing more when being seen by [facility].  Pt told PM&R that she is now motivated to move away from [place] and willing to attempt therapies again.  Pt accompanied by: self and family member (daughter, [name])  PERTINENT HISTORY: Past medical history of triple negative stage IIa ductal carcinoma of the left breast cancer s/p partial mastectomy 2018, HLD, HTN, CVA May 2023 with R residual deficits and expressive aphaisa.  PRECAUTIONS:  Shoulder  WEIGHT BEARING RESTRICTIONS: No  PAIN:  Are you having pain? No  FALLS: Has patient fallen in last 6 months? Yes. Number of falls 1 fall (and another near fall)  LIVING ENVIRONMENT: Lives with: lives with their family (living with daughter Judeen Hammans since [place]) Lives in: House/apartment Stairs: Yes: External: 1 high brick steps; none Has following equipment at home: Lobbyist, Environmental consultant - 2 wheeled, Environmental consultant - 4 wheeled, shower chair, and Grab bars  PLOF: Independent  PATIENT GOALS: to be like I was before  OBJECTIVE:   HAND DOMINANCE: Right  ADLs: Overall ADLs: aide assists with majority of bathing/dressing Transfers/ambulation related to ADLs: Supervision with RW in home, Rollator in community Eating: difficulty with cutting foods, spilling food off utensil due to inconsistent tremor like movements Grooming: Supervision UB Dressing: Max assist LB Dressing: Max assist Toileting: assist to pull up  pants after toileting Bathing: Mod assist Tub Shower transfers: Min - HHA with transfers in/out of tub/shower Equipment: Shower seat with back and Grab bars  IADLs: Meal Prep: will occasionally pour cereal or fix oatmeal, aide does majority of other meal tasks Medication management: [facility] prepares packages of meds as prescribed and caregiver hands them to pt to take  MOBILITY STATUS: Needs Assist: Supervision with RW/Rollator  POSTURE COMMENTS:  weight shift right  ACTIVITY TOLERANCE: Activity tolerance: decreased engagement in tasks, requiring prolonged engagement and encouragement from OT for increased participation in self-care tasks at home and structured tasks with OT  UPPER EXTREMITY ROM:    Active ROM Right eval Left eval  Shoulder flexion 84 (h/o shoulder injury and surgery) 95  Shoulder abduction    Shoulder adduction    Shoulder extension    Shoulder internal rotation    Shoulder external rotation    Elbow flexion 80*   Elbow extension -10*   Wrist flexion    Wrist extension    Wrist ulnar deviation    Wrist radial deviation    Wrist pronation    Wrist supination    (Blank rows = not tested)  UPPER EXTREMITY MMT:   not tested due to pain with AROM and attempts at PROM   COORDINATION: TBD, majority of eval on rapport building and focus on ADLs  SENSATION: Light touch: Impaired   COGNITION: Overall cognitive status: Impaired and flat affect  VISION: Subjective report: wears glasses  VISION ASSESSMENT: To be further assessed in functional context  OBSERVATIONS: Pt with flat affect and somewhat withdrawn.  [name] daughter reports that pt was able to complete ADLs at higher level after leaving [facility] but has allowed aide to assist with ADLs and provide hand held assist with transitional movements.   TODAY'S TREATMENT:                                                                        Engaged in education on ADL re-training with focus on modified  techniques, positioning, and AE as needed to increase independence with self-care tasks.  Pt reports caregiver assists with bathing and dressing at mod-max assist level, with encouragement and explanation pt open to attempting modifications and trials to decrease burden of care and increase independence.  PATIENT EDUCATION: Education details: Educated on role and purpose  of OT as well as potential interventions and goals for therapy based on initial evaluation findings. Person educated: Patient and Child(ren) Education method: Customer service manager Education comprehension: verbalized understanding and needs further education  HOME EXERCISE PROGRAM: TBD   GOALS: Goals reviewed with patient? No  SHORT TERM GOALS: Target date: 11/13/22  Pt  and caregiver will verbalize understanding of task modifications and/or potential AE needs to increase ease, safety, and independence w/ ADLs. Baseline: Goal status: INITIAL  2.  Pt will complete toilet transfer with supervision and no verbal cues 5 out of 6 attempts to demonstrate improved mobility and progress to decreasing level of supervision. Baseline: currently CGA to min assist Goal status: INITIAL  3.  Pt will be able to manage clothing up/down for toileting with distant supervision to progress to increased independence with toileting tasks. Baseline: currently min-mod assist Goal status: INITIAL  4.  Pt will be able to complete UB dressing with supervision assist (with exception of bra). Baseline: currently max assist Goal status: INITIAL  LONG TERM GOALS: Target date: 12/11/22  Pt will be able to complete LB dressing with supervision, to include donning socks and shoes. Baseline: currently max assist Goal status: INITIAL  2.  Pt will be able to don bra at Mod I level. Baseline: unable to complete Goal status: INITIAL  3.  Pt will be able to complete all aspects of toileting (transfer, clothing management, and hygiene) at Mod  I level with LRAD. Baseline: Min-mod assist Goal status: INITIAL  4.  Pt will be able to maintain dynamic standing balance for 10 mins w/o LOB using DME and/or countertop support prn to increase ease, safety, and independence with engagement in meal prep tasks. Baseline:  Goal status: INITIAL  5.  Patient will demonstrate sufficient range of motion and activity tolerance to use BUE to shampoo her hair in the shower  Baseline: RUE 84* and LUE 95* shoulder ROM Goal status: INITIAL   ASSESSMENT:  CLINICAL IMPRESSION: Patient is a 81 y.o. female who was seen today for occupational therapy evaluation for impairments impacting safety and independence post CVA. Pt lived alone and was independent prior to CVA, now residing with daughter in a townhome with 1 STE.  Pt is requiring mod-max assist for bathing and dressing tasks due to decreased motivation and participation in self-care tasks.  Pt has an aide who assists with ADLs and meal prep tasks.  Pt will benefit from skilled occupational therapy services to increase independence and safety with ADLs and IADLs, strength and coordination, ROM, pain management, balance, cognition, safety awareness, introduction of compensatory strategies/AE prn, and implementation of an HEP to improve participation and safety during ADLs and IADLs.   PERFORMANCE DEFICITS: in functional skills including ADLs, IADLs, coordination, ROM, strength, pain, flexibility, Gross motor control, balance, body mechanics, endurance, decreased knowledge of precautions, decreased knowledge of use of DME, and UE functional use, cognitive skills including emotional, energy/drive, problem solving, safety awareness, and sequencing, and psychosocial skills including coping strategies, environmental adaptation, habits, and routines and behaviors.   IMPAIRMENTS: are limiting patient from ADLs and IADLs.   CO-MORBIDITIES: may have co-morbidities  that affects occupational performance. Patient  will benefit from skilled OT to address above impairments and improve overall function.  MODIFICATION OR ASSISTANCE TO COMPLETE EVALUATION: Min-Moderate modification of tasks or assist with assess necessary to complete an evaluation.  OT OCCUPATIONAL PROFILE AND HISTORY: Problem focused assessment: Including review of records relating to presenting problem.  CLINICAL DECISION MAKING: Moderate -  several treatment options, min-mod task modification necessary  REHAB POTENTIAL: Good  EVALUATION COMPLEXITY: Moderate    PLAN:  OT FREQUENCY: 2x/week  OT DURATION: 8 weeks  PLANNED INTERVENTIONS: self care/ADL training, therapeutic exercise, therapeutic activity, neuromuscular re-education, manual therapy, passive range of motion, balance training, functional mobility training, ultrasound, compression bandaging, moist heat, cryotherapy, cognitive remediation/compensation, psychosocial skills training, energy conservation, coping strategies training, and DME and/or AE instructions  RECOMMENDED OTHER SERVICES: NA  CONSULTED AND AGREED WITH PLAN OF CARE: Patient and family member/caregiver  PLAN FOR NEXT SESSION: Educate on adaptive strategies and/or AE for LB dressing, focus on sit > stand and pulling pants up/down, short distance ambulatory transfers as needed to increase independence with toileting.   Simonne Come, OTR/L 10/13/2022, 9:39 AM

## 2022-10-13 NOTE — Therapy (Incomplete)
OUTPATIENT SPEECH LANGUAGE PATHOLOGY EVALUATION   Patient Name: Jessica [name] MRN: [id number] DOB:1941/09/21, 81 y.o., female Today's Date: 10/13/2022  PCP: [name], MD  REFERRING PROVIDER: Heywood Iles, MD  END OF SESSION:  End of Session - 10/13/22 2351     Visit Number 1    Number of Visits 25    Date for SLP Re-Evaluation 01/11/23    SLP Start Time 0804    SLP Stop Time  0849    SLP Time Calculation (min) 45 min    Activity Tolerance Patient limited by lethargy             Past Medical History:  Diagnosis Date  . Allergy   . Anemia   . Anxiety   . Asthma    in past, no inhalers now  . Blood transfusion without reported diagnosis   . Breast cancer ([place]) 01/2017   left breast   . Breast cancer of upper-inner quadrant of left female breast ([place]) 03/11/2017  . Cancer ([place]) 01/2017   left breast  . Cataract    bilateral removed  . Chronic kidney disease    kidney stones  . Depression 08/31/2013  . Dyslipidemia, goal LDL below 160 08/31/2013  . Essential hypertension - well controlled 08/31/2013  . Family history of breast cancer   . Family history of melanoma   . Family history of prostate cancer   . GERD (gastroesophageal reflux disease)   . Goals of care, counseling/discussion 03/11/2017  . History of radiation therapy 10/13/17-11/11/17   left breast 40.05 Gy in 15 fractions, left breast boost 10 Gy in 5 fractions  . Hypothyroidism   . Ischemic stroke [facility])    May 2023;  . Obesity (BMI 30-39.9) 08/31/2013  . Personal history of chemotherapy 2018  . Personal history of radiation therapy 2019  . Thyroid disease   . Vasovagal near-syncope -rare 08/31/2013   Past Surgical History:  Procedure Laterality Date  . ABDOMINAL HYSTERECTOMY     total  . BREAST BIOPSY Left 02/24/2017    malignant  . BREAST LUMPECTOMY Left 03/23/2017  . broken fingers    . CATARACT EXTRACTION Bilateral   . COLONOSCOPY    . COLONOSCOPY WITH PROPOFOL N/A  06/03/2022   Procedure: COLONOSCOPY WITH PROPOFOL;  Surgeon: [name], MD;  [facility];  Service: Gastroenterology;  Laterality: N/A;  . ESOPHAGOGASTRODUODENOSCOPY (EGD) WITH PROPOFOL N/A 05/29/2022   Procedure: ESOPHAGOGASTRODUODENOSCOPY (EGD) WITH PROPOFOL;  Surgeon: Jerene Bears, MD;  [facility];  Service: Gastroenterology;  Laterality: N/A;  . EXCISIONAL HEMORRHOIDECTOMY    . IR FLUORO GUIDE PORT INSERTION RIGHT  08/04/2017  . IR REMOVAL TUN ACCESS W/ PORT W/O FL MOD SED  08/04/2017  . IR US GUIDE VASC ACCESS RIGHT  08/04/2017  . IRRIGATION AND DEBRIDEMENT ABSCESS Left 05/18/2017   Procedure: IRRIGATION AND DEBRIDEMENT LEFT AXILLARY ABSCESS;  Surgeon: Michael Boston, MD;  [facility];  Service: General;  Laterality: Left;  . kidney stones lithotripsy    . POLYPECTOMY  06/03/2022   Procedure: POLYPECTOMY;  Surgeon: [name], MD;  [facility];  Service: Gastroenterology;;  . Sol Passer PLACEMENT Right 03/23/2017   Procedure: INSERTION PORT-A-CATH;  Surgeon: Erroll Luna, MD;  Location: [place];  Service: General;  Laterality: Right;  . RADIOACTIVE SEED GUIDED PARTIAL MASTECTOMY WITH AXILLARY SENTINEL LYMPH NODE BIOPSY Left 03/23/2017   Procedure: LEFT BREAST RADIOACTIVE SEED GUIDED PARTIAL MASTECTOMY AND  SENTINEL LYMPH NODE MAPPING;  Surgeon:  Erroll Luna, MD;  Location: [place];  Service: General;  Laterality: Left;  . ROTATOR CUFF REPAIR     left   . TONSILLECTOMY    . WISDOM TOOTH EXTRACTION     Patient Active Problem List   Diagnosis Date Noted  . Altered mental status 06/11/2022  . Acute encephalopathy 06/10/2022  . UTI (urinary tract infection) 06/10/2022  . Constipation   . History of stroke in adulthood 06/05/2022  . Heme positive stool   . Iron deficiency anemia due to chronic blood loss   . Left thalamic infarction ([place]) 03/23/2022  . Acquired hammer toe of right foot 08/19/2021   . Adverse reaction to ACE-I (angiotensin-converting enzyme inhibitor) 08/19/2021  . Allergic rhinitis 08/19/2021  . Anxiety 08/19/2021  . Asthma 08/19/2021  . Cardiomegaly 08/19/2021  . Dependence on other enabling machines and devices 08/19/2021  . History of adenomatous polyp of colon 08/19/2021  . History of pneumonia 08/19/2021  . Internal hemorrhoid 08/19/2021  . Iron deficiency anemia secondary to inadequate dietary iron intake 08/19/2021  . Osteopenia 08/19/2021  . Personal history of malignant neoplasm of breast 08/19/2021  . Somnolence 08/19/2021  . Vitamin D deficiency 08/19/2021  . 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 Insomnia 06/25/2019  . Obstructive sleep apnea 06/13/2019  . Genetic testing 06/29/2017  . Family history of melanoma   . Family history of prostate cancer   . Family history of breast cancer   . Angioedema 05/26/2017  . Left axillary seroma status post incision and drainage 05/18/2017 05/18/2017  . Cellulitis of left axilla 05/17/2017  . GERD (gastroesophageal reflux disease) 05/16/2017  . Acquired hypothyroidism 05/16/2017  . Depression with anxiety 05/16/2017  . Breast cancer of upper-inner quadrant of left female breast ([place]) 03/11/2017  . Goals of care, counseling/discussion 03/11/2017  . Obesity (BMI 30-39.9) 08/31/2013  . Essential hypertension - well controlled 08/31/2013    Class: Diagnosis of  . Depression 08/31/2013    Class: Diagnosis of  . Vasovagal near-syncope -rare 08/31/2013    Class: History of  . Dyslipidemia, goal LDL below 160 08/31/2013    ONSET DATE: May 2023   REFERRING DIAG: I63.50 (ICD-10-CM) - Cerebrovascular accident (CVA) due to occlusion of cerebral artery ([place])  THERAPY DIAG:  Cognitive communication deficit  Dysarthria and anarthria  Rationale for Evaluation and Treatment: Rehabilitation  SUBJECTIVE:   SUBJECTIVE STATEMENT: "You ask too many questions at once." "I don't like talking." Shaking head "no" when  directly asked if she wants to participate in dysarthria therapy. "I can't remember anything now." Pt accompanied by: family member - daughter [name]  PERTINENT HISTORY:  Presented 03/20/2022 with right-sided weakness slurred speech and altered mental status with right facial droop of acute onset.  CT/MRI showed small focus of acute ischemia within the left caudate body.  No hemorrhage or mass effect.  Old right basal ganglia small vessel infarct and findings of chronic microvascular disease.  Patient did not receive tPA.  CT angiogram head and neck no emergent large vessel occlusion.  Echocardiogram ejection fraction of 60 to 65% no wall motion abnormalities.  Pt was admitted to [facility] on 05/28/2022 with suspected acute lower gastrointestinal bleed complicated by acute blood loss anemia after presenting from home to [facility] ED complaining of low hemoglobin per outpatient labs.  In September 2023 Zahirah was brought by her daughters after a fall at home. She was leaning over to pick up a piece of paper and lost her balance hitting her  nose and L cheek on the ground. Unsure if she also hit her head. Did not have LOC. Not on a blood thinner. No other reports of injuries.  She rec'd HHST, which, according to daughter focused mostly on swallowing.  PAIN:  Are you having pain? No  FALLS: Has patient fallen in last 6 months?  Yes See PT eval.  LIVING ENVIRONMENT: Lives with: lives with their family Lives in: House/apartment  PLOF:  Level of assistance: Independent with ADLs Employment: Retired  PATIENT GOALS: Improve memory  OBJECTIVE:   DIAGNOSTIC FINDINGS: ***  COGNITION: Overall cognitive status: Impaired Areas of impairment:  {cognitiveimpairmentslp:27409} Functional deficits: ***  COGNITIVE COMMUNICATION: Following directions: {commands:24018}  Auditory comprehension: {WFL-Impaired:25365} Verbal expression: {WFL-Impaired:25365} Functional communication:  {WFL-Impaired:25365}  ORAL MOTOR EXAMINATION: Overall status: {OMESLP2:27645} Comments: ***  STANDARDIZED ASSESSMENTS: {SLPstandardizedassessment:27092}  PATIENT REPORTED OUTCOME MEASURES (PROM): {SLPPROM:27095}   TODAY'S TREATMENT:                                                                                                                                         DATE: ***   PATIENT EDUCATION: Education details: *** Person educated: {Person educated:25204} Education method: {Education Method:25205} Education comprehension: {Education Comprehension:25206}   GOALS: Goals reviewed with patient? {yes/no:20286}  SHORT TERM GOALS: Target date: ***  *** Baseline: Goal status: {GOALSTATUS:25110}  2.  *** Baseline:  Goal status: {GOALSTATUS:25110}  3.  *** Baseline:  Goal status: {GOALSTATUS:25110}  4.  *** Baseline:  Goal status: {GOALSTATUS:25110}  5.  *** Baseline:  Goal status: {GOALSTATUS:25110}  6.  *** Baseline:  Goal status: {GOALSTATUS:25110}  LONG TERM GOALS: Target date: ***  *** Baseline:  Goal status: {GOALSTATUS:25110}  2.  *** Baseline:  Goal status: {GOALSTATUS:25110}  3.  *** Baseline:  Goal status: {GOALSTATUS:25110}  4.  *** Baseline:  Goal status: {GOALSTATUS:25110}  5.  *** Baseline:  Goal status: {GOALSTATUS:25110}  6.  *** Baseline:  Goal status: {GOALSTATUS:25110}  ASSESSMENT:  CLINICAL IMPRESSION: Patient is a *** y.o. *** who was seen today for ***.   OBJECTIVE IMPAIRMENTS: include {SLPOBJIMP:27107}. These impairments are limiting patient from {SLPLIMIT:27108}. Factors affecting potential to achieve goals and functional outcome are {SLP factors:25450}.. Patient will benefit from skilled SLP services to address above impairments and improve overall function.  REHAB POTENTIAL: Fair given pt's motivation/response to SLP about desire for ST today during eval  PLAN:  SLP FREQUENCY: 2x/week  SLP DURATION: 12  weeks  PLANNED INTERVENTIONS: Language facilitation, Environmental controls, Cueing hierachy, Cognitive reorganization, Internal/external aids, Oral motor exercises, Functional tasks, Multimodal communication approach, SLP instruction and feedback, Compensatory strategies, and Patient/family education    [facility], [place] 10/13/2022, 11:51 PM

## 2022-10-13 NOTE — Therapy (Signed)
OUTPATIENT SPEECH LANGUAGE PATHOLOGY EVALUATION   Patient Name: Jessica [name] MRN: [id number] DOB:19-Apr-1941, 81 y.o., female Today's Date: 10/13/2022  PCP: Jessica Pretty, MD  REFERRING PROVIDER: Heywood Iles, MD  END OF SESSION:  End of Session - 10/13/22 2351     Visit Number 1    Number of Visits 25    Date for SLP Re-Evaluation 01/11/23    SLP Start Time 0804    SLP Stop Time  0849    SLP Time Calculation (min) 45 min    Activity Tolerance Patient limited by lethargy             Past Medical History:  Diagnosis Date   Allergy    Anemia    Anxiety    Asthma    in past, no inhalers now   Blood transfusion without reported diagnosis    Breast cancer ([place]) 01/2017   left breast    Breast cancer of upper-inner quadrant of left female breast ([place]) 03/11/2017   Cancer ([place]) 01/2017   left breast   Cataract    bilateral removed   Chronic kidney disease    kidney stones   Depression 08/31/2013   Dyslipidemia, goal LDL below 160 08/31/2013   Essential hypertension - well controlled 08/31/2013   Family history of breast cancer    Family history of melanoma    Family history of prostate cancer    GERD (gastroesophageal reflux disease)    Goals of care, counseling/discussion 03/11/2017   History of radiation therapy 10/13/17-11/11/17   left breast 40.05 Gy in 15 fractions, left breast boost 10 Gy in 5 fractions   Hypothyroidism    Ischemic stroke [facility])    May 2023;   Obesity (BMI 30-39.9) 08/31/2013   Personal history of chemotherapy 2018   Personal history of radiation therapy 2019   Thyroid disease    Vasovagal near-syncope -rare 08/31/2013   Past Surgical History:  Procedure Laterality Date   ABDOMINAL HYSTERECTOMY     total   BREAST BIOPSY Left 02/24/2017    malignant   BREAST LUMPECTOMY Left 03/23/2017   broken fingers     CATARACT EXTRACTION Bilateral    COLONOSCOPY     COLONOSCOPY WITH PROPOFOL N/A 06/03/2022   Procedure: COLONOSCOPY  WITH PROPOFOL;  Surgeon: Jessica November, MD;  [facility];  Service: Gastroenterology;  Laterality: N/A;   ESOPHAGOGASTRODUODENOSCOPY (EGD) WITH PROPOFOL N/A 05/29/2022   Procedure: ESOPHAGOGASTRODUODENOSCOPY (EGD) WITH PROPOFOL;  Surgeon: Jessica Bears, MD;  [facility];  Service: Gastroenterology;  Laterality: N/A;   EXCISIONAL HEMORRHOIDECTOMY     IR FLUORO GUIDE PORT INSERTION RIGHT  08/04/2017   IR REMOVAL TUN ACCESS W/ PORT W/O FL MOD SED  08/04/2017   IR US GUIDE VASC ACCESS RIGHT  08/04/2017   IRRIGATION AND DEBRIDEMENT ABSCESS Left 05/18/2017   Procedure: IRRIGATION AND DEBRIDEMENT LEFT AXILLARY ABSCESS;  Surgeon: Jessica Boston, MD;  [facility];  Service: General;  Laterality: Left;   kidney stones lithotripsy     POLYPECTOMY  06/03/2022   Procedure: POLYPECTOMY;  Surgeon: Jessica November, MD;  [facility];  Service: Gastroenterology;;   PORTACATH PLACEMENT Right 03/23/2017   Procedure: INSERTION PORT-A-CATH;  Surgeon: Jessica Luna, MD;  Location: [place];  Service: General;  Laterality: Right;   RADIOACTIVE SEED GUIDED PARTIAL MASTECTOMY WITH AXILLARY SENTINEL LYMPH NODE BIOPSY Left 03/23/2017   Procedure: LEFT BREAST RADIOACTIVE SEED GUIDED PARTIAL MASTECTOMY AND  SENTINEL LYMPH NODE [place];  Surgeon:  Jessica Luna, MD;  Location: [place];  Service: General;  Laterality: Left;   ROTATOR CUFF REPAIR     left    TONSILLECTOMY     WISDOM TOOTH EXTRACTION     Patient Active Problem List   Diagnosis Date Noted   Altered mental status 06/11/2022   Acute encephalopathy 06/10/2022   UTI (urinary tract infection) 06/10/2022   Constipation    History of stroke in adulthood 06/05/2022   Heme positive stool    Iron deficiency anemia due to chronic blood loss    Left thalamic infarction ([place]) 03/23/2022   Acquired hammer toe of right foot 08/19/2021   Adverse reaction to ACE-I (angiotensin-converting enzyme  inhibitor) 08/19/2021   Allergic rhinitis 08/19/2021   Anxiety 08/19/2021   Asthma 08/19/2021   Cardiomegaly 08/19/2021   Dependence on other enabling machines and devices 08/19/2021   History of adenomatous polyp of colon 08/19/2021   History of pneumonia 08/19/2021   Internal hemorrhoid 08/19/2021   Iron deficiency anemia secondary to inadequate dietary iron intake 08/19/2021   Osteopenia 08/19/2021   Personal history of malignant neoplasm of breast 08/19/2021   Somnolence 08/19/2021   Vitamin D deficiency 08/19/2021   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   Insomnia 06/25/2019   Obstructive sleep apnea 06/13/2019   Genetic testing 06/29/2017   Family history of melanoma    Family history of prostate cancer    Family history of breast cancer    Angioedema 05/26/2017   Left axillary seroma status post incision and drainage 05/18/2017 05/18/2017   Cellulitis of left axilla 05/17/2017   GERD (gastroesophageal reflux disease) 05/16/2017   Acquired hypothyroidism 05/16/2017   Depression with anxiety 05/16/2017   Breast cancer of upper-inner quadrant of left female breast ([place]) 03/11/2017   Goals of care, counseling/discussion 03/11/2017   Obesity (BMI 30-39.9) 08/31/2013   Essential hypertension - well controlled 08/31/2013    Class: Diagnosis of   Depression 08/31/2013    Class: Diagnosis of   Vasovagal near-syncope -rare 08/31/2013    Class: History of   Dyslipidemia, goal LDL below 160 08/31/2013    ONSET DATE: May 2023   REFERRING DIAG: I63.50 (ICD-10-CM) - Cerebrovascular accident (CVA) due to occlusion of cerebral artery ([place])  THERAPY DIAG:  Cognitive communication deficit  Dysarthria and anarthria  Rationale for Evaluation and Treatment: Rehabilitation  SUBJECTIVE:   SUBJECTIVE STATEMENT: "You ask too many questions at once." "I don't like talking." Shaking head "no" when directly asked if she wants to participate in dysarthria therapy. "I can't remember anything  now." Pt accompanied by: family member - daughter [name]  PERTINENT HISTORY:  Presented 03/20/2022 with right-sided weakness slurred speech and altered mental status with right facial droop of acute onset.  CT/MRI showed small focus of acute ischemia within the left caudate body.  No hemorrhage or mass effect.  Old right basal ganglia small vessel infarct and findings of chronic microvascular disease.  Patient did not receive tPA.  CT angiogram head and neck no emergent large vessel occlusion.  Echocardiogram ejection fraction of 60 to 65% no wall motion abnormalities.  Pt [facility] with suspected acute lower gastrointestinal bleed complicated by acute blood loss anemia after presenting from home to [facility] ED complaining of low hemoglobin per outpatient labs.  In September 2023 Drianna was brought by her daughters after a fall at home. She was leaning over to pick up a piece of paper and lost her balance hitting her  nose and L cheek on the ground. Unsure if she also hit her head. Did not have LOC. Not on a blood thinner. No other reports of injuries.  She rec'd HHST, which, according to daughter focused mostly on swallowing.  PAIN:  Are you having pain? No  FALLS: Has patient fallen in last 6 months?  Yes See PT eval.  LIVING ENVIRONMENT: Lives with: lives with their family Lives in: House/apartment  PLOF:  Level of assistance: Independent with ADLs Employment: Retired  PATIENT GOALS: Improve memory  OBJECTIVE:   DIAGNOSTIC FINDINGS:  07/07/22 - IMPRESSION: 1. No acute intracranial abnormality. No skull fracture. 2. Stable atrophy, chronic small vessel ischemia, and remote bilateral basal gangliar lacunar infarcts.  COGNITION: Overall cognitive status: Impaired Areas of impairment:  Attention: Impaired:  Memory: Impaired: Short term, per pt report Behavior: Decr'd tolerance and extremely flat affect; when asked pt indicated she did not desire any  therapy for dysarthria however pt is in need of speech therapy for incr'd volume. Possible decr'd awareness.  Functional deficits: Pt does not track meds nor appointments.   COGNITIVE COMMUNICATION: Following directions: Follows one step commands with increased time  Auditory comprehension: Impaired: pt did not answer all SLP questions appropriately  Verbal expression: WFL Functional communication: Impaired: mainly due, today, to pt's decr'd speech volume. Flat affect may also be due to pt's demeanor today re: not wishing any dysarthria/ST.  ORAL MOTOR EXAMINATION: Overall status: Impaired:   Labial: Right (Strength) Lingual: Right (Strength) Facial: Right (Strength) Comments: pt with mild dysarthria due to misarticulations. Spontaneous speech was never louder than upper 50s dB. Pt stated she no longer likes to talk because people tell her they cannot hear her.  STANDARDIZED ASSESSMENTS: [facility] Verbal Learning Test - pt scored below WNL on first two repetitions of recall subtest and WNL on the last. On Recognition portion pt scored below/outside WNL suggesting attention is a factor in pt's decr'd memory.  PATIENT REPORTED OUTCOME MEASURES (PROM): Cognitive Function:   and Communication Participation Item Bank: to be provided in first 1-2 sessions   TODAY'S TREATMENT:                                                                                                                                         DATE:  10/13/22: SLP discussed findings so far in evaluation. SLP will need to perform further assessment once rapport is developed with pt as she did not freely offer much information today SLP suspects due to reason that she does not desire tx for dysarthria. During evaluation pt stated she has trouble with memory and so SLP assessed memory. SLP hopes to eventually, as pt warms to him and ST in general, target pt's soft conversational speech.  PATIENT EDUCATION: Education details: as  above in "today's treatment" Person educated: Patient and Child(ren) Education method: Explanation Education comprehension: verbalized understanding and needs further education  GOALS: Goals reviewed with patient? Yes  SHORT TERM GOALS: Target date: 11-20-22  Pt will bring memory compensation device/system to 60% of ST sessions Baseline: Goal status: INITIAL  2.  Pt will indicate knowledge she can use memory system for functional tasks (I.e., will open/acess system when asked her therapy schedule) x 3 sessions Baseline:  Goal status: INITIAL  3.  Undergo more in depth cognitive communication testing in first 4 sessions Baseline:  Goal status: INITIAL  4.  Sylena will indicate she has told daughter to administer her meds for 7 consecutive days Baseline:  Goal status: INITIAL  5.  TBD PRN Baseline:  Goal status: INITIAL   LONG TERM GOALS: Target date: 01/10/23  Thersa will track appointments and other social events using compensations in 5 sessions Baseline:  Goal status: INITIAL  2.  Britini will indicate she has administered her meds correctly with SBA for 7 consecutive days Baseline:  Goal status: INITIAL  3.  Lajean will indicate she feels more confident talking than initial sessions by improved PRM score/s Baseline:  Goal status: INITIAL  4.  TBD PRN Baseline:  Goal status: INITIAL   ASSESSMENT:  CLINICAL IMPRESSION: Patient is a 81 y.o. female who was seen today for assessment of memory in language. Pt was largely unwilling today to do any other assessment, however SLP is hopeful as rapport is developed in the next 1-2 weeks that pt will indicate she is alright with furhter assessment by SLP. Voice volume is very low currently, at low to mid 50s dB (WNL low 70s dB). SLP only ID'd mild dysarthria today however pt's low speech volume an dlimited verbal output today made that assessment very challenging.   OBJECTIVE IMPAIRMENTS: include attention, memory,  aphasia, and dysarthria. These impairments are limiting patient from managing medications, managing appointments, managing finances, household responsibilities, ADLs/IADLs, and effectively communicating at home and in community. Factors affecting potential to achieve goals and functional outcome are ability to learn/carryover information and cooperation/participation level.. Patient will benefit from skilled SLP services to address above impairments and improve overall function.  REHAB POTENTIAL: Fair given pt's motivation/response to SLP about desire for ST today during eval  PLAN:  SLP FREQUENCY: 2x/week  SLP DURATION: 12 weeks  PLANNED INTERVENTIONS: Language facilitation, Environmental controls, Cueing hierachy, Cognitive reorganization, Internal/external aids, Oral motor exercises, Functional tasks, Multimodal communication approach, SLP instruction and feedback, Compensatory strategies, and Patient/family education    [facility], [place] 10/13/2022, 11:51 PM

## 2022-10-16 ENCOUNTER — Ambulatory Visit: Payer: Medicare Other

## 2022-10-16 ENCOUNTER — Ambulatory Visit: Payer: Medicare Other | Admitting: Occupational Therapy

## 2022-10-16 DIAGNOSIS — I69354 Hemiplegia and hemiparesis following cerebral infarction affecting left non-dominant side: Secondary | ICD-10-CM

## 2022-10-16 DIAGNOSIS — R2681 Unsteadiness on feet: Secondary | ICD-10-CM

## 2022-10-16 DIAGNOSIS — R2689 Other abnormalities of gait and mobility: Secondary | ICD-10-CM

## 2022-10-16 DIAGNOSIS — R293 Abnormal posture: Secondary | ICD-10-CM | POA: Diagnosis not present

## 2022-10-16 DIAGNOSIS — M6281 Muscle weakness (generalized): Secondary | ICD-10-CM | POA: Diagnosis not present

## 2022-10-16 DIAGNOSIS — R41841 Cognitive communication deficit: Secondary | ICD-10-CM

## 2022-10-16 DIAGNOSIS — G8929 Other chronic pain: Secondary | ICD-10-CM | POA: Diagnosis not present

## 2022-10-16 DIAGNOSIS — R471 Dysarthria and anarthria: Secondary | ICD-10-CM

## 2022-10-16 DIAGNOSIS — R4701 Aphasia: Secondary | ICD-10-CM

## 2022-10-16 DIAGNOSIS — M25511 Pain in right shoulder: Secondary | ICD-10-CM | POA: Diagnosis not present

## 2022-10-16 NOTE — Therapy (Signed)
OUTPATIENT SPEECH LANGUAGE PATHOLOGY TREATMENT   Patient Name: Jessica [name] MRN: [id number] DOB:November 14, 1940, 81 y.o., female Today's Date: 10/16/2022  PCP: [name], MD  REFERRING PROVIDER: Heywood Iles, MD  END OF SESSION:  End of Session - 10/16/22 1219     Visit Number 2    Number of Visits 25    Date for SLP Re-Evaluation 01/11/23    SLP Start Time 0933    SLP Stop Time  6606    SLP Time Calculation (min) 42 min    Activity Tolerance Patient limited by lethargy              Past Medical History:  Diagnosis Date   Allergy    Anemia    Anxiety    Asthma    in past, no inhalers now   Blood transfusion without reported diagnosis    Breast cancer ([place]) 01/2017   left breast    Breast cancer of upper-inner quadrant of left female breast ([place]) 03/11/2017   Cancer ([place]) 01/2017   left breast   Cataract    bilateral removed   Chronic kidney disease    kidney stones   Depression 08/31/2013   Dyslipidemia, goal LDL below 160 08/31/2013   Essential hypertension - well controlled 08/31/2013   Family history of breast cancer    Family history of melanoma    Family history of prostate cancer    GERD (gastroesophageal reflux disease)    Goals of care, counseling/discussion 03/11/2017   History of radiation therapy 10/13/17-11/11/17   left breast 40.05 Gy in 15 fractions, left breast boost 10 Gy in 5 fractions   Hypothyroidism    Ischemic stroke [facility])    May 2023;   Obesity (BMI 30-39.9) 08/31/2013   Personal history of chemotherapy 2018   Personal history of radiation therapy 2019   Thyroid disease    Vasovagal near-syncope -rare 08/31/2013   Past Surgical History:  Procedure Laterality Date   ABDOMINAL HYSTERECTOMY     total   BREAST BIOPSY Left 02/24/2017    malignant   BREAST LUMPECTOMY Left 03/23/2017   broken fingers     CATARACT EXTRACTION Bilateral    COLONOSCOPY     COLONOSCOPY WITH PROPOFOL N/A 06/03/2022   Procedure: COLONOSCOPY  WITH PROPOFOL;  Surgeon: [name], MD;  Location: [facility];  Service: Gastroenterology;  Laterality: N/A;   ESOPHAGOGASTRODUODENOSCOPY (EGD) WITH PROPOFOL N/A 05/29/2022   Procedure: ESOPHAGOGASTRODUODENOSCOPY (EGD) WITH PROPOFOL;  Surgeon: Jerene Bears, MD;  Location: [facility];  Service: Gastroenterology;  Laterality: N/A;   EXCISIONAL HEMORRHOIDECTOMY     IR FLUORO GUIDE PORT INSERTION RIGHT  08/04/2017   IR REMOVAL TUN ACCESS W/ PORT W/O FL MOD SED  08/04/2017   IR US GUIDE VASC ACCESS RIGHT  08/04/2017   IRRIGATION AND DEBRIDEMENT ABSCESS Left 05/18/2017   Procedure: IRRIGATION AND DEBRIDEMENT LEFT AXILLARY ABSCESS;  Surgeon: Michael Boston, MD;  Location: [facility];  Service: General;  Laterality: Left;   kidney stones lithotripsy     POLYPECTOMY  06/03/2022   Procedure: POLYPECTOMY;  Surgeon: [name], MD;  Location: [facility];  Service: Gastroenterology;;   PORTACATH PLACEMENT Right 03/23/2017   Procedure: INSERTION PORT-A-CATH;  Surgeon: Erroll Luna, MD;  Location: [place];  Service: General;  Laterality: Right;   RADIOACTIVE SEED GUIDED PARTIAL MASTECTOMY WITH AXILLARY SENTINEL LYMPH NODE BIOPSY Left 03/23/2017   Procedure: LEFT BREAST RADIOACTIVE SEED GUIDED PARTIAL MASTECTOMY AND  SENTINEL LYMPH NODE [place];  Surgeon: Erroll Luna, MD;  Location: [place];  Service: General;  Laterality: Left;   ROTATOR CUFF REPAIR     left    TONSILLECTOMY     WISDOM TOOTH EXTRACTION     Patient Active Problem List   Diagnosis Date Noted   Altered mental status 06/11/2022   Acute encephalopathy 06/10/2022   UTI (urinary tract infection) 06/10/2022   Constipation    History of stroke in adulthood 06/05/2022   Heme positive stool    Iron deficiency anemia due to chronic blood loss    Left thalamic infarction ([place]) 03/23/2022   Acquired hammer toe of right foot 08/19/2021   Adverse reaction to ACE-I (angiotensin-converting enzyme  inhibitor) 08/19/2021   Allergic rhinitis 08/19/2021   Anxiety 08/19/2021   Asthma 08/19/2021   Cardiomegaly 08/19/2021   Dependence on other enabling machines and devices 08/19/2021   History of adenomatous polyp of colon 08/19/2021   History of pneumonia 08/19/2021   Internal hemorrhoid 08/19/2021   Iron deficiency anemia secondary to inadequate dietary iron intake 08/19/2021   Osteopenia 08/19/2021   Personal history of malignant neoplasm of breast 08/19/2021   Somnolence 08/19/2021   Vitamin D deficiency 08/19/2021   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   Insomnia 06/25/2019   Obstructive sleep apnea 06/13/2019   Genetic testing 06/29/2017   Family history of melanoma    Family history of prostate cancer    Family history of breast cancer    Angioedema 05/26/2017   Left axillary seroma status post incision and drainage 05/18/2017 05/18/2017   Cellulitis of left axilla 05/17/2017   GERD (gastroesophageal reflux disease) 05/16/2017   Acquired hypothyroidism 05/16/2017   Depression with anxiety 05/16/2017   Breast cancer of upper-inner quadrant of left female breast ([place]) 03/11/2017   Goals of care, counseling/discussion 03/11/2017   Obesity (BMI 30-39.9) 08/31/2013   Essential hypertension - well controlled 08/31/2013    Class: Diagnosis of   Depression 08/31/2013    Class: Diagnosis of   Vasovagal near-syncope -rare 08/31/2013    Class: History of   Dyslipidemia, goal LDL below 160 08/31/2013    ONSET DATE: May 2023   REFERRING DIAG: I63.50 (ICD-10-CM) - Cerebrovascular accident (CVA) due to occlusion of cerebral artery ([place])  THERAPY DIAG:  Cognitive communication deficit  Dysarthria and anarthria  Aphasia  Rationale for Evaluation and Treatment: Rehabilitation  SUBJECTIVE:   SUBJECTIVE STATEMENT: Pt very flat affect today. Pt accompanied by: family member - daughter Jessica [name]  PERTINENT HISTORY:  Presented 03/20/2022 with right-sided weakness slurred speech and  altered mental status with right facial droop of acute onset.  CT/MRI showed small focus of acute ischemia within the left caudate body.  No hemorrhage or mass effect.  Old right basal ganglia small vessel infarct and findings of chronic microvascular disease.  Patient did not receive tPA.  CT angiogram head and neck no emergent large vessel occlusion.  Echocardiogram ejection fraction of 60 to 65% no wall motion abnormalities.  Pt was admitted to [facility] on 05/28/2022 with suspected acute lower gastrointestinal bleed complicated by acute blood loss anemia after presenting from home to [facility] ED complaining of low hemoglobin per outpatient labs.  In September 2023 Clois was brought by her daughters after a fall at home. She was leaning over to pick up a piece of paper and lost her balance hitting her nose and L cheek on the ground. Unsure if she also hit her head. Did not have LOC. Not on a blood  thinner. No other reports of injuries.  She rec'd HHST, which, according to daughter focused mostly on swallowing.  PAIN:  Are you having pain? No  FALLS: Has patient fallen in last 6 months?  Yes See PT eval.  LIVING ENVIRONMENT: Lives with: lives with their family Lives in: House/apartment  PLOF:  Level of assistance: Independent with ADLs Employment: Retired  PATIENT GOALS: Improve memory  OBJECTIVE:   DIAGNOSTIC FINDINGS:  07/07/22 - IMPRESSION: 1. No acute intracranial abnormality. No skull fracture. 2. Stable atrophy, chronic small vessel ischemia, and remote bilateral basal gangliar lacunar infarcts.  PATIENT REPORTED OUTCOME MEASURES (PROM): Cognitive Function:   and Communication Participation Item Bank: to be provided in first 1-2 sessions   TODAY'S TREATMENT:                                                                                                                                         DATE:  10/16/22: Will discuss memory compensations further next session.  Discussed pt's medication and timing with pt today. Brandey told SLP incorrect schedule apart from mealtimes and bedtime. Jessica [name] assisted. SLP suggested table tent for med reminder to tell Judeen Hammans it was time for medication administration. Pt shared she does not like taste of Potassium and feeds it to the dog. SLP assisted problem solving with pt and daughter about how to achieve goal of taking Potassium and also having alternative to taste (brand with heavier coating). SLP suggested calendar for pt to view appointments - dtr to buy calendar and put appointments on it.  10/13/22: SLP discussed findings so far in evaluation. SLP will need to perform further assessment once rapport is developed with pt as she did not freely offer much information today SLP suspects due to reason that she does not desire tx for dysarthria. During evaluation pt stated she has trouble with memory and so SLP assessed memory. SLP hopes to eventually, as pt warms to him and ST in general, target pt's soft conversational speech.  PATIENT EDUCATION: Education details: as above in "today's treatment" Person educated: Patient and Child(ren) Education method: Explanation Education comprehension: verbalized understanding and needs further education   GOALS: Goals reviewed with patient? Yes  SHORT TERM GOALS: Target date: 11-20-22  Pt will bring memory compensation device/system to 60% of ST sessions Baseline: Goal status: Ongoing  2.  Pt will indicate knowledge she can use memory system for functional tasks (I.e., will open/acess system when asked her therapy schedule) x 3 sessions Baseline:  Goal status: Ongoing  3.  Undergo more in depth cognitive communication testing in first 4 sessions Baseline:  Goal status: Ongoing  4.  Jamyla will indicate she has told daughter to administer her meds for 7 consecutive days Baseline:  Goal status: Ongoing  5.  TBD PRN Baseline:  Goal status: Initial   LONG TERM GOALS:  Target date: 01/10/23  Riannon will track appointments and  other social events using compensations in 5 sessions Baseline:  Goal status: Ongoing  2.  Sura will indicate she has administered her meds correctly with SBA for 7 consecutive days Baseline:  Goal status: Ongoing  3.  Ronit will indicate she feels more confident talking than initial sessions by improved PRM score/s Baseline:  Goal status: Ongoing  4.  TBD PRN Baseline:  Goal status: INITIAL   ASSESSMENT:  CLINICAL IMPRESSION: Patient is a 81 y.o. female who was seen today for treatment of memory in language. Discussion on memory compensations initiated today. Voice volume is very low currently, at low to mid 50s dB (WNL low 70s dB). SLP only ID'd mild dysarthria today however pt's low speech volume an dlimited verbal output today made that assessment very challenging.   OBJECTIVE IMPAIRMENTS: include attention, memory, aphasia, and dysarthria. These impairments are limiting patient from managing medications, managing appointments, managing finances, household responsibilities, ADLs/IADLs, and effectively communicating at home and in community. Factors affecting potential to achieve goals and functional outcome are ability to learn/carryover information and cooperation/participation level.. Patient will benefit from skilled SLP services to address above impairments and improve overall function.  REHAB POTENTIAL: Fair given pt's motivation/response to SLP about desire for ST today during eval  PLAN:  SLP FREQUENCY: 2x/week  SLP DURATION: 12 weeks  PLANNED INTERVENTIONS: Language facilitation, Environmental controls, Cueing hierachy, Cognitive reorganization, Internal/external aids, Oral motor exercises, Functional tasks, Multimodal communication approach, SLP instruction and feedback, Compensatory strategies, and Patient/family education    [facility] [place], [place] 10/16/2022, 12:19 PM

## 2022-10-16 NOTE — Therapy (Signed)
OUTPATIENT OCCUPATIONAL THERAPY  Treatment Note  Patient Name: Jessica [name] MRN: [id number] DOB:1940/11/03, 81 y.o., female Today's Date: 10/16/2022  PCP: [name], MD REFERRING PROVIDER: Izora Ribas, MD  END OF SESSION:  OT End of Session - 10/16/22 0805     Visit Number 2    Number of Visits 17    Date for OT Re-Evaluation 12/11/22    Authorization Type [employer] Medicare    OT Start Time 0804    OT Stop Time 0846    OT Time Calculation (min) 42 min              Past Medical History:  Diagnosis Date   Allergy    Anemia    Anxiety    Asthma    in past, no inhalers now   Blood transfusion without reported diagnosis    Breast cancer ([place]) 01/2017   left breast    Breast cancer of upper-inner quadrant of left female breast ([place]) 03/11/2017   Cancer ([place]) 01/2017   left breast   Cataract    bilateral removed   Chronic kidney disease    kidney stones   Depression 08/31/2013   Dyslipidemia, goal LDL below 160 08/31/2013   Essential hypertension - well controlled 08/31/2013   Family history of breast cancer    Family history of melanoma    Family history of prostate cancer    GERD (gastroesophageal reflux disease)    Goals of care, counseling/discussion 03/11/2017   History of radiation therapy 10/13/17-11/11/17   left breast 40.05 Gy in 15 fractions, left breast boost 10 Gy in 5 fractions   Hypothyroidism    Ischemic stroke [facility])    May 2023;   Obesity (BMI 30-39.9) 08/31/2013   Personal history of chemotherapy 2018   Personal history of radiation therapy 2019   Thyroid disease    Vasovagal near-syncope -rare 08/31/2013   Past Surgical History:  Procedure Laterality Date   ABDOMINAL HYSTERECTOMY     total   BREAST BIOPSY Left 02/24/2017    malignant   BREAST LUMPECTOMY Left 03/23/2017   broken fingers     CATARACT EXTRACTION Bilateral    COLONOSCOPY     COLONOSCOPY WITH PROPOFOL N/A 06/03/2022   Procedure: COLONOSCOPY WITH PROPOFOL;   Surgeon: [name], MD;  Location: [facility];  Service: Gastroenterology;  Laterality: N/A;   ESOPHAGOGASTRODUODENOSCOPY (EGD) WITH PROPOFOL N/A 05/29/2022   Procedure: ESOPHAGOGASTRODUODENOSCOPY (EGD) WITH PROPOFOL;  Surgeon: Jerene Bears, MD;  Location: [facility];  Service: Gastroenterology;  Laterality: N/A;   EXCISIONAL HEMORRHOIDECTOMY     IR FLUORO GUIDE PORT INSERTION RIGHT  08/04/2017   IR REMOVAL TUN ACCESS W/ PORT W/O FL MOD SED  08/04/2017   IR US GUIDE VASC ACCESS RIGHT  08/04/2017   IRRIGATION AND DEBRIDEMENT ABSCESS Left 05/18/2017   Procedure: IRRIGATION AND DEBRIDEMENT LEFT AXILLARY ABSCESS;  Surgeon: Michael Boston, MD;  Location: [facility];  Service: General;  Laterality: Left;   kidney stones lithotripsy     POLYPECTOMY  06/03/2022   Procedure: POLYPECTOMY;  Surgeon: [name], MD;  Location: [facility];  Service: Gastroenterology;;   PORTACATH PLACEMENT Right 03/23/2017   Procedure: INSERTION PORT-A-CATH;  Surgeon: Erroll Luna, MD;  Location: [place];  Service: General;  Laterality: Right;   RADIOACTIVE SEED GUIDED PARTIAL MASTECTOMY WITH AXILLARY SENTINEL LYMPH NODE BIOPSY Left 03/23/2017   Procedure: LEFT BREAST RADIOACTIVE SEED GUIDED PARTIAL MASTECTOMY AND  SENTINEL LYMPH NODE MAPPING;  Surgeon: Erroll Luna,  MD;  Location: [place];  Service: General;  Laterality: Left;   ROTATOR CUFF REPAIR     left    TONSILLECTOMY     WISDOM TOOTH EXTRACTION     Patient Active Problem List   Diagnosis Date Noted   Altered mental status 06/11/2022   Acute encephalopathy 06/10/2022   UTI (urinary tract infection) 06/10/2022   Constipation    History of stroke in adulthood 06/05/2022   Heme positive stool    Iron deficiency anemia due to chronic blood loss    Left thalamic infarction ([place]) 03/23/2022   Acquired hammer toe of right foot 08/19/2021   Adverse reaction to ACE-I (angiotensin-converting enzyme inhibitor)  08/19/2021   Allergic rhinitis 08/19/2021   Anxiety 08/19/2021   Asthma 08/19/2021   Cardiomegaly 08/19/2021   Dependence on other enabling machines and devices 08/19/2021   History of adenomatous polyp of colon 08/19/2021   History of pneumonia 08/19/2021   Internal hemorrhoid 08/19/2021   Iron deficiency anemia secondary to inadequate dietary iron intake 08/19/2021   Osteopenia 08/19/2021   Personal history of malignant neoplasm of breast 08/19/2021   Somnolence 08/19/2021   Vitamin D deficiency 08/19/2021   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   Insomnia 06/25/2019   Obstructive sleep apnea 06/13/2019   Genetic testing 06/29/2017   Family history of melanoma    Family history of prostate cancer    Family history of breast cancer    Angioedema 05/26/2017   Left axillary seroma status post incision and drainage 05/18/2017 05/18/2017   Cellulitis of left axilla 05/17/2017   GERD (gastroesophageal reflux disease) 05/16/2017   Acquired hypothyroidism 05/16/2017   Depression with anxiety 05/16/2017   Breast cancer of upper-inner quadrant of left female breast ([place]) 03/11/2017   Goals of care, counseling/discussion 03/11/2017   Obesity (BMI 30-39.9) 08/31/2013   Essential hypertension - well controlled 08/31/2013    Class: Diagnosis of   Depression 08/31/2013    Class: Diagnosis of   Vasovagal near-syncope -rare 08/31/2013    Class: History of   Dyslipidemia, goal LDL below 160 08/31/2013    ONSET DATE: 03/20/22  REFERRING DIAG: I63.50 (ICD-10-CM) - Cerebrovascular accident (CVA) due to occlusion of cerebral artery  THERAPY DIAG:  Hemiplegia and hemiparesis following cerebral infarction affecting left non-dominant side (HCC)  Muscle weakness (generalized)  Unsteadiness on feet  Abnormal posture  Chronic right shoulder pain  Other abnormalities of gait and mobility  Rationale for Evaluation and Treatment: Rehabilitation  SUBJECTIVE:   SUBJECTIVE STATEMENT: "Sometimes  I have a hard time getting my hand back there (with dressing). Pt accompanied by: self and family member (daughter, [name])  PERTINENT HISTORY: Past medical history of triple negative stage IIa ductal carcinoma of the left breast cancer s/p partial mastectomy 2018, HLD, HTN, CVA May 2023 with R residual deficits and expressive aphaisa.  PRECAUTIONS: Shoulder  WEIGHT BEARING RESTRICTIONS: No  PAIN:  Are you having pain? No  FALLS: Has patient fallen in last 6 months? Yes. Number of falls 1 fall (and another near fall)  LIVING ENVIRONMENT: Lives with: lives with their family (living with daughter Judeen Hammans since [place]) Lives in: House/apartment Stairs: Yes: External: 1 high brick steps; none Has following equipment at home: Lobbyist, Environmental consultant - 2 wheeled, Environmental consultant - 4 wheeled, shower chair, and Grab bars  PLOF: Independent  PATIENT GOALS: to be like I was before  OBJECTIVE:   HAND DOMINANCE: Right  ADLs: Overall ADLs: aide assists with majority of bathing/dressing  Transfers/ambulation related to ADLs: Supervision with RW in home, Rollator in community Eating: difficulty with cutting foods, spilling food off utensil due to inconsistent tremor like movements Grooming: Supervision UB Dressing: Max assist LB Dressing: Max assist Toileting: assist to pull up pants after toileting Bathing: Mod assist Tub Shower transfers: Min - HHA with transfers in/out of tub/shower Equipment: Shower seat with back and Grab bars  IADLs: Meal Prep: will occasionally pour cereal or fix oatmeal, aide does majority of other meal tasks Medication management: [facility] prepares packages of meds as prescribed and caregiver hands them to pt to take  UPPER EXTREMITY ROM:    Active ROM Right eval Left eval  Shoulder flexion 84 (h/o shoulder injury and surgery) 95  Shoulder abduction    Shoulder adduction    Shoulder extension    Shoulder internal rotation    Shoulder external rotation    Elbow  flexion 80*   Elbow extension -10*   Wrist flexion    Wrist extension    Wrist ulnar deviation    Wrist radial deviation    Wrist pronation    Wrist supination    (Blank rows = not tested)  UPPER EXTREMITY MMT:   not tested due to pain with AROM and attempts at PROM   COORDINATION: 9 Hole Peg test: Right: 53.41 sec; Left: 58.41 sec Box and Blocks:  Right 21 blocks, Left 23 blocks   OBSERVATIONS: Pt with flat affect and somewhat withdrawn.  [name] daughter reports that pt was able to complete ADLs at higher level after leaving [facility] but has allowed aide to assist with ADLs and provide hand held assist with transitional movements.   TODAY'S TREATMENT:                                                                        10/16/22 AE: OT provided education and demonstration on use of AE, particularly reacher, with LB dressing.  However with massed practice of other strategies, no longer recommending use of reach at this time. LB dressing.  Massed practice with threading BLE and pulling pants up/down over hips with use of gait belt to simulate pants.  OT providing mod cues, demonstration, and hand over hand assist for improved R hand placement when attempting to pull "waist band" over hips and in back.  OT providing cues for hand placement and weight shifting for sit > stand during simulated LB dressing task.  CGA to close assistance during sit > stand and dynamic standing balance.  Pt requiring increased time and encouragement to continue to attempt task.   10/13/22 Engaged in education on ADL re-training with focus on modified techniques, positioning, and AE as needed to increase independence with self-care tasks.  Pt reports caregiver assists with bathing and dressing at mod-max assist level, with encouragement and explanation pt open to attempting modifications and trials to decrease burden of care and increase independence.  PATIENT EDUCATION: Education details: Educated on role and  purpose of OT as well as potential interventions and goals for therapy based on initial evaluation findings. Person educated: Patient and Child(ren) Education method: Customer service manager Education comprehension: verbalized understanding and needs further education  HOME EXERCISE PROGRAM: TBD   GOALS: Goals reviewed with patient?  Yes  SHORT TERM GOALS: Target date: 11/13/22  Pt  and caregiver will verbalize understanding of task modifications and/or potential AE needs to increase ease, safety, and independence w/ ADLs. Baseline: Goal status: IN PROGRESS  2.  Pt will complete toilet transfer with supervision and no verbal cues 5 out of 6 attempts to demonstrate improved mobility and progress to decreasing level of supervision. Baseline: currently CGA to min assist Goal status: IN PROGRESS  3.  Pt will be able to manage clothing up/down for toileting with distant supervision to progress to increased independence with toileting tasks. Baseline: currently min-mod assist Goal status: IN PROGRESS  4.  Pt will be able to complete UB dressing with supervision assist (with exception of bra). Baseline: currently max assist Goal status: IN PROGRESS  LONG TERM GOALS: Target date: 12/11/22  Pt will be able to complete LB dressing with supervision, to include donning socks and shoes. Baseline: currently max assist Goal status: IN PROGRESS  2.  Pt will be able to don bra at Mod I level. Baseline: unable to complete Goal status: IN PROGRESS  3.  Pt will be able to complete all aspects of toileting (transfer, clothing management, and hygiene) at Mod I level with LRAD. Baseline: Min-mod assist Goal status: IN PROGRESS  4.  Pt will be able to maintain dynamic standing balance for 10 mins w/o LOB using DME and/or countertop support prn to increase ease, safety, and independence with engagement in meal prep tasks. Baseline:  Goal status: IN PROGRESS  5.  Patient will demonstrate  sufficient range of motion and activity tolerance to use BUE to shampoo her hair in the shower  Baseline: RUE 84* and LUE 95* shoulder ROM Goal status: IN PROGRESS   ASSESSMENT:  CLINICAL IMPRESSION: Pt responding well to verbal cues and encouragement with attempting LB dressing.  Pt requires more than reasonable amount of time and looks for assistance and/or reassurance, however with increased time and encouragement was able to complete all aspects of simulated LB dressing tasks.  Pt will benefit from continued practice at home, and encouraged to complete clothing management at least 1x/day to increase functional carryover.  PERFORMANCE DEFICITS: in functional skills including ADLs, IADLs, coordination, ROM, strength, pain, flexibility, Gross motor control, balance, body mechanics, endurance, decreased knowledge of precautions, decreased knowledge of use of DME, and UE functional use, cognitive skills including emotional, energy/drive, problem solving, safety awareness, and sequencing, and psychosocial skills including coping strategies, environmental adaptation, habits, and routines and behaviors.   IMPAIRMENTS: are limiting patient from ADLs and IADLs.   CO-MORBIDITIES: may have co-morbidities  that affects occupational performance. Patient will benefit from skilled OT to address above impairments and improve overall function.  MODIFICATION OR ASSISTANCE TO COMPLETE EVALUATION: Min-Moderate modification of tasks or assist with assess necessary to complete an evaluation.  OT OCCUPATIONAL PROFILE AND HISTORY: Problem focused assessment: Including review of records relating to presenting problem.  CLINICAL DECISION MAKING: Moderate - several treatment options, min-mod task modification necessary  REHAB POTENTIAL: Good  EVALUATION COMPLEXITY: Moderate    PLAN:  OT FREQUENCY: 2x/week  OT DURATION: 8 weeks  PLANNED INTERVENTIONS: self care/ADL training, therapeutic exercise,  therapeutic activity, neuromuscular re-education, manual therapy, passive range of motion, balance training, functional mobility training, ultrasound, compression bandaging, moist heat, cryotherapy, cognitive remediation/compensation, psychosocial skills training, energy conservation, coping strategies training, and DME and/or AE instructions  RECOMMENDED OTHER SERVICES: NA  CONSULTED AND AGREED WITH PLAN OF CARE: Patient and family member/caregiver  PLAN FOR NEXT  SESSION: Reiterate sit > stand and pulling pants up/down, short distance ambulatory transfers as needed to increase independence with toileting.   Simonne Come, OTR/L 10/16/2022, 8:41 AM

## 2022-10-16 NOTE — Patient Instructions (Signed)

## 2022-10-16 NOTE — Therapy (Signed)
OUTPATIENT PHYSICAL THERAPY NEURO TREATMENT   Patient Name: Jessica [name] MRN: [id number] DOB:08-06-1941, 81 y.o., female Today's Date: 10/16/2022   PCP: [name], MD REFERRING PROVIDER: Izora Ribas, MD  END OF SESSION:  PT End of Session - 10/16/22 0840     Visit Number 2    Number of Visits 17    Date for PT Re-Evaluation 12/11/22    Authorization Type [employer] Medicare    PT Start Time 0845    PT Stop Time 0930    PT Time Calculation (min) 45 min    Activity Tolerance Patient limited by lethargy    Behavior During Therapy Flat affect             Past Medical History:  Diagnosis Date   Allergy    Anemia    Anxiety    Asthma    in past, no inhalers now   Blood transfusion without reported diagnosis    Breast cancer ([place]) 01/2017   left breast    Breast cancer of upper-inner quadrant of left female breast ([place]) 03/11/2017   Cancer ([place]) 01/2017   left breast   Cataract    bilateral removed   Chronic kidney disease    kidney stones   Depression 08/31/2013   Dyslipidemia, goal LDL below 160 08/31/2013   Essential hypertension - well controlled 08/31/2013   Family history of breast cancer    Family history of melanoma    Family history of prostate cancer    GERD (gastroesophageal reflux disease)    Goals of care, counseling/discussion 03/11/2017   History of radiation therapy 10/13/17-11/11/17   left breast 40.05 Gy in 15 fractions, left breast boost 10 Gy in 5 fractions   Hypothyroidism    Ischemic stroke [facility])    May 2023;   Obesity (BMI 30-39.9) 08/31/2013   Personal history of chemotherapy 2018   Personal history of radiation therapy 2019   Thyroid disease    Vasovagal near-syncope -rare 08/31/2013   Past Surgical History:  Procedure Laterality Date   ABDOMINAL HYSTERECTOMY     total   BREAST BIOPSY Left 02/24/2017    malignant   BREAST LUMPECTOMY Left 03/23/2017   broken fingers     CATARACT EXTRACTION Bilateral    COLONOSCOPY      COLONOSCOPY WITH PROPOFOL N/A 06/03/2022   Procedure: COLONOSCOPY WITH PROPOFOL;  Surgeon: [name], MD;  Location: [facility];  Service: Gastroenterology;  Laterality: N/A;   ESOPHAGOGASTRODUODENOSCOPY (EGD) WITH PROPOFOL N/A 05/29/2022   Procedure: ESOPHAGOGASTRODUODENOSCOPY (EGD) WITH PROPOFOL;  Surgeon: Jerene Bears, MD;  Location: [facility];  Service: Gastroenterology;  Laterality: N/A;   EXCISIONAL HEMORRHOIDECTOMY     IR FLUORO GUIDE PORT INSERTION RIGHT  08/04/2017   IR REMOVAL TUN ACCESS W/ PORT W/O FL MOD SED  08/04/2017   IR US GUIDE VASC ACCESS RIGHT  08/04/2017   IRRIGATION AND DEBRIDEMENT ABSCESS Left 05/18/2017   Procedure: IRRIGATION AND DEBRIDEMENT LEFT AXILLARY ABSCESS;  Surgeon: Michael Boston, MD;  Location: [facility];  Service: General;  Laterality: Left;   kidney stones lithotripsy     POLYPECTOMY  06/03/2022   Procedure: POLYPECTOMY;  Surgeon: [name], MD;  Location: [facility];  Service: Gastroenterology;;   Sol Passer PLACEMENT Right 03/23/2017   Procedure: INSERTION PORT-A-CATH;  Surgeon: Erroll Luna, MD;  Location: [facility];  Service: General;  Laterality: Right;   RADIOACTIVE SEED GUIDED PARTIAL MASTECTOMY WITH AXILLARY SENTINEL LYMPH NODE BIOPSY Left 03/23/2017   Procedure:  LEFT BREAST RADIOACTIVE SEED GUIDED PARTIAL MASTECTOMY AND  SENTINEL LYMPH NODE MAPPING;  Surgeon: Erroll Luna, MD;  Location: [facility];  Service: General;  Laterality: Left;   ROTATOR CUFF REPAIR     left    TONSILLECTOMY     WISDOM TOOTH EXTRACTION     Patient Active Problem List   Diagnosis Date Noted   Altered mental status 06/11/2022   Acute encephalopathy 06/10/2022   UTI (urinary tract infection) 06/10/2022   Constipation    History of stroke in adulthood 06/05/2022   Heme positive stool    Iron deficiency anemia due to chronic blood loss    Left thalamic infarction ([place]) 03/23/2022   Acquired hammer toe of right foot  08/19/2021   Adverse reaction to ACE-I (angiotensin-converting enzyme inhibitor) 08/19/2021   Allergic rhinitis 08/19/2021   Anxiety 08/19/2021   Asthma 08/19/2021   Cardiomegaly 08/19/2021   Dependence on other enabling machines and devices 08/19/2021   History of adenomatous polyp of colon 08/19/2021   History of pneumonia 08/19/2021   Internal hemorrhoid 08/19/2021   Iron deficiency anemia secondary to inadequate dietary iron intake 08/19/2021   Osteopenia 08/19/2021   Personal history of malignant neoplasm of breast 08/19/2021   Somnolence 08/19/2021   Vitamin D deficiency 08/19/2021   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   Insomnia 06/25/2019   Obstructive sleep apnea 06/13/2019   Genetic testing 06/29/2017   Family history of melanoma    Family history of prostate cancer    Family history of breast cancer    Angioedema 05/26/2017   Left axillary seroma status post incision and drainage 05/18/2017 05/18/2017   Cellulitis of left axilla 05/17/2017   GERD (gastroesophageal reflux disease) 05/16/2017   Acquired hypothyroidism 05/16/2017   Depression with anxiety 05/16/2017   Breast cancer of upper-inner quadrant of left female breast ([place]) 03/11/2017   Goals of care, counseling/discussion 03/11/2017   Obesity (BMI 30-39.9) 08/31/2013   Essential hypertension - well controlled 08/31/2013    Class: Diagnosis of   Depression 08/31/2013    Class: Diagnosis of   Vasovagal near-syncope -rare 08/31/2013    Class: History of   Dyslipidemia, goal LDL below 160 08/31/2013    ONSET DATE: 10/05/2022  REFERRING DIAG: I63.50 (ICD-10-CM) - Cerebrovascular accident (CVA) due to occlusion of cerebral artery ([place])   THERAPY DIAG:  Hemiplegia and hemiparesis following cerebral infarction affecting left non-dominant side (HCC)  Muscle weakness (generalized)  Unsteadiness on feet  Abnormal posture  Other abnormalities of gait and mobility  Rationale for Evaluation and Treatment:  Rehabilitation  SUBJECTIVE:  SUBJECTIVE STATEMENT: No new issues  Pt accompanied by: family member, daughter   PERTINENT HISTORY: HTN, HLD, hypothyroidism, asthma, depression, anxiety, L breast cancer post-mastectomy 2018, obesity; 03/20/22 with CVA R sided weakness  PAIN:  Are you having pain? Yes: NPRS scale: 4/10 Pain location: shoulder Pain description: aching Aggravating factors: sleeping on it wrong Relieving factors: rubbing it *OT aware PRECAUTIONS: Fall  WEIGHT BEARING RESTRICTIONS: No  FALLS: Has patient fallen in last 6 months? Yes. Number of falls 1  LIVING ENVIRONMENT: Lives with: lives with their family and lives with their daughter Lives in: House/apartment Stairs: No Has following equipment at home: Environmental consultant - 2 wheeled, Environmental consultant - 4 wheeled, and shower chair  PLOF: Independent  PATIENT GOALS: To get back home, to get off the walker  OBJECTIVE:   TODAY'S TREATMENT: 10/16/22 Activity Comments  Sit-stand 3x5 reps [employer], UE assist, elevated seat height  Heel Raises with Counter Support  3x10  Side to Side Weight Shift with Overhead Reach and Counter Support  1x10, LUE along counter due to shoulder pain/tightness  Staggered Stance Forward Backward Weight Shift with Counter Support 1x10, cues for toe extension/DF  NU-step 2 min slow, large ROM 2 min 30-40 SPM--difficulty maintaining resistance level 3 2 min 40-50 with frequent cues resistance level 2        HOME EXERCISE PROGRAM Access Code: [email or web address] URL: [email or web address] Date: 10/16/2022 Prepared by: Sherlyn Lees  Exercises - Sit to Stand with Counter Support  - 1 x daily - 7 x weekly - 3 sets - 5 reps - Heel Raises with Counter Support  - 1 x daily - 7 x weekly - 3 sets - 10 reps - Side  to Side Weight Shift with Overhead Reach and Counter Support  - 1 x daily - 7 x weekly - 3 sets - 10 reps - Staggered Stance Forward Backward Weight Shift with Counter Support  - 1 x daily - 7 x weekly - 3 sets - 10 reps  DIAGNOSTIC FINDINGS: MRI 06/11/22-no changes; 03/21/22 MRI:  acute ischemia within the L caudate body; old R basal ganglia small vessel infarct  COGNITION: Overall cognitive status:  Dysarthria per notes; flat affect   SENSATION: Light touch: WFL   POSTURE: rounded shoulders and forward head  LOWER EXTREMITY ROM:   WFL for BLEs  Active  Right Eval Left Eval  Hip flexion    Hip extension    Hip abduction    Hip adduction    Hip internal rotation    Hip external rotation    Knee flexion    Knee extension    Ankle dorsiflexion    Ankle plantarflexion    Ankle inversion    Ankle eversion     (Blank rows = not tested)  LOWER EXTREMITY MMT:    MMT Right Eval Left Eval  Hip flexion 3+ 3+  Hip extension    Hip abduction 3+ 3+  Hip adduction 3 3  Hip internal rotation    Hip external rotation    Knee flexion 4 3+  Knee extension 3+ 3+  Ankle dorsiflexion 3+ 3-  Ankle plantarflexion    Ankle inversion    Ankle eversion    (Blank rows = not tested)  BED MOBILITY:  Requires assist of family, daughter reports providing 50% assistance  TRANSFERS: Assistive device utilized:  UE support to stand at locked rollator   Sit to stand: SBA with UE support Stand to sit: CGA with UE support   GAIT:  Gait pattern: step through pattern, decreased step length- Right, decreased step length- Left, and poor foot clearance- Right Distance walked: 50 ft Assistive device utilized: Walker - 4 wheeled Level of assistance: CGA Comments: 89 M walk:  20.34 sec (1.61 ft/sec)  FUNCTIONAL TESTS:  5 times sit to stand: 41.35 sec with UE support Timed up and go (TUG): 45.90 sec with 4-wheeled RW Berg Balance Scale: 24/56 10 M walk:  1.61 ft/sec  with RW  PATIENT SURVEYS:   FOTO intake score 51; predicted score at discharge 41      PATIENT EDUCATION: Education details: Eval results, POC Person educated: Patient and Child(ren) Education method: Explanation Education comprehension: verbalized understanding    GOALS: Goals reviewed with patient? Yes  SHORT TERM GOALS: Target date: 11/13/2022  Pt will be supervision with HEP for improved strength, balance, gait. Baseline: Goal status: INITIAL  2.  Pt will improve 5x sit<>stand to less than or equal to 30 sec to demonstrate improved functional strength and transfer efficiency.  Baseline: 41.35 sec Goal status: INITIAL  3.  Pt will improve Berg score to at least 30/56 to decrease fall risk. Baseline: 24/56 Goal status: INITIAL  4.  Pt will improve TUG score to less than or equal to 35 sec for decreased fall risk. Baseline: 45.9 sec Goal status: INITIAL  LONG TERM GOALS: Target date: 12/11/2022  Pt will be supervision with HEP for improved strength, balance, gait. Baseline:  Goal status: INITIAL  2.  Pt will improve 5x sit<>stand to less than or equal to 20 sec to demonstrate improved functional strength and transfer efficiency.  Baseline:  Goal status: INITIAL  3.  Pt will improve Berg score to at least 40/56 to decrease fall risk. Baseline:  Goal status: INITIAL  4.  Pt will improve TUG score to less than or equal to 20 sec for decreased fall risk. Baseline:  Goal status: INITIAL  5.  Pt will improve gait velocity to at least 2 ft/sec for improved gait efficiency and safety. Baseline: 1.61 ft/sec Goal status: INITIAL  ASSESSMENT:  CLINICAL IMPRESSION: Initiated HEP this date with emphasis on performing at kitchen counter with supervision every other day for starting with movements specific to LE strength and balance to promote safety and larger amplitude movements to promote greater step length with ambulation.  Activities modified for LUE due to hx of frozen shoulder and ongoing ROM  limits and pain with stretch. Assisted with NU-step to promote fast alternating movements with poor carryover when cues are removed (verbal/physical). Continued sessions to progress HEP development, caregiver training, and reduce risk for falls.   OBJECTIVE IMPAIRMENTS: Abnormal gait, decreased balance, decreased mobility, difficulty walking, decreased strength, and postural dysfunction.   ACTIVITY LIMITATIONS: lifting, bending, standing, squatting, transfers, bed mobility, toileting, hygiene/grooming, and locomotion level  PARTICIPATION LIMITATIONS: meal prep, cleaning, laundry, driving, shopping, and community activity  PERSONAL FACTORS: Time since onset of injury/illness/exacerbation and 3+ comorbidities: see above for PMH  are also affecting patient's functional outcome.   REHAB POTENTIAL: Good  CLINICAL DECISION MAKING: Evolving/moderate complexity  EVALUATION COMPLEXITY: Moderate  PLAN:  PT FREQUENCY: 2x/week  PT DURATION: 8 weeks plus 1x/wk (during eval week)-9 week POC  PLANNED INTERVENTIONS: Therapeutic exercises, Therapeutic activity, Neuromuscular re-education, Balance training, Gait training, Patient/Family education, and Self Care  PLAN FOR NEXT SESSION: Brief HEP review as she is supposed to complete on Sunday and current recommendation is for "every other day". Addition of large amplitude weight shift activities   9:40 AM, 10/16/22  M. Sherlyn Lees, PT, DPT Physical Therapist- [place] Office Number: [phone]   [place] [facility] [address], [place], [place] [address] Phone # [phone] Fax # [phone]

## 2022-10-19 ENCOUNTER — Encounter: Payer: Self-pay | Admitting: Physical Therapy

## 2022-10-19 ENCOUNTER — Ambulatory Visit: Payer: Medicare Other | Admitting: Physical Therapy

## 2022-10-19 ENCOUNTER — Telehealth: Payer: Self-pay

## 2022-10-19 ENCOUNTER — Ambulatory Visit: Payer: Medicare Other | Admitting: Occupational Therapy

## 2022-10-19 DIAGNOSIS — G8929 Other chronic pain: Secondary | ICD-10-CM | POA: Diagnosis not present

## 2022-10-19 DIAGNOSIS — R471 Dysarthria and anarthria: Secondary | ICD-10-CM | POA: Diagnosis not present

## 2022-10-19 DIAGNOSIS — I69354 Hemiplegia and hemiparesis following cerebral infarction affecting left non-dominant side: Secondary | ICD-10-CM | POA: Diagnosis not present

## 2022-10-19 DIAGNOSIS — R2689 Other abnormalities of gait and mobility: Secondary | ICD-10-CM | POA: Diagnosis not present

## 2022-10-19 DIAGNOSIS — R293 Abnormal posture: Secondary | ICD-10-CM

## 2022-10-19 DIAGNOSIS — R2681 Unsteadiness on feet: Secondary | ICD-10-CM

## 2022-10-19 DIAGNOSIS — M6281 Muscle weakness (generalized): Secondary | ICD-10-CM | POA: Diagnosis not present

## 2022-10-19 DIAGNOSIS — M25511 Pain in right shoulder: Secondary | ICD-10-CM | POA: Diagnosis not present

## 2022-10-19 DIAGNOSIS — R4701 Aphasia: Secondary | ICD-10-CM | POA: Diagnosis not present

## 2022-10-19 NOTE — Patient Instructions (Signed)
UE exercises:  - reaching forward with both UE while holding dowel, theraband.  Focus on extending elbows and opening up underarm area.  - using both arms (with plastic bag or medium sized towel): reach up and overhead to simulate putting shirt on over head.

## 2022-10-19 NOTE — Therapy (Signed)
OUTPATIENT OCCUPATIONAL THERAPY  Treatment Note  Patient Name: Jessica [name] MRN: [id number] DOB:[birth year], 81 y.o., female Today's Date: 10/19/2022  PCP: [name], MD REFERRING PROVIDER: Izora Ribas, MD  END OF SESSION:  OT End of Session - 10/19/22 1644     Visit Number 3    Number of Visits 17    Date for OT Re-Evaluation 12/11/22    Authorization Type [employer]    OT Start Time 27    OT Stop Time 1617    OT Time Calculation (min) 43 min               Past Medical History:  Diagnosis Date   Allergy    Anemia    Anxiety    Asthma    in past, no inhalers now   Blood transfusion without reported diagnosis    Breast cancer ([place]) 01/2017   left breast    Breast cancer of upper-inner quadrant of left female breast ([place]) 03/11/2017   Cancer ([place]) 01/2017   left breast   Cataract    bilateral removed   Chronic kidney disease    kidney stones   Depression 08/31/2013   Dyslipidemia, goal LDL below 160 08/31/2013   Essential hypertension - well controlled 08/31/2013   Family history of breast cancer    Family history of melanoma    Family history of prostate cancer    GERD (gastroesophageal reflux disease)    Goals of care, counseling/discussion 03/11/2017   History of radiation therapy 10/13/17-11/11/17   left breast 40.05 Gy in 15 fractions, left breast boost 10 Gy in 5 fractions   Hypothyroidism    Ischemic stroke [facility])    May 2023;   Obesity (BMI 30-39.9) 08/31/2013   Personal history of chemotherapy 2018   Personal history of radiation therapy 2019   Thyroid disease    Vasovagal near-syncope -rare 08/31/2013   Past Surgical History:  Procedure Laterality Date   ABDOMINAL HYSTERECTOMY     total   BREAST BIOPSY Left 02/24/2017    malignant   BREAST LUMPECTOMY Left 03/23/2017   broken fingers     CATARACT EXTRACTION Bilateral    COLONOSCOPY     COLONOSCOPY WITH PROPOFOL N/A 06/03/2022   Procedure: COLONOSCOPY WITH PROPOFOL;   Surgeon: [name], MD;  Location: [facility];  Service: Gastroenterology;  Laterality: N/A;   ESOPHAGOGASTRODUODENOSCOPY (EGD) WITH PROPOFOL N/A 05/29/2022   Procedure: ESOPHAGOGASTRODUODENOSCOPY (EGD) WITH PROPOFOL;  Surgeon: Jerene Bears, MD;  Location: [facility];  Service: Gastroenterology;  Laterality: N/A;   EXCISIONAL HEMORRHOIDECTOMY     IR FLUORO GUIDE PORT INSERTION RIGHT  08/04/2017   IR REMOVAL TUN ACCESS W/ PORT W/O FL MOD SED  08/04/2017   IR US GUIDE VASC ACCESS RIGHT  08/04/2017   IRRIGATION AND DEBRIDEMENT ABSCESS Left 05/18/2017   Procedure: IRRIGATION AND DEBRIDEMENT LEFT AXILLARY ABSCESS;  Surgeon: Michael Boston, MD;  Location: [facility];  Service: General;  Laterality: Left;   kidney stones lithotripsy     POLYPECTOMY  06/03/2022   Procedure: POLYPECTOMY;  Surgeon: [name], MD;  Location: [facility];  Service: Gastroenterology;;   PORTACATH PLACEMENT Right 03/23/2017   Procedure: INSERTION PORT-A-CATH;  Surgeon: Erroll Luna, MD;  Location: [facility];  Service: General;  Laterality: Right;   RADIOACTIVE SEED GUIDED PARTIAL MASTECTOMY WITH AXILLARY SENTINEL LYMPH NODE BIOPSY Left 03/23/2017   Procedure: LEFT BREAST RADIOACTIVE SEED GUIDED PARTIAL MASTECTOMY AND  SENTINEL LYMPH NODE [place];  Surgeon: Brantley Stage,  Marcello Moores, MD;  Location: [facility];  Service: General;  Laterality: Left;   ROTATOR CUFF REPAIR     left    TONSILLECTOMY     WISDOM TOOTH EXTRACTION     Patient Active Problem List   Diagnosis Date Noted   Altered mental status 06/11/2022   Acute encephalopathy 06/10/2022   UTI (urinary tract infection) 06/10/2022   Constipation    History of stroke in adulthood 06/05/2022   Heme positive stool    Iron deficiency anemia due to chronic blood loss    Left thalamic infarction ([place]) 03/23/2022   Acquired hammer toe of right foot 08/19/2021   Adverse reaction to ACE-I (angiotensin-converting enzyme inhibitor)  08/19/2021   Allergic rhinitis 08/19/2021   Anxiety 08/19/2021   Asthma 08/19/2021   Cardiomegaly 08/19/2021   Dependence on other enabling machines and devices 08/19/2021   History of adenomatous polyp of colon 08/19/2021   History of pneumonia 08/19/2021   Internal hemorrhoid 08/19/2021   Iron deficiency anemia secondary to inadequate dietary iron intake 08/19/2021   Osteopenia 08/19/2021   Personal history of malignant neoplasm of breast 08/19/2021   Somnolence 08/19/2021   Vitamin D deficiency 08/19/2021   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   Insomnia 06/25/2019   Obstructive sleep apnea 06/13/2019   Genetic testing 06/29/2017   Family history of melanoma    Family history of prostate cancer    Family history of breast cancer    Angioedema 05/26/2017   Left axillary seroma status post incision and drainage 05/18/2017 05/18/2017   Cellulitis of left axilla 05/17/2017   GERD (gastroesophageal reflux disease) 05/16/2017   Acquired hypothyroidism 05/16/2017   Depression with anxiety 05/16/2017   Breast cancer of upper-inner quadrant of left female breast ([place]) 03/11/2017   Goals of care, counseling/discussion 03/11/2017   Obesity (BMI 30-39.9) 08/31/2013   Essential hypertension - well controlled 08/31/2013    Class: Diagnosis of   Depression 08/31/2013    Class: Diagnosis of   Vasovagal near-syncope -rare 08/31/2013    Class: History of   Dyslipidemia, goal LDL below 160 08/31/2013    ONSET DATE: 03/20/22  REFERRING DIAG: I63.50 (ICD-10-CM) - Cerebrovascular accident (CVA) due to occlusion of cerebral artery  THERAPY DIAG:  Hemiplegia and hemiparesis following cerebral infarction affecting left non-dominant side (HCC)  Muscle weakness (generalized)  Unsteadiness on feet  Abnormal posture  Rationale for Evaluation and Treatment: Rehabilitation  SUBJECTIVE:   SUBJECTIVE STATEMENT: "Sometimes I have a hard time getting my hand back there (with dressing). Pt  accompanied by: self and family member (daughter, [name])  PERTINENT HISTORY: Past medical history of triple negative stage IIa ductal carcinoma of the left breast cancer s/p partial mastectomy 2018, HLD, HTN, CVA May 2023 with R residual deficits and expressive aphaisa.  PRECAUTIONS: Shoulder  WEIGHT BEARING RESTRICTIONS: No  PAIN:  Are you having pain? Yes: NPRS scale: 3/10 Pain location: R shoulder Pain description: sore Aggravating factors: overhead movement Relieving factors: rest  FALLS: Has patient fallen in last 6 months? Yes. Number of falls 1 fall (and another near fall)  LIVING ENVIRONMENT: Lives with: lives with their family (living with daughter Judeen Hammans since [place]) Lives in: House/apartment Stairs: Yes: External: 1 high brick steps; none Has following equipment at home: Lobbyist, Environmental consultant - 2 wheeled, Environmental consultant - 4 wheeled, shower chair, and Grab bars  PLOF: Independent  PATIENT GOALS: to be like I was before  OBJECTIVE:   HAND DOMINANCE: Right  ADLs: Overall ADLs:  aide assists with majority of bathing/dressing Transfers/ambulation related to ADLs: Supervision with RW in home, Rollator in community Eating: difficulty with cutting foods, spilling food off utensil due to inconsistent tremor like movements Grooming: Supervision UB Dressing: Max assist LB Dressing: Max assist Toileting: assist to pull up pants after toileting Bathing: Mod assist Tub Shower transfers: Min - HHA with transfers in/out of tub/shower Equipment: Shower seat with back and Grab bars  IADLs: Meal Prep: will occasionally pour cereal or fix oatmeal, aide does majority of other meal tasks Medication management: [facility] prepares packages of meds as prescribed and caregiver hands them to pt to take  UPPER EXTREMITY ROM:    Active ROM Right eval Left eval  Shoulder flexion 84 (h/o shoulder injury and surgery) 95  Shoulder abduction    Shoulder adduction    Shoulder extension     Shoulder internal rotation    Shoulder external rotation    Elbow flexion 80*   Elbow extension -10*   Wrist flexion    Wrist extension    Wrist ulnar deviation    Wrist radial deviation    Wrist pronation    Wrist supination    (Blank rows = not tested)  UPPER EXTREMITY MMT:   not tested due to pain with AROM and attempts at PROM   COORDINATION: 9 Hole Peg test: Right: 53.41 sec; Left: 58.41 sec Box and Blocks:  Right 21 blocks, Left 23 blocks   OBSERVATIONS: Pt with flat affect and somewhat withdrawn.  [name] daughter reports that pt was able to complete ADLs at higher level after leaving [facility] but has allowed aide to assist with ADLs and provide hand held assist with transitional movements.   TODAY'S TREATMENT:                                                                        10/19/22 LB dressing: massed practice with threading BLE and pulling pants up/down over hips with use of gait belt to simulate pants.  OT providing min cues and allowing increased time for pt to problem solve challenges.  Pt able to recall technique from previous session with min cues to increase reach in back.  Pt demonstrating improved ease with sit > stand throughout session. ADL: pt's caregiver present during session and reporting that she did not help her with clothing management during toileting tasks this session, but continues to provide supervision.  Pt reports that she feels that one of her daughters helped her too much in the bathroom.  Engaged in discussion about providing verbal cues to aid pt initially before physically assisting with clothing management.  Also discussed decreasing physical assistance while still providing supervision to allow pt to feel increased independence. UB dressing: engaged in bag exercise with use of BUE to advance bag over head and back to simulate donning shirt.  Pt demonstrating decreased shoulder flexion Bilaterally, L > R.  OT providing demonstration of technique  and hand placement to facilitate increased ease and ROM with UB dressing to include forward reach to allow for increased shoulder flexion.   10/16/22 AE: OT provided education and demonstration on use of AE, particularly reacher, with LB dressing.  However with massed practice of other strategies, no longer recommending  use of reach at this time. LB dressing.  Massed practice with threading BLE and pulling pants up/down over hips with use of gait belt to simulate pants.  OT providing mod cues, demonstration, and hand over hand assist for improved R hand placement when attempting to pull "waist band" over hips and in back.  OT providing cues for hand placement and weight shifting for sit > stand during simulated LB dressing task.  CGA to close assistance during sit > stand and dynamic standing balance.  Pt requiring increased time and encouragement to continue to attempt task.   10/13/22 Engaged in education on ADL re-training with focus on modified techniques, positioning, and AE as needed to increase independence with self-care tasks.  Pt reports caregiver assists with bathing and dressing at mod-max assist level, with encouragement and explanation pt open to attempting modifications and trials to decrease burden of care and increase independence.  PATIENT EDUCATION: Education details: Educated on role and purpose of OT as well as potential interventions and goals for therapy based on initial evaluation findings. Person educated: Patient and Child(ren) Education method: Customer service manager Education comprehension: verbalized understanding and needs further education  HOME EXERCISE PROGRAM: TBD   GOALS: Goals reviewed with patient? Yes  SHORT TERM GOALS: Target date: 11/13/22  Pt  and caregiver will verbalize understanding of task modifications and/or potential AE needs to increase ease, safety, and independence w/ ADLs. Baseline: Goal status: IN PROGRESS  2.  Pt will complete  toilet transfer with supervision and no verbal cues 5 out of 6 attempts to demonstrate improved mobility and progress to decreasing level of supervision. Baseline: currently CGA to min assist Goal status: IN PROGRESS  3.  Pt will be able to manage clothing up/down for toileting with distant supervision to progress to increased independence with toileting tasks. Baseline: currently min-mod assist Goal status: IN PROGRESS  4.  Pt will be able to complete UB dressing with supervision assist (with exception of bra). Baseline: currently max assist Goal status: IN PROGRESS  LONG TERM GOALS: Target date: 12/11/22  Pt will be able to complete LB dressing with supervision, to include donning socks and shoes. Baseline: currently max assist Goal status: IN PROGRESS  2.  Pt will be able to don bra at Mod I level. Baseline: unable to complete Goal status: IN PROGRESS  3.  Pt will be able to complete all aspects of toileting (transfer, clothing management, and hygiene) at Mod I level with LRAD. Baseline: Min-mod assist Goal status: IN PROGRESS  4.  Pt will be able to maintain dynamic standing balance for 10 mins w/o LOB using DME and/or countertop support prn to increase ease, safety, and independence with engagement in meal prep tasks. Baseline:  Goal status: IN PROGRESS  5.  Patient will demonstrate sufficient range of motion and activity tolerance to use BUE to shampoo her hair in the shower  Baseline: RUE 84* and LUE 95* shoulder ROM Goal status: IN PROGRESS   ASSESSMENT:  CLINICAL IMPRESSION: [name] daughter, [name], and caregiver, [name], present for session to facilitate increased carryover of participation in self-care tasks to home.  Pt responding well to verbal cues and encouragement with simulated UB and LB dressing.  Pt requires more than reasonable amount of time, however was able to complete all aspects of simulated LB dressing tasks this session with good carryover from previous  session.  Pt will benefit from continued practice at home, and encouraged to progress to completing all management of pants by  next therapy session.    PERFORMANCE DEFICITS: in functional skills including ADLs, IADLs, coordination, ROM, strength, pain, flexibility, Gross motor control, balance, body mechanics, endurance, decreased knowledge of precautions, decreased knowledge of use of DME, and UE functional use, cognitive skills including emotional, energy/drive, problem solving, safety awareness, and sequencing, and psychosocial skills including coping strategies, environmental adaptation, habits, and routines and behaviors.   IMPAIRMENTS: are limiting patient from ADLs and IADLs.   CO-MORBIDITIES: may have co-morbidities  that affects occupational performance. Patient will benefit from skilled OT to address above impairments and improve overall function.  MODIFICATION OR ASSISTANCE TO COMPLETE EVALUATION: Min-Moderate modification of tasks or assist with assess necessary to complete an evaluation.  OT OCCUPATIONAL PROFILE AND HISTORY: Problem focused assessment: Including review of records relating to presenting problem.  CLINICAL DECISION MAKING: Moderate - several treatment options, min-mod task modification necessary  REHAB POTENTIAL: Good  EVALUATION COMPLEXITY: Moderate    PLAN:  OT FREQUENCY: 2x/week  OT DURATION: 8 weeks  PLANNED INTERVENTIONS: self care/ADL training, therapeutic exercise, therapeutic activity, neuromuscular re-education, manual therapy, passive range of motion, balance training, functional mobility training, ultrasound, compression bandaging, moist heat, cryotherapy, cognitive remediation/compensation, psychosocial skills training, energy conservation, coping strategies training, and DME and/or AE instructions  RECOMMENDED OTHER SERVICES: NA  CONSULTED AND AGREED WITH PLAN OF CARE: Patient and family member/caregiver  PLAN FOR NEXT SESSION: Reiterate sit >  stand and pulling pants up/down, short distance ambulatory transfers as needed to increase independence with toileting.  UB ROM exercises for increased ease, safety, independence with UB dressing.   Simonne Come, OTR/L 10/19/2022, 4:44 PM

## 2022-10-19 NOTE — Therapy (Addendum)
OUTPATIENT PHYSICAL THERAPY NEURO TREATMENT   Patient Name: Jessica [name] MRN: [id number] DOB:1941/02/19, 81 y.o., female Today's Date: 10/19/2022   PCP: [name], MD REFERRING PROVIDER: Izora Ribas, MD  END OF SESSION:  PT End of Session - 10/19/22 1620     Visit Number 3    Number of Visits 17    Date for PT Re-Evaluation 12/11/22    Authorization Type [employer] Medicare    PT Start Time 1620    PT Stop Time 1700    PT Time Calculation (min) 40 min    Equipment Utilized During Treatment Gait belt    Activity Tolerance Patient limited by lethargy    Behavior During Therapy Flat affect             Past Medical History:  Diagnosis Date   Allergy    Anemia    Anxiety    Asthma    in past, no inhalers now   Blood transfusion without reported diagnosis    Breast cancer ([place]) 01/2017   left breast    Breast cancer of upper-inner quadrant of left female breast ([place]) 03/11/2017   Cancer ([place]) 01/2017   left breast   Cataract    bilateral removed   Chronic kidney disease    kidney stones   Depression 08/31/2013   Dyslipidemia, goal LDL below 160 08/31/2013   Essential hypertension - well controlled 08/31/2013   Family history of breast cancer    Family history of melanoma    Family history of prostate cancer    GERD (gastroesophageal reflux disease)    Goals of care, counseling/discussion 03/11/2017   History of radiation therapy 10/13/17-11/11/17   left breast 40.05 Gy in 15 fractions, left breast boost 10 Gy in 5 fractions   Hypothyroidism    Ischemic stroke [facility])    May 2023;   Obesity (BMI 30-39.9) 08/31/2013   Personal history of chemotherapy 2018   Personal history of radiation therapy 2019   Thyroid disease    Vasovagal near-syncope -rare 08/31/2013   Past Surgical History:  Procedure Laterality Date   ABDOMINAL HYSTERECTOMY     total   BREAST BIOPSY Left 02/24/2017    malignant   BREAST LUMPECTOMY Left 03/23/2017   broken fingers      CATARACT EXTRACTION Bilateral    COLONOSCOPY     COLONOSCOPY WITH PROPOFOL N/A 06/03/2022   Procedure: COLONOSCOPY WITH PROPOFOL;  Surgeon: [name], MD;  Location: [facility];  Service: Gastroenterology;  Laterality: N/A;   ESOPHAGOGASTRODUODENOSCOPY (EGD) WITH PROPOFOL N/A 05/29/2022   Procedure: ESOPHAGOGASTRODUODENOSCOPY (EGD) WITH PROPOFOL;  Surgeon: Jerene Bears, MD;  Location: [facility];  Service: Gastroenterology;  Laterality: N/A;   EXCISIONAL HEMORRHOIDECTOMY     IR FLUORO GUIDE PORT INSERTION RIGHT  08/04/2017   IR REMOVAL TUN ACCESS W/ PORT W/O FL MOD SED  08/04/2017   IR US GUIDE VASC ACCESS RIGHT  08/04/2017   IRRIGATION AND DEBRIDEMENT ABSCESS Left 05/18/2017   Procedure: IRRIGATION AND DEBRIDEMENT LEFT AXILLARY ABSCESS;  Surgeon: Michael Boston, MD;  Location: [facility];  Service: General;  Laterality: Left;   kidney stones lithotripsy     POLYPECTOMY  06/03/2022   Procedure: POLYPECTOMY;  Surgeon: [name], MD;  Location: [facility];  Service: Gastroenterology;;   Sol Passer PLACEMENT Right 03/23/2017   Procedure: INSERTION PORT-A-CATH;  Surgeon: Erroll Luna, MD;  Location: [place];  Service: General;  Laterality: Right;   RADIOACTIVE SEED GUIDED PARTIAL MASTECTOMY WITH AXILLARY  SENTINEL LYMPH NODE BIOPSY Left 03/23/2017   Procedure: LEFT BREAST RADIOACTIVE SEED GUIDED PARTIAL MASTECTOMY AND  SENTINEL LYMPH NODE MAPPING;  Surgeon: Erroll Luna, MD;  Location: [place];  Service: General;  Laterality: Left;   ROTATOR CUFF REPAIR     left    TONSILLECTOMY     WISDOM TOOTH EXTRACTION     Patient Active Problem List   Diagnosis Date Noted   Altered mental status 06/11/2022   Acute encephalopathy 06/10/2022   UTI (urinary tract infection) 06/10/2022   Constipation    History of stroke in adulthood 06/05/2022   Heme positive stool    Iron deficiency anemia due to chronic blood loss    Left thalamic infarction ([place])  03/23/2022   Acquired hammer toe of right foot 08/19/2021   Adverse reaction to ACE-I (angiotensin-converting enzyme inhibitor) 08/19/2021   Allergic rhinitis 08/19/2021   Anxiety 08/19/2021   Asthma 08/19/2021   Cardiomegaly 08/19/2021   Dependence on other enabling machines and devices 08/19/2021   History of adenomatous polyp of colon 08/19/2021   History of pneumonia 08/19/2021   Internal hemorrhoid 08/19/2021   Iron deficiency anemia secondary to inadequate dietary iron intake 08/19/2021   Osteopenia 08/19/2021   Personal history of malignant neoplasm of breast 08/19/2021   Somnolence 08/19/2021   Vitamin D deficiency 08/19/2021   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   Insomnia 06/25/2019   Obstructive sleep apnea 06/13/2019   Genetic testing 06/29/2017   Family history of melanoma    Family history of prostate cancer    Family history of breast cancer    Angioedema 05/26/2017   Left axillary seroma status post incision and drainage 05/18/2017 05/18/2017   Cellulitis of left axilla 05/17/2017   GERD (gastroesophageal reflux disease) 05/16/2017   Acquired hypothyroidism 05/16/2017   Depression with anxiety 05/16/2017   Breast cancer of upper-inner quadrant of left female breast ([place]) 03/11/2017   Goals of care, counseling/discussion 03/11/2017   Obesity (BMI 30-39.9) 08/31/2013   Essential hypertension - well controlled 08/31/2013    Class: Diagnosis of   Depression 08/31/2013    Class: Diagnosis of   Vasovagal near-syncope -rare 08/31/2013    Class: History of   Dyslipidemia, goal LDL below 160 08/31/2013    ONSET DATE: 10/05/2022  REFERRING DIAG: I63.50 (ICD-10-CM) - Cerebrovascular accident (CVA) due to occlusion of cerebral artery ([place])   THERAPY DIAG:  Unsteadiness on feet  Muscle weakness (generalized)  Other abnormalities of gait and mobility  Rationale for Evaluation and Treatment: Rehabilitation  SUBJECTIVE:  SUBJECTIVE STATEMENT: Caregiver also present today.  Didn't get a chance to do exercises over the weekend due to a family member health conflict.  Pt accompanied by: family member, daughter   PERTINENT HISTORY: HTN, HLD, hypothyroidism, asthma, depression, anxiety, L breast cancer post-mastectomy 2018, obesity; 03/20/22 with CVA R sided weakness  PAIN:  Are you having pain? Yes: NPRS scale: 4/10 Pain location: shoulder Pain description: aching Aggravating factors: sleeping on it wrong Relieving factors: rubbing it *OT aware PRECAUTIONS: Fall  WEIGHT BEARING RESTRICTIONS: No  FALLS: Has patient fallen in last 6 months? Yes. Number of falls 1  LIVING ENVIRONMENT: Lives with: lives with their family and lives with their daughter Lives in: House/apartment Stairs: No Has following equipment at home: Environmental consultant - 2 wheeled, Environmental consultant - 4 wheeled, and shower chair  PLOF: Independent  PATIENT GOALS: To get back home, to get off the walker  OBJECTIVE:    TODAY'S TREATMENT: 10/19/2022 Activity Comments  Review of HEP from last visit- -sit<>stand -heel raises -lateral weightshift and reach -stagger stance forward/back weightshift -cues for slowed descent into sitting with sit<>stand  Sidestep at counter, 3 reps R and L Cues for increased foot clearance, step length  Alternating step taps 10 reps to 4" shelf  BUE support  Forward step over obstacle, then return to midline, to increase step length, 2 x 5 reps 1 UE support and min guard  Gait with rollator 150 ft, then 75 ft Cues to stay close to rollator, cues for increased step length and heelstrike      Access Code: [email or web address] URL: [email or web address] Date: 10/19/2022 Prepared by: [facility]  Exercises - Sit to Stand with Counter Support  - 1 x  daily - 7 x weekly - 3 sets - 5 reps - Heel Raises with Counter Support  - 1 x daily - 7 x weekly - 3 sets - 10 reps - Side to Side Weight Shift with Overhead Reach and Counter Support  - 1 x daily - 7 x weekly - 3 sets - 10 reps - Staggered Stance Forward Backward Weight Shift with Counter Support  - 1 x daily - 7 x weekly - 3 sets - 10 reps - Alternating Step Taps with Counter Support  - 1 x daily - 5 x weekly - 3 sets - 10 reps - Side Stepping with Counter Support  - 1 x daily - 5 x weekly - 1 sets - 3 reps PATIENT EDUCATION: Education details: HEP additions Person educated: Patient, Child(ren), and Caregiver Kim Education method: Explanation, Demonstration, and Handouts Education comprehension: verbalized understanding, returned demonstration, and needs further education  ------------------------------------------------------------------------------------------------- Objective measures below taken at time of eval:  DIAGNOSTIC FINDINGS: MRI 06/11/22-no changes; 03/21/22 MRI:  acute ischemia within the L caudate body; old R basal ganglia small vessel infarct  COGNITION: Overall cognitive status:  Dysarthria per notes; flat affect   SENSATION: Light touch: WFL   POSTURE: rounded shoulders and forward head  LOWER EXTREMITY ROM:   WFL for BLEs  Active  Right Eval Left Eval  Hip flexion    Hip extension    Hip abduction    Hip adduction    Hip internal rotation    Hip external rotation    Knee flexion    Knee extension    Ankle dorsiflexion    Ankle plantarflexion    Ankle inversion    Ankle eversion     (Blank rows = not tested)  LOWER  EXTREMITY MMT:    MMT Right Eval Left Eval  Hip flexion 3+ 3+  Hip extension    Hip abduction 3+ 3+  Hip adduction 3 3  Hip internal rotation    Hip external rotation    Knee flexion 4 3+  Knee extension 3+ 3+  Ankle dorsiflexion 3+ 3-  Ankle plantarflexion    Ankle inversion    Ankle eversion    (Blank rows = not  tested)  BED MOBILITY:  Requires assist of family, daughter reports providing 50% assistance  TRANSFERS: Assistive device utilized:  UE support to stand at locked rollator   Sit to stand: SBA with UE support Stand to sit: CGA with UE support   GAIT: Gait pattern: step through pattern, decreased step length- Right, decreased step length- Left, and poor foot clearance- Right Distance walked: 50 ft Assistive device utilized: Walker - 4 wheeled Level of assistance: CGA Comments: 10 M walk:  20.34 sec (1.61 ft/sec)  FUNCTIONAL TESTS:  5 times sit to stand: 41.35 sec with UE support Timed up and go (TUG): 45.90 sec with 4-wheeled RW Berg Balance Scale: 24/56 10 M walk:  1.61 ft/sec  with RW  PATIENT SURVEYS:  FOTO intake score 51; predicted score at discharge 53      PATIENT EDUCATION: Education details: Eval results, POC Person educated: Patient and Child(ren) Education method: Explanation Education comprehension: verbalized understanding    GOALS: Goals reviewed with patient? Yes  SHORT TERM GOALS: Target date: 11/13/2022  Pt will be supervision with HEP for improved strength, balance, gait. Baseline: Goal status: INITIAL  2.  Pt will improve 5x sit<>stand to less than or equal to 30 sec to demonstrate improved functional strength and transfer efficiency.  Baseline: 41.35 sec Goal status: INITIAL  3.  Pt will improve Berg score to at least 30/56 to decrease fall risk. Baseline: 24/56 Goal status: INITIAL  4.  Pt will improve TUG score to less than or equal to 35 sec for decreased fall risk. Baseline: 45.9 sec Goal status: INITIAL  LONG TERM GOALS: Target date: 12/11/2022  Pt will be supervision with HEP for improved strength, balance, gait. Baseline:  Goal status: INITIAL  2.  Pt will improve 5x sit<>stand to less than or equal to 20 sec to demonstrate improved functional strength and transfer efficiency.  Baseline:  Goal status: INITIAL  3.  Pt will  improve Berg score to at least 40/56 to decrease fall risk. Baseline:  Goal status: INITIAL  4.  Pt will improve TUG score to less than or equal to 20 sec for decreased fall risk. Baseline:  Goal status: INITIAL  5.  Pt will improve gait velocity to at least 2 ft/sec for improved gait efficiency and safety. Baseline: 1.61 ft/sec Goal status: INITIAL  ASSESSMENT:  CLINICAL IMPRESSION: Reviewed initial HEP and continued to work on standing balance, with focus on increased step length and increased foot clearance.  She is able to do well with foot clearance and with step length with cues, with BUE support, and for short distances with gait.  With fatigue or with distractions (pt states it's too busy to focus in gym today), she returns to smaller step length and decreased foot clearance.  She needs rest breaks throughout, but participates in therapy session without complaints today.  She will continue to benefit from skilled PT towards goals for improved functional mobility, decreased fall risk.  OBJECTIVE IMPAIRMENTS: Abnormal gait, decreased balance, decreased mobility, difficulty walking, decreased strength, and postural dysfunction.  ACTIVITY LIMITATIONS: lifting, bending, standing, squatting, transfers, bed mobility, toileting, hygiene/grooming, and locomotion level  PARTICIPATION LIMITATIONS: meal prep, cleaning, laundry, driving, shopping, and community activity  PERSONAL FACTORS: Time since onset of injury/illness/exacerbation and 3+ comorbidities: see above for PMH  are also affecting patient's functional outcome.   REHAB POTENTIAL: Good  CLINICAL DECISION MAKING: Evolving/moderate complexity  EVALUATION COMPLEXITY: Moderate  PLAN:  PT FREQUENCY: 2x/week  PT DURATION: 8 weeks plus 1x/wk (during eval week)-9 week POC  PLANNED INTERVENTIONS: Therapeutic exercises, Therapeutic activity, Neuromuscular re-education, Balance training, Gait training, Patient/Family education, and  Self Care  PLAN FOR NEXT SESSION: Review additions to HEP this visit. Addition of large amplitude weight shift activities.  Continue to work on hip stability, single limb stance, step length, lower extremity strengthening.   Mady Haagensen, PT 10/19/22 5:10 PM Phone: [phone] Fax: [phone]   [facility] [address], [place], [place] [address] Phone # [phone] Fax # [phone]

## 2022-10-19 NOTE — Telephone Encounter (Signed)
PA submitted for Modafinil through cover my meds

## 2022-10-21 ENCOUNTER — Ambulatory Visit: Payer: Medicare Other

## 2022-10-21 DIAGNOSIS — R293 Abnormal posture: Secondary | ICD-10-CM | POA: Diagnosis not present

## 2022-10-21 DIAGNOSIS — R2681 Unsteadiness on feet: Secondary | ICD-10-CM | POA: Diagnosis not present

## 2022-10-21 DIAGNOSIS — R2689 Other abnormalities of gait and mobility: Secondary | ICD-10-CM

## 2022-10-21 DIAGNOSIS — I69354 Hemiplegia and hemiparesis following cerebral infarction affecting left non-dominant side: Secondary | ICD-10-CM | POA: Diagnosis not present

## 2022-10-21 DIAGNOSIS — R471 Dysarthria and anarthria: Secondary | ICD-10-CM | POA: Diagnosis not present

## 2022-10-21 DIAGNOSIS — M6281 Muscle weakness (generalized): Secondary | ICD-10-CM

## 2022-10-21 DIAGNOSIS — G8929 Other chronic pain: Secondary | ICD-10-CM | POA: Diagnosis not present

## 2022-10-21 DIAGNOSIS — M25511 Pain in right shoulder: Secondary | ICD-10-CM | POA: Diagnosis not present

## 2022-10-21 DIAGNOSIS — R4701 Aphasia: Secondary | ICD-10-CM | POA: Diagnosis not present

## 2022-10-21 NOTE — Therapy (Signed)
OUTPATIENT PHYSICAL THERAPY NEURO TREATMENT   Patient Name: Jessica [name] MRN: [id number] DOB:1941/10/18, 81 y.o., female Today's Date: 10/21/2022   PCP: [name], MD REFERRING PROVIDER: Izora Ribas, MD  END OF SESSION:  PT End of Session - 10/21/22 1316     Visit Number 4    Number of Visits 17    Date for PT Re-Evaluation 12/11/22    Authorization Type [employer] Medicare    PT Start Time 1315    PT Stop Time 1400    PT Time Calculation (min) 45 min    Equipment Utilized During Treatment Gait belt    Activity Tolerance Patient limited by lethargy    Behavior During Therapy Flat affect             Past Medical History:  Diagnosis Date   Allergy    Anemia    Anxiety    Asthma    in past, no inhalers now   Blood transfusion without reported diagnosis    Breast cancer ([place]) 01/2017   left breast    Breast cancer of upper-inner quadrant of left female breast ([place]) 03/11/2017   Cancer ([place]) 01/2017   left breast   Cataract    bilateral removed   Chronic kidney disease    kidney stones   Depression 08/31/2013   Dyslipidemia, goal LDL below 160 08/31/2013   Essential hypertension - well controlled 08/31/2013   Family history of breast cancer    Family history of melanoma    Family history of prostate cancer    GERD (gastroesophageal reflux disease)    Goals of care, counseling/discussion 03/11/2017   History of radiation therapy 10/13/17-11/11/17   left breast 40.05 Gy in 15 fractions, left breast boost 10 Gy in 5 fractions   Hypothyroidism    Ischemic stroke [facility])    May 2023;   Obesity (BMI 30-39.9) 08/31/2013   Personal history of chemotherapy 2018   Personal history of radiation therapy 2019   Thyroid disease    Vasovagal near-syncope -rare 08/31/2013   Past Surgical History:  Procedure Laterality Date   ABDOMINAL HYSTERECTOMY     total   BREAST BIOPSY Left 02/24/2017    malignant   BREAST LUMPECTOMY Left 03/23/2017   broken fingers      CATARACT EXTRACTION Bilateral    COLONOSCOPY     COLONOSCOPY WITH PROPOFOL N/A 06/03/2022   Procedure: COLONOSCOPY WITH PROPOFOL;  Surgeon: [name], MD;  Location: [facility];  Service: Gastroenterology;  Laterality: N/A;   ESOPHAGOGASTRODUODENOSCOPY (EGD) WITH PROPOFOL N/A 05/29/2022   Procedure: ESOPHAGOGASTRODUODENOSCOPY (EGD) WITH PROPOFOL;  Surgeon: Jerene Bears, MD;  Location: [facility];  Service: Gastroenterology;  Laterality: N/A;   EXCISIONAL HEMORRHOIDECTOMY     IR FLUORO GUIDE PORT INSERTION RIGHT  08/04/2017   IR REMOVAL TUN ACCESS W/ PORT W/O FL MOD SED  08/04/2017   IR US GUIDE VASC ACCESS RIGHT  08/04/2017   IRRIGATION AND DEBRIDEMENT ABSCESS Left 05/18/2017   Procedure: IRRIGATION AND DEBRIDEMENT LEFT AXILLARY ABSCESS;  Surgeon: Michael Boston, MD;  Location: [facility];  Service: General;  Laterality: Left;   kidney stones lithotripsy     POLYPECTOMY  06/03/2022   Procedure: POLYPECTOMY;  Surgeon: [name], MD;  Location: [facility];  Service: Gastroenterology;;   Sol Passer PLACEMENT Right 03/23/2017   Procedure: INSERTION PORT-A-CATH;  Surgeon: Erroll Luna, MD;  Location: [facility];  Service: General;  Laterality: Right;   RADIOACTIVE SEED GUIDED PARTIAL MASTECTOMY WITH AXILLARY  SENTINEL LYMPH NODE BIOPSY Left 03/23/2017   Procedure: LEFT BREAST RADIOACTIVE SEED GUIDED PARTIAL MASTECTOMY AND  SENTINEL LYMPH NODE MAPPING;  Surgeon: Erroll Luna, MD;  Location: [facility];  Service: General;  Laterality: Left;   ROTATOR CUFF REPAIR     left    TONSILLECTOMY     WISDOM TOOTH EXTRACTION     Patient Active Problem List   Diagnosis Date Noted   Altered mental status 06/11/2022   Acute encephalopathy 06/10/2022   UTI (urinary tract infection) 06/10/2022   Constipation    History of stroke in adulthood 06/05/2022   Heme positive stool    Iron deficiency anemia due to chronic blood loss    Left thalamic infarction ([place])  03/23/2022   Acquired hammer toe of right foot 08/19/2021   Adverse reaction to ACE-I (angiotensin-converting enzyme inhibitor) 08/19/2021   Allergic rhinitis 08/19/2021   Anxiety 08/19/2021   Asthma 08/19/2021   Cardiomegaly 08/19/2021   Dependence on other enabling machines and devices 08/19/2021   History of adenomatous polyp of colon 08/19/2021   History of pneumonia 08/19/2021   Internal hemorrhoid 08/19/2021   Iron deficiency anemia secondary to inadequate dietary iron intake 08/19/2021   Osteopenia 08/19/2021   Personal history of malignant neoplasm of breast 08/19/2021   Somnolence 08/19/2021   Vitamin D deficiency 08/19/2021   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   Insomnia 06/25/2019   Obstructive sleep apnea 06/13/2019   Genetic testing 06/29/2017   Family history of melanoma    Family history of prostate cancer    Family history of breast cancer    Angioedema 05/26/2017   Left axillary seroma status post incision and drainage 05/18/2017 05/18/2017   Cellulitis of left axilla 05/17/2017   GERD (gastroesophageal reflux disease) 05/16/2017   Acquired hypothyroidism 05/16/2017   Depression with anxiety 05/16/2017   Breast cancer of upper-inner quadrant of left female breast ([place]) 03/11/2017   Goals of care, counseling/discussion 03/11/2017   Obesity (BMI 30-39.9) 08/31/2013   Essential hypertension - well controlled 08/31/2013    Class: Diagnosis of   Depression 08/31/2013    Class: Diagnosis of   Vasovagal near-syncope -rare 08/31/2013    Class: History of   Dyslipidemia, goal LDL below 160 08/31/2013    ONSET DATE: 10/05/2022  REFERRING DIAG: I63.50 (ICD-10-CM) - Cerebrovascular accident (CVA) due to occlusion of cerebral artery ([place])   THERAPY DIAG:  Unsteadiness on feet  Muscle weakness (generalized)  Other abnormalities of gait and mobility  Abnormal posture  Rationale for Evaluation and Treatment: Rehabilitation  SUBJECTIVE:  SUBJECTIVE STATEMENT: Caregiver also present today.    Pt accompanied by: family member, daughter   PERTINENT HISTORY: HTN, HLD, hypothyroidism, asthma, depression, anxiety, L breast cancer post-mastectomy 2018, obesity; 03/20/22 with CVA R sided weakness  PAIN:  Are you having pain? Yes: NPRS scale: 4/10 Pain location: shoulder Pain description: aching Aggravating factors: sleeping on it wrong Relieving factors: rubbing it *OT aware PRECAUTIONS: Fall  WEIGHT BEARING RESTRICTIONS: No  FALLS: Has patient fallen in last 6 months? Yes. Number of falls 1  LIVING ENVIRONMENT: Lives with: lives with their family and lives with their daughter Lives in: House/apartment Stairs: No Has following equipment at home: Environmental consultant - 2 wheeled, Environmental consultant - 4 wheeled, and shower chair  PLOF: Independent  PATIENT GOALS: To get back home, to get off the walker  OBJECTIVE:  Vitals: 148/84 mmHg, 78 bpm  TODAY'S TREATMENT: 10/21/22 Activity Comments  vitals   Forward/backward x 2 min Parallel bars  Sidestepping x 2 min Parallel bars  HEP review -heel raise -lateral weight shift reach -Staggered Stance Forward Backward Weight Shift -Alt step taps with counter support  balance -limb advance on 6" box and bean bag toss -balance beam tandem with bean bag carry -item pick up from ground 1x -standing on foam: EO/EC, head turns EO -standing on firm: EC head turns  LAQ 3x12 2#     TODAY'S TREATMENT: 10/19/2022 Activity Comments  Review of HEP from last visit- -sit<>stand -heel raises -lateral weightshift and reach -stagger stance forward/back weightshift -cues for slowed descent into sitting with sit<>stand  Sidestep at counter, 3 reps R and L Cues for increased foot clearance, step length  Alternating step taps 10 reps to 4" shelf  BUE  support  Forward step over obstacle, then return to midline, to increase step length, 2 x 5 reps 1 UE support and min guard  Gait with rollator 150 ft, then 75 ft Cues to stay close to rollator, cues for increased step length and heelstrike      Access Code: [email or web address] URL: [email or web address] Date: 10/19/2022 Prepared by: [facility]  Exercises - Sit to Stand with Counter Support  - 1 x daily - 7 x weekly - 3 sets - 5 reps - Heel Raises with Counter Support  - 1 x daily - 7 x weekly - 3 sets - 10 reps - Side to Side Weight Shift with Overhead Reach and Counter Support  - 1 x daily - 7 x weekly - 3 sets - 10 reps - Staggered Stance Forward Backward Weight Shift with Counter Support  - 1 x daily - 7 x weekly - 3 sets - 10 reps - Alternating Step Taps with Counter Support  - 1 x daily - 5 x weekly - 3 sets - 10 reps - Side Stepping with Counter Support  - 1 x daily - 5 x weekly - 1 sets - 3 reps PATIENT EDUCATION: Education details: HEP additions Person educated: Patient, Child(ren), and Caregiver Kim Education method: Explanation, Demonstration, and Handouts Education comprehension: verbalized understanding, returned demonstration, and needs further education  ------------------------------------------------------------------------------------------------- Objective measures below taken at time of eval:  DIAGNOSTIC FINDINGS: MRI 06/11/22-no changes; 03/21/22 MRI:  acute ischemia within the L caudate body; old R basal ganglia small vessel infarct  COGNITION: Overall cognitive status:  Dysarthria per notes; flat affect   SENSATION: Light touch: WFL   POSTURE: rounded shoulders and forward head  LOWER EXTREMITY ROM:   WFL for BLEs  Active  Right Eval Left Eval  Hip flexion    Hip extension    Hip abduction    Hip adduction    Hip internal rotation    Hip external rotation    Knee flexion    Knee extension    Ankle  dorsiflexion    Ankle plantarflexion    Ankle inversion    Ankle eversion     (Blank rows = not tested)  LOWER EXTREMITY MMT:    MMT Right Eval Left Eval  Hip flexion 3+ 3+  Hip extension    Hip abduction 3+ 3+  Hip adduction 3 3  Hip internal rotation    Hip external rotation    Knee flexion 4 3+  Knee extension 3+ 3+  Ankle dorsiflexion 3+ 3-  Ankle plantarflexion    Ankle inversion    Ankle eversion    (Blank rows = not tested)  BED MOBILITY:  Requires assist of family, daughter reports providing 50% assistance  TRANSFERS: Assistive device utilized:  UE support to stand at locked rollator   Sit to stand: SBA with UE support Stand to sit: CGA with UE support   GAIT: Gait pattern: step through pattern, decreased step length- Right, decreased step length- Left, and poor foot clearance- Right Distance walked: 50 ft Assistive device utilized: Walker - 4 wheeled Level of assistance: CGA Comments: 10 M walk:  20.34 sec (1.61 ft/sec)  FUNCTIONAL TESTS:  5 times sit to stand: 41.35 sec with UE support Timed up and go (TUG): 45.90 sec with 4-wheeled RW Berg Balance Scale: 24/56 10 M walk:  1.61 ft/sec  with RW  PATIENT SURVEYS:  FOTO intake score 51; predicted score at discharge 50      PATIENT EDUCATION: Education details: Eval results, POC Person educated: Patient and Child(ren) Education method: Explanation Education comprehension: verbalized understanding    GOALS: Goals reviewed with patient? Yes  SHORT TERM GOALS: Target date: 11/13/2022  Pt will be supervision with HEP for improved strength, balance, gait. Baseline: Goal status: IN PROGRESS  2.  Pt will improve 5x sit<>stand to less than or equal to 30 sec to demonstrate improved functional strength and transfer efficiency.  Baseline: 41.35 sec Goal status: IN PROGRESS  3.  Pt will improve Berg score to at least 30/56 to decrease fall risk. Baseline: 24/56 Goal status: IN PROGRESS  4.  Pt  will improve TUG score to less than or equal to 35 sec for decreased fall risk. Baseline: 45.9 sec Goal status: INITIAL  LONG TERM GOALS: Target date: 12/11/2022  Pt will be supervision with HEP for improved strength, balance, gait. Baseline:  Goal status: IN PROGRESS  2.  Pt will improve 5x sit<>stand to less than or equal to 20 sec to demonstrate improved functional strength and transfer efficiency.  Baseline:  Goal status: IN PROGRESS  3.  Pt will improve Berg score to at least 40/56 to decrease fall risk. Baseline:  Goal status: IN PROGRESS  4.  Pt will improve TUG score to less than or equal to 20 sec for decreased fall risk. Baseline:  Goal status: IN PROGRESS  5.  Pt will improve gait velocity to at least 2 ft/sec for improved gait efficiency and safety. Baseline: 1.61 ft/sec Goal status: IN PROGRESS  ASSESSMENT:  CLINICAL IMPRESSION: Continued with activities to improve larger amplitude movements and coordination as well as balance to improve safety with ADL.  Demo supervision performance for current HEP requiring visual demonstration. Progressed with additional coordinatoin activities to improve  motor dual tasking and balance usually requiring unilat UE support during dynamic tasks. Improved static balance performing activities without UE support. Supervision for picking up item from ground and requiring unilat UE support to do so. Continued sessions to progress balance, coordination, control, activity tolerance, and safety with mobility to reduce risk for falls  OBJECTIVE IMPAIRMENTS: Abnormal gait, decreased balance, decreased mobility, difficulty walking, decreased strength, and postural dysfunction.   ACTIVITY LIMITATIONS: lifting, bending, standing, squatting, transfers, bed mobility, toileting, hygiene/grooming, and locomotion level  PARTICIPATION LIMITATIONS: meal prep, cleaning, laundry, driving, shopping, and community activity  PERSONAL FACTORS: Time since onset  of injury/illness/exacerbation and 3+ comorbidities: see above for PMH  are also affecting patient's functional outcome.   REHAB POTENTIAL: Good  CLINICAL DECISION MAKING: Evolving/moderate complexity  EVALUATION COMPLEXITY: Moderate  PLAN:  PT FREQUENCY: 2x/week  PT DURATION: 8 weeks plus 1x/wk (during eval week)-9 week POC  PLANNED INTERVENTIONS: Therapeutic exercises, Therapeutic activity, Neuromuscular re-education, Balance training, Gait training, Patient/Family education, and Self Care  PLAN FOR NEXT SESSION: Review additions to HEP this visit. Addition of large amplitude weight shift activities.  Continue to work on hip stability, single limb stance, step length, lower extremity strengthening.   1:58 PM, 10/21/22 M. Sherlyn Lees, PT, DPT Physical Therapist- [facility] Office Number: [phone]    [facility] [address] [place], [place] [address] Phone # [phone] Fax # [phone]

## 2022-10-22 NOTE — Therapy (Signed)
OUTPATIENT PHYSICAL THERAPY NEURO TREATMENT   Patient Name: Jessica [name] MRN: [id number] DOB:[birth year], 81 y.o., female Today's Date: 10/27/2022   PCP: [name], MD REFERRING PROVIDER: Izora Ribas, MD  END OF SESSION:  PT End of Session - 10/27/22 1357     Visit Number 5    Number of Visits 17    Date for PT Re-Evaluation 12/11/22    Authorization Type [employer] Medicare    PT Start Time 1308    PT Stop Time 0947    PT Time Calculation (min) 47 min    Equipment Utilized During Treatment Gait belt    Activity Tolerance Patient tolerated treatment well    Behavior During Therapy Flat affect              Past Medical History:  Diagnosis Date   Allergy    Anemia    Anxiety    Asthma    in past, no inhalers now   Blood transfusion without reported diagnosis    Breast cancer ([place]) 01/2017   left breast    Breast cancer of upper-inner quadrant of left female breast ([place]) 03/11/2017   Cancer ([place]) 01/2017   left breast   Cataract    bilateral removed   Chronic kidney disease    kidney stones   Depression 08/31/2013   Dyslipidemia, goal LDL below 160 08/31/2013   Essential hypertension - well controlled 08/31/2013   Family history of breast cancer    Family history of melanoma    Family history of prostate cancer    GERD (gastroesophageal reflux disease)    Goals of care, counseling/discussion 03/11/2017   History of radiation therapy 10/13/17-11/11/17   left breast 40.05 Gy in 15 fractions, left breast boost 10 Gy in 5 fractions   Hypothyroidism    Ischemic stroke [facility])    May 2023;   Obesity (BMI 30-39.9) 08/31/2013   Personal history of chemotherapy 2018   Personal history of radiation therapy 2019   Thyroid disease    Vasovagal near-syncope -rare 08/31/2013   Past Surgical History:  Procedure Laterality Date   ABDOMINAL HYSTERECTOMY     total   BREAST BIOPSY Left 02/24/2017    malignant   BREAST LUMPECTOMY Left 03/23/2017   broken  fingers     CATARACT EXTRACTION Bilateral    COLONOSCOPY     COLONOSCOPY WITH PROPOFOL N/A 06/03/2022   Procedure: COLONOSCOPY WITH PROPOFOL;  Surgeon: [name], MD;  Location: [facility];  Service: Gastroenterology;  Laterality: N/A;   ESOPHAGOGASTRODUODENOSCOPY (EGD) WITH PROPOFOL N/A 05/29/2022   Procedure: ESOPHAGOGASTRODUODENOSCOPY (EGD) WITH PROPOFOL;  Surgeon: Jerene Bears, MD;  Location: [facility];  Service: Gastroenterology;  Laterality: N/A;   EXCISIONAL HEMORRHOIDECTOMY     IR FLUORO GUIDE PORT INSERTION RIGHT  08/04/2017   IR REMOVAL TUN ACCESS W/ PORT W/O FL MOD SED  08/04/2017   IR US GUIDE VASC ACCESS RIGHT  08/04/2017   IRRIGATION AND DEBRIDEMENT ABSCESS Left 05/18/2017   Procedure: IRRIGATION AND DEBRIDEMENT LEFT AXILLARY ABSCESS;  Surgeon: Michael Boston, MD;  Location: [facility];  Service: General;  Laterality: Left;   kidney stones lithotripsy     POLYPECTOMY  06/03/2022   Procedure: POLYPECTOMY;  Surgeon: [name], MD;  Location: [facility];  Service: Gastroenterology;;   Sol Passer PLACEMENT Right 03/23/2017   Procedure: INSERTION PORT-A-CATH;  Surgeon: Erroll Luna, MD;  Location: [facility];  Service: General;  Laterality: Right;   RADIOACTIVE SEED GUIDED PARTIAL MASTECTOMY WITH  AXILLARY SENTINEL LYMPH NODE BIOPSY Left 03/23/2017   Procedure: LEFT BREAST RADIOACTIVE SEED GUIDED PARTIAL MASTECTOMY AND  SENTINEL LYMPH NODE MAPPING;  Surgeon: Erroll Luna, MD;  Location: [facility];  Service: General;  Laterality: Left;   ROTATOR CUFF REPAIR     left    TONSILLECTOMY     WISDOM TOOTH EXTRACTION     Patient Active Problem List   Diagnosis Date Noted   Altered mental status 06/11/2022   Acute encephalopathy 06/10/2022   UTI (urinary tract infection) 06/10/2022   Constipation    History of stroke in adulthood 06/05/2022   Heme positive stool    Iron deficiency anemia due to chronic blood loss    Left thalamic  infarction ([place]) 03/23/2022   Acquired hammer toe of right foot 08/19/2021   Adverse reaction to ACE-I (angiotensin-converting enzyme inhibitor) 08/19/2021   Allergic rhinitis 08/19/2021   Anxiety 08/19/2021   Asthma 08/19/2021   Cardiomegaly 08/19/2021   Dependence on other enabling machines and devices 08/19/2021   History of adenomatous polyp of colon 08/19/2021   History of pneumonia 08/19/2021   Internal hemorrhoid 08/19/2021   Iron deficiency anemia secondary to inadequate dietary iron intake 08/19/2021   Osteopenia 08/19/2021   Personal history of malignant neoplasm of breast 08/19/2021   Somnolence 08/19/2021   Vitamin D deficiency 08/19/2021   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   Insomnia 06/25/2019   Obstructive sleep apnea 06/13/2019   Genetic testing 06/29/2017   Family history of melanoma    Family history of prostate cancer    Family history of breast cancer    Angioedema 05/26/2017   Left axillary seroma status post incision and drainage 05/18/2017 05/18/2017   Cellulitis of left axilla 05/17/2017   GERD (gastroesophageal reflux disease) 05/16/2017   Acquired hypothyroidism 05/16/2017   Depression with anxiety 05/16/2017   Breast cancer of upper-inner quadrant of left female breast ([place]) 03/11/2017   Goals of care, counseling/discussion 03/11/2017   Obesity (BMI 30-39.9) 08/31/2013   Essential hypertension - well controlled 08/31/2013    Class: Diagnosis of   Depression 08/31/2013    Class: Diagnosis of   Vasovagal near-syncope -rare 08/31/2013    Class: History of   Dyslipidemia, goal LDL below 160 08/31/2013    ONSET DATE: 10/05/2022  REFERRING DIAG: I63.50 (ICD-10-CM) - Cerebrovascular accident (CVA) due to occlusion of cerebral artery ([place])   THERAPY DIAG:  Unsteadiness on feet  Muscle weakness (generalized)  Other abnormalities of gait and mobility  Rationale for Evaluation and Treatment: Rehabilitation  SUBJECTIVE:  SUBJECTIVE STATEMENT: Doing okay. Slid off the bed this AM but her daughter caught her.   Pt accompanied by: family member, daughterAntony Salmon   PERTINENT HISTORY: HTN, HLD, hypothyroidism, asthma, depression, anxiety, L breast cancer post-mastectomy 2018, obesity; 03/20/22 with CVA R sided weakness  PAIN:  Are you having pain? Yes: NPRS scale: 0/10 Pain location: shoulder Pain description: aching Aggravating factors: sleeping on it wrong Relieving factors: rubbing it *OT aware PRECAUTIONS: Fall  WEIGHT BEARING RESTRICTIONS: No  FALLS: Has patient fallen in last 6 months? Yes. Number of falls 1  LIVING ENVIRONMENT: Lives with: lives with their family and lives with their daughter Lives in: House/apartment Stairs: No Has following equipment at home: Environmental consultant - 2 wheeled, Environmental consultant - 4 wheeled, and shower chair  PLOF: Independent  PATIENT GOALS: To get back home, to get off the walker  OBJECTIVE:      TODAY'S TREATMENT: 10/27/22 Activity Comments  Nustep L3 x 4 min Ues/Les, with Les only 2 more minutes d/t L shoulder pain Very slow- cueing to increase pace to at least 40 SPM  alt toe taps on step 3x30" With 1 UE support and CGA; cueing to increase pace   alt step ups 10x each LE Posterior wt shift requiring corrective cues and to place foot fully onto step   STS x8 Cueing for 1. scoot bottom to edge of seat 2. tuck feet underneath you 3. lean nose over toes  Bed mobility sit<>supine and R/L rolling; practice L sidelying to sit 2x  Cueing to slide feet off, then use hand and elbow to push up to sit for sidelying to sit; min A      HOME EXERCISE PROGRAM Last updated: 10/27/22 Access Code: [email or web address] URL: [email or web address] Date: 10/27/2022 Prepared by: [facility]  Exercises - Sit to Stand with Counter Support  - 1 x daily - 7 x weekly - 3 sets - 5 reps 1. scoot bottom to edge of seat 2. tuck feet underneath you 3. lean nose over toes - Heel Raises with Counter Support  - 1 x daily - 7 x weekly - 3 sets - 10 reps - Side to Side Weight Shift with Overhead Reach and Counter Support  - 1 x daily - 7 x weekly - 3 sets - 10 reps - Staggered Stance Forward Backward Weight Shift with Counter Support  - 1 x daily - 7 x weekly - 3 sets - 10 reps - Alternating Step Taps with Counter Support  - 1 x daily - 5 x weekly - 3 sets - 10 reps - Side Stepping with Counter Support  - 1 x daily - 5 x weekly - 1 sets - 3 reps   PATIENT EDUCATION: Education details: HEP update with edu on steps for safe transfers Person educated: Patient and Child(ren) Education method: Explanation, Demonstration, Tactile cues, Verbal cues, and Handouts Education comprehension: verbalized understanding and returned demonstration    ------------------------------------------------------------------------------------------------- Objective measures below taken at time of eval:  DIAGNOSTIC FINDINGS: MRI 06/11/22-no changes; 03/21/22 MRI:  acute ischemia within the L caudate body; old R basal ganglia small vessel infarct  COGNITION: Overall cognitive status:  Dysarthria per notes; flat affect   SENSATION: Light touch: WFL   POSTURE: rounded shoulders and forward head  LOWER EXTREMITY ROM:   WFL for BLEs  Active  Right Eval Left Eval  Hip flexion    Hip extension    Hip abduction    Hip adduction  Hip internal rotation    Hip external rotation    Knee flexion    Knee extension    Ankle dorsiflexion    Ankle plantarflexion    Ankle inversion    Ankle eversion     (Blank rows = not tested)  LOWER EXTREMITY MMT:    MMT Right Eval Left Eval  Hip flexion 3+ 3+  Hip extension    Hip abduction 3+ 3+  Hip adduction 3 3  Hip internal rotation    Hip external  rotation    Knee flexion 4 3+  Knee extension 3+ 3+  Ankle dorsiflexion 3+ 3-  Ankle plantarflexion    Ankle inversion    Ankle eversion    (Blank rows = not tested)  BED MOBILITY:  Requires assist of family, daughter reports providing 50% assistance  TRANSFERS: Assistive device utilized:  UE support to stand at locked rollator   Sit to stand: SBA with UE support Stand to sit: CGA with UE support   GAIT: Gait pattern: step through pattern, decreased step length- Right, decreased step length- Left, and poor foot clearance- Right Distance walked: 50 ft Assistive device utilized: Walker - 4 wheeled Level of assistance: CGA Comments: 10 M walk:  20.34 sec (1.61 ft/sec)  FUNCTIONAL TESTS:  5 times sit to stand: 41.35 sec with UE support Timed up and go (TUG): 45.90 sec with 4-wheeled RW Berg Balance Scale: 24/56 10 M walk:  1.61 ft/sec  with RW  PATIENT SURVEYS:  FOTO intake score 51; predicted score at discharge 53      PATIENT EDUCATION: Education details: Eval results, POC Person educated: Patient and Child(ren) Education method: Explanation Education comprehension: verbalized understanding    GOALS: Goals reviewed with patient? Yes  SHORT TERM GOALS: Target date: 11/13/2022  Pt will be supervision with HEP for improved strength, balance, gait. Baseline: Goal status: IN PROGRESS  2.  Pt will improve 5x sit<>stand to less than or equal to 30 sec to demonstrate improved functional strength and transfer efficiency.  Baseline: 41.35 sec Goal status: IN PROGRESS  3.  Pt will improve Berg score to at least 30/56 to decrease fall risk. Baseline: 24/56 Goal status: IN PROGRESS  4.  Pt will improve TUG score to less than or equal to 35 sec for decreased fall risk. Baseline: 45.9 sec Goal status: INITIAL  LONG TERM GOALS: Target date: 12/11/2022  Pt will be supervision with HEP for improved strength, balance, gait. Baseline:  Goal status: IN PROGRESS  2.  Pt  will improve 5x sit<>stand to less than or equal to 20 sec to demonstrate improved functional strength and transfer efficiency.  Baseline:  Goal status: IN PROGRESS  3.  Pt will improve Berg score to at least 40/56 to decrease fall risk. Baseline:  Goal status: IN PROGRESS  4.  Pt will improve TUG score to less than or equal to 20 sec for decreased fall risk. Baseline:  Goal status: IN PROGRESS  5.  Pt will improve gait velocity to at least 2 ft/sec for improved gait efficiency and safety. Baseline: 1.61 ft/sec Goal status: IN PROGRESS  ASSESSMENT:  CLINICAL IMPRESSION: Patient arrived to session with report of near-fall today, requiring her daughter to catch her. Worked on power generation and speed work as patient is quite bradykinetic in her movements. Daughter reports difficulty getting out of bed, chair, or couch. Practiced STS transfers and bed mobility with cueing for proper form and safety. Patient able to demonstrate STS and sidelying>sit with  improved ease at end of session. No complaints upon leaving.   OBJECTIVE IMPAIRMENTS: Abnormal gait, decreased balance, decreased mobility, difficulty walking, decreased strength, and postural dysfunction.   ACTIVITY LIMITATIONS: lifting, bending, standing, squatting, transfers, bed mobility, toileting, hygiene/grooming, and locomotion level  PARTICIPATION LIMITATIONS: meal prep, cleaning, laundry, driving, shopping, and community activity  PERSONAL FACTORS: Time since onset of injury/illness/exacerbation and 3+ comorbidities: see above for PMH  are also affecting patient's functional outcome.   REHAB POTENTIAL: Good  CLINICAL DECISION MAKING: Evolving/moderate complexity  EVALUATION COMPLEXITY: Moderate  PLAN:  PT FREQUENCY: 2x/week  PT DURATION: 8 weeks plus 1x/wk (during eval week)-9 week POC  PLANNED INTERVENTIONS: Therapeutic exercises, Therapeutic activity, Neuromuscular re-education, Balance training, Gait training,  Patient/Family education, and Self Care  PLAN FOR NEXT SESSION: review STS and supine>sit; Addition of large amplitude weight shift activities.  Continue to work on hip stability, single limb stance, step length, lower extremity strengthening.   Janene Harvey, PT, DPT 10/27/22 1:58 PM  [facility] at [facility] [address] [place], [place], [place] [address] Phone # [phone] Fax # [phone]

## 2022-10-22 NOTE — Telephone Encounter (Signed)
Modafinil Tab 100 MG denied by insurance. Faxed returned information in Dr. Aline August box  for review.

## 2022-10-27 ENCOUNTER — Ambulatory Visit: Payer: Medicare Other | Admitting: Physical Therapy

## 2022-10-27 ENCOUNTER — Encounter: Payer: Self-pay | Admitting: Physical Therapy

## 2022-10-27 DIAGNOSIS — R2681 Unsteadiness on feet: Secondary | ICD-10-CM

## 2022-10-27 DIAGNOSIS — M6281 Muscle weakness (generalized): Secondary | ICD-10-CM | POA: Diagnosis not present

## 2022-10-27 DIAGNOSIS — G8929 Other chronic pain: Secondary | ICD-10-CM | POA: Diagnosis not present

## 2022-10-27 DIAGNOSIS — R293 Abnormal posture: Secondary | ICD-10-CM | POA: Diagnosis not present

## 2022-10-27 DIAGNOSIS — R2689 Other abnormalities of gait and mobility: Secondary | ICD-10-CM | POA: Diagnosis not present

## 2022-10-27 DIAGNOSIS — M25511 Pain in right shoulder: Secondary | ICD-10-CM | POA: Diagnosis not present

## 2022-10-27 DIAGNOSIS — R4701 Aphasia: Secondary | ICD-10-CM | POA: Diagnosis not present

## 2022-10-27 DIAGNOSIS — R471 Dysarthria and anarthria: Secondary | ICD-10-CM | POA: Diagnosis not present

## 2022-10-27 DIAGNOSIS — I69354 Hemiplegia and hemiparesis following cerebral infarction affecting left non-dominant side: Secondary | ICD-10-CM | POA: Diagnosis not present

## 2022-10-28 NOTE — Therapy (Signed)
OUTPATIENT PHYSICAL THERAPY NEURO TREATMENT   Patient Name: Jessica [name] MRN: [id number] DOB:1941-10-12, 81 y.o., female Today's Date: 10/29/2022   PCP: [name], MD REFERRING PROVIDER: Izora Ribas, MD  END OF SESSION:  PT End of Session - 10/29/22 1531     Visit Number 6    Number of Visits 17    Date for PT Re-Evaluation 12/11/22    Authorization Type [employer] Medicare    PT Start Time 1448    PT Stop Time 1530    PT Time Calculation (min) 42 min    Equipment Utilized During Treatment Gait belt    Activity Tolerance Patient tolerated treatment well    Behavior During Therapy Flat affect               Past Medical History:  Diagnosis Date   Allergy    Anemia    Anxiety    Asthma    in past, no inhalers now   Blood transfusion without reported diagnosis    Breast cancer ([place]) 01/2017   left breast    Breast cancer of upper-inner quadrant of left female breast ([place]) 03/11/2017   Cancer ([place]) 01/2017   left breast   Cataract    bilateral removed   Chronic kidney disease    kidney stones   Depression 08/31/2013   Dyslipidemia, goal LDL below 160 08/31/2013   Essential hypertension - well controlled 08/31/2013   Family history of breast cancer    Family history of melanoma    Family history of prostate cancer    GERD (gastroesophageal reflux disease)    Goals of care, counseling/discussion 03/11/2017   History of radiation therapy 10/13/17-11/11/17   left breast 40.05 Gy in 15 fractions, left breast boost 10 Gy in 5 fractions   Hypothyroidism    Ischemic stroke [facility])    May 2023;   Obesity (BMI 30-39.9) 08/31/2013   Personal history of chemotherapy 2018   Personal history of radiation therapy 2019   Thyroid disease    Vasovagal near-syncope -rare 08/31/2013   Past Surgical History:  Procedure Laterality Date   ABDOMINAL HYSTERECTOMY     total   BREAST BIOPSY Left 02/24/2017    malignant   BREAST LUMPECTOMY Left 03/23/2017   broken  fingers     CATARACT EXTRACTION Bilateral    COLONOSCOPY     COLONOSCOPY WITH PROPOFOL N/A 06/03/2022   Procedure: COLONOSCOPY WITH PROPOFOL;  Surgeon: [name], MD;  Location: [facility];  Service: Gastroenterology;  Laterality: N/A;   ESOPHAGOGASTRODUODENOSCOPY (EGD) WITH PROPOFOL N/A 05/29/2022   Procedure: ESOPHAGOGASTRODUODENOSCOPY (EGD) WITH PROPOFOL;  Surgeon: Jerene Bears, MD;  Location: [facility];  Service: Gastroenterology;  Laterality: N/A;   EXCISIONAL HEMORRHOIDECTOMY     IR FLUORO GUIDE PORT INSERTION RIGHT  08/04/2017   IR REMOVAL TUN ACCESS W/ PORT W/O FL MOD SED  08/04/2017   IR US GUIDE VASC ACCESS RIGHT  08/04/2017   IRRIGATION AND DEBRIDEMENT ABSCESS Left 05/18/2017   Procedure: IRRIGATION AND DEBRIDEMENT LEFT AXILLARY ABSCESS;  Surgeon: Michael Boston, MD;  Location: [facility];  Service: General;  Laterality: Left;   kidney stones lithotripsy     POLYPECTOMY  06/03/2022   Procedure: POLYPECTOMY;  Surgeon: [name], MD;  Location: [facility];  Service: Gastroenterology;;   Sol Passer PLACEMENT Right 03/23/2017   Procedure: INSERTION PORT-A-CATH;  Surgeon: Erroll Luna, MD;  Location: [facility];  Service: General;  Laterality: Right;   RADIOACTIVE SEED GUIDED PARTIAL MASTECTOMY  WITH AXILLARY SENTINEL LYMPH NODE BIOPSY Left 03/23/2017   Procedure: LEFT BREAST RADIOACTIVE SEED GUIDED PARTIAL MASTECTOMY AND  SENTINEL LYMPH NODE MAPPING;  Surgeon: Erroll Luna, MD;  Location: [facility];  Service: General;  Laterality: Left;   ROTATOR CUFF REPAIR     left    TONSILLECTOMY     WISDOM TOOTH EXTRACTION     Patient Active Problem List   Diagnosis Date Noted   Altered mental status 06/11/2022   Acute encephalopathy 06/10/2022   UTI (urinary tract infection) 06/10/2022   Constipation    History of stroke in adulthood 06/05/2022   Heme positive stool    Iron deficiency anemia due to chronic blood loss    Left thalamic  infarction ([place]) 03/23/2022   Acquired hammer toe of right foot 08/19/2021   Adverse reaction to ACE-I (angiotensin-converting enzyme inhibitor) 08/19/2021   Allergic rhinitis 08/19/2021   Anxiety 08/19/2021   Asthma 08/19/2021   Cardiomegaly 08/19/2021   Dependence on other enabling machines and devices 08/19/2021   History of adenomatous polyp of colon 08/19/2021   History of pneumonia 08/19/2021   Internal hemorrhoid 08/19/2021   Iron deficiency anemia secondary to inadequate dietary iron intake 08/19/2021   Osteopenia 08/19/2021   Personal history of malignant neoplasm of breast 08/19/2021   Somnolence 08/19/2021   Vitamin D deficiency 08/19/2021   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   Insomnia 06/25/2019   Obstructive sleep apnea 06/13/2019   Genetic testing 06/29/2017   Family history of melanoma    Family history of prostate cancer    Family history of breast cancer    Angioedema 05/26/2017   Left axillary seroma status post incision and drainage 05/18/2017 05/18/2017   Cellulitis of left axilla 05/17/2017   GERD (gastroesophageal reflux disease) 05/16/2017   Acquired hypothyroidism 05/16/2017   Depression with anxiety 05/16/2017   Breast cancer of upper-inner quadrant of left female breast ([place]) 03/11/2017   Goals of care, counseling/discussion 03/11/2017   Obesity (BMI 30-39.9) 08/31/2013   Essential hypertension - well controlled 08/31/2013    Class: Diagnosis of   Depression 08/31/2013    Class: Diagnosis of   Vasovagal near-syncope -rare 08/31/2013    Class: History of   Dyslipidemia, goal LDL below 160 08/31/2013    ONSET DATE: 10/05/2022  REFERRING DIAG: I63.50 (ICD-10-CM) - Cerebrovascular accident (CVA) due to occlusion of cerebral artery ([place])   THERAPY DIAG:  Unsteadiness on feet  Muscle weakness (generalized)  Other abnormalities of gait and mobility  Rationale for Evaluation and Treatment: Rehabilitation  SUBJECTIVE:  SUBJECTIVE STATEMENT: Doing good. Slid off the commode this morning- daughter helped her. Denies injury except scratching the R elbow.  Pt accompanied by: family member, 3 daughters  PERTINENT HISTORY: HTN, HLD, hypothyroidism, asthma, depression, anxiety, L breast cancer post-mastectomy 2018, obesity; 03/20/22 with CVA R sided weakness  PAIN:  Are you having pain? Yes: NPRS scale: 0/10 Pain location: shoulder Pain description: aching Aggravating factors: sleeping on it wrong Relieving factors: rubbing it *OT aware PRECAUTIONS: Fall  WEIGHT BEARING RESTRICTIONS: No  FALLS: Has patient fallen in last 6 months? Yes. Number of falls 1  LIVING ENVIRONMENT: Lives with: lives with their family and lives with their daughter Lives in: House/apartment Stairs: No Has following equipment at home: Environmental consultant - 2 wheeled, Environmental consultant - 4 wheeled, and shower chair  PLOF: Independent  PATIENT GOALS: To get back home, to get off the walker  OBJECTIVE:     TODAY'S TREATMENT: 10/29/22 Activity Comments  Nustep Les only L3 x 6 min  Cueing to increase pace   STS 5x Cueing for increased scoot and trunk lean; CGA-min A  Sit<>supine from R and L side Slow and cueing for foot positioning; min A for LE management to get onto L side; otherwise only cueing provided   Bridge x10 Limited amplitude; cueing for proper positioning   Hooklying alt LE extension 10x  Cues for positioning and core contraction; c/o LBP- discontinued   figure 8 turns around cones With 4WW and without AD; cueing for longer step length, particularly L>R      HOME EXERCISE PROGRAM Last updated: 10/27/22 Access Code: [email or web address] URL: [email or web address] Date: 10/27/2022 Prepared by: [facility]  Exercises - Sit to Stand with Counter  Support  - 1 x daily - 7 x weekly - 3 sets - 5 reps 1. scoot bottom to edge of seat 2. tuck feet underneath you 3. lean nose over toes - Heel Raises with Counter Support  - 1 x daily - 7 x weekly - 3 sets - 10 reps - Side to Side Weight Shift with Overhead Reach and Counter Support  - 1 x daily - 7 x weekly - 3 sets - 10 reps - Staggered Stance Forward Backward Weight Shift with Counter Support  - 1 x daily - 7 x weekly - 3 sets - 10 reps - Alternating Step Taps with Counter Support  - 1 x daily - 5 x weekly - 3 sets - 10 reps - Side Stepping with Counter Support  - 1 x daily - 5 x weekly - 1 sets - 3 reps   ------------------------------------------------------------------------------------------------- Objective measures below taken at time of eval:  DIAGNOSTIC FINDINGS: MRI 06/11/22-no changes; 03/21/22 MRI:  acute ischemia within the L caudate body; old R basal ganglia small vessel infarct  COGNITION: Overall cognitive status:  Dysarthria per notes; flat affect   SENSATION: Light touch: WFL   POSTURE: rounded shoulders and forward head  LOWER EXTREMITY ROM:   WFL for BLEs  Active  Right Eval Left Eval  Hip flexion    Hip extension    Hip abduction    Hip adduction    Hip internal rotation    Hip external rotation    Knee flexion    Knee extension    Ankle dorsiflexion    Ankle plantarflexion    Ankle inversion    Ankle eversion     (Blank rows = not tested)  LOWER EXTREMITY MMT:    MMT Right Eval  Left Eval  Hip flexion 3+ 3+  Hip extension    Hip abduction 3+ 3+  Hip adduction 3 3  Hip internal rotation    Hip external rotation    Knee flexion 4 3+  Knee extension 3+ 3+  Ankle dorsiflexion 3+ 3-  Ankle plantarflexion    Ankle inversion    Ankle eversion    (Blank rows = not tested)  BED MOBILITY:  Requires assist of family, daughter reports providing 50% assistance  TRANSFERS: Assistive device utilized:  UE support to stand at locked rollator   Sit  to stand: SBA with UE support Stand to sit: CGA with UE support   GAIT: Gait pattern: step through pattern, decreased step length- Right, decreased step length- Left, and poor foot clearance- Right Distance walked: 50 ft Assistive device utilized: Walker - 4 wheeled Level of assistance: CGA Comments: 10 M walk:  20.34 sec (1.61 ft/sec)  FUNCTIONAL TESTS:  5 times sit to stand: 41.35 sec with UE support Timed up and go (TUG): 45.90 sec with 4-wheeled RW Berg Balance Scale: 24/56 10 M walk:  1.61 ft/sec  with RW  PATIENT SURVEYS:  FOTO intake score 51; predicted score at discharge 53      PATIENT EDUCATION: Education details: Eval results, POC Person educated: Patient and Child(ren) Education method: Explanation Education comprehension: verbalized understanding    GOALS: Goals reviewed with patient? Yes  SHORT TERM GOALS: Target date: 11/13/2022  Pt will be supervision with HEP for improved strength, balance, gait. Baseline: Goal status: IN PROGRESS  2.  Pt will improve 5x sit<>stand to less than or equal to 30 sec to demonstrate improved functional strength and transfer efficiency.  Baseline: 41.35 sec Goal status: IN PROGRESS  3.  Pt will improve Berg score to at least 30/56 to decrease fall risk. Baseline: 24/56 Goal status: IN PROGRESS  4.  Pt will improve TUG score to less than or equal to 35 sec for decreased fall risk. Baseline: 45.9 sec Goal status: INITIAL  LONG TERM GOALS: Target date: 12/11/2022  Pt will be supervision with HEP for improved strength, balance, gait. Baseline:  Goal status: IN PROGRESS  2.  Pt will improve 5x sit<>stand to less than or equal to 20 sec to demonstrate improved functional strength and transfer efficiency.  Baseline:  Goal status: IN PROGRESS  3.  Pt will improve Berg score to at least 40/56 to decrease fall risk. Baseline:  Goal status: IN PROGRESS  4.  Pt will improve TUG score to less than or equal to 20 sec for  decreased fall risk. Baseline:  Goal status: IN PROGRESS  5.  Pt will improve gait velocity to at least 2 ft/sec for improved gait efficiency and safety. Baseline: 1.61 ft/sec Goal status: IN PROGRESS  ASSESSMENT:  CLINICAL IMPRESSION: Patient arrived to session with report of sliding off the commode this AM; daughter reports that patient's pajama bottom was under her foot causing her to slide; reports plans to have patient wear house shoes for transfers from now on. Worked on reviewing STS transfers with improved ease evident today; still requiring some cueing and physical assist for max safety and ease. Core strengthening on mat was limited by LBP, thus discontinued. Patient was able to demonstrate good technique with transfers onto mat with cueing and only 1 episode of min A required for LE management. Patient was able to demonstrate some gait training without AD with antalgia on L LE. Ended session without complaints. Patient is progressing well  towards goals.   OBJECTIVE IMPAIRMENTS: Abnormal gait, decreased balance, decreased mobility, difficulty walking, decreased strength, and postural dysfunction.   ACTIVITY LIMITATIONS: lifting, bending, standing, squatting, transfers, bed mobility, toileting, hygiene/grooming, and locomotion level  PARTICIPATION LIMITATIONS: meal prep, cleaning, laundry, driving, shopping, and community activity  PERSONAL FACTORS: Time since onset of injury/illness/exacerbation and 3+ comorbidities: see above for PMH  are also affecting patient's functional outcome.   REHAB POTENTIAL: Good  CLINICAL DECISION MAKING: Evolving/moderate complexity  EVALUATION COMPLEXITY: Moderate  PLAN:  PT FREQUENCY: 2x/week  PT DURATION: 8 weeks plus 1x/wk (during eval week)-9 week POC  PLANNED INTERVENTIONS: Therapeutic exercises, Therapeutic activity, Neuromuscular re-education, Balance training, Gait training, Patient/Family education, and Self Care  PLAN FOR NEXT  SESSION: Addition of large amplitude weight shift activities.  Continue to work on hip stability, single limb stance, step length, lower extremity strengthening.   Janene Harvey, PT, DPT 10/29/22 3:32 PM  [facility] [address] [place], [place], [place] [address] Phone # [phone] Fax # [phone]

## 2022-10-29 ENCOUNTER — Ambulatory Visit: Payer: Medicare Other | Admitting: Physical Therapy

## 2022-10-29 ENCOUNTER — Encounter: Payer: Self-pay | Admitting: Physical Therapy

## 2022-10-29 DIAGNOSIS — R2689 Other abnormalities of gait and mobility: Secondary | ICD-10-CM

## 2022-10-29 DIAGNOSIS — R471 Dysarthria and anarthria: Secondary | ICD-10-CM | POA: Diagnosis not present

## 2022-10-29 DIAGNOSIS — R4701 Aphasia: Secondary | ICD-10-CM | POA: Diagnosis not present

## 2022-10-29 DIAGNOSIS — M6281 Muscle weakness (generalized): Secondary | ICD-10-CM | POA: Diagnosis not present

## 2022-10-29 DIAGNOSIS — G8929 Other chronic pain: Secondary | ICD-10-CM | POA: Diagnosis not present

## 2022-10-29 DIAGNOSIS — R2681 Unsteadiness on feet: Secondary | ICD-10-CM

## 2022-10-29 DIAGNOSIS — I69354 Hemiplegia and hemiparesis following cerebral infarction affecting left non-dominant side: Secondary | ICD-10-CM | POA: Diagnosis not present

## 2022-10-29 DIAGNOSIS — R293 Abnormal posture: Secondary | ICD-10-CM | POA: Diagnosis not present

## 2022-10-29 DIAGNOSIS — M25511 Pain in right shoulder: Secondary | ICD-10-CM | POA: Diagnosis not present

## 2022-10-31 DIAGNOSIS — R1310 Dysphagia, unspecified: Secondary | ICD-10-CM | POA: Diagnosis not present

## 2022-10-31 DIAGNOSIS — I6381 Other cerebral infarction due to occlusion or stenosis of small artery: Secondary | ICD-10-CM | POA: Diagnosis not present

## 2022-11-04 ENCOUNTER — Ambulatory Visit: Payer: Medicare Other

## 2022-11-04 ENCOUNTER — Ambulatory Visit: Payer: Medicare Other | Attending: Physical Medicine and Rehabilitation

## 2022-11-04 ENCOUNTER — Ambulatory Visit: Payer: Medicare Other | Admitting: Occupational Therapy

## 2022-11-04 DIAGNOSIS — R471 Dysarthria and anarthria: Secondary | ICD-10-CM | POA: Insufficient documentation

## 2022-11-04 DIAGNOSIS — M6281 Muscle weakness (generalized): Secondary | ICD-10-CM

## 2022-11-04 DIAGNOSIS — R4701 Aphasia: Secondary | ICD-10-CM

## 2022-11-04 DIAGNOSIS — R293 Abnormal posture: Secondary | ICD-10-CM

## 2022-11-04 DIAGNOSIS — R41841 Cognitive communication deficit: Secondary | ICD-10-CM | POA: Diagnosis present

## 2022-11-04 DIAGNOSIS — I69354 Hemiplegia and hemiparesis following cerebral infarction affecting left non-dominant side: Secondary | ICD-10-CM | POA: Diagnosis not present

## 2022-11-04 DIAGNOSIS — R2689 Other abnormalities of gait and mobility: Secondary | ICD-10-CM

## 2022-11-04 DIAGNOSIS — R2681 Unsteadiness on feet: Secondary | ICD-10-CM

## 2022-11-04 NOTE — Therapy (Signed)
OUTPATIENT SPEECH LANGUAGE PATHOLOGY TREATMENT   Patient Name: Jessica [name] MRN: [id number] DOB:08/23/1941, 82 y.o., female Today's Date: 11/04/2022  PCP: [name], MD  REFERRING PROVIDER: Heywood Iles, MD  END OF SESSION:  End of Session - 11/04/22 2131     Visit Number 3    Number of Visits 25    Date for SLP Re-Evaluation 01/11/23    SLP Start Time 44    SLP Stop Time  1100    SLP Time Calculation (min) 40 min    Activity Tolerance Patient limited by lethargy               Past Medical History:  Diagnosis Date   Allergy    Anemia    Anxiety    Asthma    in past, no inhalers now   Blood transfusion without reported diagnosis    Breast cancer ([place]) 01/2017   left breast    Breast cancer of upper-inner quadrant of left female breast ([place]) 03/11/2017   Cancer ([place]) 01/2017   left breast   Cataract    bilateral removed   Chronic kidney disease    kidney stones   Depression 08/31/2013   Dyslipidemia, goal LDL below 160 08/31/2013   Essential hypertension - well controlled 08/31/2013   Family history of breast cancer    Family history of melanoma    Family history of prostate cancer    GERD (gastroesophageal reflux disease)    Goals of care, counseling/discussion 03/11/2017   History of radiation therapy 10/13/17-11/11/17   left breast 40.05 Gy in 15 fractions, left breast boost 10 Gy in 5 fractions   Hypothyroidism    Ischemic stroke [facility])    May 2023;   Obesity (BMI 30-39.9) 08/31/2013   Personal history of chemotherapy 2018   Personal history of radiation therapy 2019   Thyroid disease    Vasovagal near-syncope -rare 08/31/2013   Past Surgical History:  Procedure Laterality Date   ABDOMINAL HYSTERECTOMY     total   BREAST BIOPSY Left 02/24/2017    malignant   BREAST LUMPECTOMY Left 03/23/2017   broken fingers     CATARACT EXTRACTION Bilateral    COLONOSCOPY     COLONOSCOPY WITH PROPOFOL N/A 06/03/2022   Procedure: COLONOSCOPY  WITH PROPOFOL;  Surgeon: [name], MD;  Location: [facility];  Service: Gastroenterology;  Laterality: N/A;   ESOPHAGOGASTRODUODENOSCOPY (EGD) WITH PROPOFOL N/A 05/29/2022   Procedure: ESOPHAGOGASTRODUODENOSCOPY (EGD) WITH PROPOFOL;  Surgeon: Jerene Bears, MD;  Location: [facility];  Service: Gastroenterology;  Laterality: N/A;   EXCISIONAL HEMORRHOIDECTOMY     IR FLUORO GUIDE PORT INSERTION RIGHT  08/04/2017   IR REMOVAL TUN ACCESS W/ PORT W/O FL MOD SED  08/04/2017   IR US GUIDE VASC ACCESS RIGHT  08/04/2017   IRRIGATION AND DEBRIDEMENT ABSCESS Left 05/18/2017   Procedure: IRRIGATION AND DEBRIDEMENT LEFT AXILLARY ABSCESS;  Surgeon: Michael Boston, MD;  Location: [facility];  Service: General;  Laterality: Left;   kidney stones lithotripsy     POLYPECTOMY  06/03/2022   Procedure: POLYPECTOMY;  Surgeon: [name], MD;  Location: [facility];  Service: Gastroenterology;;   PORTACATH PLACEMENT Right 03/23/2017   Procedure: INSERTION PORT-A-CATH;  Surgeon: Erroll Luna, MD;  Location: [place];  Service: General;  Laterality: Right;   RADIOACTIVE SEED GUIDED PARTIAL MASTECTOMY WITH AXILLARY SENTINEL LYMPH NODE BIOPSY Left 03/23/2017   Procedure: LEFT BREAST RADIOACTIVE SEED GUIDED PARTIAL MASTECTOMY AND  SENTINEL LYMPH NODE [place];  Surgeon: Erroll Luna, MD;  Location: [place];  Service: General;  Laterality: Left;   ROTATOR CUFF REPAIR     left    TONSILLECTOMY     WISDOM TOOTH EXTRACTION     Patient Active Problem List   Diagnosis Date Noted   Altered mental status 06/11/2022   Acute encephalopathy 06/10/2022   UTI (urinary tract infection) 06/10/2022   Constipation    History of stroke in adulthood 06/05/2022   Heme positive stool    Iron deficiency anemia due to chronic blood loss    Left thalamic infarction ([place]) 03/23/2022   Acquired hammer toe of right foot 08/19/2021   Adverse reaction to ACE-I (angiotensin-converting enzyme  inhibitor) 08/19/2021   Allergic rhinitis 08/19/2021   Anxiety 08/19/2021   Asthma 08/19/2021   Cardiomegaly 08/19/2021   Dependence on other enabling machines and devices 08/19/2021   History of adenomatous polyp of colon 08/19/2021   History of pneumonia 08/19/2021   Internal hemorrhoid 08/19/2021   Iron deficiency anemia secondary to inadequate dietary iron intake 08/19/2021   Osteopenia 08/19/2021   Personal history of malignant neoplasm of breast 08/19/2021   Somnolence 08/19/2021   Vitamin D deficiency 08/19/2021   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   Insomnia 06/25/2019   Obstructive sleep apnea 06/13/2019   Genetic testing 06/29/2017   Family history of melanoma    Family history of prostate cancer    Family history of breast cancer    Angioedema 05/26/2017   Left axillary seroma status post incision and drainage 05/18/2017 05/18/2017   Cellulitis of left axilla 05/17/2017   GERD (gastroesophageal reflux disease) 05/16/2017   Acquired hypothyroidism 05/16/2017   Depression with anxiety 05/16/2017   Breast cancer of upper-inner quadrant of left female breast ([place]) 03/11/2017   Goals of care, counseling/discussion 03/11/2017   Obesity (BMI 30-39.9) 08/31/2013   Essential hypertension - well controlled 08/31/2013    Class: Diagnosis of   Depression 08/31/2013    Class: Diagnosis of   Vasovagal near-syncope -rare 08/31/2013    Class: History of   Dyslipidemia, goal LDL below 160 08/31/2013    ONSET DATE: May 2023   REFERRING DIAG: I63.50 (ICD-10-CM) - Cerebrovascular accident (CVA) due to occlusion of cerebral artery ([place])  THERAPY DIAG:  Dysarthria and anarthria  Aphasia  Rationale for Evaluation and Treatment: Rehabilitation  SUBJECTIVE:   SUBJECTIVE STATEMENT: Pt very flat affect today. Pt accompanied by: family member - daughter [name]  PERTINENT HISTORY:  Presented 03/20/2022 with right-sided weakness slurred speech and altered mental status with right  facial droop of acute onset.  CT/MRI showed small focus of acute ischemia within the left caudate body.  No hemorrhage or mass effect.  Old right basal ganglia small vessel infarct and findings of chronic microvascular disease.  Patient did not receive tPA.  CT angiogram head and neck no emergent large vessel occlusion.  Echocardiogram ejection fraction of 60 to 65% no wall motion abnormalities.  Pt was admitted to [facility] on 05/28/2022 with suspected acute lower gastrointestinal bleed complicated by acute blood loss anemia after presenting from home to [facility] ED complaining of low hemoglobin per outpatient labs.  In September 2023 Adeleine was brought by her daughters after a fall at home. She was leaning over to pick up a piece of paper and lost her balance hitting her nose and L cheek on the ground. Unsure if she also hit her head. Did not have LOC. Not on a blood thinner. No other reports  of injuries.  She rec'd HHST, which, according to daughter focused mostly on swallowing.  PAIN:  Are you having pain? No  FALLS: Has patient fallen in last 6 months?  Yes See PT eval.  LIVING ENVIRONMENT: Lives with: lives with their family Lives in: House/apartment  PLOF:  Level of assistance: Independent with ADLs Employment: Retired  PATIENT GOALS: Improve memory  OBJECTIVE:   DIAGNOSTIC FINDINGS:  07/07/22 - IMPRESSION: 1. No acute intracranial abnormality. No skull fracture. 2. Stable atrophy, chronic small vessel ischemia, and remote bilateral basal gangliar lacunar infarcts.  PATIENT REPORTED OUTCOME MEASURES (PROM): Cognitive Function:   and Communication Participation Item Bank: to be provided in first 1-2 sessions   TODAY'S TREATMENT:                                                                                                                                         DATE:  11/04/22: Pt did not have opportunity to use table tent as [name] had family emergency and she could not  communicate info to [facility] to put into practice. SLP encouraged this to occur soon. [name] brought a 3 ring binder with notebook paper, a loose calendar with a pen, and all therapy papers in a pocket. SLP had pt pt write tabs ST PT and OT - pt wrote 2/3 legibly. She organized with mod A (put OT HEP with her PT HEP), but separated ST papers correctly. SLP provided a calendar of January 2024 and suggested that [name] write the therapies in the dates and times to which Asiya tells her. SLP highlighted that we want Addison to do as much as she can safely do - due to entries on the loose calendar being ~90% illegible.  SLP strongly suggested [name] attend a therapy session for carryover of OT and PT exercises. SLP req'd to ask Jolee to repeat 25% of her utterances. SLP not convinced pt desires ST after today's session. SLP to cont to develop rapport with pt and highlight benefits of ST to pt's rehab journey. Memory strategies to be discussed next session as SLP targeted memory notebook/system today as daughter indicated it was brought to ST.  10/16/22: Will discuss memory compensations further next session. Discussed pt's medication and timing with pt today. Landis told SLP incorrect schedule apart from mealtimes and bedtime. [name] assisted. SLP suggested table tent for med reminder to tell Judeen Hammans it was time for medication administration. Pt shared she does not like taste of Potassium and feeds it to the dog. SLP assisted problem solving with pt and daughter about how to achieve goal of taking Potassium and also having alternative to taste (brand with heavier coating). SLP suggested calendar for pt to view appointments - dtr to buy calendar and put appointments on it.  10/13/22: SLP discussed findings so far in evaluation. SLP will need to perform further assessment once rapport is developed with pt as she  did not freely offer much information today SLP suspects due to reason that she does not desire tx for  dysarthria. During evaluation pt stated she has trouble with memory and so SLP assessed memory. SLP hopes to eventually, as pt warms to him and ST in general, target pt's soft conversational speech.  PATIENT EDUCATION: Education details: as above in "today's treatment" Person educated: Patient and Child(ren) Education method: Explanation Education comprehension: verbalized understanding and needs further education   GOALS: Goals reviewed with patient? Yes  SHORT TERM GOALS: Target date: 11-20-22  Pt will bring memory compensation device/system to 60% of ST sessions Baseline: (1/1) Goal status: Ongoing  2.  Pt will indicate knowledge she can use memory system for functional tasks (I.e., will open/acess system when asked her therapy schedule) x 3 sessions Baseline:  Goal status: Ongoing  3.  Undergo more in depth cognitive communication testing in first 4 sessions Baseline:  Goal status: Ongoing  4.  Keziyah will indicate she has told daughter to administer her meds for 7 consecutive days Baseline:  Goal status: Ongoing  5.  TBD PRN Baseline:  Goal status: Initial   LONG TERM GOALS: Target date: 01/10/23  Charlea will track appointments and other social events using compensations in 5 sessions Baseline:  Goal status: Ongoing  2.  Shloka will indicate she has administered her meds correctly with SBA for 7 consecutive days Baseline:  Goal status: Ongoing  3.  Loribeth will indicate she feels more confident talking than initial sessions by improved PRM score/s Baseline:  Goal status: Ongoing  4.  TBD PRN Baseline:  Goal status: INITIAL   ASSESSMENT:  CLINICAL IMPRESSION: Patient is a 82 y.o. female who was seen today for treatment of memory in language. Discussion on memory compensation system initiated today with pt's memory book. Voice volume cont very low.   OBJECTIVE IMPAIRMENTS: include attention, memory, aphasia, and dysarthria. These impairments are limiting  patient from managing medications, managing appointments, managing finances, household responsibilities, ADLs/IADLs, and effectively communicating at home and in community. Factors affecting potential to achieve goals and functional outcome are ability to learn/carryover information and cooperation/participation level.. Patient will benefit from skilled SLP services to address above impairments and improve overall function.  REHAB POTENTIAL: Fair given pt's motivation/response to SLP about desire for ST today during eval  PLAN:  SLP FREQUENCY: 2x/week  SLP DURATION: 12 weeks  PLANNED INTERVENTIONS: Language facilitation, Environmental controls, Cueing hierachy, Cognitive reorganization, Internal/external aids, Oral motor exercises, Functional tasks, Multimodal communication approach, SLP instruction and feedback, Compensatory strategies, and Patient/family education    [facility], [place] 11/04/2022, 9:32 PM

## 2022-11-04 NOTE — Patient Instructions (Signed)

## 2022-11-04 NOTE — Therapy (Signed)
OUTPATIENT PHYSICAL THERAPY NEURO TREATMENT   Patient Name: Jessica [name] MRN: [id number] DOB:[birth year], 82 y.o., female Today's Date: 11/04/2022   PCP: [name], MD REFERRING PROVIDER: Izora Ribas, MD  END OF SESSION:  PT End of Session - 11/04/22 1118     Visit Number 7    Number of Visits 17    Date for PT Re-Evaluation 12/11/22    Authorization Type [employer] Medicare    PT Start Time 1110    PT Stop Time 1145    PT Time Calculation (min) 35 min    Equipment Utilized During Treatment Gait belt    Activity Tolerance Patient tolerated treatment well    Behavior During Therapy Flat affect               Past Medical History:  Diagnosis Date   Allergy    Anemia    Anxiety    Asthma    in past, no inhalers now   Blood transfusion without reported diagnosis    Breast cancer ([place]) 01/2017   left breast    Breast cancer of upper-inner quadrant of left female breast ([place]) 03/11/2017   Cancer ([place]) 01/2017   left breast   Cataract    bilateral removed   Chronic kidney disease    kidney stones   Depression 08/31/2013   Dyslipidemia, goal LDL below 160 08/31/2013   Essential hypertension - well controlled 08/31/2013   Family history of breast cancer    Family history of melanoma    Family history of prostate cancer    GERD (gastroesophageal reflux disease)    Goals of care, counseling/discussion 03/11/2017   History of radiation therapy 10/13/17-11/11/17   left breast 40.05 Gy in 15 fractions, left breast boost 10 Gy in 5 fractions   Hypothyroidism    Ischemic stroke [facility])    May 2023;   Obesity (BMI 30-39.9) 08/31/2013   Personal history of chemotherapy 2018   Personal history of radiation therapy 2019   Thyroid disease    Vasovagal near-syncope -rare 08/31/2013   Past Surgical History:  Procedure Laterality Date   ABDOMINAL HYSTERECTOMY     total   BREAST BIOPSY Left 02/24/2017    malignant   BREAST LUMPECTOMY Left 03/23/2017   broken  fingers     CATARACT EXTRACTION Bilateral    COLONOSCOPY     COLONOSCOPY WITH PROPOFOL N/A 06/03/2022   Procedure: COLONOSCOPY WITH PROPOFOL;  Surgeon: [name], MD;  Location: [facility];  Service: Gastroenterology;  Laterality: N/A;   ESOPHAGOGASTRODUODENOSCOPY (EGD) WITH PROPOFOL N/A 05/29/2022   Procedure: ESOPHAGOGASTRODUODENOSCOPY (EGD) WITH PROPOFOL;  Surgeon: Jerene Bears, MD;  Location: [facility];  Service: Gastroenterology;  Laterality: N/A;   EXCISIONAL HEMORRHOIDECTOMY     IR FLUORO GUIDE PORT INSERTION RIGHT  08/04/2017   IR REMOVAL TUN ACCESS W/ PORT W/O FL MOD SED  08/04/2017   IR US GUIDE VASC ACCESS RIGHT  08/04/2017   IRRIGATION AND DEBRIDEMENT ABSCESS Left 05/18/2017   Procedure: IRRIGATION AND DEBRIDEMENT LEFT AXILLARY ABSCESS;  Surgeon: Michael Boston, MD;  Location: [facility];  Service: General;  Laterality: Left;   kidney stones lithotripsy     POLYPECTOMY  06/03/2022   Procedure: POLYPECTOMY;  Surgeon: [name], MD;  Location: [facility];  Service: Gastroenterology;;   Sol Passer PLACEMENT Right 03/23/2017   Procedure: INSERTION PORT-A-CATH;  Surgeon: Erroll Luna, MD;  Location: [place];  Service: General;  Laterality: Right;   RADIOACTIVE SEED GUIDED PARTIAL MASTECTOMY  WITH AXILLARY SENTINEL LYMPH NODE BIOPSY Left 03/23/2017   Procedure: LEFT BREAST RADIOACTIVE SEED GUIDED PARTIAL MASTECTOMY AND  SENTINEL LYMPH NODE MAPPING;  Surgeon: Erroll Luna, MD;  Location: [place];  Service: General;  Laterality: Left;   ROTATOR CUFF REPAIR     left    TONSILLECTOMY     WISDOM TOOTH EXTRACTION     Patient Active Problem List   Diagnosis Date Noted   Altered mental status 06/11/2022   Acute encephalopathy 06/10/2022   UTI (urinary tract infection) 06/10/2022   Constipation    History of stroke in adulthood 06/05/2022   Heme positive stool    Iron deficiency anemia due to chronic blood loss    Left thalamic  infarction ([place]) 03/23/2022   Acquired hammer toe of right foot 08/19/2021   Adverse reaction to ACE-I (angiotensin-converting enzyme inhibitor) 08/19/2021   Allergic rhinitis 08/19/2021   Anxiety 08/19/2021   Asthma 08/19/2021   Cardiomegaly 08/19/2021   Dependence on other enabling machines and devices 08/19/2021   History of adenomatous polyp of colon 08/19/2021   History of pneumonia 08/19/2021   Internal hemorrhoid 08/19/2021   Iron deficiency anemia secondary to inadequate dietary iron intake 08/19/2021   Osteopenia 08/19/2021   Personal history of malignant neoplasm of breast 08/19/2021   Somnolence 08/19/2021   Vitamin D deficiency 08/19/2021   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   Insomnia 06/25/2019   Obstructive sleep apnea 06/13/2019   Genetic testing 06/29/2017   Family history of melanoma    Family history of prostate cancer    Family history of breast cancer    Angioedema 05/26/2017   Left axillary seroma status post incision and drainage 05/18/2017 05/18/2017   Cellulitis of left axilla 05/17/2017   GERD (gastroesophageal reflux disease) 05/16/2017   Acquired hypothyroidism 05/16/2017   Depression with anxiety 05/16/2017   Breast cancer of upper-inner quadrant of left female breast ([place]) 03/11/2017   Goals of care, counseling/discussion 03/11/2017   Obesity (BMI 30-39.9) 08/31/2013   Essential hypertension - well controlled 08/31/2013    Class: Diagnosis of   Depression 08/31/2013    Class: Diagnosis of   Vasovagal near-syncope -rare 08/31/2013    Class: History of   Dyslipidemia, goal LDL below 160 08/31/2013    ONSET DATE: 10/05/2022  REFERRING DIAG: I63.50 (ICD-10-CM) - Cerebrovascular accident (CVA) due to occlusion of cerebral artery ([place])   THERAPY DIAG:  Unsteadiness on feet  Muscle weakness (generalized)  Other abnormalities of gait and mobility  Abnormal posture  Rationale for Evaluation and Treatment: Rehabilitation  SUBJECTIVE:  SUBJECTIVE STATEMENT: No new issues  Pt accompanied by: family member, 3 daughters  PERTINENT HISTORY: HTN, HLD, hypothyroidism, asthma, depression, anxiety, L breast cancer post-mastectomy 2018, obesity; 03/20/22 with CVA R sided weakness  PAIN:  Are you having pain? Yes: NPRS scale: 0/10 Pain location: shoulder Pain description: aching Aggravating factors: sleeping on it wrong Relieving factors: rubbing it *OT aware PRECAUTIONS: Fall  WEIGHT BEARING RESTRICTIONS: No  FALLS: Has patient fallen in last 6 months? Yes. Number of falls 1  LIVING ENVIRONMENT: Lives with: lives with their family and lives with their daughter Lives in: House/apartment Stairs: No Has following equipment at home: Environmental consultant - 2 wheeled, Environmental consultant - 4 wheeled, and shower chair  PLOF: Independent  PATIENT GOALS: To get back home, to get off the walker  OBJECTIVE:    TODAY'S TREATMENT: 11/04/22 Activity Comments  NU-step level 4 x 5 min Cues in speed  Gait training Around obstacles w/ 4ww  Balance at counter -feet together EO/EC 2x30 sec -semi tandem 2x30 sec -sidestepping x 60 sec -sit to stand to heel raise 2x10 -lateral shift and reach 1x10 (LUE limited due to shoulder ROM)              TODAY'S TREATMENT: 10/29/22 Activity Comments  Nustep Les only L3 x 6 min  Cueing to increase pace   STS 5x Cueing for increased scoot and trunk lean; CGA-min A  Sit<>supine from R and L side Slow and cueing for foot positioning; min A for LE management to get onto L side; otherwise only cueing provided   Bridge x10 Limited amplitude; cueing for proper positioning   Hooklying alt LE extension 10x  Cues for positioning and core contraction; c/o LBP- discontinued   figure 8 turns around cones With 4WW and without AD; cueing for longer step length,  particularly L>R      HOME EXERCISE PROGRAM Last updated: 10/27/22 Access Code: [email or web address] URL: [email or web address] Date: 10/27/2022 Prepared by: [facility]  Exercises - Sit to Stand with Counter Support  - 1 x daily - 7 x weekly - 3 sets - 5 reps 1. scoot bottom to edge of seat 2. tuck feet underneath you 3. lean nose over toes - Heel Raises with Counter Support  - 1 x daily - 7 x weekly - 3 sets - 10 reps - Side to Side Weight Shift with Overhead Reach and Counter Support  - 1 x daily - 7 x weekly - 3 sets - 10 reps - Staggered Stance Forward Backward Weight Shift with Counter Support  - 1 x daily - 7 x weekly - 3 sets - 10 reps - Alternating Step Taps with Counter Support  - 1 x daily - 5 x weekly - 3 sets - 10 reps - Side Stepping with Counter Support  - 1 x daily - 5 x weekly - 1 sets - 3 reps   ------------------------------------------------------------------------------------------------- Objective measures below taken at time of eval:  DIAGNOSTIC FINDINGS: MRI 06/11/22-no changes; 03/21/22 MRI:  acute ischemia within the L caudate body; old R basal ganglia small vessel infarct  COGNITION: Overall cognitive status:  Dysarthria per notes; flat affect   SENSATION: Light touch: WFL   POSTURE: rounded shoulders and forward head  LOWER EXTREMITY ROM:   WFL for BLEs  Active  Right Eval Left Eval  Hip flexion    Hip extension    Hip abduction    Hip adduction    Hip internal rotation  Hip external rotation    Knee flexion    Knee extension    Ankle dorsiflexion    Ankle plantarflexion    Ankle inversion    Ankle eversion     (Blank rows = not tested)  LOWER EXTREMITY MMT:    MMT Right Eval Left Eval  Hip flexion 3+ 3+  Hip extension    Hip abduction 3+ 3+  Hip adduction 3 3  Hip internal rotation    Hip external rotation    Knee flexion 4 3+  Knee extension 3+ 3+  Ankle dorsiflexion 3+ 3-  Ankle  plantarflexion    Ankle inversion    Ankle eversion    (Blank rows = not tested)  BED MOBILITY:  Requires assist of family, daughter reports providing 50% assistance  TRANSFERS: Assistive device utilized:  UE support to stand at locked rollator   Sit to stand: SBA with UE support Stand to sit: CGA with UE support   GAIT: Gait pattern: step through pattern, decreased step length- Right, decreased step length- Left, and poor foot clearance- Right Distance walked: 50 ft Assistive device utilized: Walker - 4 wheeled Level of assistance: CGA Comments: 10 M walk:  20.34 sec (1.61 ft/sec)  FUNCTIONAL TESTS:  5 times sit to stand: 41.35 sec with UE support Timed up and go (TUG): 45.90 sec with 4-wheeled RW Berg Balance Scale: 24/56 10 M walk:  1.61 ft/sec  with RW  PATIENT SURVEYS:  FOTO intake score 51; predicted score at discharge 53      PATIENT EDUCATION: Education details: Eval results, POC Person educated: Patient and Child(ren) Education method: Explanation Education comprehension: verbalized understanding    GOALS: Goals reviewed with patient? Yes  SHORT TERM GOALS: Target date: 11/13/2022  Pt will be supervision with HEP for improved strength, balance, gait. Baseline: Goal status: IN PROGRESS  2.  Pt will improve 5x sit<>stand to less than or equal to 30 sec to demonstrate improved functional strength and transfer efficiency.  Baseline: 41.35 sec Goal status: IN PROGRESS  3.  Pt will improve Berg score to at least 30/56 to decrease fall risk. Baseline: 24/56 Goal status: IN PROGRESS  4.  Pt will improve TUG score to less than or equal to 35 sec for decreased fall risk. Baseline: 45.9 sec Goal status: INITIAL  LONG TERM GOALS: Target date: 12/11/2022  Pt will be supervision with HEP for improved strength, balance, gait. Baseline:  Goal status: IN PROGRESS  2.  Pt will improve 5x sit<>stand to less than or equal to 20 sec to demonstrate improved  functional strength and transfer efficiency.  Baseline:  Goal status: IN PROGRESS  3.  Pt will improve Berg score to at least 40/56 to decrease fall risk. Baseline:  Goal status: IN PROGRESS  4.  Pt will improve TUG score to less than or equal to 20 sec for decreased fall risk. Baseline:  Goal status: IN PROGRESS  5.  Pt will improve gait velocity to at least 2 ft/sec for improved gait efficiency and safety. Baseline: 1.61 ft/sec Goal status: IN PROGRESS  ASSESSMENT:  CLINICAL IMPRESSION: Able to progress with POC details and demo improved static balance with narrow BOS and eyes closed condition demo mild-moderate sway. Fairly good activity tolerance requiring occasional therapeutic rest periods. Limited session due to running over with speech. Continued sessions to progress balance and mobility to improve gait performance.   OBJECTIVE IMPAIRMENTS: Abnormal gait, decreased balance, decreased mobility, difficulty walking, decreased strength, and postural dysfunction.  ACTIVITY LIMITATIONS: lifting, bending, standing, squatting, transfers, bed mobility, toileting, hygiene/grooming, and locomotion level  PARTICIPATION LIMITATIONS: meal prep, cleaning, laundry, driving, shopping, and community activity  PERSONAL FACTORS: Time since onset of injury/illness/exacerbation and 3+ comorbidities: see above for PMH  are also affecting patient's functional outcome.   REHAB POTENTIAL: Good  CLINICAL DECISION MAKING: Evolving/moderate complexity  EVALUATION COMPLEXITY: Moderate  PLAN:  PT FREQUENCY: 2x/week  PT DURATION: 8 weeks plus 1x/wk (during eval week)-9 week POC  PLANNED INTERVENTIONS: Therapeutic exercises, Therapeutic activity, Neuromuscular re-education, Balance training, Gait training, Patient/Family education, and Self Care  PLAN FOR NEXT SESSION:step over/around obstacles  11:46 AM, 11/04/22 M. Sherlyn Lees, PT, DPT Physical Therapist- [place] Office Number:  [phone]   [place] at [facility] [address], [place], [place] [address] Phone # [phone] Fax # [phone]

## 2022-11-04 NOTE — Therapy (Signed)
OUTPATIENT OCCUPATIONAL THERAPY  Treatment Note  Patient Name: Jessica [name] MRN: [id number] DOB:1941/02/27, 82 y.o., female Today's Date: 11/04/2022  PCP: [name], MD REFERRING PROVIDER: Izora Ribas, MD  END OF SESSION:  OT End of Session - 11/04/22 1347     Visit Number 4    Number of Visits 17    Date for OT Re-Evaluation 12/11/22    Authorization Type [employer]    OT Start Time 1152    OT Stop Time 1237    OT Time Calculation (min) 45 min                Past Medical History:  Diagnosis Date   Allergy    Anemia    Anxiety    Asthma    in past, no inhalers now   Blood transfusion without reported diagnosis    Breast cancer ([place]) 01/2017   left breast    Breast cancer of upper-inner quadrant of left female breast ([place]) 03/11/2017   Cancer ([place]) 01/2017   left breast   Cataract    bilateral removed   Chronic kidney disease    kidney stones   Depression 08/31/2013   Dyslipidemia, goal LDL below 160 08/31/2013   Essential hypertension - well controlled 08/31/2013   Family history of breast cancer    Family history of melanoma    Family history of prostate cancer    GERD (gastroesophageal reflux disease)    Goals of care, counseling/discussion 03/11/2017   History of radiation therapy 10/13/17-11/11/17   left breast 40.05 Gy in 15 fractions, left breast boost 10 Gy in 5 fractions   Hypothyroidism    Ischemic stroke [facility])    May 2023;   Obesity (BMI 30-39.9) 08/31/2013   Personal history of chemotherapy 2018   Personal history of radiation therapy 2019   Thyroid disease    Vasovagal near-syncope -rare 08/31/2013   Past Surgical History:  Procedure Laterality Date   ABDOMINAL HYSTERECTOMY     total   BREAST BIOPSY Left 02/24/2017    malignant   BREAST LUMPECTOMY Left 03/23/2017   broken fingers     CATARACT EXTRACTION Bilateral    COLONOSCOPY     COLONOSCOPY WITH PROPOFOL N/A 06/03/2022   Procedure: COLONOSCOPY WITH PROPOFOL;   Surgeon: [name], MD;  Location: [facility];  Service: Gastroenterology;  Laterality: N/A;   ESOPHAGOGASTRODUODENOSCOPY (EGD) WITH PROPOFOL N/A 05/29/2022   Procedure: ESOPHAGOGASTRODUODENOSCOPY (EGD) WITH PROPOFOL;  Surgeon: Jerene Bears, MD;  Location: [facility];  Service: Gastroenterology;  Laterality: N/A;   EXCISIONAL HEMORRHOIDECTOMY     IR FLUORO GUIDE PORT INSERTION RIGHT  08/04/2017   IR REMOVAL TUN ACCESS W/ PORT W/O FL MOD SED  08/04/2017   IR US GUIDE VASC ACCESS RIGHT  08/04/2017   IRRIGATION AND DEBRIDEMENT ABSCESS Left 05/18/2017   Procedure: IRRIGATION AND DEBRIDEMENT LEFT AXILLARY ABSCESS;  Surgeon: Michael Boston, MD;  Location: [facility];  Service: General;  Laterality: Left;   kidney stones lithotripsy     POLYPECTOMY  06/03/2022   Procedure: POLYPECTOMY;  Surgeon: [name], MD;  Location: [facility];  Service: Gastroenterology;;   PORTACATH PLACEMENT Right 03/23/2017   Procedure: INSERTION PORT-A-CATH;  Surgeon: Erroll Luna, MD;  Location: [place];  Service: General;  Laterality: Right;   RADIOACTIVE SEED GUIDED PARTIAL MASTECTOMY WITH AXILLARY SENTINEL LYMPH NODE BIOPSY Left 03/23/2017   Procedure: LEFT BREAST RADIOACTIVE SEED GUIDED PARTIAL MASTECTOMY AND  SENTINEL LYMPH NODE [place];  Surgeon:  Erroll Luna, MD;  Location: [place];  Service: General;  Laterality: Left;   ROTATOR CUFF REPAIR     left    TONSILLECTOMY     WISDOM TOOTH EXTRACTION     Patient Active Problem List   Diagnosis Date Noted   Altered mental status 06/11/2022   Acute encephalopathy 06/10/2022   UTI (urinary tract infection) 06/10/2022   Constipation    History of stroke in adulthood 06/05/2022   Heme positive stool    Iron deficiency anemia due to chronic blood loss    Left thalamic infarction ([place]) 03/23/2022   Acquired hammer toe of right foot 08/19/2021   Adverse reaction to ACE-I (angiotensin-converting enzyme inhibitor)  08/19/2021   Allergic rhinitis 08/19/2021   Anxiety 08/19/2021   Asthma 08/19/2021   Cardiomegaly 08/19/2021   Dependence on other enabling machines and devices 08/19/2021   History of adenomatous polyp of colon 08/19/2021   History of pneumonia 08/19/2021   Internal hemorrhoid 08/19/2021   Iron deficiency anemia secondary to inadequate dietary iron intake 08/19/2021   Osteopenia 08/19/2021   Personal history of malignant neoplasm of breast 08/19/2021   Somnolence 08/19/2021   Vitamin D deficiency 08/19/2021   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   Insomnia 06/25/2019   Obstructive sleep apnea 06/13/2019   Genetic testing 06/29/2017   Family history of melanoma    Family history of prostate cancer    Family history of breast cancer    Angioedema 05/26/2017   Left axillary seroma status post incision and drainage 05/18/2017 05/18/2017   Cellulitis of left axilla 05/17/2017   GERD (gastroesophageal reflux disease) 05/16/2017   Acquired hypothyroidism 05/16/2017   Depression with anxiety 05/16/2017   Breast cancer of upper-inner quadrant of left female breast ([place]) 03/11/2017   Goals of care, counseling/discussion 03/11/2017   Obesity (BMI 30-39.9) 08/31/2013   Essential hypertension - well controlled 08/31/2013    Class: Diagnosis of   Depression 08/31/2013    Class: Diagnosis of   Vasovagal near-syncope -rare 08/31/2013    Class: History of   Dyslipidemia, goal LDL below 160 08/31/2013    ONSET DATE: 03/20/22  REFERRING DIAG: I63.50 (ICD-10-CM) - Cerebrovascular accident (CVA) due to occlusion of cerebral artery  THERAPY DIAG:  Unsteadiness on feet  Muscle weakness (generalized)  Rationale for Evaluation and Treatment: Rehabilitation  SUBJECTIVE:   SUBJECTIVE STATEMENT: Pt and daughter report that pt had a fall in the bathroom Saturday evening. Pt accompanied by: self and family member (daughter, [name])  PERTINENT HISTORY: Past medical history of triple negative stage  IIa ductal carcinoma of the left breast cancer s/p partial mastectomy 2018, HLD, HTN, CVA May 2023 with R residual deficits and expressive aphaisa.  PRECAUTIONS: Shoulder  WEIGHT BEARING RESTRICTIONS: No  PAIN:  Are you having pain? Yes: NPRS scale: 3/10 Pain location: R shoulder Pain description: sore Aggravating factors: overhead movement Relieving factors: rest  FALLS: Has patient fallen in last 6 months? Yes. Number of falls 1 fall (and another near fall)  LIVING ENVIRONMENT: Lives with: lives with their family (living with daughter Judeen Hammans since [place]) Lives in: House/apartment Stairs: Yes: External: 1 high brick steps; none Has following equipment at home: Lobbyist, Environmental consultant - 2 wheeled, Environmental consultant - 4 wheeled, shower chair, and Grab bars  PLOF: Independent  PATIENT GOALS: to be like I was before  OBJECTIVE:   HAND DOMINANCE: Right  ADLs: Overall ADLs: aide assists with majority of bathing/dressing Transfers/ambulation related to ADLs: Supervision with RW  in home, Rollator in community Eating: difficulty with cutting foods, spilling food off utensil due to inconsistent tremor like movements Grooming: Supervision UB Dressing: Max assist LB Dressing: Max assist Toileting: assist to pull up pants after toileting Bathing: Mod assist Tub Shower transfers: Min - HHA with transfers in/out of tub/shower Equipment: Shower seat with back and Grab bars  IADLs: Meal Prep: will occasionally pour cereal or fix oatmeal, aide does majority of other meal tasks Medication management: [facility] prepares packages of meds as prescribed and caregiver hands them to pt to take  UPPER EXTREMITY ROM:    Active ROM Right eval Left eval  Shoulder flexion 84 (h/o shoulder injury and surgery) 95  Shoulder abduction    Shoulder adduction    Shoulder extension    Shoulder internal rotation    Shoulder external rotation    Elbow flexion 80*   Elbow extension -10*   Wrist flexion     Wrist extension    Wrist ulnar deviation    Wrist radial deviation    Wrist pronation    Wrist supination    (Blank rows = not tested)  UPPER EXTREMITY MMT:   not tested due to pain with AROM and attempts at PROM   COORDINATION: 9 Hole Peg test: Right: 53.41 sec; Left: 58.41 sec Box and Blocks:  Right 21 blocks, Left 23 blocks   OBSERVATIONS: Pt with flat affect and somewhat withdrawn.  [name] daughter reports that pt was able to complete ADLs at higher level after leaving [facility] but has allowed aide to assist with ADLs and provide hand held assist with transitional movements.   TODAY'S TREATMENT:                                                                        11/04/22 Self-care: engaged in length conversation about circumstances surrounding fall in bathroom.  OT identifying fall risks during particular situation. OT educated on fall risk and suggestions to decrease fall risk, such as ensuring appropriate footwear, no pants under feet, removal of rugs, proper hand placement with sit > stand, question cues from caregivers prior to physical assistance, etc.  Pt reports that she feels like her daughters are always telling her what she is doing wrong, encouraged positive reinforcement as well as pt to assess situation prior to standing.      Transitional movements: engaged in sit > stand from various height surfaces and with various hand placements to increase awareness of positioning and allow for sequencing.  On one instance, pt initially placed B hands on Rollator but corrected hand placement prior to sit > stand.  Pt demonstrating good technique and awareness with transitional movements this session.    10/19/22 LB dressing: massed practice with threading BLE and pulling pants up/down over hips with use of gait belt to simulate pants.  OT providing min cues and allowing increased time for pt to problem solve challenges.  Pt able to recall technique from previous session with min cues  to increase reach in back.  Pt demonstrating improved ease with sit > stand throughout session. ADL: pt's caregiver present during session and reporting that she did not help her with clothing management during toileting tasks this session, but continues to provide  supervision.  Pt reports that she feels that one of her daughters helped her too much in the bathroom.  Engaged in discussion about providing verbal cues to aid pt initially before physically assisting with clothing management.  Also discussed decreasing physical assistance while still providing supervision to allow pt to feel increased independence. UB dressing: engaged in bag exercise with use of BUE to advance bag over head and back to simulate donning shirt.  Pt demonstrating decreased shoulder flexion Bilaterally, L > R.  OT providing demonstration of technique and hand placement to facilitate increased ease and ROM with UB dressing to include forward reach to allow for increased shoulder flexion.   10/16/22 AE: OT provided education and demonstration on use of AE, particularly reacher, with LB dressing.  However with massed practice of other strategies, no longer recommending use of reach at this time. LB dressing.  Massed practice with threading BLE and pulling pants up/down over hips with use of gait belt to simulate pants.  OT providing mod cues, demonstration, and hand over hand assist for improved R hand placement when attempting to pull "waist band" over hips and in back.  OT providing cues for hand placement and weight shifting for sit > stand during simulated LB dressing task.  CGA to close assistance during sit > stand and dynamic standing balance.  Pt requiring increased time and encouragement to continue to attempt task.   PATIENT EDUCATION: Education details: fall risk, and safety awareness to reduce falls Person educated: Patient and Child(ren) Education method: Customer service manager Education comprehension:  verbalized understanding and needs further education  HOME EXERCISE PROGRAM: TBD   GOALS: Goals reviewed with patient? Yes  SHORT TERM GOALS: Target date: 11/13/22  Pt  and caregiver will verbalize understanding of task modifications and/or potential AE needs to increase ease, safety, and independence w/ ADLs. Baseline: Goal status: IN PROGRESS  2.  Pt will complete toilet transfer with supervision and no verbal cues 5 out of 6 attempts to demonstrate improved mobility and progress to decreasing level of supervision. Baseline: currently CGA to min assist Goal status: IN PROGRESS  3.  Pt will be able to manage clothing up/down for toileting with distant supervision to progress to increased independence with toileting tasks. Baseline: currently min-mod assist Goal status: IN PROGRESS  4.  Pt will be able to complete UB dressing with supervision assist (with exception of bra). Baseline: currently max assist Goal status: IN PROGRESS  LONG TERM GOALS: Target date: 12/11/22  Pt will be able to complete LB dressing with supervision, to include donning socks and shoes. Baseline: currently max assist Goal status: IN PROGRESS  2.  Pt will be able to don bra at Mod I level. Baseline: unable to complete Goal status: IN PROGRESS  3.  Pt will be able to complete all aspects of toileting (transfer, clothing management, and hygiene) at Mod I level with LRAD. Baseline: Min-mod assist Goal status: IN PROGRESS  4.  Pt will be able to maintain dynamic standing balance for 10 mins w/o LOB using DME and/or countertop support prn to increase ease, safety, and independence with engagement in meal prep tasks. Baseline:  Goal status: IN PROGRESS  5.  Patient will demonstrate sufficient range of motion and activity tolerance to use BUE to shampoo her hair in the shower  Baseline: RUE 84* and LUE 95* shoulder ROM Goal status: IN PROGRESS   ASSESSMENT:  CLINICAL IMPRESSION: Pt continues to  report feeling that her family and caregiver provide  inconsistent styles of assistance.  Pt receptive to education on fall risk and instances that may have precipitated her fall over the weekend.  OT continues to encourage decreased physical assistance from caregivers, but is recommending supervision with question cues at this time to promote increased sequencing and awareness for increased safety and independence, especially in regards to bathroom needs.    PERFORMANCE DEFICITS: in functional skills including ADLs, IADLs, coordination, ROM, strength, pain, flexibility, Gross motor control, balance, body mechanics, endurance, decreased knowledge of precautions, decreased knowledge of use of DME, and UE functional use, cognitive skills including emotional, energy/drive, problem solving, safety awareness, and sequencing, and psychosocial skills including coping strategies, environmental adaptation, habits, and routines and behaviors.   IMPAIRMENTS: are limiting patient from ADLs and IADLs.   CO-MORBIDITIES: may have co-morbidities  that affects occupational performance. Patient will benefit from skilled OT to address above impairments and improve overall function.  MODIFICATION OR ASSISTANCE TO COMPLETE EVALUATION: Min-Moderate modification of tasks or assist with assess necessary to complete an evaluation.  OT OCCUPATIONAL PROFILE AND HISTORY: Problem focused assessment: Including review of records relating to presenting problem.  CLINICAL DECISION MAKING: Moderate - several treatment options, min-mod task modification necessary  REHAB POTENTIAL: Good  EVALUATION COMPLEXITY: Moderate    PLAN:  OT FREQUENCY: 2x/week  OT DURATION: 8 weeks  PLANNED INTERVENTIONS: self care/ADL training, therapeutic exercise, therapeutic activity, neuromuscular re-education, manual therapy, passive range of motion, balance training, functional mobility training, ultrasound, compression bandaging, moist heat,  cryotherapy, cognitive remediation/compensation, psychosocial skills training, energy conservation, coping strategies training, and DME and/or AE instructions  RECOMMENDED OTHER SERVICES: NA  CONSULTED AND AGREED WITH PLAN OF CARE: Patient and family member/caregiver  PLAN FOR NEXT SESSION: Reiterate sit > stand and pulling pants up/down, short distance ambulatory transfers as needed to increase independence with toileting.  UB ROM exercises for increased ease, safety, independence with UB dressing.   Simonne Come, OTR/L 11/04/2022, 1:48 PM

## 2022-11-06 ENCOUNTER — Ambulatory Visit: Payer: Medicare Other | Admitting: Physical Therapy

## 2022-11-06 ENCOUNTER — Ambulatory Visit: Payer: Medicare Other | Admitting: Occupational Therapy

## 2022-11-06 ENCOUNTER — Ambulatory Visit: Payer: Medicare Other

## 2022-11-09 ENCOUNTER — Ambulatory Visit: Payer: Medicare Other | Admitting: Occupational Therapy

## 2022-11-09 ENCOUNTER — Ambulatory Visit: Payer: Medicare Other

## 2022-11-09 ENCOUNTER — Ambulatory Visit: Payer: Medicare Other | Admitting: Physical Therapy

## 2022-11-09 ENCOUNTER — Encounter: Payer: Self-pay | Admitting: Physical Therapy

## 2022-11-09 DIAGNOSIS — R471 Dysarthria and anarthria: Secondary | ICD-10-CM

## 2022-11-09 DIAGNOSIS — R2681 Unsteadiness on feet: Secondary | ICD-10-CM

## 2022-11-09 DIAGNOSIS — M6281 Muscle weakness (generalized): Secondary | ICD-10-CM | POA: Diagnosis not present

## 2022-11-09 DIAGNOSIS — R41841 Cognitive communication deficit: Secondary | ICD-10-CM

## 2022-11-09 DIAGNOSIS — R2689 Other abnormalities of gait and mobility: Secondary | ICD-10-CM

## 2022-11-09 DIAGNOSIS — R4701 Aphasia: Secondary | ICD-10-CM | POA: Diagnosis not present

## 2022-11-09 DIAGNOSIS — I69354 Hemiplegia and hemiparesis following cerebral infarction affecting left non-dominant side: Secondary | ICD-10-CM | POA: Diagnosis not present

## 2022-11-09 DIAGNOSIS — R293 Abnormal posture: Secondary | ICD-10-CM | POA: Diagnosis not present

## 2022-11-09 NOTE — Therapy (Signed)
OUTPATIENT PHYSICAL THERAPY NEURO TREATMENT   Patient Name: Jessica [name] MRN: [id number] DOB:08-28-1941, 82 y.o., female Today's Date: 11/09/2022   PCP: [name], MD REFERRING PROVIDER: Izora Ribas, MD  END OF SESSION:  PT End of Session - 11/09/22 1531     Visit Number 8    Number of Visits 17    Date for PT Re-Evaluation 12/11/22    Authorization Type [employer] Medicare    PT Start Time 1535    PT Stop Time 2536    PT Time Calculation (min) 39 min    Equipment Utilized During Treatment Gait belt    Activity Tolerance Patient tolerated treatment well    Behavior During Therapy Flat affect                Past Medical History:  Diagnosis Date   Allergy    Anemia    Anxiety    Asthma    in past, no inhalers now   Blood transfusion without reported diagnosis    Breast cancer ([place]) 01/2017   left breast    Breast cancer of upper-inner quadrant of left female breast ([place]) 03/11/2017   Cancer ([place]) 01/2017   left breast   Cataract    bilateral removed   Chronic kidney disease    kidney stones   Depression 08/31/2013   Dyslipidemia, goal LDL below 160 08/31/2013   Essential hypertension - well controlled 08/31/2013   Family history of breast cancer    Family history of melanoma    Family history of prostate cancer    GERD (gastroesophageal reflux disease)    Goals of care, counseling/discussion 03/11/2017   History of radiation therapy 10/13/17-11/11/17   left breast 40.05 Gy in 15 fractions, left breast boost 10 Gy in 5 fractions   Hypothyroidism    Ischemic stroke [facility])    May 2023;   Obesity (BMI 30-39.9) 08/31/2013   Personal history of chemotherapy 2018   Personal history of radiation therapy 2019   Thyroid disease    Vasovagal near-syncope -rare 08/31/2013   Past Surgical History:  Procedure Laterality Date   ABDOMINAL HYSTERECTOMY     total   BREAST BIOPSY Left 02/24/2017    malignant   BREAST LUMPECTOMY Left 03/23/2017   broken  fingers     CATARACT EXTRACTION Bilateral    COLONOSCOPY     COLONOSCOPY WITH PROPOFOL N/A 06/03/2022   Procedure: COLONOSCOPY WITH PROPOFOL;  Surgeon: [name], MD;  Location: [facility];  Service: Gastroenterology;  Laterality: N/A;   ESOPHAGOGASTRODUODENOSCOPY (EGD) WITH PROPOFOL N/A 05/29/2022   Procedure: ESOPHAGOGASTRODUODENOSCOPY (EGD) WITH PROPOFOL;  Surgeon: Jerene Bears, MD;  Location: [facility];  Service: Gastroenterology;  Laterality: N/A;   EXCISIONAL HEMORRHOIDECTOMY     IR FLUORO GUIDE PORT INSERTION RIGHT  08/04/2017   IR REMOVAL TUN ACCESS W/ PORT W/O FL MOD SED  08/04/2017   IR US GUIDE VASC ACCESS RIGHT  08/04/2017   IRRIGATION AND DEBRIDEMENT ABSCESS Left 05/18/2017   Procedure: IRRIGATION AND DEBRIDEMENT LEFT AXILLARY ABSCESS;  Surgeon: Michael Boston, MD;  Location: [facility];  Service: General;  Laterality: Left;   kidney stones lithotripsy     POLYPECTOMY  06/03/2022   Procedure: POLYPECTOMY;  Surgeon: [name], MD;  Location: [facility];  Service: Gastroenterology;;   Sol Passer PLACEMENT Right 03/23/2017   Procedure: INSERTION PORT-A-CATH;  Surgeon: Erroll Luna, MD;  Location: [facility];  Service: General;  Laterality: Right;   RADIOACTIVE SEED GUIDED PARTIAL  MASTECTOMY WITH AXILLARY SENTINEL LYMPH NODE BIOPSY Left 03/23/2017   Procedure: LEFT BREAST RADIOACTIVE SEED GUIDED PARTIAL MASTECTOMY AND  SENTINEL LYMPH NODE MAPPING;  Surgeon: Erroll Luna, MD;  Location: [facility];  Service: General;  Laterality: Left;   ROTATOR CUFF REPAIR     left    TONSILLECTOMY     WISDOM TOOTH EXTRACTION     Patient Active Problem List   Diagnosis Date Noted   Altered mental status 06/11/2022   Acute encephalopathy 06/10/2022   UTI (urinary tract infection) 06/10/2022   Constipation    History of stroke in adulthood 06/05/2022   Heme positive stool    Iron deficiency anemia due to chronic blood loss    Left thalamic  infarction ([place]) 03/23/2022   Acquired hammer toe of right foot 08/19/2021   Adverse reaction to ACE-I (angiotensin-converting enzyme inhibitor) 08/19/2021   Allergic rhinitis 08/19/2021   Anxiety 08/19/2021   Asthma 08/19/2021   Cardiomegaly 08/19/2021   Dependence on other enabling machines and devices 08/19/2021   History of adenomatous polyp of colon 08/19/2021   History of pneumonia 08/19/2021   Internal hemorrhoid 08/19/2021   Iron deficiency anemia secondary to inadequate dietary iron intake 08/19/2021   Osteopenia 08/19/2021   Personal history of malignant neoplasm of breast 08/19/2021   Somnolence 08/19/2021   Vitamin D deficiency 08/19/2021   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   Insomnia 06/25/2019   Obstructive sleep apnea 06/13/2019   Genetic testing 06/29/2017   Family history of melanoma    Family history of prostate cancer    Family history of breast cancer    Angioedema 05/26/2017   Left axillary seroma status post incision and drainage 05/18/2017 05/18/2017   Cellulitis of left axilla 05/17/2017   GERD (gastroesophageal reflux disease) 05/16/2017   Acquired hypothyroidism 05/16/2017   Depression with anxiety 05/16/2017   Breast cancer of upper-inner quadrant of left female breast ([place]) 03/11/2017   Goals of care, counseling/discussion 03/11/2017   Obesity (BMI 30-39.9) 08/31/2013   Essential hypertension - well controlled 08/31/2013    Class: Diagnosis of   Depression 08/31/2013    Class: Diagnosis of   Vasovagal near-syncope -rare 08/31/2013    Class: History of   Dyslipidemia, goal LDL below 160 08/31/2013    ONSET DATE: 10/05/2022  REFERRING DIAG: I63.50 (ICD-10-CM) - Cerebrovascular accident (CVA) due to occlusion of cerebral artery ([place])   THERAPY DIAG:  Unsteadiness on feet  Muscle weakness (generalized)  Other abnormalities of gait and mobility  Rationale for Evaluation and Treatment: Rehabilitation  SUBJECTIVE:  SUBJECTIVE STATEMENT: Nothing new.  "Lots of things to remember"  Pt accompanied by: family member, 3 daughters  PERTINENT HISTORY: HTN, HLD, hypothyroidism, asthma, depression, anxiety, L breast cancer post-mastectomy 2018, obesity; 03/20/22 with CVA R sided weakness  PAIN:  Are you having pain? Yes: NPRS scale: 0/10 Pain location: shoulder Pain description: aching Aggravating factors: sleeping on it wrong Relieving factors: rubbing it *OT aware PRECAUTIONS: Fall  WEIGHT BEARING RESTRICTIONS: No  FALLS: Has patient fallen in last 6 months? Yes. Number of falls 1  LIVING ENVIRONMENT: Lives with: lives with their family and lives with their daughter Lives in: House/apartment Stairs: No Has following equipment at home: Environmental consultant - 2 wheeled, Environmental consultant - 4 wheeled, and shower chair  PLOF: Independent  PATIENT GOALS: To get back home, to get off the walker  OBJECTIVE:    TODAY'S TREATMENT: 11/09/2022 Activity Comments  Bed mobility work:  supine<>sit x 3 reps -had hemiwalker simulating bed rail, pt goes sit>L sidelying>supine; then supine>L sidelying to sit PT provides cues for hooklying poistion, lower trunk rotation to assist with momentum and cues to bring RUE across to help with ease of supine>sidelying to sit.  Pt requires min/mod assistance and extra time  Sit<>stand 3 x 5 reps, min-mod assist From elevated mat surface, cues for foot placement, forward lean, look ahead at visual target .  Cues for slowed descent into sitting  Standing balance: -feet apart EC 3 x 10" -feet apart with head turns x 5, then progressed to trunk rotation -feet together EO 3 x 30" Min guard assist, intermittent UE support  Forward/back walking in parallel bars, min assist Cues for increased LLE stance time         PATIENT EDUCATION: Education details: bed  mobility technique Person educated: Patient, Child(ren), and Caregiver   Education method: Explanation, Demonstration, Tactile cues, and Verbal cues Education comprehension: verbalized understanding, returned demonstration, and needs further education   HOME EXERCISE PROGRAM Last updated: 10/27/22 Access Code: [email or web address] URL: [email or web address] Date: 10/27/2022 Prepared by: [facility]  Exercises - Sit to Stand with Counter Support  - 1 x daily - 7 x weekly - 3 sets - 5 reps 1. scoot bottom to edge of seat 2. tuck feet underneath you 3. lean nose over toes - Heel Raises with Counter Support  - 1 x daily - 7 x weekly - 3 sets - 10 reps - Side to Side Weight Shift with Overhead Reach and Counter Support  - 1 x daily - 7 x weekly - 3 sets - 10 reps - Staggered Stance Forward Backward Weight Shift with Counter Support  - 1 x daily - 7 x weekly - 3 sets - 10 reps - Alternating Step Taps with Counter Support  - 1 x daily - 5 x weekly - 3 sets - 10 reps - Side Stepping with Counter Support  - 1 x daily - 5 x weekly - 1 sets - 3 reps   ------------------------------------------------------------------------------------------------- Objective measures below taken at time of eval:  DIAGNOSTIC FINDINGS: MRI 06/11/22-no changes; 03/21/22 MRI:  acute ischemia within the L caudate body; old R basal ganglia small vessel infarct  COGNITION: Overall cognitive status:  Dysarthria per notes; flat affect   SENSATION: Light touch: WFL   POSTURE: rounded shoulders and forward head  LOWER EXTREMITY ROM:   WFL for BLEs  Active  Right Eval Left Eval  Hip flexion    Hip extension    Hip abduction  Hip adduction    Hip internal rotation    Hip external rotation    Knee flexion    Knee extension    Ankle dorsiflexion    Ankle plantarflexion    Ankle inversion    Ankle eversion     (Blank rows = not tested)  LOWER EXTREMITY MMT:    MMT  Right Eval Left Eval  Hip flexion 3+ 3+  Hip extension    Hip abduction 3+ 3+  Hip adduction 3 3  Hip internal rotation    Hip external rotation    Knee flexion 4 3+  Knee extension 3+ 3+  Ankle dorsiflexion 3+ 3-  Ankle plantarflexion    Ankle inversion    Ankle eversion    (Blank rows = not tested)  BED MOBILITY:  Requires assist of family, daughter reports providing 50% assistance  TRANSFERS: Assistive device utilized:  UE support to stand at locked rollator   Sit to stand: SBA with UE support Stand to sit: CGA with UE support   GAIT: Gait pattern: step through pattern, decreased step length- Right, decreased step length- Left, and poor foot clearance- Right Distance walked: 50 ft Assistive device utilized: Walker - 4 wheeled Level of assistance: CGA Comments: 10 M walk:  20.34 sec (1.61 ft/sec)  FUNCTIONAL TESTS:  5 times sit to stand: 41.35 sec with UE support Timed up and go (TUG): 45.90 sec with 4-wheeled RW Berg Balance Scale: 24/56 10 M walk:  1.61 ft/sec  with RW  PATIENT SURVEYS:  FOTO intake score 51; predicted score at discharge 53      PATIENT EDUCATION: Education details: Eval results, POC Person educated: Patient and Child(ren) Education method: Explanation Education comprehension: verbalized understanding    GOALS: Goals reviewed with patient? Yes  SHORT TERM GOALS: Target date: 11/13/2022  Pt will be supervision with HEP for improved strength, balance, gait. Baseline: Goal status: IN PROGRESS  2.  Pt will improve 5x sit<>stand to less than or equal to 30 sec to demonstrate improved functional strength and transfer efficiency.  Baseline: 41.35 sec Goal status: IN PROGRESS  3.  Pt will improve Berg score to at least 30/56 to decrease fall risk. Baseline: 24/56 Goal status: IN PROGRESS  4.  Pt will improve TUG score to less than or equal to 35 sec for decreased fall risk. Baseline: 45.9 sec Goal status: INITIAL  LONG TERM GOALS:  Target date: 12/11/2022  Pt will be supervision with HEP for improved strength, balance, gait. Baseline:  Goal status: IN PROGRESS  2.  Pt will improve 5x sit<>stand to less than or equal to 20 sec to demonstrate improved functional strength and transfer efficiency.  Baseline:  Goal status: IN PROGRESS  3.  Pt will improve Berg score to at least 40/56 to decrease fall risk. Baseline:  Goal status: IN PROGRESS  4.  Pt will improve TUG score to less than or equal to 20 sec for decreased fall risk. Baseline:  Goal status: IN PROGRESS  5.  Pt will improve gait velocity to at least 2 ft/sec for improved gait efficiency and safety. Baseline: 1.61 ft/sec Goal status: IN PROGRESS  ASSESSMENT:  CLINICAL IMPRESSION: Skilled PT session today focused on bed mobility and transfer training.  Pt reports at home having to have someone pull her up, despite having a bed rail.  Simulated her bed situation with hemiwalker as bedrail, and worked on improved supine>sidelying>sit.  Pt has good form, and responds well to cues for hooklying position to  use trunk rotation for improved momentum to assist with ease of bed mobility.  She needs cues each repetition in session today, and daughter present for instruction; pt is able to recall this technique at end of session today.  Also worked on standing balance, with min-mod sway with feet apart/narrowed without UE support.  She will continue to benefit from skilled PT towards goals for improved functional mobility.  OBJECTIVE IMPAIRMENTS: Abnormal gait, decreased balance, decreased mobility, difficulty walking, decreased strength, and postural dysfunction.   ACTIVITY LIMITATIONS: lifting, bending, standing, squatting, transfers, bed mobility, toileting, hygiene/grooming, and locomotion level  PARTICIPATION LIMITATIONS: meal prep, cleaning, laundry, driving, shopping, and community activity  PERSONAL FACTORS: Time since onset of injury/illness/exacerbation and 3+  comorbidities: see above for PMH  are also affecting patient's functional outcome.   REHAB POTENTIAL: Good  CLINICAL DECISION MAKING: Evolving/moderate complexity  EVALUATION COMPLEXITY: Moderate  PLAN:  PT FREQUENCY: 2x/week  PT DURATION: 8 weeks plus 1x/wk (during eval week)-9 week POC  PLANNED INTERVENTIONS: Therapeutic exercises, Therapeutic activity, Neuromuscular re-education, Balance training, Gait training, Patient/Family education, and Self Care  PLAN FOR NEXT SESSION:Check STGs and discuss POC.  Review bed mobility-how did this go at home.  Additional work on bed mobility, transfers, standing strength and balance   Mady Haagensen, PT 11/09/22 5:21 PM Phone: [phone] Fax: [facility] [address] [place], [place] [address] Phone # [phone] Fax # [phone]

## 2022-11-09 NOTE — Therapy (Signed)
OUTPATIENT SPEECH LANGUAGE PATHOLOGY TREATMENT   Patient Name: Jessica [name] MRN: [id number] DOB:1941-06-05, 82 y.o., female Today's Date: 11/10/2022  PCP: [name], MD  REFERRING PROVIDER: Heywood Iles, MD  END OF SESSION:  End of Session - 11/10/22 0006     Visit Number 4    Number of Visits 25    Date for SLP Re-Evaluation 01/11/23    SLP Start Time 1405    SLP Stop Time  2297    SLP Time Calculation (min) 40 min    Activity Tolerance Patient tolerated treatment well                Past Medical History:  Diagnosis Date   Allergy    Anemia    Anxiety    Asthma    in past, no inhalers now   Blood transfusion without reported diagnosis    Breast cancer ([place]) 01/2017   left breast    Breast cancer of upper-inner quadrant of left female breast ([place]) 03/11/2017   Cancer ([place]) 01/2017   left breast   Cataract    bilateral removed   Chronic kidney disease    kidney stones   Depression 08/31/2013   Dyslipidemia, goal LDL below 160 08/31/2013   Essential hypertension - well controlled 08/31/2013   Family history of breast cancer    Family history of melanoma    Family history of prostate cancer    GERD (gastroesophageal reflux disease)    Goals of care, counseling/discussion 03/11/2017   History of radiation therapy 10/13/17-11/11/17   left breast 40.05 Gy in 15 fractions, left breast boost 10 Gy in 5 fractions   Hypothyroidism    Ischemic stroke [facility])    May 2023;   Obesity (BMI 30-39.9) 08/31/2013   Personal history of chemotherapy 2018   Personal history of radiation therapy 2019   Thyroid disease    Vasovagal near-syncope -rare 08/31/2013   Past Surgical History:  Procedure Laterality Date   ABDOMINAL HYSTERECTOMY     total   BREAST BIOPSY Left 02/24/2017    malignant   BREAST LUMPECTOMY Left 03/23/2017   broken fingers     CATARACT EXTRACTION Bilateral    COLONOSCOPY     COLONOSCOPY WITH PROPOFOL N/A 06/03/2022   Procedure:  COLONOSCOPY WITH PROPOFOL;  Surgeon: [name], MD;  Location: [facility];  Service: Gastroenterology;  Laterality: N/A;   ESOPHAGOGASTRODUODENOSCOPY (EGD) WITH PROPOFOL N/A 05/29/2022   Procedure: ESOPHAGOGASTRODUODENOSCOPY (EGD) WITH PROPOFOL;  Surgeon: Jerene Bears, MD;  Location: [facility];  Service: Gastroenterology;  Laterality: N/A;   EXCISIONAL HEMORRHOIDECTOMY     IR FLUORO GUIDE PORT INSERTION RIGHT  08/04/2017   IR REMOVAL TUN ACCESS W/ PORT W/O FL MOD SED  08/04/2017   IR US GUIDE VASC ACCESS RIGHT  08/04/2017   IRRIGATION AND DEBRIDEMENT ABSCESS Left 05/18/2017   Procedure: IRRIGATION AND DEBRIDEMENT LEFT AXILLARY ABSCESS;  Surgeon: Michael Boston, MD;  Location: [facility];  Service: General;  Laterality: Left;   kidney stones lithotripsy     POLYPECTOMY  06/03/2022   Procedure: POLYPECTOMY;  Surgeon: [name], MD;  Location: [facility];  Service: Gastroenterology;;   PORTACATH PLACEMENT Right 03/23/2017   Procedure: INSERTION PORT-A-CATH;  Surgeon: Erroll Luna, MD;  Location: [place];  Service: General;  Laterality: Right;   RADIOACTIVE SEED GUIDED PARTIAL MASTECTOMY WITH AXILLARY SENTINEL LYMPH NODE BIOPSY Left 03/23/2017   Procedure: LEFT BREAST RADIOACTIVE SEED GUIDED PARTIAL MASTECTOMY AND  SENTINEL LYMPH NODE  MAPPING;  Surgeon: Erroll Luna, MD;  Location: [place];  Service: General;  Laterality: Left;   ROTATOR CUFF REPAIR     left    TONSILLECTOMY     WISDOM TOOTH EXTRACTION     Patient Active Problem List   Diagnosis Date Noted   Altered mental status 06/11/2022   Acute encephalopathy 06/10/2022   UTI (urinary tract infection) 06/10/2022   Constipation    History of stroke in adulthood 06/05/2022   Heme positive stool    Iron deficiency anemia due to chronic blood loss    Left thalamic infarction ([place]) 03/23/2022   Acquired hammer toe of right foot 08/19/2021   Adverse reaction to ACE-I  (angiotensin-converting enzyme inhibitor) 08/19/2021   Allergic rhinitis 08/19/2021   Anxiety 08/19/2021   Asthma 08/19/2021   Cardiomegaly 08/19/2021   Dependence on other enabling machines and devices 08/19/2021   History of adenomatous polyp of colon 08/19/2021   History of pneumonia 08/19/2021   Internal hemorrhoid 08/19/2021   Iron deficiency anemia secondary to inadequate dietary iron intake 08/19/2021   Osteopenia 08/19/2021   Personal history of malignant neoplasm of breast 08/19/2021   Somnolence 08/19/2021   Vitamin D deficiency 08/19/2021   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   Insomnia 06/25/2019   Obstructive sleep apnea 06/13/2019   Genetic testing 06/29/2017   Family history of melanoma    Family history of prostate cancer    Family history of breast cancer    Angioedema 05/26/2017   Left axillary seroma status post incision and drainage 05/18/2017 05/18/2017   Cellulitis of left axilla 05/17/2017   GERD (gastroesophageal reflux disease) 05/16/2017   Acquired hypothyroidism 05/16/2017   Depression with anxiety 05/16/2017   Breast cancer of upper-inner quadrant of left female breast ([place]) 03/11/2017   Goals of care, counseling/discussion 03/11/2017   Obesity (BMI 30-39.9) 08/31/2013   Essential hypertension - well controlled 08/31/2013    Class: Diagnosis of   Depression 08/31/2013    Class: Diagnosis of   Vasovagal near-syncope -rare 08/31/2013    Class: History of   Dyslipidemia, goal LDL below 160 08/31/2013    ONSET DATE: May 2023   REFERRING DIAG: I63.50 (ICD-10-CM) - Cerebrovascular accident (CVA) due to occlusion of cerebral artery ([place])  THERAPY DIAG:  Dysarthria and anarthria  Aphasia  Cognitive communication deficit  Rationale for Evaluation and Treatment: Rehabilitation  SUBJECTIVE:   SUBJECTIVE STATEMENT: Pt very flat affect today. "I guess I'll keep her." (Pt, re: [name]) Pt accompanied by:  caregiver  - Kim  PERTINENT HISTORY:  Presented  03/20/2022 with right-sided weakness slurred speech and altered mental status with right facial droop of acute onset.  CT/MRI showed small focus of acute ischemia within the left caudate body.  No hemorrhage or mass effect.  Old right basal ganglia small vessel infarct and findings of chronic microvascular disease.  Patient did not receive tPA.  CT angiogram head and neck no emergent large vessel occlusion.  Echocardiogram ejection fraction of 60 to 65% no wall motion abnormalities.  Pt was [facility] to [facility] on 05/28/2022 with suspected acute lower gastrointestinal bleed complicated by acute blood loss anemia after presenting from home to [facility] ED complaining of low hemoglobin per outpatient labs.  In September 2023 Noeli was brought by her daughters after a fall at home. She was leaning over to pick up a piece of paper and lost her balance hitting her nose and L cheek on the ground. Unsure if she also hit  her head. Did not have LOC. Not on a blood thinner. No other reports of injuries.  She rec'd HHST, which, according to daughter focused mostly on swallowing.  PAIN:  Are you having pain? No  PATIENT GOALS: Improve memory  OBJECTIVE:   DIAGNOSTIC FINDINGS:  07/07/22 - IMPRESSION: 1. No acute intracranial abnormality. No skull fracture. 2. Stable atrophy, chronic small vessel ischemia, and remote bilateral basal gangliar lacunar infarcts.  PATIENT REPORTED OUTCOME MEASURES (PROM): Cognitive Function:   and Communication Participation Item Bank: to be provided in first 1-2 sessions   TODAY'S TREATMENT:                                                                                                                                         DATE:  12/07/98: SLP ascertained table tent for "medicine" still not being used. [name] stated they will begin to do this today. SLP cued pt about her memory notebook, which pt showed SLP correct tab to find PT exercises, OT exercises and ST handout - all  with extra time. SLP, throughout session, provided education for Kim for pt to set timers, keep a log of daily activities, cross off calendar days, and to request her medication after her meals. SLP educated pt and [name] about memory strategies.   11/04/22: Pt did not have opportunity to use table tent as [name] had family emergency and she could not communicate info to [place] to put into practice. SLP encouraged this to occur soon. [name] brought a 3 ring binder with notebook paper, a loose calendar with a pen, and all therapy papers in a pocket. SLP had pt pt write tabs ST PT and OT - pt wrote 2/3 legibly. She organized with mod A (put OT HEP with her PT HEP), but separated ST papers correctly. SLP provided a calendar of January 2024 and suggested that [name] write the therapies in the dates and times to which Kendell tells her. SLP highlighted that we want Danyla to do as much as she can safely do - due to entries on the loose calendar being ~90% illegible.  SLP strongly suggested [name] attend a therapy session for carryover of OT and PT exercises. SLP req'd to ask Amberley to repeat 25% of her utterances. SLP not convinced pt desires ST after today's session. SLP to cont to develop rapport with pt and highlight benefits of ST to pt's rehab journey. Memory strategies to be discussed next session as SLP targeted memory notebook/system today as daughter indicated it was brought to ST.  10/16/22: Will discuss memory compensations further next session. Discussed pt's medication and timing with pt today. Karsynn told SLP incorrect schedule apart from mealtimes and bedtime. [name] assisted. SLP suggested table tent for med reminder to tell Judeen Hammans it was time for medication administration. Pt shared she does not like taste of Potassium and feeds it to the dog. SLP assisted problem solving  with pt and daughter about how to achieve goal of taking Potassium and also having alternative to taste (brand with heavier coating). SLP  suggested calendar for pt to view appointments - dtr to buy calendar and put appointments on it.  10/13/22: SLP discussed findings so far in evaluation. SLP will need to perform further assessment once rapport is developed with pt as she did not freely offer much information today SLP suspects due to reason that she does not desire tx for dysarthria. During evaluation pt stated she has trouble with memory and so SLP assessed memory. SLP hopes to eventually, as pt warms to him and ST in general, target pt's soft conversational speech.  PATIENT EDUCATION: Education details: as above in "today's treatment" Person educated: Patient and Child(ren) Education method: Explanation Education comprehension: verbalized understanding and needs further education   GOALS: Goals reviewed with patient? Yes  SHORT TERM GOALS: Target date: 11-20-22  Pt will bring memory compensation device/system to 60% of ST sessions Baseline: (2/2) Goal status: Ongoing  2.  Pt will indicate knowledge she can use memory system for functional tasks (I.e., will open/acess system when asked her therapy schedule) x 3 sessions Baseline:  Goal status: Ongoing  3.  Undergo more in depth cognitive communication testing in first 4 sessions Baseline:  Goal status: Ongoing  4.  Krista will indicate she has told daughter to administer her meds for 7 consecutive days Baseline:  Goal status: Ongoing   LONG TERM GOALS: Target date: 01/10/23  Skylee will track appointments and other social events using compensations in 5 sessions Baseline:  Goal status: Ongoing  2.  Malay will indicate she has administered her meds correctly with SBA for 7 consecutive days Baseline:  Goal status: Ongoing  3.  [place] will indicate she feels more confident talking than initial sessions by improved PRM score/s Baseline:  Goal status: Ongoing   ASSESSMENT:  CLINICAL IMPRESSION: Patient is a 82 y.o. female who was seen today for  treatment of memory in language. Discussion on memory compensation system completed today with pt's memory book. Voice volume was slightly higher today with Kim in the room.    OBJECTIVE IMPAIRMENTS: include attention, memory, aphasia, and dysarthria. These impairments are limiting patient from managing medications, managing appointments, managing finances, household responsibilities, ADLs/IADLs, and effectively communicating at home and in community. Factors affecting potential to achieve goals and functional outcome are ability to learn/carryover information and cooperation/participation level.. Patient will benefit from skilled SLP services to address above impairments and improve overall function.  REHAB POTENTIAL: Fair given pt's motivation/response to SLP about desire for ST today during eval  PLAN:  SLP FREQUENCY: 2x/week  SLP DURATION: 12 weeks  PLANNED INTERVENTIONS: Language facilitation, Environmental controls, Cueing hierachy, Cognitive reorganization, Internal/external aids, Oral motor exercises, Functional tasks, Multimodal communication approach, SLP instruction and feedback, Compensatory strategies, and Patient/family education    [facility], [place] 11/10/2022, 12:07 AM

## 2022-11-09 NOTE — Therapy (Signed)
OUTPATIENT OCCUPATIONAL THERAPY  Treatment Note  Patient Name: Jessica [name] MRN: [id number] DOB:1940/12/10, 82 y.o., female Today's Date: 11/09/2022  PCP: [name], MD REFERRING PROVIDER: Izora Ribas, MD  END OF SESSION:  OT End of Session - 11/09/22 1452     Visit Number 5    Number of Visits 17    Date for OT Re-Evaluation 12/11/22    Authorization Type [employer]    OT Start Time 1448    OT Stop Time 1530    OT Time Calculation (min) 42 min                 Past Medical History:  Diagnosis Date   Allergy    Anemia    Anxiety    Asthma    in past, no inhalers now   Blood transfusion without reported diagnosis    Breast cancer ([place]) 01/2017   left breast    Breast cancer of upper-inner quadrant of left female breast ([place]) 03/11/2017   Cancer ([place]) 01/2017   left breast   Cataract    bilateral removed   Chronic kidney disease    kidney stones   Depression 08/31/2013   Dyslipidemia, goal LDL below 160 08/31/2013   Essential hypertension - well controlled 08/31/2013   Family history of breast cancer    Family history of melanoma    Family history of prostate cancer    GERD (gastroesophageal reflux disease)    Goals of care, counseling/discussion 03/11/2017   History of radiation therapy 10/13/17-11/11/17   left breast 40.05 Gy in 15 fractions, left breast boost 10 Gy in 5 fractions   Hypothyroidism    Ischemic stroke [facility])    May 2023;   Obesity (BMI 30-39.9) 08/31/2013   Personal history of chemotherapy 2018   Personal history of radiation therapy 2019   Thyroid disease    Vasovagal near-syncope -rare 08/31/2013   Past Surgical History:  Procedure Laterality Date   ABDOMINAL HYSTERECTOMY     total   BREAST BIOPSY Left 02/24/2017    malignant   BREAST LUMPECTOMY Left 03/23/2017   broken fingers     CATARACT EXTRACTION Bilateral    COLONOSCOPY     COLONOSCOPY WITH PROPOFOL N/A 06/03/2022   Procedure: COLONOSCOPY WITH PROPOFOL;   Surgeon: [name], MD;  Location: [facility];  Service: Gastroenterology;  Laterality: N/A;   ESOPHAGOGASTRODUODENOSCOPY (EGD) WITH PROPOFOL N/A 05/29/2022   Procedure: ESOPHAGOGASTRODUODENOSCOPY (EGD) WITH PROPOFOL;  Surgeon: Jerene Bears, MD;  Location: [facility];  Service: Gastroenterology;  Laterality: N/A;   EXCISIONAL HEMORRHOIDECTOMY     IR FLUORO GUIDE PORT INSERTION RIGHT  08/04/2017   IR REMOVAL TUN ACCESS W/ PORT W/O FL MOD SED  08/04/2017   IR US GUIDE VASC ACCESS RIGHT  08/04/2017   IRRIGATION AND DEBRIDEMENT ABSCESS Left 05/18/2017   Procedure: IRRIGATION AND DEBRIDEMENT LEFT AXILLARY ABSCESS;  Surgeon: Michael Boston, MD;  Location: [facility];  Service: General;  Laterality: Left;   kidney stones lithotripsy     POLYPECTOMY  06/03/2022   Procedure: POLYPECTOMY;  Surgeon: [name], MD;  Location: [facility];  Service: Gastroenterology;;   PORTACATH PLACEMENT Right 03/23/2017   Procedure: INSERTION PORT-A-CATH;  Surgeon: Erroll Luna, MD;  Location: [facility];  Service: General;  Laterality: Right;   RADIOACTIVE SEED GUIDED PARTIAL MASTECTOMY WITH AXILLARY SENTINEL LYMPH NODE BIOPSY Left 03/23/2017   Procedure: LEFT BREAST RADIOACTIVE SEED GUIDED PARTIAL MASTECTOMY AND  SENTINEL LYMPH NODE [place];  Surgeon: Erroll Luna, MD;  Location: [facility];  Service: General;  Laterality: Left;   ROTATOR CUFF REPAIR     left    TONSILLECTOMY     WISDOM TOOTH EXTRACTION     Patient Active Problem List   Diagnosis Date Noted   Altered mental status 06/11/2022   Acute encephalopathy 06/10/2022   UTI (urinary tract infection) 06/10/2022   Constipation    History of stroke in adulthood 06/05/2022   Heme positive stool    Iron deficiency anemia due to chronic blood loss    Left thalamic infarction ([place]) 03/23/2022   Acquired hammer toe of right foot 08/19/2021   Adverse reaction to ACE-I (angiotensin-converting enzyme inhibitor)  08/19/2021   Allergic rhinitis 08/19/2021   Anxiety 08/19/2021   Asthma 08/19/2021   Cardiomegaly 08/19/2021   Dependence on other enabling machines and devices 08/19/2021   History of adenomatous polyp of colon 08/19/2021   History of pneumonia 08/19/2021   Internal hemorrhoid 08/19/2021   Iron deficiency anemia secondary to inadequate dietary iron intake 08/19/2021   Osteopenia 08/19/2021   Personal history of malignant neoplasm of breast 08/19/2021   Somnolence 08/19/2021   Vitamin D deficiency 08/19/2021   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   Insomnia 06/25/2019   Obstructive sleep apnea 06/13/2019   Genetic testing 06/29/2017   Family history of melanoma    Family history of prostate cancer    Family history of breast cancer    Angioedema 05/26/2017   Left axillary seroma status post incision and drainage 05/18/2017 05/18/2017   Cellulitis of left axilla 05/17/2017   GERD (gastroesophageal reflux disease) 05/16/2017   Acquired hypothyroidism 05/16/2017   Depression with anxiety 05/16/2017   Breast cancer of upper-inner quadrant of left female breast ([place]) 03/11/2017   Goals of care, counseling/discussion 03/11/2017   Obesity (BMI 30-39.9) 08/31/2013   Essential hypertension - well controlled 08/31/2013    Class: Diagnosis of   Depression 08/31/2013    Class: Diagnosis of   Vasovagal near-syncope -rare 08/31/2013    Class: History of   Dyslipidemia, goal LDL below 160 08/31/2013    ONSET DATE: 03/20/22  REFERRING DIAG: I63.50 (ICD-10-CM) - Cerebrovascular accident (CVA) due to occlusion of cerebral artery  THERAPY DIAG:  Unsteadiness on feet  Muscle weakness (generalized)  Other abnormalities of gait and mobility  Rationale for Evaluation and Treatment: Rehabilitation  SUBJECTIVE:   SUBJECTIVE STATEMENT: [name] daughter reports that pt has been having more bladder accidents. Pt accompanied by: self and family member (daughter, [name])  PERTINENT HISTORY: Past  medical history of triple negative stage IIa ductal carcinoma of the left breast cancer s/p partial mastectomy 2018, HLD, HTN, CVA May 2023 with R residual deficits and expressive aphaisa.  PRECAUTIONS: Shoulder  WEIGHT BEARING RESTRICTIONS: No  PAIN:  Are you having pain? Yes: NPRS scale: 3/10 Pain location: R shoulder Pain description: sore Aggravating factors: overhead movement Relieving factors: rest  FALLS: Has patient fallen in last 6 months? Yes. Number of falls 1 fall (and another near fall)  LIVING ENVIRONMENT: Lives with: lives with their family (living with daughter Judeen Hammans since [place]) Lives in: House/apartment Stairs: Yes: External: 1 high brick steps; none Has following equipment at home: Lobbyist, Environmental consultant - 2 wheeled, Environmental consultant - 4 wheeled, shower chair, and Grab bars  PLOF: Independent  PATIENT GOALS: to be like I was before  OBJECTIVE:   HAND DOMINANCE: Right  ADLs: Overall ADLs: aide assists with majority of bathing/dressing Transfers/ambulation related  to ADLs: Supervision with RW in home, Rollator in community Eating: difficulty with cutting foods, spilling food off utensil due to inconsistent tremor like movements Grooming: Supervision UB Dressing: Max assist LB Dressing: Max assist Toileting: assist to pull up pants after toileting Bathing: Mod assist Tub Shower transfers: Min - HHA with transfers in/out of tub/shower Equipment: Shower seat with back and Grab bars  IADLs: Meal Prep: will occasionally pour cereal or fix oatmeal, aide does majority of other meal tasks Medication management: [facility] prepares packages of meds as prescribed and caregiver hands them to pt to take  UPPER EXTREMITY ROM:    Active ROM Right eval Left eval  Shoulder flexion 84 (h/o shoulder injury and surgery) 95  Shoulder abduction    Shoulder adduction    Shoulder extension    Shoulder internal rotation    Shoulder external rotation    Elbow flexion 80*    Elbow extension -10*   Wrist flexion    Wrist extension    Wrist ulnar deviation    Wrist radial deviation    Wrist pronation    Wrist supination    (Blank rows = not tested)  UPPER EXTREMITY MMT:   not tested due to pain with AROM and attempts at PROM   COORDINATION: 9 Hole Peg test: Right: 53.41 sec; Left: 58.41 sec Box and Blocks:  Right 21 blocks, Left 23 blocks   OBSERVATIONS: Pt with flat affect and somewhat withdrawn.  [name] daughter reports that pt was able to complete ADLs at higher level after leaving [facility] but has allowed aide to assist with ADLs and provide hand held assist with transitional movements.   TODAY'S TREATMENT:                                                                        11/09/22 Self-care: discussed circumstances around bladder accidents and whether urgency or waiting too long to begin process of bathroom transfers.  Engaged in discussion about RW placement when seated to ensure safety when getting up to go to the bathroom as family member or caregiver frequently having to bring RW to pt prior to ambulating in home.  Engaged in simulated home setup to increase carryover of RW placement prior to and during mobility. Coordination: incorporating functional reach to facilitate increased anterior weight shifting as daughter and caregiver reporting that pt frequently spilling items when feeding herself.  Noted some shakiness in BUE, R > L with reaching and manipulation of large pegs.  Discussed sitting posture and placement of body within proximity of table top and option of sitting at a different table/chair for increased posture. UB dressing: OT providing demonstration of hand placement to increase ease and independence with UB dressing as pt demonstrating decreased sequencing and difficulty due to limited shoulder ROM.  Pt to bring additional pull over shirt to next visit to practice UB dressing.  Reiterated UE exercises with focus on keeping elbows close to  body during bag exercises to carry over to UB dressing.    11/04/22 Self-care: engaged in length conversation about circumstances surrounding fall in bathroom.  OT identifying fall risks during particular situation. OT educated on fall risk and suggestions to decrease fall risk, such as ensuring appropriate footwear, no  pants under feet, removal of rugs, proper hand placement with sit > stand, question cues from caregivers prior to physical assistance, etc.  Pt reports that she feels like her daughters are always telling her what she is doing wrong, encouraged positive reinforcement as well as pt to assess situation prior to standing.      Transitional movements: engaged in sit > stand from various height surfaces and with various hand placements to increase awareness of positioning and allow for sequencing.  On one instance, pt initially placed B hands on Rollator but corrected hand placement prior to sit > stand.  Pt demonstrating good technique and awareness with transitional movements this session.    10/19/22 LB dressing: massed practice with threading BLE and pulling pants up/down over hips with use of gait belt to simulate pants.  OT providing min cues and allowing increased time for pt to problem solve challenges.  Pt able to recall technique from previous session with min cues to increase reach in back.  Pt demonstrating improved ease with sit > stand throughout session. ADL: pt's caregiver present during session and reporting that she did not help her with clothing management during toileting tasks this session, but continues to provide supervision.  Pt reports that she feels that one of her daughters helped her too much in the bathroom.  Engaged in discussion about providing verbal cues to aid pt initially before physically assisting with clothing management.  Also discussed decreasing physical assistance while still providing supervision to allow pt to feel increased independence. UB dressing:  engaged in bag exercise with use of BUE to advance bag over head and back to simulate donning shirt.  Pt demonstrating decreased shoulder flexion Bilaterally, L > R.  OT providing demonstration of technique and hand placement to facilitate increased ease and ROM with UB dressing to include forward reach to allow for increased shoulder flexion.   PATIENT EDUCATION: Education details: fall risk, and safety awareness to reduce falls Person educated: Patient and Child(ren) Education method: Customer service manager Education comprehension: verbalized understanding and needs further education  HOME EXERCISE PROGRAM: TBD   GOALS: Goals reviewed with patient? Yes  SHORT TERM GOALS: Target date: 11/13/22  Pt  and caregiver will verbalize understanding of task modifications and/or potential AE needs to increase ease, safety, and independence w/ ADLs. Baseline: Goal status: IN PROGRESS  2.  Pt will complete toilet transfer with supervision and no verbal cues 5 out of 6 attempts to demonstrate improved mobility and progress to decreasing level of supervision. Baseline: currently CGA to min assist Goal status: IN PROGRESS  3.  Pt will be able to manage clothing up/down for toileting with distant supervision to progress to increased independence with toileting tasks. Baseline: currently min-mod assist Goal status: MET - 11/09/22  4.  Pt will be able to complete UB dressing with supervision assist (with exception of bra). Baseline: currently max assist Goal status: IN PROGRESS  LONG TERM GOALS: Target date: 12/11/22  Pt will be able to complete LB dressing with supervision, to include donning socks and shoes. Baseline: currently max assist Goal status: IN PROGRESS  2.  Pt will be able to don bra at Mod I level. Baseline: unable to complete Goal status: IN PROGRESS  3.  Pt will be able to complete all aspects of toileting (transfer, clothing management, and hygiene) at Mod I level with  LRAD. Baseline: Min-mod assist Goal status: IN PROGRESS  4.  Pt will be able to maintain dynamic standing balance  for 10 mins w/o LOB using DME and/or countertop support prn to increase ease, safety, and independence with engagement in meal prep tasks. Baseline:  Goal status: IN PROGRESS  5.  Patient will demonstrate sufficient range of motion and activity tolerance to use BUE to shampoo her hair in the shower  Baseline: RUE 84* and LUE 95* shoulder ROM Goal status: IN PROGRESS   ASSESSMENT:  CLINICAL IMPRESSION: Pt continues to report feeling that her family and caregiver provide inconsistent styles of assistance.  Pt with little carryover from previous sessions.  Pt may benefit from increased written instructions as pt with different family members and caregivers providing assistance at home.    PERFORMANCE DEFICITS: in functional skills including ADLs, IADLs, coordination, ROM, strength, pain, flexibility, Gross motor control, balance, body mechanics, endurance, decreased knowledge of precautions, decreased knowledge of use of DME, and UE functional use, cognitive skills including emotional, energy/drive, problem solving, safety awareness, and sequencing, and psychosocial skills including coping strategies, environmental adaptation, habits, and routines and behaviors.   IMPAIRMENTS: are limiting patient from ADLs and IADLs.   CO-MORBIDITIES: may have co-morbidities  that affects occupational performance. Patient will benefit from skilled OT to address above impairments and improve overall function.  MODIFICATION OR ASSISTANCE TO COMPLETE EVALUATION: Min-Moderate modification of tasks or assist with assess necessary to complete an evaluation.  OT OCCUPATIONAL PROFILE AND HISTORY: Problem focused assessment: Including review of records relating to presenting problem.  CLINICAL DECISION MAKING: Moderate - several treatment options, min-mod task modification necessary  REHAB POTENTIAL:  Good  EVALUATION COMPLEXITY: Moderate    PLAN:  OT FREQUENCY: 2x/week  OT DURATION: 8 weeks  PLANNED INTERVENTIONS: self care/ADL training, therapeutic exercise, therapeutic activity, neuromuscular re-education, manual therapy, passive range of motion, balance training, functional mobility training, ultrasound, compression bandaging, moist heat, cryotherapy, cognitive remediation/compensation, psychosocial skills training, energy conservation, coping strategies training, and DME and/or AE instructions  RECOMMENDED OTHER SERVICES: NA  CONSULTED AND AGREED WITH PLAN OF CARE: Patient and family member/caregiver  PLAN FOR NEXT SESSION: Reiterate sit > stand and pulling pants up/down, short distance ambulatory transfers as needed to increase independence with toileting.  UB ROM exercises for increased ease, safety, independence with UB dressing - simulated UB dressing at next session.   Simonne Come, OTR/L 11/09/2022, 2:54 PM

## 2022-11-10 NOTE — Therapy (Signed)
OUTPATIENT PHYSICAL THERAPY NEURO TREATMENT   Patient Name: Jessica [name] MRN: [id number] DOB:1940/12/15, 82 y.o., female Today's Date: 11/10/2022   PCP: [name], MD REFERRING PROVIDER: Izora Ribas, MD  END OF SESSION:       Past Medical History:  Diagnosis Date   Allergy    Anemia    Anxiety    Asthma    in past, no inhalers now   Blood transfusion without reported diagnosis    Breast cancer ([place]) 01/2017   left breast    Breast cancer of upper-inner quadrant of left female breast ([place]) 03/11/2017   Cancer ([place]) 01/2017   left breast   Cataract    bilateral removed   Chronic kidney disease    kidney stones   Depression 08/31/2013   Dyslipidemia, goal LDL below 160 08/31/2013   Essential hypertension - well controlled 08/31/2013   Family history of breast cancer    Family history of melanoma    Family history of prostate cancer    GERD (gastroesophageal reflux disease)    Goals of care, counseling/discussion 03/11/2017   History of radiation therapy 10/13/17-11/11/17   left breast 40.05 Gy in 15 fractions, left breast boost 10 Gy in 5 fractions   Hypothyroidism    Ischemic stroke [facility])    May 2023;   Obesity (BMI 30-39.9) 08/31/2013   Personal history of chemotherapy 2018   Personal history of radiation therapy 2019   Thyroid disease    Vasovagal near-syncope -rare 08/31/2013   Past Surgical History:  Procedure Laterality Date   ABDOMINAL HYSTERECTOMY     total   BREAST BIOPSY Left 02/24/2017    malignant   BREAST LUMPECTOMY Left 03/23/2017   broken fingers     CATARACT EXTRACTION Bilateral    COLONOSCOPY     COLONOSCOPY WITH PROPOFOL N/A 06/03/2022   Procedure: COLONOSCOPY WITH PROPOFOL;  Surgeon: [name], MD;  Location: [facility];  Service: Gastroenterology;  Laterality: N/A;   ESOPHAGOGASTRODUODENOSCOPY (EGD) WITH PROPOFOL N/A 05/29/2022   Procedure: ESOPHAGOGASTRODUODENOSCOPY (EGD) WITH PROPOFOL;  Surgeon: Jerene Bears, MD;  Location: [facility];  Service: Gastroenterology;  Laterality: N/A;   EXCISIONAL HEMORRHOIDECTOMY     IR FLUORO GUIDE PORT INSERTION RIGHT  08/04/2017   IR REMOVAL TUN ACCESS W/ PORT W/O FL MOD SED  08/04/2017   IR US GUIDE VASC ACCESS RIGHT  08/04/2017   IRRIGATION AND DEBRIDEMENT ABSCESS Left 05/18/2017   Procedure: IRRIGATION AND DEBRIDEMENT LEFT AXILLARY ABSCESS;  Surgeon: Michael Boston, MD;  Location: [facility];  Service: General;  Laterality: Left;   kidney stones lithotripsy     POLYPECTOMY  06/03/2022   Procedure: POLYPECTOMY;  Surgeon: [name], MD;  Location: [facility];  Service: Gastroenterology;;   PORTACATH PLACEMENT Right 03/23/2017   Procedure: INSERTION PORT-A-CATH;  Surgeon: Erroll Luna, MD;  Location: [place];  Service: General;  Laterality: Right;   RADIOACTIVE SEED GUIDED PARTIAL MASTECTOMY WITH AXILLARY SENTINEL LYMPH NODE BIOPSY Left 03/23/2017   Procedure: LEFT BREAST RADIOACTIVE SEED GUIDED PARTIAL MASTECTOMY AND  SENTINEL LYMPH NODE MAPPING;  Surgeon: Erroll Luna, MD;  Location: [place];  Service: General;  Laterality: Left;   ROTATOR CUFF REPAIR     left    TONSILLECTOMY     WISDOM TOOTH EXTRACTION     Patient Active Problem List   Diagnosis Date Noted   Altered mental status 06/11/2022   Acute encephalopathy 06/10/2022   UTI (urinary tract infection) 06/10/2022  Constipation    History of stroke in adulthood 06/05/2022   Heme positive stool    Iron deficiency anemia due to chronic blood loss    Left thalamic infarction ([place]) 03/23/2022   Acquired hammer toe of right foot 08/19/2021   Adverse reaction to ACE-I (angiotensin-converting enzyme inhibitor) 08/19/2021   Allergic rhinitis 08/19/2021   Anxiety 08/19/2021   Asthma 08/19/2021   Cardiomegaly 08/19/2021   Dependence on other enabling machines and devices 08/19/2021   History of adenomatous polyp of colon 08/19/2021   History of pneumonia  08/19/2021   Internal hemorrhoid 08/19/2021   Iron deficiency anemia secondary to inadequate dietary iron intake 08/19/2021   Osteopenia 08/19/2021   Personal history of malignant neoplasm of breast 08/19/2021   Somnolence 08/19/2021   Vitamin D deficiency 08/19/2021   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   Insomnia 06/25/2019   Obstructive sleep apnea 06/13/2019   Genetic testing 06/29/2017   Family history of melanoma    Family history of prostate cancer    Family history of breast cancer    Angioedema 05/26/2017   Left axillary seroma status post incision and drainage 05/18/2017 05/18/2017   Cellulitis of left axilla 05/17/2017   GERD (gastroesophageal reflux disease) 05/16/2017   Acquired hypothyroidism 05/16/2017   Depression with anxiety 05/16/2017   Breast cancer of upper-inner quadrant of left female breast ([place]) 03/11/2017   Goals of care, counseling/discussion 03/11/2017   Obesity (BMI 30-39.9) 08/31/2013   Essential hypertension - well controlled 08/31/2013    Class: Diagnosis of   Depression 08/31/2013    Class: Diagnosis of   Vasovagal near-syncope -rare 08/31/2013    Class: History of   Dyslipidemia, goal LDL below 160 08/31/2013    ONSET DATE: 10/05/2022  REFERRING DIAG: I63.50 (ICD-10-CM) - Cerebrovascular accident (CVA) due to occlusion of cerebral artery ([place])   THERAPY DIAG:  No diagnosis found.  Rationale for Evaluation and Treatment: Rehabilitation  SUBJECTIVE:                                                                                                                                                                                  SUBJECTIVE STATEMENT: Nothing new.  "Lots of things to remember"  Pt accompanied by: family member, 3 daughters  PERTINENT HISTORY: HTN, HLD, hypothyroidism, asthma, depression, anxiety, L breast cancer post-mastectomy 2018, obesity; 03/20/22 with CVA R sided weakness  PAIN:  Are you having pain? Yes: NPRS scale:  0/10 Pain location: shoulder Pain description: aching Aggravating factors: sleeping on it wrong Relieving factors: rubbing it *OT aware PRECAUTIONS: Fall  WEIGHT BEARING RESTRICTIONS: No  FALLS: Has patient fallen in last 6 months? Yes. Number of falls 1  LIVING  ENVIRONMENT: Lives with: lives with their family and lives with their daughter Lives in: House/apartment Stairs: No Has following equipment at home: Gilford Rile - 2 wheeled, Environmental consultant - 4 wheeled, and shower chair  PLOF: Independent  PATIENT GOALS: To get back home, to get off the walker  OBJECTIVE:     TODAY'S TREATMENT: 11/11/22 Activity Comments  5xSTS   TUG   [place] Last updated: 10/27/22 Access Code: [email or web address] URL: [email or web address] Date: 10/27/2022 Prepared by: [facility]  Exercises - Sit to Stand with Counter Support  - 1 x daily - 7 x weekly - 3 sets - 5 reps 1. scoot bottom to edge of seat 2. tuck feet underneath you 3. lean nose over toes - Heel Raises with Counter Support  - 1 x daily - 7 x weekly - 3 sets - 10 reps - Side to Side Weight Shift with Overhead Reach and Counter Support  - 1 x daily - 7 x weekly - 3 sets - 10 reps - Staggered Stance Forward Backward Weight Shift with Counter Support  - 1 x daily - 7 x weekly - 3 sets - 10 reps - Alternating Step Taps with Counter Support  - 1 x daily - 5 x weekly - 3 sets - 10 reps - Side Stepping with Counter Support  - 1 x daily - 5 x weekly - 1 sets - 3 reps   ------------------------------------------------------------------------------------------------- Objective measures below taken at time of eval:  DIAGNOSTIC FINDINGS: MRI 06/11/22-no changes; 03/21/22 MRI:  acute ischemia within the L caudate body; old R basal ganglia small vessel infarct  COGNITION: Overall cognitive status:  Dysarthria per notes; flat affect   SENSATION: Light touch: WFL   POSTURE:  rounded shoulders and forward head  LOWER EXTREMITY ROM:   WFL for BLEs  Active  Right Eval Left Eval  Hip flexion    Hip extension    Hip abduction    Hip adduction    Hip internal rotation    Hip external rotation    Knee flexion    Knee extension    Ankle dorsiflexion    Ankle plantarflexion    Ankle inversion    Ankle eversion     (Blank rows = not tested)  LOWER EXTREMITY MMT:    MMT Right Eval Left Eval  Hip flexion 3+ 3+  Hip extension    Hip abduction 3+ 3+  Hip adduction 3 3  Hip internal rotation    Hip external rotation    Knee flexion 4 3+  Knee extension 3+ 3+  Ankle dorsiflexion 3+ 3-  Ankle plantarflexion    Ankle inversion    Ankle eversion    (Blank rows = not tested)  BED MOBILITY:  Requires assist of family, daughter reports providing 50% assistance  TRANSFERS: Assistive device utilized:  UE support to stand at locked rollator   Sit to stand: SBA with UE support Stand to sit: CGA with UE support   GAIT: Gait pattern: step through pattern, decreased step length- Right, decreased step length- Left, and poor foot clearance- Right Distance walked: 50 ft Assistive device utilized: Walker - 4 wheeled Level of assistance: CGA Comments: 10 M walk:  20.34 sec (1.61 ft/sec)  FUNCTIONAL TESTS:  5 times sit to stand: 41.35 sec with UE support Timed up and go (TUG): 45.90 sec with 4-wheeled RW Berg Balance Scale:  90/56 10 M walk:  1.61 ft/sec  with RW  PATIENT SURVEYS:  FOTO intake score 51; predicted score at discharge 53      PATIENT EDUCATION: Education details: Eval results, POC Person educated: Patient and Child(ren) Education method: Explanation Education comprehension: verbalized understanding    GOALS: Goals reviewed with patient? Yes  SHORT TERM GOALS: Target date: 11/13/2022  Pt will be supervision with HEP for improved strength, balance, gait. Baseline: Goal status: IN PROGRESS  2.  Pt will improve 5x sit<>stand to  less than or equal to 30 sec to demonstrate improved functional strength and transfer efficiency.  Baseline: 41.35 sec Goal status: IN PROGRESS  3.  Pt will improve Berg score to at least 30/56 to decrease fall risk. Baseline: 24/56 Goal status: IN PROGRESS  4.  Pt will improve TUG score to less than or equal to 35 sec for decreased fall risk. Baseline: 45.9 sec Goal status: INITIAL  LONG TERM GOALS: Target date: 12/11/2022  Pt will be supervision with HEP for improved strength, balance, gait. Baseline:  Goal status: IN PROGRESS  2.  Pt will improve 5x sit<>stand to less than or equal to 20 sec to demonstrate improved functional strength and transfer efficiency.  Baseline:  Goal status: IN PROGRESS  3.  Pt will improve Berg score to at least 40/56 to decrease fall risk. Baseline:  Goal status: IN PROGRESS  4.  Pt will improve TUG score to less than or equal to 20 sec for decreased fall risk. Baseline:  Goal status: IN PROGRESS  5.  Pt will improve gait velocity to at least 2 ft/sec for improved gait efficiency and safety. Baseline: 1.61 ft/sec Goal status: IN PROGRESS  ASSESSMENT:  CLINICAL IMPRESSION: Skilled PT session today focused on bed mobility and transfer training.  Pt reports at home having to have someone pull her up, despite having a bed rail.  Simulated her bed situation with hemiwalker as bedrail, and worked on improved supine>sidelying>sit.  Pt has good form, and responds well to cues for hooklying position to use trunk rotation for improved momentum to assist with ease of bed mobility.  She needs cues each repetition in session today, and daughter present for instruction; pt is able to recall this technique at end of session today.  Also worked on standing balance, with min-mod sway with feet apart/narrowed without UE support.  She will continue to benefit from skilled PT towards goals for improved functional mobility.  OBJECTIVE IMPAIRMENTS: Abnormal gait,  decreased balance, decreased mobility, difficulty walking, decreased strength, and postural dysfunction.   ACTIVITY LIMITATIONS: lifting, bending, standing, squatting, transfers, bed mobility, toileting, hygiene/grooming, and locomotion level  PARTICIPATION LIMITATIONS: meal prep, cleaning, laundry, driving, shopping, and community activity  PERSONAL FACTORS: Time since onset of injury/illness/exacerbation and 3+ comorbidities: see above for PMH  are also affecting patient's functional outcome.   REHAB POTENTIAL: Good  CLINICAL DECISION MAKING: Evolving/moderate complexity  EVALUATION COMPLEXITY: Moderate  PLAN:  PT FREQUENCY: 2x/week  PT DURATION: 8 weeks plus 1x/wk (during eval week)-9 week POC  PLANNED INTERVENTIONS: Therapeutic exercises, Therapeutic activity, Neuromuscular re-education, Balance training, Gait training, Patient/Family education, and Self Care  PLAN FOR NEXT SESSION:Check STGs and discuss POC.  Review bed mobility-how did this go at home.  Additional work on bed mobility, transfers, standing strength and balance

## 2022-11-11 ENCOUNTER — Ambulatory Visit: Payer: Medicare Other | Admitting: Occupational Therapy

## 2022-11-11 ENCOUNTER — Ambulatory Visit: Payer: Medicare Other | Admitting: Physical Therapy

## 2022-11-11 ENCOUNTER — Ambulatory Visit: Payer: Medicare Other

## 2022-11-11 ENCOUNTER — Encounter: Payer: Self-pay | Admitting: Physical Therapy

## 2022-11-11 DIAGNOSIS — R2689 Other abnormalities of gait and mobility: Secondary | ICD-10-CM

## 2022-11-11 DIAGNOSIS — R471 Dysarthria and anarthria: Secondary | ICD-10-CM

## 2022-11-11 DIAGNOSIS — M6281 Muscle weakness (generalized): Secondary | ICD-10-CM | POA: Diagnosis not present

## 2022-11-11 DIAGNOSIS — I69354 Hemiplegia and hemiparesis following cerebral infarction affecting left non-dominant side: Secondary | ICD-10-CM

## 2022-11-11 DIAGNOSIS — R293 Abnormal posture: Secondary | ICD-10-CM | POA: Diagnosis not present

## 2022-11-11 DIAGNOSIS — R2681 Unsteadiness on feet: Secondary | ICD-10-CM

## 2022-11-11 DIAGNOSIS — R4701 Aphasia: Secondary | ICD-10-CM

## 2022-11-11 DIAGNOSIS — R41841 Cognitive communication deficit: Secondary | ICD-10-CM

## 2022-11-11 NOTE — Therapy (Signed)
OUTPATIENT OCCUPATIONAL THERAPY  Treatment Note  Patient Name: Jessica [name] MRN: [id number] DOB:May 08, 1941, 82 y.o., female Today's Date: 11/11/2022  PCP: [name], MD REFERRING PROVIDER: Izora Ribas, MD  END OF SESSION:  OT End of Session - 11/11/22 1542     Visit Number 6    Number of Visits 17    Date for OT Re-Evaluation 12/11/22    Authorization Type [employer]    OT Start Time 1021    OT Stop Time 1173    OT Time Calculation (min) 40 min                  Past Medical History:  Diagnosis Date   Allergy    Anemia    Anxiety    Asthma    in past, no inhalers now   Blood transfusion without reported diagnosis    Breast cancer ([place]) 01/2017   left breast    Breast cancer of upper-inner quadrant of left female breast ([place]) 03/11/2017   Cancer ([place]) 01/2017   left breast   Cataract    bilateral removed   Chronic kidney disease    kidney stones   Depression 08/31/2013   Dyslipidemia, goal LDL below 160 08/31/2013   Essential hypertension - well controlled 08/31/2013   Family history of breast cancer    Family history of melanoma    Family history of prostate cancer    GERD (gastroesophageal reflux disease)    Goals of care, counseling/discussion 03/11/2017   History of radiation therapy 10/13/17-11/11/17   left breast 40.05 Gy in 15 fractions, left breast boost 10 Gy in 5 fractions   Hypothyroidism    Ischemic stroke [facility])    May 2023;   Obesity (BMI 30-39.9) 08/31/2013   Personal history of chemotherapy 2018   Personal history of radiation therapy 2019   Thyroid disease    Vasovagal near-syncope -rare 08/31/2013   Past Surgical History:  Procedure Laterality Date   ABDOMINAL HYSTERECTOMY     total   BREAST BIOPSY Left 02/24/2017    malignant   BREAST LUMPECTOMY Left 03/23/2017   broken fingers     CATARACT EXTRACTION Bilateral    COLONOSCOPY     COLONOSCOPY WITH PROPOFOL N/A 06/03/2022   Procedure: COLONOSCOPY WITH  PROPOFOL;  Surgeon: [name], MD;  Location: [facility];  Service: Gastroenterology;  Laterality: N/A;   ESOPHAGOGASTRODUODENOSCOPY (EGD) WITH PROPOFOL N/A 05/29/2022   Procedure: ESOPHAGOGASTRODUODENOSCOPY (EGD) WITH PROPOFOL;  Surgeon: Jerene Bears, MD;  Location: [facility];  Service: Gastroenterology;  Laterality: N/A;   EXCISIONAL HEMORRHOIDECTOMY     IR FLUORO GUIDE PORT INSERTION RIGHT  08/04/2017   IR REMOVAL TUN ACCESS W/ PORT W/O FL MOD SED  08/04/2017   IR US GUIDE VASC ACCESS RIGHT  08/04/2017   IRRIGATION AND DEBRIDEMENT ABSCESS Left 05/18/2017   Procedure: IRRIGATION AND DEBRIDEMENT LEFT AXILLARY ABSCESS;  Surgeon: Michael Boston, MD;  Location: [facility];  Service: General;  Laterality: Left;   kidney stones lithotripsy     POLYPECTOMY  06/03/2022   Procedure: POLYPECTOMY;  Surgeon: [name], MD;  Location: [facility];  Service: Gastroenterology;;   PORTACATH PLACEMENT Right 03/23/2017   Procedure: INSERTION PORT-A-CATH;  Surgeon: Erroll Luna, MD;  Location: [place];  Service: General;  Laterality: Right;   RADIOACTIVE SEED GUIDED PARTIAL MASTECTOMY WITH AXILLARY SENTINEL LYMPH NODE BIOPSY Left 03/23/2017   Procedure: LEFT BREAST RADIOACTIVE SEED GUIDED PARTIAL MASTECTOMY AND  SENTINEL LYMPH NODE [place];  Surgeon: Erroll Luna, MD;  Location: [place];  Service: General;  Laterality: Left;   ROTATOR CUFF REPAIR     left    TONSILLECTOMY     WISDOM TOOTH EXTRACTION     Patient Active Problem List   Diagnosis Date Noted   Altered mental status 06/11/2022   Acute encephalopathy 06/10/2022   UTI (urinary tract infection) 06/10/2022   Constipation    History of stroke in adulthood 06/05/2022   Heme positive stool    Iron deficiency anemia due to chronic blood loss    Left thalamic infarction ([place]) 03/23/2022   Acquired hammer toe of right foot 08/19/2021   Adverse reaction to ACE-I (angiotensin-converting enzyme  inhibitor) 08/19/2021   Allergic rhinitis 08/19/2021   Anxiety 08/19/2021   Asthma 08/19/2021   Cardiomegaly 08/19/2021   Dependence on other enabling machines and devices 08/19/2021   History of adenomatous polyp of colon 08/19/2021   History of pneumonia 08/19/2021   Internal hemorrhoid 08/19/2021   Iron deficiency anemia secondary to inadequate dietary iron intake 08/19/2021   Osteopenia 08/19/2021   Personal history of malignant neoplasm of breast 08/19/2021   Somnolence 08/19/2021   Vitamin D deficiency 08/19/2021   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   Insomnia 06/25/2019   Obstructive sleep apnea 06/13/2019   Genetic testing 06/29/2017   Family history of melanoma    Family history of prostate cancer    Family history of breast cancer    Angioedema 05/26/2017   Left axillary seroma status post incision and drainage 05/18/2017 05/18/2017   Cellulitis of left axilla 05/17/2017   GERD (gastroesophageal reflux disease) 05/16/2017   Acquired hypothyroidism 05/16/2017   Depression with anxiety 05/16/2017   Breast cancer of upper-inner quadrant of left female breast ([place]) 03/11/2017   Goals of care, counseling/discussion 03/11/2017   Obesity (BMI 30-39.9) 08/31/2013   Essential hypertension - well controlled 08/31/2013    Class: Diagnosis of   Depression 08/31/2013    Class: Diagnosis of   Vasovagal near-syncope -rare 08/31/2013    Class: History of   Dyslipidemia, goal LDL below 160 08/31/2013    ONSET DATE: 03/20/22  REFERRING DIAG: I63.50 (ICD-10-CM) - Cerebrovascular accident (CVA) due to occlusion of cerebral artery  THERAPY DIAG:  Hemiplegia and hemiparesis following cerebral infarction affecting left non-dominant side (HCC)  Muscle weakness (generalized)  Unsteadiness on feet  Rationale for Evaluation and Treatment: Rehabilitation  SUBJECTIVE:   SUBJECTIVE STATEMENT: [name] daughter reports that pt has been having more bladder accidents. Pt accompanied by: self  and family member (daughter, [name])  PERTINENT HISTORY: Past medical history of triple negative stage IIa ductal carcinoma of the left breast cancer s/p partial mastectomy 2018, HLD, HTN, CVA May 2023 with R residual deficits and expressive aphaisa.  PRECAUTIONS: Shoulder  WEIGHT BEARING RESTRICTIONS: No  PAIN:  Are you having pain? Yes: NPRS scale: 3/10 Pain location: R shoulder Pain description: sore Aggravating factors: overhead movement Relieving factors: rest  FALLS: Has patient fallen in last 6 months? Yes. Number of falls 1 fall (and another near fall)  LIVING ENVIRONMENT: Lives with: lives with their family (living with daughter Judeen Hammans since [place]) Lives in: House/apartment Stairs: Yes: External: 1 high brick steps; none Has following equipment at home: Lobbyist, Environmental consultant - 2 wheeled, Environmental consultant - 4 wheeled, shower chair, and Grab bars  PLOF: Independent  PATIENT GOALS: to be like I was before  OBJECTIVE:   HAND DOMINANCE: Right  ADLs: Overall ADLs: aide assists with  majority of bathing/dressing Transfers/ambulation related to ADLs: Supervision with RW in home, Rollator in community Eating: difficulty with cutting foods, spilling food off utensil due to inconsistent tremor like movements Grooming: Supervision UB Dressing: Max assist LB Dressing: Max assist Toileting: assist to pull up pants after toileting Bathing: Mod assist Tub Shower transfers: Min - HHA with transfers in/out of tub/shower Equipment: Shower seat with back and Grab bars  IADLs: Meal Prep: will occasionally pour cereal or fix oatmeal, aide does majority of other meal tasks Medication management: [facility] prepares packages of meds as prescribed and caregiver hands them to pt to take  UPPER EXTREMITY ROM:    Active ROM Right eval Left eval  Shoulder flexion 84 (h/o shoulder injury and surgery) 95  Shoulder abduction    Shoulder adduction    Shoulder extension    Shoulder internal  rotation    Shoulder external rotation    Elbow flexion 80*   Elbow extension -10*   Wrist flexion    Wrist extension    Wrist ulnar deviation    Wrist radial deviation    Wrist pronation    Wrist supination    (Blank rows = not tested)  UPPER EXTREMITY MMT:   not tested due to pain with AROM and attempts at PROM   COORDINATION: 9 Hole Peg test: Right: 53.41 sec; Left: 58.41 sec Box and Blocks:  Right 21 blocks, Left 23 blocks   OBSERVATIONS: Pt with flat affect and somewhat withdrawn.  [name] daughter reports that pt was able to complete ADLs at higher level after leaving [facility] but has allowed aide to assist with ADLs and provide hand held assist with transitional movements.   TODAY'S TREATMENT:                                                                        11/11/22 UB dressing: completed massed practice with donning/doffing pullover shirt.   Toileting:     11/09/22 Self-care: discussed circumstances around bladder accidents and whether urgency or waiting too long to begin process of bathroom transfers.  Engaged in discussion about RW placement when seated to ensure safety when getting up to go to the bathroom as family member or caregiver frequently having to bring RW to pt prior to ambulating in home.  Engaged in simulated home setup to increase carryover of RW placement prior to and during mobility. Coordination: incorporating functional reach to facilitate increased anterior weight shifting as daughter and caregiver reporting that pt frequently spilling items when feeding herself.  Noted some shakiness in BUE, R > L with reaching and manipulation of large pegs.  Discussed sitting posture and placement of body within proximity of table top and option of sitting at a different table/chair for increased posture. UB dressing: OT providing demonstration of hand placement to increase ease and independence with UB dressing as pt demonstrating decreased sequencing and difficulty  due to limited shoulder ROM.  Pt to bring additional pull over shirt to next visit to practice UB dressing.  Reiterated UE exercises with focus on keeping elbows close to body during bag exercises to carry over to UB dressing.    11/04/22 Self-care: engaged in length conversation about circumstances surrounding fall in bathroom.  OT identifying  fall risks during particular situation. OT educated on fall risk and suggestions to decrease fall risk, such as ensuring appropriate footwear, no pants under feet, removal of rugs, proper hand placement with sit > stand, question cues from caregivers prior to physical assistance, etc.  Pt reports that she feels like her daughters are always telling her what she is doing wrong, encouraged positive reinforcement as well as pt to assess situation prior to standing.      Transitional movements: engaged in sit > stand from various height surfaces and with various hand placements to increase awareness of positioning and allow for sequencing.  On one instance, pt initially placed B hands on Rollator but corrected hand placement prior to sit > stand.  Pt demonstrating good technique and awareness with transitional movements this session.    10/19/22 LB dressing: massed practice with threading BLE and pulling pants up/down over hips with use of gait belt to simulate pants.  OT providing min cues and allowing increased time for pt to problem solve challenges.  Pt able to recall technique from previous session with min cues to increase reach in back.  Pt demonstrating improved ease with sit > stand throughout session. ADL: pt's caregiver present during session and reporting that she did not help her with clothing management during toileting tasks this session, but continues to provide supervision.  Pt reports that she feels that one of her daughters helped her too much in the bathroom.  Engaged in discussion about providing verbal cues to aid pt initially before physically  assisting with clothing management.  Also discussed decreasing physical assistance while still providing supervision to allow pt to feel increased independence. UB dressing: engaged in bag exercise with use of BUE to advance bag over head and back to simulate donning shirt.  Pt demonstrating decreased shoulder flexion Bilaterally, L > R.  OT providing demonstration of technique and hand placement to facilitate increased ease and ROM with UB dressing to include forward reach to allow for increased shoulder flexion.   PATIENT EDUCATION: Education details: fall risk, and safety awareness to reduce falls Person educated: Patient and Child(ren) Education method: Customer service manager Education comprehension: verbalized understanding and needs further education  HOME EXERCISE PROGRAM: TBD   GOALS: Goals reviewed with patient? Yes  SHORT TERM GOALS: Target date: 11/13/22  Pt  and caregiver will verbalize understanding of task modifications and/or potential AE needs to increase ease, safety, and independence w/ ADLs. Baseline: Goal status: MET - 11/11/22  2.  Pt will complete toilet transfer with supervision and no verbal cues 5 out of 6 attempts to demonstrate improved mobility and progress to decreasing level of supervision. Baseline: currently CGA to min assist Goal status: MET - 11/11/22  3.  Pt will be able to manage clothing up/down for toileting with distant supervision to progress to increased independence with toileting tasks. Baseline: currently min-mod assist Goal status: MET - 11/09/22  4.  Pt will be able to complete UB dressing with supervision assist (with exception of bra). Baseline: currently max assist Goal status: MET - 11/11/22  LONG TERM GOALS: Target date: 12/11/22  Pt will be able to complete LB dressing with supervision, to include donning socks and shoes. Baseline: currently max assist Goal status: IN PROGRESS  2.  Pt will be able to don bra at Mod I  level. Baseline: unable to complete Goal status: IN PROGRESS  3.  Pt will be able to complete all aspects of toileting (transfer, clothing management, and hygiene)  at Mod I level with LRAD. Baseline: Min-mod assist Goal status: IN PROGRESS  4.  Pt will be able to maintain dynamic standing balance for 10 mins w/o LOB using DME and/or countertop support prn to increase ease, safety, and independence with engagement in meal prep tasks. Baseline:  Goal status: IN PROGRESS  5.  Patient will demonstrate sufficient range of motion and activity tolerance to use BUE to shampoo her hair in the shower  Baseline: RUE 84* and LUE 95* shoulder ROM Goal status: IN PROGRESS   ASSESSMENT:  CLINICAL IMPRESSION: Pt continues to report feeling that her family and caregiver provide inconsistent styles of assistance.  Pt with little carryover from previous sessions.  Pt may benefit from increased written instructions as pt with different family members and caregivers providing assistance at home.    PERFORMANCE DEFICITS: in functional skills including ADLs, IADLs, coordination, ROM, strength, pain, flexibility, Gross motor control, balance, body mechanics, endurance, decreased knowledge of precautions, decreased knowledge of use of DME, and UE functional use, cognitive skills including emotional, energy/drive, problem solving, safety awareness, and sequencing, and psychosocial skills including coping strategies, environmental adaptation, habits, and routines and behaviors.   IMPAIRMENTS: are limiting patient from ADLs and IADLs.   CO-MORBIDITIES: may have co-morbidities  that affects occupational performance. Patient will benefit from skilled OT to address above impairments and improve overall function.  MODIFICATION OR ASSISTANCE TO COMPLETE EVALUATION: Min-Moderate modification of tasks or assist with assess necessary to complete an evaluation.  OT OCCUPATIONAL PROFILE AND HISTORY: Problem focused  assessment: Including review of records relating to presenting problem.  CLINICAL DECISION MAKING: Moderate - several treatment options, min-mod task modification necessary  REHAB POTENTIAL: Good  EVALUATION COMPLEXITY: Moderate    PLAN:  OT FREQUENCY: 2x/week  OT DURATION: 8 weeks  PLANNED INTERVENTIONS: self care/ADL training, therapeutic exercise, therapeutic activity, neuromuscular re-education, manual therapy, passive range of motion, balance training, functional mobility training, ultrasound, compression bandaging, moist heat, cryotherapy, cognitive remediation/compensation, psychosocial skills training, energy conservation, coping strategies training, and DME and/or AE instructions  RECOMMENDED OTHER SERVICES: NA  CONSULTED AND AGREED WITH PLAN OF CARE: Patient and family member/caregiver  PLAN FOR NEXT SESSION: Reiterate sit > stand and pulling pants up/down, short distance ambulatory transfers as needed to increase independence with toileting.  UB ROM exercises for increased ease, safety, independence with UB dressing - simulated UB dressing at next session.   Simonne Come, OTR/L 11/11/2022, 3:43 PM

## 2022-11-12 NOTE — Therapy (Signed)
OUTPATIENT SPEECH LANGUAGE PATHOLOGY TREATMENT   Patient Name: Jessica [name] MRN: [id number] DOB:October 17, 1941, 82 y.o., female Today's Date: 11/12/2022  PCP: [name], MD  REFERRING PROVIDER: Heywood Iles, MD  END OF SESSION:  End of Session - 11/12/22 0806     Visit Number 5    Number of Visits 25    Date for SLP Re-Evaluation 01/11/23    SLP Start Time 1450    SLP Stop Time  1532    SLP Time Calculation (min) 42 min    Activity Tolerance Patient tolerated treatment well                 Past Medical History:  Diagnosis Date   Allergy    Anemia    Anxiety    Asthma    in past, no inhalers now   Blood transfusion without reported diagnosis    Breast cancer ([place]) 01/2017   left breast    Breast cancer of upper-inner quadrant of left female breast ([place]) 03/11/2017   Cancer ([place]) 01/2017   left breast   Cataract    bilateral removed   Chronic kidney disease    kidney stones   Depression 08/31/2013   Dyslipidemia, goal LDL below 160 08/31/2013   Essential hypertension - well controlled 08/31/2013   Family history of breast cancer    Family history of melanoma    Family history of prostate cancer    GERD (gastroesophageal reflux disease)    Goals of care, counseling/discussion 03/11/2017   History of radiation therapy 10/13/17-11/11/17   left breast 40.05 Gy in 15 fractions, left breast boost 10 Gy in 5 fractions   Hypothyroidism    Ischemic stroke [facility])    May 2023;   Obesity (BMI 30-39.9) 08/31/2013   Personal history of chemotherapy 2018   Personal history of radiation therapy 2019   Thyroid disease    Vasovagal near-syncope -rare 08/31/2013   Past Surgical History:  Procedure Laterality Date   ABDOMINAL HYSTERECTOMY     total   BREAST BIOPSY Left 02/24/2017    malignant   BREAST LUMPECTOMY Left 03/23/2017   broken fingers     CATARACT EXTRACTION Bilateral    COLONOSCOPY     COLONOSCOPY WITH PROPOFOL N/A 06/03/2022   Procedure:  COLONOSCOPY WITH PROPOFOL;  Surgeon: [name], MD;  Location: [facility];  Service: Gastroenterology;  Laterality: N/A;   ESOPHAGOGASTRODUODENOSCOPY (EGD) WITH PROPOFOL N/A 05/29/2022   Procedure: ESOPHAGOGASTRODUODENOSCOPY (EGD) WITH PROPOFOL;  Surgeon: Jerene Bears, MD;  Location: [facility];  Service: Gastroenterology;  Laterality: N/A;   EXCISIONAL HEMORRHOIDECTOMY     IR FLUORO GUIDE PORT INSERTION RIGHT  08/04/2017   IR REMOVAL TUN ACCESS W/ PORT W/O FL MOD SED  08/04/2017   IR US GUIDE VASC ACCESS RIGHT  08/04/2017   IRRIGATION AND DEBRIDEMENT ABSCESS Left 05/18/2017   Procedure: IRRIGATION AND DEBRIDEMENT LEFT AXILLARY ABSCESS;  Surgeon: Michael Boston, MD;  Location: [facility];  Service: General;  Laterality: Left;   kidney stones lithotripsy     POLYPECTOMY  06/03/2022   Procedure: POLYPECTOMY;  Surgeon: [name], MD;  Location: [facility];  Service: Gastroenterology;;   PORTACATH PLACEMENT Right 03/23/2017   Procedure: INSERTION PORT-A-CATH;  Surgeon: Erroll Luna, MD;  Location: [place];  Service: General;  Laterality: Right;   RADIOACTIVE SEED GUIDED PARTIAL MASTECTOMY WITH AXILLARY SENTINEL LYMPH NODE BIOPSY Left 03/23/2017   Procedure: LEFT BREAST RADIOACTIVE SEED GUIDED PARTIAL MASTECTOMY AND  SENTINEL LYMPH  NODE MAPPING;  Surgeon: Erroll Luna, MD;  Location: [place];  Service: General;  Laterality: Left;   ROTATOR CUFF REPAIR     left    TONSILLECTOMY     WISDOM TOOTH EXTRACTION     Patient Active Problem List   Diagnosis Date Noted   Altered mental status 06/11/2022   Acute encephalopathy 06/10/2022   UTI (urinary tract infection) 06/10/2022   Constipation    History of stroke in adulthood 06/05/2022   Heme positive stool    Iron deficiency anemia due to chronic blood loss    Left thalamic infarction ([place]) 03/23/2022   Acquired hammer toe of right foot 08/19/2021   Adverse reaction to ACE-I  (angiotensin-converting enzyme inhibitor) 08/19/2021   Allergic rhinitis 08/19/2021   Anxiety 08/19/2021   Asthma 08/19/2021   Cardiomegaly 08/19/2021   Dependence on other enabling machines and devices 08/19/2021   History of adenomatous polyp of colon 08/19/2021   History of pneumonia 08/19/2021   Internal hemorrhoid 08/19/2021   Iron deficiency anemia secondary to inadequate dietary iron intake 08/19/2021   Osteopenia 08/19/2021   Personal history of malignant neoplasm of breast 08/19/2021   Somnolence 08/19/2021   Vitamin D deficiency 08/19/2021   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   Insomnia 06/25/2019   Obstructive sleep apnea 06/13/2019   Genetic testing 06/29/2017   Family history of melanoma    Family history of prostate cancer    Family history of breast cancer    Angioedema 05/26/2017   Left axillary seroma status post incision and drainage 05/18/2017 05/18/2017   Cellulitis of left axilla 05/17/2017   GERD (gastroesophageal reflux disease) 05/16/2017   Acquired hypothyroidism 05/16/2017   Depression with anxiety 05/16/2017   Breast cancer of upper-inner quadrant of left female breast ([place]) 03/11/2017   Goals of care, counseling/discussion 03/11/2017   Obesity (BMI 30-39.9) 08/31/2013   Essential hypertension - well controlled 08/31/2013    Class: Diagnosis of   Depression 08/31/2013    Class: Diagnosis of   Vasovagal near-syncope -rare 08/31/2013    Class: History of   Dyslipidemia, goal LDL below 160 08/31/2013    ONSET DATE: May 2023   REFERRING DIAG: I63.50 (ICD-10-CM) - Cerebrovascular accident (CVA) due to occlusion of cerebral artery ([place])  THERAPY DIAG:  Cognitive communication deficit  Dysarthria and anarthria  Aphasia  Rationale for Evaluation and Treatment: Rehabilitation  SUBJECTIVE:   SUBJECTIVE STATEMENT: Pt somewhat flat affect today. Better after approx 15 minutes."I'd give anything to drive my car."  Pt accompanied by:  self     PERTINENT HISTORY:  Presented 03/20/2022 with right-sided weakness slurred speech and altered mental status with right facial droop of acute onset.  CT/MRI showed small focus of acute ischemia within the left caudate body.  No hemorrhage or mass effect.  Old right basal ganglia small vessel infarct and findings of chronic microvascular disease.  Patient did not receive tPA.  CT angiogram head and neck no emergent large vessel occlusion.  Echocardiogram ejection fraction of 60 to 65% no wall motion abnormalities.  Pt was admitted to [facility] on 05/28/2022 with suspected acute lower gastrointestinal bleed complicated by acute blood loss anemia after presenting from home to [facility] ED complaining of low hemoglobin per outpatient labs.  In September 2023 Lillias was brought by her daughters after a fall at home. She was leaning over to pick up a piece of paper and lost her balance hitting her nose and L cheek on the ground. Unsure  if she also hit her head. Did not have LOC. Not on a blood thinner. No other reports of injuries.  She rec'd HHST, which, according to daughter focused mostly on swallowing.  PAIN:  Are you having pain? No  PATIENT GOALS: Improve memory  OBJECTIVE:   DIAGNOSTIC FINDINGS:  07/07/22 - IMPRESSION: 1. No acute intracranial abnormality. No skull fracture. 2. Stable atrophy, chronic small vessel ischemia, and remote bilateral basal gangliar lacunar infarcts.  PATIENT REPORTED OUTCOME MEASURES (PROM): Cognitive Function:   and Communication Participation Item Bank: to be provided.   TODAY'S TREATMENT:                                                                                                                                         DATE:  11/11/22: SLP asked pt who she wanted to attend speech therapy with her. Pt did not indicate [name] nor [name] so SLP took pt by herself. Pt was most talkative she has been today with SLP in [place] room. Pt conveyed feelings of hopelessness  and despair, but no mention of harming herself. SLP believes pt is having trouble dealing with feelings about the decreasing of her independence and changes due to CVA. SLP used skilled observation to ascertain pt's aphasia is not as severe as originally thought - pt was not talkative up to now likely due to psychological factors. No difficulty finding words or generating spoken language today. Pt stated the calendar is helpful in her memory book. With initial cues to find today's date she told SLP her schedule today, what she had remaining, her next therapy date and time for ST. She would like to have more things on her calendar and SLP suggested ANYTHING that includes pt should be added - SLP shared this with aide and daughter at end of session. Pt talked about wanting to do more but thinking her daughters were too busy or would not allow it, or wouldn't allow the extent of the activity premorbidly. SLP assisted pt in some anticipatory awareness and after more detailed explanation to rationale pt stated she understood.   12/08/81: SLP ascertained table tent for "medicine" still not being used. [name] stated they will begin to do this today. SLP cued pt about her memory notebook, which pt showed SLP correct tab to find PT exercises, OT exercises and ST handout - all with extra time. SLP, throughout session, provided education for Kim for pt to set timers, keep a log of daily activities, cross off calendar days, and to request her medication after her meals. SLP educated pt and [name] about memory strategies.   11/04/22: Pt did not have opportunity to use table tent as [name] had family emergency and she could not communicate info to [place] to put into practice. SLP encouraged this to occur soon. [name] brought a 3 ring binder with notebook paper, a loose calendar with a pen, and all therapy  papers in a pocket. SLP had pt pt write tabs ST PT and OT - pt wrote 2/3 legibly. She organized with mod A (put OT HEP with her PT  HEP), but separated ST papers correctly. SLP provided a calendar of January 2024 and suggested that [name] write the therapies in the dates and times to which Chalice tells her. SLP highlighted that we want Elantra to do as much as she can safely do - due to entries on the loose calendar being ~90% illegible.  SLP strongly suggested [name] attend a therapy session for carryover of OT and PT exercises. SLP req'd to ask Pixie to repeat 25% of her utterances. SLP not convinced pt desires ST after today's session. SLP to cont to develop rapport with pt and highlight benefits of ST to pt's rehab journey. Memory strategies to be discussed next session as SLP targeted memory notebook/system today as daughter indicated it was brought to ST.  10/16/22: Will discuss memory compensations further next session. Discussed pt's medication and timing with pt today. Jazilyn told SLP incorrect schedule apart from mealtimes and bedtime. [name] assisted. SLP suggested table tent for med reminder to tell Judeen Hammans it was time for medication administration. Pt shared she does not like taste of Potassium and feeds it to the dog. SLP assisted problem solving with pt and daughter about how to achieve goal of taking Potassium and also having alternative to taste (brand with heavier coating). SLP suggested calendar for pt to view appointments - dtr to buy calendar and put appointments on it.  10/13/22: SLP discussed findings so far in evaluation. SLP will need to perform further assessment once rapport is developed with pt as she did not freely offer much information today SLP suspects due to reason that she does not desire tx for dysarthria. During evaluation pt stated she has trouble with memory and so SLP assessed memory. SLP hopes to eventually, as pt warms to him and ST in general, target pt's soft conversational speech.  PATIENT EDUCATION: Education details: as above in "today's treatment" Person educated: Patient and  Child(ren) Education method: Explanation Education comprehension: verbalized understanding and needs further education   GOALS: Goals reviewed with patient? Yes  SHORT TERM GOALS: Target date: 11-20-22  Pt will bring memory compensation device/system to 60% of ST sessions Baseline: (3/3) Goal status: Ongoing  2.  Pt will indicate knowledge she can use memory system for functional tasks (I.e., will open/acess system when asked her therapy schedule) x 3 sessions Baseline:  Goal status: Ongoing  3.  Undergo more in depth cognitive communication testing in first 4 sessions Baseline:  Goal status: Ongoing  4.  Kasee will indicate she has told daughter to administer her meds for 7 consecutive days Baseline:  Goal status: Ongoing   LONG TERM GOALS: Target date: 01/10/23  Jonika will track appointments and other social events using compensations in 5 sessions Baseline:  Goal status: Ongoing  2.  Parisa will indicate she has administered her meds correctly with SBA for 7 consecutive days Baseline:  Goal status: Ongoing  3.  Annayah will indicate she feels more confident talking than initial sessions by improved PRM score/s Baseline:  Goal status: Ongoing   ASSESSMENT:  CLINICAL IMPRESSION: Patient is a 82 y.o. female who was seen today for treatment of memory in language. Discussion on memory compensation system completed today with pt's memory book. Voice volume was slightly higher today with Kim in the room.    OBJECTIVE IMPAIRMENTS: include attention, memory, aphasia,  and dysarthria. These impairments are limiting patient from managing medications, managing appointments, managing finances, household responsibilities, ADLs/IADLs, and effectively communicating at home and in community. Factors affecting potential to achieve goals and functional outcome are ability to learn/carryover information and cooperation/participation level.. Patient will benefit from skilled SLP  services to address above impairments and improve overall function.  REHAB POTENTIAL: Fair given pt's motivation/response to SLP about desire for ST today during eval  PLAN:  SLP FREQUENCY: 2x/week  SLP DURATION: 12 weeks  PLANNED INTERVENTIONS: Language facilitation, Environmental controls, Cueing hierachy, Cognitive reorganization, Internal/external aids, Oral motor exercises, Functional tasks, Multimodal communication approach, SLP instruction and feedback, Compensatory strategies, and Patient/family education    [facility], [place] 11/12/2022, 8:07 AM

## 2022-11-16 ENCOUNTER — Ambulatory Visit: Payer: Medicare Other

## 2022-11-16 ENCOUNTER — Ambulatory Visit: Payer: Medicare Other | Admitting: Occupational Therapy

## 2022-11-16 ENCOUNTER — Encounter: Payer: Self-pay | Admitting: Physical Therapy

## 2022-11-16 ENCOUNTER — Ambulatory Visit: Payer: Medicare Other | Admitting: Physical Therapy

## 2022-11-16 DIAGNOSIS — R471 Dysarthria and anarthria: Secondary | ICD-10-CM

## 2022-11-16 DIAGNOSIS — R4701 Aphasia: Secondary | ICD-10-CM | POA: Diagnosis not present

## 2022-11-16 DIAGNOSIS — M6281 Muscle weakness (generalized): Secondary | ICD-10-CM

## 2022-11-16 DIAGNOSIS — I69354 Hemiplegia and hemiparesis following cerebral infarction affecting left non-dominant side: Secondary | ICD-10-CM | POA: Diagnosis not present

## 2022-11-16 DIAGNOSIS — R2689 Other abnormalities of gait and mobility: Secondary | ICD-10-CM

## 2022-11-16 DIAGNOSIS — R2681 Unsteadiness on feet: Secondary | ICD-10-CM | POA: Diagnosis not present

## 2022-11-16 DIAGNOSIS — R41841 Cognitive communication deficit: Secondary | ICD-10-CM

## 2022-11-16 DIAGNOSIS — R293 Abnormal posture: Secondary | ICD-10-CM | POA: Diagnosis not present

## 2022-11-16 NOTE — Therapy (Signed)
OUTPATIENT OCCUPATIONAL THERAPY  Treatment Note  Patient Name: Jessica [name] MRN: [id number] DOB:1941/02/09, 82 y.o., female Today's Date: 11/16/2022  PCP: [name], MD REFERRING PROVIDER: Izora Ribas, MD  END OF SESSION:  OT End of Session - 11/16/22 1537     Visit Number 7    Number of Visits 17    Date for OT Re-Evaluation 12/11/22    Authorization Type [employer]    OT Start Time 0300    OT Stop Time 1447    OT Time Calculation (min) 45 min                   Past Medical History:  Diagnosis Date   Allergy    Anemia    Anxiety    Asthma    in past, no inhalers now   Blood transfusion without reported diagnosis    Breast cancer ([place]) 01/2017   left breast    Breast cancer of upper-inner quadrant of left female breast ([place]) 03/11/2017   Cancer ([place]) 01/2017   left breast   Cataract    bilateral removed   Chronic kidney disease    kidney stones   Depression 08/31/2013   Dyslipidemia, goal LDL below 160 08/31/2013   Essential hypertension - well controlled 08/31/2013   Family history of breast cancer    Family history of melanoma    Family history of prostate cancer    GERD (gastroesophageal reflux disease)    Goals of care, counseling/discussion 03/11/2017   History of radiation therapy 10/13/17-11/11/17   left breast 40.05 Gy in 15 fractions, left breast boost 10 Gy in 5 fractions   Hypothyroidism    Ischemic stroke [facility])    May 2023;   Obesity (BMI 30-39.9) 08/31/2013   Personal history of chemotherapy 2018   Personal history of radiation therapy 2019   Thyroid disease    Vasovagal near-syncope -rare 08/31/2013   Past Surgical History:  Procedure Laterality Date   ABDOMINAL HYSTERECTOMY     total   BREAST BIOPSY Left 02/24/2017    malignant   BREAST LUMPECTOMY Left 03/23/2017   broken fingers     CATARACT EXTRACTION Bilateral    COLONOSCOPY     COLONOSCOPY WITH PROPOFOL N/A 06/03/2022   Procedure: COLONOSCOPY WITH  PROPOFOL;  Surgeon: [name], MD;  Location: [facility];  Service: Gastroenterology;  Laterality: N/A;   ESOPHAGOGASTRODUODENOSCOPY (EGD) WITH PROPOFOL N/A 05/29/2022   Procedure: ESOPHAGOGASTRODUODENOSCOPY (EGD) WITH PROPOFOL;  Surgeon: Jerene Bears, MD;  Location: [facility];  Service: Gastroenterology;  Laterality: N/A;   EXCISIONAL HEMORRHOIDECTOMY     IR FLUORO GUIDE PORT INSERTION RIGHT  08/04/2017   IR REMOVAL TUN ACCESS W/ PORT W/O FL MOD SED  08/04/2017   IR US GUIDE VASC ACCESS RIGHT  08/04/2017   IRRIGATION AND DEBRIDEMENT ABSCESS Left 05/18/2017   Procedure: IRRIGATION AND DEBRIDEMENT LEFT AXILLARY ABSCESS;  Surgeon: Michael Boston, MD;  Location: [facility];  Service: General;  Laterality: Left;   kidney stones lithotripsy     POLYPECTOMY  06/03/2022   Procedure: POLYPECTOMY;  Surgeon: [name], MD;  Location: [facility];  Service: Gastroenterology;;   PORTACATH PLACEMENT Right 03/23/2017   Procedure: INSERTION PORT-A-CATH;  Surgeon: Erroll Luna, MD;  Location: [place];  Service: General;  Laterality: Right;   RADIOACTIVE SEED GUIDED PARTIAL MASTECTOMY WITH AXILLARY SENTINEL LYMPH NODE BIOPSY Left 03/23/2017   Procedure: LEFT BREAST RADIOACTIVE SEED GUIDED PARTIAL MASTECTOMY AND  SENTINEL LYMPH NODE  MAPPING;  Surgeon: Erroll Luna, MD;  Location: [place];  Service: General;  Laterality: Left;   ROTATOR CUFF REPAIR     left    TONSILLECTOMY     WISDOM TOOTH EXTRACTION     Patient Active Problem List   Diagnosis Date Noted   Altered mental status 06/11/2022   Acute encephalopathy 06/10/2022   UTI (urinary tract infection) 06/10/2022   Constipation    History of stroke in adulthood 06/05/2022   Heme positive stool    Iron deficiency anemia due to chronic blood loss    Left thalamic infarction ([place]) 03/23/2022   Acquired hammer toe of right foot 08/19/2021   Adverse reaction to ACE-I (angiotensin-converting enzyme  inhibitor) 08/19/2021   Allergic rhinitis 08/19/2021   Anxiety 08/19/2021   Asthma 08/19/2021   Cardiomegaly 08/19/2021   Dependence on other enabling machines and devices 08/19/2021   History of adenomatous polyp of colon 08/19/2021   History of pneumonia 08/19/2021   Internal hemorrhoid 08/19/2021   Iron deficiency anemia secondary to inadequate dietary iron intake 08/19/2021   Osteopenia 08/19/2021   Personal history of malignant neoplasm of breast 08/19/2021   Somnolence 08/19/2021   Vitamin D deficiency 08/19/2021   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   Insomnia 06/25/2019   Obstructive sleep apnea 06/13/2019   Genetic testing 06/29/2017   Family history of melanoma    Family history of prostate cancer    Family history of breast cancer    Angioedema 05/26/2017   Left axillary seroma status post incision and drainage 05/18/2017 05/18/2017   Cellulitis of left axilla 05/17/2017   GERD (gastroesophageal reflux disease) 05/16/2017   Acquired hypothyroidism 05/16/2017   Depression with anxiety 05/16/2017   Breast cancer of upper-inner quadrant of left female breast ([place]) 03/11/2017   Goals of care, counseling/discussion 03/11/2017   Obesity (BMI 30-39.9) 08/31/2013   Essential hypertension - well controlled 08/31/2013    Class: Diagnosis of   Depression 08/31/2013    Class: Diagnosis of   Vasovagal near-syncope -rare 08/31/2013    Class: History of   Dyslipidemia, goal LDL below 160 08/31/2013    ONSET DATE: 03/20/22  REFERRING DIAG: I63.50 (ICD-10-CM) - Cerebrovascular accident (CVA) due to occlusion of cerebral artery  THERAPY DIAG:  Hemiplegia and hemiparesis following cerebral infarction affecting left non-dominant side (HCC)  Muscle weakness (generalized)  Unsteadiness on feet  Rationale for Evaluation and Treatment: Rehabilitation  SUBJECTIVE:   SUBJECTIVE STATEMENT: [name] daughter reports pt able to get self completely dressed on Saturday.   Pt accompanied  by: self and daughter, [name]  PERTINENT HISTORY: Past medical history of triple negative stage IIa ductal carcinoma of the left breast cancer s/p partial mastectomy 2018, HLD, HTN, CVA May 2023 with R residual deficits and expressive aphaisa.  PRECAUTIONS: Shoulder  WEIGHT BEARING RESTRICTIONS: No  PAIN:  Are you having pain? Yes: NPRS scale: 3/10 Pain location: R shoulder Pain description: sore Aggravating factors: overhead movement Relieving factors: rest  FALLS: Has patient fallen in last 6 months? Yes. Number of falls 1 fall (and another near fall)  LIVING ENVIRONMENT: Lives with: lives with their family (living with daughter Judeen Hammans since [place]) Lives in: House/apartment Stairs: Yes: External: 1 high brick steps; none Has following equipment at home: Lobbyist, Environmental consultant - 2 wheeled, Environmental consultant - 4 wheeled, shower chair, and Grab bars  PLOF: Independent  PATIENT GOALS: to be like I was before  OBJECTIVE:   HAND DOMINANCE: Right  ADLs: Overall ADLs:  aide assists with majority of bathing/dressing Transfers/ambulation related to ADLs: Supervision with RW in home, Rollator in community Eating: difficulty with cutting foods, spilling food off utensil due to inconsistent tremor like movements Grooming: Supervision UB Dressing: Max assist LB Dressing: Max assist Toileting: assist to pull up pants after toileting Bathing: Mod assist Tub Shower transfers: Min - HHA with transfers in/out of tub/shower Equipment: Shower seat with back and Grab bars  IADLs: Meal Prep: will occasionally pour cereal or fix oatmeal, aide does majority of other meal tasks Medication management: [facility] prepares packages of meds as prescribed and caregiver hands them to pt to take  UPPER EXTREMITY ROM:    Active ROM Right eval Left eval  Shoulder flexion 84 (h/o shoulder injury and surgery) 95  Shoulder abduction    Shoulder adduction    Shoulder extension    Shoulder internal rotation     Shoulder external rotation    Elbow flexion 80*   Elbow extension -10*   Wrist flexion    Wrist extension    Wrist ulnar deviation    Wrist radial deviation    Wrist pronation    Wrist supination    (Blank rows = not tested)  UPPER EXTREMITY MMT:   not tested due to pain with AROM and attempts at PROM   COORDINATION: 9 Hole Peg test: Right: 53.41 sec; Left: 58.41 sec Box and Blocks:  Right 21 blocks, Left 23 blocks   OBSERVATIONS: Pt with flat affect and somewhat withdrawn.  [name] daughter reports that pt was able to complete ADLs at higher level after leaving [facility] but has allowed aide to assist with ADLs and provide hand held assist with transitional movements.   TODAY'S TREATMENT:                                                                        11/16/22 Therapeutic exercise: engaged in ball rotation around torso with 1# medicine ball to simulate rotating bra strap around. Pt completed rotations to both R and L with focus on quality of movement.  OT then directed pt to complete with gait belt around upper torso to simulate bra strap. Pt able to complete with increased time and effort. LB dressing: pt's daughter reports that pt was able to don socks and shoes as well as lacing up boots over the weekend.  UB dressing: pt reports that she continues to struggle with hooking bra.  Discussed various other types of bras with recommendation to attempt a front closure bra with only one clasp to increase success.  Pt initially hesitant, but agreeable to attempting.  Discussed appropriate fit of bra as well to ensure ease of donning. Therapist provided written instructions for pt and caregivers to increase carryover of education between variety of caregivers and to decrease burden of care.   11/11/22 UB dressing: completed massed practice with donning/doffing pullover shirt.  Pt able to complete with increased time and min cues for reaching across to opposite shoulder to adjust shirt  when stuck around shoulders/neck.  Pt able to complete with increased ease with repetition.  Pt reports difficulty with donning bra.  Discussed alternative bra types and techniques.  Educated on exercise to simulate sliding bra band around torso with  reaching around back with ball or rotating belt clockwise and counterclockwise around waist.   LB dressing/toileting: pt and caregiver reporting that pt is able to complete clothing management with increased independence, only requiring assist if wearing certain pants that may be too tight.  Leisure pursuits: discussed pursuing previous leisure tasks to increase motivation and engagement in tasks.  Pt enjoys scapbooking.  Discussed setting up a station at home to allow pt to resume this activity as pt continues to demonstrate decreased motivation and engagement in tasks.  Caregiver present and agreeable to assist with setup and engagement for more purposeful tasks.     11/09/22 Self-care: discussed circumstances around bladder accidents and whether urgency or waiting too long to begin process of bathroom transfers.  Engaged in discussion about RW placement when seated to ensure safety when getting up to go to the bathroom as family member or caregiver frequently having to bring RW to pt prior to ambulating in home.  Engaged in simulated home setup to increase carryover of RW placement prior to and during mobility. Coordination: incorporating functional reach to facilitate increased anterior weight shifting as daughter and caregiver reporting that pt frequently spilling items when feeding herself.  Noted some shakiness in BUE, R > L with reaching and manipulation of large pegs.  Discussed sitting posture and placement of body within proximity of table top and option of sitting at a different table/chair for increased posture. UB dressing: OT providing demonstration of hand placement to increase ease and independence with UB dressing as pt demonstrating decreased  sequencing and difficulty due to limited shoulder ROM.  Pt to bring additional pull over shirt to next visit to practice UB dressing.  Reiterated UE exercises with focus on keeping elbows close to body during bag exercises to carry over to UB dressing.  PATIENT EDUCATION: Education details: fall risk, and safety awareness to reduce falls Person educated: Patient and Child(ren) Education method: Customer service manager Education comprehension: verbalized understanding and needs further education  HOME EXERCISE PROGRAM: TBD   GOALS: Goals reviewed with patient? Yes  SHORT TERM GOALS: Target date: 11/13/22  Pt  and caregiver will verbalize understanding of task modifications and/or potential AE needs to increase ease, safety, and independence w/ ADLs. Baseline: Goal status: MET - 11/11/22  2.  Pt will complete toilet transfer with supervision and no verbal cues 5 out of 6 attempts to demonstrate improved mobility and progress to decreasing level of supervision. Baseline: currently CGA to min assist Goal status: MET - 11/11/22  3.  Pt will be able to manage clothing up/down for toileting with distant supervision to progress to increased independence with toileting tasks. Baseline: currently min-mod assist Goal status: MET - 11/09/22  4.  Pt will be able to complete UB dressing with supervision assist (with exception of bra). Baseline: currently max assist Goal status: MET - 11/11/22  LONG TERM GOALS: Target date: 12/11/22  Pt will be able to complete LB dressing with supervision, to include donning socks and shoes. Baseline: currently max assist Goal status: IN PROGRESS  2.  Pt will be able to don bra at Mod I level. Baseline: unable to complete Goal status: IN PROGRESS  3.  Pt will be able to complete all aspects of toileting (transfer, clothing management, and hygiene) at Mod I level with LRAD. Baseline: Min-mod assist Goal status: IN PROGRESS  4.  Pt will be able to  maintain dynamic standing balance for 10 mins w/o LOB using DME and/or countertop support prn  to increase ease, safety, and independence with engagement in meal prep tasks. Baseline:  Goal status: IN PROGRESS  5.  Patient will demonstrate sufficient range of motion and activity tolerance to use BUE to shampoo her hair in the shower  Baseline: RUE 84* and LUE 95* shoulder ROM Goal status: IN PROGRESS   ASSESSMENT:  CLINICAL IMPRESSION: Pt continues to report feeling that her family and caregiver provide inconsistent styles of assistance.  OT provided written instructions to assist family in consistency and carryover of education and decrease burden of care.  Pt is demonstrating improvements towards goals, however continues to fluctuate greatly due to memory and motivation.   PERFORMANCE DEFICITS: in functional skills including ADLs, IADLs, coordination, ROM, strength, pain, flexibility, Gross motor control, balance, body mechanics, endurance, decreased knowledge of precautions, decreased knowledge of use of DME, and UE functional use, cognitive skills including emotional, energy/drive, problem solving, safety awareness, and sequencing, and psychosocial skills including coping strategies, environmental adaptation, habits, and routines and behaviors.   IMPAIRMENTS: are limiting patient from ADLs and IADLs.   CO-MORBIDITIES: may have co-morbidities  that affects occupational performance. Patient will benefit from skilled OT to address above impairments and improve overall function.  MODIFICATION OR ASSISTANCE TO COMPLETE EVALUATION: Min-Moderate modification of tasks or assist with assess necessary to complete an evaluation.  OT OCCUPATIONAL PROFILE AND HISTORY: Problem focused assessment: Including review of records relating to presenting problem.  CLINICAL DECISION MAKING: Moderate - several treatment options, min-mod task modification necessary  REHAB POTENTIAL: Good  EVALUATION  COMPLEXITY: Moderate    PLAN:  OT FREQUENCY: 2x/week  OT DURATION: 8 weeks  PLANNED INTERVENTIONS: self care/ADL training, therapeutic exercise, therapeutic activity, neuromuscular re-education, manual therapy, passive range of motion, balance training, functional mobility training, ultrasound, compression bandaging, moist heat, cryotherapy, cognitive remediation/compensation, psychosocial skills training, energy conservation, coping strategies training, and DME and/or AE instructions  RECOMMENDED OTHER SERVICES: NA  CONSULTED AND AGREED WITH PLAN OF CARE: Patient and family member/caregiver  PLAN FOR NEXT SESSION: Reiterate safety with mobility (use of RW!), problem solve donning bra, BUE ROM for reaching and self-care tasks, dynamic standing balance.   Simonne Come, OTR/L 11/16/2022, 3:37 PM

## 2022-11-16 NOTE — Therapy (Signed)
OUTPATIENT PHYSICAL THERAPY NEURO TREATMENT/10th Visit PROGRESS NOTE   Patient Name: Jessica [name] MRN: [id number] DOB:[birth year], 82 y.o., female Today's Date: 11/16/2022   PCP: [name], MD REFERRING PROVIDER: Izora Ribas, MD  Progress Note Reporting Period 10/13/2022 to 11/16/2022  See note below for Objective Data and Assessment of Progress/Goals.     END OF SESSION:  PT End of Session - 11/16/22 1359     Visit Number 10    Number of Visits 17    Date for PT Re-Evaluation 12/11/22    Authorization Type [employer] Medicare    PT Start Time 1450    PT Stop Time 1532    PT Time Calculation (min) 42 min    Equipment Utilized During Treatment Gait belt    Activity Tolerance Patient tolerated treatment well    Behavior During Therapy Flat affect                 Past Medical History:  Diagnosis Date   Allergy    Anemia    Anxiety    Asthma    in past, no inhalers now   Blood transfusion without reported diagnosis    Breast cancer ([place]) 01/2017   left breast    Breast cancer of upper-inner quadrant of left female breast ([place]) 03/11/2017   Cancer ([place]) 01/2017   left breast   Cataract    bilateral removed   Chronic kidney disease    kidney stones   Depression 08/31/2013   Dyslipidemia, goal LDL below 160 08/31/2013   Essential hypertension - well controlled 08/31/2013   Family history of breast cancer    Family history of melanoma    Family history of prostate cancer    GERD (gastroesophageal reflux disease)    Goals of care, counseling/discussion 03/11/2017   History of radiation therapy 10/13/17-11/11/17   left breast 40.05 Gy in 15 fractions, left breast boost 10 Gy in 5 fractions   Hypothyroidism    Ischemic stroke [facility])    May 2023;   Obesity (BMI 30-39.9) 08/31/2013   Personal history of chemotherapy 2018   Personal history of radiation therapy 2019   Thyroid disease    Vasovagal near-syncope -rare 08/31/2013   Past Surgical  History:  Procedure Laterality Date   ABDOMINAL HYSTERECTOMY     total   BREAST BIOPSY Left 02/24/2017    malignant   BREAST LUMPECTOMY Left 03/23/2017   broken fingers     CATARACT EXTRACTION Bilateral    COLONOSCOPY     COLONOSCOPY WITH PROPOFOL N/A 06/03/2022   Procedure: COLONOSCOPY WITH PROPOFOL;  Surgeon: [name], MD;  Location: [facility];  Service: Gastroenterology;  Laterality: N/A;   ESOPHAGOGASTRODUODENOSCOPY (EGD) WITH PROPOFOL N/A 05/29/2022   Procedure: ESOPHAGOGASTRODUODENOSCOPY (EGD) WITH PROPOFOL;  Surgeon: Jerene Bears, MD;  Location: [facility];  Service: Gastroenterology;  Laterality: N/A;   EXCISIONAL HEMORRHOIDECTOMY     IR FLUORO GUIDE PORT INSERTION RIGHT  08/04/2017   IR REMOVAL TUN ACCESS W/ PORT W/O FL MOD SED  08/04/2017   IR US GUIDE VASC ACCESS RIGHT  08/04/2017   IRRIGATION AND DEBRIDEMENT ABSCESS Left 05/18/2017   Procedure: IRRIGATION AND DEBRIDEMENT LEFT AXILLARY ABSCESS;  Surgeon: Michael Boston, MD;  Location: [facility];  Service: General;  Laterality: Left;   kidney stones lithotripsy     POLYPECTOMY  06/03/2022   Procedure: POLYPECTOMY;  Surgeon: [name], MD;  Location: [facility];  Service: Gastroenterology;;   [facility] PLACEMENT Right 03/23/2017  Procedure: INSERTION PORT-A-CATH;  Surgeon: Erroll Luna, MD;  Location: [facility];  Service: General;  Laterality: Right;   RADIOACTIVE SEED GUIDED PARTIAL MASTECTOMY WITH AXILLARY SENTINEL LYMPH NODE BIOPSY Left 03/23/2017   Procedure: LEFT BREAST RADIOACTIVE SEED GUIDED PARTIAL MASTECTOMY AND  SENTINEL LYMPH NODE MAPPING;  Surgeon: Erroll Luna, MD;  Location: [place];  Service: General;  Laterality: Left;   ROTATOR CUFF REPAIR     left    TONSILLECTOMY     WISDOM TOOTH EXTRACTION     Patient Active Problem List   Diagnosis Date Noted   Altered mental status 06/11/2022   Acute encephalopathy 06/10/2022   UTI (urinary tract infection)  06/10/2022   Constipation    History of stroke in adulthood 06/05/2022   Heme positive stool    Iron deficiency anemia due to chronic blood loss    Left thalamic infarction ([place]) 03/23/2022   Acquired hammer toe of right foot 08/19/2021   Adverse reaction to ACE-I (angiotensin-converting enzyme inhibitor) 08/19/2021   Allergic rhinitis 08/19/2021   Anxiety 08/19/2021   Asthma 08/19/2021   Cardiomegaly 08/19/2021   Dependence on other enabling machines and devices 08/19/2021   History of adenomatous polyp of colon 08/19/2021   History of pneumonia 08/19/2021   Internal hemorrhoid 08/19/2021   Iron deficiency anemia secondary to inadequate dietary iron intake 08/19/2021   Osteopenia 08/19/2021   Personal history of malignant neoplasm of breast 08/19/2021   Somnolence 08/19/2021   Vitamin D deficiency 08/19/2021   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   Insomnia 06/25/2019   Obstructive sleep apnea 06/13/2019   Genetic testing 06/29/2017   Family history of melanoma    Family history of prostate cancer    Family history of breast cancer    Angioedema 05/26/2017   Left axillary seroma status post incision and drainage 05/18/2017 05/18/2017   Cellulitis of left axilla 05/17/2017   GERD (gastroesophageal reflux disease) 05/16/2017   Acquired hypothyroidism 05/16/2017   Depression with anxiety 05/16/2017   Breast cancer of upper-inner quadrant of left female breast ([place]) 03/11/2017   Goals of care, counseling/discussion 03/11/2017   Obesity (BMI 30-39.9) 08/31/2013   Essential hypertension - well controlled 08/31/2013    Class: Diagnosis of   Depression 08/31/2013    Class: Diagnosis of   Vasovagal near-syncope -rare 08/31/2013    Class: History of   Dyslipidemia, goal LDL below 160 08/31/2013    ONSET DATE: 10/05/2022  REFERRING DIAG: I63.50 (ICD-10-CM) - Cerebrovascular accident (CVA) due to occlusion of cerebral artery ([place])   THERAPY DIAG:  Muscle weakness  (generalized)  Unsteadiness on feet  Other abnormalities of gait and mobility  Rationale for Evaluation and Treatment: Rehabilitation  SUBJECTIVE:  SUBJECTIVE STATEMENT: Worked on strategies with dressing from OT.   Pt accompanied by: family member, daughter, [name]  PERTINENT HISTORY: HTN, HLD, hypothyroidism, asthma, depression, anxiety, L breast cancer post-mastectomy 2018, obesity; 03/20/22 with CVA R sided weakness  PAIN:  Are you having pain? Yes: NPRS scale: 0/10 Pain location: shoulder Pain description: aching Aggravating factors: sleeping on it wrong Relieving factors: rubbing it *OT aware PRECAUTIONS: Fall  WEIGHT BEARING RESTRICTIONS: No  FALLS: Has patient fallen in last 6 months? Yes. Number of falls 1  LIVING ENVIRONMENT: Lives with: lives with their family and lives with their daughter Lives in: House/apartment Stairs: No Has following equipment at home: Environmental consultant - 2 wheeled, Environmental consultant - 4 wheeled, and shower chair  PLOF: Independent  PATIENT GOALS: To get back home, to get off the walker  OBJECTIVE:  Pt reports continued difficulty with getting up from arm chair at home, due to it being softer.  Feels she may fall.  TODAY'S TREATMENT: 11/16/2022 Activity Comments  Sit to stand from 20" mat surface x 8 reps Cues for forward scoot, foot placement, forward lean, brief push up with hands to upright stand; initial slowed pace, improves with repetition  Practiced additional 5 reps from 18" mat surface, then 5 reps with added cushion to simulate softer surface, like chair at home; cues for sequencing scooting forward, foot placement, hand placement, increased forward lean to stand Less Vcs with repetition  Gait with RW 40 ft, then 90 ft (used RW to simulate use of walker at home); then gait 50 ft  with rollator, supervision              PATIENT EDUCATION: Education details: Use of RW at all times in home due to fall risk; reviewed progress towards goals from last session Person educated: Patient and Child(ren) Education method: Explanation, Demonstration, and Verbal cues Education comprehension: verbalized understanding and returned demonstration   TREATMENT: 11/11/22 Activity Comments  STS x5 pushing off knees Required reminder to tuck feet under, lean nose over toes; occasional min A  5xSTS 29.78 sec with B armrests; cues to maintain pacing   TUG 46.49 sec with 4YH  Merrilee Jansky 41/56   Gait training with 4WW Cueing to try to maintain [name] speed and heel-toe pattern              HOME EXERCISE PROGRAM Last updated: 10/27/22 Access Code: [email or web address] URL: [email or web address] Date: 10/27/2022 Prepared by: [facility]  Exercises - Sit to Stand with Counter Support  - 1 x daily - 7 x weekly - 3 sets - 5 reps 1. scoot bottom to edge of seat 2. tuck feet underneath you 3. lean nose over toes - Heel Raises with Counter Support  - 1 x daily - 7 x weekly - 3 sets - 10 reps - Side to Side Weight Shift with Overhead Reach and Counter Support  - 1 x daily - 7 x weekly - 3 sets - 10 reps - Staggered Stance Forward Backward Weight Shift with Counter Support  - 1 x daily - 7 x weekly - 3 sets - 10 reps - Alternating Step Taps with Counter Support  - 1 x daily - 5 x weekly - 3 sets - 10 reps - Side Stepping with Counter Support  - 1 x daily - 5 x weekly - 1 sets - 3 reps   ------------------------------------------------------------------------------------------------- Objective measures below taken at time of eval:  DIAGNOSTIC FINDINGS: MRI 06/11/22-no changes; 03/21/22 MRI:  acute ischemia within the L caudate body; old R basal ganglia small vessel infarct  COGNITION: Overall cognitive status:  Dysarthria per notes; flat  affect   SENSATION: Light touch: WFL   POSTURE: rounded shoulders and forward head  LOWER EXTREMITY ROM:   WFL for BLEs  Active  Right Eval Left Eval  Hip flexion    Hip extension    Hip abduction    Hip adduction    Hip internal rotation    Hip external rotation    Knee flexion    Knee extension    Ankle dorsiflexion    Ankle plantarflexion    Ankle inversion    Ankle eversion     (Blank rows = not tested)  LOWER EXTREMITY MMT:    MMT Right Eval Left Eval  Hip flexion 3+ 3+  Hip extension    Hip abduction 3+ 3+  Hip adduction 3 3  Hip internal rotation    Hip external rotation    Knee flexion 4 3+  Knee extension 3+ 3+  Ankle dorsiflexion 3+ 3-  Ankle plantarflexion    Ankle inversion    Ankle eversion    (Blank rows = not tested)  BED MOBILITY:  Requires assist of family, daughter reports providing 50% assistance  TRANSFERS: Assistive device utilized:  UE support to stand at locked rollator   Sit to stand: SBA with UE support Stand to sit: CGA with UE support   GAIT: Gait pattern: step through pattern, decreased step length- Right, decreased step length- Left, and poor foot clearance- Right Distance walked: 50 ft Assistive device utilized: Walker - 4 wheeled Level of assistance: CGA Comments: 60 M walk:  20.34 sec (1.61 ft/sec)  FUNCTIONAL TESTS:  5 times sit to stand: 41.35 sec with UE support Timed up and go (TUG): 45.90 sec with 4-wheeled RW Berg Balance Scale: 24/56 10 M walk:  1.61 ft/sec  with RW  PATIENT SURVEYS:  FOTO intake score 51; predicted score at discharge 68      PATIENT EDUCATION: Education details: Eval results, POC Person educated: Patient and Child(ren) Education method: Explanation Education comprehension: verbalized understanding    GOALS: Goals reviewed with patient? Yes  SHORT TERM GOALS: Target date: 11/13/2022  Pt will be supervision with HEP for improved strength, balance, gait. Baseline: Goal status:  MET 11/11/22  2.  Pt will improve 5x sit<>stand to less than or equal to 30 sec to demonstrate improved functional strength and transfer efficiency.  Baseline: 41.35 sec; 29.78 sec with B armrests Goal status: MET 11/11/22  3.  Pt will improve Berg score to at least 30/56 to decrease fall risk. Baseline: 24/56; 41 11/11/22 Goal status: MET 11/11/22  4.  Pt will improve TUG score to less than or equal to 35 sec for decreased fall risk. Baseline: 45.9 sec; 46.49 sec with 4WW 11/11/22 Goal status: NOT MET 11/11/22  LONG TERM GOALS: Target date: 12/11/2022  Pt will be supervision with HEP for improved strength, balance, gait. Baseline:  Goal status: IN PROGRESS  2.  Pt will improve 5x sit<>stand to less than or equal to 20 sec to demonstrate improved functional strength and transfer efficiency.  Baseline:  Goal status: IN PROGRESS  3.  Pt will improve Berg score to at least 40/56 (*UPDATED GOAL TO 46/56) to decrease fall risk. Baseline: 41/56 11/11/22 Goal status: MET 11/11/22>REVISED, 11/16/2022  4.  Pt will improve TUG score to less than or equal to 20 sec for decreased fall risk. Baseline: 46.49  sec with 4WW 11/11/22 Goal status: IN PROGRESS  5.  Pt will improve gait velocity to at least 2 ft/sec for improved gait efficiency and safety. Baseline: 1.61 ft/sec Goal status: IN PROGRESS  ASSESSMENT:  CLINICAL IMPRESSION: 10th visit PN:  Subjectively, daughter reports pt has been trying to walk at home without assistive device.  Pt reports fear of falling at times with sit to stand transfers.  Reiterated to patient fall risk based on scores from last visit.  Objective measures:  Berg:  40/56 (improved from 24/56), TUG 46.49 sec (compared to 45.9 sec at eval); FTSTS:  29.78 sec, improved from 41.35 sec).  Pt has met 3 of 4 STGs and has met LTG 3 for Berg (revised to reflect pt's progress).  While she is improving towards goals, she remains at increased fall risk and would continue to benefit  family supervision and cues to assist with transfers and gait.  Pt is progressing towards LTGs and will benefit from additional skilled PT services to progress overall strength, functional mobility, and balance to decrease fall risk.   OBJECTIVE IMPAIRMENTS: Abnormal gait, decreased balance, decreased mobility, difficulty walking, decreased strength, and postural dysfunction.   ACTIVITY LIMITATIONS: lifting, bending, standing, squatting, transfers, bed mobility, toileting, hygiene/grooming, and locomotion level  PARTICIPATION LIMITATIONS: meal prep, cleaning, laundry, driving, shopping, and community activity  PERSONAL FACTORS: Time since onset of injury/illness/exacerbation and 3+ comorbidities: see above for PMH  are also affecting patient's functional outcome.   REHAB POTENTIAL: Good  CLINICAL DECISION MAKING: Evolving/moderate complexity  EVALUATION COMPLEXITY: Moderate  PLAN:  PT FREQUENCY: 2x/week  PT DURATION: 8 weeks plus 1x/wk (during eval week)-9 week POC  PLANNED INTERVENTIONS: Therapeutic exercises, Therapeutic activity, Neuromuscular re-education, Balance training, Gait training, Patient/Family education, and Self Care  PLAN FOR NEXT SESSION:Ask about transfers and bed mobility.  Update HEP as needed for strength, balance.   Additional work on bed mobility, transfers, standing strength and balance towards LTGs.   Mady Haagensen, PT 11/16/22 4:53 PM Phone: [phone] Fax: [phone]   [facility] at [facility], [place], [place] [address] Phone # [phone] Fax # [phone]

## 2022-11-16 NOTE — Therapy (Signed)
OUTPATIENT SPEECH LANGUAGE PATHOLOGY TREATMENT   Patient Name: Jessica [name] MRN: [id number] DOB:[birth year], 82 y.o., female Today's Date: 11/16/2022  PCP: [name], MD  REFERRING PROVIDER: Heywood Iles, MD  END OF SESSION:  End of Session - 11/16/22 1406     Visit Number 6    Number of Visits 25    Date for SLP Re-Evaluation 01/11/23    SLP Start Time 1320    SLP Stop Time  1400    SLP Time Calculation (min) 40 min    Activity Tolerance Patient tolerated treatment well                  Past Medical History:  Diagnosis Date   Allergy    Anemia    Anxiety    Asthma    in past, no inhalers now   Blood transfusion without reported diagnosis    Breast cancer ([place]) 01/2017   left breast    Breast cancer of upper-inner quadrant of left female breast ([place]) 03/11/2017   Cancer ([place]) 01/2017   left breast   Cataract    bilateral removed   Chronic kidney disease    kidney stones   Depression 08/31/2013   Dyslipidemia, goal LDL below 160 08/31/2013   Essential hypertension - well controlled 08/31/2013   Family history of breast cancer    Family history of melanoma    Family history of prostate cancer    GERD (gastroesophageal reflux disease)    Goals of care, counseling/discussion 03/11/2017   History of radiation therapy 10/13/17-11/11/17   left breast 40.05 Gy in 15 fractions, left breast boost 10 Gy in 5 fractions   Hypothyroidism    Ischemic stroke [facility])    May 2023;   Obesity (BMI 30-39.9) 08/31/2013   Personal history of chemotherapy 2018   Personal history of radiation therapy 2019   Thyroid disease    Vasovagal near-syncope -rare 08/31/2013   Past Surgical History:  Procedure Laterality Date   ABDOMINAL HYSTERECTOMY     total   BREAST BIOPSY Left 02/24/2017    malignant   BREAST LUMPECTOMY Left 03/23/2017   broken fingers     CATARACT EXTRACTION Bilateral    COLONOSCOPY     COLONOSCOPY WITH PROPOFOL N/A 06/03/2022   Procedure:  COLONOSCOPY WITH PROPOFOL;  Surgeon: [name], MD;  Location: [facility];  Service: Gastroenterology;  Laterality: N/A;   ESOPHAGOGASTRODUODENOSCOPY (EGD) WITH PROPOFOL N/A 05/29/2022   Procedure: ESOPHAGOGASTRODUODENOSCOPY (EGD) WITH PROPOFOL;  Surgeon: Jerene Bears, MD;  Location: [facility];  Service: Gastroenterology;  Laterality: N/A;   EXCISIONAL HEMORRHOIDECTOMY     IR FLUORO GUIDE PORT INSERTION RIGHT  08/04/2017   IR REMOVAL TUN ACCESS W/ PORT W/O FL MOD SED  08/04/2017   IR US GUIDE VASC ACCESS RIGHT  08/04/2017   IRRIGATION AND DEBRIDEMENT ABSCESS Left 05/18/2017   Procedure: IRRIGATION AND DEBRIDEMENT LEFT AXILLARY ABSCESS;  Surgeon: Michael Boston, MD;  Location: [facility];  Service: General;  Laterality: Left;   kidney stones lithotripsy     POLYPECTOMY  06/03/2022   Procedure: POLYPECTOMY;  Surgeon: [name], MD;  Location: [facility];  Service: Gastroenterology;;   PORTACATH PLACEMENT Right 03/23/2017   Procedure: INSERTION PORT-A-CATH;  Surgeon: Erroll Luna, MD;  Location: [place];  Service: General;  Laterality: Right;   RADIOACTIVE SEED GUIDED PARTIAL MASTECTOMY WITH AXILLARY SENTINEL LYMPH NODE BIOPSY Left 03/23/2017   Procedure: LEFT BREAST RADIOACTIVE SEED GUIDED PARTIAL MASTECTOMY AND  SENTINEL  LYMPH NODE MAPPING;  Surgeon: Erroll Luna, MD;  Location: [place];  Service: General;  Laterality: Left;   ROTATOR CUFF REPAIR     left    TONSILLECTOMY     WISDOM TOOTH EXTRACTION     Patient Active Problem List   Diagnosis Date Noted   Altered mental status 06/11/2022   Acute encephalopathy 06/10/2022   UTI (urinary tract infection) 06/10/2022   Constipation    History of stroke in adulthood 06/05/2022   Heme positive stool    Iron deficiency anemia due to chronic blood loss    Left thalamic infarction ([place]) 03/23/2022   Acquired hammer toe of right foot 08/19/2021   Adverse reaction to ACE-I  (angiotensin-converting enzyme inhibitor) 08/19/2021   Allergic rhinitis 08/19/2021   Anxiety 08/19/2021   Asthma 08/19/2021   Cardiomegaly 08/19/2021   Dependence on other enabling machines and devices 08/19/2021   History of adenomatous polyp of colon 08/19/2021   History of pneumonia 08/19/2021   Internal hemorrhoid 08/19/2021   Iron deficiency anemia secondary to inadequate dietary iron intake 08/19/2021   Osteopenia 08/19/2021   Personal history of malignant neoplasm of breast 08/19/2021   Somnolence 08/19/2021   Vitamin D deficiency 08/19/2021   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   Insomnia 06/25/2019   Obstructive sleep apnea 06/13/2019   Genetic testing 06/29/2017   Family history of melanoma    Family history of prostate cancer    Family history of breast cancer    Angioedema 05/26/2017   Left axillary seroma status post incision and drainage 05/18/2017 05/18/2017   Cellulitis of left axilla 05/17/2017   GERD (gastroesophageal reflux disease) 05/16/2017   Acquired hypothyroidism 05/16/2017   Depression with anxiety 05/16/2017   Breast cancer of upper-inner quadrant of left female breast ([place]) 03/11/2017   Goals of care, counseling/discussion 03/11/2017   Obesity (BMI 30-39.9) 08/31/2013   Essential hypertension - well controlled 08/31/2013    Class: Diagnosis of   Depression 08/31/2013    Class: Diagnosis of   Vasovagal near-syncope -rare 08/31/2013    Class: History of   Dyslipidemia, goal LDL below 160 08/31/2013    ONSET DATE: May 2023   REFERRING DIAG: I63.50 (ICD-10-CM) - Cerebrovascular accident (CVA) due to occlusion of cerebral artery ([place])  THERAPY DIAG:  Cognitive communication deficit  Dysarthria and anarthria  Rationale for Evaluation and Treatment: Rehabilitation  SUBJECTIVE:   SUBJECTIVE STATEMENT: "My grandson's birthday is Friday."  Pt accompanied by:  self    PERTINENT HISTORY:  Presented 03/20/2022 with right-sided weakness slurred  speech and altered mental status with right facial droop of acute onset.  CT/MRI showed small focus of acute ischemia within the left caudate body.  No hemorrhage or mass effect.  Old right basal ganglia small vessel infarct and findings of chronic microvascular disease.  Patient did not receive tPA.  CT angiogram head and neck no emergent large vessel occlusion.  Echocardiogram ejection fraction of 60 to 65% no wall motion abnormalities.  Pt was [facility] to [facility] on 05/28/2022 with suspected acute lower gastrointestinal bleed complicated by acute blood loss anemia after presenting from home to [facility] ED complaining of low hemoglobin per outpatient labs.  In September 2023 Tecia was brought by her daughters after a fall at home. She was leaning over to pick up a piece of paper and lost her balance hitting her nose and L cheek on the ground. Unsure if she also hit her head. Did not have LOC. Not on  a blood thinner. No other reports of injuries.  She rec'd HHST, which, according to daughter focused mostly on swallowing.  PAIN:  Are you having pain? No  PATIENT GOALS: Improve memory  OBJECTIVE:   DIAGNOSTIC FINDINGS:  07/07/22 - IMPRESSION: 1. No acute intracranial abnormality. No skull fracture. 2. Stable atrophy, chronic small vessel ischemia, and remote bilateral basal gangliar lacunar infarcts.  PATIENT REPORTED OUTCOME MEASURES (PROM): Cognitive Function:   and Communication Participation Item Bank: to be provided.   TODAY'S TREATMENT:                                                                                                                                         DATE:  11/16/22: When SLP asked about Arr[place], Serena told SLP she "couldn't come due to company from [place]." Pt came back to [place] room without [name]. She directed SLP to take out her planner when asked where her planner was. She forgot her reading glasses so SLP put reminder in the front of her planner. SLP  reviewed memory strategies and highlighted to pt how she was using some of these strategies. SLP assisted pt with placing date of her grandson's birthday and date of his party on her calendar. She req'd mod A for today's date although all dates were crossed off until today. She told SLP of her grandson's birthday on Friday however did not recall date of his party - [name] corrected. [name] is cueing pt to ask her for her meds each meal - [place] demo'd fristration with this but was reminded this is a stepping stone to her walking over safely to get her own meds at some time in the future.   11/11/22: SLP asked pt who she wanted to attend speech therapy with her. Pt did not indicate [name] nor [name] so SLP took pt by herself. Pt was most talkative she has been today with SLP in [place] room. Pt conveyed feelings of hopelessness and despair, but no mention of harming herself. SLP believes pt is having trouble dealing with feelings about the decreasing of her independence and changes due to CVA. SLP used skilled observation to ascertain pt's aphasia is not as severe as originally thought - pt was not talkative up to now likely due to psychological factors. No difficulty finding words or generating spoken language today. Pt stated the calendar is helpful in her memory book. With initial cues to find today's date she told SLP her schedule today, what she had remaining, her next therapy date and time for ST. She would like to have more things on her calendar and SLP suggested ANYTHING that includes pt should be added - SLP shared this with aide and daughter at end of session. Pt talked about wanting to do more but thinking her daughters were too busy or would not allow it, or wouldn't allow the extent of the activity premorbidly. SLP assisted pt  in some anticipatory awareness and after more detailed explanation to rationale pt stated she understood.   03/04/96: SLP ascertained table tent for "medicine" still not being used. [name]  stated they will begin to do this today. SLP cued pt about her memory notebook, which pt showed SLP correct tab to find PT exercises, OT exercises and ST handout - all with extra time. SLP, throughout session, provided education for Kim for pt to set timers, keep a log of daily activities, cross off calendar days, and to request her medication after her meals. SLP educated pt and [name] about memory strategies.   11/04/22: Pt did not have opportunity to use table tent as [name] had family emergency and she could not communicate info to [place] to put into practice. SLP encouraged this to occur soon. [name] brought a 3 ring binder with notebook paper, a loose calendar with a pen, and all therapy papers in a pocket. SLP had pt pt write tabs ST PT and OT - pt wrote 2/3 legibly. She organized with mod A (put OT HEP with her PT HEP), but separated ST papers correctly. SLP provided a calendar of January 2024 and suggested that [name] write the therapies in the dates and times to which Charmion tells her. SLP highlighted that we want Chanler to do as much as she can safely do - due to entries on the loose calendar being ~90% illegible.  SLP strongly suggested [name] attend a therapy session for carryover of OT and PT exercises. SLP req'd to ask Lucielle to repeat 25% of her utterances. SLP not convinced pt desires ST after today's session. SLP to cont to develop rapport with pt and highlight benefits of ST to pt's rehab journey. Memory strategies to be discussed next session as SLP targeted memory notebook/system today as daughter indicated it was brought to ST.  10/16/22: Will discuss memory compensations further next session. Discussed pt's medication and timing with pt today. Deanndra told SLP incorrect schedule apart from mealtimes and bedtime. [name] assisted. SLP suggested table tent for med reminder to tell Judeen Hammans it was time for medication administration. Pt shared she does not like taste of Potassium and feeds it to the dog.  SLP assisted problem solving with pt and daughter about how to achieve goal of taking Potassium and also having alternative to taste (brand with heavier coating). SLP suggested calendar for pt to view appointments - dtr to buy calendar and put appointments on it.  10/13/22: SLP discussed findings so far in evaluation. SLP will need to perform further assessment once rapport is developed with pt as she did not freely offer much information today SLP suspects due to reason that she does not desire tx for dysarthria. During evaluation pt stated she has trouble with memory and so SLP assessed memory. SLP hopes to eventually, as pt warms to him and ST in general, target pt's soft conversational speech.  PATIENT EDUCATION: Education details: as above in "today's treatment" Person educated: Patient and Child(ren) Education method: Explanation Education comprehension: verbalized understanding and needs further education   GOALS: Goals reviewed with patient? Yes  SHORT TERM GOALS: Target date: 11-20-22  Pt will bring memory compensation device/system to 60% of ST sessions Baseline: (4/4) Goal status: Ongoing  2.  Pt will indicate knowledge she can use memory system for functional tasks (I.e., will open/acess system when asked her therapy schedule) x 3 sessions Baseline: 11/11/22, 11/16/22 Goal status: Ongoing  3.  Undergo more in depth cognitive communication testing in first  4 sessions Baseline:  Goal status: Ongoing  4.  Baila will indicate she has told daughter/caregiver to administer her meds for 7 consecutive days Baseline:  Goal status: Ongoing   LONG TERM GOALS: Target date: 01/10/23  Isabele will track appointments and other social events using compensations in 5 sessions Baseline:  Goal status: Ongoing  2.  Majorie will indicate she has administered her meds correctly with SBA for 7 consecutive days Baseline:  Goal status: Ongoing  3.  Ahmiyah will indicate she feels more  confident talking than initial sessions by improved PRM score/s Baseline:  Goal status: Ongoing   ASSESSMENT:  CLINICAL IMPRESSION: Patient is a 82 y.o. female who was seen today for treatment of memory in language. Discussion on memory compensation system continued today with pt's memory book. Calendar tasks also targeted. Voice volume was mid 60s dB.    OBJECTIVE IMPAIRMENTS: include attention, memory, aphasia, and dysarthria. These impairments are limiting patient from managing medications, managing appointments, managing finances, household responsibilities, ADLs/IADLs, and effectively communicating at home and in community. Factors affecting potential to achieve goals and functional outcome are ability to learn/carryover information and cooperation/participation level.. Patient will benefit from skilled SLP services to address above impairments and improve overall function.  REHAB POTENTIAL: Fair given pt's motivation/response to SLP about desire for ST today during eval  PLAN:  SLP FREQUENCY: 2x/week  SLP DURATION: 12 weeks  PLANNED INTERVENTIONS: Language facilitation, Environmental controls, Cueing hierachy, Cognitive reorganization, Internal/external aids, Oral motor exercises, Functional tasks, Multimodal communication approach, SLP instruction and feedback, Compensatory strategies, and Patient/family education    [facility], [place] 11/16/2022, 2:06 PM

## 2022-11-16 NOTE — Patient Instructions (Signed)
Reminders If Mariska is having difficulty with an article of clothing, before physically assisting her, provide verbal cues to help her attempt again or another way.  Then physically assist if still needed. With transfers: if pt attempting to stand in an unsafe manner, before physically assisting her, ask her if she is able to recognize anything that may be of concern.  If she notices, have her correct issue, otherwise then cue her or correct it yourself.

## 2022-11-16 NOTE — Patient Instructions (Addendum)
Sit to Stand Transfers:  Scoot out to the edge of the chair Place your feet flat on the floor, shoulder width apart.  Make sure your feet are tucked just under your knees. Lean forward (nose over toes) with momentum, and stand up tall with your best posture.  If you need to use your arms, use them as a quick boost up to stand. If you are in a low or soft chair, you can lean back and then forward up to stand, in order to get more momentum. Once you are standing, make sure you are looking ahead and standing tall.  To sit down:  Back up until you feel the chair behind your legs. Bend at you hips, reaching  Back for you chair, if needed, then slowly squat to sit down on your chair.    *BASED ON YOUR FINDINGS FROM GOAL CHECK LAST WEEK:  Berg score 41/56 (improved from 24/56); Scores <45/56 indicate increased fall risk 5x sit<>stand :  29.78 sec, improved from 41.35 sec; Scores > 15 sec indicate increased fall risk *FOR SAFETY at home, PLEASE USE ROLLING WALKER at all times

## 2022-11-17 NOTE — Therapy (Addendum)
OUTPATIENT PHYSICAL THERAPY NEURO TREATMENT   Patient Name: Jessica [name] MRN: [id number] DOB:12/28/1940, 82 y.o., female Today's Date: 11/18/2022   PCP: [name], MD REFERRING PROVIDER: Izora Ribas, MD     END OF SESSION:  PT End of Session - 11/18/22 1459     Visit Number 11    Number of Visits 17    Date for PT Re-Evaluation 12/11/22    Authorization Type [employer] Medicare    PT Start Time 1407    PT Stop Time 1450    PT Time Calculation (min) 43 min    Equipment Utilized During Treatment Gait belt    Activity Tolerance Patient tolerated treatment well    Behavior During Therapy Flat affect                 Past Medical History:  Diagnosis Date   Allergy    Anemia    Anxiety    Asthma    in past, no inhalers now   Blood transfusion without reported diagnosis    Breast cancer ([place]) 01/2017   left breast    Breast cancer of upper-inner quadrant of left female breast ([place]) 03/11/2017   Cancer ([place]) 01/2017   left breast   Cataract    bilateral removed   Chronic kidney disease    kidney stones   Depression 08/31/2013   Dyslipidemia, goal LDL below 160 08/31/2013   Essential hypertension - well controlled 08/31/2013   Family history of breast cancer    Family history of melanoma    Family history of prostate cancer    GERD (gastroesophageal reflux disease)    Goals of care, counseling/discussion 03/11/2017   History of radiation therapy 10/13/17-11/11/17   left breast 40.05 Gy in 15 fractions, left breast boost 10 Gy in 5 fractions   Hypothyroidism    Ischemic stroke [facility])    May 2023;   Obesity (BMI 30-39.9) 08/31/2013   Personal history of chemotherapy 2018   Personal history of radiation therapy 2019   Thyroid disease    Vasovagal near-syncope -rare 08/31/2013   Past Surgical History:  Procedure Laterality Date   ABDOMINAL HYSTERECTOMY     total   BREAST BIOPSY Left 02/24/2017    malignant   BREAST LUMPECTOMY Left 03/23/2017    broken fingers     CATARACT EXTRACTION Bilateral    COLONOSCOPY     COLONOSCOPY WITH PROPOFOL N/A 06/03/2022   Procedure: COLONOSCOPY WITH PROPOFOL;  Surgeon: [name], MD;  Location: [facility];  Service: Gastroenterology;  Laterality: N/A;   ESOPHAGOGASTRODUODENOSCOPY (EGD) WITH PROPOFOL N/A 05/29/2022   Procedure: ESOPHAGOGASTRODUODENOSCOPY (EGD) WITH PROPOFOL;  Surgeon: Jerene Bears, MD;  Location: [facility];  Service: Gastroenterology;  Laterality: N/A;   EXCISIONAL HEMORRHOIDECTOMY     IR FLUORO GUIDE PORT INSERTION RIGHT  08/04/2017   IR REMOVAL TUN ACCESS W/ PORT W/O FL MOD SED  08/04/2017   IR US GUIDE VASC ACCESS RIGHT  08/04/2017   IRRIGATION AND DEBRIDEMENT ABSCESS Left 05/18/2017   Procedure: IRRIGATION AND DEBRIDEMENT LEFT AXILLARY ABSCESS;  Surgeon: Michael Boston, MD;  Location: [facility];  Service: General;  Laterality: Left;   kidney stones lithotripsy     POLYPECTOMY  06/03/2022   Procedure: POLYPECTOMY;  Surgeon: [name], MD;  Location: [facility];  Service: Gastroenterology;;   Sol Passer PLACEMENT Right 03/23/2017   Procedure: INSERTION PORT-A-CATH;  Surgeon: Erroll Luna, MD;  Location: [facility];  Service: General;  Laterality: Right;  RADIOACTIVE SEED GUIDED PARTIAL MASTECTOMY WITH AXILLARY SENTINEL LYMPH NODE BIOPSY Left 03/23/2017   Procedure: LEFT BREAST RADIOACTIVE SEED GUIDED PARTIAL MASTECTOMY AND  SENTINEL LYMPH NODE MAPPING;  Surgeon: Erroll Luna, MD;  Location: [facility];  Service: General;  Laterality: Left;   ROTATOR CUFF REPAIR     left    TONSILLECTOMY     WISDOM TOOTH EXTRACTION     Patient Active Problem List   Diagnosis Date Noted   Altered mental status 06/11/2022   Acute encephalopathy 06/10/2022   UTI (urinary tract infection) 06/10/2022   Constipation    History of stroke in adulthood 06/05/2022   Heme positive stool    Iron deficiency anemia due to chronic blood loss    Left thalamic  infarction ([place]) 03/23/2022   Acquired hammer toe of right foot 08/19/2021   Adverse reaction to ACE-I (angiotensin-converting enzyme inhibitor) 08/19/2021   Allergic rhinitis 08/19/2021   Anxiety 08/19/2021   Asthma 08/19/2021   Cardiomegaly 08/19/2021   Dependence on other enabling machines and devices 08/19/2021   History of adenomatous polyp of colon 08/19/2021   History of pneumonia 08/19/2021   Internal hemorrhoid 08/19/2021   Iron deficiency anemia secondary to inadequate dietary iron intake 08/19/2021   Osteopenia 08/19/2021   Personal history of malignant neoplasm of breast 08/19/2021   Somnolence 08/19/2021   Vitamin D deficiency 08/19/2021   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   Insomnia 06/25/2019   Obstructive sleep apnea 06/13/2019   Genetic testing 06/29/2017   Family history of melanoma    Family history of prostate cancer    Family history of breast cancer    Angioedema 05/26/2017   Left axillary seroma status post incision and drainage 05/18/2017 05/18/2017   Cellulitis of left axilla 05/17/2017   GERD (gastroesophageal reflux disease) 05/16/2017   Acquired hypothyroidism 05/16/2017   Depression with anxiety 05/16/2017   Breast cancer of upper-inner quadrant of left female breast ([place]) 03/11/2017   Goals of care, counseling/discussion 03/11/2017   Obesity (BMI 30-39.9) 08/31/2013   Essential hypertension - well controlled 08/31/2013    Class: Diagnosis of   Depression 08/31/2013    Class: Diagnosis of   Vasovagal near-syncope -rare 08/31/2013    Class: History of   Dyslipidemia, goal LDL below 160 08/31/2013    ONSET DATE: 10/05/2022  REFERRING DIAG: I63.50 (ICD-10-CM) - Cerebrovascular accident (CVA) due to occlusion of cerebral artery ([place])   THERAPY DIAG:  Muscle weakness (generalized)  Unsteadiness on feet  Other abnormalities of gait and mobility  Rationale for Evaluation and Treatment: Rehabilitation  SUBJECTIVE:  SUBJECTIVE STATEMENT: Has been using RW at home. Still having [name] help get out of bed.   Pt accompanied by: family member, daughter, [name]  PERTINENT HISTORY: HTN, HLD, hypothyroidism, asthma, depression, anxiety, L breast cancer post-mastectomy 2018, obesity; 03/20/22 with CVA R sided weakness  PAIN:  Are you having pain? Yes: NPRS scale: 0/10 Pain location: shoulder Pain description: aching Aggravating factors: sleeping on it wrong Relieving factors: rubbing it *OT aware PRECAUTIONS: Fall  WEIGHT BEARING RESTRICTIONS: No  FALLS: Has patient fallen in last 6 months? Yes. Number of falls 1  LIVING ENVIRONMENT: Lives with: lives with their family and lives with their daughter Lives in: House/apartment Stairs: No Has following equipment at home: Environmental consultant - 2 wheeled, Environmental consultant - 4 wheeled, and shower chair  PLOF: Independent  PATIENT GOALS: To get back home, to get off the walker  OBJECTIVE:     TODAY'S TREATMENT: 11/18/22 Activity Comments  Nustep L3 x 67 min Les only  Cueing to maintain pacing (at least 30 SPM)  [place] [employer] for rhythmic breathing  Deadbug 5x each Verbal/manula cueing for proper sequencing  LTR To address d/t LBP/hip pain; to tolerance   Sit<>supine  Verbal cueing to use L elbow to prop up to get up, cues to stack ankles to get onto mat; cues to reach across the R hand to roll  alt toe tap on step 1 UE support on II bar, then with light HHA instead  Sidestepping over hurdle  1 UE support; cueing for proper foot placement and increased step length  hip ABD 10x   Good amplitude which diminished with each rep              PATIENT EDUCATION: Education details: discussion with daughter about her request to work on car transfers into a tall vehicle  Person educated: Patient Education method:  Explanation Education comprehension: verbalized understanding   HOME EXERCISE PROGRAM Last updated: 10/27/22 Access Code: [email or web address] URL: [email or web address] Date: 10/27/2022 Prepared by: [facility]  Exercises - Sit to Stand with Counter Support  - 1 x daily - 7 x weekly - 3 sets - 5 reps 1. scoot bottom to edge of seat 2. tuck feet underneath you 3. lean nose over toes - Heel Raises with Counter Support  - 1 x daily - 7 x weekly - 3 sets - 10 reps - Side to Side Weight Shift with Overhead Reach and Counter Support  - 1 x daily - 7 x weekly - 3 sets - 10 reps - Staggered Stance Forward Backward Weight Shift with Counter Support  - 1 x daily - 7 x weekly - 3 sets - 10 reps - Alternating Step Taps with Counter Support  - 1 x daily - 5 x weekly - 3 sets - 10 reps - Side Stepping with Counter Support  - 1 x daily - 5 x weekly - 1 sets - 3 reps   ------------------------------------------------------------------------------------------------- Objective measures below taken at time of eval:  DIAGNOSTIC FINDINGS: MRI 06/11/22-no changes; 03/21/22 MRI:  acute ischemia within the L caudate body; old R basal ganglia small vessel infarct  COGNITION: Overall cognitive status:  Dysarthria per notes; flat affect   SENSATION: Light touch: WFL   POSTURE: rounded shoulders and forward head  LOWER EXTREMITY ROM:   WFL for BLEs  Active  Right Eval Left Eval  Hip flexion    Hip extension    Hip abduction    Hip adduction  Hip internal rotation    Hip external rotation    Knee flexion    Knee extension    Ankle dorsiflexion    Ankle plantarflexion    Ankle inversion    Ankle eversion     (Blank rows = not tested)  LOWER EXTREMITY MMT:    MMT Right Eval Left Eval  Hip flexion 3+ 3+  Hip extension    Hip abduction 3+ 3+  Hip adduction 3 3  Hip internal rotation    Hip external rotation    Knee flexion 4 3+  Knee extension  3+ 3+  Ankle dorsiflexion 3+ 3-  Ankle plantarflexion    Ankle inversion    Ankle eversion    (Blank rows = not tested)  BED MOBILITY:  Requires assist of family, daughter reports providing 50% assistance  TRANSFERS: Assistive device utilized:  UE support to stand at locked rollator   Sit to stand: SBA with UE support Stand to sit: CGA with UE support   GAIT: Gait pattern: step through pattern, decreased step length- Right, decreased step length- Left, and poor foot clearance- Right Distance walked: 50 ft Assistive device utilized: Walker - 4 wheeled Level of assistance: CGA Comments: 10 M walk:  20.34 sec (1.61 ft/sec)  FUNCTIONAL TESTS:  5 times sit to stand: 41.35 sec with UE support Timed up and go (TUG): 45.90 sec with 4-wheeled RW Berg Balance Scale: 24/56 10 M walk:  1.61 ft/sec  with RW  PATIENT SURVEYS:  FOTO intake score 51; predicted score at discharge 53      PATIENT EDUCATION: Education details: Eval results, POC Person educated: Patient and Child(ren) Education method: Explanation Education comprehension: verbalized understanding    GOALS: Goals reviewed with patient? Yes  SHORT TERM GOALS: Target date: 11/13/2022  Pt will be supervision with HEP for improved strength, balance, gait. Baseline: Goal status: MET 11/11/22  2.  Pt will improve 5x sit<>stand to less than or equal to 30 sec to demonstrate improved functional strength and transfer efficiency.  Baseline: 41.35 sec; 29.78 sec with B armrests Goal status: MET 11/11/22  3.  Pt will improve Berg score to at least 30/56 to decrease fall risk. Baseline: 24/56; 41 11/11/22 Goal status: MET 11/11/22  4.  Pt will improve TUG score to less than or equal to 35 sec for decreased fall risk. Baseline: 45.9 sec; 46.49 sec with 4WW 11/11/22 Goal status: NOT MET 11/11/22  LONG TERM GOALS: Target date: 12/11/2022  Pt will be supervision with HEP for improved strength, balance, gait. Baseline:  Goal  status: IN PROGRESS  2.  Pt will improve 5x sit<>stand to less than or equal to 20 sec to demonstrate improved functional strength and transfer efficiency.  Baseline:  Goal status: IN PROGRESS  3.  Pt will improve Berg score to at least 40/56 (*UPDATED GOAL TO 46/56) to decrease fall risk. Baseline: 41/56 11/11/22 Goal status: MET 11/11/22>REVISED, 11/16/2022  4.  Pt will improve TUG score to less than or equal to 20 sec for decreased fall risk. Baseline: 46.49 sec with 4WW 11/11/22 Goal status: IN PROGRESS  5.  Pt will improve gait velocity to at least 2 ft/sec for improved gait efficiency and safety. Baseline: 1.61 ft/sec Goal status: IN PROGRESS  ASSESSMENT:  CLINICAL IMPRESSION: Patient arrived to session without new complaints today. Daughter requests to work on car transfers into a taller vehicle in the future as patient is only comfortable transferring into one vehicle at this time (did not drive  the taller car here today). Reviewed bed mobility which patient required verbal cues for, otherwise able to perform well physically. Encouraged less reliance on caregivers for this task. Balance activities today required only 1 UE support. However, patient still does require frequent cues for safely when using walker. No complaints at end of session.   OBJECTIVE IMPAIRMENTS: Abnormal gait, decreased balance, decreased mobility, difficulty walking, decreased strength, and postural dysfunction.   ACTIVITY LIMITATIONS: lifting, bending, standing, squatting, transfers, bed mobility, toileting, hygiene/grooming, and locomotion level  PARTICIPATION LIMITATIONS: meal prep, cleaning, laundry, driving, shopping, and community activity  PERSONAL FACTORS: Time since onset of injury/illness/exacerbation and 3+ comorbidities: see above for PMH  are also affecting patient's functional outcome.   REHAB POTENTIAL: Good  CLINICAL DECISION MAKING: Evolving/moderate complexity  EVALUATION COMPLEXITY:  Moderate  PLAN:  PT FREQUENCY: 2x/week  PT DURATION: 8 weeks plus 1x/wk (during eval week)-9 week POC  PLANNED INTERVENTIONS: Therapeutic exercises, Therapeutic activity, Neuromuscular re-education, Balance training, Gait training, Patient/Family education, and Self Care  PLAN FOR NEXT SESSION:trial car transfers into family's taller car; Update HEP as needed for strength, balance.   Additional work on bed mobility, transfers, standing strength and balance towards LTGs.   Anette Guarneri, PT, DPT 11/18/22 3:03 PM  [facility] [address] [place], [address] [place], [place] [address] Phone # [phone] Fax # [phone]    PHYSICAL THERAPY DISCHARGE SUMMARY  Visits from Start of Care: 11  Current functional level related to goals / functional outcomes: Unable to assess; patient did not return   Remaining deficits: Unable to assess   Education / Equipment: HEP  Plan: Patient agrees to discharge.  Patient goals were not met. Patient is being discharged due to not returning to PT.     Anette Guarneri, PT, DPT 04/20/23 3:00 PM  [facility] at [facility] [address], [address] [place], [place] [address] Phone # [phone] Fax # [phone]

## 2022-11-18 ENCOUNTER — Ambulatory Visit: Payer: Medicare Other | Admitting: Physical Therapy

## 2022-11-18 ENCOUNTER — Ambulatory Visit: Payer: Medicare Other

## 2022-11-18 ENCOUNTER — Encounter: Payer: Self-pay | Admitting: Physical Therapy

## 2022-11-18 ENCOUNTER — Ambulatory Visit: Payer: Medicare Other | Admitting: Occupational Therapy

## 2022-11-18 DIAGNOSIS — I69354 Hemiplegia and hemiparesis following cerebral infarction affecting left non-dominant side: Secondary | ICD-10-CM | POA: Diagnosis not present

## 2022-11-18 DIAGNOSIS — R293 Abnormal posture: Secondary | ICD-10-CM | POA: Diagnosis not present

## 2022-11-18 DIAGNOSIS — R2681 Unsteadiness on feet: Secondary | ICD-10-CM | POA: Diagnosis not present

## 2022-11-18 DIAGNOSIS — R471 Dysarthria and anarthria: Secondary | ICD-10-CM | POA: Diagnosis not present

## 2022-11-18 DIAGNOSIS — M6281 Muscle weakness (generalized): Secondary | ICD-10-CM

## 2022-11-18 DIAGNOSIS — R41841 Cognitive communication deficit: Secondary | ICD-10-CM

## 2022-11-18 DIAGNOSIS — R2689 Other abnormalities of gait and mobility: Secondary | ICD-10-CM

## 2022-11-18 DIAGNOSIS — R4701 Aphasia: Secondary | ICD-10-CM | POA: Diagnosis not present

## 2022-11-18 NOTE — Therapy (Addendum)
OUTPATIENT OCCUPATIONAL THERAPY  Treatment Note  Patient Name: MRYTLE LYKE MRN: [id number] DOB:01-12-1941, 82 y.o., female Today's Date: 11/18/2022  PCP: [name], MD REFERRING PROVIDER: Izora Ribas, MD  END OF SESSION:  OT End of Session - 11/18/22 1612     Visit Number 8    Number of Visits 17    Date for OT Re-Evaluation 12/11/22    Authorization Type [employer]    OT Start Time 1458   pt in bathroom at session start time   OT Stop Time 1535    OT Time Calculation (min) 37 min                    Past Medical History:  Diagnosis Date   Allergy    Anemia    Anxiety    Asthma    in past, no inhalers now   Blood transfusion without reported diagnosis    Breast cancer ([place]) 01/2017   left breast    Breast cancer of upper-inner quadrant of left female breast ([place]) 03/11/2017   Cancer ([place]) 01/2017   left breast   Cataract    bilateral removed   Chronic kidney disease    kidney stones   Depression 08/31/2013   Dyslipidemia, goal LDL below 160 08/31/2013   Essential hypertension - well controlled 08/31/2013   Family history of breast cancer    Family history of melanoma    Family history of prostate cancer    GERD (gastroesophageal reflux disease)    Goals of care, counseling/discussion 03/11/2017   History of radiation therapy 10/13/17-11/11/17   left breast 40.05 Gy in 15 fractions, left breast boost 10 Gy in 5 fractions   Hypothyroidism    Ischemic stroke [facility])    May 2023;   Obesity (BMI 30-39.9) 08/31/2013   Personal history of chemotherapy 2018   Personal history of radiation therapy 2019   Thyroid disease    Vasovagal near-syncope -rare 08/31/2013   Past Surgical History:  Procedure Laterality Date   ABDOMINAL HYSTERECTOMY     total   BREAST BIOPSY Left 02/24/2017    malignant   BREAST LUMPECTOMY Left 03/23/2017   broken fingers     CATARACT EXTRACTION Bilateral    COLONOSCOPY     COLONOSCOPY WITH PROPOFOL N/A  06/03/2022   Procedure: COLONOSCOPY WITH PROPOFOL;  Surgeon: [name], MD;  Location: [facility];  Service: Gastroenterology;  Laterality: N/A;   ESOPHAGOGASTRODUODENOSCOPY (EGD) WITH PROPOFOL N/A 05/29/2022   Procedure: ESOPHAGOGASTRODUODENOSCOPY (EGD) WITH PROPOFOL;  Surgeon: Jerene Bears, MD;  Location: [facility];  Service: Gastroenterology;  Laterality: N/A;   EXCISIONAL HEMORRHOIDECTOMY     IR FLUORO GUIDE PORT INSERTION RIGHT  08/04/2017   IR REMOVAL TUN ACCESS W/ PORT W/O FL MOD SED  08/04/2017   IR US GUIDE VASC ACCESS RIGHT  08/04/2017   IRRIGATION AND DEBRIDEMENT ABSCESS Left 05/18/2017   Procedure: IRRIGATION AND DEBRIDEMENT LEFT AXILLARY ABSCESS;  Surgeon: Michael Boston, MD;  Location: [facility];  Service: General;  Laterality: Left;   kidney stones lithotripsy     POLYPECTOMY  06/03/2022   Procedure: POLYPECTOMY;  Surgeon: [name], MD;  Location: [facility];  Service: Gastroenterology;;   [place] [facility] PLACEMENT Right 03/23/2017   Procedure: INSERTION PORT-A-CATH;  Surgeon: Erroll Luna, MD;  Location: [facility];  Service: General;  Laterality: Right;   RADIOACTIVE SEED GUIDED PARTIAL MASTECTOMY WITH AXILLARY SENTINEL LYMPH NODE BIOPSY Left 03/23/2017   Procedure: LEFT BREAST RADIOACTIVE  SEED GUIDED PARTIAL MASTECTOMY AND  SENTINEL LYMPH NODE MAPPING;  Surgeon: Erroll Luna, MD;  Location: [facility];  Service: General;  Laterality: Left;   ROTATOR CUFF REPAIR     left    TONSILLECTOMY     WISDOM TOOTH EXTRACTION     Patient Active Problem List   Diagnosis Date Noted   Altered mental status 06/11/2022   Acute encephalopathy 06/10/2022   UTI (urinary tract infection) 06/10/2022   Constipation    History of stroke in adulthood 06/05/2022   Heme positive stool    Iron deficiency anemia due to chronic blood loss    Left thalamic infarction ([place]) 03/23/2022   Acquired hammer toe of right foot 08/19/2021   Adverse reaction to  ACE-I (angiotensin-converting enzyme inhibitor) 08/19/2021   Allergic rhinitis 08/19/2021   Anxiety 08/19/2021   Asthma 08/19/2021   Cardiomegaly 08/19/2021   Dependence on other enabling machines and devices 08/19/2021   History of adenomatous polyp of colon 08/19/2021   History of pneumonia 08/19/2021   Internal hemorrhoid 08/19/2021   Iron deficiency anemia secondary to inadequate dietary iron intake 08/19/2021   Osteopenia 08/19/2021   Personal history of malignant neoplasm of breast 08/19/2021   Somnolence 08/19/2021   Vitamin D deficiency 08/19/2021   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   Insomnia 06/25/2019   Obstructive sleep apnea 06/13/2019   Genetic testing 06/29/2017   Family history of melanoma    Family history of prostate cancer    Family history of breast cancer    Angioedema 05/26/2017   Left axillary seroma status post incision and drainage 05/18/2017 05/18/2017   Cellulitis of left axilla 05/17/2017   GERD (gastroesophageal reflux disease) 05/16/2017   Acquired hypothyroidism 05/16/2017   Depression with anxiety 05/16/2017   Breast cancer of upper-inner quadrant of left female breast ([place]) 03/11/2017   Goals of care, counseling/discussion 03/11/2017   Obesity (BMI 30-39.9) 08/31/2013   Essential hypertension - well controlled 08/31/2013    Class: Diagnosis of   Depression 08/31/2013    Class: Diagnosis of   Vasovagal near-syncope -rare 08/31/2013    Class: History of   Dyslipidemia, goal LDL below 160 08/31/2013    ONSET DATE: 03/20/22  REFERRING DIAG: I63.50 (ICD-10-CM) - Cerebrovascular accident (CVA) due to occlusion of cerebral artery  THERAPY DIAG:  Unsteadiness on feet  Other abnormalities of gait and mobility  Abnormal posture  Rationale for Evaluation and Treatment: Rehabilitation  SUBJECTIVE:   SUBJECTIVE STATEMENT: [name] daughter reports bringing in a front clasp bra to attempt. Pt accompanied by: self and daughter, [name]  PERTINENT  HISTORY: Past medical history of triple negative stage IIa ductal carcinoma of the left breast cancer s/p partial mastectomy 2018, HLD, HTN, CVA May 2023 with R residual deficits and expressive aphaisa.  PRECAUTIONS: Shoulder  WEIGHT BEARING RESTRICTIONS: No  PAIN:  Are you having pain? Yes: NPRS scale: 3/10 Pain location: R shoulder Pain description: sore Aggravating factors: overhead movement Relieving factors: rest  FALLS: Has patient fallen in last 6 months? Yes. Number of falls 1 fall (and another near fall)  LIVING ENVIRONMENT: Lives with: lives with their family (living with daughter Judeen Hammans since [place]) Lives in: House/apartment Stairs: Yes: External: 1 high brick steps; none Has following equipment at home: Lobbyist, Environmental consultant - 2 wheeled, Environmental consultant - 4 wheeled, shower chair, and Grab bars  PLOF: Independent  PATIENT GOALS: to be like I was before  OBJECTIVE:   HAND DOMINANCE: Right  ADLs: Overall ADLs:  aide assists with majority of bathing/dressing Transfers/ambulation related to ADLs: Supervision with RW in home, Rollator in community Eating: difficulty with cutting foods, spilling food off utensil due to inconsistent tremor like movements Grooming: Supervision UB Dressing: Max assist LB Dressing: Max assist Toileting: assist to pull up pants after toileting Bathing: Mod assist Tub Shower transfers: Min - HHA with transfers in/out of tub/shower Equipment: Shower seat with back and Grab bars  IADLs: Meal Prep: will occasionally pour cereal or fix oatmeal, aide does majority of other meal tasks Medication management: [facility] prepares packages of meds as prescribed and caregiver hands them to pt to take  UPPER EXTREMITY ROM:    Active ROM Right eval Left eval  Shoulder flexion 84 (h/o shoulder injury and surgery) 95  Shoulder abduction    Shoulder adduction    Shoulder extension    Shoulder internal rotation    Shoulder external rotation    Elbow  flexion 80*   Elbow extension -10*   Wrist flexion    Wrist extension    Wrist ulnar deviation    Wrist radial deviation    Wrist pronation    Wrist supination    (Blank rows = not tested)  UPPER EXTREMITY MMT:   not tested due to pain with AROM and attempts at PROM   COORDINATION: 9 Hole Peg test: Right: 53.41 sec; Left: 58.41 sec Box and Blocks:  Right 21 blocks, Left 23 blocks   OBSERVATIONS: Pt with flat affect and somewhat withdrawn.  [name] daughter reports that pt was able to complete ADLs at higher level after leaving [facility] but has allowed aide to assist with ADLs and provide hand held assist with transitional movements.   TODAY'S TREATMENT:                                                                        11/18/22 UB dressing: engaged in donning front clasp bra with focus on technique and problem solving increased ease with donning bra.  Pt demonstrating difficulty reaching behind back to thread arms into straps, but also with difficulty tossing it over head/shoulders, therefore terminated task.  Attempted fastening bra in front on lap and then pulling it overhead like a pullover shirt.  This particular bra too tight, therefore again terminated task.  OT reiterated recommendation to fasten bra in front, rotate around torso, and then put arms through straps and pull up.   Jacket: engaged in donning/doffing jacket with focus on large amplitude technique.  Pt continues to demonstrate decreased shoulder internal rotation to reach behind back to obtain jacket to don 2nd sleeve.  Discussed weaning assist, as daughter reports they may be helping pt too much with donning/doffing jacket.  Encouraged weaning assistance as pt has become accustomed to having assist from family members and caregiver.   11/16/22 Therapeutic exercise: engaged in ball rotation around torso with 1# medicine ball to simulate rotating bra strap around. Pt completed rotations to both R and L with focus on  quality of movement.  OT then directed pt to complete with gait belt around upper torso to simulate bra strap. Pt able to complete with increased time and effort. LB dressing: pt's daughter reports that pt was able to don socks and  shoes as well as lacing up boots over the weekend.  UB dressing: pt reports that she continues to struggle with hooking bra.  Discussed various other types of bras with recommendation to attempt a front closure bra with only one clasp to increase success.  Pt initially hesitant, but agreeable to attempting.  Discussed appropriate fit of bra as well to ensure ease of donning. Therapist provided written instructions for pt and caregivers to increase carryover of education between variety of caregivers and to decrease burden of care.   11/11/22 UB dressing: completed massed practice with donning/doffing pullover shirt.  Pt able to complete with increased time and min cues for reaching across to opposite shoulder to adjust shirt when stuck around shoulders/neck.  Pt able to complete with increased ease with repetition.  Pt reports difficulty with donning bra.  Discussed alternative bra types and techniques.  Educated on exercise to simulate sliding bra band around torso with reaching around back with ball or rotating belt clockwise and counterclockwise around waist.   LB dressing/toileting: pt and caregiver reporting that pt is able to complete clothing management with increased independence, only requiring assist if wearing certain pants that may be too tight.  Leisure pursuits: discussed pursuing previous leisure tasks to increase motivation and engagement in tasks.  Pt enjoys scapbooking.  Discussed setting up a station at home to allow pt to resume this activity as pt continues to demonstrate decreased motivation and engagement in tasks.  Caregiver present and agreeable to assist with setup and engagement for more purposeful tasks.   PATIENT EDUCATION: Education details:  strategies for UB dressing Person educated: Patient and Child(ren) Education method: Customer service manager Education comprehension: verbalized understanding and needs further education  HOME EXERCISE PROGRAM: TBD   GOALS: Goals reviewed with patient? Yes  SHORT TERM GOALS: Target date: 11/13/22  Pt  and caregiver will verbalize understanding of task modifications and/or potential AE needs to increase ease, safety, and independence w/ ADLs. Baseline: Goal status: MET - 11/11/22  2.  Pt will complete toilet transfer with supervision and no verbal cues 5 out of 6 attempts to demonstrate improved mobility and progress to decreasing level of supervision. Baseline: currently CGA to min assist Goal status: MET - 11/11/22  3.  Pt will be able to manage clothing up/down for toileting with distant supervision to progress to increased independence with toileting tasks. Baseline: currently min-mod assist Goal status: MET - 11/09/22  4.  Pt will be able to complete UB dressing with supervision assist (with exception of bra). Baseline: currently max assist Goal status: MET - 11/11/22  LONG TERM GOALS: Target date: 12/11/22  Pt will be able to complete LB dressing with supervision, to include donning socks and shoes. Baseline: currently max assist Goal status: IN PROGRESS  2.  Pt will be able to don bra at Mod I level. Baseline: unable to complete Goal status: IN PROGRESS  3.  Pt will be able to complete all aspects of toileting (transfer, clothing management, and hygiene) at Mod I level with LRAD. Baseline: Min-mod assist Goal status: IN PROGRESS  4.  Pt will be able to maintain dynamic standing balance for 10 mins w/o LOB using DME and/or countertop support prn to increase ease, safety, and independence with engagement in meal prep tasks. Baseline:  Goal status: IN PROGRESS  5.  Patient will demonstrate sufficient range of motion and activity tolerance to use BUE to shampoo her  hair in the shower  Baseline: RUE 84* and LUE  95* shoulder ROM Goal status: IN PROGRESS   ASSESSMENT:  CLINICAL IMPRESSION: Pt continues to report feeling that her family and caregiver provide inconsistent styles of assistance.  Daughter, [name], even reports that family members may have fallen in to a bad habit of helping too much.  Encouraged family members to wean assistance instead of going "cold [place]" as pt may not respond well.  Engaged in discussion about mood/motivation to participate in exercises and self-care tasks.  Encouraged pt and family to discuss mood with PCP at upcoming visit.  Pt is demonstrating improvements towards goals, however continues to fluctuate greatly due to memory and motivation.   PERFORMANCE DEFICITS: in functional skills including ADLs, IADLs, coordination, ROM, strength, pain, flexibility, Gross motor control, balance, body mechanics, endurance, decreased knowledge of precautions, decreased knowledge of use of DME, and UE functional use, cognitive skills including emotional, energy/drive, problem solving, safety awareness, and sequencing, and psychosocial skills including coping strategies, environmental adaptation, habits, and routines and behaviors.   IMPAIRMENTS: are limiting patient from ADLs and IADLs.   CO-MORBIDITIES: may have co-morbidities  that affects occupational performance. Patient will benefit from skilled OT to address above impairments and improve overall function.  MODIFICATION OR ASSISTANCE TO COMPLETE EVALUATION: Min-Moderate modification of tasks or assist with assess necessary to complete an evaluation.  OT OCCUPATIONAL PROFILE AND HISTORY: Problem focused assessment: Including review of records relating to presenting problem.  CLINICAL DECISION MAKING: Moderate - several treatment options, min-mod task modification necessary  REHAB POTENTIAL: Good  EVALUATION COMPLEXITY: Moderate    PLAN:  OT FREQUENCY: 2x/week  OT DURATION: 8  weeks  PLANNED INTERVENTIONS: self care/ADL training, therapeutic exercise, therapeutic activity, neuromuscular re-education, manual therapy, passive range of motion, balance training, functional mobility training, ultrasound, compression bandaging, moist heat, cryotherapy, cognitive remediation/compensation, psychosocial skills training, energy conservation, coping strategies training, and DME and/or AE instructions  RECOMMENDED OTHER SERVICES: NA  CONSULTED AND AGREED WITH PLAN OF CARE: Patient and family member/caregiver  PLAN FOR NEXT SESSION: Reiterate safety with mobility (use of RW!), BUE ROM for reaching and self-care tasks, dynamic standing balance.     Simonne Come, OTR/L 11/18/2022, 4:12 PM   OCCUPATIONAL THERAPY DISCHARGE SUMMARY  Visits from Start of Care: 8  Current functional level related to goals / functional outcomes: Unable to assess; pt did not return since 1/17 visit.  Pt with severe depression and lack of motivation impacting progress towards goals.  [name] family hoping to get additional medical intervention for mood/depression to increase engagement in therapy sessions.   Remaining deficits: Decreased shoulder ROM, decreased speed and endurance during ADLs/IADLs, decreased carryover of education   Education / Equipment: Provided written strategies for sequencing with dressing tasks, and UE exercises   Patient agrees to discharge. Patient goals were not met. Patient is being discharged due to not returning since the last visit.Marland Kitchen   Simonne Come, OTR/L 01/07/23

## 2022-11-18 NOTE — Patient Instructions (Addendum)
   Today you said, "I don't see the reason I even come", so we talked a lot today about you looking at the potential progress you could make in therapy (which will improve your situation), instead of focusing on the current changes to your situation (what you say you've "lost").  I suggested you and [name] and Judeen Hammans talk with your MD about changes in your mood meds to find something that is more helpful in getting you to that point, because your mood is one of the biggest barriers to you making progress in therapy right now.

## 2022-11-18 NOTE — Therapy (Signed)
OUTPATIENT SPEECH LANGUAGE PATHOLOGY TREATMENT   Patient Name: Jessica [name] MRN: [id number] DOB:20-Feb-1941, 82 y.o., female Today's Date: 11/18/2022  PCP: [name], MD  REFERRING PROVIDER: Heywood Iles, MD  END OF SESSION:  End of Session - 11/18/22 2232     Visit Number 7    Number of Visits 25    Date for SLP Re-Evaluation 01/11/23    SLP Start Time 1321    SLP Stop Time  1402    SLP Time Calculation (min) 41 min    Activity Tolerance Other (comment)   limited by flat affect/mood (see above)                  Past Medical History:  Diagnosis Date   Allergy    Anemia    Anxiety    Asthma    in past, no inhalers now   Blood transfusion without reported diagnosis    Breast cancer ([address]) 01/2017   left breast    Breast cancer of upper-inner quadrant of left female breast ([place]) 03/11/2017   Cancer ([place]) 01/2017   left breast   Cataract    bilateral removed   Chronic kidney disease    kidney stones   Depression 08/31/2013   Dyslipidemia, goal LDL below 160 08/31/2013   Essential hypertension - well controlled 08/31/2013   Family history of breast cancer    Family history of melanoma    Family history of prostate cancer    GERD (gastroesophageal reflux disease)    Goals of care, counseling/discussion 03/11/2017   History of radiation therapy 10/13/17-11/11/17   left breast 40.05 Gy in 15 fractions, left breast boost 10 Gy in 5 fractions   Hypothyroidism    Ischemic stroke [facility])    May 2023;   Obesity (BMI 30-39.9) 08/31/2013   Personal history of chemotherapy 2018   Personal history of radiation therapy 2019   Thyroid disease    Vasovagal near-syncope -rare 08/31/2013   Past Surgical History:  Procedure Laterality Date   ABDOMINAL HYSTERECTOMY     total   BREAST BIOPSY Left 02/24/2017    malignant   BREAST LUMPECTOMY Left 03/23/2017   broken fingers     CATARACT EXTRACTION Bilateral    COLONOSCOPY     COLONOSCOPY WITH PROPOFOL  N/A 06/03/2022   Procedure: COLONOSCOPY WITH PROPOFOL;  Surgeon: [name], MD;  Location: [facility];  Service: Gastroenterology;  Laterality: N/A;   ESOPHAGOGASTRODUODENOSCOPY (EGD) WITH PROPOFOL N/A 05/29/2022   Procedure: ESOPHAGOGASTRODUODENOSCOPY (EGD) WITH PROPOFOL;  Surgeon: Jerene Bears, MD;  Location: [facility];  Service: Gastroenterology;  Laterality: N/A;   EXCISIONAL HEMORRHOIDECTOMY     IR FLUORO GUIDE PORT INSERTION RIGHT  08/04/2017   IR REMOVAL TUN ACCESS W/ PORT W/O FL MOD SED  08/04/2017   IR US GUIDE VASC ACCESS RIGHT  08/04/2017   IRRIGATION AND DEBRIDEMENT ABSCESS Left 05/18/2017   Procedure: IRRIGATION AND DEBRIDEMENT LEFT AXILLARY ABSCESS;  Surgeon: Michael Boston, MD;  Location: [facility];  Service: General;  Laterality: Left;   kidney stones lithotripsy     POLYPECTOMY  06/03/2022   Procedure: POLYPECTOMY;  Surgeon: [name], MD;  Location: [facility];  Service: Gastroenterology;;   Sol Passer PLACEMENT Right 03/23/2017   Procedure: INSERTION PORT-A-CATH;  Surgeon: Erroll Luna, MD;  Location: [place];  Service: General;  Laterality: Right;   RADIOACTIVE SEED GUIDED PARTIAL MASTECTOMY WITH AXILLARY SENTINEL LYMPH NODE BIOPSY Left 03/23/2017   Procedure: LEFT BREAST RADIOACTIVE SEED  GUIDED PARTIAL MASTECTOMY AND  SENTINEL LYMPH NODE MAPPING;  Surgeon: Erroll Luna, MD;  Location: [place];  Service: General;  Laterality: Left;   ROTATOR CUFF REPAIR     left    TONSILLECTOMY     WISDOM TOOTH EXTRACTION     Patient Active Problem List   Diagnosis Date Noted   Altered mental status 06/11/2022   Acute encephalopathy 06/10/2022   UTI (urinary tract infection) 06/10/2022   Constipation    History of stroke in adulthood 06/05/2022   Heme positive stool    Iron deficiency anemia due to chronic blood loss    Left thalamic infarction ([place]) 03/23/2022   Acquired hammer toe of right foot 08/19/2021   Adverse reaction  to ACE-I (angiotensin-converting enzyme inhibitor) 08/19/2021   Allergic rhinitis 08/19/2021   Anxiety 08/19/2021   Asthma 08/19/2021   Cardiomegaly 08/19/2021   Dependence on other enabling machines and devices 08/19/2021   History of adenomatous polyp of colon 08/19/2021   History of pneumonia 08/19/2021   Internal hemorrhoid 08/19/2021   Iron deficiency anemia secondary to inadequate dietary iron intake 08/19/2021   Osteopenia 08/19/2021   Personal history of malignant neoplasm of breast 08/19/2021   Somnolence 08/19/2021   Vitamin D deficiency 08/19/2021   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   Insomnia 06/25/2019   Obstructive sleep apnea 06/13/2019   Genetic testing 06/29/2017   Family history of melanoma    Family history of prostate cancer    Family history of breast cancer    Angioedema 05/26/2017   Left axillary seroma status post incision and drainage 05/18/2017 05/18/2017   Cellulitis of left axilla 05/17/2017   GERD (gastroesophageal reflux disease) 05/16/2017   Acquired hypothyroidism 05/16/2017   Depression with anxiety 05/16/2017   Breast cancer of upper-inner quadrant of left female breast ([place]) 03/11/2017   Goals of care, counseling/discussion 03/11/2017   Obesity (BMI 30-39.9) 08/31/2013   Essential hypertension - well controlled 08/31/2013    Class: Diagnosis of   Depression 08/31/2013    Class: Diagnosis of   Vasovagal near-syncope -rare 08/31/2013    Class: History of   Dyslipidemia, goal LDL below 160 08/31/2013    ONSET DATE: May 2023   REFERRING DIAG: I63.50 (ICD-10-CM) - Cerebrovascular accident (CVA) due to occlusion of cerebral artery ([place])  THERAPY DIAG:  Cognitive communication deficit  Dysarthria and anarthria  Aphasia  Rationale for Evaluation and Treatment: Rehabilitation  SUBJECTIVE:   SUBJECTIVE STATEMENT: "I don't see the reason I even come."  Pt accompanied by:  self    PERTINENT HISTORY:  Presented 03/20/2022 with right-sided  weakness slurred speech and altered mental status with right facial droop of acute onset.  CT/MRI showed small focus of acute ischemia within the left caudate body.  No hemorrhage or mass effect.  Old right basal ganglia small vessel infarct and findings of chronic microvascular disease.  Patient did not receive tPA.  CT angiogram head and neck no emergent large vessel occlusion.  Echocardiogram ejection fraction of 60 to 65% no wall motion abnormalities.  Pt was [facility] to [facility] on 05/[facility]/2022 with suspected acute lower gastrointestinal bleed complicated by acute blood loss anemia after presenting from home to [facility] ED complaining of low hemoglobin per outpatient labs.  In September 2023 Jessica [name] was brought by her daughters after a fall at home. She was leaning over to pick up a piece of paper and lost her balance hitting her nose and L cheek on the ground. Unsure if  she also hit her head. Did not have LOC. Not on a blood thinner. No other reports of injuries.  She rec'd HHST, which, according to daughter focused mostly on swallowing.  PAIN:  Are you having pain? No  PATIENT GOALS: Improve memory  OBJECTIVE:   DIAGNOSTIC FINDINGS:  07/07/22 - IMPRESSION: 1. No acute intracranial abnormality. No skull fracture. 2. Stable atrophy, chronic small vessel ischemia, and remote bilateral basal gangliar lacunar infarcts.  PATIENT REPORTED OUTCOME MEASURES (PROM): Cognitive Function:   and Communication Participation Item Bank: to be provided.   TODAY'S TREATMENT:                                                                                                                                         DATE:  11/18/22: Calendar tasks: pt pulled planner out when SLP asked when she returns to therapy after today. With extra time pt told SLP correct answer (11/23/22). Min cue initially for day/date today. Jessica [name] was unsure of how it is going with Jessica [name] asking Jessica [name] for the medication at meals.  As  Jessica [name] said, "I don't see the reason I even come", Jessica [name] and SLP talked a LOT today about her looking at potential progress - which will change her situation, instead of focusing on the current changes to her situation. SLP suggested she talk with her MD about changes in her mood meds to find something that is more helpful in getting her to that point, and that her mood was her biggest barrier to her making progress right now. SLP printed out AVS for pt with this information included both so she would recall this conversation and to show Jessica [name] what we talked about.  11/16/22: When SLP asked about Jessica [name], Jessica [name] told SLP she "couldn't come due to company from [place]." Pt came back to [place] room without Jessica [name]. She directed SLP to take out her planner when asked where her planner was. She forgot her reading glasses so SLP put reminder in the front of her planner. SLP reviewed memory strategies and highlighted to pt how she was using some of these strategies. SLP assisted pt with placing date of her grandson's birthday and date of his party on her calendar. She req'd mod A for today's date although all dates were crossed off until today. She told SLP of her grandson's birthday on Friday however did not recall date of his party - Jessica [name] corrected. Jessica [name] is cueing pt to ask her for her meds each meal - Jessica [name] demo'd fristration with this but was reminded this is a stepping stone to her walking over safely to get her own meds at some time in the future.   11/11/22: SLP asked pt who she wanted to attend speech therapy with her. Pt did not indicate Jessica [name] nor Jessica [name] so SLP took pt by herself. Pt was most talkative she has been today with SLP in [place] room. Pt  conveyed feelings of hopelessness and despair, but no mention of harming herself. SLP believes pt is having trouble dealing with feelings about the decreasing of her independence and changes due to CVA. SLP used skilled observation to ascertain pt's aphasia is not as  severe as originally thought - pt was not talkative up to now likely due to psychological factors. No difficulty finding words or generating spoken language today. Pt stated the calendar is helpful in her memory book. With initial cues to find today's date she told SLP her schedule today, what she had remaining, her next therapy date and time for ST. She would like to have more things on her calendar and SLP suggested ANYTHING that includes pt should be added - SLP shared this with aide and daughter at end of session. Pt talked about wanting to do more but thinking her daughters were too busy or would not allow it, or wouldn't allow the extent of the activity premorbidly. SLP assisted pt in some anticipatory awareness and after more detailed explanation to rationale pt stated she understood.   07/10/32: SLP ascertained table tent for "medicine" still not being used. Jessica [name] stated they will begin to do this today. SLP cued pt about her memory notebook, which pt showed SLP correct tab to find PT exercises, OT exercises and ST handout - all with extra time. SLP, throughout session, provided education for Kim for pt to set timers, keep a log of daily activities, cross off calendar days, and to request her medication after her meals. SLP educated pt and Jessica [name] about memory strategies.   11/04/22: Pt did not have opportunity to use table tent as Jessica [name] had family emergency and she could not communicate info to [place] to put into practice. SLP encouraged this to occur soon. Jessica [name] brought a 3 ring binder with notebook paper, a loose calendar with a pen, and all therapy papers in a pocket. SLP had pt pt write tabs ST PT and OT - pt wrote 2/3 legibly. She organized with mod A (put OT HEP with her PT HEP), but separated ST papers correctly. SLP provided a calendar of January 2024 and suggested that Jessica [name] write the therapies in the dates and times to which Jessica [name] tells her. SLP highlighted that we want [place] to do as much as she can  safely do - due to entries on the loose calendar being ~90% illegible.  SLP strongly suggested Jessica [name] attend a therapy session for carryover of OT and PT exercises. SLP req'd to ask Jessica [name] to repeat 25% of her utterances. SLP not convinced pt desires ST after today's session. SLP to cont to develop rapport with pt and highlight benefits of ST to pt's rehab journey. Memory strategies to be discussed next session as SLP targeted memory notebook/system today as daughter indicated it was brought to ST.  10/16/22: Will discuss memory compensations further next session. Discussed pt's medication and timing with pt today. Jessica [name] told SLP incorrect schedule apart from mealtimes and bedtime. Jessica [name] assisted. SLP suggested table tent for med reminder to tell Jessica [name] it was time for medication administration. Pt shared she does not like taste of Potassium and feeds it to the dog. SLP assisted problem solving with pt and daughter about how to achieve goal of taking Potassium and also having alternative to taste (brand with heavier coating). SLP suggested calendar for pt to view appointments - dtr to buy calendar and put appointments on it.  10/13/22: SLP discussed findings so far in evaluation. SLP will need  to perform further assessment once rapport is developed with pt as she did not freely offer much information today SLP suspects due to reason that she does not desire tx for dysarthria. During evaluation pt stated she has trouble with memory and so SLP assessed memory. SLP hopes to eventually, as pt warms to him and ST in general, target pt's soft conversational speech.  PATIENT EDUCATION: Education details: as above in "today's treatment" Person educated: Patient and Child(ren) Education method: Explanation Education comprehension: verbalized understanding and needs further education   GOALS: Goals reviewed with patient? Yes  SHORT TERM GOALS: Target date: 11-20-22  Pt will bring memory compensation  device/system to 60% of ST sessions Baseline: (5/5) Goal status: Met  2.  Pt will indicate knowledge she can use memory system for functional tasks (I.e., will open/acess system when asked her therapy schedule) x 3 sessions Baseline: 11/11/22, 11/16/22 Goal status: Met  3.  Undergo more in depth cognitive communication testing in first 4 sessions Baseline:  Goal status: Ongoing  4.  Jessica [name] will indicate she has told daughter/caregiver to administer her meds for 7 consecutive days Baseline:  Goal status: Ongoing   LONG TERM GOALS: Target date: 01/10/23  Jessica [name] will track appointments and other social events using compensations in 5 sessions Baseline:  Goal status: Ongoing  2.  Jessica [name] will indicate she has administered her meds correctly with SBA for 7 consecutive days Baseline:  Goal status: Ongoing  3.  Jessica [name] will indicate she feels more confident talking than initial sessions by improved PRM score/s Baseline:  Goal status: Ongoing   ASSESSMENT:  CLINICAL IMPRESSION: Patient is a 82 y.o. female who was seen today for treatment of memory in language. Discussion on memory compensation system continued today with pt's memory book. Calendar tasks also targeted. Voice volume was mid 60s dB.  [name] biggest barrier to progress at this time is her depressive feelings about her current situation.   OBJECTIVE IMPAIRMENTS: include attention, memory, aphasia, and dysarthria. These impairments are limiting patient from managing medications, managing appointments, managing finances, household responsibilities, ADLs/IADLs, and effectively communicating at home and in community. Factors affecting potential to achieve goals and functional outcome are ability to learn/carryover information and cooperation/participation level.. Patient will benefit from skilled SLP services to address above impairments and improve overall function.  REHAB POTENTIAL: Fair given pt's motivation/response to SLP  about desire for ST today during eval  PLAN:  SLP FREQUENCY: 2x/week  SLP DURATION: 12 weeks  PLANNED INTERVENTIONS: Language facilitation, Environmental controls, Cueing hierachy, Cognitive reorganization, Internal/external aids, Oral motor exercises, Functional tasks, Multimodal communication approach, SLP instruction and feedback, Compensatory strategies, and Patient/family education    [facility], [place] 11/18/2022, 10:33 PM

## 2022-11-23 ENCOUNTER — Ambulatory Visit: Payer: Medicare Other | Admitting: Occupational Therapy

## 2022-11-23 ENCOUNTER — Ambulatory Visit: Payer: Medicare Other

## 2022-11-23 ENCOUNTER — Ambulatory Visit: Payer: Medicare Other | Admitting: Physical Therapy

## 2022-11-24 DIAGNOSIS — Z853 Personal history of malignant neoplasm of breast: Secondary | ICD-10-CM | POA: Diagnosis not present

## 2022-11-25 ENCOUNTER — Ambulatory Visit: Payer: Medicare Other

## 2022-11-25 ENCOUNTER — Ambulatory Visit: Payer: Medicare Other | Admitting: Occupational Therapy

## 2022-11-25 ENCOUNTER — Ambulatory Visit: Payer: Medicare Other | Admitting: Physical Therapy

## 2022-11-27 ENCOUNTER — Inpatient Hospital Stay: Payer: Medicare Other

## 2022-11-27 ENCOUNTER — Inpatient Hospital Stay: Payer: Medicare Other | Attending: Hematology & Oncology

## 2022-11-27 ENCOUNTER — Encounter: Payer: Self-pay | Admitting: Hematology & Oncology

## 2022-11-27 ENCOUNTER — Other Ambulatory Visit: Payer: Self-pay

## 2022-11-27 ENCOUNTER — Inpatient Hospital Stay (HOSPITAL_BASED_OUTPATIENT_CLINIC_OR_DEPARTMENT_OTHER): Payer: Medicare Other | Admitting: Hematology & Oncology

## 2022-11-27 VITALS — BP 124/59 | HR 71 | Temp 97.9°F | Resp 18 | Ht 63.0 in | Wt 173.2 lb

## 2022-11-27 DIAGNOSIS — C50912 Malignant neoplasm of unspecified site of left female breast: Secondary | ICD-10-CM | POA: Diagnosis not present

## 2022-11-27 DIAGNOSIS — C50212 Malignant neoplasm of upper-inner quadrant of left female breast: Secondary | ICD-10-CM

## 2022-11-27 DIAGNOSIS — Z171 Estrogen receptor negative status [ER-]: Secondary | ICD-10-CM | POA: Diagnosis not present

## 2022-11-27 DIAGNOSIS — Z8673 Personal history of transient ischemic attack (TIA), and cerebral infarction without residual deficits: Secondary | ICD-10-CM | POA: Insufficient documentation

## 2022-11-27 DIAGNOSIS — Z95828 Presence of other vascular implants and grafts: Secondary | ICD-10-CM

## 2022-11-27 DIAGNOSIS — L03112 Cellulitis of left axilla: Secondary | ICD-10-CM | POA: Diagnosis not present

## 2022-11-27 LAB — CMP (CANCER CENTER ONLY)
ALT: 16 U/L (ref 0–44)
AST: 15 U/L (ref 15–41)
Albumin: 4.2 g/dL (ref 3.5–5.0)
Alkaline Phosphatase: 60 U/L (ref 38–126)
Anion gap: 8 (ref 5–15)
BUN: 15 mg/dL (ref 8–23)
CO2: 33 mmol/L — ABNORMAL HIGH (ref 22–32)
Calcium: 9.9 mg/dL (ref 8.9–10.3)
Chloride: 98 mmol/L (ref 98–111)
Creatinine: 0.82 mg/dL (ref 0.44–1.00)
GFR, Estimated: >60 mL/min (ref >60)
Glucose, Bld: 120 mg/dL — ABNORMAL HIGH (ref 70–99)
Potassium: 3.1 mmol/L — ABNORMAL LOW (ref 3.5–5.1)
Sodium: 139 mmol/L (ref 135–145)
Total Bilirubin: 0.4 mg/dL (ref 0.3–1.2)
Total Protein: 6.8 g/dL (ref 6.5–8.1)

## 2022-11-27 LAB — CBC WITH DIFFERENTIAL (CANCER CENTER ONLY)
Abs Immature Granulocytes: 0 10*3/uL (ref 0.00–0.07)
Basophils Absolute: 0.1 10*3/uL (ref 0.0–0.1)
Basophils Relative: 1 %
Eosinophils Absolute: 0.6 10*3/uL — ABNORMAL HIGH (ref 0.0–0.5)
Eosinophils Relative: 10 %
HCT: 40.5 % (ref 36.0–46.0)
Hemoglobin: 13.5 g/dL (ref 12.0–15.0)
Immature Granulocytes: 0 %
Lymphocytes Relative: 25 %
Lymphs Abs: 1.4 10*3/uL (ref 0.7–4.0)
MCH: 33.4 pg (ref 26.0–34.0)
MCHC: 33.3 g/dL (ref 30.0–36.0)
MCV: 100.2 fL — ABNORMAL HIGH (ref 80.0–100.0)
Monocytes Absolute: 0.4 10*3/uL (ref 0.1–1.0)
Monocytes Relative: 7 %
Neutro Abs: 3.2 10*3/uL (ref 1.7–7.7)
Neutrophils Relative %: 57 %
Platelet Count: 245 10*3/uL (ref 150–400)
RBC: 4.04 MIL/uL (ref 3.87–5.11)
RDW: 12.1 % (ref 11.5–15.5)
WBC Count: 5.6 10*3/uL (ref 4.0–10.5)
nRBC: 0 % (ref 0.0–0.2)

## 2022-11-27 LAB — LACTATE DEHYDROGENASE: LDH: 125 U/L (ref 98–192)

## 2022-11-27 MED ORDER — HEPARIN SOD (PORK) LOCK FLUSH 100 UNIT/ML IV SOLN
500.0000 [IU] | Freq: Once | INTRAVENOUS | Status: AC
  Administered 2022-11-27: 500 [IU] via INTRAVENOUS

## 2022-11-27 MED ORDER — SODIUM CHLORIDE 0.9% FLUSH
10.0000 mL | Freq: Once | INTRAVENOUS | Status: AC
  Administered 2022-11-27: 10 mL via INTRAVENOUS

## 2022-11-27 NOTE — Progress Notes (Signed)
Hematology and Oncology Follow Up Visit  Jessica [name] [id number] Mar 22, 1941 82 y.o. 11/27/2022   Principle Diagnosis:  Stage IIA ([id number]) invasive ductal ca of the LEFT breast - TRIPLE NEGATIVE Staphylococcus aureus wound infection of the LEFT axilla Cellulitis of the left breast  Past Therapy:   Taxotere/Carboplatin - s/p cycle #4 -completed adjuvant therapy on 09/09/2017 Radiation therapy to the right breast.-Completed 4005 rad with an additional 1000 rad boost on 11/11/2017  Current Therapy: Observation   Interim History:  Jessica [name] is here today for follow-up.  Unfortunately, since we last saw Jessica [name], she did have a CVA.  This was of the back in August.  She did have a brain scan.  She had MRI of the brain.  She does have a flat affect.  She currently is living with a daughter.  I really think that she probably would benefit from being in Assisted Living.  It is hard for Jessica [name] family just to be with Jessica [name] all the time.  Jessica [name] daughter has to work.  I had a long talk with Jessica [name].  I think that she may be somewhat agreeable to this.  Again, I think it would be nice if she could be with adults who are around Jessica [name] maturity that she could have more interaction with and do some activities with.  I really think this would help Jessica [name] cognitively.  Otherwise, I think she is managing.  She has had no problems with nausea or vomiting.  It is hard to say how much she is eating.  She has had no problems with pain.  There is been no problems with bowels or bladder.  She has had some constipation in the past.  There is been no problems with rashes.  She has had no leg swelling.  I think she is avoided COVID.  Overall, I would say that Jessica [name] performance status is probably ECOG 2-3.     Medications:  Allergies as of 11/27/2022       Reactions   Advil [ibuprofen] Hives, Itching   Codeine Hives   Crestor [rosuvastatin Calcium] Other (See Comments)   Other reaction(s): pain   Penicillins Hives    Has patient had a PCN reaction causing immediate rash, facial/tongue/throat swelling, SOB or lightheadedness with hypotension: yes Has patient had a PCN reaction causing severe rash involving mucus membranes or skin necrosis:  no Has patient had a PCN reaction that required hospitalization:  no Has patient had a PCN reaction occurring within the last 10 years: no If all of the above answers are "NO", then may proceed with Cephalosporin use. Has patient had a PCN reaction causing immediate rash, facial/tongue/throat swelling, SOB or lightheadedness with hypotension: yes Has patient had a PCN reaction causing severe rash involving mucus membranes or skin necrosis:  no Has patient had a PCN reaction that required hospitalization:  no Has patient had a PCN reaction occurring within the last 10 years: no If all of the above answers are "NO", then may proceed with Cephalosporin use.   Sulfamethoxazole-trimethoprim Hives   Bacitracin-polymyxin B Dermatitis, Rash   Benazepril Swelling   Possible cause of tongue swelling   Other Rash   Band-aid        Medication List        Accurate as of November 27, 2022  2:02 PM. If you have any questions, ask your nurse or doctor.          acetaminophen 325 MG tablet Commonly known as: TYLENOL Take 2 tablets (  650 mg total) by mouth every 6 (six) hours as needed for mild pain (or Fever >/= 101).   amLODipine 10 MG tablet Commonly known as: NORVASC Take 1 tablet (10 mg total) by mouth daily.   ascorbic acid 500 MG tablet Commonly known as: VITAMIN C Take 1 tablet (500 mg total) by mouth daily.   aspirin 81 MG chewable tablet Chew 1 tablet (81 mg total) by mouth daily.   atorvastatin 80 MG tablet Commonly known as: LIPITOR Take 1 tablet (80 mg total) by mouth at bedtime. What changed: when to take this   buPROPion 150 MG 24 hr tablet Commonly known as: WELLBUTRIN XL Take 1 tablet (150 mg total) by mouth every morning. What changed: when to  take this   cephALEXin 250 MG capsule Commonly known as: KEFLEX Take 250 mg by mouth daily.   cholecalciferol 25 MCG (1000 UNIT) tablet Commonly known as: VITAMIN D3 Take 1 tablet (1,000 Units total) by mouth daily.   diclofenac Sodium 1 % Gel Commonly known as: VOLTAREN Apply 2 g topically 4 (four) times daily as needed (shoulder pain).   docusate sodium 100 MG capsule Commonly known as: Colace Take 1 capsule (100 mg total) by mouth 2 (two) times daily.   estradiol 0.1 MG/GM vaginal cream Commonly known as: ESTRACE Place vaginally.   ferrous sulfate 325 (65 FE) MG tablet Take 1 tablet (325 mg total) by mouth daily with breakfast.   folic acid 1 MG tablet Commonly known as: FOLVITE Take 1 mg by mouth 2 (two) times daily.   hydrochlorothiazide 25 MG tablet Commonly known as: HYDRODIURIL Take 25 mg by mouth daily.   hydrochlorothiazide 12.5 MG capsule Commonly known as: MICROZIDE Take 12.5 mg by mouth daily.   hydrocortisone 2.5 % rectal cream Commonly known as: ANUSOL-HC Apply 1 Application topically 2 (two) times daily.   lactulose 10 GM/15ML solution Commonly known as: CHRONULAC Take 45 mLs (30 g total) by mouth 2 (two) times daily as needed for mild constipation or moderate constipation.   levothyroxine 75 MCG tablet Commonly known as: SYNTHROID Take 1 tablet (75 mcg total) by mouth daily.   modafinil 100 MG tablet Commonly known as: PROVIGIL Take 1 tablet (100 mg total) by mouth daily.   OCUVITE ADULT FORMULA PO Take 1 tablet by mouth daily.   pantoprazole 40 MG tablet Commonly known as: PROTONIX Take 1 tablet (40 mg total) by mouth 2 (two) times daily.   potassium chloride 10 MEQ tablet Commonly known as: KLOR-CON Take by mouth.   venlafaxine XR 75 MG 24 hr capsule Commonly known as: EFFEXOR-XR Take 3 capsules (225 mg total) by mouth daily.   Vitamin D (Ergocalciferol) 1.25 MG (50000 UNIT) Caps capsule Commonly known as: DRISDOL Take 1 capsule  (50,000 Units total) by mouth every 7 (seven) days.        Allergies:  Allergies  Allergen Reactions   Advil [Ibuprofen] Hives and Itching   Codeine Hives   Crestor [Rosuvastatin Calcium] Other (See Comments)    Other reaction(s): pain   Penicillins Hives    Has patient had a PCN reaction causing immediate rash, facial/tongue/throat swelling, SOB or lightheadedness with hypotension: yes Has patient had a PCN reaction causing severe rash involving mucus membranes or skin necrosis:  no Has patient had a PCN reaction that required hospitalization:  no Has patient had a PCN reaction occurring within the last 10 years: no If all of the above answers are "NO", then may proceed with Cephalosporin  use. Has patient had a PCN reaction causing immediate rash, facial/tongue/throat swelling, SOB or lightheadedness with hypotension: yes Has patient had a PCN reaction causing severe rash involving mucus membranes or skin necrosis:  no Has patient had a PCN reaction that required hospitalization:  no Has patient had a PCN reaction occurring within the last 10 years: no If all of the above answers are "NO", then may proceed with Cephalosporin use.    Sulfamethoxazole-Trimethoprim Hives   Bacitracin-Polymyxin B Dermatitis and Rash   Benazepril Swelling    Possible cause of tongue swelling   Other Rash    Band-aid    Past Medical History, Surgical history, Social history, and Family History were reviewed and updated.  Review of Systems: Review of Systems  Constitutional: Negative.   HENT: Negative.    Eyes: Negative.   Respiratory: Negative.    Cardiovascular: Negative.   Gastrointestinal: Negative.   Genitourinary: Negative.   Musculoskeletal: Negative.   Skin: Negative.   Neurological: Negative.   Endo/Heme/Allergies: Negative.   Psychiatric/Behavioral: Negative.       Physical Exam:  height is '5\' 3"'$  (1.6 m) and weight is 173 lb 3.2 oz (78.6 kg). Jessica [name] oral temperature is 97.9 F  (36.6 C). Jessica [name] blood pressure is 124/59 (abnormal) and Jessica [name] pulse is 71. Jessica [name] respiration is 18 and oxygen saturation is 98%.   Wt Readings from Last 3 Encounters:  11/27/22 173 lb 3.2 oz (78.6 kg)  10/05/22 171 lb (77.6 kg)  07/07/22 170 lb (77.1 kg)  Vital signs were taken.  Temperature 98.4.  Pulse 96.  Blood pressure 125/70.  Weight is 175 pounds.  Physical Exam Vitals reviewed.  Constitutional:      Comments: Jessica [name] breast exam shows right breast with no masses, edema or erythema.  There is no right axillary adenopathy.  Left breast shows the lumpectomy that is well-healed.  She has some contraction of the left breast.  There are some firmness at the lumpectomy site secondary to prior infection that required secondary intention.  There is no distinct mass in the left breast.  There is no left axillary adenopathy.  There is some telangiectasias on the left breast from radiation.  HENT:     Head: Normocephalic and atraumatic.  Eyes:     Pupils: Pupils are equal, round, and reactive to light.  Cardiovascular:     Rate and Rhythm: Normal rate and regular rhythm.     Heart sounds: Normal heart sounds.  Pulmonary:     Effort: Pulmonary effort is normal.     Breath sounds: Normal breath sounds.  Abdominal:     General: Bowel sounds are normal.     Palpations: Abdomen is soft.  Genitourinary:    Comments: Rectal exam shows some small external hemorrhoids.  She had no mass in the rectal vault.  Jessica [name] stool was dark brown and heme positive. Musculoskeletal:        General: No tenderness or deformity. Normal range of motion.     Cervical back: Normal range of motion.  Lymphadenopathy:     Cervical: No cervical adenopathy.  Skin:    General: Skin is warm and dry.     Findings: No erythema or rash.  Neurological:     Mental Status: She is alert and oriented to person, place, and time.     Comments: Neurologically, there is no focal deficits.  She has a very flat affect.  She has a sort of  monotone type voice.  Psychiatric:  Behavior: Behavior normal.        Thought Content: Thought content normal.        Judgment: Judgment normal.      Lab Results  Component Value Date   WBC 5.6 11/27/2022   HGB 13.5 11/27/2022   HCT 40.5 11/27/2022   MCV 100.2 (H) 11/27/2022   PLT 245 11/27/2022   Lab Results  Component Value Date   FERRITIN 7 (L) 05/28/2022   IRON 18 (L) 05/28/2022   TIBC 441 05/28/2022   UIBC 423 05/28/2022   IRONPCTSAT 4 (L) 05/28/2022   Lab Results  Component Value Date   RETICCTPCT 2.1 05/29/2022   RBC 4.04 11/27/2022   No results found for: "KPAFRELGTCHN", "LAMBDASER", "KAPLAMBRATIO" No results found for: "IGGSERUM", "IGA", "IGMSERUM" No results found for: "TOTALPROTELP", "ALBUMINELP", "A1GS", "A2GS", "BETS", "BETA2SER", "GAMS", "MSPIKE", "SPEI"   Chemistry      Component Value Date/Time   NA 139 11/27/2022 1322   NA 139 04/13/2022 1332   NA 143 10/08/2017 1042   NA 141 06/16/2017 1014   K 3.1 (L) 11/27/2022 1322   K 3.4 10/08/2017 1042   K 3.8 06/16/2017 1014   CL 98 11/27/2022 1322   CL 100 10/08/2017 1042   CO2 33 (H) 11/27/2022 1322   CO2 30 10/08/2017 1042   CO2 31 (H) 06/16/2017 1014   BUN 15 11/27/2022 1322   BUN 14 04/13/2022 1332   BUN 10 10/08/2017 1042   BUN 8.9 06/16/2017 1014   CREATININE 0.82 11/27/2022 1322   CREATININE 0.9 10/08/2017 1042   CREATININE 0.8 06/16/2017 1014      Component Value Date/Time   CALCIUM 9.9 11/27/2022 1322   CALCIUM 9.5 10/08/2017 1042   CALCIUM 10.1 06/16/2017 1014   ALKPHOS 60 11/27/2022 1322   ALKPHOS 68 10/08/2017 1042   ALKPHOS 64 06/16/2017 1014   AST 15 11/27/2022 1322   AST 21 06/16/2017 1014   ALT 16 11/27/2022 1322   ALT 20 10/08/2017 1042   ALT 20 06/16/2017 1014   BILITOT 0.4 11/27/2022 1322   BILITOT 0.37 06/16/2017 1014      Impression and Plan: Ms. Roll is a very pleasant 82 yo caucasian female with stage IIA ductal carcinoma of the left breast.  Jessica [name] breast  cancer is TRIPLE NEGATIVE.    She had Jessica [name] partial mastectomy in May 2018 followed by chemotherapy and radiation.  She completed chemotherapy in November 2018.  She then had radiation and completed radiation in January 2019.  I just hate that she seems to be somewhat reserved now.  I really think that it would help for Jessica [name] to be in Assisted Living and be with other people who are close to Jessica [name] age who she can have some common threads with.  Hopefully, she will think about this and agree to it.  I know Jessica [name] family is doing a great job with Jessica [name].  We will go ahead and plan to get Jessica [name] back in 6 more months.  She still has a Port-A-Cath and so we have to flushes every 2 months.  [name], MD 1/26/20242:02 PM

## 2022-11-27 NOTE — Patient Instructions (Signed)

## 2022-12-01 DIAGNOSIS — R1310 Dysphagia, unspecified: Secondary | ICD-10-CM | POA: Diagnosis not present

## 2022-12-01 DIAGNOSIS — I6381 Other cerebral infarction due to occlusion or stenosis of small artery: Secondary | ICD-10-CM | POA: Diagnosis not present

## 2022-12-04 DIAGNOSIS — Z23 Encounter for immunization: Secondary | ICD-10-CM | POA: Diagnosis not present

## 2022-12-07 DIAGNOSIS — N3020 Other chronic cystitis without hematuria: Secondary | ICD-10-CM | POA: Diagnosis not present

## 2022-12-22 DIAGNOSIS — M6281 Muscle weakness (generalized): Secondary | ICD-10-CM | POA: Diagnosis not present

## 2022-12-22 DIAGNOSIS — R2681 Unsteadiness on feet: Secondary | ICD-10-CM | POA: Diagnosis not present

## 2022-12-28 DIAGNOSIS — M6281 Muscle weakness (generalized): Secondary | ICD-10-CM | POA: Diagnosis not present

## 2022-12-28 DIAGNOSIS — R2681 Unsteadiness on feet: Secondary | ICD-10-CM | POA: Diagnosis not present

## 2022-12-29 DIAGNOSIS — B372 Candidiasis of skin and nail: Secondary | ICD-10-CM | POA: Diagnosis not present

## 2022-12-29 DIAGNOSIS — R600 Localized edema: Secondary | ICD-10-CM | POA: Diagnosis not present

## 2022-12-29 DIAGNOSIS — R058 Other specified cough: Secondary | ICD-10-CM | POA: Diagnosis not present

## 2022-12-31 DIAGNOSIS — I6381 Other cerebral infarction due to occlusion or stenosis of small artery: Secondary | ICD-10-CM | POA: Diagnosis not present

## 2022-12-31 DIAGNOSIS — R1310 Dysphagia, unspecified: Secondary | ICD-10-CM | POA: Diagnosis not present

## 2023-01-01 DIAGNOSIS — M6281 Muscle weakness (generalized): Secondary | ICD-10-CM | POA: Diagnosis not present

## 2023-01-01 DIAGNOSIS — R2681 Unsteadiness on feet: Secondary | ICD-10-CM | POA: Diagnosis not present

## 2023-01-04 DIAGNOSIS — R2681 Unsteadiness on feet: Secondary | ICD-10-CM | POA: Diagnosis not present

## 2023-01-04 DIAGNOSIS — M6281 Muscle weakness (generalized): Secondary | ICD-10-CM | POA: Diagnosis not present

## 2023-01-04 DIAGNOSIS — M25512 Pain in left shoulder: Secondary | ICD-10-CM | POA: Diagnosis not present

## 2023-01-05 DIAGNOSIS — M25512 Pain in left shoulder: Secondary | ICD-10-CM | POA: Diagnosis not present

## 2023-01-05 DIAGNOSIS — M6281 Muscle weakness (generalized): Secondary | ICD-10-CM | POA: Diagnosis not present

## 2023-01-05 DIAGNOSIS — R2681 Unsteadiness on feet: Secondary | ICD-10-CM | POA: Diagnosis not present

## 2023-01-06 DIAGNOSIS — R2681 Unsteadiness on feet: Secondary | ICD-10-CM | POA: Diagnosis not present

## 2023-01-06 DIAGNOSIS — M6281 Muscle weakness (generalized): Secondary | ICD-10-CM | POA: Diagnosis not present

## 2023-01-08 DIAGNOSIS — M6281 Muscle weakness (generalized): Secondary | ICD-10-CM | POA: Diagnosis not present

## 2023-01-08 DIAGNOSIS — R2681 Unsteadiness on feet: Secondary | ICD-10-CM | POA: Diagnosis not present

## 2023-01-11 DIAGNOSIS — M6281 Muscle weakness (generalized): Secondary | ICD-10-CM | POA: Diagnosis not present

## 2023-01-11 DIAGNOSIS — R2681 Unsteadiness on feet: Secondary | ICD-10-CM | POA: Diagnosis not present

## 2023-01-11 DIAGNOSIS — M25512 Pain in left shoulder: Secondary | ICD-10-CM | POA: Diagnosis not present

## 2023-01-12 ENCOUNTER — Encounter
Payer: Medicare Other | Attending: Physical Medicine and Rehabilitation | Admitting: Physical Medicine and Rehabilitation

## 2023-01-12 ENCOUNTER — Encounter: Payer: Self-pay | Admitting: Physical Medicine and Rehabilitation

## 2023-01-12 VITALS — BP 132/79 | HR 71 | Ht 63.0 in | Wt 178.0 lb

## 2023-01-12 DIAGNOSIS — R600 Localized edema: Secondary | ICD-10-CM | POA: Diagnosis not present

## 2023-01-12 DIAGNOSIS — M25362 Other instability, left knee: Secondary | ICD-10-CM | POA: Insufficient documentation

## 2023-01-12 DIAGNOSIS — I10 Essential (primary) hypertension: Secondary | ICD-10-CM | POA: Diagnosis not present

## 2023-01-12 DIAGNOSIS — M25562 Pain in left knee: Secondary | ICD-10-CM | POA: Insufficient documentation

## 2023-01-12 NOTE — Patient Instructions (Signed)
Functional medicine

## 2023-01-12 NOTE — Progress Notes (Signed)
Subjective:    Patient ID: Jessica [name], female    DOB: 01-11-1941, 82 y.o.   MRN: [id number]  HPI Hospital HPI Brief HPI:   Jessica [name] is a 82 y.o. right-handed female with history of hypertension hyperlipidemia hypothyroidism asthma depression/anxiety left breast cancer status postmastectomy 2018 as well as radiation therapy, obesity with BMI 31.89.  Per chart review lives alone.  Independent prior to admission.  Presented 03/20/2022 with right-sided weakness and slurred speech with altered mental status and facial droop of acute onset.  CT/MRI showed small focus of acute ischemia within the left caudate body.  No hemorrhage or mass effect.  Old right basal ganglia small vessel infarct and findings of chronic microvascular disease.  Patient did not receive tPA.  CT angiogram head and neck no emergent large vessel occlusion.  Echocardiogram ejection fraction of 60 to 65% no wall motion abnormalities.  Admission chemistries unremarkable except potassium 3.3, urine drug screen negative.  Presently maintained on aspirin and Plavix for CVA prophylaxis x3 months then aspirin alone.  Lovenox for DVT prophylaxis.  Therapy evaluations completed due to patient decreased functional mobility was admitted for a comprehensive rehab program.     Hospital Course: [name] was admitted to rehab 03/23/2022 for inpatient therapies to consist of PT, ST and OT at least three hours five days a week. Past admission physiatrist, therapy team and rehab RN have worked together to provide customized collaborative inpatient rehab.  Pertaining to patient's left caudate body infarction remains stable she will continue on aspirin and Plavix x3 months then aspirin alone.  Mood stabilization with the use of Wellbutrin Effexor as well as Remeron with emotional support provided.  Maintain on Lovenox for DVT prophylaxis no bleeding episodes.  Blood pressure controlled on Norvasc's as well as HCTZ and would need  outpatient follow-up.  Synthroid for hypothyroidism.  Lipitor ongoing for hyperlipidemia.  Bouts of constipation resolved with laxative assistance.  Patient did have a history of breast cancer with mastectomy radiation therapy 2018 follow-up outpatient.  Noted obesity with BMI 31.89 dietary follow-up.  There was some question of OSA ambulatory referral obtained with pulmonary services to establish and evaluate.     Interval history 82 year old female with recent rehabilitation stay for acute ischemic infarct left caudate body.  Patient reports she has been seen by PT and OT at home and she has SLP scheduled.  Overall she has been doing well since her discharge from CIR.  She is here with her daughter.  She feels that her strength and speech has improved since her discharge.  She continues to have some left shoulder pain that is chronic since before her injury, Voltaren is helping to keep this controlled.  Her blood pressure was elevated in clinic today but family reports it was well controlled at home.  They say she has whitecoat hypertension.  She denies depression.  She is not able to provide detailed history, mostly answering in one-word answers.  She reports she has most of her medications available besides the modafinil, there was not approved by insurance.  Getting around the walked [place] in September, no new falls Getting PT every day Daughter sees that she is aggravated with family members Cannot hear mom from a different room Daughter feels she would benefit from therapy Has moved to [place] and she likes it there No pain.  Left knee pain Leff arm strength improving    Pain Inventory Average Pain 0 Pain Right Now 0 My pain  is  no pain    BOWEL Number of stools per week: 1 Stool Softener taken    BLADDER Pads    Mobility use a walker ability to climb steps?  no do you drive?  no Do you have any goals in this area?  yes  Function retired I need assistance with  the following:  feeding, dressing, bathing, toileting, meal prep, household duties, shopping, and lives in an assistance living facility.  Neuro/Psych bladder control problems trouble walking  Prior Studies Any changes since last visit?  no  Physicians involved in your care Any changes since last visit?  no   Family History  Problem Relation Age of Onset   Heart disease Mother        Angina   Heart disease Father    Cervical cancer Maternal Aunt    Heart attack Brother        Died of MI in his mid 5s   Prostate cancer Brother        dx in his early 58s   Breast cancer Paternal Aunt        dx over 35   Bone cancer Cousin        maternal first cousin dx in her 68s-60s   Lung cancer Cousin        paternal first cousin   Colon cancer Neg Hx    Pancreatic cancer Neg Hx    Stomach cancer Neg Hx    Esophageal cancer Neg Hx    Rectal cancer Neg Hx    Social History   Socioeconomic History   Marital status: Married    Spouse name: Not on file   Number of children: 3   Years of education: Not on file   Highest education level: Not on file  Occupational History   Occupation: retired   Tobacco Use   Smoking status: Never    Passive exposure: Past   Smokeless tobacco: Never  Vaping Use   Vaping Use: Never used  Substance and Sexual Activity   Alcohol use: No   Drug use: No   Sexual activity: Not on file  Other Topics Concern   Not on file  Social History Narrative   Married mother of 3 with one grandchild. Never smoked. Does not drink alcohol.   Walks 3-4 days a week for roughly 20-30 minutes.   Social Determinants of Health   Financial Resource Strain: Not on file  Food Insecurity: Not on file  Transportation Needs: Not on file  Physical Activity: Not on file  Stress: Not on file  Social Connections: Not on file   Past Surgical History:  Procedure Laterality Date   ABDOMINAL HYSTERECTOMY     total   BREAST BIOPSY Left 02/24/2017    malignant   BREAST  LUMPECTOMY Left 03/23/2017   broken fingers     CATARACT EXTRACTION Bilateral    COLONOSCOPY     COLONOSCOPY WITH PROPOFOL N/A 06/03/2022   Procedure: COLONOSCOPY WITH PROPOFOL;  Surgeon: [name], MD;  Location: [facility];  Service: Gastroenterology;  Laterality: N/A;   ESOPHAGOGASTRODUODENOSCOPY (EGD) WITH PROPOFOL N/A 05/29/2022   Procedure: ESOPHAGOGASTRODUODENOSCOPY (EGD) WITH PROPOFOL;  Surgeon: Jerene Bears, MD;  Location: [facility];  Service: Gastroenterology;  Laterality: N/A;   EXCISIONAL HEMORRHOIDECTOMY     IR FLUORO GUIDE PORT INSERTION RIGHT  08/04/2017   IR REMOVAL TUN ACCESS W/ PORT W/O FL MOD SED  08/04/2017   IR US GUIDE VASC ACCESS RIGHT  08/04/2017  IRRIGATION AND DEBRIDEMENT ABSCESS Left 05/18/2017   Procedure: IRRIGATION AND DEBRIDEMENT LEFT AXILLARY ABSCESS;  Surgeon: Michael Boston, MD;  Location: [facility];  Service: General;  Laterality: Left;   kidney stones lithotripsy     POLYPECTOMY  06/03/2022   Procedure: POLYPECTOMY;  Surgeon: [name], MD;  Location: [facility];  Service: Gastroenterology;;   Sol Passer PLACEMENT Right 03/23/2017   Procedure: INSERTION PORT-A-CATH;  Surgeon: Erroll Luna, MD;  Location: [place];  Service: General;  Laterality: Right;   RADIOACTIVE SEED GUIDED PARTIAL MASTECTOMY WITH AXILLARY SENTINEL LYMPH NODE BIOPSY Left 03/23/2017   Procedure: LEFT BREAST RADIOACTIVE SEED GUIDED PARTIAL MASTECTOMY AND  SENTINEL LYMPH NODE [place];  Surgeon: Erroll Luna, MD;  Location: [place];  Service: General;  Laterality: Left;   ROTATOR CUFF REPAIR     left    TONSILLECTOMY     WISDOM TOOTH EXTRACTION     Past Medical History:  Diagnosis Date   Allergy    Anemia    Anxiety    Asthma    in past, no inhalers now   Blood transfusion without reported diagnosis    Breast cancer ([place]) 01/2017   left breast    Breast cancer of upper-inner quadrant of left female breast ([place]) 03/11/2017    Cancer ([place]) 01/2017   left breast   Cataract    bilateral removed   Chronic kidney disease    kidney stones   Depression 08/31/2013   Dyslipidemia, goal LDL below 160 08/31/2013   Essential hypertension - well controlled 08/31/2013   Family history of breast cancer    Family history of melanoma    Family history of prostate cancer    GERD (gastroesophageal reflux disease)    Goals of care, counseling/discussion 03/11/2017   History of radiation therapy 10/13/17-11/11/17   left breast 40.05 Gy in 15 fractions, left breast boost 10 Gy in 5 fractions   Hypothyroidism    Ischemic stroke [facility])    May 2023;   Obesity (BMI 30-39.9) 08/31/2013   Personal history of chemotherapy 2018   Personal history of radiation therapy 2019   Thyroid disease    Vasovagal near-syncope -rare 08/31/2013   BP 132/79   Pulse 71   Ht '5\' 3"'$  (1.6 m)   Wt 178 lb (80.7 kg)   SpO2 100%   BMI 31.53 kg/m   Opioid Risk Score:   Fall Risk Score:  `1  Depression screen PHQ 2/9     01/12/2023    2:11 PM 10/05/2022    2:09 PM 04/09/2022    1:51 PM 12/20/2017    3:48 PM 09/27/2017    3:22 PM 04/07/2017   10:06 AM  Depression screen PHQ 2/9  Decreased Interest 0 1 1 0 0 0  Down, Depressed, Hopeless 0 1 0 0 0 0  PHQ - 2 Score 0 2 1 0 0 0  Altered sleeping   0     Tired, decreased energy   2     Change in appetite   0     Feeling bad or failure about yourself    0     Trouble concentrating   0     Moving slowly or fidgety/restless   2     Suicidal thoughts   0     PHQ-9 Score   5     Difficult doing work/chores   Not difficult at all        Review of Systems  Constitutional: Negative.   HENT: Negative.    Eyes: Negative.   Respiratory: Negative.    Cardiovascular: Negative.   Gastrointestinal: Negative.   Endocrine: Negative.   Genitourinary:  Positive for difficulty urinating.  Musculoskeletal:  Positive for gait problem.  Skin: Negative.   Allergic/Immunologic: Negative.   Hematological:  Negative.   Psychiatric/Behavioral: Negative.         Objective:   Physical Exam    Vitals:   04/09/22 1340  BP: (!) 158/81  Pulse: 71  SpO2: 92%   Gen: no distress, normal appearing HEENT: oral mucosa pink and moist, NCAT Cardio: Reg rate Chest: normal effort, normal rate of breathing Abd: soft, non-distended Ext: no edema Psych: pleasant, normal affect Skin: intact Neuro: She is alert and oriented to person place time and situation, cranial nerves II through XII intact other than right facial weakness facial weakness and dysarthria, sensation intact to light touch in all 4 extremities, she is able to repeat 3 words correctly, she is able to name 3 objects correctly, deep tendon reflexes decreased and symmetric throughout Musculoskeletal: Normal resting tone Strength 4 out of 5 throughout right upper extremity, 4- out of 5 left shoulder abduction due to chronic rotator cuff injury, otherwise left upper extremity 4+ out of 5 Right lower extremity 4 - hip flexion, otherwise 4 out of 5 Left lower extremity 4- out of 5 hip flexion, otherwise 4+ out of 5 L shoulder pain- using voltaren, chronic injury Walking with walker, step through, decreased step length Left shoulder pain with abduction, chronic     Assessment & Plan:    1. Functional deficits secondary to acute ischemic infarct left caudate body.             -Placed new orders for PT OT SLP  -Aspirin 81 mg daily and Plavix 75 mg day x3 months then aspirin alone as noted by neurology services Dr.Palikh  -Continue PT, OT  2. Shoulder pain -Continue Tylenol and Voltaren as needed  3.  Hypertension.  Hydrochlorothiazide 12.5 mg daily  -continue losartan -She reports her blood pressure is well controlled at home, advise follow-up with primary care physician  4.  GERD.  Protonix, she can also switch back her home Prilosec  5. Hypokalemia: She was discharged on potassium supplement -After visit I noted plans to have BMP  completed to f/u on this, I called and advised to have this completed, I ordered BMP lab   6.  Daytime somnolence: discussed amantadine, methylphenidate -discussed that modafinil was rejected.   7. OSA -Provided counseling regarding benefits of OSA treatment  8) Constipation:  -Provided list of following foods that help with constipation and highlighted a few: 1) prunes- contain high amounts of fiber.  2) apples- has a form of dietary fiber called pectin that accelerates stool movement and increases beneficial gut bacteria 3) pears- in addition to fiber, also high in fructose and sorbitol which have laxative effect 4) figs- contain an enzyme ficin which helps to speed colonic transit 5) kiwis- contain an enzyme actinidin that improves gut motility and reduces constipation 6) oranges- rich in pectin (like apples) 7) grapefruits- contain a flavanol naringenin which has a laxative effect 8) vegetables- rich in fiber and also great sources of folate, vitamin C, and K 9) artichoke- high in inulin, prebiotic great for the microbiome 10) chicory- increases stool frequency and softness (can be added to coffee) 11) rhubarb- laxative effect 12) sweet potato- high fiber 13) beans, peas, and lentils- contain both soluble  and insoluble fiber 14) chia seeds- improves intestinal health and gut flora 15) flaxseeds- laxative effect 16) whole grain rye bread- high in fiber 17) oat bran- high in soluble and insoluble fiber 18) kefir- softens stools -recommended to try at least one of these foods every day.  -drink 6-8 glasses of water per day -walk regularly, especially after meals.   9) Left knee pain: Prescribed Zynex Nexwave and heating/cooling blanket     10) Left knee buckling:  -discussed that this is secondary to quadriceps weakness Prescribed Zynex brace

## 2023-01-13 ENCOUNTER — Inpatient Hospital Stay: Payer: Medicare Other | Attending: Hematology & Oncology

## 2023-01-13 VITALS — BP 125/61 | HR 66 | Temp 98.5°F | Resp 17

## 2023-01-13 DIAGNOSIS — C50912 Malignant neoplasm of unspecified site of left female breast: Secondary | ICD-10-CM | POA: Diagnosis not present

## 2023-01-13 DIAGNOSIS — Z95828 Presence of other vascular implants and grafts: Secondary | ICD-10-CM

## 2023-01-13 DIAGNOSIS — M6281 Muscle weakness (generalized): Secondary | ICD-10-CM | POA: Diagnosis not present

## 2023-01-13 DIAGNOSIS — Z171 Estrogen receptor negative status [ER-]: Secondary | ICD-10-CM | POA: Diagnosis not present

## 2023-01-13 DIAGNOSIS — R2681 Unsteadiness on feet: Secondary | ICD-10-CM | POA: Diagnosis not present

## 2023-01-13 MED ORDER — SODIUM CHLORIDE 0.9% FLUSH
10.0000 mL | Freq: Once | INTRAVENOUS | Status: AC
  Administered 2023-01-13: 10 mL via INTRAVENOUS

## 2023-01-13 MED ORDER — HEPARIN SOD (PORK) LOCK FLUSH 100 UNIT/ML IV SOLN
500.0000 [IU] | Freq: Once | INTRAVENOUS | Status: AC
  Administered 2023-01-13: 500 [IU] via INTRAVENOUS

## 2023-01-13 NOTE — Patient Instructions (Signed)

## 2023-01-14 DIAGNOSIS — M6281 Muscle weakness (generalized): Secondary | ICD-10-CM | POA: Diagnosis not present

## 2023-01-14 DIAGNOSIS — R2681 Unsteadiness on feet: Secondary | ICD-10-CM | POA: Diagnosis not present

## 2023-01-14 DIAGNOSIS — M25512 Pain in left shoulder: Secondary | ICD-10-CM | POA: Diagnosis not present

## 2023-01-15 DIAGNOSIS — M6281 Muscle weakness (generalized): Secondary | ICD-10-CM | POA: Diagnosis not present

## 2023-01-15 DIAGNOSIS — R2681 Unsteadiness on feet: Secondary | ICD-10-CM | POA: Diagnosis not present

## 2023-01-18 DIAGNOSIS — M6281 Muscle weakness (generalized): Secondary | ICD-10-CM | POA: Diagnosis not present

## 2023-01-18 DIAGNOSIS — R2681 Unsteadiness on feet: Secondary | ICD-10-CM | POA: Diagnosis not present

## 2023-01-18 DIAGNOSIS — M25512 Pain in left shoulder: Secondary | ICD-10-CM | POA: Diagnosis not present

## 2023-01-19 DIAGNOSIS — M6281 Muscle weakness (generalized): Secondary | ICD-10-CM | POA: Diagnosis not present

## 2023-01-19 DIAGNOSIS — R2681 Unsteadiness on feet: Secondary | ICD-10-CM | POA: Diagnosis not present

## 2023-01-21 DIAGNOSIS — M6281 Muscle weakness (generalized): Secondary | ICD-10-CM | POA: Diagnosis not present

## 2023-01-21 DIAGNOSIS — M25512 Pain in left shoulder: Secondary | ICD-10-CM | POA: Diagnosis not present

## 2023-01-21 DIAGNOSIS — R2681 Unsteadiness on feet: Secondary | ICD-10-CM | POA: Diagnosis not present

## 2023-01-22 DIAGNOSIS — M6281 Muscle weakness (generalized): Secondary | ICD-10-CM | POA: Diagnosis not present

## 2023-01-22 DIAGNOSIS — R2681 Unsteadiness on feet: Secondary | ICD-10-CM | POA: Diagnosis not present

## 2023-01-25 DIAGNOSIS — M6281 Muscle weakness (generalized): Secondary | ICD-10-CM | POA: Diagnosis not present

## 2023-01-25 DIAGNOSIS — R2681 Unsteadiness on feet: Secondary | ICD-10-CM | POA: Diagnosis not present

## 2023-01-26 DIAGNOSIS — M25512 Pain in left shoulder: Secondary | ICD-10-CM | POA: Diagnosis not present

## 2023-01-26 DIAGNOSIS — R2681 Unsteadiness on feet: Secondary | ICD-10-CM | POA: Diagnosis not present

## 2023-01-26 DIAGNOSIS — M6281 Muscle weakness (generalized): Secondary | ICD-10-CM | POA: Diagnosis not present

## 2023-01-27 DIAGNOSIS — M6281 Muscle weakness (generalized): Secondary | ICD-10-CM | POA: Diagnosis not present

## 2023-01-27 DIAGNOSIS — R2681 Unsteadiness on feet: Secondary | ICD-10-CM | POA: Diagnosis not present

## 2023-01-28 DIAGNOSIS — M25512 Pain in left shoulder: Secondary | ICD-10-CM | POA: Diagnosis not present

## 2023-01-28 DIAGNOSIS — M6281 Muscle weakness (generalized): Secondary | ICD-10-CM | POA: Diagnosis not present

## 2023-01-28 DIAGNOSIS — R2681 Unsteadiness on feet: Secondary | ICD-10-CM | POA: Diagnosis not present

## 2023-01-29 DIAGNOSIS — M6281 Muscle weakness (generalized): Secondary | ICD-10-CM | POA: Diagnosis not present

## 2023-01-29 DIAGNOSIS — R2681 Unsteadiness on feet: Secondary | ICD-10-CM | POA: Diagnosis not present

## 2023-02-01 DIAGNOSIS — R2681 Unsteadiness on feet: Secondary | ICD-10-CM | POA: Diagnosis not present

## 2023-02-01 DIAGNOSIS — E7800 Pure hypercholesterolemia, unspecified: Secondary | ICD-10-CM | POA: Diagnosis not present

## 2023-02-01 DIAGNOSIS — R600 Localized edema: Secondary | ICD-10-CM | POA: Diagnosis not present

## 2023-02-01 DIAGNOSIS — E559 Vitamin D deficiency, unspecified: Secondary | ICD-10-CM | POA: Diagnosis not present

## 2023-02-01 DIAGNOSIS — M25512 Pain in left shoulder: Secondary | ICD-10-CM | POA: Diagnosis not present

## 2023-02-01 DIAGNOSIS — E039 Hypothyroidism, unspecified: Secondary | ICD-10-CM | POA: Diagnosis not present

## 2023-02-01 DIAGNOSIS — M6281 Muscle weakness (generalized): Secondary | ICD-10-CM | POA: Diagnosis not present

## 2023-02-01 DIAGNOSIS — N390 Urinary tract infection, site not specified: Secondary | ICD-10-CM | POA: Diagnosis not present

## 2023-02-01 DIAGNOSIS — D508 Other iron deficiency anemias: Secondary | ICD-10-CM | POA: Diagnosis not present

## 2023-02-02 DIAGNOSIS — M6281 Muscle weakness (generalized): Secondary | ICD-10-CM | POA: Diagnosis not present

## 2023-02-02 DIAGNOSIS — R2681 Unsteadiness on feet: Secondary | ICD-10-CM | POA: Diagnosis not present

## 2023-02-03 DIAGNOSIS — M25512 Pain in left shoulder: Secondary | ICD-10-CM | POA: Diagnosis not present

## 2023-02-03 DIAGNOSIS — R2681 Unsteadiness on feet: Secondary | ICD-10-CM | POA: Diagnosis not present

## 2023-02-03 DIAGNOSIS — M6281 Muscle weakness (generalized): Secondary | ICD-10-CM | POA: Diagnosis not present

## 2023-02-05 DIAGNOSIS — M6281 Muscle weakness (generalized): Secondary | ICD-10-CM | POA: Diagnosis not present

## 2023-02-05 DIAGNOSIS — R2681 Unsteadiness on feet: Secondary | ICD-10-CM | POA: Diagnosis not present

## 2023-02-08 DIAGNOSIS — D500 Iron deficiency anemia secondary to blood loss (chronic): Secondary | ICD-10-CM | POA: Diagnosis not present

## 2023-02-08 DIAGNOSIS — Z8719 Personal history of other diseases of the digestive system: Secondary | ICD-10-CM | POA: Diagnosis not present

## 2023-02-08 DIAGNOSIS — N3020 Other chronic cystitis without hematuria: Secondary | ICD-10-CM | POA: Diagnosis not present

## 2023-02-08 DIAGNOSIS — Z0000 Encounter for general adult medical examination without abnormal findings: Secondary | ICD-10-CM | POA: Diagnosis not present

## 2023-02-08 DIAGNOSIS — Z853 Personal history of malignant neoplasm of breast: Secondary | ICD-10-CM | POA: Diagnosis not present

## 2023-02-08 DIAGNOSIS — E559 Vitamin D deficiency, unspecified: Secondary | ICD-10-CM | POA: Diagnosis not present

## 2023-02-08 DIAGNOSIS — E7800 Pure hypercholesterolemia, unspecified: Secondary | ICD-10-CM | POA: Diagnosis not present

## 2023-02-08 DIAGNOSIS — Z7901 Long term (current) use of anticoagulants: Secondary | ICD-10-CM | POA: Diagnosis not present

## 2023-02-08 DIAGNOSIS — K219 Gastro-esophageal reflux disease without esophagitis: Secondary | ICD-10-CM | POA: Diagnosis not present

## 2023-02-09 DIAGNOSIS — M25512 Pain in left shoulder: Secondary | ICD-10-CM | POA: Diagnosis not present

## 2023-02-09 DIAGNOSIS — M6281 Muscle weakness (generalized): Secondary | ICD-10-CM | POA: Diagnosis not present

## 2023-02-09 DIAGNOSIS — R2681 Unsteadiness on feet: Secondary | ICD-10-CM | POA: Diagnosis not present

## 2023-02-15 DIAGNOSIS — M6281 Muscle weakness (generalized): Secondary | ICD-10-CM | POA: Diagnosis not present

## 2023-02-15 DIAGNOSIS — R2681 Unsteadiness on feet: Secondary | ICD-10-CM | POA: Diagnosis not present

## 2023-02-16 DIAGNOSIS — M6281 Muscle weakness (generalized): Secondary | ICD-10-CM | POA: Diagnosis not present

## 2023-02-16 DIAGNOSIS — R2681 Unsteadiness on feet: Secondary | ICD-10-CM | POA: Diagnosis not present

## 2023-02-16 DIAGNOSIS — M25512 Pain in left shoulder: Secondary | ICD-10-CM | POA: Diagnosis not present

## 2023-02-17 DIAGNOSIS — R2681 Unsteadiness on feet: Secondary | ICD-10-CM | POA: Diagnosis not present

## 2023-02-17 DIAGNOSIS — M6281 Muscle weakness (generalized): Secondary | ICD-10-CM | POA: Diagnosis not present

## 2023-02-19 DIAGNOSIS — M6281 Muscle weakness (generalized): Secondary | ICD-10-CM | POA: Diagnosis not present

## 2023-02-19 DIAGNOSIS — R2681 Unsteadiness on feet: Secondary | ICD-10-CM | POA: Diagnosis not present

## 2023-02-21 DIAGNOSIS — R2681 Unsteadiness on feet: Secondary | ICD-10-CM | POA: Diagnosis not present

## 2023-02-21 DIAGNOSIS — M25512 Pain in left shoulder: Secondary | ICD-10-CM | POA: Diagnosis not present

## 2023-02-21 DIAGNOSIS — M6281 Muscle weakness (generalized): Secondary | ICD-10-CM | POA: Diagnosis not present

## 2023-02-22 DIAGNOSIS — M6281 Muscle weakness (generalized): Secondary | ICD-10-CM | POA: Diagnosis not present

## 2023-02-22 DIAGNOSIS — M25512 Pain in left shoulder: Secondary | ICD-10-CM | POA: Diagnosis not present

## 2023-02-22 DIAGNOSIS — R2681 Unsteadiness on feet: Secondary | ICD-10-CM | POA: Diagnosis not present

## 2023-02-23 DIAGNOSIS — R2681 Unsteadiness on feet: Secondary | ICD-10-CM | POA: Diagnosis not present

## 2023-02-23 DIAGNOSIS — M25512 Pain in left shoulder: Secondary | ICD-10-CM | POA: Diagnosis not present

## 2023-02-23 DIAGNOSIS — M6281 Muscle weakness (generalized): Secondary | ICD-10-CM | POA: Diagnosis not present

## 2023-02-24 ENCOUNTER — Inpatient Hospital Stay: Payer: Medicare Other

## 2023-02-24 DIAGNOSIS — R2681 Unsteadiness on feet: Secondary | ICD-10-CM | POA: Diagnosis not present

## 2023-02-24 DIAGNOSIS — M6281 Muscle weakness (generalized): Secondary | ICD-10-CM | POA: Diagnosis not present

## 2023-02-26 DIAGNOSIS — M6281 Muscle weakness (generalized): Secondary | ICD-10-CM | POA: Diagnosis not present

## 2023-02-26 DIAGNOSIS — R2681 Unsteadiness on feet: Secondary | ICD-10-CM | POA: Diagnosis not present

## 2023-03-01 DIAGNOSIS — M25512 Pain in left shoulder: Secondary | ICD-10-CM | POA: Diagnosis not present

## 2023-03-01 DIAGNOSIS — R2681 Unsteadiness on feet: Secondary | ICD-10-CM | POA: Diagnosis not present

## 2023-03-01 DIAGNOSIS — M6281 Muscle weakness (generalized): Secondary | ICD-10-CM | POA: Diagnosis not present

## 2023-03-03 ENCOUNTER — Inpatient Hospital Stay: Payer: Medicare Other | Attending: Hematology & Oncology

## 2023-03-03 VITALS — BP 129/66 | HR 77 | Temp 97.9°F | Resp 19

## 2023-03-03 DIAGNOSIS — C50912 Malignant neoplasm of unspecified site of left female breast: Secondary | ICD-10-CM | POA: Insufficient documentation

## 2023-03-03 DIAGNOSIS — C50212 Malignant neoplasm of upper-inner quadrant of left female breast: Secondary | ICD-10-CM

## 2023-03-03 DIAGNOSIS — M25512 Pain in left shoulder: Secondary | ICD-10-CM | POA: Diagnosis not present

## 2023-03-03 DIAGNOSIS — R2681 Unsteadiness on feet: Secondary | ICD-10-CM | POA: Diagnosis not present

## 2023-03-03 DIAGNOSIS — Z171 Estrogen receptor negative status [ER-]: Secondary | ICD-10-CM | POA: Insufficient documentation

## 2023-03-03 DIAGNOSIS — M6281 Muscle weakness (generalized): Secondary | ICD-10-CM | POA: Diagnosis not present

## 2023-03-03 MED ORDER — SODIUM CHLORIDE 0.9% FLUSH
10.0000 mL | INTRAVENOUS | Status: DC | PRN
  Administered 2023-03-03: 10 mL

## 2023-03-03 MED ORDER — HEPARIN SOD (PORK) LOCK FLUSH 100 UNIT/ML IV SOLN
500.0000 [IU] | Freq: Once | INTRAVENOUS | Status: AC | PRN
  Administered 2023-03-03: 500 [IU]

## 2023-03-03 NOTE — Patient Instructions (Signed)

## 2023-03-09 DIAGNOSIS — M2011 Hallux valgus (acquired), right foot: Secondary | ICD-10-CM | POA: Diagnosis not present

## 2023-03-09 DIAGNOSIS — B351 Tinea unguium: Secondary | ICD-10-CM | POA: Diagnosis not present

## 2023-03-09 DIAGNOSIS — M79674 Pain in right toe(s): Secondary | ICD-10-CM | POA: Diagnosis not present

## 2023-03-31 ENCOUNTER — Inpatient Hospital Stay: Payer: Medicare Other

## 2023-03-31 VITALS — BP 131/48 | HR 69 | Temp 97.9°F | Resp 19

## 2023-03-31 DIAGNOSIS — Z95828 Presence of other vascular implants and grafts: Secondary | ICD-10-CM

## 2023-03-31 DIAGNOSIS — C50912 Malignant neoplasm of unspecified site of left female breast: Secondary | ICD-10-CM | POA: Diagnosis not present

## 2023-03-31 DIAGNOSIS — Z171 Estrogen receptor negative status [ER-]: Secondary | ICD-10-CM | POA: Diagnosis not present

## 2023-03-31 MED ORDER — SODIUM CHLORIDE 0.9% FLUSH
10.0000 mL | Freq: Once | INTRAVENOUS | Status: AC
  Administered 2023-03-31: 10 mL via INTRAVENOUS

## 2023-03-31 MED ORDER — HEPARIN SOD (PORK) LOCK FLUSH 100 UNIT/ML IV SOLN
500.0000 [IU] | Freq: Once | INTRAVENOUS | Status: AC
  Administered 2023-03-31: 500 [IU] via INTRAVENOUS

## 2023-03-31 NOTE — Patient Instructions (Signed)

## 2023-04-02 ENCOUNTER — Ambulatory Visit: Payer: Medicare Other | Admitting: Podiatry

## 2023-04-02 DIAGNOSIS — B351 Tinea unguium: Secondary | ICD-10-CM | POA: Diagnosis not present

## 2023-04-02 MED ORDER — CICLOPIROX 8 % EX SOLN: Freq: Every day | CUTANEOUS | 0 refills | Status: DC

## 2023-04-07 ENCOUNTER — Other Ambulatory Visit: Payer: Self-pay

## 2023-04-07 ENCOUNTER — Emergency Department (HOSPITAL_COMMUNITY): Payer: Medicare Other

## 2023-04-07 ENCOUNTER — Emergency Department (HOSPITAL_COMMUNITY)
Admission: EM | Admit: 2023-04-07 | Discharge: 2023-04-07 | Disposition: A | Discharge status: Home / Self Care | Payer: Medicare Other | Attending: Emergency Medicine | Admitting: Emergency Medicine

## 2023-04-07 DIAGNOSIS — Z853 Personal history of malignant neoplasm of breast: Secondary | ICD-10-CM | POA: Insufficient documentation

## 2023-04-07 DIAGNOSIS — Z7902 Long term (current) use of antithrombotics/antiplatelets: Secondary | ICD-10-CM | POA: Insufficient documentation

## 2023-04-07 DIAGNOSIS — Z7982 Long term (current) use of aspirin: Secondary | ICD-10-CM | POA: Insufficient documentation

## 2023-04-07 DIAGNOSIS — I10 Essential (primary) hypertension: Secondary | ICD-10-CM | POA: Insufficient documentation

## 2023-04-07 DIAGNOSIS — I499 Cardiac arrhythmia, unspecified: Secondary | ICD-10-CM | POA: Diagnosis not present

## 2023-04-07 DIAGNOSIS — R569 Unspecified convulsions: Secondary | ICD-10-CM | POA: Diagnosis not present

## 2023-04-07 DIAGNOSIS — R29810 Facial weakness: Secondary | ICD-10-CM | POA: Diagnosis not present

## 2023-04-07 DIAGNOSIS — Z743 Need for continuous supervision: Secondary | ICD-10-CM | POA: Diagnosis not present

## 2023-04-07 DIAGNOSIS — R4781 Slurred speech: Secondary | ICD-10-CM | POA: Diagnosis not present

## 2023-04-07 DIAGNOSIS — R531 Weakness: Secondary | ICD-10-CM | POA: Diagnosis not present

## 2023-04-07 DIAGNOSIS — R6889 Other general symptoms and signs: Secondary | ICD-10-CM | POA: Diagnosis not present

## 2023-04-07 DIAGNOSIS — R29898 Other symptoms and signs involving the musculoskeletal system: Secondary | ICD-10-CM | POA: Diagnosis not present

## 2023-04-07 LAB — CBC WITH DIFFERENTIAL/PLATELET
Abs Immature Granulocytes: 0.02 10*3/uL (ref 0.00–0.07)
Basophils Absolute: 0 10*3/uL (ref 0.0–0.1)
Basophils Relative: 0 %
Eosinophils Absolute: 0 10*3/uL (ref 0.0–0.5)
Eosinophils Relative: 1 %
HCT: 37.5 % (ref 36.0–46.0)
Hemoglobin: 12.6 g/dL (ref 12.0–15.0)
Immature Granulocytes: 0 %
Lymphocytes Relative: 17 %
Lymphs Abs: 1.1 10*3/uL (ref 0.7–4.0)
MCH: 34.1 pg — ABNORMAL HIGH (ref 26.0–34.0)
MCHC: 33.6 g/dL (ref 30.0–36.0)
MCV: 101.6 fL — ABNORMAL HIGH (ref 80.0–100.0)
Monocytes Absolute: 0.5 10*3/uL (ref 0.1–1.0)
Monocytes Relative: 8 %
Neutro Abs: 4.4 10*3/uL (ref 1.7–7.7)
Neutrophils Relative %: 74 %
Platelets: 200 10*3/uL (ref 150–400)
RBC: 3.69 MIL/uL — ABNORMAL LOW (ref 3.87–5.11)
RDW: 12.4 % (ref 11.5–15.5)
WBC: 6 10*3/uL (ref 4.0–10.5)
nRBC: 0 % (ref 0.0–0.2)

## 2023-04-07 LAB — COMPREHENSIVE METABOLIC PANEL
ALT: 23 U/L (ref 0–44)
AST: 22 U/L (ref 15–41)
Albumin: 3.4 g/dL — ABNORMAL LOW (ref 3.5–5.0)
Alkaline Phosphatase: 54 U/L (ref 38–126)
Anion gap: 13 (ref 5–15)
BUN: 10 mg/dL (ref 8–23)
CO2: 26 mmol/L (ref 22–32)
Calcium: 9.4 mg/dL (ref 8.9–10.3)
Chloride: 97 mmol/L — ABNORMAL LOW (ref 98–111)
Creatinine, Ser: 0.72 mg/dL (ref 0.44–1.00)
GFR, Estimated: >60 mL/min (ref >60)
Glucose, Bld: 102 mg/dL — ABNORMAL HIGH (ref 70–99)
Potassium: 3.7 mmol/L (ref 3.5–5.1)
Sodium: 136 mmol/L (ref 135–145)
Total Bilirubin: 0.6 mg/dL (ref 0.3–1.2)
Total Protein: 6.2 g/dL — ABNORMAL LOW (ref 6.5–8.1)

## 2023-04-07 LAB — MAGNESIUM: Magnesium: 1.8 mg/dL (ref 1.7–2.4)

## 2023-04-07 LAB — URINALYSIS, ROUTINE W REFLEX MICROSCOPIC
Bilirubin Urine: NEGATIVE
Glucose, UA: NEGATIVE mg/dL
Hgb urine dipstick: NEGATIVE
Ketones, ur: NEGATIVE mg/dL
Leukocytes,Ua: NEGATIVE
Nitrite: NEGATIVE
Protein, ur: NEGATIVE mg/dL
Specific Gravity, Urine: 1.009 (ref 1.005–1.030)
pH: 7 (ref 5.0–8.0)

## 2023-04-07 LAB — RAPID URINE DRUG SCREEN, HOSP PERFORMED
Amphetamines: NOT DETECTED
Barbiturates: NOT DETECTED
Benzodiazepines: NOT DETECTED
Cocaine: NOT DETECTED
Opiates: NOT DETECTED
Tetrahydrocannabinol: NOT DETECTED

## 2023-04-07 LAB — ETHANOL: Alcohol, Ethyl (B): <10 mg/dL (ref <10)

## 2023-04-07 LAB — FOLATE: Folate: 18.8 ng/mL (ref >5.9)

## 2023-04-07 LAB — CBG MONITORING, ED: Glucose-Capillary: 94 mg/dL (ref 70–99)

## 2023-04-07 LAB — VITAMIN B12: Vitamin B-12: 349 pg/mL (ref 180–914)

## 2023-04-07 MED ORDER — GADOBUTROL 1 MMOL/ML IV SOLN
8.0000 mL | Freq: Once | INTRAVENOUS | Status: AC | PRN
  Administered 2023-04-07: 8 mL via INTRAVENOUS

## 2023-04-07 MED ORDER — LORAZEPAM 1 MG PO TABS
1.0000 mg | ORAL_TABLET | ORAL | Status: AC
  Administered 2023-04-07: 1 mg via ORAL
  Filled 2023-04-07: qty 1

## 2023-04-07 NOTE — ED Provider Notes (Signed)
[place] EMERGENCY DEPARTMENT AT [facility] Provider Note   CSN: [id number] Arrival date & time: 04/07/23  4098     History {Add pertinent medical, surgical, social history, OB history to HPI:1} Chief Complaint  Patient presents with   Transient Ischemic Attack    Jessica [name] is a 82 y.o. female.  82 year old female with a history of bilateral basal ganglia stroke on aspirin and Plavix, triple negative breast cancer being observed currently, hypertension, and hyperlipidemia who presents to the emergency department with left-sided twitching and weakness that lasted for several minutes.  Earlier today patient was returning from the restroom and said that she felt dizzy and sat down.  Staff noticed that her left arm and left leg started twitching.  Says that she was aware during this entire event.  When EMS arrived the twitching had stopped but she was weak on her left side including her face, arm, and leg.  Symptoms resolved after about 15 minutes.  No history of seizures.  No headache.  Denies any additional symptoms aside from some mild constipation.  No drug use or EtOH use.       Home Medications Prior to Admission medications   Medication Sig Start Date End Date Taking? Authorizing Provider  acetaminophen (TYLENOL) 325 MG tablet Take 2 tablets (650 mg total) by mouth every 6 (six) hours as needed for mild pain (or Fever >/= 101). 03/30/22   Angiulli, Mcarthur Rossetti, PA-C  ascorbic acid (VITAMIN C) 500 MG tablet Take 1 tablet (500 mg total) by mouth daily. 06/05/22   Drema Dallas, MD  aspirin 81 MG chewable tablet Chew 1 tablet (81 mg total) by mouth daily. 03/24/22   Danford, Earl Lites, MD  atorvastatin (LIPITOR) 80 MG tablet Take 1 tablet (80 mg total) by mouth at bedtime. Patient taking differently: Take 80 mg by mouth daily. 03/30/22   Angiulli, Mcarthur Rossetti, PA-C  buPROPion (WELLBUTRIN XL) 150 MG 24 hr tablet Take 1 tablet (150 mg total) by mouth every  morning. Patient taking differently: Take 150 mg by mouth daily. 03/30/22   Angiulli, Mcarthur Rossetti, PA-C  cephALEXin (KEFLEX) 250 MG capsule Take 250 mg by mouth daily. 06/17/22   [provider]  cholecalciferol (VITAMIN D3) 25 MCG (1000 UNIT) tablet Take 1 tablet (1,000 Units total) by mouth daily. 03/30/22   Angiulli, Mcarthur Rossetti, PA-C  ciclopirox (PENLAC) 8 % solution Apply topically at bedtime. Apply over nail and surrounding skin. Apply daily over previous coat. After seven (7) days, may remove with alcohol and continue cycle. 04/02/23   Vivi Barrack, DPM  diclofenac Sodium (VOLTAREN) 1 % GEL Apply 2 g topically 4 (four) times daily as needed (shoulder pain). 03/30/22   Angiulli, Mcarthur Rossetti, PA-C  docusate sodium (COLACE) 100 MG capsule Take 1 capsule (100 mg total) by mouth 2 (two) times daily. 06/13/22 06/13/23  Dorcas Carrow, MD  estradiol (ESTRACE) 0.1 MG/GM vaginal cream Place vaginally. 09/24/22   [provider]  ferrous sulfate 325 (65 FE) MG tablet Take 1 tablet (325 mg total) by mouth daily with breakfast. 06/05/22   Drema Dallas, MD  folic acid (FOLVITE) 1 MG tablet Take 1 mg by mouth 2 (two) times daily. 09/16/22   [provider]  hydrochlorothiazide (HYDRODIURIL) 25 MG tablet Take 25 mg by mouth daily.    [provider]  hydrochlorothiazide (MICROZIDE) 12.5 MG capsule Take 12.5 mg by mouth daily. Patient not taking: Reported on 01/12/2023 08/28/22   [provider]  hydrocortisone (ANUSOL-HC) 2.5 % rectal cream Apply 1 Application topically 2 (two) times daily. 06/17/22   [provider]  lactulose (CHRONULAC) 10 GM/15ML solution Take 45 mLs (30 g total) by mouth 2 (two) times daily as needed for mild constipation or moderate constipation. 06/12/22   Dorcas Carrow, MD  levothyroxine (SYNTHROID) 75 MCG tablet Take 1 tablet (75 mcg total) by mouth daily. 03/30/22   Angiulli, Mcarthur Rossetti, PA-C  losartan (COZAAR) 50 MG tablet Take 50 mg by  mouth daily.    [provider]  Multiple Vitamins-Minerals (OCUVITE ADULT FORMULA PO) Take 1 tablet by mouth daily.    [provider]  pantoprazole (PROTONIX) 40 MG tablet Take 1 tablet (40 mg total) by mouth 2 (two) times daily. 06/05/22   Drema Dallas, MD  potassium chloride (KLOR-CON) 10 MEQ tablet Take by mouth. 09/14/22   [provider]  venlafaxine XR (EFFEXOR-XR) 75 MG 24 hr capsule Take 3 capsules (225 mg total) by mouth daily. 03/30/22   Angiulli, Mcarthur Rossetti, PA-C  Vitamin D, Ergocalciferol, (DRISDOL) 1.25 MG (50000 UNIT) CAPS capsule Take 1 capsule (50,000 Units total) by mouth every 7 (seven) days. 10/05/22   Raulkar, Drema Pry, MD      Allergies    Advil [ibuprofen], Codeine, Crestor [rosuvastatin calcium], Penicillins, Sulfamethoxazole-trimethoprim, Bacitracin-polymyxin b, Benazepril, and Other    Review of Systems   Review of Systems  Physical Exam Updated Vital Signs BP (!) 147/61   Pulse (!) 50   Resp 12   Ht 5\' 3"  (1.6 m)   Wt 81 kg   SpO2 96%   BMI 31.63 kg/m  Physical Exam Vitals and nursing note reviewed.  Constitutional:      General: She is not in acute distress.    Appearance: She is well-developed.  HENT:     Head: Normocephalic and atraumatic.     Right Ear: External ear normal.     Left Ear: External ear normal.     Nose: Nose normal.  Eyes:     Extraocular Movements: Extraocular movements intact.     Conjunctiva/sclera: Conjunctivae normal.     Pupils: Pupils are equal, round, and reactive to light.  Cardiovascular:     Rate and Rhythm: Regular rhythm. Bradycardia present.  Pulmonary:     Effort: Pulmonary effort is normal. No respiratory distress.  Abdominal:     General: Abdomen is flat. There is no distension.     Palpations: Abdomen is soft. There is no mass.     Tenderness: There is no abdominal tenderness. There is no guarding.  Musculoskeletal:     Cervical back: Normal range of motion and neck supple.      Right lower leg: No edema.     Left lower leg: No edema.  Skin:    General: Skin is warm and dry.  Neurological:     Mental Status: She is alert.     Comments: NIHSS Exam  Level of Consciousness: Alert  LOC Questions: Answers Month and Age Correctly  LOC Commands: Opens and Closes Eyes and Hands on command  Best Gaze: Horizontal ocular movements intact  Visual Fields: No visual field loss  Facial Palsy: None  L Upper Extremity Motor: No drift after 10 seconds  R Upper Extremity Motor: No drift after 10 seconds  L Lower extremity Motor: No drift after 5 seconds  R Lower extremity Motor: No drift after 5 seconds  Ataxia: Absent  Sensory: Intact sensation to pinprick on  face, arms, trunk, and legs bilaterally  Best Language: No aphasia  Dysarthria: No dysarthria  Neglect: No visual or sensory neglect    Psychiatric:        Mood and Affect: Mood normal.     ED Results / Procedures / Treatments   Labs (all labs ordered are listed, but only abnormal results are displayed) Labs Reviewed  COMPREHENSIVE METABOLIC PANEL  CBC WITH DIFFERENTIAL/PLATELET  MAGNESIUM  URINALYSIS, ROUTINE W REFLEX MICROSCOPIC  RAPID URINE DRUG SCREEN, HOSP PERFORMED  ETHANOL  CBG MONITORING, ED    EKG None  Radiology No results found.  Procedures Procedures  {Document cardiac monitor, telemetry assessment procedure when appropriate:1}  Medications Ordered in ED Medications - No data to display  ED Course/ Medical Decision Making/ A&P   {   Click here for ABCD2, HEART and other calculatorsREFRESH Note before signing :1}                          Medical Decision Making Amount and/or Complexity of Data Reviewed Labs: ordered. Radiology: ordered.   ***  {Document critical care time when appropriate:1} {Document review of labs and clinical decision tools ie heart score, Chads2Vasc2 etc:1}  {Document your independent review of radiology images, and any outside records:1} {Document your  discussion with family members, caretakers, and with consultants:1} {Document social determinants of health affecting pt's care:1} {Document your decision making why or why not admission, treatments were needed:1} Final Clinical Impression(s) / ED Diagnoses Final diagnoses:  None    Rx / DC Orders ED Discharge Orders     None

## 2023-04-07 NOTE — ED Triage Notes (Signed)
Pt BIBGEMS w/ c/o left sided weakness, and left sided facial droop. Last seen normal at 0550 this AM, when in bathroom getting up she became shaky and then had the left sided weakness and facial droop. All symptoms resolved approximately 15-20 minutes later.  Baseline able to ambulate with minimal assistance, speak with no difficulties, no deficits from last stroke  Hx CVA   107 BG 152/90 Hr 50

## 2023-04-07 NOTE — ED Notes (Signed)
Patient transported to MRI 

## 2023-04-07 NOTE — Discharge Instructions (Signed)
You were seen for your shaking and weakness in the emergency department.  This was likely from a seizure.  At home, please stop taking your Wellbutrin and talk to your primary doctor about what other medications he can take instead of this.  Do not drive or swim or do any activities that could be dangerous if you were to have another seizure.  Check your MyChart online for the results of any tests that had not resulted by the time you left the emergency department.   Follow-up with your primary doctor in 2-3 days regarding your visit.  Follow-up with neurology in 1 to 2 weeks and talk to them about an EEG.  Return immediately to the emergency department if you experience any of the following: Shaking lasting more than 5 minutes, weakness, facial droop, difficulty speaking, or any other concerning symptoms.    Thank you for visiting our Emergency Department. It was a pleasure taking care of you today.

## 2023-04-08 DIAGNOSIS — Z09 Encounter for follow-up examination after completed treatment for conditions other than malignant neoplasm: Secondary | ICD-10-CM | POA: Diagnosis not present

## 2023-04-08 DIAGNOSIS — R569 Unspecified convulsions: Secondary | ICD-10-CM | POA: Diagnosis not present

## 2023-04-08 NOTE — Progress Notes (Signed)
Subjective: Chief Complaint  Patient presents with   Nail Problem    Rm 13 Left great toe nail fungus. Pt states she has no pain but was told by there facility she need her nail taken off. Pt not 100% aware of why she is here.    82 year old female presents after the above concerns.  No swelling redness or drainage.  She has no other concerns.  Objective: AAO x3, NAD DP/PT pulses palpable bilaterally, CRT less than 3 seconds The left hallux nail is hypertrophic, dystrophic with yellow, brown discoloration.  No edema, erythema.  There is no signs of infection.  No pain on exam. No pain with calf compression, swelling, warmth, erythema  Assessment: Left hallux onychomycosis  Plan: -All treatment options discussed with the patient including all alternatives, risks, complications.  -We discussed treatment options with the nail.  We did discuss removal of the nail but this is not a guarantee for come back again better.  We discussed permanent nail removal.  Patient is very nervous about this since it is not infected or causing any pain we will and continue to treat this conservatively for now.  I did debride the nail with any complications or bleeding.  Prescribed Penlac to try to help with the fungus. -Patient encouraged to call the office with any questions, concerns, change in symptoms.   Vivi Barrack DPM

## 2023-04-09 ENCOUNTER — Encounter: Payer: Self-pay | Admitting: Diagnostic Neuroimaging

## 2023-04-09 ENCOUNTER — Ambulatory Visit: Payer: Medicare Other | Admitting: Diagnostic Neuroimaging

## 2023-04-09 VITALS — BP 139/85 | HR 78 | Ht 63.0 in | Wt 184.0 lb

## 2023-04-09 DIAGNOSIS — R259 Unspecified abnormal involuntary movements: Secondary | ICD-10-CM | POA: Diagnosis not present

## 2023-04-09 DIAGNOSIS — R251 Tremor, unspecified: Secondary | ICD-10-CM | POA: Diagnosis not present

## 2023-04-09 DIAGNOSIS — I6381 Other cerebral infarction due to occlusion or stenosis of small artery: Secondary | ICD-10-CM | POA: Diagnosis not present

## 2023-04-09 NOTE — Patient Instructions (Signed)
  GENERALIZED TREMORS (no LOC); transient left sided weakness (possible TIA?) - check EEG - continue medical mgmt of stroke risk factors  LEFT basal ganglia stroke (likely small vessel thrombosis; hyperlipidemia and HTN) - aspirin 81mg  daily, continue atorvastatin 80mg  daily, BP control - history of sleep apnea; intolerant of CPAP in past  DYSTHYMIA / DEPRESSION / FLAT AFFECT - continue venlafaxine, olanzapine - consider psychiatry followup

## 2023-04-09 NOTE — Progress Notes (Signed)
[facility]  PATIENT: Jessica [name] DOB: 10/15/1941  REFERRING CLINICIAN: Merri Brunette, MD HISTORY FROM: patient REASON FOR VISIT: new consult   HISTORICAL  CHIEF COMPLAINT:  Chief Complaint  Patient presents with   Rm 6    Here with her oldest daughter for evaluation of new onset seizure on 04/07/23. She was seen in the ER that day and the notes are on file in chart. [name] daughter denies any known seizures since then. The initial seizure was witnessed by the med tech at [facility] assisted living.     HISTORY OF PRESENT ILLNESS:   UPDATE (04/09/23, VRP): Since last visit, was stable until 04/07/23, had some generalized shakiness and left sided weakness (15-20 minutes) then resolved. Unclear if she was unresponsive or not (conflicting reports per staff via daughter; vs ER notes and patient's memory of event). Went to ER and had labs and MRI, which were unremarkable. Wellbutrin was discontinued at ER, due to possibility of seizure. Since then has been somewhat withdrawn and quiet.   PRIOR HPI (05/26/22, VRP): 82 year old female here for evaluation of stroke.  History of hypertension, hyperlipidemia, hypothyroidism, asthma, breast cancer, presented to the hospital on 03/20/2022 with right-sided weakness and slurred speech.  Patient found to have acute left basal ganglia ischemic infarction.  Old right basal ganglia infarct was also noted, stable from 2021 or earlier.  Stroke work-up was completed.  Patient went to inpatient rehabilitation and then home.  Overall doing well.  Still working on balance and coordination.  Tolerating medications.    REVIEW OF SYSTEMS: Full 14 system review of systems performed and negative with exception of: As per HPI.  ALLERGIES: Allergies  Allergen Reactions   Advil [Ibuprofen] Hives and Itching   Codeine Hives   Crestor [Rosuvastatin Calcium] Other (See Comments)    Other reaction(s): pain   Penicillins Hives    Has patient had  a PCN reaction causing immediate rash, facial/tongue/throat swelling, SOB or lightheadedness with hypotension: yes  Has patient had a PCN reaction causing severe rash involving mucus membranes or skin necrosis:  no  Has patient had a PCN reaction that required hospitalization:  no  Has patient had a PCN reaction occurring within the last 10 years: no  If all of the above answers are "NO", then may proceed with Cephalosporin use.  Has patient had a PCN reaction causing severe rash involving mucus membranes or skin necrosis:  no  Has patient had a PCN reaction that required hospitalization:  no  Has patient had a PCN reaction causing immediate rash, facial/tongue/throat swelling, SOB or lightheadedness with hypotension: yes, Has patient had a PCN reaction causing severe rash involving mucus membranes or skin necrosis:  no, Has patient had a PCN reaction that required hospitalization:  no, Has patient had a PCN reaction occurring within the last 10 years: no, If all of the above answers are "NO", then may proceed with Cephalosporin use.   Sulfamethoxazole-Trimethoprim Hives   Bacitracin-Polymyxin B Dermatitis and Rash   Benazepril Swelling    Possible cause of tongue swelling   Other Rash    Band-aid    HOME MEDICATIONS: Outpatient Medications Prior to Visit  Medication Sig Dispense Refill   acetaminophen (TYLENOL) 500 MG tablet Take 500 mg by mouth every 6 (six) hours as needed.     ascorbic acid (VITAMIN C) 500 MG tablet Take 1 tablet (500 mg total) by mouth daily. 30 tablet 0   aspirin 81 MG chewable tablet Chew 1  tablet (81 mg total) by mouth daily. 30 tablet 3   atorvastatin (LIPITOR) 80 MG tablet Take 1 tablet (80 mg total) by mouth at bedtime. (Patient taking differently: Take 80 mg by mouth daily.) 30 tablet 0   cephALEXin (KEFLEX) 250 MG capsule Take 250 mg by mouth daily.     cholecalciferol (VITAMIN D3) 25 MCG (1000 UNIT) tablet Take 1 tablet (1,000 Units total) by mouth daily.  30 tablet 0   ciclopirox (PENLAC) 8 % solution Apply topically at bedtime. Apply over nail and surrounding skin. Apply daily over previous coat. After seven (7) days, may remove with alcohol and continue cycle. 6.6 mL 0   Cranberry 500 MG CHEW Chew 1,000 mg by mouth daily.     diclofenac Sodium (VOLTAREN) 1 % GEL Apply 2 g topically 4 (four) times daily as needed (shoulder pain). 2 g 0   docusate sodium (COLACE) 100 MG capsule Take 1 capsule (100 mg total) by mouth 2 (two) times daily. 60 capsule 2   estradiol (ESTRACE) 0.1 MG/GM vaginal cream Place vaginally.     ferrous sulfate 325 (65 FE) MG tablet Take 1 tablet (325 mg total) by mouth daily with breakfast. 30 tablet 0   fexofenadine (ALLEGRA) 180 MG tablet Take 180 mg by mouth daily.     folic acid (FOLVITE) 1 MG tablet Take 1 mg by mouth 2 (two) times daily.     hydrochlorothiazide (HYDRODIURIL) 25 MG tablet Take 25 mg by mouth daily.     hydrocortisone (ANUSOL-HC) 2.5 % rectal cream Apply 1 Application topically 2 (two) times daily.     L-Lysine 1000 MG TABS Take 1,000 mg by mouth as needed. As needed for fever blister     lactulose (CHRONULAC) 10 GM/15ML solution Take 45 mLs (30 g total) by mouth 2 (two) times daily as needed for mild constipation or moderate constipation. 946 mL 2   levothyroxine (SYNTHROID) 75 MCG tablet Take 1 tablet (75 mcg total) by mouth daily. 30 tablet 0   losartan (COZAAR) 50 MG tablet Take 50 mg by mouth daily.     MAGNESIUM GLYCINATE PO Take 1 each by mouth daily. gummy     Multiple Vitamins-Minerals (CENTRUM ADULTS MULTIGUMMIES PO) Take 2 each by mouth daily.     Multiple Vitamins-Minerals (OCUVITE ADULT FORMULA PO) Take 1 tablet by mouth daily.     nystatin powder Apply 1 Application topically 3 (three) times daily. 60 g to groin folds twice daily     OLANZapine (ZYPREXA) 2.5 MG tablet Take 2.5 mg by mouth daily.     omeprazole (PRILOSEC) 20 MG capsule Take 20 mg by mouth 2 (two) times daily.     potassium  chloride (KLOR-CON) 10 MEQ tablet Take 10 mEq by mouth 2 (two) times daily.     venlafaxine XR (EFFEXOR-XR) 75 MG 24 hr capsule Take 3 capsules (225 mg total) by mouth daily. 90 capsule 0   Vitamin D, Ergocalciferol, (DRISDOL) 1.25 MG (50000 UNIT) CAPS capsule Take 1 capsule (50,000 Units total) by mouth every 7 (seven) days. 20 capsule 0   pantoprazole (PROTONIX) 40 MG tablet Take 1 tablet (40 mg total) by mouth 2 (two) times daily. (Patient not taking: Reported on 04/09/2023) 60 tablet 0   acetaminophen (TYLENOL) 325 MG tablet Take 2 tablets (650 mg total) by mouth every 6 (six) hours as needed for mild pain (or Fever >/= 101). (Patient not taking: Reported on 04/09/2023)     hydrochlorothiazide (MICROZIDE) 12.5 MG capsule Take  12.5 mg by mouth daily. (Patient not taking: Reported on 01/12/2023)     No facility-administered medications prior to visit.    PAST MEDICAL HISTORY: Past Medical History:  Diagnosis Date   Allergy    Anemia    Anxiety    Asthma    in past, no inhalers now   Blood transfusion without reported diagnosis    Breast cancer (HCC) 01/2017   left breast    Breast cancer of upper-inner quadrant of left female breast (HCC) 03/11/2017   Cancer (HCC) 01/2017   left breast   Cataract    bilateral removed   Chronic kidney disease    kidney stones   Depression 08/31/2013   Dyslipidemia, goal LDL below 160 08/31/2013   Essential hypertension - well controlled 08/31/2013   Family history of breast cancer    Family history of melanoma    Family history of prostate cancer    GERD (gastroesophageal reflux disease)    Goals of care, counseling/discussion 03/11/2017   History of radiation therapy 10/13/17-11/11/17   left breast 40.05 Gy in 15 fractions, left breast boost 10 Gy in 5 fractions   Hypothyroidism    Ischemic stroke [facility])    May 2023;   Obesity (BMI 30-39.9) 08/31/2013   Personal history of chemotherapy 2018   Personal history of radiation therapy 2019   Thyroid  disease    Vasovagal near-syncope -rare 08/31/2013    PAST SURGICAL HISTORY: Past Surgical History:  Procedure Laterality Date   ABDOMINAL HYSTERECTOMY     total   BREAST BIOPSY Left 02/24/2017    malignant   BREAST LUMPECTOMY Left 03/23/2017   broken fingers     CATARACT EXTRACTION Bilateral    COLONOSCOPY     COLONOSCOPY WITH PROPOFOL N/A 06/03/2022   Procedure: COLONOSCOPY WITH PROPOFOL;  Surgeon: Jenel Lucks, MD;  Location: [facility];  Service: Gastroenterology;  Laterality: N/A;   ESOPHAGOGASTRODUODENOSCOPY (EGD) WITH PROPOFOL N/A 05/29/2022   Procedure: ESOPHAGOGASTRODUODENOSCOPY (EGD) WITH PROPOFOL;  Surgeon: Beverley Fiedler, MD;  Location: [facility];  Service: Gastroenterology;  Laterality: N/A;   EXCISIONAL HEMORRHOIDECTOMY     IR FLUORO GUIDE PORT INSERTION RIGHT  08/04/2017   IR REMOVAL TUN ACCESS W/ PORT W/O FL MOD SED  08/04/2017   IR US GUIDE VASC ACCESS RIGHT  08/04/2017   IRRIGATION AND DEBRIDEMENT ABSCESS Left 05/18/2017   Procedure: IRRIGATION AND DEBRIDEMENT LEFT AXILLARY ABSCESS;  Surgeon: Karie Soda, MD;  Location: [facility];  Service: General;  Laterality: Left;   kidney stones lithotripsy     POLYPECTOMY  06/03/2022   Procedure: POLYPECTOMY;  Surgeon: Jenel Lucks, MD;  Location: [facility];  Service: Gastroenterology;;   PORTACATH PLACEMENT Right 03/23/2017   Procedure: INSERTION PORT-A-CATH;  Surgeon: Harriette Bouillon, MD;  Location: [facility];  Service: General;  Laterality: Right;   RADIOACTIVE SEED GUIDED PARTIAL MASTECTOMY WITH AXILLARY SENTINEL LYMPH NODE BIOPSY Left 03/23/2017   Procedure: LEFT BREAST RADIOACTIVE SEED GUIDED PARTIAL MASTECTOMY AND  SENTINEL LYMPH NODE MAPPING;  Surgeon: Harriette Bouillon, MD;  Location: [facility];  Service: General;  Laterality: Left;   ROTATOR CUFF REPAIR     left    TONSILLECTOMY     WISDOM TOOTH EXTRACTION      FAMILY HISTORY: Family History  Problem Relation Age of Onset    Heart disease Mother        Angina   Heart disease Father    Heart attack Brother  Died of MI in his mid 62s   Prostate cancer Brother        dx in his early 21s   Cervical cancer Maternal Aunt    Breast cancer Paternal Aunt        dx over 52   Bone cancer Cousin        maternal first cousin dx in her 10s-60s   Lung cancer Cousin        paternal first cousin   Colon cancer Neg Hx    Pancreatic cancer Neg Hx    Stomach cancer Neg Hx    Esophageal cancer Neg Hx    Rectal cancer Neg Hx    Seizures Neg Hx     SOCIAL HISTORY: Social History   Socioeconomic History   Marital status: Married    Spouse name: Not on file   Number of children: 3   Years of education: Not on file   Highest education level: Not on file  Occupational History   Occupation: retired   Tobacco Use   Smoking status: Never    Passive exposure: Past   Smokeless tobacco: Never   Tobacco comments:    Secondhand exposure, husband was heavy smoker  Vaping Use   Vaping Use: Never used  Substance and Sexual Activity   Alcohol use: No   Drug use: No   Sexual activity: Not on file  Other Topics Concern   Not on file  Social History Narrative   Married mother of 3 with one grandchild. Never smoked. Does not drink alcohol.Walks 3-4 days a week for roughly 20-30 minutes.      Update 04/09/2023   Lives at [facility]   Right handed   Social Determinants of Health   Financial Resource Strain: Not on file  Food Insecurity: Not on file  Transportation Needs: Not on file  Physical Activity: Not on file  Stress: Not on file  Social Connections: Not on file  Intimate Partner Violence: Not on file     PHYSICAL EXAM  GENERAL EXAM/CONSTITUTIONAL: Vitals:  Vitals:   04/09/23 1137  BP: 139/85  Pulse: 78  Weight: 184 lb (83.5 kg)  Height: 5\' 3"  (1.6 m)   Body mass index is 32.59 kg/m. Wt Readings from Last 3 Encounters:  04/09/23 184 lb (83.5 kg)  04/07/23 178 lb 9.2 oz  (81 kg)  01/12/23 178 lb (80.7 kg)   Patient is in no distress; well developed, nourished and groomed; neck is supple  CARDIOVASCULAR: Examination of carotid arteries is normal; no carotid bruits Regular rate and rhythm, no murmurs Examination of peripheral vascular system by observation and palpation is normal  EYES: Ophthalmoscopic exam of optic discs and posterior segments is normal; no papilledema or hemorrhages No results found.  MUSCULOSKELETAL: Gait, strength, tone, movements noted in Neurologic exam below  NEUROLOGIC: MENTAL STATUS:      No data to display         awake, alert, oriented to person, place and time recent and remote memory intact normal attention and concentration language fluent, comprehension intact, naming intact fund of knowledge appropriate  CRANIAL NERVE:  2nd - no papilledema on fundoscopic exam 2nd, 3rd, 4th, 6th - pupils equal and reactive to light, visual fields full to confrontation, extraocular muscles intact, no nystagmus 5th - facial sensation symmetric 7th - facial strength --> DECR RIGHT NL FOLD 8th - hearing intact 9th - palate elevates symmetrically, uvula midline 11th - shoulder shrug symmetric 12th - tongue protrusion  midline  MOTOR:  normal bulk and tone, full strength in the BUE, BLE  SENSORY:  normal and symmetric to light touch, temperature, vibration   COORDINATION:  finger-nose-finger, fine finger movements normal  REFLEXES:  deep tendon reflexes TRACE and symmetric  GAIT/STATION:  narrow based gait; USING WALKER     DIAGNOSTIC DATA (LABS, IMAGING, TESTING) - I reviewed patient records, labs, notes, testing and imaging myself where available.  Lab Results  Component Value Date   WBC 6.0 04/07/2023   HGB 12.6 04/07/2023   HCT 37.5 04/07/2023   MCV 101.6 (H) 04/07/2023   PLT 200 04/07/2023      Component Value Date/Time   NA 136 04/07/2023 0736   NA 139 04/13/2022 1332   NA 143 10/08/2017 1042    NA 141 06/16/2017 1014   K 3.7 04/07/2023 0736   K 3.4 10/08/2017 1042   K 3.8 06/16/2017 1014   CL 97 (L) 04/07/2023 0736   CL 100 10/08/2017 1042   CO2 26 04/07/2023 0736   CO2 30 10/08/2017 1042   CO2 31 (H) 06/16/2017 1014   GLUCOSE 102 (H) 04/07/2023 0736   GLUCOSE 126 (H) 10/08/2017 1042   BUN 10 04/07/2023 0736   BUN 14 04/13/2022 1332   BUN 10 10/08/2017 1042   BUN 8.9 06/16/2017 1014   CREATININE 0.72 04/07/2023 0736   CREATININE 0.82 11/27/2022 1322   CREATININE 0.9 10/08/2017 1042   CREATININE 0.8 06/16/2017 1014   CALCIUM 9.4 04/07/2023 0736   CALCIUM 9.5 10/08/2017 1042   CALCIUM 10.1 06/16/2017 1014   PROT 6.2 (L) 04/07/2023 0736   PROT 6.7 10/08/2017 1042   PROT 7.1 06/16/2017 1014   ALBUMIN 3.4 (L) 04/07/2023 0736   ALBUMIN 3.3 10/08/2017 1042   ALBUMIN 3.5 06/16/2017 1014   AST 22 04/07/2023 0736   AST 15 11/27/2022 1322   AST 21 06/16/2017 1014   ALT 23 04/07/2023 0736   ALT 16 11/27/2022 1322   ALT 20 10/08/2017 1042   ALT 20 06/16/2017 1014   ALKPHOS 54 04/07/2023 0736   ALKPHOS 68 10/08/2017 1042   ALKPHOS 64 06/16/2017 1014   BILITOT 0.6 04/07/2023 0736   BILITOT 0.4 11/27/2022 1322   BILITOT 0.37 06/16/2017 1014   GFRNONAA >60 04/07/2023 0736   GFRNONAA >60 11/27/2022 1322   GFRAA >60 03/14/2020 1148   Lab Results  Component Value Date   CHOL 297 (H) 03/21/2022   HDL 49 03/21/2022   LDLCALC 216 (H) 03/21/2022   TRIG 161 (H) 03/21/2022   CHOLHDL 6.1 03/21/2022   Lab Results  Component Value Date   HGBA1C 5.6 03/21/2022   Lab Results  Component Value Date   VITAMINB12 349 04/07/2023   Lab Results  Component Value Date   TSH 0.392 06/10/2022    03/21/22 MRI brain  1. Small focus of acute ischemia within the left caudate body. No hemorrhage or mass effect. 2. Old right basal ganglia small vessel infarct and findings of chronic microvascular disease.  03/21/22 CTA head 1. No emergent large vessel occlusion or high-grade stenosis  of the intracranial or cervical arteries. 2. Aortic Atherosclerosis (ICD10-I70.0).  04/07/23 MRI brain - Background parenchymal volume loss with prominence of the ventricular system and extra-axial CSF spaces is unchanged. Remote infarcts in the bilateral basal ganglia with associated chronic blood products, right larger than left, with associated ex vacuo dilatation of the right lateral ventricles are unchanged. Additional patchy FLAIR signal abnormality in the supratentorial white matter likely reflecting  underlying chronic small-vessel ischemic change is also stable. - No acute intracranial pathology.    ASSESSMENT AND PLAN  82 y.o. year old female here with:   Dx:  1. Involuntary movements   2. Tremor   3. Basal ganglia stroke (HCC)      PLAN:  GENERALIZED TREMORS (no LOC); transient left sided weakness (possible TIA?) - check EEG - continue medical mgmt of stroke risk factors  HISTORY OF BILATERAL BASAL GANGLIA STROKES (likely small vessel thrombosis; hyperlipidemia and HTN) - aspirin 81mg  daily, continue atorvastatin 80mg  daily, BP control - history of sleep apnea; intolerant of CPAP in past  DYSTHYMIA / DEPRESSION / FLAT AFFECT / PSEUDOBULBAR AFFECT - continue venlafaxine, olanzapine - consider psychiatry followup  Orders Placed This Encounter  Procedures   EEG adult   Return for pending if symptoms worsen or fail to improve, pending test results.    Suanne Marker, MD 04/09/2023, 12:01 PM Certified in Neurology, Neurophysiology and Neuroimaging  [facility] [address] [place], [place] [address] [phone]

## 2023-05-04 ENCOUNTER — Ambulatory Visit: Payer: Medicare Other | Admitting: Diagnostic Neuroimaging

## 2023-05-04 DIAGNOSIS — R259 Unspecified abnormal involuntary movements: Secondary | ICD-10-CM

## 2023-05-04 DIAGNOSIS — R251 Tremor, unspecified: Secondary | ICD-10-CM

## 2023-05-11 NOTE — Procedures (Signed)
   [facility]  EEG (ELECTROENCEPHALOGRAM) REPORT   STUDY DATE: 05/04/23 PATIENT NAME: Jessica [name] DOB: June 03, 1941 MRN: [id number]  ORDERING CLINICIAN: Joycelyn Schmid, MD   TECHNOLOGIST: Marcheta Grammes TECHNIQUE: Electroencephalogram was recorded utilizing standard 10-20 system of lead placement and reformatted into average and bipolar montages.  RECORDING TIME: 28 minutes ACTIVATION: photic stimulation  CLINICAL INFORMATION: 82 year old female with abnormal movements.  FINDINGS: Posterior dominant background rhythms, which attenuate with eye opening, ranging 8-9 hertz and 30-40 microvolts. No focal, lateralizing, epileptiform activity or seizures are seen. Patient recorded in the awake and drowsy state. EKG channel shows regular rhythm of 65-70 beats per minute.   IMPRESSION:   Normal EEG in the awake and drowsy states.    INTERPRETING PHYSICIAN:  Suanne Marker, MD Certified in Neurology, Neurophysiology and Neuroimaging  [facility] [address] [place], [place] [address] [phone]

## 2023-05-31 ENCOUNTER — Inpatient Hospital Stay: Payer: Medicare Other | Attending: Hematology & Oncology

## 2023-05-31 ENCOUNTER — Inpatient Hospital Stay: Payer: Medicare Other | Admitting: Hematology & Oncology

## 2023-05-31 ENCOUNTER — Other Ambulatory Visit: Payer: Self-pay

## 2023-05-31 ENCOUNTER — Encounter: Payer: Self-pay | Admitting: Hematology & Oncology

## 2023-05-31 ENCOUNTER — Inpatient Hospital Stay: Payer: Medicare Other

## 2023-05-31 VITALS — BP 145/62 | HR 67 | Temp 98.0°F | Resp 20 | Ht 63.0 in | Wt 186.0 lb

## 2023-05-31 DIAGNOSIS — Z9221 Personal history of antineoplastic chemotherapy: Secondary | ICD-10-CM | POA: Diagnosis not present

## 2023-05-31 DIAGNOSIS — C50912 Malignant neoplasm of unspecified site of left female breast: Secondary | ICD-10-CM | POA: Diagnosis not present

## 2023-05-31 DIAGNOSIS — C50212 Malignant neoplasm of upper-inner quadrant of left female breast: Secondary | ICD-10-CM | POA: Diagnosis not present

## 2023-05-31 DIAGNOSIS — Z923 Personal history of irradiation: Secondary | ICD-10-CM | POA: Diagnosis not present

## 2023-05-31 DIAGNOSIS — Z171 Estrogen receptor negative status [ER-]: Secondary | ICD-10-CM | POA: Insufficient documentation

## 2023-05-31 LAB — CMP (CANCER CENTER ONLY)
ALT: 23 U/L (ref 0–44)
AST: 18 U/L (ref 15–41)
Albumin: 4 g/dL (ref 3.5–5.0)
Alkaline Phosphatase: 70 U/L (ref 38–126)
Anion gap: 10 (ref 5–15)
BUN: 12 mg/dL (ref 8–23)
CO2: 30 mmol/L (ref 22–32)
Calcium: 9.5 mg/dL (ref 8.9–10.3)
Chloride: 99 mmol/L (ref 98–111)
Creatinine: 0.64 mg/dL (ref 0.44–1.00)
GFR, Estimated: >60 mL/min (ref >60)
Glucose, Bld: 117 mg/dL — ABNORMAL HIGH (ref 70–99)
Potassium: 3.2 mmol/L — ABNORMAL LOW (ref 3.5–5.1)
Sodium: 139 mmol/L (ref 135–145)
Total Bilirubin: 0.3 mg/dL (ref 0.3–1.2)
Total Protein: 6.4 g/dL — ABNORMAL LOW (ref 6.5–8.1)

## 2023-05-31 LAB — CBC WITH DIFFERENTIAL (CANCER CENTER ONLY)
Abs Immature Granulocytes: 0.01 10*3/uL (ref 0.00–0.07)
Basophils Absolute: 0 10*3/uL (ref 0.0–0.1)
Basophils Relative: 0 %
Eosinophils Absolute: 0 10*3/uL (ref 0.0–0.5)
Eosinophils Relative: 0 %
HCT: 37.4 % (ref 36.0–46.0)
Hemoglobin: 12.3 g/dL (ref 12.0–15.0)
Immature Granulocytes: 0 %
Lymphocytes Relative: 28 %
Lymphs Abs: 1.3 10*3/uL (ref 0.7–4.0)
MCH: 33.8 pg (ref 26.0–34.0)
MCHC: 32.9 g/dL (ref 30.0–36.0)
MCV: 102.7 fL — ABNORMAL HIGH (ref 80.0–100.0)
Monocytes Absolute: 0.4 10*3/uL (ref 0.1–1.0)
Monocytes Relative: 8 %
Neutro Abs: 3.1 10*3/uL (ref 1.7–7.7)
Neutrophils Relative %: 64 %
Platelet Count: 225 10*3/uL (ref 150–400)
RBC: 3.64 MIL/uL — ABNORMAL LOW (ref 3.87–5.11)
RDW: 11.9 % (ref 11.5–15.5)
WBC Count: 4.8 10*3/uL (ref 4.0–10.5)
nRBC: 0 % (ref 0.0–0.2)

## 2023-05-31 LAB — LACTATE DEHYDROGENASE: LDH: 125 U/L (ref 98–192)

## 2023-05-31 NOTE — Patient Instructions (Signed)

## 2023-05-31 NOTE — Progress Notes (Signed)
Hematology and Oncology Follow Up Visit  Jessica [name] [id number] 11/08/1940 82 y.o. 05/31/2023   Principle Diagnosis:  Stage IIA ([id number]) invasive ductal ca of the LEFT breast - TRIPLE NEGATIVE Staphylococcus aureus wound infection of the LEFT axilla Cellulitis of the left breast  Past Therapy:   Taxotere/Carboplatin - s/p cycle #4 -completed adjuvant therapy on 09/09/2017 Radiation therapy to the right breast.-Completed 4005 rad with an additional 1000 rad boost on 11/11/2017  Current Therapy: Observation   Interim History:  Ms. Vosburgh is here today for follow-up.  She now is living at assisted living.  She is at [employer].  Hopefully, she is enjoying her self there.  It is hard to tell.  She has such a flat affect.  Her daughter, Clydie Braun, who usually brings her in has COVID.  She is doing okay with this however.  She otherwise seems to be managing.  She seems to be eating okay.  She is having no problems with nausea or vomiting.  She is having no bleeding.  There is no melena or bright red blood per rectum.  She has had no rashes.  She may have little bit of leg swelling which I am sure is probably from medications.  She has had no problems with regard to the breast itself.  She has had no headache.  Overall, I would say that her performance status is probably ECOG 3.    Medications:  Allergies as of 05/31/2023       Reactions   Advil [ibuprofen] Hives, Itching   Codeine Hives   Crestor [rosuvastatin Calcium] Other (See Comments)   Other reaction(s): pain   Penicillins Hives   Has patient had a PCN reaction causing immediate rash, facial/tongue/throat swelling, SOB or lightheadedness with hypotension: yes Has patient had a PCN reaction causing severe rash involving mucus membranes or skin necrosis:  no Has patient had a PCN reaction that required hospitalization:  no Has patient had a PCN reaction occurring within the last 10 years: no If all of the above  answers are "NO", then may proceed with Cephalosporin use. Has patient had a PCN reaction causing severe rash involving mucus membranes or skin necrosis:  no Has patient had a PCN reaction that required hospitalization:  no Has patient had a PCN reaction causing immediate rash, facial/tongue/throat swelling, SOB or lightheadedness with hypotension: yes, Has patient had a PCN reaction causing severe rash involving mucus membranes or skin necrosis:  no, Has patient had a PCN reaction that required hospitalization:  no, Has patient had a PCN reaction occurring within the last 10 years: no, If all of the above answers are "NO", then may proceed with Cephalosporin use.   Sulfamethoxazole-trimethoprim Hives   Bacitracin-polymyxin B Dermatitis, Rash   Benazepril Swelling   Possible cause of tongue swelling   Other Rash   Band-aid        Medication List        Accurate as of May 31, 2023  3:43 PM. If you have any questions, ask your nurse or doctor.          acetaminophen 500 MG tablet Commonly known as: TYLENOL Take 500 mg by mouth every 6 (six) hours as needed.   ascorbic acid 500 MG tablet Commonly known as: VITAMIN C Take 1 tablet (500 mg total) by mouth daily.   aspirin 81 MG chewable tablet Chew 1 tablet (81 mg total) by mouth daily.   atorvastatin 80 MG tablet Commonly known as: LIPITOR Take 1  tablet (80 mg total) by mouth at bedtime. What changed: when to take this   cephALEXin 250 MG capsule Commonly known as: KEFLEX Take 250 mg by mouth daily.   cholecalciferol 25 MCG (1000 UNIT) tablet Commonly known as: VITAMIN D3 Take 1 tablet (1,000 Units total) by mouth daily.   ciclopirox 8 % solution Commonly known as: PENLAC Apply topically at bedtime. Apply over nail and surrounding skin. Apply daily over previous coat. After seven (7) days, may remove with alcohol and continue cycle.   Cranberry 500 MG Chew Chew 1,000 mg by mouth daily.   diclofenac Sodium 1 %  Gel Commonly known as: VOLTAREN Apply 2 g topically 4 (four) times daily as needed (shoulder pain).   docusate sodium 100 MG capsule Commonly known as: Colace Take 1 capsule (100 mg total) by mouth 2 (two) times daily.   estradiol 0.1 MG/GM vaginal cream Commonly known as: ESTRACE Place vaginally.   ferrous sulfate 325 (65 FE) MG tablet Take 1 tablet (325 mg total) by mouth daily with breakfast.   fexofenadine 180 MG tablet Commonly known as: ALLEGRA Take 180 mg by mouth daily.   folic acid 1 MG tablet Commonly known as: FOLVITE Take 1 mg by mouth 2 (two) times daily.   hydrochlorothiazide 25 MG tablet Commonly known as: HYDRODIURIL Take 25 mg by mouth daily.   hydrocortisone 2.5 % rectal cream Commonly known as: ANUSOL-HC Apply 1 Application topically 2 (two) times daily.   L-Lysine 1000 MG Tabs Take 1,000 mg by mouth as needed. As needed for fever blister   lactulose 10 GM/15ML solution Commonly known as: CHRONULAC Take 45 mLs (30 g total) by mouth 2 (two) times daily as needed for mild constipation or moderate constipation.   levothyroxine 75 MCG tablet Commonly known as: SYNTHROID Take 1 tablet (75 mcg total) by mouth daily.   losartan 50 MG tablet Commonly known as: COZAAR Take 50 mg by mouth daily.   MAGNESIUM GLYCINATE PO Take 1 each by mouth daily. gummy   nystatin powder Apply 1 Application topically 3 (three) times daily. 60 g to groin folds twice daily   OCUVITE ADULT FORMULA PO Take 1 tablet by mouth daily.   CENTRUM ADULTS MULTIGUMMIES PO Take 2 each by mouth daily.   OLANZapine 2.5 MG tablet Commonly known as: ZYPREXA Take 2.5 mg by mouth daily.   omeprazole 20 MG capsule Commonly known as: PRILOSEC Take 20 mg by mouth 2 (two) times daily.   pantoprazole 40 MG tablet Commonly known as: PROTONIX Take 1 tablet (40 mg total) by mouth 2 (two) times daily.   potassium chloride 10 MEQ tablet Commonly known as: KLOR-CON Take 10 mEq by  mouth 2 (two) times daily.   venlafaxine XR 75 MG 24 hr capsule Commonly known as: EFFEXOR-XR Take 3 capsules (225 mg total) by mouth daily.   Vitamin D (Ergocalciferol) 1.25 MG (50000 UNIT) Caps capsule Commonly known as: DRISDOL Take 1 capsule (50,000 Units total) by mouth every 7 (seven) days.        Allergies:  Allergies  Allergen Reactions   Advil [Ibuprofen] Hives and Itching   Codeine Hives   Crestor [Rosuvastatin Calcium] Other (See Comments)    Other reaction(s): pain   Penicillins Hives    Has patient had a PCN reaction causing immediate rash, facial/tongue/throat swelling, SOB or lightheadedness with hypotension: yes  Has patient had a PCN reaction causing severe rash involving mucus membranes or skin necrosis:  no  Has patient had  a PCN reaction that required hospitalization:  no  Has patient had a PCN reaction occurring within the last 10 years: no  If all of the above answers are "NO", then may proceed with Cephalosporin use.  Has patient had a PCN reaction causing severe rash involving mucus membranes or skin necrosis:  no  Has patient had a PCN reaction that required hospitalization:  no  Has patient had a PCN reaction causing immediate rash, facial/tongue/throat swelling, SOB or lightheadedness with hypotension: yes, Has patient had a PCN reaction causing severe rash involving mucus membranes or skin necrosis:  no, Has patient had a PCN reaction that required hospitalization:  no, Has patient had a PCN reaction occurring within the last 10 years: no, If all of the above answers are "NO", then may proceed with Cephalosporin use.   Sulfamethoxazole-Trimethoprim Hives   Bacitracin-Polymyxin B Dermatitis and Rash   Benazepril Swelling    Possible cause of tongue swelling   Other Rash    Band-aid    Past Medical History, Surgical history, Social history, and Family History were reviewed and updated.  Review of Systems: Review of Systems  Constitutional:  Negative.   HENT: Negative.    Eyes: Negative.   Respiratory: Negative.    Cardiovascular: Negative.   Gastrointestinal: Negative.   Genitourinary: Negative.   Musculoskeletal: Negative.   Skin: Negative.   Neurological: Negative.   Endo/Heme/Allergies: Negative.   Psychiatric/Behavioral: Negative.       Physical Exam:  height is 5\' 3"  (1.6 m) and weight is 186 lb (84.4 kg). Her oral temperature is 98 F (36.7 C). Her blood pressure is 145/62 (abnormal) and her pulse is 67. Her respiration is 20 and oxygen saturation is 98%.   .  Physical Exam Vitals reviewed.  Constitutional:      Comments: Her breast exam shows right breast with no masses, edema or erythema.  There is no right axillary adenopathy.  Left breast shows the lumpectomy that is well-healed.  She has some contraction of the left breast.  There are some firmness at the lumpectomy site secondary to prior infection that required secondary intention.  There is no distinct mass in the left breast.  There is no left axillary adenopathy.  There is some telangiectasias on the left breast from radiation.  HENT:     Head: Normocephalic and atraumatic.  Eyes:     Pupils: Pupils are equal, round, and reactive to light.  Cardiovascular:     Rate and Rhythm: Normal rate and regular rhythm.     Heart sounds: Normal heart sounds.  Pulmonary:     Effort: Pulmonary effort is normal.     Breath sounds: Normal breath sounds.  Abdominal:     General: Bowel sounds are normal.     Palpations: Abdomen is soft.  Genitourinary:    Comments: Rectal exam shows some small external hemorrhoids.  She had no mass in the rectal vault.  Her stool was dark brown and heme positive. Musculoskeletal:        General: No tenderness or deformity. Normal range of motion.     Cervical back: Normal range of motion.  Lymphadenopathy:     Cervical: No cervical adenopathy.  Skin:    General: Skin is warm and dry.     Findings: No erythema or rash.   Neurological:     Mental Status: She is alert and oriented to person, place, and time.     Comments: Neurologically, there is no focal deficits.  She  has a very flat affect.  She has a sort of monotone type voice.  Psychiatric:        Behavior: Behavior normal.        Thought Content: Thought content normal.        Judgment: Judgment normal.      Lab Results  Component Value Date   WBC 4.8 05/31/2023   HGB 12.3 05/31/2023   HCT 37.4 05/31/2023   MCV 102.7 (H) 05/31/2023   PLT 225 05/31/2023   Lab Results  Component Value Date   FERRITIN 7 (L) 05/28/2022   IRON 18 (L) 05/28/2022   TIBC 441 05/28/2022   UIBC 423 05/28/2022   IRONPCTSAT 4 (L) 05/28/2022   Lab Results  Component Value Date   RETICCTPCT 2.1 05/29/2022   RBC 3.64 (L) 05/31/2023   No results found for: "KPAFRELGTCHN", "LAMBDASER", "KAPLAMBRATIO" No results found for: "IGGSERUM", "IGA", "IGMSERUM" No results found for: "TOTALPROTELP", "ALBUMINELP", "A1GS", "A2GS", "BETS", "BETA2SER", "GAMS", "MSPIKE", "SPEI"   Chemistry      Component Value Date/Time   NA 139 05/31/2023 1430   NA 139 04/13/2022 1332   NA 143 10/08/2017 1042   NA 141 06/16/2017 1014   K 3.2 (L) 05/31/2023 1430   K 3.4 10/08/2017 1042   K 3.8 06/16/2017 1014   CL 99 05/31/2023 1430   CL 100 10/08/2017 1042   CO2 30 05/31/2023 1430   CO2 30 10/08/2017 1042   CO2 31 (H) 06/16/2017 1014   BUN 12 05/31/2023 1430   BUN 14 04/13/2022 1332   BUN 10 10/08/2017 1042   BUN 8.9 06/16/2017 1014   CREATININE 0.64 05/31/2023 1430   CREATININE 0.9 10/08/2017 1042   CREATININE 0.8 06/16/2017 1014      Component Value Date/Time   CALCIUM 9.5 05/31/2023 1430   CALCIUM 9.5 10/08/2017 1042   CALCIUM 10.1 06/16/2017 1014   ALKPHOS 70 05/31/2023 1430   ALKPHOS 68 10/08/2017 1042   ALKPHOS 64 06/16/2017 1014   AST 18 05/31/2023 1430   AST 21 06/16/2017 1014   ALT 23 05/31/2023 1430   ALT 20 10/08/2017 1042   ALT 20 06/16/2017 1014   BILITOT 0.3  05/31/2023 1430   BILITOT 0.37 06/16/2017 1014      Impression and Plan: Ms. Buys is a very pleasant 82 yo caucasian female with stage IIA ductal carcinoma of the left breast.  Her breast cancer is TRIPLE NEGATIVE.    She had her partial mastectomy in May 2018 followed by chemotherapy and radiation.  She completed chemotherapy in November 2018.  She then had radiation and completed radiation in January 2019.  I am glad that she is at assisted living.  I think this is helping her out and also helping her family.  I will still like to plan to get her back in follow-up.  Will still get her back in 6 months.  This will make it through the Holiday season.  She still has her Port-A-Cath in.  We had to make sure that this gets flushed every 2 months.  Josph Macho, MD 7/29/20243:43 PM

## 2023-06-03 NOTE — Therapy (Signed)
[facility] [address], [address] [place], [place], [address] Phone: [phone]   Fax:  [phone]  Patient Details  Name: Jessica [name] MRN: [id number] Date of Birth: 06/04/1941 Referring Provider:  [place] MD  Encounter Date: 06/03/2023  SPEECH THERAPY DISCHARGE SUMMARY  Visits from Start of Care: 7  Current functional level related to goals / functional outcomes: Pt did not return after her ST visit on  Goals and Impression from last attended session are below:  SHORT TERM GOALS: Target date: 11-20-22   1.Pt will bring memory compensation device/system to 60% of ST sessions Baseline: (5/5) Goal status: Met   2.  Pt will indicate knowledge she can use memory system for functional tasks (I.e., will open/acess system when asked her therapy schedule) x 3 sessions Baseline: 11/11/22, 11/16/22 Goal status: Met   3.  Undergo more in depth cognitive communication testing in first 4 sessions Baseline:  Goal status: Ongoing   4.  [name] will indicate she has told daughter/caregiver to administer her meds for 7 consecutive days Baseline:  Goal status: Ongoing     LONG TERM GOALS: Target date: 01/10/23   [name] will track appointments and other social events using compensations in 5 sessions Baseline:  Goal status: Ongoing   2.  [name] will indicate she has administered her meds correctly with SBA for 7 consecutive days Baseline:  Goal status: Ongoing   3.  [name] will indicate she feels more confident talking than initial sessions by improved PRM score/s Baseline:  Goal status: Ongoing    ASSESSMENT:   CLINICAL IMPRESSION: Patient is a 82 y.o. female who was seen today for treatment of memory in language. Discussion on memory compensation system continued today with pt's memory book. Calendar tasks also targeted. Voice volume was mid 60s dB.  [name] biggest barrier to progress at this time is her depressive  feelings about her current situation.    Remaining deficits: Assumed deficits remain.   Education / Equipment: See therapy notes.   Patient agrees to discharge. Patient goals were partially met. Patient is being discharged due to not returning since the last visit.Marland Kitchen    Sammuel Blick, CCC-SLP 06/03/2023, 2:23 PM  [facility] [address], [address] [place], [place], [address] Phone: [phone]   Fax:  [phone]

## 2023-06-08 DIAGNOSIS — M79674 Pain in right toe(s): Secondary | ICD-10-CM | POA: Diagnosis not present

## 2023-06-08 DIAGNOSIS — B351 Tinea unguium: Secondary | ICD-10-CM | POA: Diagnosis not present

## 2023-06-14 ENCOUNTER — Other Ambulatory Visit: Payer: Self-pay | Admitting: Podiatry

## 2023-06-23 ENCOUNTER — Emergency Department (HOSPITAL_BASED_OUTPATIENT_CLINIC_OR_DEPARTMENT_OTHER): Payer: Medicare Other

## 2023-06-23 ENCOUNTER — Encounter (HOSPITAL_BASED_OUTPATIENT_CLINIC_OR_DEPARTMENT_OTHER): Payer: Self-pay | Admitting: Emergency Medicine

## 2023-06-23 ENCOUNTER — Observation Stay (HOSPITAL_BASED_OUTPATIENT_CLINIC_OR_DEPARTMENT_OTHER)
Admission: EM | Admit: 2023-06-23 | Discharge: 2023-06-25 | Disposition: A | Discharge status: Skilled Nursing Facility | Payer: Medicare Other | Attending: Internal Medicine | Admitting: Internal Medicine

## 2023-06-23 ENCOUNTER — Other Ambulatory Visit: Payer: Self-pay

## 2023-06-23 DIAGNOSIS — N189 Chronic kidney disease, unspecified: Secondary | ICD-10-CM | POA: Diagnosis not present

## 2023-06-23 DIAGNOSIS — I10 Essential (primary) hypertension: Secondary | ICD-10-CM | POA: Diagnosis present

## 2023-06-23 DIAGNOSIS — E039 Hypothyroidism, unspecified: Secondary | ICD-10-CM | POA: Diagnosis not present

## 2023-06-23 DIAGNOSIS — Z1152 Encounter for screening for COVID-19: Secondary | ICD-10-CM | POA: Diagnosis not present

## 2023-06-23 DIAGNOSIS — Z8673 Personal history of transient ischemic attack (TIA), and cerebral infarction without residual deficits: Secondary | ICD-10-CM | POA: Insufficient documentation

## 2023-06-23 DIAGNOSIS — J45909 Unspecified asthma, uncomplicated: Secondary | ICD-10-CM | POA: Insufficient documentation

## 2023-06-23 DIAGNOSIS — R109 Unspecified abdominal pain: Secondary | ICD-10-CM | POA: Diagnosis not present

## 2023-06-23 DIAGNOSIS — K5900 Constipation, unspecified: Secondary | ICD-10-CM | POA: Diagnosis present

## 2023-06-23 DIAGNOSIS — I4581 Long QT syndrome: Secondary | ICD-10-CM | POA: Diagnosis not present

## 2023-06-23 DIAGNOSIS — Z79899 Other long term (current) drug therapy: Secondary | ICD-10-CM | POA: Diagnosis not present

## 2023-06-23 DIAGNOSIS — Z7722 Contact with and (suspected) exposure to environmental tobacco smoke (acute) (chronic): Secondary | ICD-10-CM | POA: Diagnosis not present

## 2023-06-23 DIAGNOSIS — Z7982 Long term (current) use of aspirin: Secondary | ICD-10-CM | POA: Diagnosis not present

## 2023-06-23 DIAGNOSIS — I6523 Occlusion and stenosis of bilateral carotid arteries: Secondary | ICD-10-CM | POA: Diagnosis not present

## 2023-06-23 DIAGNOSIS — I129 Hypertensive chronic kidney disease with stage 1 through stage 4 chronic kidney disease, or unspecified chronic kidney disease: Secondary | ICD-10-CM | POA: Insufficient documentation

## 2023-06-23 DIAGNOSIS — G9340 Encephalopathy, unspecified: Principal | ICD-10-CM | POA: Insufficient documentation

## 2023-06-23 DIAGNOSIS — Z853 Personal history of malignant neoplasm of breast: Secondary | ICD-10-CM | POA: Diagnosis not present

## 2023-06-23 DIAGNOSIS — E785 Hyperlipidemia, unspecified: Secondary | ICD-10-CM | POA: Diagnosis present

## 2023-06-23 DIAGNOSIS — R4182 Altered mental status, unspecified: Secondary | ICD-10-CM | POA: Insufficient documentation

## 2023-06-23 DIAGNOSIS — E669 Obesity, unspecified: Secondary | ICD-10-CM | POA: Diagnosis present

## 2023-06-23 DIAGNOSIS — R9431 Abnormal electrocardiogram [ECG] [EKG]: Secondary | ICD-10-CM | POA: Diagnosis present

## 2023-06-23 DIAGNOSIS — F32A Depression, unspecified: Secondary | ICD-10-CM | POA: Diagnosis present

## 2023-06-23 LAB — RESP PANEL BY RT-PCR (RSV, FLU A&B, COVID)  RVPGX2
Influenza A by PCR: NEGATIVE
Influenza B by PCR: NEGATIVE
Resp Syncytial Virus by PCR: NEGATIVE
SARS Coronavirus 2 by RT PCR: NEGATIVE

## 2023-06-23 LAB — CBC
HCT: 40.6 % (ref 36.0–46.0)
Hemoglobin: 13.6 g/dL (ref 12.0–15.0)
MCH: 33.9 pg (ref 26.0–34.0)
MCHC: 33.5 g/dL (ref 30.0–36.0)
MCV: 101.2 fL — ABNORMAL HIGH (ref 80.0–100.0)
Platelets: 234 10*3/uL (ref 150–400)
RBC: 4.01 MIL/uL (ref 3.87–5.11)
RDW: 11.9 % (ref 11.5–15.5)
WBC: 5.4 10*3/uL (ref 4.0–10.5)
nRBC: 0 % (ref 0.0–0.2)

## 2023-06-23 LAB — COMPREHENSIVE METABOLIC PANEL
ALT: 27 U/L (ref 0–44)
AST: 23 U/L (ref 15–41)
Albumin: 4.2 g/dL (ref 3.5–5.0)
Alkaline Phosphatase: 68 U/L (ref 38–126)
Anion gap: 9 (ref 5–15)
BUN: 9 mg/dL (ref 8–23)
CO2: 32 mmol/L (ref 22–32)
Calcium: 9.7 mg/dL (ref 8.9–10.3)
Chloride: 99 mmol/L (ref 98–111)
Creatinine, Ser: 0.63 mg/dL (ref 0.44–1.00)
GFR, Estimated: >60 mL/min (ref >60)
Glucose, Bld: 91 mg/dL (ref 70–99)
Potassium: 3.7 mmol/L (ref 3.5–5.1)
Sodium: 140 mmol/L (ref 135–145)
Total Bilirubin: 0.6 mg/dL (ref 0.3–1.2)
Total Protein: 7 g/dL (ref 6.5–8.1)

## 2023-06-23 LAB — I-STAT VENOUS BLOOD GAS, ED
Acid-Base Excess: 8 mmol/L — ABNORMAL HIGH (ref 0.0–2.0)
Bicarbonate: 33.7 mmol/L — ABNORMAL HIGH (ref 20.0–28.0)
Calcium, Ion: 1.25 mmol/L (ref 1.15–1.40)
HCT: 37 % (ref 36.0–46.0)
Hemoglobin: 12.6 g/dL (ref 12.0–15.0)
O2 Saturation: 59 %
Potassium: 3.6 mmol/L (ref 3.5–5.1)
Sodium: 138 mmol/L (ref 135–145)
TCO2: 35 mmol/L — ABNORMAL HIGH (ref 22–32)
pCO2, Ven: 52.3 mmHg (ref 44–60)
pH, Ven: 7.417 (ref 7.25–7.43)
pO2, Ven: 31 mmHg — CL (ref 32–45)

## 2023-06-23 LAB — URINALYSIS, ROUTINE W REFLEX MICROSCOPIC
Bilirubin Urine: NEGATIVE
Glucose, UA: NEGATIVE mg/dL
Hgb urine dipstick: NEGATIVE
Ketones, ur: NEGATIVE mg/dL
Leukocytes,Ua: NEGATIVE
Nitrite: NEGATIVE
Protein, ur: NEGATIVE mg/dL
Specific Gravity, Urine: 1.014 (ref 1.005–1.030)
pH: 7.5 (ref 5.0–8.0)

## 2023-06-23 LAB — T4, FREE: Free T4: 0.8 ng/dL (ref 0.61–1.12)

## 2023-06-23 LAB — TSH: TSH: 0.567 u[IU]/mL (ref 0.350–4.500)

## 2023-06-23 LAB — AMMONIA: Ammonia: 21 umol/L (ref 9–35)

## 2023-06-23 MED ORDER — IOHEXOL 300 MG/ML  SOLN
100.0000 mL | Freq: Once | INTRAMUSCULAR | Status: AC | PRN
  Administered 2023-06-23: 85 mL via INTRAVENOUS

## 2023-06-23 NOTE — ED Provider Notes (Signed)
[place] EMERGENCY DEPARTMENT AT [facility] Provider Note   CSN: [id number] Arrival date & time: 06/23/23  1753     History  Chief Complaint  Patient presents with   Altered Mental Status    Jessica [name] is a 82 y.o. female past medical history significant for obesity, hypertension, GERD, hypothyroidism who presents with concern for altered mental status.  Patient lives at [facility], but is normally alert and oriented, they endorse some confusion and has been in bed all day.  Somewhat disoriented to time at this time.  Endorsed some lower abdominal pain, urinary frequency.  She is on Keflex chronically for UTI prophylaxis.  Denies any recent falls. Stroke last year with some right sided deficits   Altered Mental Status      Home Medications Prior to Admission medications   Medication Sig Start Date End Date Taking? Authorizing Provider  acetaminophen (TYLENOL) 500 MG tablet Take 500 mg by mouth every 6 (six) hours as needed.    [provider]  ascorbic acid (VITAMIN C) 500 MG tablet Take 1 tablet (500 mg total) by mouth daily. 06/05/22   Drema Dallas, MD  aspirin 81 MG chewable tablet Chew 1 tablet (81 mg total) by mouth daily. 03/24/22   Danford, Earl Lites, MD  atorvastatin (LIPITOR) 80 MG tablet Take 1 tablet (80 mg total) by mouth at bedtime. Patient taking differently: Take 80 mg by mouth daily. 03/30/22   Angiulli, Mcarthur Rossetti, PA-C  cephALEXin (KEFLEX) 250 MG capsule Take 250 mg by mouth daily. 06/17/22   [provider]  cholecalciferol (VITAMIN D3) 25 MCG (1000 UNIT) tablet Take 1 tablet (1,000 Units total) by mouth daily. 03/30/22   Angiulli, Mcarthur Rossetti, PA-C  ciclopirox (PENLAC) 8 % solution APPLY TOPICALLY TO AFFECTED NAIL(S) AT BEDTIME. APPLY OVER NAIL & SURROUNDING SKIN. APPLY DAILY OVER PREVIOUS COAT. **AFTER 7 DAYS, MAY REMOVE WITH ALCOHOL AND CONTINUE CYCLE.** 06/15/23   Vivi Barrack, DPM  Cranberry 500 MG CHEW Chew 1,000 mg by  mouth daily.    [provider]  diclofenac Sodium (VOLTAREN) 1 % GEL Apply 2 g topically 4 (four) times daily as needed (shoulder pain). 03/30/22   Angiulli, Mcarthur Rossetti, PA-C  estradiol (ESTRACE) 0.1 MG/GM vaginal cream Place vaginally. 09/24/22   [provider]  ferrous sulfate 325 (65 FE) MG tablet Take 1 tablet (325 mg total) by mouth daily with breakfast. 06/05/22   Drema Dallas, MD  fexofenadine (ALLEGRA) 180 MG tablet Take 180 mg by mouth daily.    [provider]  folic acid (FOLVITE) 1 MG tablet Take 1 mg by mouth 2 (two) times daily. 09/16/22   [provider]  hydrochlorothiazide (HYDRODIURIL) 25 MG tablet Take 25 mg by mouth daily.    [provider]  hydrocortisone (ANUSOL-HC) 2.5 % rectal cream Apply 1 Application topically 2 (two) times daily. 06/17/22   [provider]  L-Lysine 1000 MG TABS Take 1,000 mg by mouth as needed. As needed for fever blister    [provider]  lactulose (CHRONULAC) 10 GM/15ML solution Take 45 mLs (30 g total) by mouth 2 (two) times daily as needed for mild constipation or moderate constipation. 06/12/22   Dorcas Carrow, MD  levothyroxine (SYNTHROID) 75 MCG tablet Take 1 tablet (75 mcg total) by mouth daily. 03/30/22   Angiulli, Mcarthur Rossetti, PA-C  losartan (COZAAR) 50 MG tablet Take 50 mg by mouth daily.    [provider]  MAGNESIUM Nida Boatman  PO Take 1 each by mouth daily. gummy    [provider]  Multiple Vitamins-Minerals (CENTRUM ADULTS MULTIGUMMIES PO) Take 2 each by mouth daily.    [provider]  Multiple Vitamins-Minerals (OCUVITE ADULT FORMULA PO) Take 1 tablet by mouth daily.    [provider]  nystatin powder Apply 1 Application topically 3 (three) times daily. 60 g to groin folds twice daily    [provider]  OLANZapine (ZYPREXA) 2.5 MG tablet Take 2.5 mg by mouth daily.    [provider]  omeprazole (PRILOSEC) 20 MG capsule Take  20 mg by mouth 2 (two) times daily.    [provider]  pantoprazole (PROTONIX) 40 MG tablet Take 1 tablet (40 mg total) by mouth 2 (two) times daily. 06/05/22   Drema Dallas, MD  potassium chloride (KLOR-CON) 10 MEQ tablet Take 10 mEq by mouth 2 (two) times daily. 09/14/22   [provider]  venlafaxine XR (EFFEXOR-XR) 75 MG 24 hr capsule Take 3 capsules (225 mg total) by mouth daily. 03/30/22   Angiulli, Mcarthur Rossetti, PA-C  Vitamin D, Ergocalciferol, (DRISDOL) 1.25 MG (50000 UNIT) CAPS capsule Take 1 capsule (50,000 Units total) by mouth every 7 (seven) days. 10/05/22   Raulkar, Drema Pry, MD      Allergies    Advil [ibuprofen], Codeine, Crestor [rosuvastatin calcium], Penicillins, Sulfamethoxazole-trimethoprim, Bacitracin-polymyxin b, Benazepril, and Other    Review of Systems   Review of Systems  Reason unable to perform ROS: AMS.    Physical Exam Updated Vital Signs BP 127/83 (BP Location: Left Wrist)   Pulse 64   Temp (!) 97.4 F (36.3 C) (Oral)   Resp 13   Wt 83.9 kg   SpO2 91%   BMI 32.77 kg/m  Physical Exam Vitals and nursing note reviewed.  Constitutional:      General: She is not in acute distress.    Appearance: Normal appearance.  HENT:     Head: Normocephalic and atraumatic.  Eyes:     General:        Right eye: No discharge.        Left eye: No discharge.  Cardiovascular:     Rate and Rhythm: Normal rate and regular rhythm.     Heart sounds: No murmur heard.    No friction rub. No gallop.  Pulmonary:     Effort: Pulmonary effort is normal.     Breath sounds: Normal breath sounds.  Abdominal:     General: Bowel sounds are normal.     Palpations: Abdomen is soft.     Comments: Mild ttp of lower abdomen, no rebound, rigidity, guarding  Skin:    General: Skin is warm and dry.     Capillary Refill: Capillary refill takes less than 2 seconds.  Neurological:     Mental Status: She is alert.     Comments: Not oriented to time but oriented to  self, place CN 2-12 grossly intact. Moving all 4 limbs spontaneous, no ataxia noted on finger nose finger Some instability with standing, did not attempt to assess gait at this time  Psychiatric:        Mood and Affect: Mood normal.        Behavior: Behavior normal.     ED Results / Procedures / Treatments   Labs (all labs ordered are listed, but only abnormal results are displayed) Labs Reviewed  CBC - Abnormal; Notable for the following components:      Result Value  MCV 101.2 (*)    All other components within normal limits  I-STAT VENOUS BLOOD GAS, ED - Abnormal; Notable for the following components:   pO2, Ven 31 (*)    Bicarbonate 33.7 (*)    TCO2 35 (*)    Acid-Base Excess 8.0 (*)    All other components within normal limits  RESP PANEL BY RT-PCR (RSV, FLU A&B, COVID)  [id number]  COMPREHENSIVE METABOLIC PANEL  URINALYSIS, ROUTINE W REFLEX MICROSCOPIC  TSH  T4, FREE  AMMONIA  CBG MONITORING, ED    EKG EKG Interpretation Date/Time:  Wednesday June 23 2023 18:23:24 EDT Ventricular Rate:  68 PR Interval:  152 QRS Duration:  155 QT Interval:  476 QTC Calculation: 507 R Axis:   27  Text Interpretation: Sinus rhythm Consider left atrial enlargement Right bundle branch block Confirmed by Ernie Avena ([id number]) on 06/23/2023 7:32:00 PM  Radiology CT ABDOMEN PELVIS W CONTRAST  Result Date: 06/23/2023 CLINICAL DATA:  Abdominal pain, acute, nonlocalized. EXAM: CT ABDOMEN AND PELVIS WITH CONTRAST TECHNIQUE: Multidetector CT imaging of the abdomen and pelvis was performed using the standard protocol following bolus administration of intravenous contrast. RADIATION DOSE REDUCTION: This exam was performed according to the departmental dose-optimization program which includes automated exposure control, adjustment of the mA and/or kV according to patient size and/or use of iterative reconstruction technique. CONTRAST:  85mL OMNIPAQUE IOHEXOL 300 MG/ML  SOLN COMPARISON:  06/10/2022  FINDINGS: Lower chest: No acute abnormality Hepatobiliary: No focal hepatic abnormality. Gallbladder unremarkable. Pancreas: No focal abnormality or ductal dilatation. Spleen: No focal abnormality.  Normal size. Adrenals/Urinary Tract: No adrenal abnormality. No focal renal abnormality. No stones or hydronephrosis. Urinary bladder is unremarkable. Stomach/Bowel: Stomach, large and small bowel grossly unremarkable. Vascular/Lymphatic: No evidence of aneurysm or adenopathy. Aortic atherosclerosis. Reproductive: Prior hysterectomy.  No adnexal masses. Other: No free fluid or free air. Musculoskeletal: No acute bony abnormality. IMPRESSION: No acute findings in the abdomen or pelvis. Aortic atherosclerosis. Electronically Signed   By: Charlett Nose M.D.   On: 06/23/2023 22:22   CT Head Wo Contrast  Result Date: 06/23/2023 CLINICAL DATA:  Mental status change EXAM: CT HEAD WITHOUT CONTRAST TECHNIQUE: Contiguous axial images were obtained from the base of the skull through the vertex without intravenous contrast. RADIATION DOSE REDUCTION: This exam was performed according to the departmental dose-optimization program which includes automated exposure control, adjustment of the mA and/or kV according to patient size and/or use of iterative reconstruction technique. COMPARISON:  Head CT 04/07/2023 FINDINGS: Brain: No evidence of acute infarction, hemorrhage, hydrocephalus, extra-axial collection or mass lesion/mass effect. Again seen is mild diffuse atrophy. There is an old lacunar infarct in the left corona radiata and right basal ganglia. Vascular: Atherosclerotic calcifications are present within the cavernous internal carotid arteries. Skull: Normal. Negative for fracture or focal lesion. Sinuses/Orbits: No acute finding. Other: None. IMPRESSION: 1. No acute intracranial process. 2. Mild diffuse atrophy. 3. Old lacunar infarcts in the left corona radiata and right basal ganglia. Electronically Signed   By: Darliss Cheney M.D.   On: 06/23/2023 20:42   DG Chest Portable 1 View  Result Date: 06/23/2023 CLINICAL DATA:  Altered mental status. EXAM: PORTABLE CHEST 1 VIEW COMPARISON:  Mar 07, 2018 FINDINGS: There is stable right-sided venous Port-A-Cath positioning. The heart size and mediastinal contours are within normal limits. There is no evidence of an acute infiltrate, pleural effusion or pneumothorax. Radiopaque surgical clips are seen within the soft tissues of the left breast. Degenerative changes seen throughout  the thoracic spine. IMPRESSION: No active cardiopulmonary disease. Electronically Signed   By: Aram Candela M.D.   On: 06/23/2023 20:17    Procedures Procedures    Medications Ordered in ED Medications  iohexol (OMNIPAQUE) 300 MG/ML solution 100 mL (85 mLs Intravenous Contrast Given 06/23/23 2120)    ED Course/ Medical Decision Making/ A&P                                 Medical Decision Making Amount and/or Complexity of Data Reviewed Labs: ordered.   This patient is a 82 y.o. female who presents to the ED for concern of altered mental status, this involves an extensive number of treatment options, and is a complaint that carries with it a high risk of complications and morbidity. The emergent differential diagnosis prior to evaluation includes, but is not limited to,  CVA, spinal cord injury, ACS, arrhythmia, syncope, orthostatic hypotension, sepsis, hypoglycemia, hypoxia, electrolyte disturbance, endocrine disorder, anemia, environmental exposure, polypharmacy . This is not an exhaustive differential.   Past Medical History / Co-morbidities / Social History: obesity, hypertension, GERD, hypothyroidism, breast cancer  Additional history: Chart reviewed. Pertinent results include: reviewed labwork, imaging from previous ED evaluations, outpatient oncology visits  Physical Exam: Physical exam performed. The pertinent findings include: Mild ttp of lower abdomen, no rebound,  rigidity, guarding  Not oriented to time but oriented to self, place CN 2-12 grossly intact. Moving all 4 limbs spontaneous, no ataxia noted on finger nose finger Some instability with standing, did not attempt to assess gait at this time   Lab Tests: I ordered, and personally interpreted labs.  The pertinent results include: CBC unremarkable, CMP unremarkable, UA unremarkable, normal TSH, ammonia, free T4, RVP negative for COVID, RSV, no evidence of acidemia on VBG   Imaging Studies: I ordered imaging studies including CT abdomen pelvis with contrast, CT head without contrast, plain film chest x-ray. I independently visualized and interpreted imaging which showed remote lacunar infarcts noted in CT, no clear acute stroke noted, no acute intraabdominal abnormality. I agree with the radiologist interpretation.   Cardiac Monitoring:  The patient was maintained on a cardiac monitor.  My attending physician Dr. Karene Fry viewed and interpreted the cardiac monitored which showed an underlying rhythm of: NSR, RBBB. I agree with this interpretation.  Consultations Obtained: I requested consultation with the hospitalist, spoke with Dr. Antionette Char,  and discussed lab and imaging findings as well as pertinent plan - they recommend: admission for acute encephalopathy -- can't rule out ongoing stroke, MRI may be warranted if symptoms persist with no clear etiology   Disposition: After consideration of the diagnostic results and the patients response to treatment, I feel that patient would benefit from admission as discussed above .   I discussed this case with my attending physician Dr. Karene Fry who cosigned this note including patient's presenting symptoms, physical exam, and planned diagnostics and interventions. Attending physician stated agreement with plan or made changes to plan which were implemented.  Attending physician assessed patient at bedside.  Final Clinical Impression(s) / ED Diagnoses Final  diagnoses:  None    Rx / DC Orders ED Discharge Orders     None         Olene Floss, PA-C 06/24/23 0000    Ernie Avena, MD 06/24/23 [phone]

## 2023-06-23 NOTE — ED Notes (Signed)
Patient transported to CT 

## 2023-06-23 NOTE — Progress Notes (Signed)
Plan of Care Note for accepted transfer   Patient: Jessica [name] MRN: [id number]   DOA: 06/23/2023  Facility requesting transfer: [facility]   Requesting Provider: Luther Hearing, PA   Reason for transfer: AMS   Facility course: 82 year old female with history of HTN, HLD, CVA, depression, and anxiety who presents with 1 day of confusion and lethargy.   She is afebrile with stable vitals, normal ammonia and TSH, unremarkable urine, negative respiratory virus panel, negative chest x-ray, had negative CTs of the head and abdomen/pelvis.  EKG notable for prolonged QT interval.  Plan of care: The patient is accepted for admission to Telemetry unit, at [facility].   Author: Briscoe Deutscher, MD 06/23/2023  Check [email or web address] for on-call coverage.  Nursing staff, Please call [facility] Admits & Consults System-Wide number on Amion as soon as patient's arrival, so appropriate admitting provider can evaluate the pt.

## 2023-06-23 NOTE — ED Triage Notes (Signed)
Pt arrives pov with daughter, arrives from [facility], states pt has been in bed all day and has been confused. Pt ao x 3, disoriented to time. Pt reports lower abd pain, urinary frequency. LKW 8/20 by daughter.

## 2023-06-24 ENCOUNTER — Observation Stay (HOSPITAL_COMMUNITY): Payer: Medicare Other

## 2023-06-24 DIAGNOSIS — G9340 Encephalopathy, unspecified: Secondary | ICD-10-CM | POA: Diagnosis not present

## 2023-06-24 DIAGNOSIS — Z7982 Long term (current) use of aspirin: Secondary | ICD-10-CM | POA: Diagnosis not present

## 2023-06-24 DIAGNOSIS — Z8673 Personal history of transient ischemic attack (TIA), and cerebral infarction without residual deficits: Secondary | ICD-10-CM | POA: Diagnosis not present

## 2023-06-24 DIAGNOSIS — I129 Hypertensive chronic kidney disease with stage 1 through stage 4 chronic kidney disease, or unspecified chronic kidney disease: Secondary | ICD-10-CM | POA: Diagnosis not present

## 2023-06-24 DIAGNOSIS — N189 Chronic kidney disease, unspecified: Secondary | ICD-10-CM | POA: Diagnosis not present

## 2023-06-24 DIAGNOSIS — I6381 Other cerebral infarction due to occlusion or stenosis of small artery: Secondary | ICD-10-CM | POA: Diagnosis not present

## 2023-06-24 DIAGNOSIS — E039 Hypothyroidism, unspecified: Secondary | ICD-10-CM | POA: Diagnosis not present

## 2023-06-24 DIAGNOSIS — K5900 Constipation, unspecified: Secondary | ICD-10-CM | POA: Diagnosis not present

## 2023-06-24 DIAGNOSIS — R9431 Abnormal electrocardiogram [ECG] [EKG]: Secondary | ICD-10-CM | POA: Diagnosis not present

## 2023-06-24 DIAGNOSIS — G9389 Other specified disorders of brain: Secondary | ICD-10-CM | POA: Diagnosis not present

## 2023-06-24 DIAGNOSIS — Z853 Personal history of malignant neoplasm of breast: Secondary | ICD-10-CM | POA: Diagnosis not present

## 2023-06-24 DIAGNOSIS — I4581 Long QT syndrome: Secondary | ICD-10-CM | POA: Diagnosis not present

## 2023-06-24 DIAGNOSIS — R4182 Altered mental status, unspecified: Secondary | ICD-10-CM | POA: Diagnosis present

## 2023-06-24 DIAGNOSIS — I10 Essential (primary) hypertension: Secondary | ICD-10-CM | POA: Diagnosis not present

## 2023-06-24 DIAGNOSIS — E785 Hyperlipidemia, unspecified: Secondary | ICD-10-CM | POA: Diagnosis not present

## 2023-06-24 DIAGNOSIS — Z471 Aftercare following joint replacement surgery: Secondary | ICD-10-CM | POA: Diagnosis not present

## 2023-06-24 DIAGNOSIS — Z7722 Contact with and (suspected) exposure to environmental tobacco smoke (acute) (chronic): Secondary | ICD-10-CM | POA: Diagnosis not present

## 2023-06-24 DIAGNOSIS — J45909 Unspecified asthma, uncomplicated: Secondary | ICD-10-CM | POA: Diagnosis not present

## 2023-06-24 DIAGNOSIS — E669 Obesity, unspecified: Secondary | ICD-10-CM

## 2023-06-24 DIAGNOSIS — Z1152 Encounter for screening for COVID-19: Secondary | ICD-10-CM | POA: Diagnosis not present

## 2023-06-24 DIAGNOSIS — F32A Depression, unspecified: Secondary | ICD-10-CM

## 2023-06-24 DIAGNOSIS — Z79899 Other long term (current) drug therapy: Secondary | ICD-10-CM | POA: Diagnosis not present

## 2023-06-24 LAB — CBC
HCT: 38.8 % (ref 36.0–46.0)
Hemoglobin: 13.1 g/dL (ref 12.0–15.0)
MCH: 33.4 pg (ref 26.0–34.0)
MCHC: 33.8 g/dL (ref 30.0–36.0)
MCV: 99 fL (ref 80.0–100.0)
Platelets: 229 10*3/uL (ref 150–400)
RBC: 3.92 MIL/uL (ref 3.87–5.11)
RDW: 12.1 % (ref 11.5–15.5)
WBC: 5 10*3/uL (ref 4.0–10.5)
nRBC: 0 % (ref 0.0–0.2)

## 2023-06-24 LAB — BASIC METABOLIC PANEL
Anion gap: 14 (ref 5–15)
BUN: 8 mg/dL (ref 8–23)
CO2: 28 mmol/L (ref 22–32)
Calcium: 9.5 mg/dL (ref 8.9–10.3)
Chloride: 97 mmol/L — ABNORMAL LOW (ref 98–111)
Creatinine, Ser: 0.75 mg/dL (ref 0.44–1.00)
GFR, Estimated: >60 mL/min (ref >60)
Glucose, Bld: 149 mg/dL — ABNORMAL HIGH (ref 70–99)
Potassium: 4 mmol/L (ref 3.5–5.1)
Sodium: 139 mmol/L (ref 135–145)

## 2023-06-24 LAB — GLUCOSE, CAPILLARY: Glucose-Capillary: 139 mg/dL — ABNORMAL HIGH (ref 70–99)

## 2023-06-24 LAB — CBG MONITORING, ED: Glucose-Capillary: 89 mg/dL (ref 70–99)

## 2023-06-24 MED ORDER — SENNOSIDES-DOCUSATE SODIUM 8.6-50 MG PO TABS: 1.0000 | ORAL_TABLET | Freq: Every day | ORAL | Status: DC

## 2023-06-24 MED ORDER — ALBUTEROL SULFATE (2.5 MG/3ML) 0.083% IN NEBU: 2.5000 mg | INHALATION_SOLUTION | Freq: Four times a day (QID) | RESPIRATORY_TRACT | Status: DC | PRN

## 2023-06-24 MED ORDER — NYSTATIN 100000 UNIT/GM EX POWD
1.0000 | Freq: Two times a day (BID) | CUTANEOUS | Status: DC
  Administered 2023-06-24 – 2023-06-25 (×2): 1 via TOPICAL
  Filled 2023-06-24: qty 15

## 2023-06-24 MED ORDER — LOSARTAN POTASSIUM 50 MG PO TABS
50.0000 mg | ORAL_TABLET | Freq: Every day | ORAL | Status: DC
  Administered 2023-06-24 – 2023-06-25 (×2): 50 mg via ORAL
  Filled 2023-06-24 (×2): qty 1

## 2023-06-24 MED ORDER — OLANZAPINE 5 MG PO TABS
2.5000 mg | ORAL_TABLET | Freq: Every day | ORAL | Status: DC
  Administered 2023-06-24: 2.5 mg via ORAL
  Filled 2023-06-24: qty 1

## 2023-06-24 MED ORDER — ASPIRIN 81 MG PO CHEW
81.0000 mg | CHEWABLE_TABLET | Freq: Every day | ORAL | Status: DC
  Administered 2023-06-24 – 2023-06-25 (×2): 81 mg via ORAL
  Filled 2023-06-24 (×2): qty 1

## 2023-06-24 MED ORDER — LEVOTHYROXINE SODIUM 75 MCG PO TABS
75.0000 ug | ORAL_TABLET | Freq: Every morning | ORAL | Status: DC
  Administered 2023-06-25: 75 ug via ORAL
  Filled 2023-06-24: qty 1

## 2023-06-24 MED ORDER — VENLAFAXINE HCL ER 75 MG PO CP24
225.0000 mg | ORAL_CAPSULE | Freq: Every day | ORAL | Status: DC
  Administered 2023-06-24 – 2023-06-25 (×2): 225 mg via ORAL
  Filled 2023-06-24 (×2): qty 3

## 2023-06-24 MED ORDER — PANTOPRAZOLE SODIUM 40 MG PO TBEC
40.0000 mg | DELAYED_RELEASE_TABLET | Freq: Every day | ORAL | Status: DC
  Administered 2023-06-24 – 2023-06-25 (×2): 40 mg via ORAL
  Filled 2023-06-24 (×2): qty 1

## 2023-06-24 MED ORDER — ACETAMINOPHEN 650 MG RE SUPP: 650.0000 mg | Freq: Four times a day (QID) | RECTAL | Status: DC | PRN

## 2023-06-24 MED ORDER — SENNA 8.6 MG PO TABS
1.0000 | ORAL_TABLET | Freq: Every day | ORAL | Status: DC
  Administered 2023-06-24 – 2023-06-25 (×2): 8.6 mg via ORAL
  Filled 2023-06-24 (×2): qty 1

## 2023-06-24 MED ORDER — LORAZEPAM 2 MG/ML IJ SOLN
0.5000 mg | INTRAMUSCULAR | Status: DC | PRN
  Administered 2023-06-24: 1 mg via INTRAVENOUS
  Filled 2023-06-24: qty 1

## 2023-06-24 MED ORDER — CICLOPIROX 8 % EX SOLN: Freq: Every day | CUTANEOUS | Status: DC

## 2023-06-24 MED ORDER — ONDANSETRON HCL 4 MG PO TABS: 4.0000 mg | ORAL_TABLET | Freq: Four times a day (QID) | ORAL | Status: DC | PRN

## 2023-06-24 MED ORDER — DOCUSATE SODIUM 100 MG PO CAPS
100.0000 mg | ORAL_CAPSULE | Freq: Two times a day (BID) | ORAL | Status: DC
  Administered 2023-06-24 – 2023-06-25 (×2): 100 mg via ORAL
  Filled 2023-06-24 (×3): qty 1

## 2023-06-24 MED ORDER — ACETAMINOPHEN 325 MG PO TABS: 650.0000 mg | ORAL_TABLET | Freq: Four times a day (QID) | ORAL | Status: DC | PRN

## 2023-06-24 MED ORDER — ONDANSETRON HCL 4 MG/2ML IJ SOLN: 4.0000 mg | Freq: Four times a day (QID) | INTRAMUSCULAR | Status: DC | PRN

## 2023-06-24 MED ORDER — ENOXAPARIN SODIUM 40 MG/0.4ML IJ SOSY
40.0000 mg | PREFILLED_SYRINGE | INTRAMUSCULAR | Status: DC
  Administered 2023-06-24: 40 mg via SUBCUTANEOUS
  Filled 2023-06-24: qty 0.4

## 2023-06-24 MED ORDER — TRIMETHOBENZAMIDE HCL 100 MG/ML IM SOLN: 200.0000 mg | Freq: Four times a day (QID) | INTRAMUSCULAR | Status: DC | PRN

## 2023-06-24 MED ORDER — ATORVASTATIN CALCIUM 80 MG PO TABS
80.0000 mg | ORAL_TABLET | Freq: Every day | ORAL | Status: DC
  Administered 2023-06-24: 80 mg via ORAL
  Filled 2023-06-24: qty 1

## 2023-06-24 MED ORDER — LORATADINE 10 MG PO TABS
10.0000 mg | ORAL_TABLET | Freq: Every day | ORAL | Status: DC
  Administered 2023-06-24 – 2023-06-25 (×2): 10 mg via ORAL
  Filled 2023-06-24 (×2): qty 1

## 2023-06-24 NOTE — H&P (Signed)
History and Physical    Patient: Jessica [name] FUX:[id number] DOB: Feb 18, 1941 DOA: 06/23/2023 DOS: the patient was seen and examined on 06/24/2023 PCP: Merri Brunette, MD  Patient coming from:   Chief Complaint:  Chief Complaint  Patient presents with   Altered Mental Status   HPI: Jessica [name] is a 82 y.o. female with medical history significant of hypertension, dyslipidemia, CVA without significant reported deficit, hypothyroidism, and history of left breast cancer who presents after being noted to be confused yesterday.  Most of history is obtained from the patient's daughter is present at bedside.  Normally patient is alert and oriented and gets around with use of a rolling walker.  Patient reportedly had not eaten breakfast, lunch, or dinner which was unusual as she normally only skips 1 meal throughout the day.  Family reported that she kept asking repetitive questions, called a pencil a pen, and could not remember her full birthday which were all out of the ordinary for her.  No focal deficits were reported.  She has also been constipated, but family notes there have been no other recent changes.  They question if she had a urinary tract infection as the cause of her symptoms but also question if she possibly had had a stroke.  Patient had been seen by neurology for involuntary movements back on 04/09/2023 with concern for possible seizure-like activity.  Workup included MRI of the brain  and EEG.which did not note any acute pathology.  Family makes note patient had similar issues with altered mental status previously when she was diagnosed with a acute stroke back in 03/2022.  In the emergency department patient was noted to be afebrile with O2 saturations as low as 89% with improvement on 2 L of nasal cannula oxygen. Labs from 8/21 including CBC, CMP, ammonia, and thyroid studies were within normal limits.  Venous blood gas was within normal limits.  Chest x-ray showed no acute  abnormality.  Urinalysis showed no signs of infection.  CT scan of the brain noted no acute intracranial process with mild diffuse atrophy and old lacunar infarcts of the left corona radiata and right basal ganglia.  Influenza, COVID-19, and RSV screening were negative.  CT scan also was done of the abdomen and pelvis that did not note any acute abnormality.    Review of Systems: As mentioned in the history of present illness. All other systems reviewed and are negative. Past Medical History:  Diagnosis Date   Allergy    Anemia    Anxiety    Asthma    in past, no inhalers now   Blood transfusion without reported diagnosis    Breast cancer (HCC) 01/2017   left breast    Breast cancer of upper-inner quadrant of left female breast (HCC) 03/11/2017   Cancer (HCC) 01/2017   left breast   Cataract    bilateral removed   Chronic kidney disease    kidney stones   Depression 08/31/2013   Dyslipidemia, goal LDL below 160 08/31/2013   Essential hypertension - well controlled 08/31/2013   Family history of breast cancer    Family history of melanoma    Family history of prostate cancer    GERD (gastroesophageal reflux disease)    Goals of care, counseling/discussion 03/11/2017   History of radiation therapy 10/13/17-11/11/17   left breast 40.05 Gy in 15 fractions, left breast boost 10 Gy in 5 fractions   Hypothyroidism    Ischemic stroke [facility])    May 2023;  Obesity (BMI 30-39.9) 08/31/2013   Personal history of chemotherapy 2018   Personal history of radiation therapy 2019   Thyroid disease    Vasovagal near-syncope -rare 08/31/2013   Past Surgical History:  Procedure Laterality Date   ABDOMINAL HYSTERECTOMY     total   BREAST BIOPSY Left 02/24/2017    malignant   BREAST LUMPECTOMY Left 03/23/2017   broken fingers     CATARACT EXTRACTION Bilateral    COLONOSCOPY     COLONOSCOPY WITH PROPOFOL N/A 06/03/2022   Procedure: COLONOSCOPY WITH PROPOFOL;  Surgeon: Jenel Lucks,  MD;  Location: [facility];  Service: Gastroenterology;  Laterality: N/A;   ESOPHAGOGASTRODUODENOSCOPY (EGD) WITH PROPOFOL N/A 05/29/2022   Procedure: ESOPHAGOGASTRODUODENOSCOPY (EGD) WITH PROPOFOL;  Surgeon: Beverley Fiedler, MD;  Location: [facility];  Service: Gastroenterology;  Laterality: N/A;   EXCISIONAL HEMORRHOIDECTOMY     IR FLUORO GUIDE PORT INSERTION RIGHT  08/04/2017   IR REMOVAL TUN ACCESS W/ PORT W/O FL MOD SED  08/04/2017   IR US GUIDE VASC ACCESS RIGHT  08/04/2017   IRRIGATION AND DEBRIDEMENT ABSCESS Left 05/18/2017   Procedure: IRRIGATION AND DEBRIDEMENT LEFT AXILLARY ABSCESS;  Surgeon: Karie Soda, MD;  Location: [facility];  Service: General;  Laterality: Left;   kidney stones lithotripsy     POLYPECTOMY  06/03/2022   Procedure: POLYPECTOMY;  Surgeon: Jenel Lucks, MD;  Location: [facility];  Service: Gastroenterology;;   PORTACATH PLACEMENT Right 03/23/2017   Procedure: INSERTION PORT-A-CATH;  Surgeon: Harriette Bouillon, MD;  Location: [facility];  Service: General;  Laterality: Right;   RADIOACTIVE SEED GUIDED PARTIAL MASTECTOMY WITH AXILLARY SENTINEL LYMPH NODE BIOPSY Left 03/23/2017   Procedure: LEFT BREAST RADIOACTIVE SEED GUIDED PARTIAL MASTECTOMY AND  SENTINEL LYMPH NODE MAPPING;  Surgeon: Harriette Bouillon, MD;  Location: [facility];  Service: General;  Laterality: Left;   ROTATOR CUFF REPAIR     left    TONSILLECTOMY     WISDOM TOOTH EXTRACTION     Social History:  reports that she has never smoked. She has been exposed to tobacco smoke. She has never used smokeless tobacco. She reports that she does not drink alcohol and does not use drugs.  Allergies  Allergen Reactions   Benazepril Anaphylaxis    Possible cause of tongue swelling   Penicillins Anaphylaxis    Has patient had a PCN reaction causing immediate rash, facial/tongue/throat swelling, SOB or lightheadedness with hypotension: yes, Has patient had a PCN reaction causing severe  rash involving mucus membranes or skin necrosis:  no, Has patient had a PCN reaction that required hospitalization:  no, Has patient had a PCN reaction occurring within the last 10 years: no, If all of the above answers are "NO", then may proceed with Cephalosporin use.   Advil [Ibuprofen] Itching and Rash    Rash that turns to blisters   Codeine Hives    hives   Crestor [Rosuvastatin Calcium] Other (See Comments)    Pain in joints   Sulfamethoxazole-Trimethoprim Hives    hives   Bacitracin-Polymyxin B Dermatitis and Rash   Other Rash    Band-aid    Family History  Problem Relation Age of Onset   Heart disease Mother        Angina   Heart disease Father    Heart attack Brother        Died of MI in his mid 79s   Prostate cancer Brother        dx in his  early 69s   Cervical cancer Maternal Aunt    Breast cancer Paternal Aunt        dx over 23   Bone cancer Cousin        maternal first cousin dx in her 33s-60s   Lung cancer Cousin        paternal first cousin   Colon cancer Neg Hx    Pancreatic cancer Neg Hx    Stomach cancer Neg Hx    Esophageal cancer Neg Hx    Rectal cancer Neg Hx    Seizures Neg Hx     Prior to Admission medications   Medication Sig Start Date End Date Taking? Authorizing Provider  acetaminophen (TYLENOL) 500 MG tablet Take 500 mg by mouth every 6 (six) hours as needed.   Yes [provider]  ascorbic acid (VITAMIN C) 500 MG tablet Take 1 tablet (500 mg total) by mouth daily. 06/05/22  Yes Drema Dallas, MD  aspirin 81 MG chewable tablet Chew 1 tablet (81 mg total) by mouth daily. 03/24/22  Yes Danford, Earl Lites, MD  atorvastatin (LIPITOR) 80 MG tablet Take 1 tablet (80 mg total) by mouth at bedtime. 03/30/22  Yes Angiulli, Mcarthur Rossetti, PA-C  cephALEXin (KEFLEX) 250 MG capsule Take 250 mg by mouth daily. 06/17/22  Yes [provider]  cholecalciferol (VITAMIN D3) 25 MCG (1000 UNIT) tablet Take 1 tablet (1,000 Units total) by mouth daily.  03/30/22  Yes Angiulli, Mcarthur Rossetti, PA-C  ciclopirox (PENLAC) 8 % solution APPLY TOPICALLY TO AFFECTED NAIL(S) AT BEDTIME. APPLY OVER NAIL & SURROUNDING SKIN. APPLY DAILY OVER PREVIOUS COAT. **AFTER 7 DAYS, MAY REMOVE WITH ALCOHOL AND CONTINUE CYCLE.** 06/15/23  Yes Vivi Barrack, DPM  Cranberry 500 MG CHEW Chew 1,000 mg by mouth daily.   Yes [provider]  docusate sodium (COLACE) 100 MG capsule Take 100 mg by mouth 2 (two) times daily. 06/15/23  Yes [provider]  estradiol (ESTRACE) 0.1 MG/GM vaginal cream Place vaginally. 09/24/22  Yes [provider]  ferrous sulfate 325 (65 FE) MG tablet Take 1 tablet (325 mg total) by mouth daily with breakfast. 06/05/22  Yes Drema Dallas, MD  fexofenadine (ALLEGRA) 180 MG tablet Take 180 mg by mouth daily.   Yes [provider]  hydrochlorothiazide (HYDRODIURIL) 25 MG tablet Take 25 mg by mouth daily.   Yes [provider]  hydrocortisone (ANUSOL-HC) 2.5 % rectal cream Apply 1 Application topically 2 (two) times daily. 06/17/22  Yes [provider]  L-Lysine 1000 MG TABS Take 1,000 mg by mouth as needed. As needed for fever blister   Yes [provider]  lactulose (CHRONULAC) 10 GM/15ML solution Take 45 mLs (30 g total) by mouth 2 (two) times daily as needed for mild constipation or moderate constipation. 06/12/22  Yes Dorcas Carrow, MD  levothyroxine (SYNTHROID) 75 MCG tablet Take 1 tablet (75 mcg total) by mouth daily. Patient taking differently: Take 75 mcg by mouth every morning. 03/30/22  Yes Angiulli, Mcarthur Rossetti, PA-C  losartan (COZAAR) 50 MG tablet Take 50 mg by mouth daily.   Yes [provider]  MAGNESIUM GLYCINATE PO Take 1 each by mouth daily. gummy   Yes [provider]  Multiple Vitamins-Minerals (CENTRUM ADULTS MULTIGUMMIES PO) Take 2 each by mouth daily.   Yes [provider]  nystatin powder Apply 1 Application topically 2 (two) times daily. 60 g to  groin folds twice daily   Yes [provider]  OLANZapine (  ZYPREXA) 2.5 MG tablet Take 2.5 mg by mouth daily.   Yes [provider]  omeprazole (PRILOSEC) 20 MG capsule Take 20 mg by mouth 2 (two) times daily.   Yes [provider]  potassium chloride (KLOR-CON M) 10 MEQ tablet Take 10 mEq by mouth 2 (two) times daily. 06/15/23  Yes [provider]  venlafaxine XR (EFFEXOR-XR) 75 MG 24 hr capsule Take 3 capsules (225 mg total) by mouth daily. 03/30/22  Yes Angiulli, Mcarthur Rossetti, PA-C  Zinc Oxide 10 % OINT Apply 1 Application topically See admin instructions. Provide and apply to vaginal area 3 times daily as needed for itching,burning,moisture   Yes [provider]  diclofenac Sodium (VOLTAREN) 1 % GEL Apply 2 g topically 4 (four) times daily as needed (shoulder pain). Patient not taking: Reported on 06/24/2023 03/30/22   Angiulli, Mcarthur Rossetti, PA-C  pantoprazole (PROTONIX) 40 MG tablet Take 1 tablet (40 mg total) by mouth 2 (two) times daily. Patient not taking: Reported on 06/24/2023 06/05/22   Drema Dallas, MD  Vitamin D, Ergocalciferol, (DRISDOL) 1.25 MG (50000 UNIT) CAPS capsule Take 1 capsule (50,000 Units total) by mouth every 7 (seven) days. Patient not taking: Reported on 06/24/2023 10/05/22   Horton Chin, MD    Physical Exam: Vitals:   06/24/23 1100 06/24/23 1200 06/24/23 1255 06/24/23 1400  BP: (!) 142/68 (!) 151/69  138/74  Pulse: (!) 58 62  66  Resp:    20  Temp:   98 F (36.7 C) 99.6 F (37.6 C)  TempSrc:   Oral Oral  SpO2: 96% 98%    Weight:       Exam  Constitutional: Early female currently in no acute distress Eyes: PERRL, lids and conjunctivae normal ENMT: Mucous membranes are moist. Fair dentition Neck: normal, supple, no masses, no thyromegaly Respiratory: clear to auscultation bilaterally, no wheezing, no crackles. Normal respiratory effort. No accessory muscle use.  Cardiovascular: Regular rate and rhythm, no murmurs /  rubs / gallops.  Port present of the right upper chest wall.  Trace lower extremity edema. Abdomen: no tenderness, no masses palpated.  Bowel sounds positive.  Musculoskeletal: no clubbing / cyanosis. No joint deformity upper and lower extremities. Good ROM, no contractures. Normal muscle tone.  Skin: no rashes, lesions, ulcers. No induration Neurologic: CN 2-12 grossly intact. Sensation intact, DTR normal. Strength 5/5 in all 4.  Psychiatric: Patient appears alert and oriented to person and place.  Patient previously reported year was 2022, but corrected that after given some time to 2024.  Flat affect.  Data Reviewed:  EKG reveals sinus rhythm at 60 bpm with QTc prolonged at 507.  Reviewed labs, imaging, and pertinent document is as noted.  Assessment and Plan:  Acute encephalopathy Patient presents after being noted to be acutely confused with repetitive questioning and inability to recall her birthdate.  Workup was negativeg CBC, CMP, ammonia, thyroid studies, urinalysis, UDS, chest x-ray, CT of the head, and CT of the abdomen pelvis.  Unclear cause of patient's symptoms currently at this time.  Question possibility of TIA/stroke, seizure, dehydration, or other cause for her symptoms. -Admit to a medical telemetry bed -Neurochecks -Check EEG -Check MRI of the brain -Follow-up telemetry  Prolonged QT interval QT C was prolonged at 507. -Avoid QT prolonging medications -Recheck EKG  Constipation Chronic.  Patient is unsure of her last bowel movement. -Continue senna and Colace  Essential hypertension -Held hydrochlorothiazide in case this is possible factor in her being altered.  Resume when medically appropriate. -Continue losartan  History of CVA Patient does not report any residual deficits from prior stroke back in [id number] where patient suffered a left basal ganglia infarct.  At that time patient presented with slurred speech and right-sided weakness. -Continue aspirin and  statin  Hyperlipidemia -Continue statin  Hypothyroidism TSH- 0.567 and free T4-0.8 -Continue levothyroxine  Depression -Continue venlafaxine and olanzapine  GERD -PPI  Obesity BMI 32.77 kg/m  DVT prophylaxis: Lovenox Advance Care Planning:   Code Status: DNR confirmed with daughter at bedside  Consults: None  Family Communication: Daughter updated at bedside  Severity of Illness: The appropriate patient status for this patient is OBSERVATION. Observation status is judged to be reasonable and necessary in order to provide the required intensity of service to ensure the patient's safety. The patient's presenting symptoms, physical exam findings, and initial radiographic and laboratory data in the context of their medical condition is felt to place them at decreased risk for further clinical deterioration. Furthermore, it is anticipated that the patient will be medically stable for discharge from the hospital within 2 midnights of admission.   Author: Clydie Braun, MD 06/24/2023 3:19 PM  For on call review [email or web address].

## 2023-06-24 NOTE — Progress Notes (Addendum)
EEG called for update on Pt return from MRI, Nurse and Pt are not in room per front desk. Called Nurse number with No answer. Pt appears still not available at this time. EEG will attempt again when schedule permits.

## 2023-06-24 NOTE — ED Notes (Signed)
Handoff report given to [facility] 

## 2023-06-24 NOTE — Progress Notes (Signed)
Patient return after having MRI done.

## 2023-06-24 NOTE — Progress Notes (Signed)
Will attempt EEG once patient returns from MRI

## 2023-06-24 NOTE — Progress Notes (Signed)
Per nurse pt is yet not available for EEG,  tech will check back as time allows.

## 2023-06-24 NOTE — ED Notes (Signed)
Jessica [name] with cl called for transport

## 2023-06-24 NOTE — Progress Notes (Signed)
Patient an admit from [facility]. Patient is alert to self and know her daughter. MP shows nsr. BP stable. Patient denies complaints. MAE's x4. Speech clear. No facial droop noted. Grips equal and strong. Patient walks at facility with walker. No skin issues noted. Patient and daughter denies issues.

## 2023-06-24 NOTE — Progress Notes (Signed)
Patient did not allow lab to draw labs using more than one stick and hot in her hands. MD made aware.

## 2023-06-24 NOTE — Progress Notes (Deleted)
Contacted by [facility], patient moving about, several leads requiring repair.  Tech is aware to repair leads .

## 2023-06-24 NOTE — ED Notes (Signed)
Patient with hx of sleep apnea. Per visitor in room, patient does not utilize CPAP at home. Patient placed on 2LNC while sleeping.

## 2023-06-24 NOTE — Plan of Care (Signed)
Patient arrived today and having multiple tests done.

## 2023-06-25 ENCOUNTER — Observation Stay (HOSPITAL_COMMUNITY): Payer: Medicare Other

## 2023-06-25 DIAGNOSIS — R4182 Altered mental status, unspecified: Secondary | ICD-10-CM | POA: Diagnosis not present

## 2023-06-25 DIAGNOSIS — R569 Unspecified convulsions: Secondary | ICD-10-CM | POA: Diagnosis not present

## 2023-06-25 DIAGNOSIS — G9340 Encephalopathy, unspecified: Secondary | ICD-10-CM | POA: Diagnosis not present

## 2023-06-25 LAB — CBC
HCT: 39 % (ref 36.0–46.0)
Hemoglobin: 12.7 g/dL (ref 12.0–15.0)
MCH: 32.9 pg (ref 26.0–34.0)
MCHC: 32.6 g/dL (ref 30.0–36.0)
MCV: 101 fL — ABNORMAL HIGH (ref 80.0–100.0)
Platelets: 224 10*3/uL (ref 150–400)
RBC: 3.86 MIL/uL — ABNORMAL LOW (ref 3.87–5.11)
RDW: 12.3 % (ref 11.5–15.5)
WBC: 4.9 10*3/uL (ref 4.0–10.5)
nRBC: 0 % (ref 0.0–0.2)

## 2023-06-25 LAB — GLUCOSE, CAPILLARY
Glucose-Capillary: 110 mg/dL — ABNORMAL HIGH (ref 70–99)
Glucose-Capillary: 125 mg/dL — ABNORMAL HIGH (ref 70–99)

## 2023-06-25 NOTE — Evaluation (Signed)
Physical Therapy Evaluation Patient Details Name: Jessica [name] MRN: [id number] DOB: Jan 02, 1941 Today's Date: 06/25/2023  History of Present Illness  Pt is an 82 y/o F admitted on 06/23/23 after presenting with c/o confusion. Pt is being treated for acute encephalopathy with unclear cause at this time. PMH: HTN, dyslipidemia, CVA, hypothyroidism, L breast CA, anxiety, obesity  Clinical Impression  Pt seen for PT evaluation with pt asleep but awakened & agreeable. Pt is not oriented to situation but follows simple commands with extra time throughout session.  Pt is able to complete bed mobility with use of hospital bed features, STS with supervision, & gait in room, bathroom, & hallway with RW & supervision. Pt would benefit from ongoing PT services to address gait with LRAD (pt uses rollator at baseline), cognition, balance, & safety with mobility.      If plan is discharge home, recommend the following: Assistance with cooking/housework;Assist for transportation   Can travel by private vehicle        Equipment Recommendations None recommended by PT  Recommendations for Other Services       Functional Status Assessment Patient has had a recent decline in their functional status and demonstrates the ability to make significant improvements in function in a reasonable and predictable amount of time.     Precautions / Restrictions Precautions Precautions: Fall Restrictions Weight Bearing Restrictions: No      Mobility  Bed Mobility Overal bed mobility: Needs Assistance Bed Mobility: Supine to Sit     Supine to sit: Supervision, HOB elevated, Used rails     General bed mobility comments: to exit R side of bed    Transfers Overall transfer level: Needs assistance Equipment used: Rolling walker (2 wheels) Transfers: Sit to/from Stand Sit to Stand: Supervision           General transfer comment: STS from EOB, toilet with use of grab bar     Ambulation/Gait Ambulation/Gait assistance: Supervision Gait Distance (Feet): 135 Feet Assistive device: Rolling walker (2 wheels) Gait Pattern/deviations: Decreased step length - right, Decreased step length - left, Decreased stride length Gait velocity: slightly decreased     General Gait Details: Pt attempting to hold RW up off the ground 2/2 she "doesn't like the sound it makes" with PT providing ongoing cuing to keep RW on floor.  Stairs            Wheelchair Mobility     Tilt Bed    Modified Rankin (Stroke Patients Only)       Balance Overall balance assessment: Needs assistance Sitting-balance support: Feet supported Sitting balance-Leahy Scale: Good     Standing balance support: During functional activity, No upper extremity supported Standing balance-Leahy Scale: Fair                               Pertinent Vitals/Pain Pain Assessment Pain Assessment: No/denies pain    Home Living Family/patient expects to be discharged to:: Assisted living                 Home Equipment: Rollator (4 wheels) Additional Comments: Pt reports she lives alone in a nursing home but then endorses it's a senior living apartment & they assist with cooking & cleaning. Nurse reports pt is from ALF.    Prior Function               Mobility Comments: ambulatory with rollator for mobility  Extremity/Trunk Assessment   Upper Extremity Assessment Upper Extremity Assessment: Overall WFL for tasks assessed    Lower Extremity Assessment Lower Extremity Assessment: Overall WFL for tasks assessed;Generalized weakness       Communication   Communication Communication: No apparent difficulties  Cognition Arousal: Alert Behavior During Therapy: WFL for tasks assessed/performed Overall Cognitive Status: Impaired/Different from baseline Area of Impairment: Orientation, Safety/judgement, Following commands                 Orientation  Level: Disoriented to, Situation     Following Commands: Follows one step commands consistently, Follows one step commands with increased time                General Comments General comments (skin integrity, edema, etc.): Pt toileted with continent void, performed hand hygiene & brushing teeth standing at sink with supervision for standing balance. BP in L wrist at beginning of session with pt lying in bed: 147/66 mmHg MAP 89    Exercises     Assessment/Plan    PT Assessment Patient needs continued PT services  PT Problem List Decreased strength;Decreased activity tolerance;Decreased balance;Decreased cognition;Decreased mobility;Decreased safety awareness       PT Treatment Interventions DME instruction;Balance training;Gait training;Neuromuscular re-education;Functional mobility training;Stair training;Therapeutic activities;Therapeutic exercise;Manual techniques;Patient/family education;Cognitive remediation    PT Goals (Current goals can be found in the Care Plan section)  Acute Rehab PT Goals Patient Stated Goal: to go home PT Goal Formulation: With patient Time For Goal Achievement: 07/09/23 Potential to Achieve Goals: Good    Frequency Min 1X/week     Co-evaluation               AM-PAC PT "6 Clicks" Mobility  Outcome Measure Help needed turning from your back to your side while in a flat bed without using bedrails?: None Help needed moving from lying on your back to sitting on the side of a flat bed without using bedrails?: A Little Help needed moving to and from a bed to a chair (including a wheelchair)?: A Little Help needed standing up from a chair using your arms (e.g., wheelchair or bedside chair)?: A Little Help needed to walk in hospital room?: A Little Help needed climbing 3-5 steps with a railing? : A Little 6 Click Score: 19    End of Session   Activity Tolerance: Patient tolerated treatment well Patient left: in chair;with chair alarm  set;with call bell/phone within reach Nurse Communication: Mobility status PT Visit Diagnosis: Unsteadiness on feet (R26.81);Muscle weakness (generalized) (M62.81)    Time: 1610-9604 PT Time Calculation (min) (ACUTE ONLY): 24 min   Charges:   PT Evaluation $PT Eval Low Complexity: 1 Low   PT General Charges $$ ACUTE PT VISIT: 1 Visit         Aleda Grana, PT, DPT 06/25/23, 1:10 PM   Sandi Mariscal 06/25/2023, 1:08 PM

## 2023-06-25 NOTE — Discharge Instructions (Signed)
Follow with Primary MD Jessica Brunette, MD in 7 days   Get CBC, CMP,  checked  by Primary MD next visit.    Activity: As tolerated with Full fall precautions use walker/cane & assistance as needed   Disposition ALF   Diet: Regular   On your next visit with your primary care physician please Get Medicines reviewed and adjusted.   Please request your Prim.MD to go over all Hospital Tests and Procedure/Radiological results at the follow up, please get all Hospital records sent to your Prim MD by signing hospital release before you go home.   If you experience worsening of your admission symptoms, develop shortness of breath, life threatening emergency, suicidal or homicidal thoughts you must seek medical attention immediately by calling 911 or calling your MD immediately  if symptoms less severe.  You Must read complete instructions/literature along with all the possible adverse reactions/side effects for all the Medicines you take and that have been prescribed to you. Take any new Medicines after you have completely understood and accpet all the possible adverse reactions/side effects.   Do not drive, operating heavy machinery, perform activities at heights, swimming or participation in water activities or provide baby sitting services if your were admitted for syncope or siezures until you have seen by Primary MD or a Neurologist and advised to do so again.  Do not drive when taking Pain medications.    Do not take more than prescribed Pain, Sleep and Anxiety Medications  Special Instructions: If you have smoked or chewed Tobacco  in the last 2 yrs please stop smoking, stop any regular Alcohol  and or any Recreational drug use.  Wear Seat belts while driving.   Please note  You were cared for by a hospitalist during your hospital stay. If you have any questions about your discharge medications or the care you received while you were in the hospital after you are discharged, you can  call the unit and asked to speak with the hospitalist on call if the hospitalist that took care of you is not available. Once you are discharged, your primary care physician will handle any further medical issues. Please note that NO REFILLS for any discharge medications will be authorized once you are discharged, as it is imperative that you return to your primary care physician (or establish a relationship with a primary care physician if you do not have one) for your aftercare needs so that they can reassess your need for medications and monitor your lab values.

## 2023-06-25 NOTE — Plan of Care (Signed)

## 2023-06-25 NOTE — Progress Notes (Signed)
EEG complete - results pending 

## 2023-06-25 NOTE — NC FL2 (Addendum)
[place] MEDICAID FL2 LEVEL OF CARE FORM     IDENTIFICATION  Patient Name: Jessica [name] Birthdate: Sep 05, 1941 Sex: female Admission Date (Current Location): 06/23/2023  [facility] and [place] Number:  Producer, television/film/video and Address:  [facility], [address] [place], [place] [address]      Provider Number: [id number]  Attending Physician Name and Address:  Elgergawy, Leana Roe, MD  Relative Name and Phone Number:  Lyndel Safe (Daughter)  [phone] (Mobile)    Current Level of Care: Hospital Recommended Level of Care: Assisted Living Facility Prior Approval Number:    Date Approved/Denied:   PASRR Number:    Discharge Plan: Other (Comment) (ALF)    Current Diagnoses: Patient Active Problem List   Diagnosis Date Noted   Prolonged QT interval 06/24/2023   Altered mental status 06/11/2022   Acute encephalopathy 06/10/2022   UTI (urinary tract infection) 06/10/2022   Constipation    History of CVA (cerebrovascular accident) 06/05/2022   Heme positive stool    Iron deficiency anemia due to chronic blood loss    Left thalamic infarction (HCC) 03/23/2022   Acquired hammer toe of right foot 08/19/2021   Adverse reaction to ACE-I (angiotensin-converting enzyme inhibitor) 08/19/2021   Allergic rhinitis 08/19/2021   Anxiety 08/19/2021   Asthma 08/19/2021   Cardiomegaly 08/19/2021   Dependence on other enabling machines and devices 08/19/2021   History of adenomatous polyp of colon 08/19/2021   History of pneumonia 08/19/2021   Internal hemorrhoid 08/19/2021   Iron deficiency anemia secondary to inadequate dietary iron intake 08/19/2021   Osteopenia 08/19/2021   Personal history of malignant neoplasm of breast 08/19/2021   Somnolence 08/19/2021   Vitamin D deficiency 08/19/2021   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   Insomnia 06/25/2019   Obstructive sleep apnea 06/13/2019   Genetic testing 06/29/2017   Family history of melanoma     Family history of prostate cancer    Family history of breast cancer    Angioedema 05/26/2017   Left axillary seroma status post incision and drainage 05/18/2017 05/18/2017   Cellulitis of left axilla 05/17/2017   GERD (gastroesophageal reflux disease) 05/16/2017   Acquired hypothyroidism 05/16/2017   Depression with anxiety 05/16/2017   Breast cancer of upper-inner quadrant of left female breast (HCC) 03/11/2017   Goals of care, counseling/discussion 03/11/2017   Obesity (BMI 30-39.9) 08/31/2013   Essential hypertension - well controlled 08/31/2013   Depression 08/31/2013   Vasovagal near-syncope -rare 08/31/2013   Hyperlipidemia 08/31/2013    Orientation RESPIRATION BLADDER Height & Weight     Self, Time, Place  Normal Incontinent Weight: 187 lb 13.3 oz (85.2 kg) Height:     BEHAVIORAL SYMPTOMS/MOOD NEUROLOGICAL BOWEL NUTRITION STATUS      Continent Diet (Regular)  AMBULATORY STATUS COMMUNICATION OF NEEDS Skin   Extensive Assist Verbally Normal                       Personal Care Assistance Level of Assistance  Bathing, Feeding, Dressing Bathing Assistance: Limited assistance Feeding assistance: Independent Dressing Assistance: Limited assistance     Functional Limitations Info  Sight, Hearing, Speech Sight Info: Adequate Hearing Info: Adequate Speech Info: Adequate    SPECIAL CARE FACTORS FREQUENCY  PT (By licensed PT)     PT Frequency: 2-3x/week              Contractures Contractures Info: Not present    Additional Factors Info  Code Status, Allergies Code Status  Info: DNR Allergies Info: Benazepril, penecillins, advil(ibuprofen), codeine, Crestor (rosuvastatin Calcium), Sulfamethoxazole-trimethoprim, Bacitracin-polymyxin B, bandaid             Discharge Medications:  STOP taking these medications     cephALEXin 250 MG capsule Commonly known as: KEFLEX           TAKE these medications     acetaminophen 500 MG tablet Commonly known  as: TYLENOL Take 500 mg by mouth every 6 (six) hours as needed.    ascorbic acid 500 MG tablet Commonly known as: VITAMIN C Take 1 tablet (500 mg total) by mouth daily.    aspirin 81 MG chewable tablet Chew 1 tablet (81 mg total) by mouth daily.    atorvastatin 80 MG tablet Commonly known as: LIPITOR Take 1 tablet (80 mg total) by mouth at bedtime.    CENTRUM ADULTS MULTIGUMMIES PO Take 2 each by mouth daily.    cholecalciferol 25 MCG (1000 UNIT) tablet Commonly known as: VITAMIN D3 Take 1 tablet (1,000 Units total) by mouth daily.    ciclopirox 8 % solution Commonly known as: PENLAC APPLY TOPICALLY TO AFFECTED NAIL(S) AT BEDTIME. APPLY OVER NAIL & SURROUNDING SKIN. APPLY DAILY OVER PREVIOUS COAT. **AFTER 7 DAYS, MAY REMOVE WITH ALCOHOL AND CONTINUE CYCLE.**    Cranberry 500 MG Chew Chew 1,000 mg by mouth daily.    docusate sodium 100 MG capsule Commonly known as: COLACE Take 100 mg by mouth 2 (two) times daily.    estradiol 0.1 MG/GM vaginal cream Commonly known as: ESTRACE Place vaginally.    ferrous sulfate 325 (65 FE) MG tablet Take 1 tablet (325 mg total) by mouth daily with breakfast.    fexofenadine 180 MG tablet Commonly known as: ALLEGRA Take 180 mg by mouth daily.    hydrochlorothiazide 25 MG tablet Commonly known as: HYDRODIURIL Take 25 mg by mouth daily.    hydrocortisone 2.5 % rectal cream Commonly known as: ANUSOL-HC Apply 1 Application topically 2 (two) times daily.    L-Lysine 1000 MG Tabs Take 1,000 mg by mouth as needed. As needed for fever blister    lactulose 10 GM/15ML solution Commonly known as: CHRONULAC Take 45 mLs (30 g total) by mouth 2 (two) times daily as needed for mild constipation or moderate constipation.    levothyroxine 75 MCG tablet Commonly known as: SYNTHROID Take 1 tablet (75 mcg total) by mouth daily. What changed: when to take this    losartan 50 MG tablet Commonly known as: COZAAR Take 50 mg by mouth daily.     MAGNESIUM GLYCINATE PO Take 1 each by mouth daily. gummy    nystatin powder Apply 1 Application topically 2 (two) times daily. 60 g to groin folds twice daily    OLANZapine 2.5 MG tablet Commonly known as: ZYPREXA Take 2.5 mg by mouth daily.    omeprazole 20 MG capsule Commonly known as: PRILOSEC Take 20 mg by mouth 2 (two) times daily.    pantoprazole 40 MG tablet Commonly known as: PROTONIX Take 1 tablet (40 mg total) by mouth 2 (two) times daily.    potassium chloride 10 MEQ tablet Commonly known as: KLOR-CON M Take 10 mEq by mouth 2 (two) times daily.    venlafaxine XR 75 MG 24 hr capsule Commonly known as: EFFEXOR-XR Take 3 capsules (225 mg total) by mouth daily.    Vitamin D (Ergocalciferol) 1.25 MG (50000 UNIT) Caps capsule Commonly known as: DRISDOL Take 1 capsule (50,000 Units total) by mouth every 7 (seven)  days.    Zinc Oxide 10 % Oint Apply 1 Application topically See admin instructions. Provide and apply to vaginal area 3 times daily as needed for itching,burning,moisture             Relevant Imaging Results:  Relevant Lab Results:   Additional Information SSN [id number] [place], [place]

## 2023-06-25 NOTE — Discharge Summary (Signed)
Physician Discharge Summary  Jessica [name]:[id number] DOB: [birth year] DOA: 06/23/2023  PCP: Merri Brunette, MD  Admit date: 06/23/2023 Discharge date: 06/25/2023  Admitted From: ( ALF) Disposition:  (ALF)  Recommendations for Outpatient Follow-up:  Please obtain BMP/CBC in one week   Diet recommendation:  Regular Brief/Interim Summary:    Jessica [name] is a 82 y.o. female with medical history significant of hypertension, dyslipidemia, CVA without significant reported deficit, hypothyroidism, and history of left breast cancer who presents after being noted to have an episode of confusion, weakness, and poor appetite, out of her baseline, they were concerned about UTI, so they brought her to ED for evaluation, Patient had been seen by neurology for involuntary movements back on 04/09/2023 with concern for possible seizure-like activity.  Workup included MRI of the brain  and EEG.which did not note any acute pathology.  Family makes note patient had similar issues with altered mental status previously when she was diagnosed with a acute stroke back in 03/2022. Labs from 8/21 including CBC, CMP, ammonia, and thyroid studies were within normal limits.  Venous blood gas was within normal limits.  Chest x-ray showed no acute abnormality.  Urinalysis showed no signs of infection.  CT scan of the brain noted no acute intracranial process with mild diffuse atrophy and old lacunar infarcts of the left corona radiata and right basal ganglia.  Influenza, COVID-19, and RSV screening were negative.  CT scan also was done of the abdomen and pelvis that did not note any acute abnormality.  MRI brain with no evidence of acute CVA, EEG with no evidence of seizure activity, mentation back to baseline, will be discharged back to her facility.  Acute encephalopathy Patient presents after being noted to be acutely confused with repetitive questioning and inability to recall her birthdate.  Workup was negativeg  CBC, CMP, ammonia, thyroid studies, urinalysis, UDS, chest x-ray, CT of the head, and CT of the abdomen pelvis.   MRI brain with no evidence of acute CVA, EEG with no evidence of seizure activity, mentation back to baseline, will be discharged back to her facility.  Daughter at bedside, and reports mother is back at baseline.    Constipation Chronic.  Patient is unsure of her last bowel movement. -Continue senna and Colace   Essential hypertension -Held hydrochlorothiazide in case this is possible factor in her being altered.  Resume when medically appropriate. -Continue losartan   History of CVA Patient does not report any residual deficits from prior stroke back in [id number] where patient suffered a left basal ganglia infarct.  At that time patient presented with slurred speech and right-sided weakness. -Continue aspirin and statin   Hyperlipidemia -Continue statin   Hypothyroidism TSH- 0.567 and free T4-0.8 -Continue levothyroxine   Depression -Continue venlafaxine and olanzapine   GERD -PPI   Obesity BMI 32.77 kg/m   Prolonged QT interval QT C was prolonged at 507.    Discharge Diagnoses:  Principal Problem:   Acute encephalopathy Active Problems:   Prolonged QT interval   Constipation   Essential hypertension - well controlled   History of CVA (cerebrovascular accident)   Hyperlipidemia   Acquired hypothyroidism   Depression   Obesity (BMI 30-39.9)    Discharge Instructions  Discharge Instructions     Diet - low sodium heart healthy   Complete by: As directed    Discharge instructions   Complete by: As directed    Follow with Primary MD Merri Brunette, MD in 7 days  Get CBC, CMP,  checked  by Primary MD next visit.    Activity: As tolerated with Full fall precautions use walker/cane & assistance as needed   Disposition ALF   Diet: Regular   On your next visit with your primary care physician please Get Medicines reviewed and  adjusted.   Please request your Prim.MD to go over all Hospital Tests and Procedure/Radiological results at the follow up, please get all Hospital records sent to your Prim MD by signing hospital release before you go home.   If you experience worsening of your admission symptoms, develop shortness of breath, life threatening emergency, suicidal or homicidal thoughts you must seek medical attention immediately by calling 911 or calling your MD immediately  if symptoms less severe.  You Must read complete instructions/literature along with all the possible adverse reactions/side effects for all the Medicines you take and that have been prescribed to you. Take any new Medicines after you have completely understood and accpet all the possible adverse reactions/side effects.   Do not drive, operating heavy machinery, perform activities at heights, swimming or participation in water activities or provide baby sitting services if your were admitted for syncope or siezures until you have seen by Primary MD or a Neurologist and advised to do so again.  Do not drive when taking Pain medications.    Do not take more than prescribed Pain, Sleep and Anxiety Medications  Special Instructions: If you have smoked or chewed Tobacco  in the last 2 yrs please stop smoking, stop any regular Alcohol  and or any Recreational drug use.  Wear Seat belts while driving.   Please note  You were cared for by a hospitalist during your hospital stay. If you have any questions about your discharge medications or the care you received while you were in the hospital after you are discharged, you can call the unit and asked to speak with the hospitalist on call if the hospitalist that took care of you is not available. Once you are discharged, your primary care physician will handle any further medical issues. Please note that NO REFILLS for any discharge medications will be authorized once you are discharged, as it is  imperative that you return to your primary care physician (or establish a relationship with a primary care physician if you do not have one) for your aftercare needs so that they can reassess your need for medications and monitor your lab values.   Increase activity slowly   Complete by: As directed       Allergies as of 06/25/2023       Reactions   Benazepril Anaphylaxis   Possible cause of tongue swelling   Penicillins Anaphylaxis   Has patient had a PCN reaction causing immediate rash, facial/tongue/throat swelling, SOB or lightheadedness with hypotension: yes, Has patient had a PCN reaction causing severe rash involving mucus membranes or skin necrosis:  no, Has patient had a PCN reaction that required hospitalization:  no, Has patient had a PCN reaction occurring within the last 10 years: no, If all of the above answers are "NO", then may proceed with Cephalosporin use.   Advil [ibuprofen] Itching, Rash   Rash that turns to blisters   Codeine Hives   hives   Crestor [rosuvastatin Calcium] Other (See Comments)   Pain in joints   Sulfamethoxazole-trimethoprim Hives   hives   Bacitracin-polymyxin B Dermatitis, Rash   Other Rash   Band-aid        Medication List  STOP taking these medications    cephALEXin 250 MG capsule Commonly known as: KEFLEX       TAKE these medications    acetaminophen 500 MG tablet Commonly known as: TYLENOL Take 500 mg by mouth every 6 (six) hours as needed.   ascorbic acid 500 MG tablet Commonly known as: VITAMIN C Take 1 tablet (500 mg total) by mouth daily.   aspirin 81 MG chewable tablet Chew 1 tablet (81 mg total) by mouth daily.   atorvastatin 80 MG tablet Commonly known as: LIPITOR Take 1 tablet (80 mg total) by mouth at bedtime.   CENTRUM ADULTS MULTIGUMMIES PO Take 2 each by mouth daily.   cholecalciferol 25 MCG (1000 UNIT) tablet Commonly known as: VITAMIN D3 Take 1 tablet (1,000 Units total) by mouth daily.    ciclopirox 8 % solution Commonly known as: PENLAC APPLY TOPICALLY TO AFFECTED NAIL(S) AT BEDTIME. APPLY OVER NAIL & SURROUNDING SKIN. APPLY DAILY OVER PREVIOUS COAT. **AFTER 7 DAYS, MAY REMOVE WITH ALCOHOL AND CONTINUE CYCLE.**   Cranberry 500 MG Chew Chew 1,000 mg by mouth daily.   docusate sodium 100 MG capsule Commonly known as: COLACE Take 100 mg by mouth 2 (two) times daily.   estradiol 0.1 MG/GM vaginal cream Commonly known as: ESTRACE Place vaginally.   ferrous sulfate 325 (65 FE) MG tablet Take 1 tablet (325 mg total) by mouth daily with breakfast.   fexofenadine 180 MG tablet Commonly known as: ALLEGRA Take 180 mg by mouth daily.   hydrochlorothiazide 25 MG tablet Commonly known as: HYDRODIURIL Take 25 mg by mouth daily.   hydrocortisone 2.5 % rectal cream Commonly known as: ANUSOL-HC Apply 1 Application topically 2 (two) times daily.   L-Lysine 1000 MG Tabs Take 1,000 mg by mouth as needed. As needed for fever blister   lactulose 10 GM/15ML solution Commonly known as: CHRONULAC Take 45 mLs (30 g total) by mouth 2 (two) times daily as needed for mild constipation or moderate constipation.   levothyroxine 75 MCG tablet Commonly known as: SYNTHROID Take 1 tablet (75 mcg total) by mouth daily. What changed: when to take this   losartan 50 MG tablet Commonly known as: COZAAR Take 50 mg by mouth daily.   MAGNESIUM GLYCINATE PO Take 1 each by mouth daily. gummy   nystatin powder Apply 1 Application topically 2 (two) times daily. 60 g to groin folds twice daily   OLANZapine 2.5 MG tablet Commonly known as: ZYPREXA Take 2.5 mg by mouth daily.   omeprazole 20 MG capsule Commonly known as: PRILOSEC Take 20 mg by mouth 2 (two) times daily.   pantoprazole 40 MG tablet Commonly known as: PROTONIX Take 1 tablet (40 mg total) by mouth 2 (two) times daily.   potassium chloride 10 MEQ tablet Commonly known as: KLOR-CON M Take 10 mEq by mouth 2 (two) times  daily.   venlafaxine XR 75 MG 24 hr capsule Commonly known as: EFFEXOR-XR Take 3 capsules (225 mg total) by mouth daily.   Vitamin D (Ergocalciferol) 1.25 MG (50000 UNIT) Caps capsule Commonly known as: DRISDOL Take 1 capsule (50,000 Units total) by mouth every 7 (seven) days.   Zinc Oxide 10 % Oint Apply 1 Application topically See admin instructions. Provide and apply to vaginal area 3 times daily as needed for itching,burning,moisture        Allergies  Allergen Reactions   Benazepril Anaphylaxis    Possible cause of tongue swelling   Penicillins Anaphylaxis    Has patient had  a PCN reaction causing immediate rash, facial/tongue/throat swelling, SOB or lightheadedness with hypotension: yes, Has patient had a PCN reaction causing severe rash involving mucus membranes or skin necrosis:  no, Has patient had a PCN reaction that required hospitalization:  no, Has patient had a PCN reaction occurring within the last 10 years: no, If all of the above answers are "NO", then may proceed with Cephalosporin use.   Advil [Ibuprofen] Itching and Rash    Rash that turns to blisters   Codeine Hives    hives   Crestor [Rosuvastatin Calcium] Other (See Comments)    Pain in joints   Sulfamethoxazole-Trimethoprim Hives    hives   Bacitracin-Polymyxin B Dermatitis and Rash   Other Rash    Band-aid    Consultations: none   Procedures/Studies: EEG adult  Result Date: 07/12/2023 Charlsie Quest, MD     12-Jul-2023  8:49 AM Patient Name: Jessica [name] MRN: [id number] Epilepsy Attending: Charlsie Quest Referring Physician/Provider: Clydie Braun, MD Date: 2023-07-12 Duration: 23.46 mins Patient history: 82yo F with ams getting eeg to evaluate for seizure Level of alertness: Awake AEDs during EEG study: None Technical aspects: This EEG study was done with scalp electrodes positioned according to the 10-20 International system of electrode placement. Electrical activity was reviewed with  band pass filter of 1-70Hz , sensitivity of 7 uV/mm, display speed of 69mm/sec with a 60Hz  notched filter applied as appropriate. EEG data were recorded continuously and digitally stored.  Video monitoring was available and reviewed as appropriate. Description: The posterior dominant rhythm consists of 8 Hz activity of moderate voltage (25-35 uV) seen predominantly in posterior head regions, symmetric and reactive to eye opening and eye closing. EEG showed intermittent generalized 3 to 6 Hz theta-delta slowing. Hyperventilation and photic stimulation were not performed.   ABNORMALITY - Intermittent slow, generalized IMPRESSION: This study is suggestive of mild diffuse encephalopathy, nonspecific etiology. No seizures or epileptiform discharges were seen throughout the recording. Charlsie Quest   MR BRAIN WO CONTRAST  Result Date: 06/24/2023 CLINICAL DATA:  Altered mental status EXAM: MRI HEAD WITHOUT CONTRAST TECHNIQUE: Multiplanar, multiecho pulse sequences of the brain and surrounding structures were obtained without intravenous contrast. COMPARISON:  04/07/2023 MRI, correlation is also made with 06/23/2023 CT head FINDINGS: Brain: No restricted diffusion to suggest acute or subacute infarct. No acute hemorrhage, mass, mass effect, or midline shift. No hydrocephalus or extra-axial collection. Normal pituitary and craniocervical junction. Remote infarcts in the bilateral basal ganglia, with associated hemosiderin deposition and ex vacuo dilatation the right greater than left lateral ventricle. T2 hyperintense signal in the periventricular white matter, likely the sequela of chronic small vessel ischemic disease. Vascular: Normal arterial flow voids. Skull and upper cervical spine: Normal marrow signal. Sinuses/Orbits: Clear paranasal sinuses. No acute finding in the orbits. Status post bilateral lens replacements. Other: The mastoid air cells are well aerated. IMPRESSION: No acute intracranial process. No  evidence of acute or subacute infarct. Electronically Signed   By: Wiliam Ke M.D.   On: 06/24/2023 23:52   CT ABDOMEN PELVIS W CONTRAST  Result Date: 06/23/2023 CLINICAL DATA:  Abdominal pain, acute, nonlocalized. EXAM: CT ABDOMEN AND PELVIS WITH CONTRAST TECHNIQUE: Multidetector CT imaging of the abdomen and pelvis was performed using the standard protocol following bolus administration of intravenous contrast. RADIATION DOSE REDUCTION: This exam was performed according to the departmental dose-optimization program which includes automated exposure control, adjustment of the mA and/or kV according to patient size and/or use of iterative  reconstruction technique. CONTRAST:  85mL OMNIPAQUE IOHEXOL 300 MG/ML  SOLN COMPARISON:  06/10/2022 FINDINGS: Lower chest: No acute abnormality Hepatobiliary: No focal hepatic abnormality. Gallbladder unremarkable. Pancreas: No focal abnormality or ductal dilatation. Spleen: No focal abnormality.  Normal size. Adrenals/Urinary Tract: No adrenal abnormality. No focal renal abnormality. No stones or hydronephrosis. Urinary bladder is unremarkable. Stomach/Bowel: Stomach, large and small bowel grossly unremarkable. Vascular/Lymphatic: No evidence of aneurysm or adenopathy. Aortic atherosclerosis. Reproductive: Prior hysterectomy.  No adnexal masses. Other: No free fluid or free air. Musculoskeletal: No acute bony abnormality. IMPRESSION: No acute findings in the abdomen or pelvis. Aortic atherosclerosis. Electronically Signed   By: Charlett Nose M.D.   On: 06/23/2023 22:22   CT Head Wo Contrast  Result Date: 06/23/2023 CLINICAL DATA:  Mental status change EXAM: CT HEAD WITHOUT CONTRAST TECHNIQUE: Contiguous axial images were obtained from the base of the skull through the vertex without intravenous contrast. RADIATION DOSE REDUCTION: This exam was performed according to the departmental dose-optimization program which includes automated exposure control, adjustment of the mA  and/or kV according to patient size and/or use of iterative reconstruction technique. COMPARISON:  Head CT 04/07/2023 FINDINGS: Brain: No evidence of acute infarction, hemorrhage, hydrocephalus, extra-axial collection or mass lesion/mass effect. Again seen is mild diffuse atrophy. There is an old lacunar infarct in the left corona radiata and right basal ganglia. Vascular: Atherosclerotic calcifications are present within the cavernous internal carotid arteries. Skull: Normal. Negative for fracture or focal lesion. Sinuses/Orbits: No acute finding. Other: None. IMPRESSION: 1. No acute intracranial process. 2. Mild diffuse atrophy. 3. Old lacunar infarcts in the left corona radiata and right basal ganglia. Electronically Signed   By: Darliss Cheney M.D.   On: 06/23/2023 20:42   DG Chest Portable 1 View  Result Date: 06/23/2023 CLINICAL DATA:  Altered mental status. EXAM: PORTABLE CHEST 1 VIEW COMPARISON:  Mar 07, 2018 FINDINGS: There is stable right-sided venous Port-A-Cath positioning. The heart size and mediastinal contours are within normal limits. There is no evidence of an acute infiltrate, pleural effusion or pneumothorax. Radiopaque surgical clips are seen within the soft tissues of the left breast. Degenerative changes seen throughout the thoracic spine. IMPRESSION: No active cardiopulmonary disease. Electronically Signed   By: Aram Candela M.D.   On: 06/23/2023 20:17   (Echo, Carotid, EGD, Colonoscopy, ERCP)    Subjective: No significant events overnight as discussed with patient and daughter at bedside, she denies any complaints today  Discharge Exam: Vitals:   06/25/23 0741 06/25/23 0800  BP:  (!) 141/123  Pulse:  65  Resp:  13  Temp: 98.7 F (37.1 C)   SpO2:  92%   Vitals:   06/25/23 0320 06/25/23 0556 06/25/23 0741 06/25/23 0800  BP: (!) 127/54   (!) 141/123  Pulse: 80 72  65  Resp:    13  Temp: (!) 97.4 F (36.3 C)  98.7 F (37.1 C)   TempSrc: Oral  Axillary   SpO2: (!)  89% 95%  92%  Weight:  85.2 kg      General: Pt is alert, awake, not in acute distress, frail, deconditioned,  Cardiovascular: RRR, S1/S2 +, no rubs, no gallops Respiratory: CTA bilaterally, no wheezing, no rhonchi Abdominal: Soft, NT, ND, bowel sounds + Extremities: no edema, no cyanosis    The results of significant diagnostics from this hospitalization (including imaging, microbiology, ancillary and laboratory) are listed below for reference.     Microbiology: Recent Results (from the past 240 hour(s))  Resp panel  by RT-PCR (RSV, Flu A&B, Covid) Anterior Nasal Swab     Status: None   Collection Time: 06/23/23  8:12 PM   Specimen: Anterior Nasal Swab  Result Value Ref Range Status   SARS Coronavirus 2 by RT PCR NEGATIVE NEGATIVE Final    Comment: (NOTE) SARS-CoV-2 target nucleic acids are NOT DETECTED.  The SARS-CoV-2 RNA is generally detectable in upper respiratory specimens during the acute phase of infection. The lowest concentration of SARS-CoV-2 viral copies this assay can detect is 138 copies/mL. A negative result does not preclude SARS-Cov-2 infection and should not be used as the sole basis for treatment or other patient management decisions. A negative result may occur with  improper specimen collection/handling, submission of specimen other than nasopharyngeal swab, presence of viral mutation(s) within the areas targeted by this assay, and inadequate number of viral copies(<138 copies/mL). A negative result must be combined with clinical observations, patient history, and epidemiological information. The expected result is Negative.  Fact Sheet for Patients:  BloggerCourse.com  Fact Sheet for Healthcare Providers:  SeriousBroker.it  This test is no t yet approved or cleared by the [place] FDA and  has been authorized for detection and/or diagnosis of SARS-CoV-2 by FDA under an Emergency Use Authorization  (EUA). This EUA will remain  in effect (meaning this test can be used) for the duration of the COVID-19 declaration under Section 564(b)(1) of the Act, 21 U.S.C.section 360bbb-3(b)(1), unless the authorization is terminated  or revoked sooner.       Influenza A by PCR NEGATIVE NEGATIVE Final   Influenza B by PCR NEGATIVE NEGATIVE Final    Comment: (NOTE) The Xpert Xpress SARS-CoV-2/FLU/RSV plus assay is intended as an aid in the diagnosis of influenza from Nasopharyngeal swab specimens and should not be used as a sole basis for treatment. Nasal washings and aspirates are unacceptable for Xpert Xpress SARS-CoV-2/FLU/RSV testing.  Fact Sheet for Patients: BloggerCourse.com  Fact Sheet for Healthcare Providers: SeriousBroker.it  This test is not yet approved or cleared by the [place] FDA and has been authorized for detection and/or diagnosis of SARS-CoV-2 by FDA under an Emergency Use Authorization (EUA). This EUA will remain in effect (meaning this test can be used) for the duration of the COVID-19 declaration under Section 564(b)(1) of the Act, 21 U.S.C. section 360bbb-3(b)(1), unless the authorization is terminated or revoked.     Resp Syncytial Virus by PCR NEGATIVE NEGATIVE Final    Comment: (NOTE) Fact Sheet for Patients: BloggerCourse.com  Fact Sheet for Healthcare Providers: SeriousBroker.it  This test is not yet approved or cleared by the [place] FDA and has been authorized for detection and/or diagnosis of SARS-CoV-2 by FDA under an Emergency Use Authorization (EUA). This EUA will remain in effect (meaning this test can be used) for the duration of the COVID-19 declaration under Section 564(b)(1) of the Act, 21 U.S.C. section 360bbb-3(b)(1), unless the authorization is terminated or revoked.  Performed at [facility], [address] [place], [place] [address]      Labs: BNP (last 3 results) No results for input(s): "BNP" in the last 8760 hours. Basic Metabolic Panel: Recent Labs  Lab 06/23/23 1823 06/23/23 2023 06/24/23 1640  NA 140 138 139  K 3.7 3.6 4.0  CL 99  --  97*  CO2 32  --  28  GLUCOSE 91  --  149*  BUN 9  --  8  CREATININE 0.63  --  0.75  CALCIUM 9.7  --  9.5   Liver Function Tests: Recent Labs  Lab 06/23/23 1823  AST 23  ALT 27  ALKPHOS 68  BILITOT 0.6  PROT 7.0  ALBUMIN 4.2   No results for input(s): "LIPASE", "AMYLASE" in the last 168 hours. Recent Labs  Lab 06/23/23 2012  AMMONIA 21   CBC: Recent Labs  Lab 06/23/23 1823 06/23/23 2023 06/24/23 1640 06/25/23 0553  WBC 5.4  --  5.0 4.9  HGB 13.6 12.6 13.1 12.7  HCT 40.6 37.0 38.8 39.0  MCV 101.2*  --  99.0 101.0*  PLT 234  --  229 224   Cardiac Enzymes: No results for input(s): "CKTOTAL", "CKMB", "CKMBINDEX", "TROPONINI" in the last 168 hours. BNP: Invalid input(s): "POCBNP" CBG: Recent Labs  Lab 06/23/23 1821 06/24/23 1741 06/25/23 0739  GLUCAP 89 139* 110*   D-Dimer No results for input(s): "DDIMER" in the last 72 hours. Hgb A1c No results for input(s): "HGBA1C" in the last 72 hours. Lipid Profile No results for input(s): "CHOL", "HDL", "LDLCALC", "TRIG", "CHOLHDL", "LDLDIRECT" in the last 72 hours. Thyroid function studies Recent Labs    06/23/23 2012  TSH 0.567   Anemia work up No results for input(s): "VITAMINB12", "FOLATE", "FERRITIN", "TIBC", "IRON", "RETICCTPCT" in the last 72 hours. Urinalysis    Component Value Date/Time   COLORURINE YELLOW 06/23/2023 1840   APPEARANCEUR CLEAR 06/23/2023 1840   LABSPEC 1.014 06/23/2023 1840   PHURINE 7.5 06/23/2023 1840   GLUCOSEU NEGATIVE 06/23/2023 1840   HGBUR NEGATIVE 06/23/2023 1840   BILIRUBINUR NEGATIVE 06/23/2023 1840   KETONESUR NEGATIVE 06/23/2023 1840   PROTEINUR NEGATIVE 06/23/2023 1840   NITRITE NEGATIVE 06/23/2023 1840    LEUKOCYTESUR NEGATIVE 06/23/2023 1840   Sepsis Labs Recent Labs  Lab 06/23/23 1823 06/24/23 1640 06/25/23 0553  WBC 5.4 5.0 4.9   Microbiology Recent Results (from the past 240 hour(s))  Resp panel by RT-PCR (RSV, Flu A&B, Covid) Anterior Nasal Swab     Status: None   Collection Time: 06/23/23  8:12 PM   Specimen: Anterior Nasal Swab  Result Value Ref Range Status   SARS Coronavirus 2 by RT PCR NEGATIVE NEGATIVE Final    Comment: (NOTE) SARS-CoV-2 target nucleic acids are NOT DETECTED.  The SARS-CoV-2 RNA is generally detectable in upper respiratory specimens during the acute phase of infection. The lowest concentration of SARS-CoV-2 viral copies this assay can detect is 138 copies/mL. A negative result does not preclude SARS-Cov-2 infection and should not be used as the sole basis for treatment or other patient management decisions. A negative result may occur with  improper specimen collection/handling, submission of specimen other than nasopharyngeal swab, presence of viral mutation(s) within the areas targeted by this assay, and inadequate number of viral copies(<138 copies/mL). A negative result must be combined with clinical observations, patient history, and epidemiological information. The expected result is Negative.  Fact Sheet for Patients:  BloggerCourse.com  Fact Sheet for Healthcare Providers:  SeriousBroker[employer]it  This test is no t yet approved or cleared by the [place] FDA and  has been authorized for detection and/or diagnosis of SARS-CoV-2 by FDA under an Emergency Use Authorization (EUA). This EUA will remain  in effect (meaning this test can be used) for the duration of the COVID-19 declaration under Section 564(b)(1) of the Act, 21 U.S.C.section 360bbb-3(b)(1), unless the authorization is terminated  or revoked sooner.       Influenza A by PCR NEGATIVE NEGATIVE Final   Influenza B by PCR  NEGATIVE NEGATIVE Final  Comment: (NOTE) The Xpert Xpress SARS-CoV-2/FLU/RSV plus assay is intended as an aid in the diagnosis of influenza from Nasopharyngeal swab specimens and should not be used as a sole basis for treatment. Nasal washings and aspirates are unacceptable for Xpert Xpress SARS-CoV-2/FLU/RSV testing.  Fact Sheet for Patients: BloggerCourse.com  Fact Sheet for Healthcare Providers: SeriousBroker.it  This test is not yet approved or cleared by the [place] FDA and has been authorized for detection and/or diagnosis of SARS-CoV-2 by FDA under an Emergency Use Authorization (EUA). This EUA will remain in effect (meaning this test can be used) for the duration of the COVID-19 declaration under Section 564(b)(1) of the Act, 21 U.S.C. section 360bbb-3(b)(1), unless the authorization is terminated or revoked.     Resp Syncytial Virus by PCR NEGATIVE NEGATIVE Final    Comment: (NOTE) Fact Sheet for Patients: BloggerCourse.com  Fact Sheet for Healthcare Providers: SeriousBroker.it  This test is not yet approved or cleared by the [place] FDA and has been authorized for detection and/or diagnosis of SARS-CoV-2 by FDA under an Emergency Use Authorization (EUA). This EUA will remain in effect (meaning this test can be used) for the duration of the COVID-19 declaration under Section 564(b)(1) of the Act, 21 U.S.C. section 360bbb-3(b)(1), unless the authorization is terminated or revoked.  Performed at [facility], [address] [place], [place] [address]      Time coordinating discharge: Over 30 minutes  SIGNED:   Huey Bienenstock, MD  [facility] 06/25/2023, 11:42 AM Pager   If 7PM-7AM, please contact night-coverage [email or web address]

## 2023-06-25 NOTE — Procedures (Signed)
Patient Name: Jessica [name]  MRN: [id number]  Epilepsy Attending: Charlsie Quest  Referring Physician/Provider: Clydie Braun, MD  Date: 06/25/2023  Duration: 23.46 mins  Patient history: 82yo F with ams getting eeg to evaluate for seizure  Level of alertness: Awake  AEDs during EEG study: None  Technical aspects: This EEG study was done with scalp electrodes positioned according to the 10-20 International system of electrode placement. Electrical activity was reviewed with band pass filter of 1-70Hz , sensitivity of 7 uV/mm, display speed of 14mm/sec with a 60Hz  notched filter applied as appropriate. EEG data were recorded continuously and digitally stored.  Video monitoring was available and reviewed as appropriate.  Description: The posterior dominant rhythm consists of 8 Hz activity of moderate voltage (25-35 uV) seen predominantly in posterior head regions, symmetric and reactive to eye opening and eye closing. EEG showed intermittent generalized 3 to 6 Hz theta-delta slowing. Hyperventilation and photic stimulation were not performed.     ABNORMALITY - Intermittent slow, generalized  IMPRESSION: This study is suggestive of mild diffuse encephalopathy, nonspecific etiology. No seizures or epileptiform discharges were seen throughout the recording.  [name]

## 2023-06-25 NOTE — TOC Transition Note (Signed)
Transition of Care [facility]) - CM/SW Discharge Note   Patient Details  Name: Jessica [name] MRN: [id number] Date of Birth: 11-21-1940  Transition of Care [facility]) CM/SW Contact:  Erin Sons, LCSW Phone Number: 06/25/2023, 2:29 PM   Clinical Narrative:     Pt is from [facility]. CSW called facility and notified them of DC. They have therapy services in house with [facility]. Fl2, DC summary, HH order, and Face to face faxed to [phone]. [name] daughter will provide transport at DC.    Final next level of care: Assisted Living Barriers to Discharge: No Barriers Identified   Patient Goals and CMS Choice      Discharge Placement                         Discharge Plan and Services Additional resources added to the After Visit Summary for                                       Social Determinants of Health (SDOH) Interventions SDOH Screenings   Depression (PHQ2-9): Low Risk  (01/12/2023)  Tobacco Use: Low Risk  (06/23/2023)     Readmission Risk Interventions    06/05/2022   12:47 PM  Readmission Risk Prevention Plan  Transportation Screening Complete  PCP or Specialist Appt within 3-5 Days Complete  HRI or Home Care Consult Complete  Social Work Consult for Recovery Care Planning/Counseling Complete  Palliative Care Screening Not Applicable  Medication Review Oceanographer) Complete

## 2023-06-29 DIAGNOSIS — R059 Cough, unspecified: Secondary | ICD-10-CM | POA: Diagnosis not present

## 2023-06-29 DIAGNOSIS — N3020 Other chronic cystitis without hematuria: Secondary | ICD-10-CM | POA: Diagnosis not present

## 2023-06-29 DIAGNOSIS — Z09 Encounter for follow-up examination after completed treatment for conditions other than malignant neoplasm: Secondary | ICD-10-CM | POA: Diagnosis not present

## 2023-06-29 DIAGNOSIS — Z8673 Personal history of transient ischemic attack (TIA), and cerebral infarction without residual deficits: Secondary | ICD-10-CM | POA: Diagnosis not present

## 2023-06-29 DIAGNOSIS — R410 Disorientation, unspecified: Secondary | ICD-10-CM | POA: Diagnosis not present

## 2023-06-29 DIAGNOSIS — Z7189 Other specified counseling: Secondary | ICD-10-CM | POA: Diagnosis not present

## 2023-07-23 ENCOUNTER — Encounter: Payer: Self-pay | Admitting: Orthopedic Surgery

## 2023-07-23 ENCOUNTER — Non-Acute Institutional Stay (INDEPENDENT_AMBULATORY_CARE_PROVIDER_SITE_OTHER): Payer: Medicare Other | Admitting: Orthopedic Surgery

## 2023-07-23 DIAGNOSIS — Z0000 Encounter for general adult medical examination without abnormal findings: Secondary | ICD-10-CM | POA: Diagnosis not present

## 2023-07-23 NOTE — Patient Instructions (Signed)
  Jessica [name] , Thank you for taking time to come for your Medicare Wellness Visit. I appreciate your ongoing commitment to your health goals. Please review the following plan we discussed and let me know if I can assist you in the future.   These are the goals we discussed:  Goals      Maintain Mobility and Function     Evidence-based guidance:  Acknowledge and validate impact of pain, loss of strength and potential disfigurement (hand osteoarthritis) on mental health and daily life, such as social isolation, anxiety, depression, impaired sexual relationship and   injury from falls.  Anticipate referral to physical or occupational therapy for assessment, therapeutic exercise and recommendation for adaptive equipment or assistive devices; encourage participation.  Assess impact on ability to perform activities of daily living, as well as engage in sports and leisure events or requirements of work or school.  Provide anticipatory guidance and reassurance about the benefit of exercise to maintain function; acknowledge and normalize fear that exercise may worsen symptoms.  Encourage regular exercise, at least 10 minutes at a time for 45 minutes per week; consider yoga, water exercise and proprioceptive exercises; encourage use of wearable activity tracker to increase motivation and adherence.  Encourage maintenance or resumption of daily activities, including employment, as pain allows and with minimal exposure to trauma.  Assist patient to advocate for adaptations to the work environment.  Consider level of pain and function, gender, age, lifestyle, patient preference, quality of life, readiness and ?ocapacity to benefit? when recommending patients for orthopaedic surgery consultation.  Explore strategies, such as changes to medication regimen or activity that enables patient to anticipate and manage flare-ups that increase deconditioning and disability.  Explore patient preferences; encourage  exposure to a broader range of activities that have been avoided for fear of experiencing pain.  Identify barriers to participation in therapy or exercise, such as pain with activity, anticipated or imagined pain.  Monitor postoperative joint replacement or any preexisting joint replacement for ongoing pain and loss of function; provide social support and encouragement throughout recovery.   Notes:         This is a list of the screening recommended for you and due dates:  Health Maintenance  Topic Date Due   DEXA scan (bone density measurement)  Never done   DTaP/Tdap/Td vaccine (3 - Td or Tdap) 03/24/2013   COVID-19 Vaccine (3 - Pfizer risk series) 01/15/2020   Flu Shot  08/26/2024*   Medicare Annual Wellness Visit  07/22/2024   Colon Cancer Screening  06/03/2025   Pneumonia Vaccine  Completed   Zoster (Shingles) Vaccine  Completed   HPV Vaccine  Aged Out  *Topic was postponed. The date shown is not the original due date.

## 2023-07-23 NOTE — Progress Notes (Signed)
Subjective:   Jessica [name] is a 82 y.o. female who presents for Medicare Annual (Subsequent) preventive examination.  Visit Complete: In person  Patient Medicare AWV questionnaire was completed by the patient on 07/23/2023; I have confirmed that all information answered by patient is correct and no changes since this date.        Objective:    Today's Vitals   07/23/23 1417  BP: 125/80  Pulse: 88  Resp: 18  Temp: 98.1 F (36.7 C)  SpO2: 92%  Weight: 190 lb 9.6 oz (86.5 kg)  Height: 5\' 3"  (1.6 m)  PainSc: 3    Body mass index is 33.76 kg/m.     06/23/2023    6:06 PM 05/31/2023    2:56 PM 11/27/2022    1:35 PM 10/13/2022    9:37 AM 10/13/2022    9:16 AM 10/13/2022    8:19 AM 08/18/2022    1:02 PM  Advanced Directives  Does Patient Have a Medical Advance Directive? Yes Yes Yes Yes Yes Yes Yes  Type of Special educational needs teacher of [place];Living will Healthcare Power of [place];Living will Healthcare Power of [place];Living will Healthcare Power of [place];Living will Healthcare Power of [place];Living will Healthcare Power of [place];Living will  Does patient want to make changes to medical advance directive?   No - Patient declined    No - Patient declined  Copy of Healthcare Power of Attorney in Chart?  Yes - validated most recent copy scanned in chart (See row information) Yes - validated most recent copy scanned in chart (See row information)    No - copy requested  Would patient like information on creating a medical advance directive?   No - Patient declined    No - Patient declined    Current Medications (verified) Outpatient Encounter Medications as of 07/23/2023  Medication Sig   acetaminophen (TYLENOL) 500 MG tablet Take 500 mg by mouth every 6 (six) hours as needed.   ascorbic acid (VITAMIN C) 500 MG tablet Take 1 tablet (500 mg total) by mouth daily.   aspirin 81 MG chewable tablet Chew 1 tablet (81 mg total) by mouth daily.    atorvastatin (LIPITOR) 80 MG tablet Take 1 tablet (80 mg total) by mouth at bedtime.   cholecalciferol (VITAMIN D3) 25 MCG (1000 UNIT) tablet Take 1 tablet (1,000 Units total) by mouth daily.   ciclopirox (PENLAC) 8 % solution APPLY TOPICALLY TO AFFECTED NAIL(S) AT BEDTIME. APPLY OVER NAIL & SURROUNDING SKIN. APPLY DAILY OVER PREVIOUS COAT. **AFTER 7 DAYS, MAY REMOVE WITH ALCOHOL AND CONTINUE CYCLE.**   Cranberry 500 MG CHEW Chew 1,000 mg by mouth daily.   docusate sodium (COLACE) 100 MG capsule Take 100 mg by mouth 2 (two) times daily.   estradiol (ESTRACE) 0.1 MG/GM vaginal cream Place vaginally.   ferrous sulfate 325 (65 FE) MG tablet Take 1 tablet (325 mg total) by mouth daily with breakfast.   fexofenadine (ALLEGRA) 180 MG tablet Take 180 mg by mouth daily.   hydrochlorothiazide (HYDRODIURIL) 25 MG tablet Take 25 mg by mouth daily.   hydrocortisone (ANUSOL-HC) 2.5 % rectal cream Apply 1 Application topically 2 (two) times daily.   L-Lysine 1000 MG TABS Take 1,000 mg by mouth as needed. As needed for fever blister   lactulose (CHRONULAC) 10 GM/15ML solution Take 45 mLs (30 g total) by mouth 2 (two) times daily as needed for mild constipation or moderate constipation.   levothyroxine (SYNTHROID) 75 MCG tablet Take 1 tablet (75  mcg total) by mouth daily. (Patient taking differently: Take 75 mcg by mouth every morning.)   losartan (COZAAR) 50 MG tablet Take 50 mg by mouth daily.   MAGNESIUM GLYCINATE PO Take 1 each by mouth daily. gummy   Multiple Vitamins-Minerals (CENTRUM ADULTS MULTIGUMMIES PO) Take 2 each by mouth daily.   nystatin powder Apply 1 Application topically 2 (two) times daily. 60 g to groin folds twice daily   OLANZapine (ZYPREXA) 2.5 MG tablet Take 2.5 mg by mouth daily.   omeprazole (PRILOSEC) 20 MG capsule Take 20 mg by mouth 2 (two) times daily.   pantoprazole (PROTONIX) 40 MG tablet Take 1 tablet (40 mg total) by mouth 2 (two) times daily. (Patient not taking: Reported on  06/24/2023)   potassium chloride (KLOR-CON M) 10 MEQ tablet Take 10 mEq by mouth 2 (two) times daily.   venlafaxine XR (EFFEXOR-XR) 75 MG 24 hr capsule Take 3 capsules (225 mg total) by mouth daily.   Vitamin D, Ergocalciferol, (DRISDOL) 1.25 MG (50000 UNIT) CAPS capsule Take 1 capsule (50,000 Units total) by mouth every 7 (seven) days. (Patient not taking: Reported on 06/24/2023)   Zinc Oxide 10 % OINT Apply 1 Application topically See admin instructions. Provide and apply to vaginal area 3 times daily as needed for itching,burning,moisture   No facility-administered encounter medications on file as of 07/23/2023.    Allergies (verified) Benazepril, Penicillins, Advil [ibuprofen], Codeine, Crestor [rosuvastatin calcium], Sulfamethoxazole-trimethoprim, Bacitracin-polymyxin b, and Other   History: Past Medical History:  Diagnosis Date   Allergy    Anemia    Anxiety    Asthma    in past, no inhalers now   Blood transfusion without reported diagnosis    Breast cancer (HCC) 01/2017   left breast    Breast cancer of upper-inner quadrant of left female breast (HCC) 03/11/2017   Cancer (HCC) 01/2017   left breast   Cataract    bilateral removed   Chronic kidney disease    kidney stones   Depression 08/31/2013   Dyslipidemia, goal LDL below 160 08/31/2013   Essential hypertension - well controlled 08/31/2013   Family history of breast cancer    Family history of melanoma    Family history of prostate cancer    GERD (gastroesophageal reflux disease)    Goals of care, counseling/discussion 03/11/2017   History of radiation therapy 10/13/17-11/11/17   left breast 40.05 Gy in 15 fractions, left breast boost 10 Gy in 5 fractions   Hypothyroidism    Ischemic stroke [facility])    May 2023;   Obesity (BMI 30-39.9) 08/31/2013   Personal history of chemotherapy 2018   Personal history of radiation therapy 2019   Thyroid disease    Vasovagal near-syncope -rare 08/31/2013   Past Surgical History:   Procedure Laterality Date   ABDOMINAL HYSTERECTOMY     total   BREAST BIOPSY Left 02/24/2017    malignant   BREAST LUMPECTOMY Left 03/23/2017   broken fingers     CATARACT EXTRACTION Bilateral    COLONOSCOPY     COLONOSCOPY WITH PROPOFOL N/A 06/03/2022   Procedure: COLONOSCOPY WITH PROPOFOL;  Surgeon: Jenel Lucks, MD;  Location: [facility];  Service: Gastroenterology;  Laterality: N/A;   ESOPHAGOGASTRODUODENOSCOPY (EGD) WITH PROPOFOL N/A 05/29/2022   Procedure: ESOPHAGOGASTRODUODENOSCOPY (EGD) WITH PROPOFOL;  Surgeon: Beverley Fiedler, MD;  Location: [facility];  Service: Gastroenterology;  Laterality: N/A;   EXCISIONAL HEMORRHOIDECTOMY     IR FLUORO GUIDE PORT INSERTION RIGHT  08/04/2017   IR  REMOVAL TUN ACCESS W/ PORT W/O FL MOD SED  08/04/2017   IR US GUIDE VASC ACCESS RIGHT  08/04/2017   IRRIGATION AND DEBRIDEMENT ABSCESS Left 05/18/2017   Procedure: IRRIGATION AND DEBRIDEMENT LEFT AXILLARY ABSCESS;  Surgeon: Karie Soda, MD;  Location: [facility];  Service: General;  Laterality: Left;   kidney stones lithotripsy     POLYPECTOMY  06/03/2022   Procedure: POLYPECTOMY;  Surgeon: Jenel Lucks, MD;  Location: [facility];  Service: Gastroenterology;;   PORTACATH PLACEMENT Right 03/23/2017   Procedure: INSERTION PORT-A-CATH;  Surgeon: Harriette Bouillon, MD;  Location: [facility];  Service: General;  Laterality: Right;   RADIOACTIVE SEED GUIDED PARTIAL MASTECTOMY WITH AXILLARY SENTINEL LYMPH NODE BIOPSY Left 03/23/2017   Procedure: LEFT BREAST RADIOACTIVE SEED GUIDED PARTIAL MASTECTOMY AND  SENTINEL LYMPH NODE MAPPING;  Surgeon: Harriette Bouillon, MD;  Location: [facility];  Service: General;  Laterality: Left;   ROTATOR CUFF REPAIR     left    TONSILLECTOMY     WISDOM TOOTH EXTRACTION     Family History  Problem Relation Age of Onset   Heart disease Mother        Angina   Heart disease Father    Heart attack Brother        Died of MI in his mid 71s    Prostate cancer Brother        dx in his early 74s   Cervical cancer Maternal Aunt    Breast cancer Paternal Aunt        dx over 74   Bone cancer Cousin        maternal first cousin dx in her 86s-60s   Lung cancer Cousin        paternal first cousin   Colon cancer Neg Hx    Pancreatic cancer Neg Hx    Stomach cancer Neg Hx    Esophageal cancer Neg Hx    Rectal cancer Neg Hx    Seizures Neg Hx    Social History   Socioeconomic History   Marital status: Widowed    Spouse name: Not on file   Number of children: 3   Years of education: Not on file   Highest education level: Not on file  Occupational History   Occupation: retired   Tobacco Use   Smoking status: Never    Passive exposure: Past   Smokeless tobacco: Never   Tobacco comments:    Secondhand exposure, husband was heavy smoker  Vaping Use   Vaping status: Never Used  Substance and Sexual Activity   Alcohol use: No   Drug use: No   Sexual activity: Not on file  Other Topics Concern   Not on file  Social History Narrative   Married mother of 3 with one grandchild. Never smoked. Does not drink alcohol.Walks 3-4 days a week for roughly 20-30 minutes.      Update 04/09/2023   Lives at [facility]   Right handed   Social Determinants of Health   Financial Resource Strain: Low Risk  (07/23/2023)   Overall Financial Resource Strain (CARDIA)    Difficulty of Paying Living Expenses: Not hard at all  Food Insecurity: No Food Insecurity (07/23/2023)   Hunger Vital Sign    Worried About Running Out of Food in the Last Year: Never true    Ran Out of Food in the Last Year: Never true  Transportation Needs: No Transportation Needs (07/23/2023)   PRAPARE - Transportation  Lack of Transportation (Medical): No    Lack of Transportation (Non-Medical): No  Physical Activity: Insufficiently Active (07/23/2023)   Exercise Vital Sign    Days of Exercise per Week: 7 days    Minutes of Exercise per Session:  10 min  Stress: No Stress Concern Present (07/23/2023)   [employer] of Occupational Health - Occupational Stress Questionnaire    Feeling of Stress : Only a little  Social Connections: Socially Isolated (07/23/2023)   Social Connection and Isolation Panel [NHANES]    Frequency of Communication with Friends and Family: More than three times a week    Frequency of Social Gatherings with Friends and Family: More than three times a week    Attends Religious Services: Never    Database administrator or Organizations: No    Attends Banker Meetings: Never    Marital Status: Widowed    Tobacco Counseling Counseling given: Not Answered Tobacco comments: Secondhand exposure, husband was heavy smoker   Clinical Intake:  Pre-visit preparation completed: Yes  Pain : 0-10 Pain Score: 3  Pain Type: Chronic pain Pain Location: Shoulder Pain Orientation: Left Pain Descriptors / Indicators: Sharp Pain Onset: More than a month ago Pain Frequency: Intermittent Pain Relieving Factors: medication  Pain Relieving Factors: medication  Nutritional Risks: Unintentional weight gain Diabetes: No  What is the last grade level you completed in school?: 1 [employer]  Interpreter Needed?: No      Activities of Daily Living    07/23/2023    2:20 PM  In your present state of health, do you have any difficulty performing the following activities:  Hearing? 0  Vision? 0  Difficulty concentrating or making decisions? 0  Walking or climbing stairs? 0  Dressing or bathing? 0  Doing errands, shopping? 1    Patient Care Team: Mahlon Gammon, MD as PCP - General (Internal Medicine)  Indicate any recent Medical Services you may have received from other than [facility] providers in the past year (date may be approximate).     Assessment:   This is a routine wellness examination for [place].  Hearing/Vision screen No results found.   Goals Addressed             This Visit's  Progress    Maintain Mobility and Function   On track    Evidence-based guidance:  Acknowledge and validate impact of pain, loss of strength and potential disfigurement (hand osteoarthritis) on mental health and daily life, such as social isolation, anxiety, depression, impaired sexual relationship and   injury from falls.  Anticipate referral to physical or occupational therapy for assessment, therapeutic exercise and recommendation for adaptive equipment or assistive devices; encourage participation.  Assess impact on ability to perform activities of daily living, as well as engage in sports and leisure events or requirements of work or school.  Provide anticipatory guidance and reassurance about the benefit of exercise to maintain function; acknowledge and normalize fear that exercise may worsen symptoms.  Encourage regular exercise, at least 10 minutes at a time for 45 minutes per week; consider yoga, water exercise and proprioceptive exercises; encourage use of wearable activity tracker to increase motivation and adherence.  Encourage maintenance or resumption of daily activities, including employment, as pain allows and with minimal exposure to trauma.  Assist patient to advocate for adaptations to the work environment.  Consider level of pain and function, gender, age, lifestyle, patient preference, quality of life, readiness and ?ocapacity to benefit? when recommending patients for orthopaedic  surgery consultation.  Explore strategies, such as changes to medication regimen or activity that enables patient to anticipate and manage flare-ups that increase deconditioning and disability.  Explore patient preferences; encourage exposure to a broader range of activities that have been avoided for fear of experiencing pain.  Identify barriers to participation in therapy or exercise, such as pain with activity, anticipated or imagined pain.  Monitor postoperative joint replacement or any  preexisting joint replacement for ongoing pain and loss of function; provide social support and encouragement throughout recovery.   Notes:        Depression Screen    01/12/2023    2:11 PM 10/05/2022    2:09 PM 04/09/2022    1:51 PM 12/20/2017    3:48 PM 09/27/2017    3:22 PM 04/07/2017   10:06 AM  PHQ 2/9 Scores  PHQ - 2 Score 0 2 1 0 0 0  PHQ- 9 Score   5       Fall Risk    07/23/2023    2:56 PM 01/12/2023    2:11 PM 10/05/2022    2:09 PM 04/09/2022    1:47 PM 11/28/2020    8:13 AM  Fall Risk   Falls in the past year? 1 0 0 0 1  Number falls in past yr: 1 0 0 0 0  Injury with Fall? 1 0 0  0  Risk for fall due to : History of fall(s);Impaired balance/gait;Impaired mobility      Follow up Falls evaluation completed;Education provided;Falls prevention discussed        MEDICARE RISK AT HOME: Medicare Risk at Home Any stairs in or around the home?: No If so, are there any without handrails?: No Home free of loose throw rugs in walkways, pet beds, electrical cords, etc?: Yes Adequate lighting in your home to reduce risk of falls?: Yes Life alert?: No Use of a cane, walker or w/c?: Yes Grab bars in the bathroom?: Yes Shower chair or bench in shower?: Yes Elevated toilet seat or a handicapped toilet?: Yes  TIMED UP AND GO:  Was the test performed?  No    Cognitive Function:    07/23/2023    2:57 PM  MMSE - Mini Mental State Exam  Orientation to time 5  Orientation to Place 5  Registration 3  Attention/ Calculation 5  Recall 1  Language- name 2 objects 2  Language- repeat 1  Language- follow 3 step command 3  Language- read & follow direction 1  Write a sentence 1  Copy design 1  Total score 28        Immunizations Immunization History  Administered Date(s) Administered   Influenza Split 08/13/2017   Influenza, High Dose Seasonal PF 09/03/2015, 08/05/2016, 08/31/2018, 07/26/2019   Influenza, Quadrivalent, Recombinant, Inj, Pf 08/31/2018, 07/26/2019    Influenza,trivalent, recombinat, inj, PF 08/31/2018   Influenza-Unspecified 08/27/2012, 09/05/2013, 08/26/2016   PFIZER(Purple Top)SARS-COV-2 Vaccination 11/27/2019, 12/18/2019   PNEUMOCOCCAL CONJUGATE-20 02/05/2022   Pneumococcal Conjugate-13 10/05/2013, 07/26/2019   Pneumococcal Polysaccharide-23 03/25/2003   Td 03/25/2003   Tdap 03/25/2003   Zoster Recombinant(Shingrix) 12/27/2018, 07/26/2019    TDAP status: Due, Education has been provided regarding the importance of this vaccine. Advised may receive this vaccine at local pharmacy or Health Dept. Aware to provide a copy of the vaccination record if obtained from local pharmacy or Health Dept. Verbalized acceptance and understanding.  Flu Vaccine status: Due, Education has been provided regarding the importance of this vaccine. Advised may receive this vaccine at  local pharmacy or Health Dept. Aware to provide a copy of the vaccination record if obtained from local pharmacy or Health Dept. Verbalized acceptance and understanding.  Pneumococcal vaccine status: Up to date  Covid-19 vaccine status: Completed vaccines  Qualifies for Shingles Vaccine? Yes   Zostavax completed No   Shingrix Completed?: Yes  Screening Tests Health Maintenance  Topic Date Due   DEXA SCAN  Never done   DTaP/Tdap/Td (3 - Td or Tdap) 03/24/2013   COVID-19 Vaccine (3 - Pfizer risk series) 01/15/2020   INFLUENZA VACCINE  08/26/2024 (Originally 06/03/2023)   Medicare Annual Wellness (AWV)  07/22/2024   Colonoscopy  06/03/2025   Pneumonia Vaccine 21+ Years old  Completed   Zoster Vaccines- Shingrix  Completed   HPV VACCINES  Aged Out    Health Maintenance  Health Maintenance Due  Topic Date Due   DEXA SCAN  Never done   DTaP/Tdap/Td (3 - Td or Tdap) 03/24/2013   COVID-19 Vaccine (3 - Pfizer risk series) 01/15/2020    Colorectal cancer screening: No longer required.   Mammogram status: No longer required due to advanced age.  Bone Density status:  Completed 05/19/2018. Results reflect: Bone density results: OSTEOPENIA. Repeat every 2 years.  Lung Cancer Screening: (Low Dose CT Chest recommended if Age 68-80 years, 20 pack-year currently smoking OR have quit w/in 15years.) does not qualify.   Lung Cancer Screening Referral: No  Additional Screening:  Hepatitis C Screening: does not qualify; Completed   Vision Screening: Recommended annual ophthalmology exams for early detection of glaucoma and other disorders of the eye. Is the patient up to date with their annual eye exam?  No  Who is the provider or what is the name of the office in which the patient attends annual eye exams? [facility] [place] If pt is not established with a provider, would they like to be referred to a provider to establish care? No .   Dental Screening: Recommended annual dental exams for proper oral hygiene  Diabetic Foot Exam: Diabetic Foot Exam: Completed 07/23/2023  Community Resource Referral / Chronic Care Management: CRR required this visit?  No   CCM required this visit?  No     Plan:     I have personally reviewed and noted the following in the patient's chart:   Medical and social history Use of alcohol, tobacco or illicit drugs  Current medications and supplements including opioid prescriptions. Patient is not currently taking opioid prescriptions. Functional ability and status Nutritional status Physical activity Advanced directives List of other physicians Hospitalizations, surgeries, and ER visits in previous 12 months Vitals Screenings to include cognitive, depression, and falls Referrals and appointments  In addition, I have reviewed and discussed with patient certain preventive protocols, quality metrics, and best practice recommendations. A written personalized care plan for preventive services as well as general preventive health recommendations were provided to patient.     Octavia Heir, NP   07/23/2023    After Visit Summary: (MyChart) Due to this being a telephonic visit, the after visit summary with patients personalized plan was offered to patient via MyChart   Nurse Notes: Moved to assisted living at [facility] 07/21/2023, UTD on vaccinations except Tdap/flu/covid, MMSE 28/30, ambulates with walker> last bone density 2019. Family out of town> will ask about bone density next encounter.

## 2023-07-27 DIAGNOSIS — M6281 Muscle weakness (generalized): Secondary | ICD-10-CM | POA: Diagnosis not present

## 2023-07-27 DIAGNOSIS — I679 Cerebrovascular disease, unspecified: Secondary | ICD-10-CM | POA: Diagnosis not present

## 2023-07-27 DIAGNOSIS — I69911 Memory deficit following unspecified cerebrovascular disease: Secondary | ICD-10-CM | POA: Diagnosis not present

## 2023-07-27 DIAGNOSIS — N3020 Other chronic cystitis without hematuria: Secondary | ICD-10-CM | POA: Diagnosis not present

## 2023-07-29 ENCOUNTER — Encounter: Payer: Self-pay | Admitting: Internal Medicine

## 2023-07-29 ENCOUNTER — Non-Acute Institutional Stay: Payer: Medicare Other | Admitting: Internal Medicine

## 2023-07-29 DIAGNOSIS — G4733 Obstructive sleep apnea (adult) (pediatric): Secondary | ICD-10-CM

## 2023-07-29 DIAGNOSIS — E876 Hypokalemia: Secondary | ICD-10-CM

## 2023-07-29 DIAGNOSIS — F418 Other specified anxiety disorders: Secondary | ICD-10-CM

## 2023-07-29 DIAGNOSIS — Z171 Estrogen receptor negative status [ER-]: Secondary | ICD-10-CM

## 2023-07-29 DIAGNOSIS — E039 Hypothyroidism, unspecified: Secondary | ICD-10-CM | POA: Diagnosis not present

## 2023-07-29 DIAGNOSIS — Z8673 Personal history of transient ischemic attack (TIA), and cerebral infarction without residual deficits: Secondary | ICD-10-CM

## 2023-07-29 DIAGNOSIS — I10 Essential (primary) hypertension: Secondary | ICD-10-CM

## 2023-07-29 DIAGNOSIS — K219 Gastro-esophageal reflux disease without esophagitis: Secondary | ICD-10-CM

## 2023-07-29 DIAGNOSIS — C50212 Malignant neoplasm of upper-inner quadrant of left female breast: Secondary | ICD-10-CM | POA: Diagnosis not present

## 2023-07-29 NOTE — Progress Notes (Signed)
Provider:  Dr. Einar Crow Location:  [facility] Room Number: [id number] Place of Service:  ALF ([phone])  PCP: Mahlon Gammon, MD Patient Care Team: Mahlon Gammon, MD as PCP - General (Internal Medicine)  Extended Emergency Contact Information Primary Emergency Contact: Angelique Blonder, [place] Home Phone: [phone] Mobile Phone: [phone] Relation: Daughter Secondary Emergency Contact: Chrystie Nose,  [place] Home Phone: [phone] Mobile Phone: [phone] Relation: Daughter  Code Status: DNR Goals of Care: Advanced Directive information    07/29/2023   12:40 PM  Advanced Directives  Does Patient Have a Medical Advance Directive? Yes  Type of Estate agent of [place];Out of facility DNR (pink MOST or yellow form)  Does patient want to make changes to medical advance directive? No - Patient declined  Copy of Healthcare Power of Attorney in Chart? Yes - validated most recent copy scanned in chart (See row information)      Chief Complaint  Patient presents with   Admission    Admission.     HPI: Patient is a 82 y.o. female seen today for admission to [facility]  Patient is a recent admit to AL  She was in the hospital from 8/21 to 8/23.  Acute encephalopathy A detailed workup including CBC CMP UA CT of the head MRI of the brain EEG were all negative she was discharged with a baseline mental status  Patient does have a history of hypertension  History of CVA Bilateral Basal Ganglia Infarct Also has history of hyperlipidemia and hypothyroidism  Depression  History of triple negative invasive ductal carcinoma of left breast S/p partial mastectomy in 2018 followed by chemo and radiation.  She continues to have a Port-A-Cath which gets flushed every few months by oncology  The nurses had no complaints.  Patient walks with her walker did have a  very flat effect.  Her only complaint was reflux-like symptoms.  She also wanted to know if some of her medications can be decreased.  She did not want to take potassium with it makes her reflux worse. Past Medical History:  Diagnosis Date   Allergy    Anemia    Anxiety    Asthma    in past, no inhalers now   Blood transfusion without reported diagnosis    Breast cancer (HCC) 01/2017   left breast    Breast cancer of upper-inner quadrant of left female breast (HCC) 03/11/2017   Cancer (HCC) 01/2017   left breast   Cataract    bilateral removed   Chronic kidney disease    kidney stones   Depression 08/31/2013   Dyslipidemia, goal LDL below 160 08/31/2013   Essential hypertension - well controlled 08/31/2013   Family history of breast cancer    Family history of melanoma    Family history of prostate cancer    GERD (gastroesophageal reflux disease)    Goals of care, counseling/discussion 03/11/2017   History of radiation therapy 10/13/17-11/11/17   left breast 40.05 Gy in 15 fractions, left breast boost 10 Gy in 5 fractions   Hypothyroidism    Ischemic stroke [facility])    May 2023;   Obesity (BMI 30-39.9) 08/31/2013   Personal history of chemotherapy 2018   Personal history of radiation therapy 2019   Thyroid disease    Vasovagal near-syncope -rare 08/31/2013   Past Surgical  History:  Procedure Laterality Date   ABDOMINAL HYSTERECTOMY     total   BREAST BIOPSY Left 02/24/2017    malignant   BREAST LUMPECTOMY Left 03/23/2017   broken fingers     CATARACT EXTRACTION Bilateral    COLONOSCOPY     COLONOSCOPY WITH PROPOFOL N/A 06/03/2022   Procedure: COLONOSCOPY WITH PROPOFOL;  Surgeon: Jenel Lucks, MD;  Location: [facility];  Service: Gastroenterology;  Laterality: N/A;   ESOPHAGOGASTRODUODENOSCOPY (EGD) WITH PROPOFOL N/A 05/29/2022   Procedure: ESOPHAGOGASTRODUODENOSCOPY (EGD) WITH PROPOFOL;  Surgeon: Beverley Fiedler, MD;  Location: [facility];  Service:  Gastroenterology;  Laterality: N/A;   EXCISIONAL HEMORRHOIDECTOMY     IR FLUORO GUIDE PORT INSERTION RIGHT  08/04/2017   IR REMOVAL TUN ACCESS W/ PORT W/O FL MOD SED  08/04/2017   IR US GUIDE VASC ACCESS RIGHT  08/04/2017   IRRIGATION AND DEBRIDEMENT ABSCESS Left 05/18/2017   Procedure: IRRIGATION AND DEBRIDEMENT LEFT AXILLARY ABSCESS;  Surgeon: Karie Soda, MD;  Location: [facility];  Service: General;  Laterality: Left;   kidney stones lithotripsy     POLYPECTOMY  06/03/2022   Procedure: POLYPECTOMY;  Surgeon: Jenel Lucks, MD;  Location: [facility];  Service: Gastroenterology;;   PORTACATH PLACEMENT Right 03/23/2017   Procedure: INSERTION PORT-A-CATH;  Surgeon: Harriette Bouillon, MD;  Location: [facility];  Service: General;  Laterality: Right;   RADIOACTIVE SEED GUIDED PARTIAL MASTECTOMY WITH AXILLARY SENTINEL LYMPH NODE BIOPSY Left 03/23/2017   Procedure: LEFT BREAST RADIOACTIVE SEED GUIDED PARTIAL MASTECTOMY AND  SENTINEL LYMPH NODE MAPPING;  Surgeon: Harriette Bouillon, MD;  Location: [facility];  Service: General;  Laterality: Left;   ROTATOR CUFF REPAIR     left    TONSILLECTOMY     WISDOM TOOTH EXTRACTION      reports that she has never smoked. She has been exposed to tobacco smoke. She has never used smokeless tobacco. She reports that she does not drink alcohol and does not use drugs. Social History   Socioeconomic History   Marital status: Widowed    Spouse name: Not on file   Number of children: 3   Years of education: Not on file   Highest education level: Not on file  Occupational History   Occupation: retired   Tobacco Use   Smoking status: Never    Passive exposure: Past   Smokeless tobacco: Never   Tobacco comments:    Secondhand exposure, husband was heavy smoker  Vaping Use   Vaping status: Never Used  Substance and Sexual Activity   Alcohol use: No   Drug use: No   Sexual activity: Not on file  Other Topics Concern   Not on file   Social History Narrative   Married mother of 3 with one grandchild. Never smoked. Does not drink alcohol.Walks 3-4 days a week for roughly 20-30 minutes.      Update 04/09/2023   Lives at [facility]   Right handed   Social Determinants of Health   Financial Resource Strain: Low Risk  (07/23/2023)   Overall Financial Resource Strain (CARDIA)    Difficulty of Paying Living Expenses: Not hard at all  Food Insecurity: No Food Insecurity (07/23/2023)   Hunger Vital Sign    Worried About Running Out of Food in the Last Year: Never true    Ran Out of Food in the Last Year: Never true  Transportation Needs: No Transportation Needs (07/23/2023)   PRAPARE - Transportation  Lack of Transportation (Medical): No    Lack of Transportation (Non-Medical): No  Physical Activity: Insufficiently Active (07/23/2023)   Exercise Vital Sign    Days of Exercise per Week: 7 days    Minutes of Exercise per Session: 10 min  Stress: No Stress Concern Present (07/23/2023)   [employer] of Occupational Health - Occupational Stress Questionnaire    Feeling of Stress : Only a little  Social Connections: Socially Isolated (07/23/2023)   Social Connection and Isolation Panel [NHANES]    Frequency of Communication with Friends and Family: More than three times a week    Frequency of Social Gatherings with Friends and Family: More than three times a week    Attends Religious Services: Never    Database administrator or Organizations: No    Attends Banker Meetings: Never    Marital Status: Widowed  Intimate Partner Violence: Not At Risk (07/23/2023)   Humiliation, Afraid, Rape, and Kick questionnaire    Fear of Current or Ex-Partner: No    Emotionally Abused: No    Physically Abused: No    Sexually Abused: No    Functional Status Survey:    Family History  Problem Relation Age of Onset   Heart disease Mother        Angina   Heart disease Father    Heart attack Brother         Died of MI in his mid 29s   Prostate cancer Brother        dx in his early 39s   Cervical cancer Maternal Aunt    Breast cancer Paternal Aunt        dx over 78   Bone cancer Cousin        maternal first cousin dx in her 30s-60s   Lung cancer Cousin        paternal first cousin   Colon cancer Neg Hx    Pancreatic cancer Neg Hx    Stomach cancer Neg Hx    Esophageal cancer Neg Hx    Rectal cancer Neg Hx    Seizures Neg Hx     Health Maintenance  Topic Date Due   DEXA SCAN  Never done   COVID-19 Vaccine (3 - Pfizer risk series) 01/15/2020   INFLUENZA VACCINE  08/26/2024 (Originally 06/03/2023)   Medicare Annual Wellness (AWV)  07/22/2024   Colonoscopy  06/03/2025   DTaP/Tdap/Td (4 - Td or Tdap) 07/10/2032   Pneumonia Vaccine 80+ Years old  Completed   Zoster Vaccines- Shingrix  Completed   HPV VACCINES  Aged Out    Allergies  Allergen Reactions   Benazepril Anaphylaxis    Possible cause of tongue swelling   Penicillins Anaphylaxis    Has patient had a PCN reaction causing immediate rash, facial/tongue/throat swelling, SOB or lightheadedness with hypotension: yes, Has patient had a PCN reaction causing severe rash involving mucus membranes or skin necrosis:  no, Has patient had a PCN reaction that required hospitalization:  no, Has patient had a PCN reaction occurring within the last 10 years: no, If all of the above answers are "NO", then may proceed with Cephalosporin use.   Advil [Ibuprofen] Itching and Rash    Rash that turns to blisters   Codeine Hives    hives   Crestor [Rosuvastatin Calcium] Other (See Comments)    Pain in joints   Sulfamethoxazole-Trimethoprim Hives    hives   Bacitracin-Polymyxin B Dermatitis and Rash   Other Rash  Band-aid    Outpatient Encounter Medications as of 07/29/2023  Medication Sig   acetaminophen (TYLENOL) 500 MG tablet Take 500 mg by mouth every 6 (six) hours as needed.   amLODipine (NORVASC) 5 MG tablet Take 5 mg by mouth  daily.   aspirin 81 MG chewable tablet Chew 1 tablet (81 mg total) by mouth daily.   atorvastatin (LIPITOR) 80 MG tablet Take 1 tablet (80 mg total) by mouth at bedtime.   cephALEXin (KEFLEX) 250 MG capsule Take 250 mg by mouth daily.   cholecalciferol (VITAMIN D3) 25 MCG (1000 UNIT) tablet Take 1 tablet (1,000 Units total) by mouth daily.   ciclopirox (PENLAC) 8 % solution APPLY TOPICALLY TO AFFECTED NAIL(S) AT BEDTIME. APPLY OVER NAIL & SURROUNDING SKIN. APPLY DAILY OVER PREVIOUS COAT. **AFTER 7 DAYS, MAY REMOVE WITH ALCOHOL AND CONTINUE CYCLE.**   Cranberry 500 MG CHEW Chew 1,000 mg by mouth daily.   docusate sodium (COLACE) 100 MG capsule Take 100 mg by mouth 2 (two) times daily.   estradiol (ESTRACE) 0.1 MG/GM vaginal cream Place 1 Applicatorful vaginally daily. Every Tuesday and Friday   ferrous sulfate 325 (65 FE) MG tablet Take 1 tablet (325 mg total) by mouth daily with breakfast.   fexofenadine (ALLEGRA) 180 MG tablet Take 180 mg by mouth daily.   hydrochlorothiazide (HYDRODIURIL) 25 MG tablet Take 25 mg by mouth daily.   hydrocortisone (ANUSOL-HC) 2.5 % rectal cream Apply 1 Application topically 2 (two) times daily.   L-Lysine 1000 MG TABS Take 1,000 mg by mouth as needed. As needed for fever blister   lactulose (CHRONULAC) 10 GM/15ML solution Take 45 mLs (30 g total) by mouth 2 (two) times daily as needed for mild constipation or moderate constipation.   levothyroxine (SYNTHROID) 75 MCG tablet Take 1 tablet (75 mcg total) by mouth daily.   losartan (COZAAR) 50 MG tablet Take 50 mg by mouth daily.   MAGNESIUM GLYCINATE PO Take 1 each by mouth daily. gummy   Multiple Vitamins-Minerals (CENTRUM ADULTS MULTIGUMMIES PO) Take 2 each by mouth daily.   nystatin powder Apply 1 Application topically 2 (two) times daily. 60 g to groin folds twice daily   OLANZapine (ZYPREXA) 2.5 MG tablet Take 2.5 mg by mouth daily.   omeprazole (PRILOSEC) 20 MG capsule Take 40 mg by mouth daily.   potassium  chloride (KLOR-CON M) 10 MEQ tablet Take 10 mEq by mouth 2 (two) times daily.   venlafaxine XR (EFFEXOR-XR) 75 MG 24 hr capsule Take 3 capsules (225 mg total) by mouth daily.   Zinc Oxide 10 % OINT Apply 1 Application topically See admin instructions. Provide and apply to vaginal area 3 times daily as needed for itching,burning,moisture   [DISCONTINUED] ascorbic acid (VITAMIN C) 500 MG tablet Take 1 tablet (500 mg total) by mouth daily.   [DISCONTINUED] Vitamin D, Ergocalciferol, (DRISDOL) 1.25 MG (50000 UNIT) CAPS capsule Take 1 capsule (50,000 Units total) by mouth every 7 (seven) days.   No facility-administered encounter medications on file as of 07/29/2023.    Review of Systems  Constitutional:  Negative for activity change and appetite change.  HENT: Negative.    Respiratory:  Negative for cough and shortness of breath.   Cardiovascular:  Negative for leg swelling.  Gastrointestinal:  Positive for constipation.  Genitourinary: Negative.   Musculoskeletal:  Positive for gait problem. Negative for arthralgias and myalgias.  Skin: Negative.   Neurological:  Negative for dizziness and weakness.  Psychiatric/Behavioral:  Positive for dysphoric mood. Negative for  confusion and sleep disturbance.     Vitals:   07/29/23 1231  BP: 118/67  Pulse: 70  Resp: 18  Temp: (!) 97.5 F (36.4 C)  SpO2: 95%  Weight: 190 lb 9.6 oz (86.5 kg)  Height: 5\' 3"  (1.6 m)   Body mass index is 33.76 kg/m. Physical Exam Vitals reviewed.  Constitutional:      Appearance: Normal appearance.  HENT:     Head: Normocephalic.     Nose: Nose normal.     Mouth/Throat:     Mouth: Mucous membranes are moist.     Pharynx: Oropharynx is clear.  Eyes:     Pupils: Pupils are equal, round, and reactive to light.  Cardiovascular:     Rate and Rhythm: Normal rate and regular rhythm.     Pulses: Normal pulses.     Heart sounds: Normal heart sounds. No murmur heard. Pulmonary:     Effort: Pulmonary effort is  normal.     Breath sounds: Normal breath sounds.  Abdominal:     General: Abdomen is flat. Bowel sounds are normal.     Palpations: Abdomen is soft.  Musculoskeletal:        General: No swelling.     Cervical back: Neck supple.  Skin:    General: Skin is warm.  Neurological:     General: No focal deficit present.     Mental Status: She is alert and oriented to person, place, and time.  Psychiatric:        Mood and Affect: Mood normal.        Thought Content: Thought content normal.     Labs reviewed: Basic Metabolic Panel: Recent Labs    04/07/23 0736 05/31/23 1430 06/23/23 1823 06/23/23 2023 06/24/23 1640  NA 136 139 140 138 139  K 3.7 3.2* 3.7 3.6 4.0  CL 97* 99 99  --  97*  CO2 26 30 32  --  28  GLUCOSE 102* 117* 91  --  149*  BUN 10 12 9   --  8  CREATININE 0.72 0.64 0.63  --  0.75  CALCIUM 9.4 9.5 9.7  --  9.5  MG 1.8  --   --   --   --    Liver Function Tests: Recent Labs    04/07/23 0736 05/31/23 1430 06/23/23 1823  AST 22 18 23   ALT 23 23 27   ALKPHOS 54 70 68  BILITOT 0.6 0.3 0.6  PROT 6.2* 6.4* 7.0  ALBUMIN 3.4* 4.0 4.2   No results for input(s): "LIPASE", "AMYLASE" in the last 8760 hours. Recent Labs    06/23/23 2012  AMMONIA 21   CBC: Recent Labs    11/27/22 1322 11/27/22 1322 04/07/23 0736 05/31/23 1430 06/23/23 1823 06/23/23 2023 06/24/23 1640 06/25/23 0553  WBC 5.6   < > 6.0 4.8 5.4  --  5.0 4.9  NEUTROABS 3.2  --  4.4 3.1  --   --   --   --   HGB 13.5   < > 12.6 12.3 13.6 12.6 13.1 12.7  HCT 40.5  --  37.5 37.4 40.6 37.0 38.8 39.0  MCV 100.2*  --  101.6* 102.7* 101.2*  --  99.0 101.0*  PLT 245   < > 200 225 234  --  229 224   < > = values in this interval not displayed.   Cardiac Enzymes: No results for input(s): "CKTOTAL", "CKMB", "CKMBINDEX", "TROPONINI" in the last 8760 hours. BNP: Invalid input(s): "POCBNP" Lab Results  Component Value Date   HGBA1C 5.6 03/21/2022   Lab Results  Component Value Date   TSH 0.567  06/23/2023   Lab Results  Component Value Date   VITAMINB12 349 04/07/2023   Lab Results  Component Value Date   FOLATE 18.8 04/07/2023   Lab Results  Component Value Date   IRON 18 (L) 05/28/2022   TIBC 441 05/28/2022   FERRITIN 7 (L) 05/28/2022    Imaging and Procedures obtained prior to SNF admission: MR BRAIN WO CONTRAST  Result Date: 06/24/2023 CLINICAL DATA:  Altered mental status EXAM: MRI HEAD WITHOUT CONTRAST TECHNIQUE: Multiplanar, multiecho pulse sequences of the brain and surrounding structures were obtained without intravenous contrast. COMPARISON:  04/07/2023 MRI, correlation is also made with 06/23/2023 CT head FINDINGS: Brain: No restricted diffusion to suggest acute or subacute infarct. No acute hemorrhage, mass, mass effect, or midline shift. No hydrocephalus or extra-axial collection. Normal pituitary and craniocervical junction. Remote infarcts in the bilateral basal ganglia, with associated hemosiderin deposition and ex vacuo dilatation the right greater than left lateral ventricle. T2 hyperintense signal in the periventricular white matter, likely the sequela of chronic small vessel ischemic disease. Vascular: Normal arterial flow voids. Skull and upper cervical spine: Normal marrow signal. Sinuses/Orbits: Clear paranasal sinuses. No acute finding in the orbits. Status post bilateral lens replacements. Other: The mastoid air cells are well aerated. IMPRESSION: No acute intracranial process. No evidence of acute or subacute infarct. Electronically Signed   By: Wiliam Ke M.D.   On: 06/24/2023 23:52   CT ABDOMEN PELVIS W CONTRAST  Result Date: 06/23/2023 CLINICAL DATA:  Abdominal pain, acute, nonlocalized. EXAM: CT ABDOMEN AND PELVIS WITH CONTRAST TECHNIQUE: Multidetector CT imaging of the abdomen and pelvis was performed using the standard protocol following bolus administration of intravenous contrast. RADIATION DOSE REDUCTION: This exam was performed according to the  departmental dose-optimization program which includes automated exposure control, adjustment of the mA and/or kV according to patient size and/or use of iterative reconstruction technique. CONTRAST:  85mL OMNIPAQUE IOHEXOL 300 MG/ML  SOLN COMPARISON:  06/10/2022 FINDINGS: Lower chest: No acute abnormality Hepatobiliary: No focal hepatic abnormality. Gallbladder unremarkable. Pancreas: No focal abnormality or ductal dilatation. Spleen: No focal abnormality.  Normal size. Adrenals/Urinary Tract: No adrenal abnormality. No focal renal abnormality. No stones or hydronephrosis. Urinary bladder is unremarkable. Stomach/Bowel: Stomach, large and small bowel grossly unremarkable. Vascular/Lymphatic: No evidence of aneurysm or adenopathy. Aortic atherosclerosis. Reproductive: Prior hysterectomy.  No adnexal masses. Other: No free fluid or free air. Musculoskeletal: No acute bony abnormality. IMPRESSION: No acute findings in the abdomen or pelvis. Aortic atherosclerosis. Electronically Signed   By: Charlett Nose M.D.   On: 06/23/2023 22:22   CT Head Wo Contrast  Result Date: 06/23/2023 CLINICAL DATA:  Mental status change EXAM: CT HEAD WITHOUT CONTRAST TECHNIQUE: Contiguous axial images were obtained from the base of the skull through the vertex without intravenous contrast. RADIATION DOSE REDUCTION: This exam was performed according to the departmental dose-optimization program which includes automated exposure control, adjustment of the mA and/or kV according to patient size and/or use of iterative reconstruction technique. COMPARISON:  Head CT 04/07/2023 FINDINGS: Brain: No evidence of acute infarction, hemorrhage, hydrocephalus, extra-axial collection or mass lesion/mass effect. Again seen is mild diffuse atrophy. There is an old lacunar infarct in the left corona radiata and right basal ganglia. Vascular: Atherosclerotic calcifications are present within the cavernous internal carotid arteries. Skull: Normal. Negative  for fracture or focal lesion. Sinuses/Orbits: No acute finding. Other: None.  IMPRESSION: 1. No acute intracranial process. 2. Mild diffuse atrophy. 3. Old lacunar infarcts in the left corona radiata and right basal ganglia. Electronically Signed   By: Darliss Cheney M.D.   On: 06/23/2023 20:42   DG Chest Portable 1 View  Result Date: 06/23/2023 CLINICAL DATA:  Altered mental status. EXAM: PORTABLE CHEST 1 VIEW COMPARISON:  Mar 07, 2018 FINDINGS: There is stable right-sided venous Port-A-Cath positioning. The heart size and mediastinal contours are within normal limits. There is no evidence of an acute infiltrate, pleural effusion or pneumothorax. Radiopaque surgical clips are seen within the soft tissues of the left breast. Degenerative changes seen throughout the thoracic spine. IMPRESSION: No active cardiopulmonary disease. Electronically Signed   By: Aram Candela M.D.   On: 06/23/2023 20:17    Assessment/Plan 1. Essential hypertension - well controlled Will Change hydrochlorothiazide to 12.5 mg so we can reduce her Potassium As sh e ode snot wan to  take it Check BP every day for 1 week If BP elevated can increase her Cozaar  2. Hypokalemia Will Change Potassium to 10 QD  3. Gastroesophageal reflux disease, unspecified whether esophagitis present Change Omeprazole to BID to help her Reflux  4. Acquired hypothyroidism TSH normal level in 8/24  5. Obstructive sleep apnea Intolerant to CPAP  6. Depression with anxiety On high dose of Effexor and Low dos eof Zyprexa  7. H/O TIA (transient ischemic attack) and stroke Aspirin and Statin  8. Malignant neoplasm of upper-inner quadrant of left breast in female, estrogen receptor negative (HCC) Follows with Oncology 9 Recurent UTI  On Keflex Prophylaxis and cranberry 10 Anemia AS she wants her dose reduced will change it to 3/week HGB Normal   Family/ staff Communication:   Labs/tests ordered:

## 2023-08-02 ENCOUNTER — Inpatient Hospital Stay: Payer: Medicare Other

## 2023-08-02 DIAGNOSIS — I679 Cerebrovascular disease, unspecified: Secondary | ICD-10-CM | POA: Diagnosis not present

## 2023-08-02 DIAGNOSIS — I69911 Memory deficit following unspecified cerebrovascular disease: Secondary | ICD-10-CM | POA: Diagnosis not present

## 2023-08-02 DIAGNOSIS — M6281 Muscle weakness (generalized): Secondary | ICD-10-CM | POA: Diagnosis not present

## 2023-08-02 DIAGNOSIS — N3020 Other chronic cystitis without hematuria: Secondary | ICD-10-CM | POA: Diagnosis not present

## 2023-08-04 DIAGNOSIS — I69911 Memory deficit following unspecified cerebrovascular disease: Secondary | ICD-10-CM | POA: Diagnosis not present

## 2023-08-04 DIAGNOSIS — N3020 Other chronic cystitis without hematuria: Secondary | ICD-10-CM | POA: Diagnosis not present

## 2023-08-04 DIAGNOSIS — I679 Cerebrovascular disease, unspecified: Secondary | ICD-10-CM | POA: Diagnosis not present

## 2023-08-04 DIAGNOSIS — M6281 Muscle weakness (generalized): Secondary | ICD-10-CM | POA: Diagnosis not present

## 2023-08-06 DIAGNOSIS — M6281 Muscle weakness (generalized): Secondary | ICD-10-CM | POA: Diagnosis not present

## 2023-08-06 DIAGNOSIS — I679 Cerebrovascular disease, unspecified: Secondary | ICD-10-CM | POA: Diagnosis not present

## 2023-08-06 DIAGNOSIS — N3020 Other chronic cystitis without hematuria: Secondary | ICD-10-CM | POA: Diagnosis not present

## 2023-08-06 DIAGNOSIS — I69911 Memory deficit following unspecified cerebrovascular disease: Secondary | ICD-10-CM | POA: Diagnosis not present

## 2023-08-09 DIAGNOSIS — I69911 Memory deficit following unspecified cerebrovascular disease: Secondary | ICD-10-CM | POA: Diagnosis not present

## 2023-08-09 DIAGNOSIS — M6281 Muscle weakness (generalized): Secondary | ICD-10-CM | POA: Diagnosis not present

## 2023-08-09 DIAGNOSIS — I679 Cerebrovascular disease, unspecified: Secondary | ICD-10-CM | POA: Diagnosis not present

## 2023-08-09 DIAGNOSIS — N3020 Other chronic cystitis without hematuria: Secondary | ICD-10-CM | POA: Diagnosis not present

## 2023-08-10 DIAGNOSIS — M6281 Muscle weakness (generalized): Secondary | ICD-10-CM | POA: Diagnosis not present

## 2023-08-10 DIAGNOSIS — I679 Cerebrovascular disease, unspecified: Secondary | ICD-10-CM | POA: Diagnosis not present

## 2023-08-10 DIAGNOSIS — N3020 Other chronic cystitis without hematuria: Secondary | ICD-10-CM | POA: Diagnosis not present

## 2023-08-10 DIAGNOSIS — I69911 Memory deficit following unspecified cerebrovascular disease: Secondary | ICD-10-CM | POA: Diagnosis not present

## 2023-08-12 ENCOUNTER — Inpatient Hospital Stay: Payer: Medicare Other | Attending: Hematology & Oncology

## 2023-08-12 VITALS — BP 135/58 | HR 75 | Resp 17

## 2023-08-12 DIAGNOSIS — Z853 Personal history of malignant neoplasm of breast: Secondary | ICD-10-CM | POA: Diagnosis not present

## 2023-08-12 DIAGNOSIS — Z170 Estrogen receptor positive status [ER+]: Secondary | ICD-10-CM

## 2023-08-12 MED ORDER — HEPARIN SOD (PORK) LOCK FLUSH 100 UNIT/ML IV SOLN
500.0000 [IU] | Freq: Once | INTRAVENOUS | Status: AC | PRN
  Administered 2023-08-12: 500 [IU]

## 2023-08-12 MED ORDER — SODIUM CHLORIDE 0.9% FLUSH
10.0000 mL | INTRAVENOUS | Status: DC | PRN
  Administered 2023-08-12: 10 mL

## 2023-08-12 NOTE — Patient Instructions (Signed)

## 2023-08-13 DIAGNOSIS — M6281 Muscle weakness (generalized): Secondary | ICD-10-CM | POA: Diagnosis not present

## 2023-08-13 DIAGNOSIS — I639 Cerebral infarction, unspecified: Secondary | ICD-10-CM | POA: Diagnosis not present

## 2023-08-13 DIAGNOSIS — I69911 Memory deficit following unspecified cerebrovascular disease: Secondary | ICD-10-CM | POA: Diagnosis not present

## 2023-08-13 DIAGNOSIS — I679 Cerebrovascular disease, unspecified: Secondary | ICD-10-CM | POA: Diagnosis not present

## 2023-08-13 DIAGNOSIS — R4789 Other speech disturbances: Secondary | ICD-10-CM | POA: Diagnosis not present

## 2023-08-13 DIAGNOSIS — R498 Other voice and resonance disorders: Secondary | ICD-10-CM | POA: Diagnosis not present

## 2023-08-13 DIAGNOSIS — H903 Sensorineural hearing loss, bilateral: Secondary | ICD-10-CM | POA: Diagnosis not present

## 2023-08-13 DIAGNOSIS — N3020 Other chronic cystitis without hematuria: Secondary | ICD-10-CM | POA: Diagnosis not present

## 2023-08-16 DIAGNOSIS — N3020 Other chronic cystitis without hematuria: Secondary | ICD-10-CM | POA: Diagnosis not present

## 2023-08-16 DIAGNOSIS — I69911 Memory deficit following unspecified cerebrovascular disease: Secondary | ICD-10-CM | POA: Diagnosis not present

## 2023-08-16 DIAGNOSIS — I679 Cerebrovascular disease, unspecified: Secondary | ICD-10-CM | POA: Diagnosis not present

## 2023-08-16 DIAGNOSIS — M6281 Muscle weakness (generalized): Secondary | ICD-10-CM | POA: Diagnosis not present

## 2023-08-17 DIAGNOSIS — R4789 Other speech disturbances: Secondary | ICD-10-CM | POA: Diagnosis not present

## 2023-08-17 DIAGNOSIS — I69911 Memory deficit following unspecified cerebrovascular disease: Secondary | ICD-10-CM | POA: Diagnosis not present

## 2023-08-17 DIAGNOSIS — I639 Cerebral infarction, unspecified: Secondary | ICD-10-CM | POA: Diagnosis not present

## 2023-08-17 DIAGNOSIS — M6281 Muscle weakness (generalized): Secondary | ICD-10-CM | POA: Diagnosis not present

## 2023-08-17 DIAGNOSIS — R498 Other voice and resonance disorders: Secondary | ICD-10-CM | POA: Diagnosis not present

## 2023-08-17 DIAGNOSIS — I679 Cerebrovascular disease, unspecified: Secondary | ICD-10-CM | POA: Diagnosis not present

## 2023-08-17 DIAGNOSIS — N3020 Other chronic cystitis without hematuria: Secondary | ICD-10-CM | POA: Diagnosis not present

## 2023-08-18 DIAGNOSIS — M6281 Muscle weakness (generalized): Secondary | ICD-10-CM | POA: Diagnosis not present

## 2023-08-18 DIAGNOSIS — N3020 Other chronic cystitis without hematuria: Secondary | ICD-10-CM | POA: Diagnosis not present

## 2023-08-18 DIAGNOSIS — I639 Cerebral infarction, unspecified: Secondary | ICD-10-CM | POA: Diagnosis not present

## 2023-08-18 DIAGNOSIS — R498 Other voice and resonance disorders: Secondary | ICD-10-CM | POA: Diagnosis not present

## 2023-08-18 DIAGNOSIS — R4789 Other speech disturbances: Secondary | ICD-10-CM | POA: Diagnosis not present

## 2023-08-18 DIAGNOSIS — I679 Cerebrovascular disease, unspecified: Secondary | ICD-10-CM | POA: Diagnosis not present

## 2023-08-18 DIAGNOSIS — I69911 Memory deficit following unspecified cerebrovascular disease: Secondary | ICD-10-CM | POA: Diagnosis not present

## 2023-08-19 DIAGNOSIS — N3020 Other chronic cystitis without hematuria: Secondary | ICD-10-CM | POA: Diagnosis not present

## 2023-08-19 DIAGNOSIS — I639 Cerebral infarction, unspecified: Secondary | ICD-10-CM | POA: Diagnosis not present

## 2023-08-19 DIAGNOSIS — I69911 Memory deficit following unspecified cerebrovascular disease: Secondary | ICD-10-CM | POA: Diagnosis not present

## 2023-08-19 DIAGNOSIS — I679 Cerebrovascular disease, unspecified: Secondary | ICD-10-CM | POA: Diagnosis not present

## 2023-08-19 DIAGNOSIS — R4789 Other speech disturbances: Secondary | ICD-10-CM | POA: Diagnosis not present

## 2023-08-19 DIAGNOSIS — E039 Hypothyroidism, unspecified: Secondary | ICD-10-CM | POA: Diagnosis not present

## 2023-08-19 DIAGNOSIS — M6281 Muscle weakness (generalized): Secondary | ICD-10-CM | POA: Diagnosis not present

## 2023-08-19 DIAGNOSIS — E785 Hyperlipidemia, unspecified: Secondary | ICD-10-CM | POA: Diagnosis not present

## 2023-08-19 DIAGNOSIS — I10 Essential (primary) hypertension: Secondary | ICD-10-CM | POA: Diagnosis not present

## 2023-08-19 DIAGNOSIS — E876 Hypokalemia: Secondary | ICD-10-CM | POA: Diagnosis not present

## 2023-08-19 DIAGNOSIS — R498 Other voice and resonance disorders: Secondary | ICD-10-CM | POA: Diagnosis not present

## 2023-08-19 DIAGNOSIS — E612 Magnesium deficiency: Secondary | ICD-10-CM | POA: Diagnosis not present

## 2023-08-23 DIAGNOSIS — R498 Other voice and resonance disorders: Secondary | ICD-10-CM | POA: Diagnosis not present

## 2023-08-23 DIAGNOSIS — R4789 Other speech disturbances: Secondary | ICD-10-CM | POA: Diagnosis not present

## 2023-08-23 DIAGNOSIS — I639 Cerebral infarction, unspecified: Secondary | ICD-10-CM | POA: Diagnosis not present

## 2023-08-24 DIAGNOSIS — R498 Other voice and resonance disorders: Secondary | ICD-10-CM | POA: Diagnosis not present

## 2023-08-24 DIAGNOSIS — I639 Cerebral infarction, unspecified: Secondary | ICD-10-CM | POA: Diagnosis not present

## 2023-08-24 DIAGNOSIS — R4789 Other speech disturbances: Secondary | ICD-10-CM | POA: Diagnosis not present

## 2023-08-25 DIAGNOSIS — N3020 Other chronic cystitis without hematuria: Secondary | ICD-10-CM | POA: Diagnosis not present

## 2023-08-25 DIAGNOSIS — I69911 Memory deficit following unspecified cerebrovascular disease: Secondary | ICD-10-CM | POA: Diagnosis not present

## 2023-08-25 DIAGNOSIS — M6281 Muscle weakness (generalized): Secondary | ICD-10-CM | POA: Diagnosis not present

## 2023-08-25 DIAGNOSIS — I679 Cerebrovascular disease, unspecified: Secondary | ICD-10-CM | POA: Diagnosis not present

## 2023-08-26 DIAGNOSIS — I639 Cerebral infarction, unspecified: Secondary | ICD-10-CM | POA: Diagnosis not present

## 2023-08-26 DIAGNOSIS — R4789 Other speech disturbances: Secondary | ICD-10-CM | POA: Diagnosis not present

## 2023-08-26 DIAGNOSIS — I679 Cerebrovascular disease, unspecified: Secondary | ICD-10-CM | POA: Diagnosis not present

## 2023-08-26 DIAGNOSIS — I69911 Memory deficit following unspecified cerebrovascular disease: Secondary | ICD-10-CM | POA: Diagnosis not present

## 2023-08-26 DIAGNOSIS — M6281 Muscle weakness (generalized): Secondary | ICD-10-CM | POA: Diagnosis not present

## 2023-08-26 DIAGNOSIS — R498 Other voice and resonance disorders: Secondary | ICD-10-CM | POA: Diagnosis not present

## 2023-08-26 DIAGNOSIS — N3020 Other chronic cystitis without hematuria: Secondary | ICD-10-CM | POA: Diagnosis not present

## 2023-08-30 DIAGNOSIS — I679 Cerebrovascular disease, unspecified: Secondary | ICD-10-CM | POA: Diagnosis not present

## 2023-08-30 DIAGNOSIS — M6281 Muscle weakness (generalized): Secondary | ICD-10-CM | POA: Diagnosis not present

## 2023-08-30 DIAGNOSIS — R4789 Other speech disturbances: Secondary | ICD-10-CM | POA: Diagnosis not present

## 2023-08-30 DIAGNOSIS — I639 Cerebral infarction, unspecified: Secondary | ICD-10-CM | POA: Diagnosis not present

## 2023-08-30 DIAGNOSIS — I69911 Memory deficit following unspecified cerebrovascular disease: Secondary | ICD-10-CM | POA: Diagnosis not present

## 2023-08-30 DIAGNOSIS — N3020 Other chronic cystitis without hematuria: Secondary | ICD-10-CM | POA: Diagnosis not present

## 2023-08-30 DIAGNOSIS — R498 Other voice and resonance disorders: Secondary | ICD-10-CM | POA: Diagnosis not present

## 2023-09-01 DIAGNOSIS — I639 Cerebral infarction, unspecified: Secondary | ICD-10-CM | POA: Diagnosis not present

## 2023-09-01 DIAGNOSIS — R4789 Other speech disturbances: Secondary | ICD-10-CM | POA: Diagnosis not present

## 2023-09-01 DIAGNOSIS — R498 Other voice and resonance disorders: Secondary | ICD-10-CM | POA: Diagnosis not present

## 2023-09-02 DIAGNOSIS — I69911 Memory deficit following unspecified cerebrovascular disease: Secondary | ICD-10-CM | POA: Diagnosis not present

## 2023-09-02 DIAGNOSIS — M6281 Muscle weakness (generalized): Secondary | ICD-10-CM | POA: Diagnosis not present

## 2023-09-02 DIAGNOSIS — I679 Cerebrovascular disease, unspecified: Secondary | ICD-10-CM | POA: Diagnosis not present

## 2023-09-02 DIAGNOSIS — N3020 Other chronic cystitis without hematuria: Secondary | ICD-10-CM | POA: Diagnosis not present

## 2023-09-06 DIAGNOSIS — I69911 Memory deficit following unspecified cerebrovascular disease: Secondary | ICD-10-CM | POA: Diagnosis not present

## 2023-09-06 DIAGNOSIS — N3020 Other chronic cystitis without hematuria: Secondary | ICD-10-CM | POA: Diagnosis not present

## 2023-09-06 DIAGNOSIS — I639 Cerebral infarction, unspecified: Secondary | ICD-10-CM | POA: Diagnosis not present

## 2023-09-06 DIAGNOSIS — I679 Cerebrovascular disease, unspecified: Secondary | ICD-10-CM | POA: Diagnosis not present

## 2023-09-06 DIAGNOSIS — M6281 Muscle weakness (generalized): Secondary | ICD-10-CM | POA: Diagnosis not present

## 2023-09-06 DIAGNOSIS — R4789 Other speech disturbances: Secondary | ICD-10-CM | POA: Diagnosis not present

## 2023-09-06 DIAGNOSIS — R498 Other voice and resonance disorders: Secondary | ICD-10-CM | POA: Diagnosis not present

## 2023-09-07 DIAGNOSIS — R4789 Other speech disturbances: Secondary | ICD-10-CM | POA: Diagnosis not present

## 2023-09-07 DIAGNOSIS — R498 Other voice and resonance disorders: Secondary | ICD-10-CM | POA: Diagnosis not present

## 2023-09-07 DIAGNOSIS — I639 Cerebral infarction, unspecified: Secondary | ICD-10-CM | POA: Diagnosis not present

## 2023-09-08 DIAGNOSIS — I679 Cerebrovascular disease, unspecified: Secondary | ICD-10-CM | POA: Diagnosis not present

## 2023-09-08 DIAGNOSIS — I69911 Memory deficit following unspecified cerebrovascular disease: Secondary | ICD-10-CM | POA: Diagnosis not present

## 2023-09-08 DIAGNOSIS — N3020 Other chronic cystitis without hematuria: Secondary | ICD-10-CM | POA: Diagnosis not present

## 2023-09-08 DIAGNOSIS — M6281 Muscle weakness (generalized): Secondary | ICD-10-CM | POA: Diagnosis not present

## 2023-09-09 DIAGNOSIS — N3020 Other chronic cystitis without hematuria: Secondary | ICD-10-CM | POA: Diagnosis not present

## 2023-09-09 DIAGNOSIS — I679 Cerebrovascular disease, unspecified: Secondary | ICD-10-CM | POA: Diagnosis not present

## 2023-09-09 DIAGNOSIS — I69911 Memory deficit following unspecified cerebrovascular disease: Secondary | ICD-10-CM | POA: Diagnosis not present

## 2023-09-09 DIAGNOSIS — M6281 Muscle weakness (generalized): Secondary | ICD-10-CM | POA: Diagnosis not present

## 2023-09-13 DIAGNOSIS — I639 Cerebral infarction, unspecified: Secondary | ICD-10-CM | POA: Diagnosis not present

## 2023-09-13 DIAGNOSIS — N3020 Other chronic cystitis without hematuria: Secondary | ICD-10-CM | POA: Diagnosis not present

## 2023-09-13 DIAGNOSIS — I679 Cerebrovascular disease, unspecified: Secondary | ICD-10-CM | POA: Diagnosis not present

## 2023-09-13 DIAGNOSIS — M6281 Muscle weakness (generalized): Secondary | ICD-10-CM | POA: Diagnosis not present

## 2023-09-13 DIAGNOSIS — R498 Other voice and resonance disorders: Secondary | ICD-10-CM | POA: Diagnosis not present

## 2023-09-13 DIAGNOSIS — R4789 Other speech disturbances: Secondary | ICD-10-CM | POA: Diagnosis not present

## 2023-09-13 DIAGNOSIS — I69911 Memory deficit following unspecified cerebrovascular disease: Secondary | ICD-10-CM | POA: Diagnosis not present

## 2023-09-15 DIAGNOSIS — I69911 Memory deficit following unspecified cerebrovascular disease: Secondary | ICD-10-CM | POA: Diagnosis not present

## 2023-09-15 DIAGNOSIS — N3020 Other chronic cystitis without hematuria: Secondary | ICD-10-CM | POA: Diagnosis not present

## 2023-09-15 DIAGNOSIS — M6281 Muscle weakness (generalized): Secondary | ICD-10-CM | POA: Diagnosis not present

## 2023-09-15 DIAGNOSIS — I679 Cerebrovascular disease, unspecified: Secondary | ICD-10-CM | POA: Diagnosis not present

## 2023-09-17 DIAGNOSIS — I69911 Memory deficit following unspecified cerebrovascular disease: Secondary | ICD-10-CM | POA: Diagnosis not present

## 2023-09-17 DIAGNOSIS — M6281 Muscle weakness (generalized): Secondary | ICD-10-CM | POA: Diagnosis not present

## 2023-09-17 DIAGNOSIS — N3020 Other chronic cystitis without hematuria: Secondary | ICD-10-CM | POA: Diagnosis not present

## 2023-09-17 DIAGNOSIS — I679 Cerebrovascular disease, unspecified: Secondary | ICD-10-CM | POA: Diagnosis not present

## 2023-09-21 DIAGNOSIS — H524 Presbyopia: Secondary | ICD-10-CM | POA: Diagnosis not present

## 2023-09-21 DIAGNOSIS — H35371 Puckering of macula, right eye: Secondary | ICD-10-CM | POA: Diagnosis not present

## 2023-09-21 DIAGNOSIS — H35033 Hypertensive retinopathy, bilateral: Secondary | ICD-10-CM | POA: Diagnosis not present

## 2023-09-21 DIAGNOSIS — H35361 Drusen (degenerative) of macula, right eye: Secondary | ICD-10-CM | POA: Diagnosis not present

## 2023-09-21 DIAGNOSIS — H04123 Dry eye syndrome of bilateral lacrimal glands: Secondary | ICD-10-CM | POA: Diagnosis not present

## 2023-09-21 DIAGNOSIS — I69898 Other sequelae of other cerebrovascular disease: Secondary | ICD-10-CM | POA: Diagnosis not present

## 2023-09-22 DIAGNOSIS — M19011 Primary osteoarthritis, right shoulder: Secondary | ICD-10-CM | POA: Diagnosis not present

## 2023-09-22 DIAGNOSIS — M19012 Primary osteoarthritis, left shoulder: Secondary | ICD-10-CM | POA: Diagnosis not present

## 2023-09-24 DIAGNOSIS — I679 Cerebrovascular disease, unspecified: Secondary | ICD-10-CM | POA: Diagnosis not present

## 2023-09-24 DIAGNOSIS — I69911 Memory deficit following unspecified cerebrovascular disease: Secondary | ICD-10-CM | POA: Diagnosis not present

## 2023-09-24 DIAGNOSIS — N3020 Other chronic cystitis without hematuria: Secondary | ICD-10-CM | POA: Diagnosis not present

## 2023-09-24 DIAGNOSIS — M6281 Muscle weakness (generalized): Secondary | ICD-10-CM | POA: Diagnosis not present

## 2023-09-27 ENCOUNTER — Inpatient Hospital Stay: Payer: Medicare Other | Attending: Hematology & Oncology

## 2023-09-27 VITALS — BP 127/57 | HR 75 | Temp 97.9°F | Resp 20

## 2023-09-27 DIAGNOSIS — R498 Other voice and resonance disorders: Secondary | ICD-10-CM | POA: Diagnosis not present

## 2023-09-27 DIAGNOSIS — I69911 Memory deficit following unspecified cerebrovascular disease: Secondary | ICD-10-CM | POA: Diagnosis not present

## 2023-09-27 DIAGNOSIS — R4789 Other speech disturbances: Secondary | ICD-10-CM | POA: Diagnosis not present

## 2023-09-27 DIAGNOSIS — Z170 Estrogen receptor positive status [ER+]: Secondary | ICD-10-CM

## 2023-09-27 DIAGNOSIS — C50212 Malignant neoplasm of upper-inner quadrant of left female breast: Secondary | ICD-10-CM

## 2023-09-27 DIAGNOSIS — I679 Cerebrovascular disease, unspecified: Secondary | ICD-10-CM | POA: Diagnosis not present

## 2023-09-27 DIAGNOSIS — M6281 Muscle weakness (generalized): Secondary | ICD-10-CM | POA: Diagnosis not present

## 2023-09-27 DIAGNOSIS — Z853 Personal history of malignant neoplasm of breast: Secondary | ICD-10-CM | POA: Diagnosis not present

## 2023-09-27 DIAGNOSIS — I639 Cerebral infarction, unspecified: Secondary | ICD-10-CM | POA: Diagnosis not present

## 2023-09-27 DIAGNOSIS — N3020 Other chronic cystitis without hematuria: Secondary | ICD-10-CM | POA: Diagnosis not present

## 2023-09-27 MED ORDER — HEPARIN SOD (PORK) LOCK FLUSH 100 UNIT/ML IV SOLN
500.0000 [IU] | Freq: Once | INTRAVENOUS | Status: AC | PRN
Start: 2023-09-27 — End: 2023-09-27
  Administered 2023-09-27: 500 [IU]

## 2023-09-27 MED ORDER — SODIUM CHLORIDE 0.9% FLUSH
10.0000 mL | INTRAVENOUS | Status: DC | PRN
  Administered 2023-09-27: 10 mL

## 2023-10-01 DIAGNOSIS — R4789 Other speech disturbances: Secondary | ICD-10-CM | POA: Diagnosis not present

## 2023-10-01 DIAGNOSIS — R498 Other voice and resonance disorders: Secondary | ICD-10-CM | POA: Diagnosis not present

## 2023-10-01 DIAGNOSIS — I639 Cerebral infarction, unspecified: Secondary | ICD-10-CM | POA: Diagnosis not present

## 2023-10-05 DIAGNOSIS — R4789 Other speech disturbances: Secondary | ICD-10-CM | POA: Diagnosis not present

## 2023-10-05 DIAGNOSIS — I639 Cerebral infarction, unspecified: Secondary | ICD-10-CM | POA: Diagnosis not present

## 2023-10-05 DIAGNOSIS — R498 Other voice and resonance disorders: Secondary | ICD-10-CM | POA: Diagnosis not present

## 2023-10-06 ENCOUNTER — Non-Acute Institutional Stay: Payer: Medicare Other | Admitting: Orthopedic Surgery

## 2023-10-06 ENCOUNTER — Encounter: Payer: Self-pay | Admitting: Orthopedic Surgery

## 2023-10-06 DIAGNOSIS — D508 Other iron deficiency anemias: Secondary | ICD-10-CM

## 2023-10-06 DIAGNOSIS — E66812 Obesity, class 2: Secondary | ICD-10-CM

## 2023-10-06 DIAGNOSIS — Z171 Estrogen receptor negative status [ER-]: Secondary | ICD-10-CM | POA: Diagnosis not present

## 2023-10-06 DIAGNOSIS — G4733 Obstructive sleep apnea (adult) (pediatric): Secondary | ICD-10-CM

## 2023-10-06 DIAGNOSIS — N390 Urinary tract infection, site not specified: Secondary | ICD-10-CM

## 2023-10-06 DIAGNOSIS — F419 Anxiety disorder, unspecified: Secondary | ICD-10-CM

## 2023-10-06 DIAGNOSIS — C50212 Malignant neoplasm of upper-inner quadrant of left female breast: Secondary | ICD-10-CM

## 2023-10-06 DIAGNOSIS — R4789 Other speech disturbances: Secondary | ICD-10-CM | POA: Diagnosis not present

## 2023-10-06 DIAGNOSIS — R498 Other voice and resonance disorders: Secondary | ICD-10-CM | POA: Diagnosis not present

## 2023-10-06 DIAGNOSIS — Z6835 Body mass index (BMI) 35.0-35.9, adult: Secondary | ICD-10-CM

## 2023-10-06 DIAGNOSIS — I10 Essential (primary) hypertension: Secondary | ICD-10-CM

## 2023-10-06 DIAGNOSIS — F339 Major depressive disorder, recurrent, unspecified: Secondary | ICD-10-CM

## 2023-10-06 DIAGNOSIS — E039 Hypothyroidism, unspecified: Secondary | ICD-10-CM | POA: Diagnosis not present

## 2023-10-06 DIAGNOSIS — Z8673 Personal history of transient ischemic attack (TIA), and cerebral infarction without residual deficits: Secondary | ICD-10-CM

## 2023-10-06 DIAGNOSIS — K219 Gastro-esophageal reflux disease without esophagitis: Secondary | ICD-10-CM | POA: Diagnosis not present

## 2023-10-06 DIAGNOSIS — I639 Cerebral infarction, unspecified: Secondary | ICD-10-CM | POA: Diagnosis not present

## 2023-10-06 NOTE — Progress Notes (Signed)
Location:  [facility] Room Number: 18/A Place of Service:  [phone]) Provider:  Octavia Heir, NP   Mahlon Gammon, MD  Patient Care Team: Mahlon Gammon, MD as PCP - General (Internal Medicine)  Extended Emergency Contact Information Primary Emergency Contact: Angelique Blonder, [place] Home Phone: [phone] Mobile Phone: [phone] Relation: Daughter Secondary Emergency Contact: Chrystie Nose, [place] Home Phone: [phone] Mobile Phone: [phone] Relation: Daughter  Code Status:  DNR Goals of care: Advanced Directive information    07/29/2023   12:40 PM  Advanced Directives  Does Patient Have a Medical Advance Directive? Yes  Type of Estate agent of [place];Out of facility DNR (pink MOST or yellow form)  Does patient want to make changes to medical advance directive? No - Patient declined  Copy of Healthcare Power of Attorney in Chart? Yes - validated most recent copy scanned in chart (See row information)     Chief Complaint  Patient presents with   Medical Management of Chronic Issues    HPI:  Pt is a 82 y.o. female seen today for medical management of chronic diseases.    She currently resides on the assisted living unit at [facility]. PMH: HTN, HLD, asthma, OSA, GERD, hypothyroidism, acute encephalopathy (hospitalized 0821/2024), left thalamic infarction, osteopenia, depression, iron deficiency anemia, and obesity.   HTN- BUN/creat 8/0.75 06/24/2023, remains on amlodipine, losartan and HCTZ OSA- increased daytime sleepiness per nursing> napping more, noncompliant with CPAP H/o TIA/stroke- left thalamic infarct 03/2022, remains on aspirin and statin GERD- hgb 12.7 06/25/2023, remains on statin Hypothyroidism- TSH 0.567/ T4 0.80 06/23/2023, remains on levothyroxine Depression and anxiety- flat affect, attending activities in  AL, remains on Zyprexa and Effexor Left sided breast cancer- followed by oncology, continues scheduled port flushes Recurrent UTI- remains on keflex daily Anemia- remains on ferrous sulfate  Recent blood pressures:  11/27- 110/65  11/20- 142/75  11/06- 129/71  Recent weights:  11/01- 200.4 lbs  09/17- 190.6 lbs   Past Medical History:  Diagnosis Date   Allergy    Anemia    Anxiety    Asthma    in past, no inhalers now   Blood transfusion without reported diagnosis    Breast cancer (HCC) 01/2017   left breast    Breast cancer of upper-inner quadrant of left female breast (HCC) 03/11/2017   Cancer (HCC) 01/2017   left breast   Cataract    bilateral removed   Chronic kidney disease    kidney stones   Depression 08/31/2013   Dyslipidemia, goal LDL below 160 08/31/2013   Essential hypertension - well controlled 08/31/2013   Family history of breast cancer    Family history of melanoma    Family history of prostate cancer    GERD (gastroesophageal reflux disease)    Goals of care, counseling/discussion 03/11/2017   History of radiation therapy 10/13/17-11/11/17   left breast 40.05 Gy in 15 fractions, left breast boost 10 Gy in 5 fractions   Hypothyroidism    Ischemic stroke [facility])    May 2023;   Obesity (BMI 30-39.9) 08/31/2013   Personal history of chemotherapy 2018   Personal history of radiation therapy 2019   Thyroid disease    Vasovagal near-syncope -rare 08/31/2013   Past Surgical History:  Procedure Laterality Date   ABDOMINAL HYSTERECTOMY  total   BREAST BIOPSY Left 02/24/2017    malignant   BREAST LUMPECTOMY Left 03/23/2017   broken fingers     CATARACT EXTRACTION Bilateral    COLONOSCOPY     COLONOSCOPY WITH PROPOFOL N/A 06/03/2022   Procedure: COLONOSCOPY WITH PROPOFOL;  Surgeon: Jenel Lucks, MD;  Location: [facility];  Service: Gastroenterology;  Laterality: N/A;   ESOPHAGOGASTRODUODENOSCOPY (EGD) WITH PROPOFOL N/A 05/29/2022   Procedure:  ESOPHAGOGASTRODUODENOSCOPY (EGD) WITH PROPOFOL;  Surgeon: Beverley Fiedler, MD;  Location: [facility];  Service: Gastroenterology;  Laterality: N/A;   EXCISIONAL HEMORRHOIDECTOMY     IR FLUORO GUIDE PORT INSERTION RIGHT  08/04/2017   IR REMOVAL TUN ACCESS W/ PORT W/O FL MOD SED  08/04/2017   IR US GUIDE VASC ACCESS RIGHT  08/04/2017   IRRIGATION AND DEBRIDEMENT ABSCESS Left 05/18/2017   Procedure: IRRIGATION AND DEBRIDEMENT LEFT AXILLARY ABSCESS;  Surgeon: Karie Soda, MD;  Location: [facility];  Service: General;  Laterality: Left;   kidney stones lithotripsy     POLYPECTOMY  06/03/2022   Procedure: POLYPECTOMY;  Surgeon: Jenel Lucks, MD;  Location: [facility];  Service: Gastroenterology;;   Tod [place] PLACEMENT Right 03/23/2017   Procedure: INSERTION PORT-A-CATH;  Surgeon: Harriette Bouillon, MD;  Location: [facility];  Service: General;  Laterality: Right;   RADIOACTIVE SEED GUIDED PARTIAL MASTECTOMY WITH AXILLARY SENTINEL LYMPH NODE BIOPSY Left 03/23/2017   Procedure: LEFT BREAST RADIOACTIVE SEED GUIDED PARTIAL MASTECTOMY AND  SENTINEL LYMPH NODE MAPPING;  Surgeon: Harriette Bouillon, MD;  Location:  SURGERY CENTER;  Service: General;  Laterality: Left;   ROTATOR CUFF REPAIR     left    TONSILLECTOMY     WISDOM TOOTH EXTRACTION      Allergies  Allergen Reactions   Benazepril Anaphylaxis    Possible cause of tongue swelling   Penicillins Anaphylaxis    Has patient had a PCN reaction causing immediate rash, facial/tongue/throat swelling, SOB or lightheadedness with hypotension: yes, Has patient had a PCN reaction causing severe rash involving mucus membranes or skin necrosis:  no, Has patient had a PCN reaction that required hospitalization:  no, Has patient had a PCN reaction occurring within the last 10 years: no, If all of the above answers are "NO", then may proceed with Cephalosporin use.   Advil [Ibuprofen] Itching and Rash    Rash that turns to blisters   Codeine  Hives    hives   Crestor [Rosuvastatin Calcium] Other (See Comments)    Pain in joints   Sulfamethoxazole-Trimethoprim Hives    hives   Bacitracin-Polymyxin B Dermatitis and Rash   Other Rash    Band-aid    Outpatient Encounter Medications as of 10/06/2023  Medication Sig   acetaminophen (TYLENOL) 500 MG tablet Take 500 mg by mouth every 6 (six) hours as needed.   amLODipine (NORVASC) 5 MG tablet Take 5 mg by mouth daily.   aspirin 81 MG chewable tablet Chew 1 tablet (81 mg total) by mouth daily.   atorvastatin (LIPITOR) 80 MG tablet Take 1 tablet (80 mg total) by mouth at bedtime.   cephALEXin (KEFLEX) 250 MG capsule Take 250 mg by mouth daily.   cholecalciferol (VITAMIN D3) 25 MCG (1000 UNIT) tablet Take 1 tablet (1,000 Units total) by mouth daily.   ciclopirox (PENLAC) 8 % solution APPLY TOPICALLY TO AFFECTED NAIL(S) AT BEDTIME. APPLY OVER NAIL & SURROUNDING SKIN. APPLY DAILY OVER PREVIOUS COAT. **AFTER 7 DAYS, MAY REMOVE WITH ALCOHOL AND CONTINUE CYCLE.**   Cranberry  500 MG CHEW Chew 1,000 mg by mouth daily.   docusate sodium (COLACE) 100 MG capsule Take 100 mg by mouth 2 (two) times daily.   estradiol (ESTRACE) 0.1 MG/GM vaginal cream Place 1 Applicatorful vaginally daily. Every Tuesday and Friday   ferrous sulfate 325 (65 FE) MG tablet Take 1 tablet (325 mg total) by mouth daily with breakfast. (Patient taking differently: Take 325 mg by mouth 3 (three) times a week.)   fexofenadine (ALLEGRA) 180 MG tablet Take 180 mg by mouth daily.   hydrochlorothiazide (HYDRODIURIL) 25 MG tablet Take 12.5 mg by mouth daily.   hydrocortisone (ANUSOL-HC) 2.5 % rectal cream Apply 1 Application topically 2 (two) times daily.   L-Lysine 1000 MG TABS Take 1,000 mg by mouth as needed. As needed for fever blister   lactulose (CHRONULAC) 10 GM/15ML solution Take 45 mLs (30 g total) by mouth 2 (two) times daily as needed for mild constipation or moderate constipation.   levothyroxine (SYNTHROID) 75 MCG  tablet Take 1 tablet (75 mcg total) by mouth daily.   losartan (COZAAR) 50 MG tablet Take 50 mg by mouth daily.   MAGNESIUM GLYCINATE PO Take 1 each by mouth daily. gummy   Multiple Vitamins-Minerals (CENTRUM ADULTS MULTIGUMMIES PO) Take 2 each by mouth daily.   nystatin powder Apply 1 Application topically 2 (two) times daily. 60 g to groin folds twice daily   OLANZapine (ZYPREXA) 2.5 MG tablet Take 2.5 mg by mouth daily.   omeprazole (PRILOSEC) 20 MG capsule Take 40 mg by mouth 2 (two) times daily before a meal.   potassium chloride (KLOR-CON M) 10 MEQ tablet Take 10 mEq by mouth once.   venlafaxine XR (EFFEXOR-XR) 75 MG 24 hr capsule Take 3 capsules (225 mg total) by mouth daily.   Zinc Oxide 10 % OINT Apply 1 Application topically See admin instructions. Provide and apply to vaginal area 3 times daily as needed for itching,burning,moisture   No facility-administered encounter medications on file as of 10/06/2023.    Review of Systems  Constitutional:  Negative for activity change and appetite change.  HENT:  Negative for sore throat and trouble swallowing.   Eyes:  Negative for visual disturbance.  Respiratory:  Negative for cough, shortness of breath and wheezing.   Cardiovascular:  Negative for chest pain and leg swelling.  Gastrointestinal:  Negative for abdominal distention and abdominal pain.  Genitourinary:  Negative for dysuria, hematuria and vaginal bleeding.  Musculoskeletal:  Positive for gait problem.  Skin:  Negative for wound.  Neurological:  Positive for weakness. Negative for dizziness and headaches.  Psychiatric/Behavioral:  Positive for dysphoric mood. Negative for confusion and sleep disturbance. The patient is nervous/anxious.     Immunization History  Administered Date(s) Administered   DTaP 07/10/2022   Influenza Split 08/13/2017   Influenza, High Dose Seasonal PF 09/03/2015, 08/05/2016, 08/31/2018, 07/26/2019   Influenza, Quadrivalent, Recombinant, Inj, Pf  08/31/2018, 07/26/2019   Influenza,trivalent, recombinat, inj, PF 08/31/2018   Influenza-Unspecified 08/27/2012, 09/05/2013, 08/26/2016   PFIZER(Purple Top)SARS-COV-2 Vaccination 11/27/2019, 12/18/2019   PNEUMOCOCCAL CONJUGATE-20 02/05/2022   Pneumococcal Conjugate-13 10/05/2013, 07/26/2019   Pneumococcal Polysaccharide-23 03/25/2003   Td 03/25/2003   Tdap 03/25/2003   Zoster Recombinant(Shingrix) 12/27/2018, 07/26/2019   Pertinent  Health Maintenance Due  Topic Date Due   DEXA SCAN  Never done   INFLUENZA VACCINE  08/26/2024 (Originally 06/03/2023)   Colonoscopy  06/03/2025      06/12/2022    8:49 PM 07/07/2022    8:10 PM 10/05/2022  2:09 PM 01/12/2023    2:11 PM 07/23/2023    2:56 PM  Fall Risk  Falls in the past year?   0 0 1  Was there an injury with Fall?   0 0 1  Fall Risk Category Calculator   0 0 3  Fall Risk Category (Retired)   Low    (RETIRED) Patient Fall Risk Level High fall risk Low fall risk     Patient at Risk for Falls Due to     History of fall(s);Impaired balance/gait;Impaired mobility  Fall risk Follow up     Falls evaluation completed;Education provided;Falls prevention discussed   Functional Status Survey:    Vitals:   10/06/23 1535  BP: 110/65  Pulse: 65  Resp: 18  Temp: 98 F (36.7 C)  SpO2: 95%  Weight: 200 lb 6.4 oz (90.9 kg)  Height: 5\' 3"  (1.6 m)   Body mass index is 35.5 kg/m. Physical Exam Vitals reviewed.  Constitutional:      General: She is not in acute distress.    Appearance: She is obese.  HENT:     Head: Normocephalic.     Right Ear: There is no impacted cerumen.     Left Ear: There is no impacted cerumen.     Nose: Nose normal.     Mouth/Throat:     Mouth: Mucous membranes are moist.  Eyes:     General:        Right eye: No discharge.        Left eye: No discharge.     Pupils: Pupils are equal, round, and reactive to light.  Cardiovascular:     Rate and Rhythm: Normal rate and regular rhythm.     Pulses: Normal  pulses.     Heart sounds: Normal heart sounds.  Pulmonary:     Effort: Pulmonary effort is normal.     Breath sounds: Normal breath sounds.  Abdominal:     General: Bowel sounds are normal. There is no distension.     Tenderness: There is no abdominal tenderness.  Musculoskeletal:     Cervical back: Neck supple.     Right lower leg: No edema.     Left lower leg: No edema.  Skin:    General: Skin is warm.     Capillary Refill: Capillary refill takes less than 2 seconds.  Neurological:     General: No focal deficit present.     Mental Status: She is alert and oriented to person, place, and time.     Motor: Weakness present.     Gait: Gait abnormal.  Psychiatric:        Mood and Affect: Mood normal.     Labs reviewed: Recent Labs    04/07/23 0736 05/31/23 1430 06/23/23 1823 06/23/23 2023 06/24/23 1640  NA 136 139 140 138 139  K 3.7 3.2* 3.7 3.6 4.0  CL 97* 99 99  --  97*  CO2 26 30 32  --  28  GLUCOSE 102* 117* 91  --  149*  BUN 10 12 9   --  8  CREATININE 0.72 0.64 0.63  --  0.75  CALCIUM 9.4 9.5 9.7  --  9.5  MG 1.8  --   --   --   --    Recent Labs    04/07/23 0736 05/31/23 1430 06/23/23 1823  AST 22 18 23   ALT 23 23 27   ALKPHOS 54 70 68  BILITOT 0.6 0.3 0.6  PROT 6.2* 6.4*  7.0  ALBUMIN 3.4* 4.0 4.2   Recent Labs    11/27/22 1322 11/27/22 1322 04/07/23 0736 05/31/23 1430 06/23/23 1823 06/23/23 2023 06/24/23 1640 06/25/23 0553  WBC 5.6   < > 6.0 4.8 5.4  --  5.0 4.9  NEUTROABS 3.2  --  4.4 3.1  --   --   --   --   HGB 13.5   < > 12.6 12.3 13.6 12.6 13.1 12.7  HCT 40.5  --  37.5 37.4 40.6 37.0 38.8 39.0  MCV 100.2*  --  101.6* 102.7* 101.2*  --  99.0 101.0*  PLT 245   < > 200 225 234  --  229 224   < > = values in this interval not displayed.   Lab Results  Component Value Date   TSH 0.567 06/23/2023   Lab Results  Component Value Date   HGBA1C 5.6 03/21/2022   Lab Results  Component Value Date   CHOL 297 (H) 03/21/2022   HDL 49  03/21/2022   LDLCALC 216 (H) 03/21/2022   TRIG 161 (H) 03/21/2022   CHOLHDL 6.1 03/21/2022    Significant Diagnostic Results in last 30 days:  No results found.  Assessment/Plan 1. Essential hypertension - controlled, goal < 150/90 - cont amlodipine, losartan and HCTZ  2. OSA (obstructive sleep apnea) - napping more between meals per nursing  - h/o intolerance with CPAP  3. History of CVA (cerebrovascular accident) - h/o left thalamic infarction 2023 - cont aspirin and statin - no recent lipid panel  4. Gastroesophageal reflux disease without esophagitis - hgb stable - cont omerpazole  5. Acquired hypothyroidism - TSH 0.5 - cont levothyroxine  6. Recurrent depression (HCC) - flat affect - no mood changes - attending activities offered - cont Zyprexa and Effexor  7. Anxiety - no recent panic   8. Malignant neoplasm of upper-inner quadrant of left breast in female, estrogen receptor negative (HCC) - followed by oncology - continue schedule port flushes  9. Recurrent UTI - stable with Keflex - encourage hydration  10. Iron deficiency anemia secondary to inadequate dietary iron intake - hgb stable - cont ferrous sulfate  11. Class 2 severe obesity due to excess calories with serious comorbidity and body mass index (BMI) of 35.0 to 35.9 in adult (HCC) - BMI 35.50 - sedentary lifestyle - cont monthly weights    Family/ staff Communication: plan discissed with patient and nurse  Labs/tests ordered:  cbc/diff, cmp, TSH, lipid panel 10/07/2023

## 2023-10-07 DIAGNOSIS — E039 Hypothyroidism, unspecified: Secondary | ICD-10-CM | POA: Diagnosis not present

## 2023-10-07 DIAGNOSIS — D649 Anemia, unspecified: Secondary | ICD-10-CM | POA: Diagnosis not present

## 2023-10-08 DIAGNOSIS — L602 Onychogryphosis: Secondary | ICD-10-CM | POA: Diagnosis not present

## 2023-10-08 DIAGNOSIS — M79671 Pain in right foot: Secondary | ICD-10-CM | POA: Diagnosis not present

## 2023-10-08 DIAGNOSIS — I639 Cerebral infarction, unspecified: Secondary | ICD-10-CM | POA: Diagnosis not present

## 2023-10-08 DIAGNOSIS — R4789 Other speech disturbances: Secondary | ICD-10-CM | POA: Diagnosis not present

## 2023-10-08 DIAGNOSIS — M2011 Hallux valgus (acquired), right foot: Secondary | ICD-10-CM | POA: Diagnosis not present

## 2023-10-08 DIAGNOSIS — M2012 Hallux valgus (acquired), left foot: Secondary | ICD-10-CM | POA: Diagnosis not present

## 2023-10-08 DIAGNOSIS — R498 Other voice and resonance disorders: Secondary | ICD-10-CM | POA: Diagnosis not present

## 2023-10-08 DIAGNOSIS — B351 Tinea unguium: Secondary | ICD-10-CM | POA: Diagnosis not present

## 2023-10-08 DIAGNOSIS — M79672 Pain in left foot: Secondary | ICD-10-CM | POA: Diagnosis not present

## 2023-10-14 DIAGNOSIS — R498 Other voice and resonance disorders: Secondary | ICD-10-CM | POA: Diagnosis not present

## 2023-10-14 DIAGNOSIS — I639 Cerebral infarction, unspecified: Secondary | ICD-10-CM | POA: Diagnosis not present

## 2023-10-14 DIAGNOSIS — R4789 Other speech disturbances: Secondary | ICD-10-CM | POA: Diagnosis not present

## 2023-10-18 DIAGNOSIS — R498 Other voice and resonance disorders: Secondary | ICD-10-CM | POA: Diagnosis not present

## 2023-10-18 DIAGNOSIS — I639 Cerebral infarction, unspecified: Secondary | ICD-10-CM | POA: Diagnosis not present

## 2023-10-18 DIAGNOSIS — R4789 Other speech disturbances: Secondary | ICD-10-CM | POA: Diagnosis not present

## 2023-10-25 DIAGNOSIS — R4789 Other speech disturbances: Secondary | ICD-10-CM | POA: Diagnosis not present

## 2023-10-25 DIAGNOSIS — R498 Other voice and resonance disorders: Secondary | ICD-10-CM | POA: Diagnosis not present

## 2023-10-25 DIAGNOSIS — I639 Cerebral infarction, unspecified: Secondary | ICD-10-CM | POA: Diagnosis not present

## 2023-11-02 DIAGNOSIS — I639 Cerebral infarction, unspecified: Secondary | ICD-10-CM | POA: Diagnosis not present

## 2023-11-02 DIAGNOSIS — R4789 Other speech disturbances: Secondary | ICD-10-CM | POA: Diagnosis not present

## 2023-11-02 DIAGNOSIS — R498 Other voice and resonance disorders: Secondary | ICD-10-CM | POA: Diagnosis not present

## 2023-11-04 DIAGNOSIS — R4789 Other speech disturbances: Secondary | ICD-10-CM | POA: Diagnosis not present

## 2023-11-04 DIAGNOSIS — R498 Other voice and resonance disorders: Secondary | ICD-10-CM | POA: Diagnosis not present

## 2023-11-04 DIAGNOSIS — I639 Cerebral infarction, unspecified: Secondary | ICD-10-CM | POA: Diagnosis not present

## 2023-11-09 DIAGNOSIS — I639 Cerebral infarction, unspecified: Secondary | ICD-10-CM | POA: Diagnosis not present

## 2023-11-09 DIAGNOSIS — R498 Other voice and resonance disorders: Secondary | ICD-10-CM | POA: Diagnosis not present

## 2023-11-09 DIAGNOSIS — R4789 Other speech disturbances: Secondary | ICD-10-CM | POA: Diagnosis not present

## 2023-11-11 DIAGNOSIS — R4789 Other speech disturbances: Secondary | ICD-10-CM | POA: Diagnosis not present

## 2023-11-11 DIAGNOSIS — I639 Cerebral infarction, unspecified: Secondary | ICD-10-CM | POA: Diagnosis not present

## 2023-11-11 DIAGNOSIS — R498 Other voice and resonance disorders: Secondary | ICD-10-CM | POA: Diagnosis not present

## 2023-11-17 DIAGNOSIS — I639 Cerebral infarction, unspecified: Secondary | ICD-10-CM | POA: Diagnosis not present

## 2023-11-17 DIAGNOSIS — R4789 Other speech disturbances: Secondary | ICD-10-CM | POA: Diagnosis not present

## 2023-11-17 DIAGNOSIS — R498 Other voice and resonance disorders: Secondary | ICD-10-CM | POA: Diagnosis not present

## 2023-11-18 DIAGNOSIS — I639 Cerebral infarction, unspecified: Secondary | ICD-10-CM | POA: Diagnosis not present

## 2023-11-18 DIAGNOSIS — R498 Other voice and resonance disorders: Secondary | ICD-10-CM | POA: Diagnosis not present

## 2023-11-18 DIAGNOSIS — R4789 Other speech disturbances: Secondary | ICD-10-CM | POA: Diagnosis not present

## 2023-11-21 ENCOUNTER — Encounter: Payer: Self-pay | Admitting: Internal Medicine

## 2023-11-24 DIAGNOSIS — I639 Cerebral infarction, unspecified: Secondary | ICD-10-CM | POA: Diagnosis not present

## 2023-11-24 DIAGNOSIS — R498 Other voice and resonance disorders: Secondary | ICD-10-CM | POA: Diagnosis not present

## 2023-11-24 DIAGNOSIS — R4789 Other speech disturbances: Secondary | ICD-10-CM | POA: Diagnosis not present

## 2023-11-25 ENCOUNTER — Non-Acute Institutional Stay: Payer: Medicare Other | Admitting: Internal Medicine

## 2023-11-25 ENCOUNTER — Encounter: Payer: Self-pay | Admitting: Internal Medicine

## 2023-11-25 DIAGNOSIS — F339 Major depressive disorder, recurrent, unspecified: Secondary | ICD-10-CM | POA: Diagnosis not present

## 2023-11-25 DIAGNOSIS — I639 Cerebral infarction, unspecified: Secondary | ICD-10-CM | POA: Diagnosis not present

## 2023-11-25 DIAGNOSIS — I10 Essential (primary) hypertension: Secondary | ICD-10-CM | POA: Diagnosis not present

## 2023-11-25 DIAGNOSIS — G4733 Obstructive sleep apnea (adult) (pediatric): Secondary | ICD-10-CM

## 2023-11-25 DIAGNOSIS — R4789 Other speech disturbances: Secondary | ICD-10-CM | POA: Diagnosis not present

## 2023-11-25 DIAGNOSIS — R498 Other voice and resonance disorders: Secondary | ICD-10-CM | POA: Diagnosis not present

## 2023-11-25 NOTE — Progress Notes (Signed)
Location: [facility] Service:  [facility] [phone])  Provider:   Code Status: DNR Goals of Care:     07/29/2023   12:40 PM  Advanced Directives  Does Patient Have a Medical Advance Directive? Yes  Type of Estate agent of [place];Out of facility DNR (pink MOST or yellow form)  Does patient want to make changes to medical advance directive? No - Patient declined  Copy of Healthcare Power of Attorney in Chart? Yes - validated most recent copy scanned in chart (See row information)     Chief Complaint  Patient presents with   Acute Visit    HPI: Patient is a 83 y.o. female seen today for an acute visit for Behaviors and Possible Depression   Patient is a recent admit to [facility]  Has h/o Depression Noticed by staff that she has been sleeping more Flat Effect Rude to family and Staff Attending Less activities  Patient was in her room Did not have any complains Denied being Depressed but does have Flat Effect and answers in short words  Other issues  She was in the hospital from 8/21 to 8/23.  Acute encephalopathy A detailed workup including CBC CMP UA CT of the head MRI of the brain EEG were all negative she was discharged with a baseline mental status   Patient does have a history of hypertension   History of CVA Bilateral Basal Ganglia Infarct Also has history of hyperlipidemia and hypothyroidism   History of triple negative invasive ductal carcinoma of left breast S/p partial mastectomy in 2018 followed by chemo and radiation.  She continues to have a Port-A-Cath which gets flushed every few months by oncology Past Medical History:  Diagnosis Date   Allergy    Anemia    Anxiety    Asthma    in past, no inhalers now   Blood transfusion without reported diagnosis    Breast cancer (HCC) 01/2017   left breast    Breast cancer of upper-inner quadrant of left female breast (HCC) 03/11/2017   Cancer (HCC) 01/2017   left breast   Cataract     bilateral removed   Chronic kidney disease    kidney stones   Depression 08/31/2013   Dyslipidemia, goal LDL below 160 08/31/2013   Essential hypertension - well controlled 08/31/2013   Family history of breast cancer    Family history of melanoma    Family history of prostate cancer    GERD (gastroesophageal reflux disease)    Goals of care, counseling/discussion 03/11/2017   History of radiation therapy 10/13/17-11/11/17   left breast 40.05 Gy in 15 fractions, left breast boost 10 Gy in 5 fractions   Hypothyroidism    Ischemic stroke [facility])    May 2023;   Obesity (BMI 30-39.9) 08/31/2013   Personal history of chemotherapy 2018   Personal history of radiation therapy 2019   Thyroid disease    Vasovagal near-syncope -rare 08/31/2013    Past Surgical History:  Procedure Laterality Date   ABDOMINAL HYSTERECTOMY     total   BREAST BIOPSY Left 02/24/2017    malignant   BREAST LUMPECTOMY Left 03/23/2017   broken fingers     CATARACT EXTRACTION Bilateral    COLONOSCOPY     COLONOSCOPY WITH PROPOFOL N/A 06/03/2022   Procedure: COLONOSCOPY WITH PROPOFOL;  Surgeon: Jenel Lucks, MD;  Location: [facility];  Service: Gastroenterology;  Laterality: N/A;   ESOPHAGOGASTRODUODENOSCOPY (EGD) WITH PROPOFOL N/A 05/29/2022  Procedure: ESOPHAGOGASTRODUODENOSCOPY (EGD) WITH PROPOFOL;  Surgeon: Beverley Fiedler, MD;  Location: [facility];  Service: Gastroenterology;  Laterality: N/A;   EXCISIONAL HEMORRHOIDECTOMY     IR FLUORO GUIDE PORT INSERTION RIGHT  08/04/2017   IR REMOVAL TUN ACCESS W/ PORT W/O FL MOD SED  08/04/2017   IR US GUIDE VASC ACCESS RIGHT  08/04/2017   IRRIGATION AND DEBRIDEMENT ABSCESS Left 05/18/2017   Procedure: IRRIGATION AND DEBRIDEMENT LEFT AXILLARY ABSCESS;  Surgeon: Karie Soda, MD;  Location: [facility];  Service: General;  Laterality: Left;   kidney stones lithotripsy     POLYPECTOMY  06/03/2022   Procedure: POLYPECTOMY;  Surgeon: Jenel Lucks, MD;  Location: [facility];  Service: Gastroenterology;;   Tod [place] PLACEMENT Right 03/23/2017   Procedure: INSERTION PORT-A-CATH;  Surgeon: Harriette Bouillon, MD;  Location: [facility];  Service: General;  Laterality: Right;   RADIOACTIVE SEED GUIDED PARTIAL MASTECTOMY WITH AXILLARY SENTINEL LYMPH NODE BIOPSY Left 03/23/2017   Procedure: LEFT BREAST RADIOACTIVE SEED GUIDED PARTIAL MASTECTOMY AND  SENTINEL LYMPH NODE MAPPING;  Surgeon: Harriette Bouillon, MD;  Location: [facility];  Service: General;  Laterality: Left;   ROTATOR CUFF REPAIR     left    TONSILLECTOMY     WISDOM TOOTH EXTRACTION      Allergies  Allergen Reactions   Benazepril Anaphylaxis    Possible cause of tongue swelling   Penicillins Anaphylaxis    Has patient had a PCN reaction causing immediate rash, facial/tongue/throat swelling, SOB or lightheadedness with hypotension: yes, Has patient had a PCN reaction causing severe rash involving mucus membranes or skin necrosis:  no, Has patient had a PCN reaction that required hospitalization:  no, Has patient had a PCN reaction occurring within the last 10 years: no, If all of the above answers are "NO", then may proceed with Cephalosporin use.   Advil [Ibuprofen] Itching and Rash    Rash that turns to blisters   Codeine Hives    hives   Crestor [Rosuvastatin Calcium] Other (See Comments)    Pain in joints   Sulfamethoxazole-Trimethoprim Hives    hives   Bacitracin-Polymyxin B Dermatitis and Rash   Other Rash    Band-aid    Outpatient Encounter Medications as of 11/25/2023  Medication Sig   acetaminophen (TYLENOL) 500 MG tablet Take 500 mg by mouth every 6 (six) hours as needed.   amLODipine (NORVASC) 5 MG tablet Take 5 mg by mouth daily.   aspirin 81 MG chewable tablet Chew 1 tablet (81 mg total) by mouth daily.   atorvastatin (LIPITOR) 80 MG tablet Take 1 tablet (80 mg total) by mouth at bedtime.   cephALEXin (KEFLEX) 250 MG capsule Take 250 mg by mouth daily.    cholecalciferol (VITAMIN D3) 25 MCG (1000 UNIT) tablet Take 1 tablet (1,000 Units total) by mouth daily.   ciclopirox (PENLAC) 8 % solution APPLY TOPICALLY TO AFFECTED NAIL(S) AT BEDTIME. APPLY OVER NAIL & SURROUNDING SKIN. APPLY DAILY OVER PREVIOUS COAT. **AFTER 7 DAYS, MAY REMOVE WITH ALCOHOL AND CONTINUE CYCLE.**   Cranberry 500 MG CHEW Chew 1,000 mg by mouth daily.   docusate sodium (COLACE) 100 MG capsule Take 100 mg by mouth 2 (two) times daily.   estradiol (ESTRACE) 0.1 MG/GM vaginal cream Place 1 Applicatorful vaginally daily. Every Tuesday and Friday   ferrous sulfate 325 (65 FE) MG tablet Take 1 tablet (325 mg total) by mouth daily with breakfast. (Patient taking differently: Take 325 mg by mouth 3 (three)  times a week.)   fexofenadine (ALLEGRA) 180 MG tablet Take 180 mg by mouth daily.   hydrochlorothiazide (HYDRODIURIL) 25 MG tablet Take 12.5 mg by mouth daily.   hydrocortisone (ANUSOL-HC) 2.5 % rectal cream Apply 1 Application topically 2 (two) times daily.   L-Lysine 1000 MG TABS Take 1,000 mg by mouth as needed. As needed for fever blister   lactulose (CHRONULAC) 10 GM/15ML solution Take 45 mLs (30 g total) by mouth 2 (two) times daily as needed for mild constipation or moderate constipation.   levothyroxine (SYNTHROID) 75 MCG tablet Take 1 tablet (75 mcg total) by mouth daily.   losartan (COZAAR) 50 MG tablet Take 50 mg by mouth daily.   MAGNESIUM GLYCINATE PO Take 1 each by mouth daily. gummy   Multiple Vitamins-Minerals (CENTRUM ADULTS MULTIGUMMIES PO) Take 2 each by mouth daily.   nystatin powder Apply 1 Application topically 2 (two) times daily. 60 g to groin folds twice daily   OLANZapine (ZYPREXA) 2.5 MG tablet Take 2.5 mg by mouth daily.   omeprazole (PRILOSEC) 20 MG capsule Take 40 mg by mouth 2 (two) times daily before a meal.   potassium chloride (KLOR-CON M) 10 MEQ tablet Take 10 mEq by mouth once.   venlafaxine XR (EFFEXOR-XR) 75 MG 24 hr capsule Take 3 capsules (225  mg total) by mouth daily.   Zinc Oxide 10 % OINT Apply 1 Application topically See admin instructions. Provide and apply to vaginal area 3 times daily as needed for itching,burning,moisture   No facility-administered encounter medications on file as of 11/25/2023.    Review of Systems:  Review of Systems  Constitutional:  Negative for activity change and appetite change.  HENT: Negative.    Respiratory:  Negative for cough and shortness of breath.   Cardiovascular:  Negative for leg swelling.  Gastrointestinal:  Negative for constipation.  Genitourinary: Negative.   Musculoskeletal:  Positive for gait problem. Negative for arthralgias and myalgias.  Skin: Negative.   Neurological:  Negative for dizziness and weakness.  Psychiatric/Behavioral:  Positive for dysphoric mood. Negative for confusion and sleep disturbance.     Health Maintenance  Topic Date Due   DEXA SCAN  Never done   COVID-19 Vaccine (3 - Pfizer risk series) 01/15/2020   INFLUENZA VACCINE  08/26/2024 (Originally 06/03/2023)   Medicare Annual Wellness (AWV)  07/22/2024   Colonoscopy  06/03/2025   DTaP/Tdap/Td (4 - Td or Tdap) 07/10/2032   Pneumonia Vaccine 61+ Years old  Completed   Zoster Vaccines- Shingrix  Completed   HPV VACCINES  Aged Out    Physical Exam: Vitals:   11/25/23 1343  BP: 120/75  Pulse: 65  Resp: 17  Temp: (!) 97.2 F (36.2 C)  Weight: 200 lb (90.7 kg)   Body mass index is 35.43 kg/m. Physical Exam Vitals reviewed.  Constitutional:      Appearance: Normal appearance.  HENT:     Head: Normocephalic.     Nose: Nose normal.     Mouth/Throat:     Mouth: Mucous membranes are moist.     Pharynx: Oropharynx is clear.  Eyes:     Pupils: Pupils are equal, round, and reactive to light.  Cardiovascular:     Rate and Rhythm: Normal rate and regular rhythm.     Pulses: Normal pulses.     Heart sounds: Normal heart sounds. No murmur heard. Pulmonary:     Effort: Pulmonary effort is normal.      Breath sounds: Normal breath sounds.  Abdominal:  General: Abdomen is flat. Bowel sounds are normal.     Palpations: Abdomen is soft.  Musculoskeletal:        General: No swelling.     Cervical back: Neck supple.  Skin:    General: Skin is warm.  Neurological:     General: No focal deficit present.     Mental Status: She is alert and oriented to person, place, and time.  Psychiatric:        Mood and Affect: Mood normal.        Thought Content: Thought content normal.     Labs reviewed: Basic Metabolic Panel: Recent Labs    04/07/23 0736 05/31/23 1430 06/23/23 1823 06/23/23 2012 06/23/23 2023 06/24/23 1640  NA 136 139 140  --  138 139  K 3.7 3.2* 3.7  --  3.6 4.0  CL 97* 99 99  --   --  97*  CO2 26 30 32  --   --  28  GLUCOSE 102* 117* 91  --   --  149*  BUN 10 12 9   --   --  8  CREATININE 0.72 0.64 0.63  --   --  0.75  CALCIUM 9.4 9.5 9.7  --   --  9.5  MG 1.8  --   --   --   --   --   TSH  --   --   --  0.567  --   --    Liver Function Tests: Recent Labs    04/07/23 0736 05/31/23 1430 06/23/23 1823  AST 22 18 23   ALT 23 23 27   ALKPHOS 54 70 68  BILITOT 0.6 0.3 0.6  PROT 6.2* 6.4* 7.0  ALBUMIN 3.4* 4.0 4.2   No results for input(s): "LIPASE", "AMYLASE" in the last 8760 hours. Recent Labs    06/23/23 2012  AMMONIA 21   CBC: Recent Labs    11/27/22 1322 11/27/22 1322 04/07/23 0736 05/31/23 1430 06/23/23 1823 06/23/23 2023 06/24/23 1640 06/25/23 0553  WBC 5.6   < > 6.0 4.8 5.4  --  5.0 4.9  NEUTROABS 3.2  --  4.4 3.1  --   --   --   --   HGB 13.5   < > 12.6 12.3 13.6 12.6 13.1 12.7  HCT 40.5  --  37.5 37.4 40.6 37.0 38.8 39.0  MCV 100.2*  --  101.6* 102.7* 101.2*  --  99.0 101.0*  PLT 245   < > 200 225 234  --  229 224   < > = values in this interval not displayed.   Lipid Panel: No results for input(s): "CHOL", "HDL", "LDLCALC", "TRIG", "CHOLHDL", "LDLDIRECT" in the last 8760 hours. Lab Results  Component Value Date   HGBA1C 5.6  03/21/2022    Procedures since last visit: No results found.  Assessment/Plan 1. Recurrent depression (HCC) (Primary) On High Doses of Effexor Has been on it for many years and Zyprexa Will Discontinue Zyprexa  Start her on Wellbutrin XL 150 MG QD   2. OSA (obstructive sleep apnea) Has a diagnosis but does not wear CPAP Can explain her Daytime Sleepiness  3. Essential hypertension Controlled BMP was normal in 11/24    Labs/tests ordered:  * No order type specified * Next appt:  Visit date not found

## 2023-12-01 ENCOUNTER — Inpatient Hospital Stay: Payer: Medicare Other

## 2023-12-01 ENCOUNTER — Inpatient Hospital Stay (HOSPITAL_BASED_OUTPATIENT_CLINIC_OR_DEPARTMENT_OTHER): Payer: Medicare Other | Admitting: Hematology & Oncology

## 2023-12-01 ENCOUNTER — Inpatient Hospital Stay: Payer: Medicare Other | Attending: Hematology & Oncology

## 2023-12-01 ENCOUNTER — Encounter: Payer: Self-pay | Admitting: Hematology & Oncology

## 2023-12-01 VITALS — BP 132/45 | HR 71 | Temp 98.5°F | Resp 17 | Ht 63.0 in | Wt 199.0 lb

## 2023-12-01 DIAGNOSIS — Z170 Estrogen receptor positive status [ER+]: Secondary | ICD-10-CM

## 2023-12-01 DIAGNOSIS — Z9221 Personal history of antineoplastic chemotherapy: Secondary | ICD-10-CM | POA: Diagnosis not present

## 2023-12-01 DIAGNOSIS — K644 Residual hemorrhoidal skin tags: Secondary | ICD-10-CM | POA: Insufficient documentation

## 2023-12-01 DIAGNOSIS — Z171 Estrogen receptor negative status [ER-]: Secondary | ICD-10-CM | POA: Diagnosis not present

## 2023-12-01 DIAGNOSIS — Z853 Personal history of malignant neoplasm of breast: Secondary | ICD-10-CM | POA: Insufficient documentation

## 2023-12-01 DIAGNOSIS — C50212 Malignant neoplasm of upper-inner quadrant of left female breast: Secondary | ICD-10-CM | POA: Diagnosis not present

## 2023-12-01 DIAGNOSIS — F0390 Unspecified dementia without behavioral disturbance: Secondary | ICD-10-CM | POA: Insufficient documentation

## 2023-12-01 DIAGNOSIS — Z923 Personal history of irradiation: Secondary | ICD-10-CM | POA: Insufficient documentation

## 2023-12-01 DIAGNOSIS — Z9889 Other specified postprocedural states: Secondary | ICD-10-CM | POA: Insufficient documentation

## 2023-12-01 LAB — CMP (CANCER CENTER ONLY)
ALT: 22 U/L (ref 0–44)
AST: 25 U/L (ref 15–41)
Albumin: 4.2 g/dL (ref 3.5–5.0)
Alkaline Phosphatase: 66 U/L (ref 38–126)
Anion gap: 8 (ref 5–15)
BUN: 11 mg/dL (ref 8–23)
CO2: 30 mmol/L (ref 22–32)
Calcium: 9.5 mg/dL (ref 8.9–10.3)
Chloride: 102 mmol/L (ref 98–111)
Creatinine: 0.63 mg/dL (ref 0.44–1.00)
GFR, Estimated: >60 mL/min (ref >60)
Glucose, Bld: 97 mg/dL (ref 70–99)
Potassium: 3.4 mmol/L — ABNORMAL LOW (ref 3.5–5.1)
Sodium: 140 mmol/L (ref 135–145)
Total Bilirubin: 0.4 mg/dL (ref 0.0–1.2)
Total Protein: 6.3 g/dL — ABNORMAL LOW (ref 6.5–8.1)

## 2023-12-01 LAB — CBC WITH DIFFERENTIAL (CANCER CENTER ONLY)
Abs Immature Granulocytes: 0.01 10*3/uL (ref 0.00–0.07)
Basophils Absolute: 0 10*3/uL (ref 0.0–0.1)
Basophils Relative: 0 %
Eosinophils Absolute: 0.2 10*3/uL (ref 0.0–0.5)
Eosinophils Relative: 3 %
HCT: 35.4 % — ABNORMAL LOW (ref 36.0–46.0)
Hemoglobin: 11.6 g/dL — ABNORMAL LOW (ref 12.0–15.0)
Immature Granulocytes: 0 %
Lymphocytes Relative: 21 %
Lymphs Abs: 1.3 10*3/uL (ref 0.7–4.0)
MCH: 33 pg (ref 26.0–34.0)
MCHC: 32.8 g/dL (ref 30.0–36.0)
MCV: 100.6 fL — ABNORMAL HIGH (ref 80.0–100.0)
Monocytes Absolute: 0.5 10*3/uL (ref 0.1–1.0)
Monocytes Relative: 8 %
Neutro Abs: 4.2 10*3/uL (ref 1.7–7.7)
Neutrophils Relative %: 68 %
Platelet Count: 266 10*3/uL (ref 150–400)
RBC: 3.52 MIL/uL — ABNORMAL LOW (ref 3.87–5.11)
RDW: 13.2 % (ref 11.5–15.5)
WBC Count: 6.1 10*3/uL (ref 4.0–10.5)
nRBC: 0 % (ref 0.0–0.2)

## 2023-12-01 LAB — FERRITIN: Ferritin: 39 ng/mL (ref 11–307)

## 2023-12-01 MED ORDER — SODIUM CHLORIDE 0.9% FLUSH
10.0000 mL | INTRAVENOUS | Status: DC | PRN
  Administered 2023-12-01: 10 mL via INTRAVENOUS

## 2023-12-01 MED ORDER — HEPARIN SOD (PORK) LOCK FLUSH 100 UNIT/ML IV SOLN
500.0000 [IU] | Freq: Once | INTRAVENOUS | Status: AC
  Administered 2023-12-01: 500 [IU] via INTRAVENOUS

## 2023-12-01 NOTE — Progress Notes (Signed)
Hematology and Oncology Follow Up Visit  Jessica [name] [id number] Mar 11, 1941 83 y.o. 12/01/2023   Principle Diagnosis:  Stage IIA (T2bN0M0) invasive ductal ca of the LEFT breast - TRIPLE NEGATIVE Staphylococcus aureus wound infection of the LEFT axilla Cellulitis of the left breast  Past Therapy:   Taxotere/Carboplatin - s/p cycle #4 -completed adjuvant therapy on 09/09/2017 Radiation therapy to the right breast.-Completed 4005 rad with an additional 1000 rad boost on 11/11/2017  Current Therapy: Observation   Interim History:  Jessica [name] is here today for follow-up.  Jessica [name] now is living at [facility].  Jessica [name] enjoys it there.  Her dementia is certainly becoming more of an issue.  Jessica [name] really does not talk all that much.  Her answers are somewhat on the short side.  Jessica [name] seems to be eating okay.  Jessica [name] is having no problems with pain.  There is no obvious change in bowel or bladder habits.  Jessica [name] has had no rashes.  Jessica [name] has had no bleeding.  Thankfully, Jessica [name] has had no problems with COVID.  There is been no nausea or vomiting.  Overall, I would have said that her performance status is probably ECOG 3.    Medications:  Allergies as of 12/01/2023       Reactions   Benazepril Anaphylaxis   Possible cause of tongue swelling   Penicillins Anaphylaxis   Has patient had a PCN reaction causing immediate rash, facial/tongue/throat swelling, SOB or lightheadedness with hypotension: yes, Has patient had a PCN reaction causing severe rash involving mucus membranes or skin necrosis:  no, Has patient had a PCN reaction that required hospitalization:  no, Has patient had a PCN reaction occurring within the last 10 years: no, If all of the above answers are "NO", then may proceed with Cephalosporin use.   Advil [ibuprofen] Itching, Rash   Rash that turns to blisters   Codeine Hives   hives   Crestor [rosuvastatin Calcium] Other (See Comments)   Pain in joints   Sulfamethoxazole-trimethoprim  Hives   hives   Bacitracin-polymyxin B Dermatitis, Rash   Other Rash   Band-aid        Medication List        Accurate as of December 01, 2023  3:25 PM. If you have any questions, ask your nurse or doctor.          acetaminophen 500 MG tablet Commonly known as: TYLENOL Take 500 mg by mouth every 6 (six) hours as needed.   amLODipine 5 MG tablet Commonly known as: NORVASC Take 5 mg by mouth daily.   aspirin 81 MG chewable tablet Chew 1 tablet (81 mg total) by mouth daily.   atorvastatin 80 MG tablet Commonly known as: LIPITOR Take 1 tablet (80 mg total) by mouth at bedtime.   buPROPion 150 MG 24 hr tablet Commonly known as: WELLBUTRIN XL Take 150 mg by mouth daily.   CENTRUM ADULTS MULTIGUMMIES PO Take 2 each by mouth daily.   cephALEXin 250 MG capsule Commonly known as: KEFLEX Take 250 mg by mouth daily.   cholecalciferol 25 MCG (1000 UNIT) tablet Commonly known as: VITAMIN D3 Take 1 tablet (1,000 Units total) by mouth daily.   ciclopirox 8 % solution Commonly known as: PENLAC APPLY TOPICALLY TO AFFECTED NAIL(S) AT BEDTIME. APPLY OVER NAIL & SURROUNDING SKIN. APPLY DAILY OVER PREVIOUS COAT. **AFTER 7 DAYS, MAY REMOVE WITH ALCOHOL AND CONTINUE CYCLE.**   Cranberry 500 MG Chew Chew 1,000 mg by mouth daily.   docusate sodium  100 MG capsule Commonly known as: COLACE Take 100 mg by mouth 2 (two) times daily.   estradiol 0.1 MG/GM vaginal cream Commonly known as: ESTRACE Place 1 Applicatorful vaginally daily. Every Tuesday and Friday   ferrous sulfate 325 (65 FE) MG tablet Take 1 tablet (325 mg total) by mouth daily with breakfast. What changed: when to take this   fexofenadine 180 MG tablet Commonly known as: ALLEGRA Take 180 mg by mouth daily.   hydrochlorothiazide 25 MG tablet Commonly known as: HYDRODIURIL Take 12.5 mg by mouth daily.   hydrocortisone 2.5 % rectal cream Commonly known as: ANUSOL-HC Apply 1 Application topically 2 (two) times  daily.   L-Lysine 1000 MG Tabs Take 1,000 mg by mouth as needed. As needed for fever blister   lactulose 10 GM/15ML solution Commonly known as: CHRONULAC Take 45 mLs (30 g total) by mouth 2 (two) times daily as needed for mild constipation or moderate constipation.   levothyroxine 75 MCG tablet Commonly known as: SYNTHROID Take 1 tablet (75 mcg total) by mouth daily.   losartan 50 MG tablet Commonly known as: COZAAR Take 50 mg by mouth daily.   MAGNESIUM GLYCINATE PO Take 1 each by mouth daily. gummy   nystatin powder Apply 1 Application topically 2 (two) times daily. 60 g to groin folds twice daily   OLANZapine 2.5 MG tablet Commonly known as: ZYPREXA Take 2.5 mg by mouth daily.   omeprazole 20 MG capsule Commonly known as: PRILOSEC Take 40 mg by mouth 2 (two) times daily before a meal.   potassium chloride 10 MEQ tablet Commonly known as: KLOR-CON M Take 10 mEq by mouth once.   venlafaxine XR 75 MG 24 hr capsule Commonly known as: EFFEXOR-XR Take 3 capsules (225 mg total) by mouth daily.   Zinc Oxide 10 % Oint Apply 1 Application topically See admin instructions. Provide and apply to vaginal area 3 times daily as needed for itching,burning,moisture        Allergies:  Allergies  Allergen Reactions   Benazepril Anaphylaxis    Possible cause of tongue swelling   Penicillins Anaphylaxis    Has patient had a PCN reaction causing immediate rash, facial/tongue/throat swelling, SOB or lightheadedness with hypotension: yes, Has patient had a PCN reaction causing severe rash involving mucus membranes or skin necrosis:  no, Has patient had a PCN reaction that required hospitalization:  no, Has patient had a PCN reaction occurring within the last 10 years: no, If all of the above answers are "NO", then may proceed with Cephalosporin use.   Advil [Ibuprofen] Itching and Rash    Rash that turns to blisters   Codeine Hives    hives   Crestor [Rosuvastatin Calcium] Other  (See Comments)    Pain in joints   Sulfamethoxazole-Trimethoprim Hives    hives   Bacitracin-Polymyxin B Dermatitis and Rash   Other Rash    Band-aid    Past Medical History, Surgical history, Social history, and Family History were reviewed and updated.  Review of Systems: Review of Systems  Constitutional: Negative.   HENT: Negative.    Eyes: Negative.   Respiratory: Negative.    Cardiovascular: Negative.   Gastrointestinal: Negative.   Genitourinary: Negative.   Musculoskeletal: Negative.   Skin: Negative.   Neurological: Negative.   Endo/Heme/Allergies: Negative.   Psychiatric/Behavioral: Negative.       Physical Exam:  height is 5\' 3"  (1.6 m) and weight is 199 lb (90.3 kg). Her oral temperature is 98.5 F (36.9  C). Her blood pressure is 132/45 (abnormal) and her pulse is 71. Her respiration is 17 and oxygen saturation is 92%.   .  Physical Exam Vitals reviewed.  Constitutional:      Comments: Her breast exam shows right breast with no masses, edema or erythema.  There is no right axillary adenopathy.  Left breast shows the lumpectomy that is well-healed.  Jessica [name] has some contraction of the left breast.  There are some firmness at the lumpectomy site secondary to prior infection that required secondary intention.  There is no distinct mass in the left breast.  There is no left axillary adenopathy.  There is some telangiectasias on the left breast from radiation.  HENT:     Head: Normocephalic and atraumatic.  Eyes:     Pupils: Pupils are equal, round, and reactive to light.  Cardiovascular:     Rate and Rhythm: Normal rate and regular rhythm.     Heart sounds: Normal heart sounds.  Pulmonary:     Effort: Pulmonary effort is normal.     Breath sounds: Normal breath sounds.  Abdominal:     General: Bowel sounds are normal.     Palpations: Abdomen is soft.  Genitourinary:    Comments: Rectal exam shows some small external hemorrhoids.  Jessica [name] had no mass in the rectal  vault.  Her stool was dark brown and heme positive. Musculoskeletal:        General: No tenderness or deformity. Normal range of motion.     Cervical back: Normal range of motion.  Lymphadenopathy:     Cervical: No cervical adenopathy.  Skin:    General: Skin is warm and dry.     Findings: No erythema or rash.  Neurological:     Mental Status: Jessica [name] is alert and oriented to person, place, and time.     Comments: Neurologically, there is no focal deficits.  Jessica [name] has a very flat affect.  Jessica [name] has a sort of monotone type voice.  Psychiatric:        Behavior: Behavior normal.        Thought Content: Thought content normal.        Judgment: Judgment normal.      Lab Results  Component Value Date   WBC 6.1 12/01/2023   HGB 11.6 (L) 12/01/2023   HCT 35.4 (L) 12/01/2023   MCV 100.6 (H) 12/01/2023   PLT 266 12/01/2023   Lab Results  Component Value Date   FERRITIN 7 (L) 05/28/2022   IRON 18 (L) 05/28/2022   TIBC 441 05/28/2022   UIBC 423 05/28/2022   IRONPCTSAT 4 (L) 05/28/2022   Lab Results  Component Value Date   RETICCTPCT 2.1 05/29/2022   RBC 3.52 (L) 12/01/2023   No results found for: "KPAFRELGTCHN", "LAMBDASER", "KAPLAMBRATIO" No results found for: "IGGSERUM", "IGA", "IGMSERUM" No results found for: "TOTALPROTELP", "ALBUMINELP", "A1GS", "A2GS", "BETS", "BETA2SER", "GAMS", "MSPIKE", "SPEI"   Chemistry      Component Value Date/Time   NA 140 12/01/2023 1420   NA 139 04/13/2022 1332   NA 143 10/08/2017 1042   NA 141 06/16/2017 1014   K 3.4 (L) 12/01/2023 1420   K 3.4 10/08/2017 1042   K 3.8 06/16/2017 1014   CL 102 12/01/2023 1420   CL 100 10/08/2017 1042   CO2 30 12/01/2023 1420   CO2 30 10/08/2017 1042   CO2 31 (H) 06/16/2017 1014   BUN 11 12/01/2023 1420   BUN 14 04/13/2022 1332   BUN 10 10/08/2017 1042  BUN 8.9 06/16/2017 1014   CREATININE 0.63 12/01/2023 1420   CREATININE 0.9 10/08/2017 1042   CREATININE 0.8 06/16/2017 1014      Component Value Date/Time    CALCIUM 9.5 12/01/2023 1420   CALCIUM 9.5 10/08/2017 1042   CALCIUM 10.1 06/16/2017 1014   ALKPHOS 66 12/01/2023 1420   ALKPHOS 68 10/08/2017 1042   ALKPHOS 64 06/16/2017 1014   AST 25 12/01/2023 1420   AST 21 06/16/2017 1014   ALT 22 12/01/2023 1420   ALT 20 10/08/2017 1042   ALT 20 06/16/2017 1014   BILITOT 0.4 12/01/2023 1420   BILITOT 0.37 06/16/2017 1014      Impression and Plan: Jessica [name] is a very pleasant 83 yo caucasian female with stage IIA ductal carcinoma of the left breast.  Her breast cancer is TRIPLE NEGATIVE.    Jessica [name] had her partial mastectomy in May 2018 followed by chemotherapy and radiation.  Jessica [name] completed chemotherapy in November 2018.  Jessica [name] then had radiation and completed radiation in January 2019.  I am glad that Jessica [name] is at [facility].  I know they do have activities for their residents.  I just hate that Jessica [name] has this dementia.  I know that Jessica [name] is try to manage.  I know her family is doing okay.  I am glad that Jessica [name] got through the Holiday season.  We will plan to get her back in 6 months.  Jessica [name] still has her Port-A-Cath and that we have to make sure we get flushed.    Josph Macho, MD 1/29/20253:25 PM

## 2023-12-02 ENCOUNTER — Encounter: Payer: Self-pay | Admitting: *Deleted

## 2023-12-02 LAB — IRON AND IRON BINDING CAPACITY (CC-WL,HP ONLY)
Iron: 99 ug/dL (ref 28–170)
Saturation Ratios: 29 % (ref 10.4–31.8)
TIBC: 337 ug/dL (ref 250–450)
UIBC: 238 ug/dL (ref 148–442)

## 2023-12-14 ENCOUNTER — Non-Acute Institutional Stay: Payer: Medicare Other | Admitting: Adult Health

## 2023-12-14 ENCOUNTER — Encounter: Payer: Self-pay | Admitting: Adult Health

## 2023-12-14 DIAGNOSIS — D508 Other iron deficiency anemias: Secondary | ICD-10-CM

## 2023-12-14 DIAGNOSIS — I10 Essential (primary) hypertension: Secondary | ICD-10-CM | POA: Diagnosis not present

## 2023-12-14 DIAGNOSIS — E039 Hypothyroidism, unspecified: Secondary | ICD-10-CM

## 2023-12-14 DIAGNOSIS — K219 Gastro-esophageal reflux disease without esophagitis: Secondary | ICD-10-CM

## 2023-12-14 DIAGNOSIS — G253 Myoclonus: Secondary | ICD-10-CM

## 2023-12-14 MED ORDER — FERROUS SULFATE 325 (65 FE) MG PO TABS
325.0000 mg | ORAL_TABLET | ORAL | Status: AC
Start: 2023-12-15 — End: ?

## 2023-12-14 MED ORDER — GABAPENTIN 100 MG PO CAPS
100.0000 mg | ORAL_CAPSULE | Freq: Every day | ORAL | 3 refills | Status: DC
Start: 2023-12-14 — End: 2024-04-07

## 2023-12-14 NOTE — Progress Notes (Signed)
Location:  [facility] Room Number: 18 A Place of Service:  ALF [phone]) Provider:  Kenard Gower, DNP, FNP-BC  Patient Care Team: Mahlon Gammon, MD as PCP - General (Internal Medicine)  Extended Emergency Contact Information Primary Emergency Contact: Angelique Blonder, [place] Home Phone: [phone] Mobile Phone: [phone] Relation: Daughter Secondary Emergency Contact: Chrystie Nose, [place] Home Phone: [phone] Mobile Phone: [phone] Relation: Daughter  Code Status:   DNR  Goals of care: Advanced Directive information    12/01/2023    3:02 PM  Advanced Directives  Does Patient Have a Medical Advance Directive? Yes  Type of Estate agent of [place];Living will  Does patient want to make changes to medical advance directive? No - Patient declined  Copy of Healthcare Power of Attorney in Chart? No - copy requested  Would patient like information on creating a medical advance directive? No - Patient declined     Chief Complaint  Patient presents with   Acute Visit    Jerking of arms    HPI:  Pt is a 83 y.o. female seen today for an acute visit regarding jerking of her arms. She is a resident of [facility] [address] [facility]. She reported having her arms jerking when she is sleeping which makes it difficult to sleep. She had an episode today of her arms jerking while holding a cup of water so the water spilled on her pants.  BP 128/64, takes hydrochlorothiazide 12.5 mg daily, Amlodipine 5 mg daily and Losartan 50 mg daily. She denies headache nor dizziness  She takes Levothyroxine 75 mcg daily for hypothyroidism  She denies heartburns, takes Omeprazole 40 mg BID for GERD   Past Medical History:  Diagnosis Date   Allergy    Anemia    Anxiety    Asthma    in past, no inhalers now   Blood transfusion without reported diagnosis     Breast cancer (HCC) 01/2017   left breast    Breast cancer of upper-inner quadrant of left female breast (HCC) 03/11/2017   Cancer (HCC) 01/2017   left breast   Cataract    bilateral removed   Chronic kidney disease    kidney stones   Depression 08/31/2013   Dyslipidemia, goal LDL below 160 08/31/2013   Essential hypertension - well controlled 08/31/2013   Family history of breast cancer    Family history of melanoma    Family history of prostate cancer    GERD (gastroesophageal reflux disease)    Goals of care, counseling/discussion 03/11/2017   History of radiation therapy 10/13/17-11/11/17   left breast 40.05 Gy in 15 fractions, left breast boost 10 Gy in 5 fractions   Hypothyroidism    Ischemic stroke [facility])    May 2023;   Obesity (BMI 30-39.9) 08/31/2013   Personal history of chemotherapy 2018   Personal history of radiation therapy 2019   Thyroid disease    Vasovagal near-syncope -rare 08/31/2013   Past Surgical History:  Procedure Laterality Date   ABDOMINAL HYSTERECTOMY     total   BREAST BIOPSY Left 02/24/2017    malignant   BREAST LUMPECTOMY Left 03/23/2017   broken fingers     CATARACT EXTRACTION Bilateral    COLONOSCOPY     COLONOSCOPY WITH PROPOFOL N/A 06/03/2022   Procedure: COLONOSCOPY WITH PROPOFOL;  Surgeon: Tomasa Rand,  Dub Amis, MD;  Location: [facility];  Service: Gastroenterology;  Laterality: N/A;   ESOPHAGOGASTRODUODENOSCOPY (EGD) WITH PROPOFOL N/A 05/29/2022   Procedure: ESOPHAGOGASTRODUODENOSCOPY (EGD) WITH PROPOFOL;  Surgeon: Beverley Fiedler, MD;  Location: [facility];  Service: Gastroenterology;  Laterality: N/A;   EXCISIONAL HEMORRHOIDECTOMY     IR FLUORO GUIDE PORT INSERTION RIGHT  08/04/2017   IR REMOVAL TUN ACCESS W/ PORT W/O FL MOD SED  08/04/2017   IR US GUIDE VASC ACCESS RIGHT  08/04/2017   IRRIGATION AND DEBRIDEMENT ABSCESS Left 05/18/2017   Procedure: IRRIGATION AND DEBRIDEMENT LEFT AXILLARY ABSCESS;  Surgeon: Karie Soda, MD;  Location: [facility];  Service: General;  Laterality: Left;   kidney stones lithotripsy     POLYPECTOMY  06/03/2022   Procedure: POLYPECTOMY;  Surgeon: Jenel Lucks, MD;  Location: [facility];  Service: Gastroenterology;;   Tod [place] PLACEMENT Right 03/23/2017   Procedure: INSERTION PORT-A-CATH;  Surgeon: Harriette Bouillon, MD;  Location: [facility];  Service: General;  Laterality: Right;   RADIOACTIVE SEED GUIDED PARTIAL MASTECTOMY WITH AXILLARY SENTINEL LYMPH NODE BIOPSY Left 03/23/2017   Procedure: LEFT BREAST RADIOACTIVE SEED GUIDED PARTIAL MASTECTOMY AND  SENTINEL LYMPH NODE MAPPING;  Surgeon: Harriette Bouillon, MD;  Location: [facility];  Service: General;  Laterality: Left;   ROTATOR CUFF REPAIR     left    TONSILLECTOMY     WISDOM TOOTH EXTRACTION      Allergies  Allergen Reactions   Benazepril Anaphylaxis    Possible cause of tongue swelling   Penicillins Anaphylaxis    Has patient had a PCN reaction causing immediate rash, facial/tongue/throat swelling, SOB or lightheadedness with hypotension: yes, Has patient had a PCN reaction causing severe rash involving mucus membranes or skin necrosis:  no, Has patient had a PCN reaction that required hospitalization:  no, Has patient had a PCN reaction occurring within the last 10 years: no, If all of the above answers are "NO", then may proceed with Cephalosporin use.   Advil [Ibuprofen] Itching and Rash    Rash that turns to blisters   Codeine Hives    hives   Crestor [Rosuvastatin Calcium] Other (See Comments)    Pain in joints   Sulfamethoxazole-Trimethoprim Hives    hives   Bacitracin-Polymyxin B Dermatitis and Rash   Other Rash    Band-aid    Outpatient Encounter Medications as of 12/14/2023  Medication Sig   gabapentin (NEURONTIN) 100 MG capsule Take 1 capsule (100 mg total) by mouth at bedtime.   acetaminophen (TYLENOL) 500 MG tablet Take 500 mg by mouth every 6 (six) hours as needed.   amLODipine (NORVASC) 5  MG tablet Take 5 mg by mouth daily.   aspirin 81 MG chewable tablet Chew 1 tablet (81 mg total) by mouth daily.   atorvastatin (LIPITOR) 80 MG tablet Take 1 tablet (80 mg total) by mouth at bedtime.   buPROPion (WELLBUTRIN XL) 150 MG 24 hr tablet Take 150 mg by mouth daily.   cephALEXin (KEFLEX) 250 MG capsule Take 250 mg by mouth daily.   cholecalciferol (VITAMIN D3) 25 MCG (1000 UNIT) tablet Take 1 tablet (1,000 Units total) by mouth daily.   ciclopirox (PENLAC) 8 % solution APPLY TOPICALLY TO AFFECTED NAIL(S) AT BEDTIME. APPLY OVER NAIL & SURROUNDING SKIN. APPLY DAILY OVER PREVIOUS COAT. **AFTER 7 DAYS, MAY REMOVE WITH ALCOHOL AND CONTINUE CYCLE.**   Cranberry 500 MG CHEW Chew 1,000 mg by mouth daily.   docusate sodium (COLACE) 100 MG capsule  Take 100 mg by mouth 2 (two) times daily.   estradiol (ESTRACE) 0.1 MG/GM vaginal cream Place 1 Applicatorful vaginally daily. Every Tuesday and Friday   [START ON 12/15/2023] ferrous sulfate 325 (65 FE) MG tablet Take 1 tablet (325 mg total) by mouth every Monday, Wednesday, and Friday.   fexofenadine (ALLEGRA) 180 MG tablet Take 180 mg by mouth daily.   hydrochlorothiazide (HYDRODIURIL) 25 MG tablet Take 12.5 mg by mouth daily.   hydrocortisone (ANUSOL-HC) 2.5 % rectal cream Apply 1 Application topically 2 (two) times daily.   L-Lysine 1000 MG TABS Take 1,000 mg by mouth as needed. As needed for fever blister   lactulose (CHRONULAC) 10 GM/15ML solution Take 45 mLs (30 g total) by mouth 2 (two) times daily as needed for mild constipation or moderate constipation.   levothyroxine (SYNTHROID) 75 MCG tablet Take 1 tablet (75 mcg total) by mouth daily.   losartan (COZAAR) 50 MG tablet Take 50 mg by mouth daily.   MAGNESIUM GLYCINATE PO Take 1 each by mouth daily. gummy   Multiple Vitamins-Minerals (CENTRUM ADULTS MULTIGUMMIES PO) Take 2 each by mouth daily.   nystatin powder Apply 1 Application topically 2 (two) times daily. 60 g to groin folds twice daily    OLANZapine (ZYPREXA) 2.5 MG tablet Take 2.5 mg by mouth daily.   omeprazole (PRILOSEC) 20 MG capsule Take 40 mg by mouth 2 (two) times daily before a meal.   potassium chloride (KLOR-CON M) 10 MEQ tablet Take 10 mEq by mouth once.   venlafaxine XR (EFFEXOR-XR) 75 MG 24 hr capsule Take 3 capsules (225 mg total) by mouth daily.   Zinc Oxide 10 % OINT Apply 1 Application topically See admin instructions. Provide and apply to vaginal area 3 times daily as needed for itching,burning,moisture   [DISCONTINUED] ferrous sulfate 325 (65 FE) MG tablet Take 1 tablet (325 mg total) by mouth daily with breakfast. (Patient taking differently: Take 325 mg by mouth 3 (three) times a week.)   No facility-administered encounter medications on file as of 12/14/2023.    Review of Systems  Constitutional:  Negative for appetite change, chills, fatigue and fever.  HENT:  Negative for congestion, hearing loss, rhinorrhea and sore throat.   Eyes: Negative.   Respiratory:  Negative for cough, shortness of breath and wheezing.   Cardiovascular:  Negative for chest pain, palpitations and leg swelling.  Gastrointestinal:  Negative for abdominal pain, constipation, diarrhea, nausea and vomiting.  Genitourinary:  Negative for dysuria.  Musculoskeletal:  Negative for arthralgias, back pain and myalgias.  Skin:  Negative for color change, rash and wound.  Neurological:  Negative for dizziness, weakness and headaches.       Episodes of jerking arms  Psychiatric/Behavioral:  Negative for behavioral problems. The patient is not nervous/anxious.        Immunization History  Administered Date(s) Administered   DTaP 07/10/2022   Influenza Split 08/13/2017   Influenza, High Dose Seasonal PF 09/03/2015, 08/05/2016, 08/31/2018, 07/26/2019   Influenza, Quadrivalent, Recombinant, Inj, Pf 08/31/2018, 07/26/2019   Influenza,trivalent, recombinat, inj, PF 08/31/2018   Influenza-Unspecified 08/27/2012, 09/05/2013, 08/26/2016    PFIZER(Purple Top)SARS-COV-2 Vaccination 11/27/2019, 12/18/2019   PNEUMOCOCCAL CONJUGATE-20 02/05/2022   Pneumococcal Conjugate-13 10/05/2013, 07/26/2019   Pneumococcal Polysaccharide-23 03/25/2003   Td 03/25/2003   Tdap 03/25/2003   Zoster Recombinant(Shingrix) 12/27/2018, 07/26/2019   Pertinent  Health Maintenance Due  Topic Date Due   DEXA SCAN  Never done   INFLUENZA VACCINE  08/26/2024 (Originally 06/03/2023)   Colonoscopy  06/03/2025      07/07/2022    8:10 PM 10/05/2022    2:09 PM 01/12/2023    2:11 PM 07/23/2023    2:56 PM 10/06/2023    4:20 PM  Fall Risk  Falls in the past year?  0 0 1 1  Was there an injury with Fall?  0 0 1 0  Fall Risk Category Calculator  0 0 3 1  Fall Risk Category (Retired)  Low     (RETIRED) Patient Fall Risk Level Low fall risk      Patient at Risk for Falls Due to    History of fall(s);Impaired balance/gait;Impaired mobility History of fall(s);Impaired balance/gait;Impaired mobility  Fall risk Follow up    Falls evaluation completed;Education provided;Falls prevention discussed Falls evaluation completed;Education provided;Falls prevention discussed     Vitals:   12/14/23 1636  BP: 128/64  Pulse: 64  Resp: 20  Temp: 98.1 F (36.7 C)  SpO2: 94%  Weight: 199 lb 9.6 oz (90.5 kg)  Height: 5' 1.5" (1.562 m)   Body mass index is 37.1 kg/m.  Physical Exam Constitutional:      Appearance: She is obese.  HENT:     Head: Normocephalic and atraumatic.     Nose: Nose normal.     Mouth/Throat:     Mouth: Mucous membranes are moist.  Eyes:     Conjunctiva/sclera: Conjunctivae normal.  Cardiovascular:     Rate and Rhythm: Normal rate and regular rhythm.  Pulmonary:     Effort: Pulmonary effort is normal.     Breath sounds: Normal breath sounds.  Abdominal:     General: Bowel sounds are normal.     Palpations: Abdomen is soft.  Musculoskeletal:        General: Normal range of motion.     Cervical back: Normal range of motion.  Skin:     General: Skin is warm and dry.  Neurological:     General: No focal deficit present.     Mental Status: She is alert and oriented to person, place, and time.  Psychiatric:        Mood and Affect: Mood normal.        Behavior: Behavior normal.        Thought Content: Thought content normal.        Judgment: Judgment normal.       Labs reviewed: Recent Labs    04/07/23 0736 05/31/23 1430 06/23/23 1823 06/23/23 2023 06/24/23 1640 12/01/23 1420  NA 136   < > 140 138 139 140  K 3.7   < > 3.7 3.6 4.0 3.4*  CL 97*   < > 99  --  97* 102  CO2 26   < > 32  --  28 30  GLUCOSE 102*   < > 91  --  149* 97  BUN 10   < > 9  --  8 11  CREATININE 0.72   < > 0.63  --  0.75 0.63  CALCIUM 9.4   < > 9.7  --  9.5 9.5  MG 1.8  --   --   --   --   --    < > = values in this interval not displayed.   Recent Labs    05/31/23 1430 06/23/23 1823 12/01/23 1420  AST 18 23 25   ALT 23 27 22   ALKPHOS 70 68 66  BILITOT 0.3 0.6 0.4  PROT 6.4* 7.0 6.3*  ALBUMIN 4.0 4.2 4.2   Recent  Labs    04/07/23 0736 05/31/23 1430 06/23/23 1823 06/24/23 1640 06/25/23 0553 12/01/23 1420  WBC 6.0 4.8   < > 5.0 4.9 6.1  NEUTROABS 4.4 3.1  --   --   --  4.2  HGB 12.6 12.3   < > 13.1 12.7 11.6*  HCT 37.5 37.4   < > 38.8 39.0 35.4*  MCV 101.6* 102.7*   < > 99.0 101.0* 100.6*  PLT 200 225   < > 229 224 266   < > = values in this interval not displayed.   Lab Results  Component Value Date   TSH 0.567 06/23/2023   Lab Results  Component Value Date   HGBA1C 5.6 03/21/2022   Lab Results  Component Value Date   CHOL 297 (H) 03/21/2022   HDL 49 03/21/2022   LDLCALC 216 (H) 03/21/2022   TRIG 161 (H) 03/21/2022   CHOLHDL 6.1 03/21/2022    Significant Diagnostic Results in last 30 days:  No results found.  Assessment/Plan  1. Myoclonus (Primary) -  has episodes of jerking of arms, sometimes causing difficulty in sleeping -  will start on low-dose Gabapentin -  fall precautions - gabapentin  (NEURONTIN) 100 MG capsule; Take 1 capsule (100 mg total) by mouth at bedtime.  Dispense: 30 capsule; Refill: 3  2. Primary hypertension -  BP stable -  continue current regimen  3. Acquired hypothyroidism Lab Results  Component Value Date   TSH 0.567 06/23/2023    -  continue Levothyroxine  4. Gastroesophageal reflux disease without esophagitis -  stable -  continue Omeprazole  5. Iron deficiency anemia secondary to inadequate dietary iron intake Lab Results  Component Value Date   HGB 11.6 (L) 12/01/2023    -  continue FeSO4 - ferrous sulfate 325 (65 FE) MG tablet; Take 1 tablet (325 mg total) by mouth every Monday, Wednesday, and Friday.    Family/ staff Communication: Discussed plan of care with resident and charge nurse.  Labs/tests ordered:  None    Kenard Gower, DNP, MSN, FNP-BC [facility] [phone] (Monday-Friday 8:00 a.m. - 5:00 p.m.) [phone] (after hours)

## 2023-12-21 DIAGNOSIS — L814 Other melanin hyperpigmentation: Secondary | ICD-10-CM | POA: Diagnosis not present

## 2023-12-21 DIAGNOSIS — L821 Other seborrheic keratosis: Secondary | ICD-10-CM | POA: Diagnosis not present

## 2023-12-27 ENCOUNTER — Encounter: Payer: Self-pay | Admitting: Internal Medicine

## 2023-12-28 DIAGNOSIS — B351 Tinea unguium: Secondary | ICD-10-CM | POA: Diagnosis not present

## 2023-12-28 DIAGNOSIS — M79672 Pain in left foot: Secondary | ICD-10-CM | POA: Diagnosis not present

## 2023-12-28 DIAGNOSIS — L602 Onychogryphosis: Secondary | ICD-10-CM | POA: Diagnosis not present

## 2023-12-28 DIAGNOSIS — M79671 Pain in right foot: Secondary | ICD-10-CM | POA: Diagnosis not present

## 2024-01-18 DIAGNOSIS — L814 Other melanin hyperpigmentation: Secondary | ICD-10-CM | POA: Diagnosis not present

## 2024-01-18 DIAGNOSIS — L853 Xerosis cutis: Secondary | ICD-10-CM | POA: Diagnosis not present

## 2024-01-18 DIAGNOSIS — L821 Other seborrheic keratosis: Secondary | ICD-10-CM | POA: Diagnosis not present

## 2024-01-20 ENCOUNTER — Non-Acute Institutional Stay: Payer: Self-pay | Admitting: Internal Medicine

## 2024-01-20 DIAGNOSIS — N390 Urinary tract infection, site not specified: Secondary | ICD-10-CM

## 2024-01-20 DIAGNOSIS — K219 Gastro-esophageal reflux disease without esophagitis: Secondary | ICD-10-CM | POA: Diagnosis not present

## 2024-01-20 DIAGNOSIS — D508 Other iron deficiency anemias: Secondary | ICD-10-CM

## 2024-01-20 DIAGNOSIS — R251 Tremor, unspecified: Secondary | ICD-10-CM | POA: Diagnosis not present

## 2024-01-20 DIAGNOSIS — I10 Essential (primary) hypertension: Secondary | ICD-10-CM | POA: Diagnosis not present

## 2024-01-20 DIAGNOSIS — Z8673 Personal history of transient ischemic attack (TIA), and cerebral infarction without residual deficits: Secondary | ICD-10-CM

## 2024-01-20 DIAGNOSIS — G4733 Obstructive sleep apnea (adult) (pediatric): Secondary | ICD-10-CM

## 2024-01-20 DIAGNOSIS — F339 Major depressive disorder, recurrent, unspecified: Secondary | ICD-10-CM

## 2024-01-20 DIAGNOSIS — E039 Hypothyroidism, unspecified: Secondary | ICD-10-CM | POA: Diagnosis not present

## 2024-01-20 NOTE — Progress Notes (Signed)
 Location:  [facility] of Service:  [facility] [phone])  Provider:   Code Status:  Goals of Care:     12/01/2023    3:02 PM  Advanced Directives  Does Patient Have a Medical Advance Directive? Yes  Type of Estate agent of [place];Living will  Does patient want to make changes to medical advance directive? No - Patient declined  Copy of Healthcare Power of Attorney in Chart? No - copy requested  Would patient like information on creating a medical advance directive? No - Patient declined     Chief Complaint  Patient presents with   Care Management    HPI: Patient is a 83 y.o. female seen today for medical management of chronic diseases.   Lives in [place] in [facility]  Patient does have a history of hypertension   History of CVA Bilateral Basal Ganglia Infarct Also has history of hyperlipidemia and hypothyroidism   History of triple negative invasive ductal carcinoma of left breast S/p partial mastectomy in 2018 followed by chemo and radiation.  She continues to have a Port-A-Cath which gets flushed every few months by oncology  For her depression I had started her on Wellbutrin but patient recently was noted to have tremors in her Hands and she thinks it has been since she has been on Wellbutrin Had no other complains Has flat effect But Manages well in [place]  Walks with her walker Wt Readings from Last 3 Encounters:  12/14/23 199 lb 9.6 oz (90.5 kg)  12/01/23 199 lb (90.3 kg)  11/25/23 200 lb (90.7 kg)    Past Medical History:  Diagnosis Date   Allergy    Anemia    Anxiety    Asthma    in past, no inhalers now   Blood transfusion without reported diagnosis    Breast cancer (HCC) 01/2017   left breast    Breast cancer of upper-inner quadrant of left female breast (HCC) 03/11/2017   Cancer (HCC) 01/2017   left breast   Cataract    bilateral removed   Chronic kidney disease    kidney stones   Depression 08/31/2013   Dyslipidemia, goal LDL  below 160 08/31/2013   Essential hypertension - well controlled 08/31/2013   Family history of breast cancer    Family history of melanoma    Family history of prostate cancer    GERD (gastroesophageal reflux disease)    Goals of care, counseling/discussion 03/11/2017   History of radiation therapy 10/13/17-11/11/17   left breast 40.05 Gy in 15 fractions, left breast boost 10 Gy in 5 fractions   Hypothyroidism    Ischemic stroke [facility])    May 2023;   Obesity (BMI 30-39.9) 08/31/2013   Personal history of chemotherapy 2018   Personal history of radiation therapy 2019   Thyroid disease    Vasovagal near-syncope -rare 08/31/2013    Past Surgical History:  Procedure Laterality Date   ABDOMINAL HYSTERECTOMY     total   BREAST BIOPSY Left 02/24/2017    malignant   BREAST LUMPECTOMY Left 03/23/2017   broken fingers     CATARACT EXTRACTION Bilateral    COLONOSCOPY     COLONOSCOPY WITH PROPOFOL N/A 06/03/2022   Procedure: COLONOSCOPY WITH PROPOFOL;  Surgeon: Jenel Lucks, MD;  Location: [facility];  Service: Gastroenterology;  Laterality: N/A;   ESOPHAGOGASTRODUODENOSCOPY (EGD) WITH PROPOFOL N/A 05/29/2022   Procedure: ESOPHAGOGASTRODUODENOSCOPY (EGD) WITH PROPOFOL;  Surgeon: Beverley Fiedler, MD;  Location: [facility];  Service: Gastroenterology;  Laterality: N/A;   EXCISIONAL HEMORRHOIDECTOMY     IR FLUORO GUIDE PORT INSERTION RIGHT  08/04/2017   IR REMOVAL TUN ACCESS W/ PORT W/O FL MOD SED  08/04/2017   IR US GUIDE VASC ACCESS RIGHT  08/04/2017   IRRIGATION AND DEBRIDEMENT ABSCESS Left 05/18/2017   Procedure: IRRIGATION AND DEBRIDEMENT LEFT AXILLARY ABSCESS;  Surgeon: Karie Soda, MD;  Location: [facility];  Service: General;  Laterality: Left;   kidney stones lithotripsy     POLYPECTOMY  06/03/2022   Procedure: POLYPECTOMY;  Surgeon: Jenel Lucks, MD;  Location: [facility];  Service: Gastroenterology;;   Tod [place] PLACEMENT Right 03/23/2017   Procedure: INSERTION PORT-A-CATH;   Surgeon: Harriette Bouillon, MD;  Location: [facility];  Service: General;  Laterality: Right;   RADIOACTIVE SEED GUIDED PARTIAL MASTECTOMY WITH AXILLARY SENTINEL LYMPH NODE BIOPSY Left 03/23/2017   Procedure: LEFT BREAST RADIOACTIVE SEED GUIDED PARTIAL MASTECTOMY AND  SENTINEL LYMPH NODE MAPPING;  Surgeon: Harriette Bouillon, MD;  Location: [facility];  Service: General;  Laterality: Left;   ROTATOR CUFF REPAIR     left    TONSILLECTOMY     WISDOM TOOTH EXTRACTION      Allergies  Allergen Reactions   Benazepril Anaphylaxis    Possible cause of tongue swelling   Penicillins Anaphylaxis    Has patient had a PCN reaction causing immediate rash, facial/tongue/throat swelling, SOB or lightheadedness with hypotension: yes, Has patient had a PCN reaction causing severe rash involving mucus membranes or skin necrosis:  no, Has patient had a PCN reaction that required hospitalization:  no, Has patient had a PCN reaction occurring within the last 10 years: no, If all of the above answers are "NO", then may proceed with Cephalosporin use.   Advil [Ibuprofen] Itching and Rash    Rash that turns to blisters   Codeine Hives    hives   Crestor [Rosuvastatin Calcium] Other (See Comments)    Pain in joints   Sulfamethoxazole-Trimethoprim Hives    hives   Bacitracin-Polymyxin B Dermatitis and Rash   Other Rash    Band-aid    Outpatient Encounter Medications as of 01/20/2024  Medication Sig   acetaminophen (TYLENOL) 500 MG tablet Take 500 mg by mouth every 6 (six) hours as needed.   amLODipine (NORVASC) 5 MG tablet Take 5 mg by mouth daily.   aspirin 81 MG chewable tablet Chew 1 tablet (81 mg total) by mouth daily.   atorvastatin (LIPITOR) 80 MG tablet Take 1 tablet (80 mg total) by mouth at bedtime.   buPROPion (WELLBUTRIN XL) 150 MG 24 hr tablet Take 150 mg by mouth daily.   cephALEXin (KEFLEX) 250 MG capsule Take 250 mg by mouth daily.   cholecalciferol (VITAMIN D3) 25 MCG  (1000 UNIT) tablet Take 1 tablet (1,000 Units total) by mouth daily.   ciclopirox (PENLAC) 8 % solution APPLY TOPICALLY TO AFFECTED NAIL(S) AT BEDTIME. APPLY OVER NAIL & SURROUNDING SKIN. APPLY DAILY OVER PREVIOUS COAT. **AFTER 7 DAYS, MAY REMOVE WITH ALCOHOL AND CONTINUE CYCLE.**   Cranberry 500 MG CHEW Chew 1,000 mg by mouth daily.   docusate sodium (COLACE) 100 MG capsule Take 100 mg by mouth 2 (two) times daily.   estradiol (ESTRACE) 0.1 MG/GM vaginal cream Place 1 Applicatorful vaginally daily. Every Tuesday and Friday   ferrous sulfate 325 (65 FE) MG tablet Take 1 tablet (325 mg total) by mouth every Monday, Wednesday, and Friday.   fexofenadine (ALLEGRA) 180 MG tablet Take  180 mg by mouth daily.   gabapentin (NEURONTIN) 100 MG capsule Take 1 capsule (100 mg total) by mouth at bedtime.   hydrochlorothiazide (HYDRODIURIL) 25 MG tablet Take 12.5 mg by mouth daily.   hydrocortisone (ANUSOL-HC) 2.5 % rectal cream Apply 1 Application topically 2 (two) times daily.   L-Lysine 1000 MG TABS Take 1,000 mg by mouth as needed. As needed for fever blister   lactulose (CHRONULAC) 10 GM/15ML solution Take 45 mLs (30 g total) by mouth 2 (two) times daily as needed for mild constipation or moderate constipation.   levothyroxine (SYNTHROID) 75 MCG tablet Take 1 tablet (75 mcg total) by mouth daily.   losartan (COZAAR) 50 MG tablet Take 50 mg by mouth daily.   MAGNESIUM GLYCINATE PO Take 1 each by mouth daily. gummy   Multiple Vitamins-Minerals (CENTRUM ADULTS MULTIGUMMIES PO) Take 2 each by mouth daily.   nystatin powder Apply 1 Application topically 2 (two) times daily. 60 g to groin folds twice daily   OLANZapine (ZYPREXA) 2.5 MG tablet Take 2.5 mg by mouth daily.   omeprazole (PRILOSEC) 20 MG capsule Take 40 mg by mouth 2 (two) times daily before a meal.   potassium chloride (KLOR-CON M) 10 MEQ tablet Take 10 mEq by mouth once.   venlafaxine XR (EFFEXOR-XR) 75 MG 24 hr capsule Take 3 capsules (225 mg  total) by mouth daily.   Zinc Oxide 10 % OINT Apply 1 Application topically See admin instructions. Provide and apply to vaginal area 3 times daily as needed for itching,burning,moisture   No facility-administered encounter medications on file as of 01/20/2024.    Review of Systems:  Review of Systems  Constitutional:  Negative for activity change and appetite change.  HENT: Negative.    Respiratory:  Negative for cough and shortness of breath.   Cardiovascular:  Negative for leg swelling.  Gastrointestinal:  Negative for constipation.  Genitourinary: Negative.   Musculoskeletal:  Positive for gait problem. Negative for arthralgias and myalgias.  Skin: Negative.   Neurological:  Positive for tremors. Negative for dizziness and weakness.  Psychiatric/Behavioral:  Positive for dysphoric mood. Negative for confusion and sleep disturbance.     Health Maintenance  Topic Date Due   DEXA SCAN  Never done   COVID-19 Vaccine (3 - Pfizer risk series) 01/15/2020   INFLUENZA VACCINE  08/26/2024 (Originally 06/03/2023)   Medicare Annual Wellness (AWV)  07/22/2024   Colonoscopy  06/03/2025   DTaP/Tdap/Td (4 - Td or Tdap) 07/10/2032   Pneumonia Vaccine 35+ Years old  Completed   Zoster Vaccines- Shingrix  Completed   HPV VACCINES  Aged Out    Physical Exam: There were no vitals filed for this visit. There is no height or weight on file to calculate BMI. Physical Exam Vitals reviewed.  Constitutional:      Appearance: Normal appearance.  HENT:     Head: Normocephalic.     Nose: Nose normal.     Mouth/Throat:     Mouth: Mucous membranes are moist.     Pharynx: Oropharynx is clear.  Eyes:     Pupils: Pupils are equal, round, and reactive to light.  Cardiovascular:     Rate and Rhythm: Normal rate and regular rhythm.     Pulses: Normal pulses.     Heart sounds: Normal heart sounds. No murmur heard. Pulmonary:     Effort: Pulmonary effort is normal.     Breath sounds: Normal breath  sounds.  Abdominal:     General: Abdomen is  flat. Bowel sounds are normal.     Palpations: Abdomen is soft.  Musculoskeletal:        General: No swelling.     Cervical back: Neck supple.  Skin:    General: Skin is warm.  Neurological:     General: No focal deficit present.     Mental Status: She is alert and oriented to person, place, and time.     Comments: Has fine resting  tremors in her Both Hands   Psychiatric:        Mood and Affect: Mood normal.        Thought Content: Thought content normal.     Labs reviewed: Basic Metabolic Panel: Recent Labs    04/07/23 0736 05/31/23 1430 06/23/23 1823 06/23/23 2012 06/23/23 2023 06/24/23 1640 12/01/23 1420  NA 136   < > 140  --  138 139 140  K 3.7   < > 3.7  --  3.6 4.0 3.4*  CL 97*   < > 99  --   --  97* 102  CO2 26   < > 32  --   --  28 30  GLUCOSE 102*   < > 91  --   --  149* 97  BUN 10   < > 9  --   --  8 11  CREATININE 0.72   < > 0.63  --   --  0.75 0.63  CALCIUM 9.4   < > 9.7  --   --  9.5 9.5  MG 1.8  --   --   --   --   --   --   TSH  --   --   --  0.567  --   --   --    < > = values in this interval not displayed.   Liver Function Tests: Recent Labs    05/31/23 1430 06/23/23 1823 12/01/23 1420  AST 18 23 25   ALT 23 27 22   ALKPHOS 70 68 66  BILITOT 0.3 0.6 0.4  PROT 6.4* 7.0 6.3*  ALBUMIN 4.0 4.2 4.2   No results for input(s): "LIPASE", "AMYLASE" in the last 8760 hours. Recent Labs    06/23/23 2012  AMMONIA 21   CBC: Recent Labs    04/07/23 0736 05/31/23 1430 06/23/23 1823 06/24/23 1640 06/25/23 0553 12/01/23 1420  WBC 6.0 4.8   < > 5.0 4.9 6.1  NEUTROABS 4.4 3.1  --   --   --  4.2  HGB 12.6 12.3   < > 13.1 12.7 11.6*  HCT 37.5 37.4   < > 38.8 39.0 35.4*  MCV 101.6* 102.7*   < > 99.0 101.0* 100.6*  PLT 200 225   < > 229 224 266   < > = values in this interval not displayed.   Lipid Panel: No results for input(s): "CHOL", "HDL", "LDLCALC", "TRIG", "CHOLHDL", "LDLDIRECT" in the last  8760 hours. Lab Results  Component Value Date   HGBA1C 5.6 03/21/2022    Procedures since last visit: No results found.  Assessment/Plan 1. Tremor of both hands (Primary) Discontinue Wellbutrin and Reval She is on Low dose of Zyprexa also   2. Primary hypertension Norvasc, Cozaar and HCTZ  3. Acquired hypothyroidism TSH normal in 12/24  4. Gastroesophageal reflux disease without esophagitis Prilosec  5. Iron deficiency anemia secondary to inadequate dietary iron intake On iron  6. Recurrent depression (HCC) High dose of Effexor and Zyprexa Discontinued Wellbutrin for now  7.  OSA (obstructive sleep apnea) Does not uses CPAP  8. History of CVA (cerebrovascular accident) Aspirin and statin LDL 79 in 12/24 9. Recurrent UTI Keflex and Cranberry and estrace 10 HLD On statin 11 Gabapentin was started for ? Myoclonus jerks Will not chang e it today Follow in few weeks  Labs/tests ordered:  * No order type specified * Next appt:  Visit date not found

## 2024-01-23 ENCOUNTER — Encounter: Payer: Self-pay | Admitting: Internal Medicine

## 2024-01-25 ENCOUNTER — Inpatient Hospital Stay: Payer: Medicare Other

## 2024-01-27 ENCOUNTER — Inpatient Hospital Stay: Attending: Hematology & Oncology

## 2024-01-27 DIAGNOSIS — Z1722 Progesterone receptor negative status: Secondary | ICD-10-CM | POA: Insufficient documentation

## 2024-01-27 DIAGNOSIS — C50912 Malignant neoplasm of unspecified site of left female breast: Secondary | ICD-10-CM | POA: Insufficient documentation

## 2024-01-27 DIAGNOSIS — Z1732 Human epidermal growth factor receptor 2 negative status: Secondary | ICD-10-CM | POA: Diagnosis not present

## 2024-01-27 DIAGNOSIS — Z171 Estrogen receptor negative status [ER-]: Secondary | ICD-10-CM | POA: Insufficient documentation

## 2024-01-27 DIAGNOSIS — Z95828 Presence of other vascular implants and grafts: Secondary | ICD-10-CM

## 2024-01-27 MED ORDER — SODIUM CHLORIDE 0.9% FLUSH
10.0000 mL | INTRAVENOUS | Status: DC | PRN
Start: 2024-01-27 — End: 2024-01-27
  Administered 2024-01-27: 10 mL via INTRAVENOUS

## 2024-01-27 MED ORDER — HEPARIN SOD (PORK) LOCK FLUSH 100 UNIT/ML IV SOLN
500.0000 [IU] | Freq: Once | INTRAVENOUS | Status: AC
  Administered 2024-01-27: 500 [IU] via INTRAVENOUS

## 2024-02-15 DIAGNOSIS — L821 Other seborrheic keratosis: Secondary | ICD-10-CM | POA: Diagnosis not present

## 2024-02-15 DIAGNOSIS — L814 Other melanin hyperpigmentation: Secondary | ICD-10-CM | POA: Diagnosis not present

## 2024-02-15 DIAGNOSIS — L820 Inflamed seborrheic keratosis: Secondary | ICD-10-CM | POA: Diagnosis not present

## 2024-03-08 DIAGNOSIS — I679 Cerebrovascular disease, unspecified: Secondary | ICD-10-CM | POA: Diagnosis not present

## 2024-03-08 DIAGNOSIS — M6281 Muscle weakness (generalized): Secondary | ICD-10-CM | POA: Diagnosis not present

## 2024-03-08 DIAGNOSIS — I69911 Memory deficit following unspecified cerebrovascular disease: Secondary | ICD-10-CM | POA: Diagnosis not present

## 2024-03-11 DIAGNOSIS — M6281 Muscle weakness (generalized): Secondary | ICD-10-CM | POA: Diagnosis not present

## 2024-03-11 DIAGNOSIS — I69911 Memory deficit following unspecified cerebrovascular disease: Secondary | ICD-10-CM | POA: Diagnosis not present

## 2024-03-11 DIAGNOSIS — I679 Cerebrovascular disease, unspecified: Secondary | ICD-10-CM | POA: Diagnosis not present

## 2024-03-14 DIAGNOSIS — I679 Cerebrovascular disease, unspecified: Secondary | ICD-10-CM | POA: Diagnosis not present

## 2024-03-14 DIAGNOSIS — I69911 Memory deficit following unspecified cerebrovascular disease: Secondary | ICD-10-CM | POA: Diagnosis not present

## 2024-03-14 DIAGNOSIS — B351 Tinea unguium: Secondary | ICD-10-CM | POA: Diagnosis not present

## 2024-03-14 DIAGNOSIS — M6281 Muscle weakness (generalized): Secondary | ICD-10-CM | POA: Diagnosis not present

## 2024-03-14 DIAGNOSIS — M79671 Pain in right foot: Secondary | ICD-10-CM | POA: Diagnosis not present

## 2024-03-14 DIAGNOSIS — M79672 Pain in left foot: Secondary | ICD-10-CM | POA: Diagnosis not present

## 2024-03-14 DIAGNOSIS — L602 Onychogryphosis: Secondary | ICD-10-CM | POA: Diagnosis not present

## 2024-03-16 DIAGNOSIS — M6281 Muscle weakness (generalized): Secondary | ICD-10-CM | POA: Diagnosis not present

## 2024-03-16 DIAGNOSIS — I69911 Memory deficit following unspecified cerebrovascular disease: Secondary | ICD-10-CM | POA: Diagnosis not present

## 2024-03-16 DIAGNOSIS — I679 Cerebrovascular disease, unspecified: Secondary | ICD-10-CM | POA: Diagnosis not present

## 2024-03-21 ENCOUNTER — Other Ambulatory Visit: Payer: Self-pay

## 2024-03-21 ENCOUNTER — Inpatient Hospital Stay: Payer: Medicare Other | Attending: Hematology & Oncology

## 2024-03-21 VITALS — BP 134/60 | HR 79 | Temp 98.1°F | Resp 18

## 2024-03-21 DIAGNOSIS — L821 Other seborrheic keratosis: Secondary | ICD-10-CM | POA: Diagnosis not present

## 2024-03-21 DIAGNOSIS — Z853 Personal history of malignant neoplasm of breast: Secondary | ICD-10-CM | POA: Diagnosis not present

## 2024-03-21 DIAGNOSIS — C50212 Malignant neoplasm of upper-inner quadrant of left female breast: Secondary | ICD-10-CM

## 2024-03-21 DIAGNOSIS — I679 Cerebrovascular disease, unspecified: Secondary | ICD-10-CM | POA: Diagnosis not present

## 2024-03-21 DIAGNOSIS — L820 Inflamed seborrheic keratosis: Secondary | ICD-10-CM | POA: Diagnosis not present

## 2024-03-21 DIAGNOSIS — Z9221 Personal history of antineoplastic chemotherapy: Secondary | ICD-10-CM | POA: Diagnosis not present

## 2024-03-21 DIAGNOSIS — Z923 Personal history of irradiation: Secondary | ICD-10-CM | POA: Insufficient documentation

## 2024-03-21 DIAGNOSIS — I69911 Memory deficit following unspecified cerebrovascular disease: Secondary | ICD-10-CM | POA: Diagnosis not present

## 2024-03-21 DIAGNOSIS — M6281 Muscle weakness (generalized): Secondary | ICD-10-CM | POA: Diagnosis not present

## 2024-03-21 DIAGNOSIS — L814 Other melanin hyperpigmentation: Secondary | ICD-10-CM | POA: Diagnosis not present

## 2024-03-21 DIAGNOSIS — Z171 Estrogen receptor negative status [ER-]: Secondary | ICD-10-CM

## 2024-03-21 MED ORDER — SODIUM CHLORIDE 0.9% FLUSH
10.0000 mL | INTRAVENOUS | Status: DC | PRN
Start: 2024-03-21 — End: 2024-03-21
  Administered 2024-03-21: 10 mL via INTRAVENOUS

## 2024-03-21 MED ORDER — LIDOCAINE-PRILOCAINE 2.5-2.5 % EX CREA: 1.0000 | TOPICAL_CREAM | CUTANEOUS | 3 refills | Status: DC | PRN

## 2024-03-21 MED ORDER — HEPARIN SOD (PORK) LOCK FLUSH 100 UNIT/ML IV SOLN
500.0000 [IU] | Freq: Once | INTRAVENOUS | Status: AC
  Administered 2024-03-21: 500 [IU] via INTRAVENOUS

## 2024-03-21 NOTE — Patient Instructions (Signed)

## 2024-03-23 DIAGNOSIS — M6281 Muscle weakness (generalized): Secondary | ICD-10-CM | POA: Diagnosis not present

## 2024-03-23 DIAGNOSIS — I679 Cerebrovascular disease, unspecified: Secondary | ICD-10-CM | POA: Diagnosis not present

## 2024-03-23 DIAGNOSIS — I69911 Memory deficit following unspecified cerebrovascular disease: Secondary | ICD-10-CM | POA: Diagnosis not present

## 2024-03-27 IMAGING — CT CT HEAD W/O CM
4 series · 17 of 47 positions shown, 19 images · non-contrast
Comparison: 12/31/2021

CLINICAL DATA: Altered mental status and right facial droop



[Series 3: head wo · axial · 0.43mm/px · z∈[-137,-17]mm · 7 of 33 slices shown, 9 images]
[im 5/33  brain]
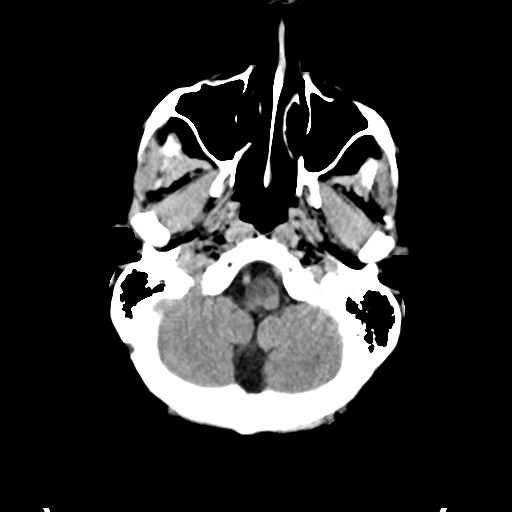
[im 5/33  bone]
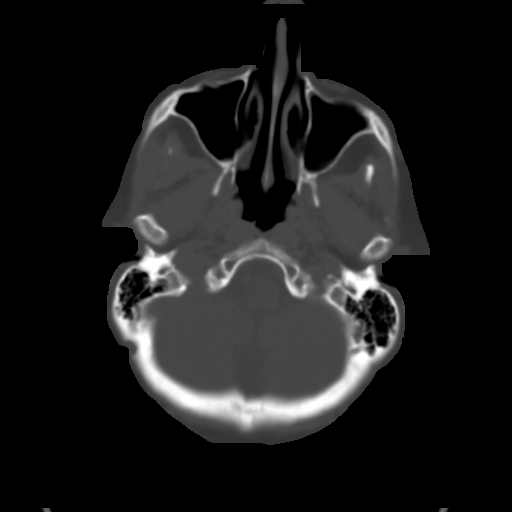
[im 9/33  brain]
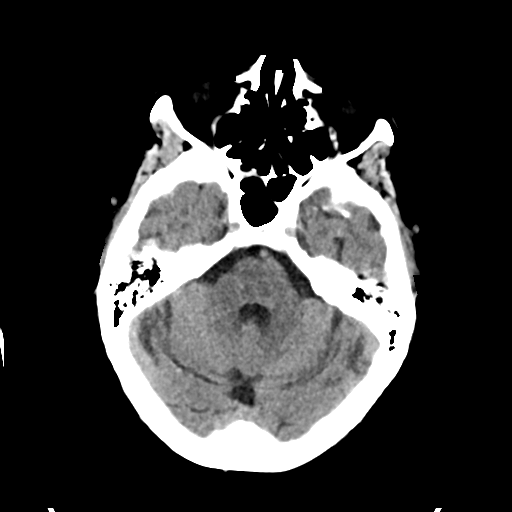
[im 13/33  brain]
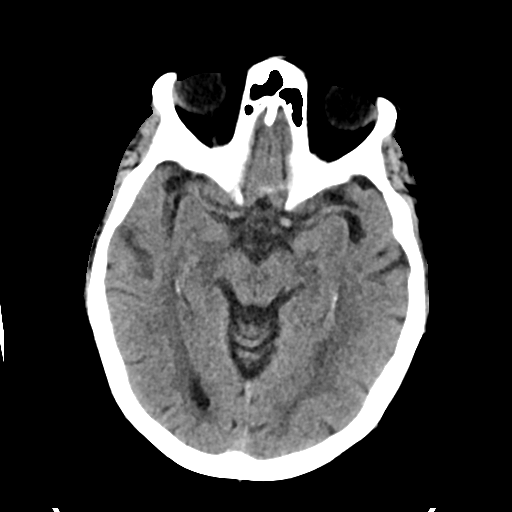
[im 17/33  brain]
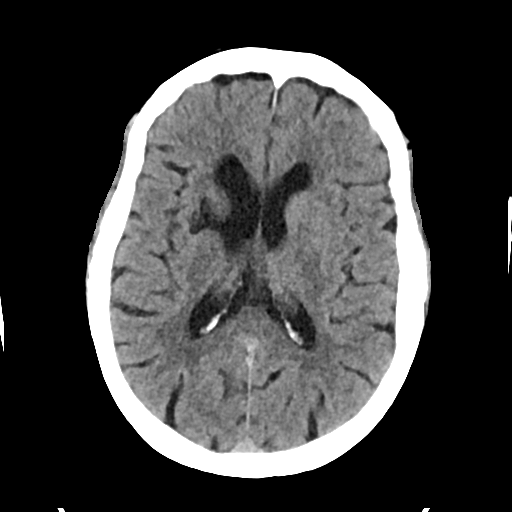
[im 21/33  brain]
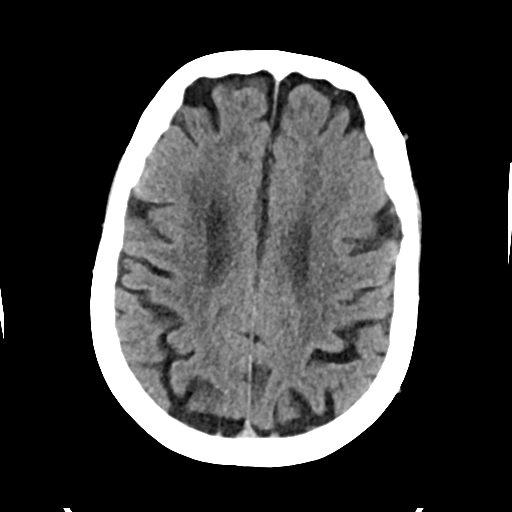
[im 21/33  bone]
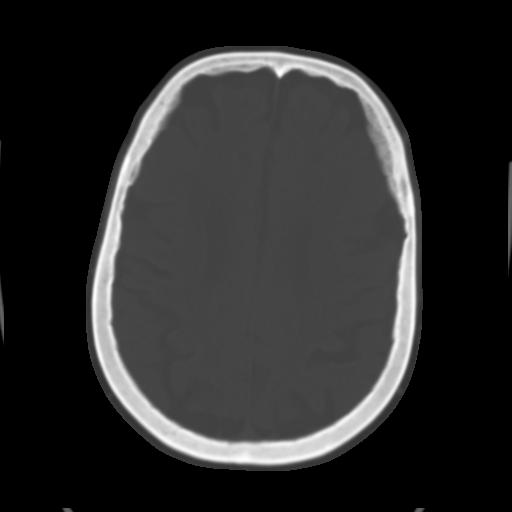
[im 25/33  brain]
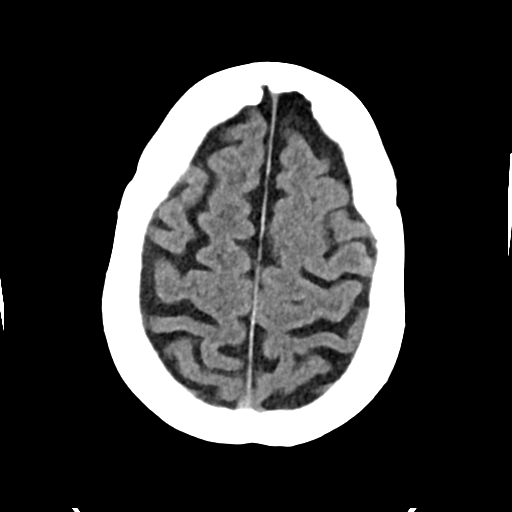
[im 29/33  brain]
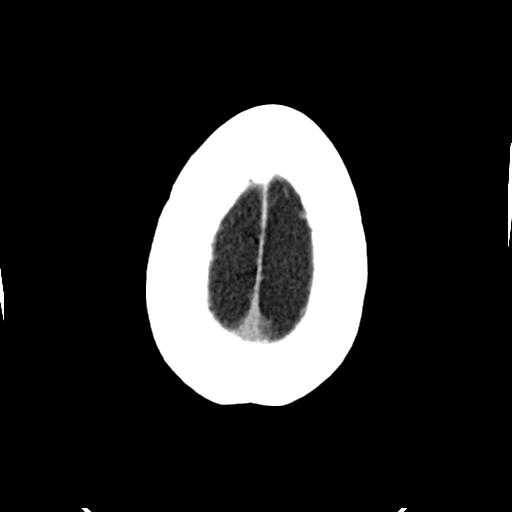

[Series 4: head bone · axial · 0.43mm/px · z∈[-141,-85]mm · 4 of 82 slices shown]
[im 9/82  bone]
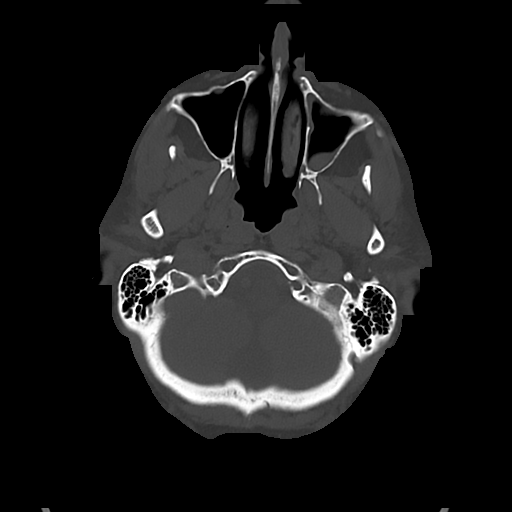
[im 17/82  bone]
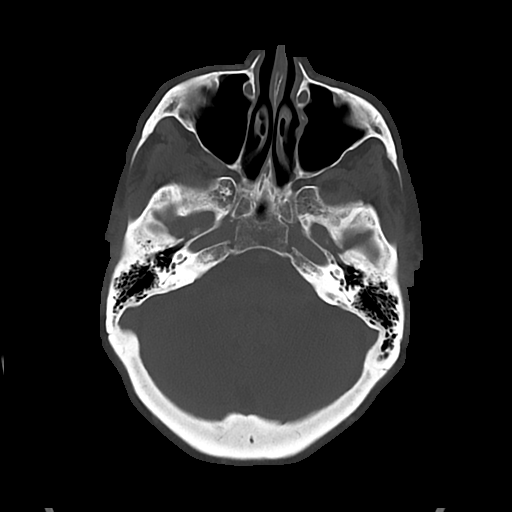
[im 25/82  bone]
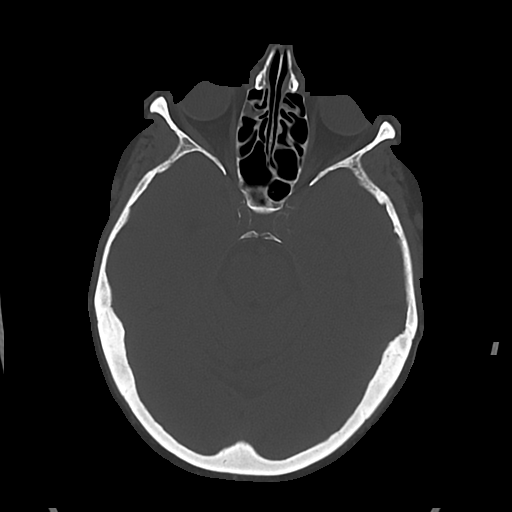
[im 37/82  bone]
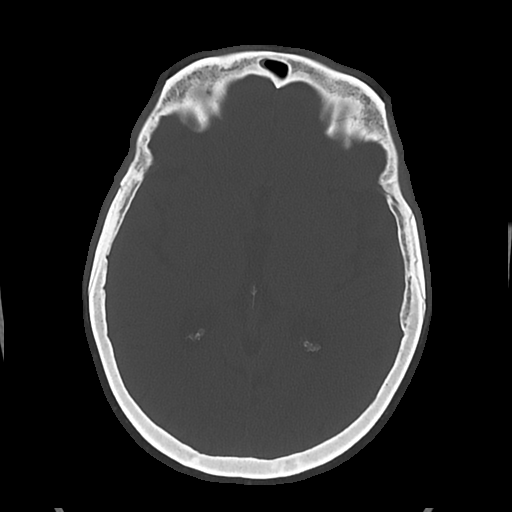

[Series 5: cor soft · coronal · 0.38mm/px · 3 of 71 slices shown]
[im 24/71  brain]
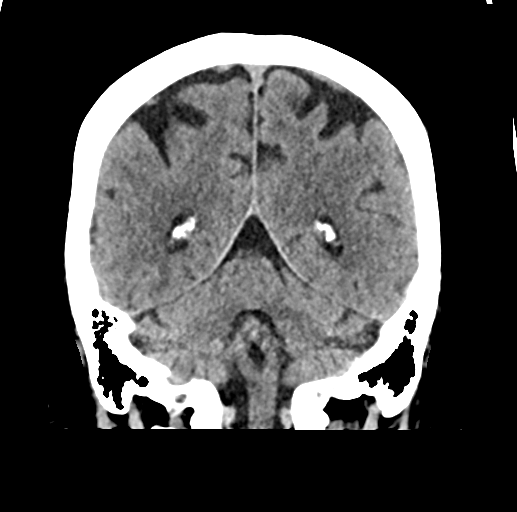
[im 32/71  brain]
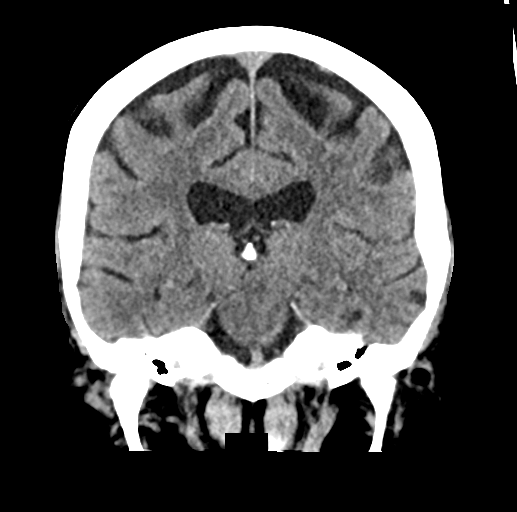
[im 39/71  brain]
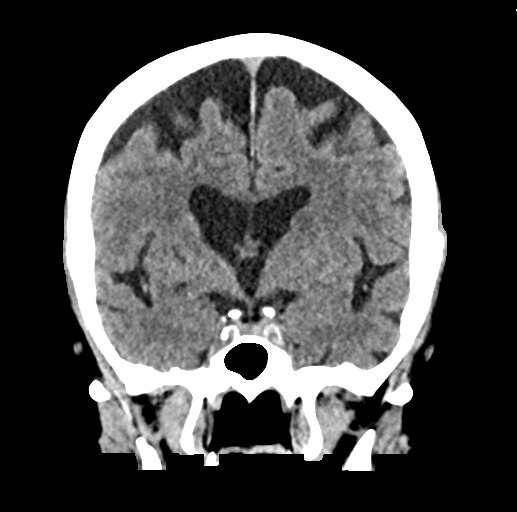

[Series 6: sag soft · sagittal · 0.38mm/px · 3 of 60 slices shown]
[im 20/60  brain]
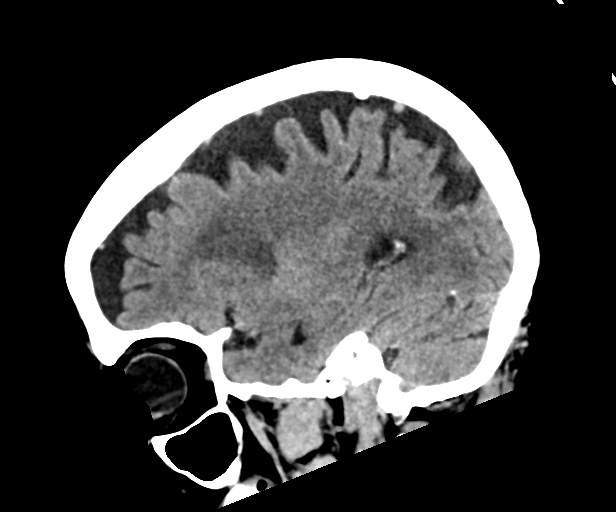
[im 30/60  brain]
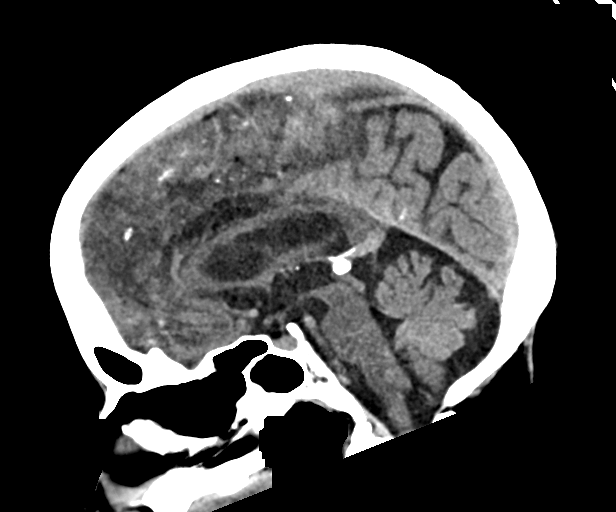
[im 40/60  brain]
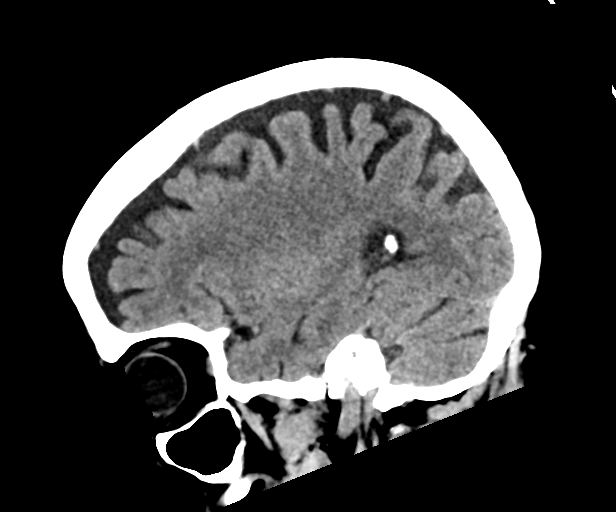

[17 of 47 positions shown; findings below may reference images not displayed]

FINDINGS: Brain: Chronic atrophic changes are noted. Old lacunar infarct in
the right internal capsule, right basal ganglia and adjacent to the
head of the caudate nucleus is seen. No acute hemorrhage or acute
infarction is noted.

Vascular: No hyperdense vessel or unexpected calcification.

Skull: Normal. Negative for fracture or focal lesion.

Sinuses/Orbits: No acute finding.

Other: None.
IMPRESSION: Chronic ischemic change without acute abnormality.

## 2024-03-28 DIAGNOSIS — M6281 Muscle weakness (generalized): Secondary | ICD-10-CM | POA: Diagnosis not present

## 2024-03-28 DIAGNOSIS — I69911 Memory deficit following unspecified cerebrovascular disease: Secondary | ICD-10-CM | POA: Diagnosis not present

## 2024-03-28 DIAGNOSIS — I679 Cerebrovascular disease, unspecified: Secondary | ICD-10-CM | POA: Diagnosis not present

## 2024-03-28 IMAGING — MR MR HEAD WO/W CM
12 of 14 series · 38 of 48 positions shown · IV contrast (gadavist)
Comparison: 10/19/2020

CLINICAL DATA: Slurred speech

EXAM:
MRI HEAD WITHOUT AND WITH CONTRAST
TECHNIQUE: Multiplanar, multiecho pulse sequences of the brain and surrounding
structures were obtained without and with intravenous contrast.
CONTRAST:  8mL GADAVIST GADOBUTROL 1 MMOL/ML IV SOLN

[Series 5: DWI · axial · 3.0mm · 0.88mm/px · z∈[-53,+92]mm · 7 of 100 slices shown (1 of 4)]
[im 1/100]
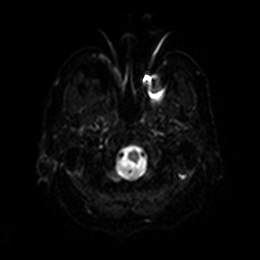
[im 17/100]
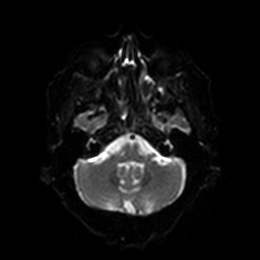
[im 34/100]
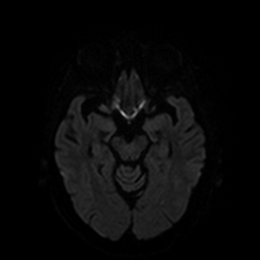
[im 50/100]
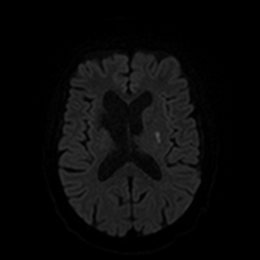
[im 67/100]
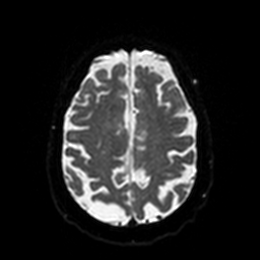
[im 83/100]
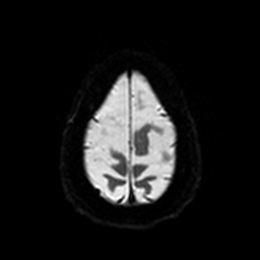
[im 100/100]
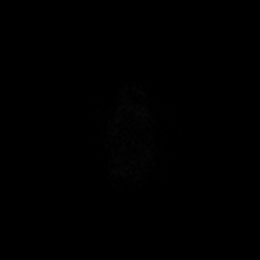

[Series 6: DWI · axial · 3.0mm · 0.88mm/px · z∈[-53,+92]mm · 3 of 50 slices shown (2 of 4)]
[im 1/50]
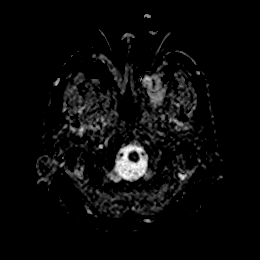
[im 25/50]
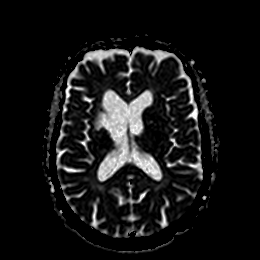
[im 50/50]
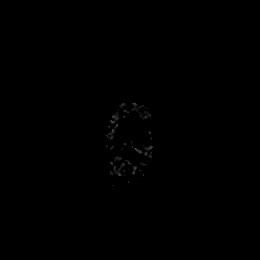

[Series 7: DWI · coronal · 4.0mm · 0.88mm/px · 4 of 64 slices shown (3 of 4)]
[im 1/64]
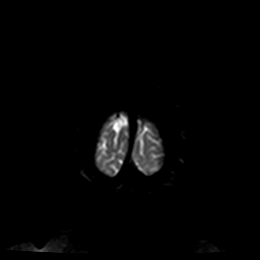
[im 22/64]
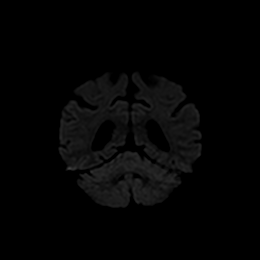
[im 43/64]
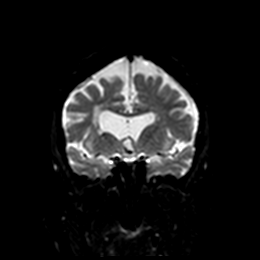
[im 64/64]
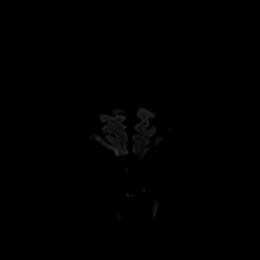

[Series 8: DWI · coronal · 4.0mm · 0.88mm/px · 2 of 32 slices shown (4 of 4)]
[im 1/32]
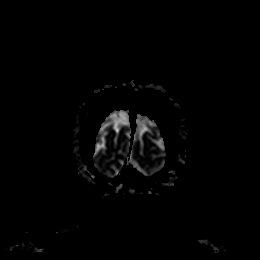
[im 32/32]
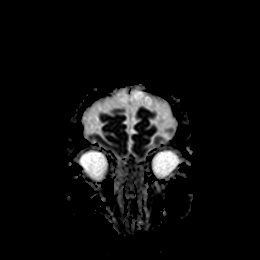

[Series 9: T1 · sagittal · 5.0mm · 0.75mm/px · 2 of 23 slices shown]
[im 1/23]
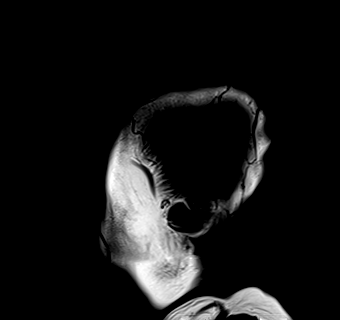
[im 23/23]
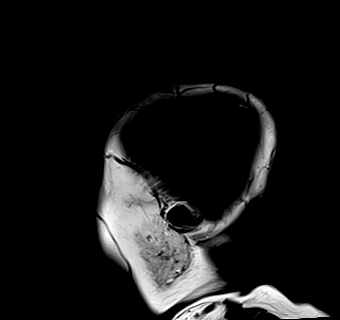

[Series 10: T2 · axial · 5.0mm · 0.72mm/px · z∈[-55,+86]mm · 2 of 25 slices shown]
[im 1/25]
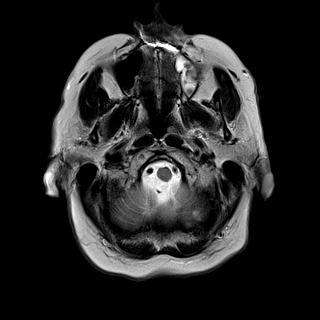
[im 25/25]
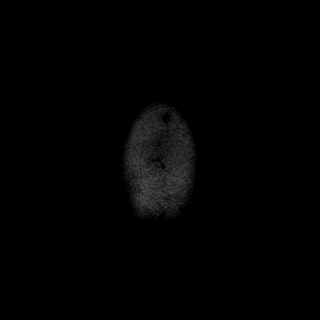

[Series 11: FLAIR · axial · 5.0mm · 0.45mm/px · z∈[-56,+86]mm · 2 of 25 slices shown]
[im 1/25]
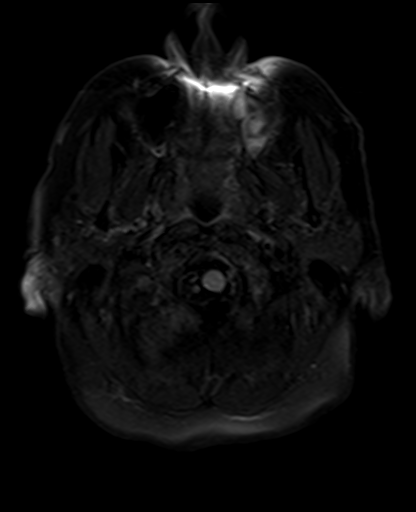
[im 25/25]
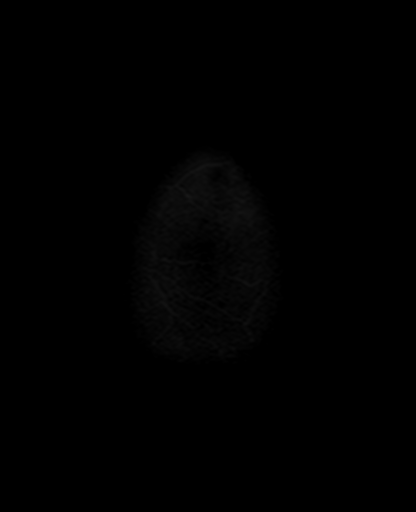

[Series 12: mag_images · axial · 3.0mm · 0.90mm/px · z∈[-72,+102]mm · 5 of 60 slices shown]
[im 1/60]
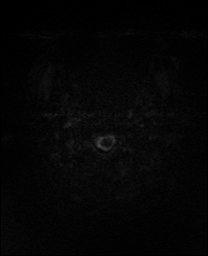
[im 15/60]
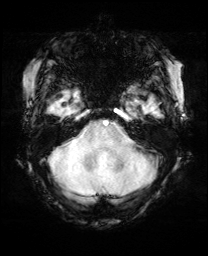
[im 30/60]
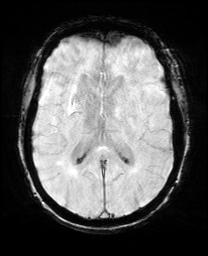
[im 45/60]
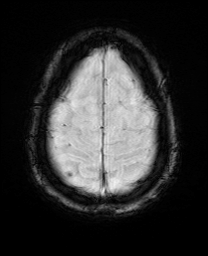
[im 60/60]
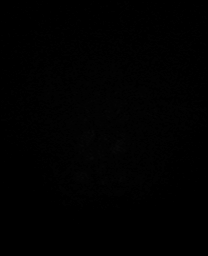

[Series 14: swi_images · axial · 3.0mm · 0.90mm/px · z∈[-72,+102]mm · 5 of 60 slices shown]
[im 1/60]
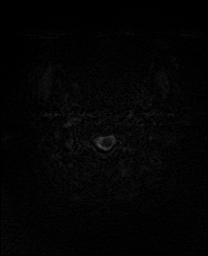
[im 15/60]
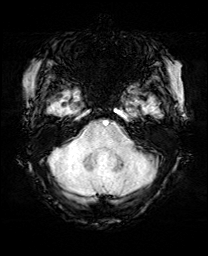
[im 30/60]
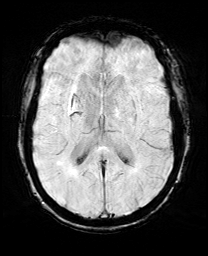
[im 45/60]
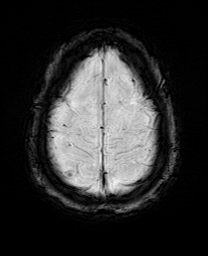
[im 60/60]
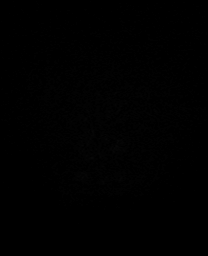

[Series 17: T2 post-contrast · coronal · 5.0mm · 0.72mm/px · 2 of 28 slices shown]
[im 1/28]
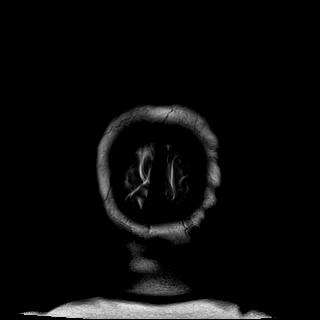
[im 28/28]
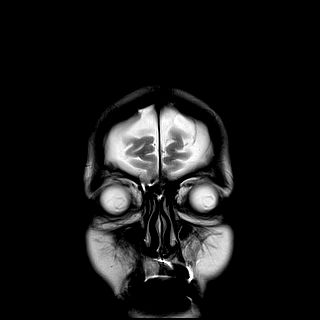

[Series 19: T1 post-contrast · coronal · 5.0mm · 0.34mm/px · 2 of 28 slices shown (1 of 2)]
[im 1/28]
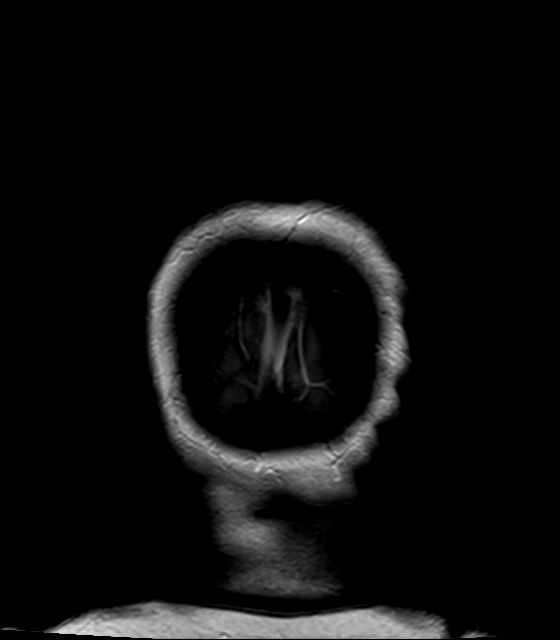
[im 28/28]
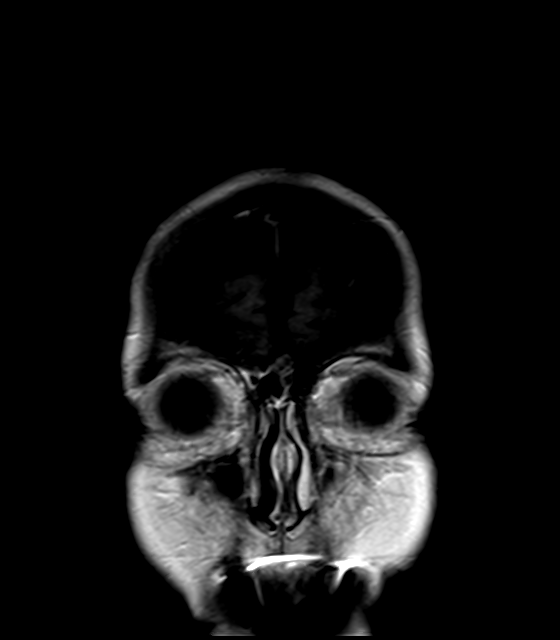

[Series 20: T1 post-contrast · sagittal · 5.0mm · 0.72mm/px · 2 of 23 slices shown (2 of 2)]
[im 1/23]
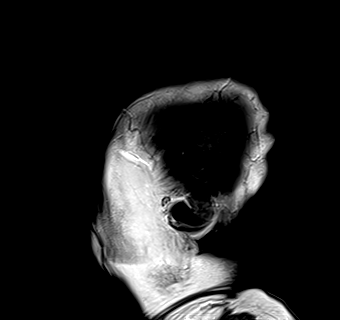
[im 23/23]
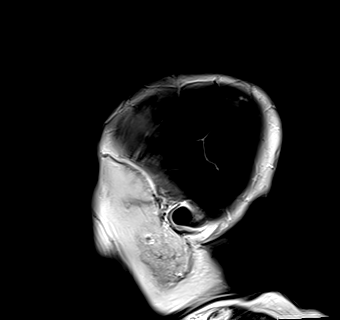

[38 of 48 positions shown; findings below may reference images not displayed]

FINDINGS: Brain: There is a small focus of acute ischemia within the left
caudate body. Old right basal ganglia small vessel infarct. No acute
or chronic hemorrhage. There is multifocal hyperintense T2-weighted
signal within the white matter. Generalized cerebral volume loss.
The midline structures are normal. There is no abnormal contrast
enhancement.

Vascular: Major flow voids are preserved.

Skull and upper cervical spine: Normal calvarium and skull base.
Visualized upper cervical spine and soft tissues are normal.

Sinuses/Orbits:No paranasal sinus fluid levels or advanced mucosal
thickening. No mastoid or middle ear effusion. Normal orbits.
IMPRESSION: 1. Small focus of acute ischemia within the left caudate body. No
hemorrhage or mass effect.
2. Old right basal ganglia small vessel infarct and findings of
chronic microvascular disease.

## 2024-03-30 DIAGNOSIS — I69911 Memory deficit following unspecified cerebrovascular disease: Secondary | ICD-10-CM | POA: Diagnosis not present

## 2024-03-30 DIAGNOSIS — I679 Cerebrovascular disease, unspecified: Secondary | ICD-10-CM | POA: Diagnosis not present

## 2024-03-30 DIAGNOSIS — M6281 Muscle weakness (generalized): Secondary | ICD-10-CM | POA: Diagnosis not present

## 2024-04-02 DIAGNOSIS — I69911 Memory deficit following unspecified cerebrovascular disease: Secondary | ICD-10-CM | POA: Diagnosis not present

## 2024-04-02 DIAGNOSIS — I679 Cerebrovascular disease, unspecified: Secondary | ICD-10-CM | POA: Diagnosis not present

## 2024-04-02 DIAGNOSIS — M6281 Muscle weakness (generalized): Secondary | ICD-10-CM | POA: Diagnosis not present

## 2024-04-05 DIAGNOSIS — I679 Cerebrovascular disease, unspecified: Secondary | ICD-10-CM | POA: Diagnosis not present

## 2024-04-05 DIAGNOSIS — I69911 Memory deficit following unspecified cerebrovascular disease: Secondary | ICD-10-CM | POA: Diagnosis not present

## 2024-04-05 DIAGNOSIS — M6281 Muscle weakness (generalized): Secondary | ICD-10-CM | POA: Diagnosis not present

## 2024-04-06 IMAGING — DX DG ABDOMEN 1V
1 series · 2 of 2 positions shown · non-contrast
Comparison: None Available.

CLINICAL DATA: Abdominal distension.

EXAM:
ABDOMEN - 1 VIEW

[Series 1: abdomen · 0.14mm/px · 2 of 2 slices shown]
[im 1/2]
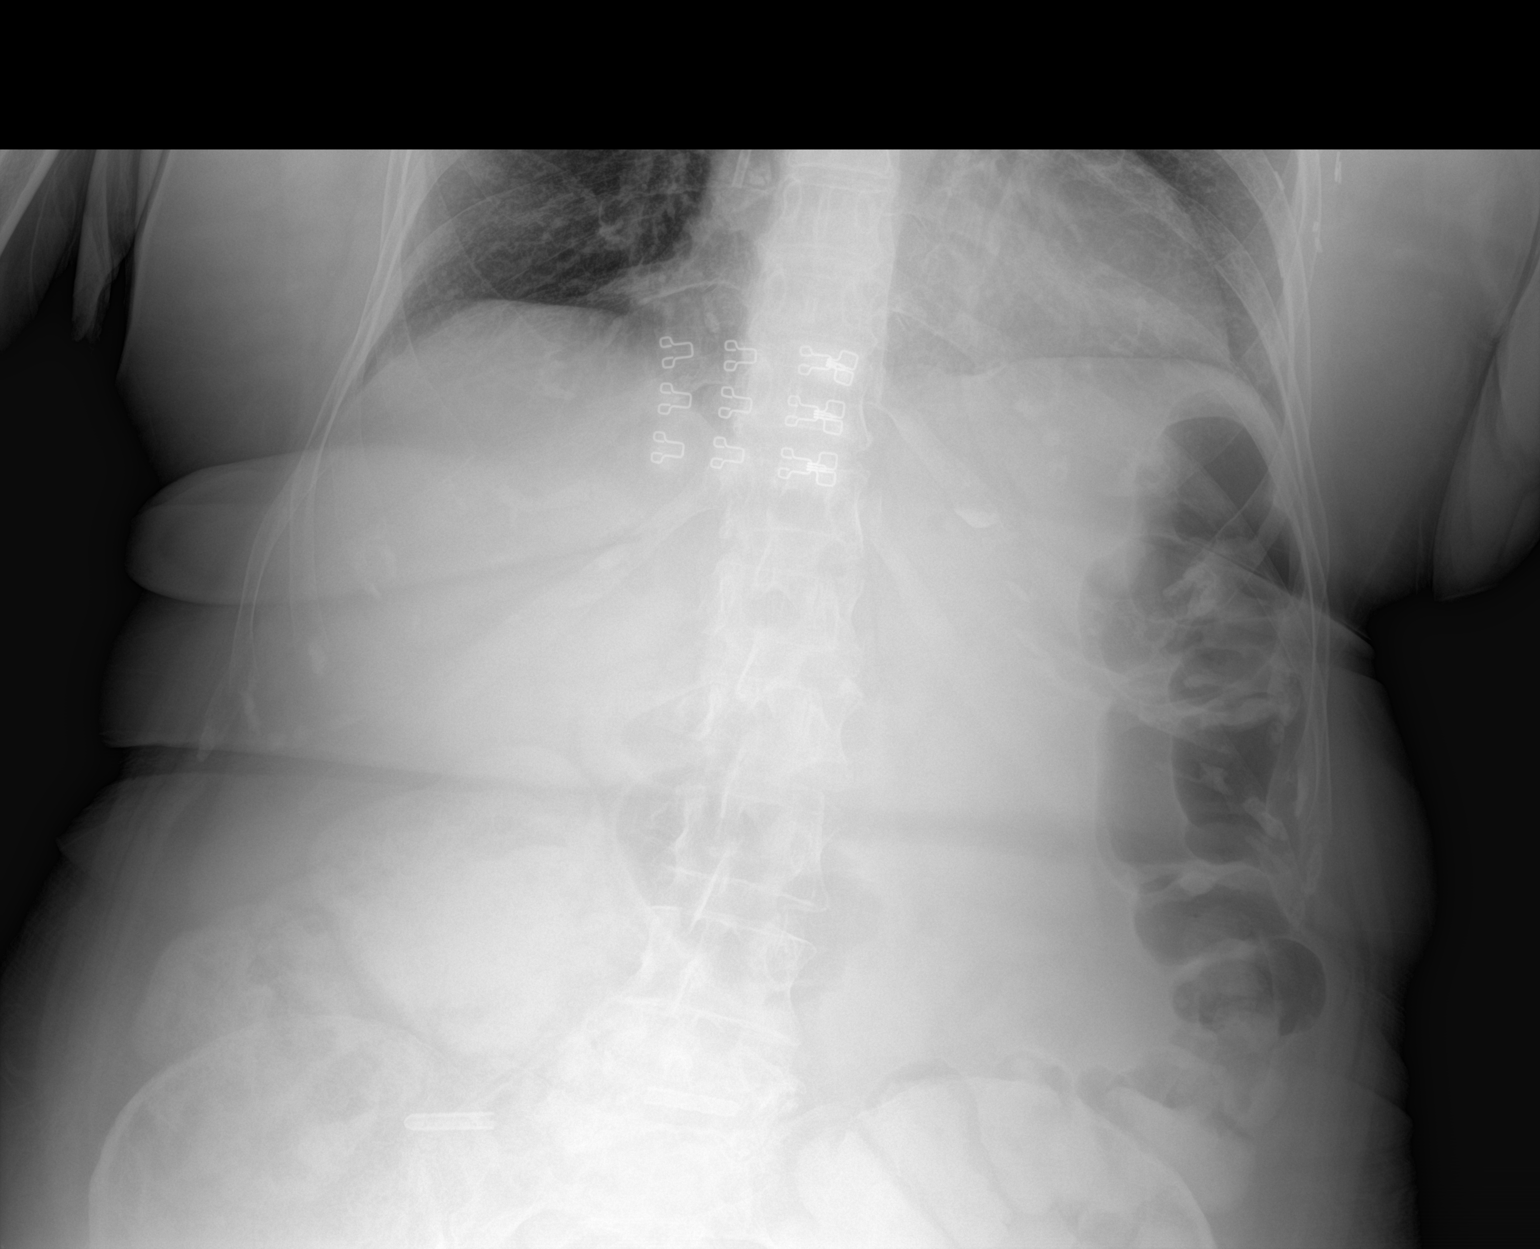
[im 2/2]
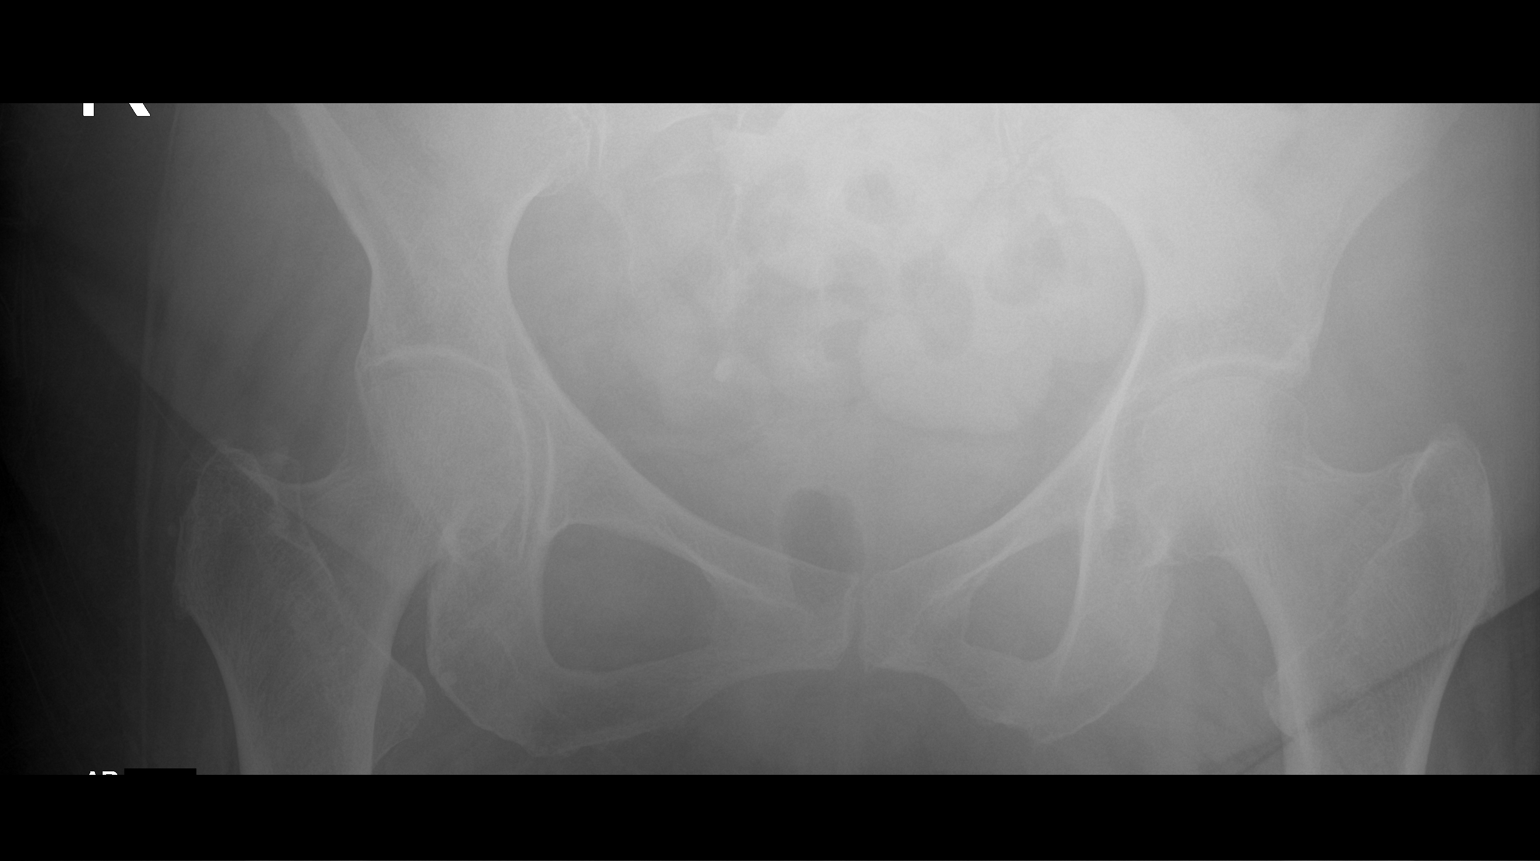

[2 of 2 positions shown; findings below may reference images not displayed]

FINDINGS: Normal bowel gas pattern. There is no bowel dilation to suggest
obstruction or significant adynamic ileus.

Soft tissues are poorly defined. Linear radiopaque object projects
over the right lower quadrant that may be superficial to this
patient. No convincing renal or ureteral stone.

No acute skeletal abnormality.
IMPRESSION: 1. No acute findings. No evidence of bowel obstruction or
significant adynamic ileus.

## 2024-04-07 ENCOUNTER — Encounter: Payer: Self-pay | Admitting: Orthopedic Surgery

## 2024-04-07 ENCOUNTER — Non-Acute Institutional Stay: Payer: Self-pay | Admitting: Orthopedic Surgery

## 2024-04-07 DIAGNOSIS — N390 Urinary tract infection, site not specified: Secondary | ICD-10-CM | POA: Diagnosis not present

## 2024-04-07 DIAGNOSIS — G4733 Obstructive sleep apnea (adult) (pediatric): Secondary | ICD-10-CM | POA: Diagnosis not present

## 2024-04-07 DIAGNOSIS — K649 Unspecified hemorrhoids: Secondary | ICD-10-CM | POA: Diagnosis not present

## 2024-04-07 DIAGNOSIS — R252 Cramp and spasm: Secondary | ICD-10-CM

## 2024-04-07 DIAGNOSIS — E039 Hypothyroidism, unspecified: Secondary | ICD-10-CM

## 2024-04-07 DIAGNOSIS — Z8673 Personal history of transient ischemic attack (TIA), and cerebral infarction without residual deficits: Secondary | ICD-10-CM

## 2024-04-07 DIAGNOSIS — I10 Essential (primary) hypertension: Secondary | ICD-10-CM

## 2024-04-07 DIAGNOSIS — D508 Other iron deficiency anemias: Secondary | ICD-10-CM | POA: Diagnosis not present

## 2024-04-07 DIAGNOSIS — Z171 Estrogen receptor negative status [ER-]: Secondary | ICD-10-CM | POA: Diagnosis not present

## 2024-04-07 DIAGNOSIS — C50212 Malignant neoplasm of upper-inner quadrant of left female breast: Secondary | ICD-10-CM

## 2024-04-07 DIAGNOSIS — K219 Gastro-esophageal reflux disease without esophagitis: Secondary | ICD-10-CM | POA: Diagnosis not present

## 2024-04-07 DIAGNOSIS — F339 Major depressive disorder, recurrent, unspecified: Secondary | ICD-10-CM

## 2024-04-07 MED ORDER — SENNA 8.6 MG PO TABS: 2.0000 | ORAL_TABLET | Freq: Every day | ORAL | Status: AC

## 2024-04-07 NOTE — Progress Notes (Signed)
 Location:  [facility] Room Number: 18/A Place of Service:  ALF [phone]) Provider:  Arnetha Bhat, NP   Marguerite Shiley, MD  Patient Care Team: Marguerite Shiley, MD as PCP - General (Internal Medicine)  Extended Emergency Contact Information Primary Emergency Contact: Tanya Fantasia, [place] Home Phone: [phone] Mobile Phone: [phone] Relation: Daughter Secondary Emergency Contact: Etta Heritage, [place] Home Phone: [phone] Mobile Phone: [phone] Relation: Daughter  Code Status:  DNR Goals of care: Advanced Directive information    12/01/2023    3:02 PM  Advanced Directives  Does Patient Have a Medical Advance Directive? Yes  Type of Estate agent of [place];Living will  Does patient want to make changes to medical advance directive? No - Patient declined  Copy of Healthcare Power of Attorney in Chart? No - copy requested  Would patient like information on creating a medical advance directive? No - Patient declined     Chief Complaint  Patient presents with   Medical Management of Chronic Issues    HPI:  Pt is a 83 y.o. female seen today for medical management of chronic diseases.    She currently resides on the assisted living unit at [facility]. PMH: HTN, HLD, asthma, OSA, GERD, hypothyroidism, acute encephalopathy (hospitalized 0821/2024), left thalamic infarction, osteopenia, depression, iron  deficiency anemia, and obesity.   Hemorrhoids-  increased pain with BM, remains on anusol  prn and colace HTN- BUN/creat 10/0.68 10/07/2023, remains on amlodipine , losartan  and HCTZ OSA- increased daytime sleepiness per nursing> napping more, noncompliant with CPAP H/o TIA/stroke- left thalamic infarct 03/2022,LDL 79 10/06/2023, remains on aspirin  and statin GERD- hgb 12.2 10/07/2023, remains on statin Hypothyroidism- TSH 0.86 10/07/2023,  remains on levothyroxine  Depression and anxiety- flat affect, odd behavior of taking pictures of other residents and some name calling> resolved with counseling, Wellbutrin  discontinued due to tremors, remains on Zyprexa  and Effexor  Left sided breast cancer- followed by oncology, h/o invasive ductal carcinoma of left breast, s/p mastectomy 2018 and chemo/radiation, continues scheduled port flushes Recurrent UTI- remains on keflex  daily Anemia- remains on ferrous sulfate  Muscle jerking- denies episodes per patient/nursing   No recent falls or injuries.   Recent blood pressures:  06/04- 118/68  05/28- 118/65  05/21- 128/74  Recent weights:  06/1- 188.2 lbs  05/01- 190.6 lbs  04/01- 191.6 lbs   Past Medical History:  Diagnosis Date   Allergy    Anemia    Anxiety    Asthma    in past, no inhalers now   Blood transfusion without reported diagnosis    Breast cancer (HCC) 01/2017   left breast    Breast cancer of upper-inner quadrant of left female breast (HCC) 03/11/2017   Cancer (HCC) 01/2017   left breast   Cataract    bilateral removed   Chronic kidney disease    kidney stones   Depression 08/31/2013   Dyslipidemia, goal LDL below 160 08/31/2013   Essential hypertension - well controlled 08/31/2013   Family history of breast cancer    Family history of melanoma    Family history of prostate cancer    GERD (gastroesophageal reflux disease)    Goals of care, counseling/discussion 03/11/2017   History of radiation therapy 10/13/17-11/11/17   left breast 40.05 Gy in 15 fractions, left breast boost 10 Gy in 5 fractions  Hypothyroidism    Ischemic stroke [facility])    May 2023;   Obesity (BMI 30-39.9) 08/31/2013   Personal history of chemotherapy 2018   Personal history of radiation therapy 2019   Thyroid  disease    Vasovagal near-syncope -rare 08/31/2013   Past Surgical History:  Procedure Laterality Date   ABDOMINAL HYSTERECTOMY     total   BREAST BIOPSY Left  02/24/2017    malignant   BREAST LUMPECTOMY Left 03/23/2017   broken fingers     CATARACT EXTRACTION Bilateral    COLONOSCOPY     COLONOSCOPY WITH PROPOFOL  N/A 06/03/2022   Procedure: COLONOSCOPY WITH PROPOFOL ;  Surgeon: Elois Hair, MD;  Location: [facility];  Service: Gastroenterology;  Laterality: N/A;   ESOPHAGOGASTRODUODENOSCOPY (EGD) WITH PROPOFOL  N/A 05/29/2022   Procedure: ESOPHAGOGASTRODUODENOSCOPY (EGD) WITH PROPOFOL ;  Surgeon: Nannette Babe, MD;  Location: [facility];  Service: Gastroenterology;  Laterality: N/A;   EXCISIONAL HEMORRHOIDECTOMY     IR FLUORO GUIDE PORT INSERTION RIGHT  08/04/2017   IR REMOVAL TUN ACCESS W/ PORT W/O FL MOD SED  08/04/2017   IR US  GUIDE VASC ACCESS RIGHT  08/04/2017   IRRIGATION AND DEBRIDEMENT ABSCESS Left 05/18/2017   Procedure: IRRIGATION AND DEBRIDEMENT LEFT AXILLARY ABSCESS;  Surgeon: Candyce Champagne, MD;  Location: [facility];  Service: General;  Laterality: Left;   kidney stones lithotripsy     POLYPECTOMY  06/03/2022   Procedure: POLYPECTOMY;  Surgeon: Elois Hair, MD;  Location: [facility];  Service: Gastroenterology;;   Fredricka Jenny PLACEMENT Right 03/23/2017   Procedure: INSERTION PORT-A-CATH;  Surgeon: Sim Dryer, MD;  Location: [facility];  Service: General;  Laterality: Right;   RADIOACTIVE SEED GUIDED PARTIAL MASTECTOMY WITH AXILLARY SENTINEL LYMPH NODE BIOPSY Left 03/23/2017   Procedure: LEFT BREAST RADIOACTIVE SEED GUIDED PARTIAL MASTECTOMY AND  SENTINEL LYMPH NODE MAPPING;  Surgeon: Sim Dryer, MD;  Location: [facility];  Service: General;  Laterality: Left;   ROTATOR CUFF REPAIR     left    TONSILLECTOMY     WISDOM TOOTH EXTRACTION      Allergies  Allergen Reactions   Benazepril  Anaphylaxis    Possible cause of tongue swelling   Penicillins Anaphylaxis    Has patient had a PCN reaction causing immediate rash, facial/tongue/throat swelling, SOB or lightheadedness with hypotension:  yes, Has patient had a PCN reaction causing severe rash involving mucus membranes or skin necrosis:  no, Has patient had a PCN reaction that required hospitalization:  no, Has patient had a PCN reaction occurring within the last 10 years: no, If all of the above answers are "NO", then may proceed with Cephalosporin use.   Advil [Ibuprofen] Itching and Rash    Rash that turns to blisters   Codeine Hives    hives   Crestor [Rosuvastatin Calcium ] Other (See Comments)    Pain in joints   Sulfamethoxazole-Trimethoprim Hives    hives   Bacitracin-Polymyxin B Dermatitis and Rash   Other Rash    Band-aid    Outpatient Encounter Medications as of 04/07/2024  Medication Sig   acetaminophen  (TYLENOL ) 500 MG tablet Take 500 mg by mouth every 6 (six) hours as needed.   amLODipine  (NORVASC ) 5 MG tablet Take 5 mg by mouth daily.   aspirin  81 MG chewable tablet Chew 1 tablet (81 mg total) by mouth daily.   atorvastatin  (LIPITOR ) 80 MG tablet Take 1 tablet (80 mg total) by mouth at bedtime.   cephALEXin  (KEFLEX ) 250 MG capsule Take 250  mg by mouth daily.   cholecalciferol  (VITAMIN D3) 25 MCG (1000 UNIT) tablet Take 1 tablet (1,000 Units total) by mouth daily.   ciclopirox  (PENLAC ) 8 % solution APPLY TOPICALLY TO AFFECTED NAIL(S) AT BEDTIME. APPLY OVER NAIL & SURROUNDING SKIN. APPLY DAILY OVER PREVIOUS COAT. **AFTER 7 DAYS, MAY REMOVE WITH ALCOHOL  AND CONTINUE CYCLE.**   Cranberry 500 MG CHEW Chew 1,000 mg by mouth daily.   docusate sodium  (COLACE) 100 MG capsule Take 100 mg by mouth 2 (two) times daily.   estradiol (ESTRACE) 0.1 MG/GM vaginal cream Place 1 Applicatorful vaginally daily. Every Tuesday and Friday   ferrous sulfate  325 (65 FE) MG tablet Take 1 tablet (325 mg total) by mouth every Monday, Wednesday, and Friday.   fexofenadine (ALLEGRA) 180 MG tablet Take 180 mg by mouth daily.   gabapentin  (NEURONTIN ) 100 MG capsule Take 1 capsule (100 mg total) by mouth at bedtime.   hydrochlorothiazide   (HYDRODIURIL ) 25 MG tablet Take 12.5 mg by mouth daily.   hydrocortisone  (ANUSOL -HC) 2.5 % rectal cream Apply 1 Application topically 2 (two) times daily.   L-Lysine 1000 MG TABS Take 1,000 mg by mouth as needed. As needed for fever blister   lactulose  (CHRONULAC ) 10 GM/15ML solution Take 45 mLs (30 g total) by mouth 2 (two) times daily as needed for mild constipation or moderate constipation.   levothyroxine  (SYNTHROID ) 75 MCG tablet Take 1 tablet (75 mcg total) by mouth daily.   lidocaine -prilocaine  (EMLA ) cream Apply 1 Application topically as needed.   losartan  (COZAAR ) 50 MG tablet Take 50 mg by mouth daily.   MAGNESIUM  GLYCINATE PO Take 1 each by mouth daily. gummy   Multiple Vitamins-Minerals (CENTRUM ADULTS MULTIGUMMIES PO) Take 2 each by mouth daily.   nystatin  powder Apply 1 Application topically 2 (two) times daily. 60 g to groin folds twice daily   OLANZapine  (ZYPREXA ) 2.5 MG tablet Take 2.5 mg by mouth daily.   omeprazole (PRILOSEC) 20 MG capsule Take 40 mg by mouth 2 (two) times daily before a meal.   potassium chloride  (KLOR-CON  M) 10 MEQ tablet Take 10 mEq by mouth once.   venlafaxine  XR (EFFEXOR -XR) 75 MG 24 hr capsule Take 3 capsules (225 mg total) by mouth daily.   Zinc Oxide 10 % OINT Apply 1 Application topically See admin instructions. Provide and apply to vaginal area 3 times daily as needed for itching,burning,moisture   No facility-administered encounter medications on file as of 04/07/2024.    Review of Systems  Constitutional:  Positive for fatigue. Negative for fever.  HENT:  Negative for sore throat and trouble swallowing.   Eyes:  Negative for visual disturbance.  Respiratory:  Negative for cough and shortness of breath.   Cardiovascular:  Negative for chest pain and leg swelling.  Gastrointestinal:  Positive for constipation and rectal pain. Negative for abdominal distention and abdominal pain.  Genitourinary:  Negative for dysuria, hematuria and vaginal  bleeding.  Musculoskeletal:  Positive for gait problem.  Skin:  Negative for wound.  Neurological:  Positive for weakness. Negative for dizziness, tremors and headaches.  Psychiatric/Behavioral:  Positive for confusion and dysphoric mood. Negative for sleep disturbance. The patient is not nervous/anxious.     Immunization History  Administered Date(s) Administered   DTaP 07/10/2022   Influenza Split 08/13/2017   Influenza, High Dose Seasonal PF 09/03/2015, 08/05/2016, 08/31/2018, 07/26/2019   Influenza, Quadrivalent, Recombinant, Inj, Pf 08/31/2018, 07/26/2019   Influenza,trivalent, recombinat, inj, PF 08/31/2018   Influenza-Unspecified 08/27/2012, 09/05/2013, 08/26/2016   PFIZER(Purple Top)SARS-COV-2  Vaccination 11/27/2019, 12/18/2019   PNEUMOCOCCAL CONJUGATE-20 02/05/2022   Pneumococcal Conjugate-13 10/05/2013, 07/26/2019   Pneumococcal Polysaccharide-23 03/25/2003   Td 03/25/2003   Tdap 03/25/2003   Zoster Recombinant(Shingrix) 12/27/2018, 07/26/2019   Pertinent  Health Maintenance Due  Topic Date Due   DEXA SCAN  Never done   INFLUENZA VACCINE  06/02/2024   Colonoscopy  06/03/2025      07/07/2022    8:10 PM 10/05/2022    2:09 PM 01/12/2023    2:11 PM 07/23/2023    2:56 PM 10/06/2023    4:20 PM  Fall Risk  Falls in the past year?  0 0 1 1  Was there an injury with Fall?  0 0 1 0  Fall Risk Category Calculator  0 0 3 1  Fall Risk Category (Retired)  Low     (RETIRED) Patient Fall Risk Level Low fall risk      Patient at Risk for Falls Due to    History of fall(s);Impaired balance/gait;Impaired mobility History of fall(s);Impaired balance/gait;Impaired mobility  Fall risk Follow up    Falls evaluation completed;Education provided;Falls prevention discussed Falls evaluation completed;Education provided;Falls prevention discussed   Functional Status Survey:    Vitals:   04/07/24 0944  BP: 118/68  Resp: 18  Temp: (!) 96.6 F (35.9 C)  SpO2: 96%  Weight: 188 lb 3.2 oz  (85.4 kg)  Height: 5' 1.5" (1.562 m)   Body mass index is 34.98 kg/m. Physical Exam Vitals reviewed.  Constitutional:      General: She is not in acute distress. HENT:     Head: Normocephalic.     Right Ear: There is no impacted cerumen.     Left Ear: There is no impacted cerumen.     Nose: Nose normal.     Mouth/Throat:     Mouth: Mucous membranes are moist.  Eyes:     General:        Right eye: No discharge.        Left eye: No discharge.  Cardiovascular:     Rate and Rhythm: Normal rate and regular rhythm.     Pulses: Normal pulses.     Heart sounds: Normal heart sounds.  Pulmonary:     Effort: Pulmonary effort is normal. No respiratory distress.     Breath sounds: Normal breath sounds. No wheezing or rales.  Abdominal:     General: Bowel sounds are normal. There is no distension.     Palpations: Abdomen is soft.     Tenderness: There is no abdominal tenderness.  Musculoskeletal:     Cervical back: Neck supple.     Right lower leg: Edema present.     Left lower leg: Edema present.     Comments: Non pitting  Skin:    General: Skin is warm.     Capillary Refill: Capillary refill takes less than 2 seconds.  Neurological:     General: No focal deficit present.     Mental Status: She is alert. Mental status is at baseline.     Motor: Weakness present.     Gait: Gait abnormal.     Comments: No tremors or muscle jerking  Psychiatric:        Mood and Affect: Affect is flat.     Labs reviewed: Recent Labs    06/23/23 1823 06/23/23 2023 06/24/23 1640 12/01/23 1420  NA 140 138 139 140  K 3.7 3.6 4.0 3.4*  CL 99  --  97* 102  CO2 32  --  28 30  GLUCOSE 91  --  149* 97  BUN 9  --  8 11  CREATININE 0.63  --  0.75 0.63  CALCIUM  9.7  --  9.5 9.5   Recent Labs    05/31/23 1430 06/23/23 1823 12/01/23 1420  AST 18 23 25   ALT 23 27 22   ALKPHOS 70 68 66  BILITOT 0.3 0.6 0.4  PROT 6.4* 7.0 6.3*  ALBUMIN 4.0 4.2 4.2   Recent Labs    05/31/23 1430  06/23/23 1823 06/24/23 1640 06/25/23 0553 12/01/23 1420  WBC 4.8   < > 5.0 4.9 6.1  NEUTROABS 3.1  --   --   --  4.2  HGB 12.3   < > 13.1 12.7 11.6*  HCT 37.4   < > 38.8 39.0 35.4*  MCV 102.7*   < > 99.0 101.0* 100.6*  PLT 225   < > 229 224 266   < > = values in this interval not displayed.   Lab Results  Component Value Date   TSH 0.567 06/23/2023   Lab Results  Component Value Date   HGBA1C 5.6 03/21/2022   Lab Results  Component Value Date   CHOL 297 (H) 03/21/2022   HDL 49 03/21/2022   LDLCALC 216 (H) 03/21/2022   TRIG 161 (H) 03/21/2022   CHOLHDL 6.1 03/21/2022    Significant Diagnostic Results in last 30 days:  No results found.  Assessment/Plan 1. Hemorrhoids, unspecified hemorrhoid type (Primary) - admits to hard stools, pain with BM - discontinue colace - start senna- 2 tablets at bedtime - start anusol  BID x 7 days, then BID prn  2. Essential hypertension - controlled with losartan   3. Obstructive sleep apnea - non compliant with CPAP  4. History of CVA (cerebrovascular accident) - LDL stable - cont asa and atorvastatin   5. Gastroesophageal reflux disease without esophagitis - hgb stable - cont omeprazole  6. Acquired hypothyroidism - TSH stable - cont levothyroxine   7. Recurrent depression (HCC) - flat affect> unchanged - odd behavior last month> resolved with counseling - unsuccessful trial of Wellbutrin  due to tremors - cont Effexor  and Zyprexa   8. Malignant neoplasm of upper-inner quadrant of left breast in female, estrogen receptor negative (HCC) - s/p mastectomy/ chemo/radiation 2018 - cont port flushes with oncology  9. Recurrent UTI - stable with Keflex   10. Iron  deficiency anemia secondary to inadequate dietary iron  intake - hgb stable with ferrous sulfate   11. Muscle cramps at night - ? Myoclonus per previous note - no recent episodes  - magnesium  started 06/06 - discontinue gabapentin  due to  polypharmacy     Family/ staff Communication: plan discussed with patient and nurse  Labs/tests ordered: cbc/diff, cmp 06/09

## 2024-04-10 DIAGNOSIS — D649 Anemia, unspecified: Secondary | ICD-10-CM | POA: Diagnosis not present

## 2024-04-10 DIAGNOSIS — L0390 Cellulitis, unspecified: Secondary | ICD-10-CM | POA: Diagnosis not present

## 2024-04-11 DIAGNOSIS — M6281 Muscle weakness (generalized): Secondary | ICD-10-CM | POA: Diagnosis not present

## 2024-04-11 DIAGNOSIS — I679 Cerebrovascular disease, unspecified: Secondary | ICD-10-CM | POA: Diagnosis not present

## 2024-04-11 DIAGNOSIS — I69911 Memory deficit following unspecified cerebrovascular disease: Secondary | ICD-10-CM | POA: Diagnosis not present

## 2024-04-11 LAB — COMPREHENSIVE METABOLIC PANEL WITH GFR
Albumin: 4.2 (ref 3.5–5.0)
Calcium: 9.5 (ref 8.7–10.7)
Globulin: 2.5

## 2024-04-11 LAB — BASIC METABOLIC PANEL WITH GFR
BUN: 10 (ref 4–21)
CO2: 27 — AB (ref 13–22)
Chloride: 102 (ref 99–108)
Creatinine: 0.7 (ref 0.5–1.1)
Glucose: 113
Potassium: 3.8 meq/L (ref 3.5–5.1)
Sodium: 141 (ref 137–147)

## 2024-04-11 LAB — CBC: RBC: 3.85 — AB (ref 3.87–5.11)

## 2024-04-11 LAB — CBC AND DIFFERENTIAL
HCT: 38 (ref 36–46)
Hemoglobin: 12.7 (ref 12.0–16.0)
Neutrophils Absolute: 4204
Platelets: 288 K/uL (ref 150–400)
WBC: 6.6

## 2024-04-11 LAB — HEPATIC FUNCTION PANEL
ALT: 21 U/L (ref 7–35)
AST: 22 (ref 13–35)
Alkaline Phosphatase: 74 (ref 25–125)
Bilirubin, Total: 0.4

## 2024-04-14 DIAGNOSIS — I69911 Memory deficit following unspecified cerebrovascular disease: Secondary | ICD-10-CM | POA: Diagnosis not present

## 2024-04-14 DIAGNOSIS — M6281 Muscle weakness (generalized): Secondary | ICD-10-CM | POA: Diagnosis not present

## 2024-04-14 DIAGNOSIS — I679 Cerebrovascular disease, unspecified: Secondary | ICD-10-CM | POA: Diagnosis not present

## 2024-04-17 DIAGNOSIS — I69911 Memory deficit following unspecified cerebrovascular disease: Secondary | ICD-10-CM | POA: Diagnosis not present

## 2024-04-17 DIAGNOSIS — M6281 Muscle weakness (generalized): Secondary | ICD-10-CM | POA: Diagnosis not present

## 2024-04-17 DIAGNOSIS — I679 Cerebrovascular disease, unspecified: Secondary | ICD-10-CM | POA: Diagnosis not present

## 2024-04-19 DIAGNOSIS — I679 Cerebrovascular disease, unspecified: Secondary | ICD-10-CM | POA: Diagnosis not present

## 2024-04-19 DIAGNOSIS — I69911 Memory deficit following unspecified cerebrovascular disease: Secondary | ICD-10-CM | POA: Diagnosis not present

## 2024-04-19 DIAGNOSIS — M6281 Muscle weakness (generalized): Secondary | ICD-10-CM | POA: Diagnosis not present

## 2024-04-20 DIAGNOSIS — M6281 Muscle weakness (generalized): Secondary | ICD-10-CM | POA: Diagnosis not present

## 2024-04-20 DIAGNOSIS — I679 Cerebrovascular disease, unspecified: Secondary | ICD-10-CM | POA: Diagnosis not present

## 2024-04-20 DIAGNOSIS — I69911 Memory deficit following unspecified cerebrovascular disease: Secondary | ICD-10-CM | POA: Diagnosis not present

## 2024-05-01 DIAGNOSIS — I679 Cerebrovascular disease, unspecified: Secondary | ICD-10-CM | POA: Diagnosis not present

## 2024-05-01 DIAGNOSIS — I69911 Memory deficit following unspecified cerebrovascular disease: Secondary | ICD-10-CM | POA: Diagnosis not present

## 2024-05-01 DIAGNOSIS — M6281 Muscle weakness (generalized): Secondary | ICD-10-CM | POA: Diagnosis not present

## 2024-05-02 DIAGNOSIS — M6281 Muscle weakness (generalized): Secondary | ICD-10-CM | POA: Diagnosis not present

## 2024-05-02 DIAGNOSIS — I69911 Memory deficit following unspecified cerebrovascular disease: Secondary | ICD-10-CM | POA: Diagnosis not present

## 2024-05-02 DIAGNOSIS — I679 Cerebrovascular disease, unspecified: Secondary | ICD-10-CM | POA: Diagnosis not present

## 2024-05-08 ENCOUNTER — Non-Acute Institutional Stay: Admitting: Orthopedic Surgery

## 2024-05-08 ENCOUNTER — Encounter: Payer: Self-pay | Admitting: Orthopedic Surgery

## 2024-05-08 DIAGNOSIS — R42 Dizziness and giddiness: Secondary | ICD-10-CM

## 2024-05-08 DIAGNOSIS — K649 Unspecified hemorrhoids: Secondary | ICD-10-CM

## 2024-05-08 NOTE — Progress Notes (Signed)
 Location:  [facility] Room Number: 18/A Place of Service:  ALF [phone]) Provider:  Greig FORBES Cluster, NP   Charlanne Fredia CROME, MD  Patient Care Team: Charlanne Fredia CROME, MD as PCP - General (Internal Medicine)  Extended Emergency Contact Information Primary Emergency Contact: Valora Darice M[place], [place] Home Phone: [phone] Mobile Phone: [phone] Relation: Daughter Secondary Emergency Contact: Rodgers Tamera M[place], [place] Home Phone: [phone] Mobile Phone: [phone] Relation: Daughter  Code Status:  DNR Goals of care: Advanced Directive information    12/01/2023    3:02 PM  Advanced Directives  Does Patient Have a Medical Advance Directive? Yes  Type of Estate agent of [place];Living will  Does patient want to make changes to medical advance directive? No - Patient declined  Copy of Healthcare Power of Attorney in Chart? No - copy requested  Would patient like information on creating a medical advance directive? No - Patient declined     Chief Complaint  Patient presents with   Acute Visit    dizziness    HPI:  Pt is a 83 y.o. female seen today for acute visit due to dizziness.   She currently resides on the assisted living unit at [facility]. PMH: HTN, HLD, asthma, OSA, GERD, hypothyroidism, acute encephalopathy (hospitalized 0821/2024), left thalamic infarction, osteopenia, depression, iron  deficiency anemia, and obesity.   07/07 she had episode of dizziness with nausea. She reports  room was spinning around me. She denies dizziness, nausea, vomiting at this time. She reports using anti dizziness medication in past. Recent vitals within normal range. Denies hematuria or bloody stools. She c/o increased hemorrhoid pain. She is asking for Anusol  to be scheduled for next few days. Admits to not drinking water  well.   Recent lab work 06/09: BUN/creat  10/0.70, hgb 12.7 04/10/2024  Past Medical History:  Diagnosis Date   Allergy    Anemia    Anxiety    Asthma    in past, no inhalers now   Blood transfusion without reported diagnosis    Breast cancer (HCC) 01/2017   left breast    Breast cancer of upper-inner quadrant of left female breast (HCC) 03/11/2017   Cancer (HCC) 01/2017   left breast   Cataract    bilateral removed   Chronic kidney disease    kidney stones   Depression 08/31/2013   Dyslipidemia, goal LDL below 160 08/31/2013   Essential hypertension - well controlled 08/31/2013   Family history of breast cancer    Family history of melanoma    Family history of prostate cancer    GERD (gastroesophageal reflux disease)    Goals of care, counseling/discussion 03/11/2017   History of radiation therapy 10/13/17-11/11/17   left breast 40.05 Gy in 15 fractions, left breast boost 10 Gy in 5 fractions   Hypothyroidism    Ischemic stroke [facility])    May 2023;   Obesity (BMI 30-39.9) 08/31/2013   Personal history of chemotherapy 2018   Personal history of radiation therapy 2019   Thyroid  disease    Vasovagal near-syncope -rare 08/31/2013   Past Surgical History:  Procedure Laterality Date   ABDOMINAL HYSTERECTOMY     total   BREAST BIOPSY Left 02/24/2017    malignant   BREAST LUMPECTOMY Left 03/23/2017   broken fingers     CATARACT EXTRACTION Bilateral  COLONOSCOPY     COLONOSCOPY WITH PROPOFOL  N/A 06/03/2022   Procedure: COLONOSCOPY WITH PROPOFOL ;  Surgeon: Stacia Glendia BRAVO, MD;  Location: [facility];  Service: Gastroenterology;  Laterality: N/A;   ESOPHAGOGASTRODUODENOSCOPY (EGD) WITH PROPOFOL  N/A 05/29/2022   Procedure: ESOPHAGOGASTRODUODENOSCOPY (EGD) WITH PROPOFOL ;  Surgeon: Albertus Gordy HERO, MD;  Location: [facility];  Service: Gastroenterology;  Laterality: N/A;   EXCISIONAL HEMORRHOIDECTOMY     IR FLUORO GUIDE PORT INSERTION RIGHT  08/04/2017   IR REMOVAL TUN ACCESS W/ PORT W/O FL MOD SED  08/04/2017   IR  US  GUIDE VASC ACCESS RIGHT  08/04/2017   IRRIGATION AND DEBRIDEMENT ABSCESS Left 05/18/2017   Procedure: IRRIGATION AND DEBRIDEMENT LEFT AXILLARY ABSCESS;  Surgeon: Sheldon Standing, MD;  Location: [facility];  Service: General;  Laterality: Left;   kidney stones lithotripsy     POLYPECTOMY  06/03/2022   Procedure: POLYPECTOMY;  Surgeon: Stacia Glendia BRAVO, MD;  Location: [facility];  Service: Gastroenterology;;   TY PLACEMENT Right 03/23/2017   Procedure: INSERTION PORT-A-CATH;  Surgeon: Vanderbilt Ned, MD;  Location: [facility];  Service: General;  Laterality: Right;   RADIOACTIVE SEED GUIDED PARTIAL MASTECTOMY WITH AXILLARY SENTINEL LYMPH NODE BIOPSY Left 03/23/2017   Procedure: LEFT BREAST RADIOACTIVE SEED GUIDED PARTIAL MASTECTOMY AND  SENTINEL LYMPH NODE MAPPING;  Surgeon: Vanderbilt Ned, MD;  Location: [facility];  Service: General;  Laterality: Left;   ROTATOR CUFF REPAIR     left    TONSILLECTOMY     WISDOM TOOTH EXTRACTION      Allergies  Allergen Reactions   Benazepril  Anaphylaxis    Possible cause of tongue swelling   Penicillins Anaphylaxis    Has patient had a PCN reaction causing immediate rash, facial/tongue/throat swelling, SOB or lightheadedness with hypotension: yes, Has patient had a PCN reaction causing severe rash involving mucus membranes or skin necrosis:  no, Has patient had a PCN reaction that required hospitalization:  no, Has patient had a PCN reaction occurring within the last 10 years: no, If all of the above answers are NO, then may proceed with Cephalosporin use.   Advil [Ibuprofen] Itching and Rash    Rash that turns to blisters   Codeine Hives    hives   Crestor [Rosuvastatin Calcium ] Other (See Comments)    Pain in joints   Sulfamethoxazole-Trimethoprim Hives    hives   Bacitracin-Polymyxin B Dermatitis and Rash   Other Rash    Band-aid    Outpatient Encounter Medications as of 05/08/2024  Medication Sig    acetaminophen  (TYLENOL ) 500 MG tablet Take 500 mg by mouth every 6 (six) hours as needed.   amLODipine  (NORVASC ) 5 MG tablet Take 5 mg by mouth daily.   aspirin  81 MG chewable tablet Chew 1 tablet (81 mg total) by mouth daily.   atorvastatin  (LIPITOR ) 80 MG tablet Take 1 tablet (80 mg total) by mouth at bedtime.   cephALEXin  (KEFLEX ) 250 MG capsule Take 250 mg by mouth daily.   cholecalciferol  (VITAMIN D3) 25 MCG (1000 UNIT) tablet Take 1 tablet (1,000 Units total) by mouth daily.   ciclopirox  (PENLAC ) 8 % solution APPLY TOPICALLY TO AFFECTED NAIL(S) AT BEDTIME. APPLY OVER NAIL & SURROUNDING SKIN. APPLY DAILY OVER PREVIOUS COAT. **AFTER 7 DAYS, MAY REMOVE WITH ALCOHOL  AND CONTINUE CYCLE.**   Cranberry 500 MG CHEW Chew 1,000 mg by mouth daily.   estradiol (ESTRACE) 0.1 MG/GM vaginal cream Place 1 Applicatorful vaginally daily. Every Tuesday and Friday   ferrous sulfate  325 (  65 FE) MG tablet Take 1 tablet (325 mg total) by mouth every Monday, Wednesday, and Friday.   fexofenadine (ALLEGRA) 180 MG tablet Take 180 mg by mouth daily.   hydrochlorothiazide  (HYDRODIURIL ) 25 MG tablet Take 12.5 mg by mouth daily.   hydrocortisone  (ANUSOL -HC) 2.5 % rectal cream Apply 1 Application topically 2 (two) times daily.   L-Lysine 1000 MG TABS Take 1,000 mg by mouth as needed. As needed for fever blister   lactulose  (CHRONULAC ) 10 GM/15ML solution Take 45 mLs (30 g total) by mouth 2 (two) times daily as needed for mild constipation or moderate constipation.   levothyroxine  (SYNTHROID ) 75 MCG tablet Take 1 tablet (75 mcg total) by mouth daily.   lidocaine -prilocaine  (EMLA ) cream Apply 1 Application topically as needed.   losartan  (COZAAR ) 50 MG tablet Take 50 mg by mouth daily.   Magnesium  200 MG TABS Take 200 mg by mouth daily.   Multiple Vitamins-Minerals (CENTRUM ADULTS MULTIGUMMIES PO) Take 2 each by mouth daily.   nystatin  powder Apply 1 Application topically 2 (two) times daily. 60 g to groin folds twice  daily   OLANZapine  (ZYPREXA ) 2.5 MG tablet Take 2.5 mg by mouth daily.   omeprazole (PRILOSEC) 20 MG capsule Take 40 mg by mouth 2 (two) times daily before a meal.   potassium chloride  (KLOR-CON  M) 10 MEQ tablet Take 10 mEq by mouth once.   senna (SENOKOT) 8.6 MG TABS tablet Take 2 tablets (17.2 mg total) by mouth at bedtime.   venlafaxine  XR (EFFEXOR -XR) 75 MG 24 hr capsule Take 3 capsules (225 mg total) by mouth daily.   Zinc Oxide 10 % OINT Apply 1 Application topically See admin instructions. Provide and apply to vaginal area 3 times daily as needed for itching,burning,moisture   No facility-administered encounter medications on file as of 05/08/2024.    Review of Systems  Constitutional:  Negative for fatigue and fever.  Respiratory:  Negative for cough and shortness of breath.   Cardiovascular:  Negative for chest pain and leg swelling.  Gastrointestinal:  Positive for nausea and rectal pain. Negative for abdominal distention, abdominal pain, anal bleeding, blood in stool, constipation and vomiting.  Genitourinary:  Negative for hematuria and vaginal bleeding.  Musculoskeletal:  Positive for gait problem.  Neurological:  Positive for dizziness and weakness. Negative for headaches.  Psychiatric/Behavioral:  Negative for dysphoric mood. The patient is not nervous/anxious.     Immunization History  Administered Date(s) Administered   DTaP 07/10/2022   Influenza Split 08/13/2017   Influenza, High Dose Seasonal PF 09/03/2015, 08/05/2016, 08/31/2018, 07/26/2019   Influenza, Quadrivalent, Recombinant, Inj, Pf 08/31/2018, 07/26/2019   Influenza,trivalent, recombinat, inj, PF 08/31/2018   Influenza-Unspecified 08/27/2012, 09/05/2013, 08/26/2016   PFIZER(Purple Top)SARS-COV-2 Vaccination 11/27/2019, 12/18/2019   PNEUMOCOCCAL CONJUGATE-20 02/05/2022   Pneumococcal Conjugate-13 10/05/2013, 07/26/2019   Pneumococcal Polysaccharide-23 03/25/2003   Td 03/25/2003   Tdap 03/25/2003   Zoster  Recombinant(Shingrix) 12/27/2018, 07/26/2019   Pertinent  Health Maintenance Due  Topic Date Due   DEXA SCAN  Never done   INFLUENZA VACCINE  06/02/2024   Colonoscopy  06/03/2025      10/05/2022    2:09 PM 01/12/2023    2:11 PM 07/23/2023    2:56 PM 10/06/2023    4:20 PM 04/07/2024    3:55 PM  Fall Risk  Falls in the past year? 0 0 1 1 0  Was there an injury with Fall? 0 0 1 0 0  Fall Risk Category Calculator 0 0 3 1 0  Fall Risk Category (Retired) Low       Patient at Risk for Falls Due to   History of fall(s);Impaired balance/gait;Impaired mobility History of fall(s);Impaired balance/gait;Impaired mobility History of fall(s)  Fall risk Follow up   Falls evaluation completed;Education provided;Falls prevention discussed Falls evaluation completed;Education provided;Falls prevention discussed Falls evaluation completed;Education provided     Data saved with a previous flowsheet row definition   Functional Status Survey:    Vitals:   05/08/24 1127  BP: 124/72  Pulse: 64  Resp: 18  Temp: (!) 97.5 F (36.4 C)  SpO2: 93%  Weight: 190 lb 12.8 oz (86.5 kg)  Height: 5' 1.5 (1.562 m)   Body mass index is 35.47 kg/m. Physical Exam Vitals reviewed.  Constitutional:      General: She is not in acute distress. HENT:     Head: Normocephalic.  Eyes:     General:        Right eye: No discharge.        Left eye: No discharge.     Extraocular Movements:     Right eye: Normal extraocular motion and no nystagmus.     Left eye: Normal extraocular motion and no nystagmus.  Cardiovascular:     Rate and Rhythm: Normal rate and regular rhythm.     Pulses: Normal pulses.     Heart sounds: Normal heart sounds.  Pulmonary:     Effort: Pulmonary effort is normal.     Breath sounds: Normal breath sounds.  Abdominal:     General: Bowel sounds are normal. There is no distension.     Palpations: Abdomen is soft.     Tenderness: There is no abdominal tenderness.  Musculoskeletal:      Cervical back: Neck supple.     Right lower leg: No edema.     Left lower leg: No edema.  Skin:    General: Skin is warm.     Capillary Refill: Capillary refill takes less than 2 seconds.  Neurological:     General: No focal deficit present.     Mental Status: She is alert.  Psychiatric:        Mood and Affect: Mood normal.     Labs reviewed: Recent Labs    06/23/23 1823 06/23/23 2023 06/24/23 1640 12/01/23 1420  NA 140 138 139 140  K 3.7 3.6 4.0 3.4*  CL 99  --  97* 102  CO2 32  --  28 30  GLUCOSE 91  --  149* 97  BUN 9  --  8 11  CREATININE 0.63  --  0.75 0.63  CALCIUM  9.7  --  9.5 9.5   Recent Labs    05/31/23 1430 06/23/23 1823 12/01/23 1420  AST 18 23 25   ALT 23 27 22   ALKPHOS 70 68 66  BILITOT 0.3 0.6 0.4  PROT 6.4* 7.0 6.3*  ALBUMIN 4.0 4.2 4.2   Recent Labs    05/31/23 1430 06/23/23 1823 06/24/23 1640 06/25/23 0553 12/01/23 1420  WBC 4.8   < > 5.0 4.9 6.1  NEUTROABS 3.1  --   --   --  4.2  HGB 12.3   < > 13.1 12.7 11.6*  HCT 37.4   < > 38.8 39.0 35.4*  MCV 102.7*   < > 99.0 101.0* 100.6*  PLT 225   < > 229 224 266   < > = values in this interval not displayed.   Lab Results  Component Value Date   TSH 0.567 06/23/2023  Lab Results  Component Value Date   HGBA1C 5.6 03/21/2022   Lab Results  Component Value Date   CHOL 297 (H) 03/21/2022   HDL 49 03/21/2022   LDLCALC 216 (H) 03/21/2022   TRIG 161 (H) 03/21/2022   CHOLHDL 6.1 03/21/2022    Significant Diagnostic Results in last 30 days:  No results found.  Assessment/Plan 1. Dizziness (Primary) - one episode 07/07 - appears to have resolved on own - exam unremarkable - recent lab work no sign of anemia or dehydration - orthostatic blood pressures x 1 set> if abnormal recommend cbc/diff - encourage hydration with water   2. Hemorrhoids, unspecified hemorrhoid type - ongoing - forgets to ask for anusol  prn - start anusol  BID x 5 days, then BID PRN - cont  senna    Family/ staff Communication: plan discussed with patient and nurse  Labs/tests ordered:  orthostatic blood pressures x 1 set

## 2024-05-08 NOTE — Addendum Note (Signed)
 Addended by: Kaleigha Chamberlin E on: 05/08/2024 01:10 PM   Modules accepted: Orders

## 2024-05-09 DIAGNOSIS — I69911 Memory deficit following unspecified cerebrovascular disease: Secondary | ICD-10-CM | POA: Diagnosis not present

## 2024-05-09 DIAGNOSIS — I679 Cerebrovascular disease, unspecified: Secondary | ICD-10-CM | POA: Diagnosis not present

## 2024-05-09 DIAGNOSIS — M6281 Muscle weakness (generalized): Secondary | ICD-10-CM | POA: Diagnosis not present

## 2024-05-10 DIAGNOSIS — M6281 Muscle weakness (generalized): Secondary | ICD-10-CM | POA: Diagnosis not present

## 2024-05-10 DIAGNOSIS — I69911 Memory deficit following unspecified cerebrovascular disease: Secondary | ICD-10-CM | POA: Diagnosis not present

## 2024-05-10 DIAGNOSIS — I679 Cerebrovascular disease, unspecified: Secondary | ICD-10-CM | POA: Diagnosis not present

## 2024-05-16 DIAGNOSIS — M6281 Muscle weakness (generalized): Secondary | ICD-10-CM | POA: Diagnosis not present

## 2024-05-16 DIAGNOSIS — I69911 Memory deficit following unspecified cerebrovascular disease: Secondary | ICD-10-CM | POA: Diagnosis not present

## 2024-05-16 DIAGNOSIS — I679 Cerebrovascular disease, unspecified: Secondary | ICD-10-CM | POA: Diagnosis not present

## 2024-05-18 DIAGNOSIS — I69911 Memory deficit following unspecified cerebrovascular disease: Secondary | ICD-10-CM | POA: Diagnosis not present

## 2024-05-18 DIAGNOSIS — I679 Cerebrovascular disease, unspecified: Secondary | ICD-10-CM | POA: Diagnosis not present

## 2024-05-18 DIAGNOSIS — M6281 Muscle weakness (generalized): Secondary | ICD-10-CM | POA: Diagnosis not present

## 2024-05-22 ENCOUNTER — Other Ambulatory Visit: Payer: Self-pay | Admitting: *Deleted

## 2024-05-22 DIAGNOSIS — C50212 Malignant neoplasm of upper-inner quadrant of left female breast: Secondary | ICD-10-CM

## 2024-05-23 ENCOUNTER — Inpatient Hospital Stay: Payer: Medicare Other | Attending: Hematology & Oncology

## 2024-05-23 ENCOUNTER — Other Ambulatory Visit: Payer: Self-pay

## 2024-05-23 ENCOUNTER — Encounter: Payer: Self-pay | Admitting: Hematology & Oncology

## 2024-05-23 ENCOUNTER — Ambulatory Visit: Payer: Self-pay | Admitting: Hematology & Oncology

## 2024-05-23 ENCOUNTER — Inpatient Hospital Stay: Payer: Medicare Other

## 2024-05-23 ENCOUNTER — Inpatient Hospital Stay: Payer: Medicare Other | Admitting: Hematology & Oncology

## 2024-05-23 VITALS — BP 126/55 | HR 77 | Temp 98.0°F | Resp 20 | Ht 61.5 in | Wt 187.0 lb

## 2024-05-23 DIAGNOSIS — M79671 Pain in right foot: Secondary | ICD-10-CM | POA: Diagnosis not present

## 2024-05-23 DIAGNOSIS — F0390 Unspecified dementia without behavioral disturbance: Secondary | ICD-10-CM | POA: Insufficient documentation

## 2024-05-23 DIAGNOSIS — I69911 Memory deficit following unspecified cerebrovascular disease: Secondary | ICD-10-CM | POA: Diagnosis not present

## 2024-05-23 DIAGNOSIS — I679 Cerebrovascular disease, unspecified: Secondary | ICD-10-CM | POA: Diagnosis not present

## 2024-05-23 DIAGNOSIS — Z923 Personal history of irradiation: Secondary | ICD-10-CM | POA: Diagnosis not present

## 2024-05-23 DIAGNOSIS — Z9221 Personal history of antineoplastic chemotherapy: Secondary | ICD-10-CM | POA: Diagnosis not present

## 2024-05-23 DIAGNOSIS — Z171 Estrogen receptor negative status [ER-]: Secondary | ICD-10-CM | POA: Diagnosis not present

## 2024-05-23 DIAGNOSIS — Z8639 Personal history of other endocrine, nutritional and metabolic disease: Secondary | ICD-10-CM | POA: Diagnosis not present

## 2024-05-23 DIAGNOSIS — B351 Tinea unguium: Secondary | ICD-10-CM | POA: Diagnosis not present

## 2024-05-23 DIAGNOSIS — C50212 Malignant neoplasm of upper-inner quadrant of left female breast: Secondary | ICD-10-CM

## 2024-05-23 DIAGNOSIS — Z853 Personal history of malignant neoplasm of breast: Secondary | ICD-10-CM | POA: Diagnosis not present

## 2024-05-23 DIAGNOSIS — M6281 Muscle weakness (generalized): Secondary | ICD-10-CM | POA: Diagnosis not present

## 2024-05-23 DIAGNOSIS — M79672 Pain in left foot: Secondary | ICD-10-CM | POA: Diagnosis not present

## 2024-05-23 LAB — FERRITIN: Ferritin: 55 ng/mL (ref 11–307)

## 2024-05-23 LAB — CBC WITH DIFFERENTIAL (CANCER CENTER ONLY)
Abs Immature Granulocytes: 0.02 K/uL (ref 0.00–0.07)
Basophils Absolute: 0 K/uL (ref 0.0–0.1)
Basophils Relative: 0 %
Eosinophils Absolute: 0.3 K/uL (ref 0.0–0.5)
Eosinophils Relative: 5 %
HCT: 37.7 % (ref 36.0–46.0)
Hemoglobin: 12.2 g/dL (ref 12.0–15.0)
Immature Granulocytes: 0 %
Lymphocytes Relative: 28 %
Lymphs Abs: 1.7 K/uL (ref 0.7–4.0)
MCH: 32.4 pg (ref 26.0–34.0)
MCHC: 32.4 g/dL (ref 30.0–36.0)
MCV: 100 fL (ref 80.0–100.0)
Monocytes Absolute: 0.3 K/uL (ref 0.1–1.0)
Monocytes Relative: 6 %
Neutro Abs: 3.6 K/uL (ref 1.7–7.7)
Neutrophils Relative %: 61 %
Platelet Count: 248 K/uL (ref 150–400)
RBC: 3.77 MIL/uL — ABNORMAL LOW (ref 3.87–5.11)
RDW: 12.9 % (ref 11.5–15.5)
WBC Count: 5.9 K/uL (ref 4.0–10.5)
nRBC: 0 % (ref 0.0–0.2)

## 2024-05-23 LAB — CMP (CANCER CENTER ONLY)
ALT: 22 U/L (ref 0–44)
AST: 25 U/L (ref 15–41)
Albumin: 4.1 g/dL (ref 3.5–5.0)
Alkaline Phosphatase: 85 U/L (ref 38–126)
Anion gap: 11 (ref 5–15)
BUN: 8 mg/dL (ref 8–23)
CO2: 28 mmol/L (ref 22–32)
Calcium: 9.4 mg/dL (ref 8.9–10.3)
Chloride: 101 mmol/L (ref 98–111)
Creatinine: 0.81 mg/dL (ref 0.44–1.00)
GFR, Estimated: >60 mL/min (ref >60)
Glucose, Bld: 127 mg/dL — ABNORMAL HIGH (ref 70–99)
Potassium: 3.5 mmol/L (ref 3.5–5.1)
Sodium: 140 mmol/L (ref 135–145)
Total Bilirubin: 0.3 mg/dL (ref 0.0–1.2)
Total Protein: 6.8 g/dL (ref 6.5–8.1)

## 2024-05-23 LAB — IRON AND IRON BINDING CAPACITY (CC-WL,HP ONLY)
Iron: 57 ug/dL (ref 28–170)
Saturation Ratios: 17 % (ref 10.4–31.8)
TIBC: 344 ug/dL (ref 250–450)
UIBC: 287 ug/dL

## 2024-05-23 MED ORDER — ALTEPLASE 2 MG IJ SOLR
2.0000 mg | Freq: Once | INTRAMUSCULAR | Status: DC | PRN
Start: 2024-05-23 — End: 2024-05-23

## 2024-05-23 MED ORDER — HEPARIN SOD (PORK) LOCK FLUSH 100 UNIT/ML IV SOLN
500.0000 [IU] | Freq: Once | INTRAVENOUS | Status: AC | PRN
Start: 2024-05-23 — End: 2024-05-23
  Administered 2024-05-23: 500 [IU]

## 2024-05-23 MED ORDER — SODIUM CHLORIDE 0.9% FLUSH
10.0000 mL | INTRAVENOUS | Status: DC | PRN
  Administered 2024-05-23: 10 mL

## 2024-05-23 NOTE — Progress Notes (Signed)
 Hematology and Oncology Follow Up Visit  Jessica [name] [id number] 19-Sep-1941 83 y.o. 05/23/2024   Principle Diagnosis:  Stage IIA ([id number]) invasive ductal ca of the LEFT breast - TRIPLE NEGATIVE Staphylococcus aureus wound infection of the LEFT axilla Cellulitis of the left breast  Past Therapy:   Taxotere /Carboplatin  - s/p cycle #4 -completed adjuvant therapy on 09/09/2017 Radiation therapy to the right breast.-Completed 4005 rad with an additional 1000 rad boost on 11/11/2017  Current Therapy: Observation   Interim History:  Jessica [name] is here today for follow-up.  I think this probably her last visit.  She is doing okay.  She has a [facility].  She is doing okay there.  She plays a lot of bingo.  She otherwise is doing all right.  She has dementia.  I think this is going to be the main issue.  I really do not think that breast cancer can be a problem for her.  I know that it can sometimes be difficult for her to get to the office.  As such, I really think that we can probably have this be at the last visit.  Will have to see about getting her Port-A-Cath taken out.  She has had a decent appetite.  She has had no nausea or vomiting.  There is been no change in bowel or bladder habits.  She has had no cough or shortness of breath.  She has had no bleeding.  Overall, I would say her performance status is probably ECOG 3.   Medications:  Allergies as of 05/23/2024       Reactions   Benazepril  Anaphylaxis   Possible cause of tongue swelling   Penicillins Anaphylaxis   Has patient had a PCN reaction causing immediate rash, facial/tongue/throat swelling, SOB or lightheadedness with hypotension: yes, Has patient had a PCN reaction causing severe rash involving mucus membranes or skin necrosis:  no, Has patient had a PCN reaction that required hospitalization:  no, Has patient had a PCN reaction occurring within the last 10 years: no, If all of the above answers are  NO, then may proceed with Cephalosporin use.   Advil [ibuprofen] Itching, Rash   Rash that turns to blisters   Codeine Hives   hives   Crestor [rosuvastatin Calcium ] Other (See Comments)   Pain in joints   Sulfamethoxazole-trimethoprim Hives   hives   Bacitracin-polymyxin B Dermatitis, Rash   Other Rash   Band-aid        Medication List        Accurate as of May 23, 2024  1:55 PM. If you have any questions, ask your nurse or doctor.          STOP taking these medications    estradiol 0.1 MG/GM vaginal cream Commonly known as: ESTRACE Stopped by: Yumna Ebers R Trella Thurmond   OLANZapine  2.5 MG tablet Commonly known as: ZYPREXA  Stopped by: Madlynn Lundeen R Arthelia Callicott   Zinc Oxide 10 % Oint Stopped by: Sulayman Manning R Ronon Ferger       TAKE these medications    acetaminophen  500 MG tablet Commonly known as: TYLENOL  Take 500 mg by mouth every 6 (six) hours as needed.   amLODipine  5 MG tablet Commonly known as: NORVASC  Take 5 mg by mouth daily.   aspirin  81 MG chewable tablet Chew 1 tablet (81 mg total) by mouth daily.   atorvastatin  80 MG tablet Commonly known as: LIPITOR  Take 1 tablet (80 mg total) by mouth at bedtime.   CENTRUM ADULTS MULTIGUMMIES PO  Take 2 each by mouth daily.   cephALEXin  250 MG capsule Commonly known as: KEFLEX  Take 250 mg by mouth daily.   cholecalciferol  25 MCG (1000 UNIT) tablet Commonly known as: VITAMIN D3 Take 1 tablet (1,000 Units total) by mouth daily.   ciclopirox  8 % solution Commonly known as: PENLAC  APPLY TOPICALLY TO AFFECTED NAIL(S) AT BEDTIME. APPLY OVER NAIL & SURROUNDING SKIN. APPLY DAILY OVER PREVIOUS COAT. **AFTER 7 DAYS, MAY REMOVE WITH ALCOHOL  AND CONTINUE CYCLE.**   Cranberry 500 MG Chew Chew 1,000 mg by mouth daily.   Docusate Sodium  100 MG capsule Take 100 mg by mouth 2 (two) times daily.   ferrous sulfate  325 (65 FE) MG tablet Take 1 tablet (325 mg total) by mouth every Monday, Wednesday, and Friday.   fexofenadine 180 MG  tablet Commonly known as: ALLEGRA Take 180 mg by mouth daily.   gabapentin  100 MG capsule Commonly known as: NEURONTIN  Take 100 mg by mouth daily.   hydrochlorothiazide  12.5 MG tablet Commonly known as: HYDRODIURIL  Take 12.5 mg by mouth daily. What changed: Another medication with the same name was removed. Continue taking this medication, and follow the directions you see here. Changed by: Lord Lancour R Cashawn Yanko   hydrocortisone  2.5 % rectal cream Commonly known as: ANUSOL -HC Apply 1 Application topically 2 (two) times daily as needed.   hydrocortisone  25 MG suppository Commonly known as: ANUSOL -HC Place 25 mg rectally 2 (two) times daily.   L-Lysine 1000 MG Tabs Take 1,000 mg by mouth as needed. As needed for fever blister   lactulose  10 GM/15ML solution Commonly known as: CHRONULAC  Take 45 mLs by mouth every 12 (twelve) hours as needed for mild constipation.   levothyroxine  75 MCG tablet Commonly known as: SYNTHROID  Take 1 tablet (75 mcg total) by mouth daily.   lidocaine -prilocaine  cream Commonly known as: EMLA  Apply 1 Application topically as needed.   losartan  50 MG tablet Commonly known as: COZAAR  Take 50 mg by mouth daily.   Magnesium  200 MG Tabs Take 200 mg by mouth daily.   nystatin  powder Apply 1 Application topically daily.   omeprazole 40 MG capsule Commonly known as: PRILOSEC Take 40 mg by mouth 2 (two) times daily. What changed: Another medication with the same name was removed. Continue taking this medication, and follow the directions you see here. Changed by: Maude JONELLE Crease   potassium chloride  10 MEQ CR capsule Commonly known as: MICRO-K  Take 10 mEq by mouth daily.   potassium chloride  10 MEQ tablet Commonly known as: KLOR-CON  M Take 10 mEq by mouth once.   senna 8.6 MG Tabs tablet Commonly known as: SENOKOT Take 2 tablets (17.2 mg total) by mouth at bedtime.   venlafaxine  XR 75 MG 24 hr capsule Commonly known as: EFFEXOR -XR Take 3  capsules (225 mg total) by mouth daily.   vitamin A & D ointment Apply 1 Application topically as needed for dry skin.        Allergies:  Allergies  Allergen Reactions   Benazepril  Anaphylaxis    Possible cause of tongue swelling   Penicillins Anaphylaxis    Has patient had a PCN reaction causing immediate rash, facial/tongue/throat swelling, SOB or lightheadedness with hypotension: yes, Has patient had a PCN reaction causing severe rash involving mucus membranes or skin necrosis:  no, Has patient had a PCN reaction that required hospitalization:  no, Has patient had a PCN reaction occurring within the last 10 years: no, If all of the above answers are NO, then may proceed  with Cephalosporin use.   Advil [Ibuprofen] Itching and Rash    Rash that turns to blisters   Codeine Hives    hives   Crestor [Rosuvastatin Calcium ] Other (See Comments)    Pain in joints   Sulfamethoxazole-Trimethoprim Hives    hives   Bacitracin-Polymyxin B Dermatitis and Rash   Other Rash    Band-aid    Past Medical History, Surgical history, Social history, and Family History were reviewed and updated.  Review of Systems: Review of Systems  Constitutional: Negative.   HENT: Negative.    Eyes: Negative.   Respiratory: Negative.    Cardiovascular: Negative.   Gastrointestinal: Negative.   Genitourinary: Negative.   Musculoskeletal: Negative.   Skin: Negative.   Neurological: Negative.   Endo/Heme/Allergies: Negative.   Psychiatric/Behavioral: Negative.       Physical Exam:  height is 5' 1.5 (1.562 m) and weight is 187 lb (84.8 kg). Her oral temperature is 98 F (36.7 C). Her blood pressure is 126/55 (abnormal) and her pulse is 77. Her respiration is 20 and oxygen saturation is 100%.   .  Physical Exam Vitals reviewed.  Constitutional:      Comments: Her breast exam shows right breast with no masses, edema or erythema.  There is no right axillary adenopathy.  Left breast shows the  lumpectomy that is well-healed.  She has some contraction of the left breast.  There are some firmness at the lumpectomy site secondary to prior infection that required secondary intention.  There is no distinct mass in the left breast.  There is no left axillary adenopathy.  There is some telangiectasias on the left breast from radiation.  HENT:     Head: Normocephalic and atraumatic.  Eyes:     Pupils: Pupils are equal, round, and reactive to light.  Cardiovascular:     Rate and Rhythm: Normal rate and regular rhythm.     Heart sounds: Normal heart sounds.  Pulmonary:     Effort: Pulmonary effort is normal.     Breath sounds: Normal breath sounds.  Abdominal:     General: Bowel sounds are normal.     Palpations: Abdomen is soft.  Genitourinary:    Comments: Rectal exam shows some small external hemorrhoids.  She had no mass in the rectal vault.  Her stool was dark brown and heme positive. Musculoskeletal:        General: No tenderness or deformity. Normal range of motion.     Cervical back: Normal range of motion.  Lymphadenopathy:     Cervical: No cervical adenopathy.  Skin:    General: Skin is warm and dry.     Findings: No erythema or rash.  Neurological:     Mental Status: She is alert and oriented to person, place, and time.     Comments: Neurologically, there is no focal deficits.  She has a very flat affect.  She has a sort of monotone type voice.  Psychiatric:        Behavior: Behavior normal.        Thought Content: Thought content normal.        Judgment: Judgment normal.      Lab Results  Component Value Date   WBC 5.9 05/23/2024   HGB 12.2 05/23/2024   HCT 37.7 05/23/2024   MCV 100.0 05/23/2024   PLT 248 05/23/2024   Lab Results  Component Value Date   FERRITIN 39 12/01/2023   IRON  99 12/01/2023   TIBC 337 12/01/2023  UIBC 238 12/01/2023   IRONPCTSAT 29 12/01/2023   Lab Results  Component Value Date   RETICCTPCT 2.1 05/29/2022   RBC 3.77 (L)  05/23/2024   No results found for: KPAFRELGTCHN, LAMBDASER, KAPLAMBRATIO No results found for: KIMBERLY LE, IGMSERUM No results found for: STEPHANY CARLOTA BENSON MARKEL EARLA JOANNIE DOC VICK, SPEI   Chemistry      Component Value Date/Time   NA 140 05/23/2024 1244   NA 141 04/11/2024 0000   NA 143 10/08/2017 1042   NA 141 06/16/2017 1014   K 3.5 05/23/2024 1244   K 3.4 10/08/2017 1042   K 3.8 06/16/2017 1014   CL 101 05/23/2024 1244   CL 100 10/08/2017 1042   CO2 28 05/23/2024 1244   CO2 30 10/08/2017 1042   CO2 31 (H) 06/16/2017 1014   BUN 8 05/23/2024 1244   BUN 10 04/11/2024 0000   BUN 10 10/08/2017 1042   BUN 8.9 06/16/2017 1014   CREATININE 0.81 05/23/2024 1244   CREATININE 0.9 10/08/2017 1042   CREATININE 0.8 06/16/2017 1014   GLU 113 04/11/2024 0000      Component Value Date/Time   CALCIUM  9.4 05/23/2024 1244   CALCIUM  9.5 10/08/2017 1042   CALCIUM  10.1 06/16/2017 1014   ALKPHOS 85 05/23/2024 1244   ALKPHOS 68 10/08/2017 1042   ALKPHOS 64 06/16/2017 1014   AST 25 05/23/2024 1244   AST 21 06/16/2017 1014   ALT 22 05/23/2024 1244   ALT 20 10/08/2017 1042   ALT 20 06/16/2017 1014   BILITOT 0.3 05/23/2024 1244   BILITOT 0.37 06/16/2017 1014      Impression and Plan: Jessica [name] is a very pleasant 83 yo caucasian female with stage IIA ductal carcinoma of the left breast.  Her breast cancer is TRIPLE NEGATIVE.    She had her partial mastectomy in May 2018 followed by chemotherapy and radiation.  She completed chemotherapy in November 2018.  She then had radiation and completed radiation in January 2019.  I am glad that she is at [facility].  I know they do have activities for their residents.  Again, we will get the Port-A-Cath taken out.  It really has been a true pleasure to help Jessica [name].  She has had such great support from her family.  I told her that we can always get her back if there are any problems  at all.  I would be more than happy to see her again.  Maude JONELLE Crease, MD 7/22/20251:55 PM

## 2024-05-23 NOTE — Patient Instructions (Signed)

## 2024-05-24 ENCOUNTER — Encounter: Payer: Self-pay | Admitting: *Deleted

## 2024-05-30 DIAGNOSIS — M6281 Muscle weakness (generalized): Secondary | ICD-10-CM | POA: Diagnosis not present

## 2024-05-30 DIAGNOSIS — I69911 Memory deficit following unspecified cerebrovascular disease: Secondary | ICD-10-CM | POA: Diagnosis not present

## 2024-05-30 DIAGNOSIS — I679 Cerebrovascular disease, unspecified: Secondary | ICD-10-CM | POA: Diagnosis not present

## 2024-06-07 ENCOUNTER — Non-Acute Institutional Stay: Admitting: Orthopedic Surgery

## 2024-06-07 ENCOUNTER — Encounter: Payer: Self-pay | Admitting: Orthopedic Surgery

## 2024-06-07 DIAGNOSIS — F339 Major depressive disorder, recurrent, unspecified: Secondary | ICD-10-CM

## 2024-06-07 DIAGNOSIS — R410 Disorientation, unspecified: Secondary | ICD-10-CM

## 2024-06-07 NOTE — Progress Notes (Signed)
 Location:   [facility]  Nursing Home Room Number: 18-A Place of Service:  ALF [phone]) Provider:  Greig Cluster, NP  PCP: Charlanne Fredia CROME, MD  Patient Care Team: Charlanne Fredia CROME, MD as PCP - General (Internal Medicine)  Extended Emergency Contact Information Primary Emergency Contact: Valora Darice MORITA, [place] Home Phone: [phone] Mobile Phone: [phone] Relation: Daughter Secondary Emergency Contact: Rodgers Tamera MORITA, [place] Home Phone: [phone] Mobile Phone: [phone] Relation: Daughter  Code Status:  DNR Goals of care: Advanced Directive information    06/07/2024    2:41 PM  Advanced Directives  Does Patient Have a Medical Advance Directive? Yes  Type of Estate agent of [place];Out of facility DNR (pink MOST or yellow form)  Does patient want to make changes to medical advance directive? No - Patient declined  Copy of Healthcare Power of Attorney in Chart? Yes - validated most recent copy scanned in chart (See row information)     Chief Complaint  Patient presents with   Intermittent Confusion    HPI:  Pt is a 83 y.o. female seen today for acute visit due to intermittent confusion.   She currently resides on the assisted living unit at [facility]. PMH: HTN, HLD, asthma, OSA, GERD, hypothyroidism, acute encephalopathy (hospitalized 0821/2024), left thalamic infarction, osteopenia, depression, iron  deficiency anemia, and obesity.   Nursing reports some odd behaviors and increased confusion within past 8 weeks. She has been observed forgetting her room, calling objects another name, and acting scared of things that she has not been afraid of in the past. This morning OT cancelled swimming session. After notification, she was observed standing in doorway of bathroom with blank stare. She was non verbal to nursing. She skipped lunch. Daughter reports  similar behavior recently when she would not purchase something for her. H/o acute encephalopathy, stroke and depression. Today, she is sitting in recliner with blank stare, nonverbal and avoiding eye contact. She will only squeeze my hands. Treatment options discussed with daughter, will do workup on campus.    Past Medical History:  Diagnosis Date   Allergy    Anemia    Anxiety    Asthma    in past, no inhalers now   Blood transfusion without reported diagnosis    Breast cancer (HCC) 01/2017   left breast    Breast cancer of upper-inner quadrant of left female breast (HCC) 03/11/2017   Cancer (HCC) 01/2017   left breast   Cataract    bilateral removed   Chronic kidney disease    kidney stones   Depression 08/31/2013   Dyslipidemia, goal LDL below 160 08/31/2013   Essential hypertension - well controlled 08/31/2013   Family history of breast cancer    Family history of melanoma    Family history of prostate cancer    GERD (gastroesophageal reflux disease)    Goals of care, counseling/discussion 03/11/2017   History of radiation therapy 10/13/17-11/11/17   left breast 40.05 Gy in 15 fractions, left breast boost 10 Gy in 5 fractions   Hypothyroidism    Ischemic stroke [facility])    May 2023;   Obesity (BMI 30-39.9) 08/31/2013   Personal history of chemotherapy 2018   Personal history of radiation therapy 2019   Thyroid  disease    Vasovagal near-syncope -rare 08/31/2013   Past Surgical History:  Procedure Laterality  Date   ABDOMINAL HYSTERECTOMY     total   BREAST BIOPSY Left 02/24/2017    malignant   BREAST LUMPECTOMY Left 03/23/2017   broken fingers     CATARACT EXTRACTION Bilateral    COLONOSCOPY     COLONOSCOPY WITH PROPOFOL  N/A 06/03/2022   Procedure: COLONOSCOPY WITH PROPOFOL ;  Surgeon: Stacia Glendia BRAVO, MD;  Location: [facility];  Service: Gastroenterology;  Laterality: N/A;   ESOPHAGOGASTRODUODENOSCOPY (EGD) WITH PROPOFOL  N/A 05/29/2022   Procedure:  ESOPHAGOGASTRODUODENOSCOPY (EGD) WITH PROPOFOL ;  Surgeon: Albertus Gordy HERO, MD;  Location: [facility];  Service: Gastroenterology;  Laterality: N/A;   EXCISIONAL HEMORRHOIDECTOMY     IR FLUORO GUIDE PORT INSERTION RIGHT  08/04/2017   IR REMOVAL TUN ACCESS W/ PORT W/O FL MOD SED  08/04/2017   IR US  GUIDE VASC ACCESS RIGHT  08/04/2017   IRRIGATION AND DEBRIDEMENT ABSCESS Left 05/18/2017   Procedure: IRRIGATION AND DEBRIDEMENT LEFT AXILLARY ABSCESS;  Surgeon: Sheldon Standing, MD;  Location: [facility];  Service: General;  Laterality: Left;   kidney stones lithotripsy     POLYPECTOMY  06/03/2022   Procedure: POLYPECTOMY;  Surgeon: Stacia Glendia BRAVO, MD;  Location: [facility];  Service: Gastroenterology;;   TY PLACEMENT Right 03/23/2017   Procedure: INSERTION PORT-A-CATH;  Surgeon: Vanderbilt Ned, MD;  Location: [facility];  Service: General;  Laterality: Right;   RADIOACTIVE SEED GUIDED PARTIAL MASTECTOMY WITH AXILLARY SENTINEL LYMPH NODE BIOPSY Left 03/23/2017   Procedure: LEFT BREAST RADIOACTIVE SEED GUIDED PARTIAL MASTECTOMY AND  SENTINEL LYMPH NODE MAPPING;  Surgeon: Vanderbilt Ned, MD;  Location: [facility];  Service: General;  Laterality: Left;   ROTATOR CUFF REPAIR     left    TONSILLECTOMY     WISDOM TOOTH EXTRACTION      Allergies  Allergen Reactions   Benazepril  Anaphylaxis    Possible cause of tongue swelling   Penicillins Anaphylaxis    Has patient had a PCN reaction causing immediate rash, facial/tongue/throat swelling, SOB or lightheadedness with hypotension: yes, Has patient had a PCN reaction causing severe rash involving mucus membranes or skin necrosis:  no, Has patient had a PCN reaction that required hospitalization:  no, Has patient had a PCN reaction occurring within the last 10 years: no, If all of the above answers are NO, then may proceed with Cephalosporin use.   Advil [Ibuprofen] Itching and Rash    Rash that turns to blisters   Codeine  Hives    hives   Crestor [Rosuvastatin Calcium ] Other (See Comments)    Pain in joints   Sulfamethoxazole-Trimethoprim Hives    hives   Bacitracin-Polymyxin B Dermatitis and Rash   Other Rash    Band-aid    Allergies as of 06/07/2024       Reactions   Benazepril  Anaphylaxis   Possible cause of tongue swelling   Penicillins Anaphylaxis   Has patient had a PCN reaction causing immediate rash, facial/tongue/throat swelling, SOB or lightheadedness with hypotension: yes, Has patient had a PCN reaction causing severe rash involving mucus membranes or skin necrosis:  no, Has patient had a PCN reaction that required hospitalization:  no, Has patient had a PCN reaction occurring within the last 10 years: no, If all of the above answers are NO, then may proceed with Cephalosporin use.   Advil [ibuprofen] Itching, Rash   Rash that turns to blisters   Codeine Hives   hives   Crestor [rosuvastatin Calcium ] Other (See Comments)   Pain in joints  Sulfamethoxazole-trimethoprim Hives   hives   Bacitracin-polymyxin B Dermatitis, Rash   Other Rash   Band-aid        Medication List        Accurate as of June 07, 2024  2:57 PM. If you have any questions, ask your nurse or doctor.          acetaminophen  500 MG tablet Commonly known as: TYLENOL  Take 500 mg by mouth every 6 (six) hours as needed.   amLODipine  5 MG tablet Commonly known as: NORVASC  Take 5 mg by mouth daily.   aspirin  81 MG chewable tablet Chew 1 tablet (81 mg total) by mouth daily.   atorvastatin  80 MG tablet Commonly known as: LIPITOR  Take 1 tablet (80 mg total) by mouth at bedtime.   CENTRUM ADULTS MULTIGUMMIES PO Take 2 each by mouth daily.   cephALEXin  250 MG capsule Commonly known as: KEFLEX  Take 250 mg by mouth daily.   cholecalciferol  25 MCG (1000 UNIT) tablet Commonly known as: VITAMIN D3 Take 1 tablet (1,000 Units total) by mouth daily.   ciclopirox  8 % solution Commonly known as: PENLAC  APPLY  TOPICALLY TO AFFECTED NAIL(S) AT BEDTIME. APPLY OVER NAIL & SURROUNDING SKIN. APPLY DAILY OVER PREVIOUS COAT. **AFTER 7 DAYS, MAY REMOVE WITH ALCOHOL  AND CONTINUE CYCLE.**   Cranberry 500 MG Chew Chew 1,000 mg by mouth daily.   CRANBERRY PO Take 2 Pieces by mouth daily. Give 2 gummy by mouth for Prophylaxis.   Docusate Sodium  100 MG capsule Take 100 mg by mouth 2 (two) times daily.   ferrous sulfate  325 (65 FE) MG tablet Take 1 tablet (325 mg total) by mouth every Monday, Wednesday, and Friday.   fexofenadine 180 MG tablet Commonly known as: ALLEGRA Take 180 mg by mouth daily.   gabapentin  100 MG capsule Commonly known as: NEURONTIN  Take 100 mg by mouth daily.   hydrochlorothiazide  12.5 MG tablet Commonly known as: HYDRODIURIL  Take 12.5 mg by mouth daily.   hydrocortisone  2.5 % rectal cream Commonly known as: ANUSOL -HC Apply 1 Application topically 2 (two) times daily as needed.   hydrocortisone  25 MG suppository Commonly known as: ANUSOL -HC Place 25 mg rectally 2 (two) times daily.   L-Lysine 1000 MG Tabs Take 1,000 mg by mouth as needed. As needed for fever blister   lactulose  10 GM/15ML solution Commonly known as: CHRONULAC  Take 45 mLs by mouth every 12 (twelve) hours as needed for mild constipation.   levothyroxine  75 MCG tablet Commonly known as: SYNTHROID  Take 1 tablet (75 mcg total) by mouth daily.   lidocaine -prilocaine  cream Commonly known as: EMLA  Apply 1 Application topically as needed.   losartan  50 MG tablet Commonly known as: COZAAR  Take 50 mg by mouth daily.   Magnesium  200 MG Tabs Take 200 mg by mouth daily.   nystatin  powder Apply 1 Application topically daily.   omeprazole 40 MG capsule Commonly known as: PRILOSEC Take 40 mg by mouth 2 (two) times daily.   potassium chloride  10 MEQ CR capsule Commonly known as: MICRO-K  Take 10 mEq by mouth daily.   potassium chloride  10 MEQ tablet Commonly known as: KLOR-CON  M Take 10 mEq by mouth  once.   senna 8.6 MG Tabs tablet Commonly known as: SENOKOT Take 2 tablets (17.2 mg total) by mouth at bedtime.   venlafaxine  XR 75 MG 24 hr capsule Commonly known as: EFFEXOR -XR Take 3 capsules (225 mg total) by mouth daily.   vitamin A & D ointment Apply 1 Application topically as needed  for dry skin.        Review of Systems  Unable to perform ROS: Patient nonverbal    Immunization History  Administered Date(s) Administered   DTaP 07/10/2022   Influenza Split 08/13/2017   Influenza, High Dose Seasonal PF 09/03/2015, 08/05/2016, 08/31/2018, 07/26/2019   Influenza, Quadrivalent, Recombinant, Inj, Pf 08/31/2018, 07/26/2019   Influenza,trivalent, recombinat, inj, PF 08/31/2018   Influenza-Unspecified 08/27/2012, 09/05/2013, 08/26/2016   PFIZER(Purple Top)SARS-COV-2 Vaccination 11/27/2019, 12/18/2019   PNEUMOCOCCAL CONJUGATE-20 02/05/2022   Pneumococcal Conjugate-13 10/05/2013, 07/26/2019   Pneumococcal Polysaccharide-23 03/25/2003   Td 03/25/2003   Tdap 03/25/2003   Zoster Recombinant(Shingrix) 12/27/2018, 07/26/2019   Pertinent  Health Maintenance Due  Topic Date Due   DEXA SCAN  Never done   INFLUENZA VACCINE  06/02/2024   Colonoscopy  06/03/2025      10/05/2022    2:09 PM 01/12/2023    2:11 PM 07/23/2023    2:56 PM 10/06/2023    4:20 PM 04/07/2024    3:55 PM  Fall Risk  Falls in the past year? 0 0 1 1 0  Was there an injury with Fall? 0 0 1 0 0  Fall Risk Category Calculator 0 0 3 1 0  Fall Risk Category (Retired) Low       Patient at Risk for Falls Due to   History of fall(s);Impaired balance/gait;Impaired mobility History of fall(s);Impaired balance/gait;Impaired mobility History of fall(s)  Fall risk Follow up   Falls evaluation completed;Education provided;Falls prevention discussed Falls evaluation completed;Education provided;Falls prevention discussed Falls evaluation completed;Education provided     Data saved with a previous flowsheet row definition    Functional Status Survey:    Vitals:   06/07/24 1439  BP: 108/62  Pulse: 80  Resp: 20  Temp: 97.7 F (36.5 C)  SpO2: 96%  Weight: 190 lb 12.8 oz (86.5 kg)  Height: 5' 1.5 (1.562 m)   Body mass index is 35.47 kg/m. Physical Exam Vitals reviewed.  Constitutional:      General: She is not in acute distress.    Appearance: She is obese. She is not ill-appearing.  HENT:     Head: Normocephalic and atraumatic.  Eyes:     General:        Right eye: No discharge.        Left eye: No discharge.     Comments: UTA EOM  Cardiovascular:     Rate and Rhythm: Normal rate and regular rhythm.     Pulses: Normal pulses.     Heart sounds: Normal heart sounds.  Pulmonary:     Effort: Pulmonary effort is normal.     Breath sounds: Examination of the right-lower field reveals decreased breath sounds. Examination of the left-lower field reveals decreased breath sounds. Decreased breath sounds present.     Comments: 02 sat 90% Abdominal:     General: Bowel sounds are normal.     Palpations: Abdomen is soft.  Musculoskeletal:     Cervical back: Neck supple.     Right lower leg: No edema.     Left lower leg: No edema.  Skin:    General: Skin is warm.     Capillary Refill: Capillary refill takes less than 2 seconds.     Comments: Forehead warm to touch  Neurological:     General: No focal deficit present.     Mental Status: She is alert.     Gait: Gait abnormal.  Psychiatric:     Comments: Flat affect, nonverbal, alert to self,  does not follow commands     Labs reviewed: Recent Labs    06/24/23 1640 12/01/23 1420 04/11/24 0000 05/23/24 1244  NA 139 140 141 140  K 4.0 3.4* 3.8 3.5  CL 97* 102 102 101  CO2 28 30 27* 28  GLUCOSE 149* 97  --  127*  BUN 8 11 10 8   CREATININE 0.75 0.63 0.7 0.81  CALCIUM  9.5 9.5 9.5 9.4   Recent Labs    06/23/23 1823 12/01/23 1420 04/11/24 0000 05/23/24 1244  AST 23 25 22 25   ALT 27 22 21 22   ALKPHOS 68 66 74 85  BILITOT 0.6 0.4   --  0.3  PROT 7.0 6.3*  --  6.8  ALBUMIN 4.2 4.2 4.2 4.1   Recent Labs    06/25/23 0553 12/01/23 1420 04/11/24 0000 05/23/24 1244  WBC 4.9 6.1 6.6 5.9  NEUTROABS  --  4.2 4,204.00 3.6  HGB 12.7 11.6* 12.7 12.2  HCT 39.0 35.4* 38 37.7  MCV 101.0* 100.6*  --  100.0  PLT 224 266 288 248   Lab Results  Component Value Date   TSH 0.567 06/23/2023   Lab Results  Component Value Date   HGBA1C 5.6 03/21/2022   Lab Results  Component Value Date   CHOL 297 (H) 03/21/2022   HDL 49 03/21/2022   LDLCALC 216 (H) 03/21/2022   TRIG 161 (H) 03/21/2022   CHOLHDL 6.1 03/21/2022    Significant Diagnostic Results in last 30 days:  No results found.  Assessment/Plan 1. Confusion (Primary) - odd behaviors x 8 weeks  - differentials: depression, dementia, ? TIA/CVA - lung sounds diminished, O2 sat 90%, forehead warm, blank stare, nonverbal - plan for workup in house  - cbc/diff, bmp, CXR - consider ST evaluation for cognitive testing in future  2. Recurrent depression (HCC) - see above - cont Effexor     Family/ staff Communication: plan discussed with patient, daughter and nurse  Labs/tests ordered:  cbc/diff, bmp, CXR

## 2024-06-08 DIAGNOSIS — Z978 Presence of other specified devices: Secondary | ICD-10-CM | POA: Diagnosis not present

## 2024-06-08 DIAGNOSIS — J9809 Other diseases of bronchus, not elsewhere classified: Secondary | ICD-10-CM | POA: Diagnosis not present

## 2024-06-08 DIAGNOSIS — D649 Anemia, unspecified: Secondary | ICD-10-CM | POA: Diagnosis not present

## 2024-06-08 DIAGNOSIS — I10 Essential (primary) hypertension: Secondary | ICD-10-CM | POA: Diagnosis not present

## 2024-06-08 LAB — BASIC METABOLIC PANEL WITH GFR
BUN: 9 (ref 4–21)
CO2: 29 — AB (ref 13–22)
Chloride: 101 (ref 99–108)
Creatinine: 0.7 (ref 0.5–1.1)
Glucose: 83
Potassium: 4.1 meq/L (ref 3.5–5.1)
Sodium: 139 (ref 137–147)

## 2024-06-08 LAB — CBC AND DIFFERENTIAL
HCT: 37 (ref 36–46)
Hemoglobin: 12.3 (ref 12.0–16.0)
Neutrophils Absolute: 2979
Platelets: 193 K/uL (ref 150–400)
WBC: 5.3

## 2024-06-08 LAB — CBC: RBC: 3.79 — AB (ref 3.87–5.11)

## 2024-06-08 LAB — COMPREHENSIVE METABOLIC PANEL WITH GFR: Calcium: 9.6 (ref 8.7–10.7)

## 2024-06-09 ENCOUNTER — Encounter (HOSPITAL_COMMUNITY): Payer: Self-pay

## 2024-06-09 ENCOUNTER — Ambulatory Visit (HOSPITAL_COMMUNITY)
Admission: RE | Admit: 2024-06-09 | Discharge: 2024-06-09 | Disposition: A | Discharge status: Home / Self Care | Source: Ambulatory Visit | Attending: Hematology & Oncology | Admitting: Hematology & Oncology

## 2024-06-09 ENCOUNTER — Other Ambulatory Visit: Payer: Self-pay

## 2024-06-09 DIAGNOSIS — Z7722 Contact with and (suspected) exposure to environmental tobacco smoke (acute) (chronic): Secondary | ICD-10-CM | POA: Insufficient documentation

## 2024-06-09 DIAGNOSIS — Z171 Estrogen receptor negative status [ER-]: Secondary | ICD-10-CM | POA: Insufficient documentation

## 2024-06-09 DIAGNOSIS — Z66 Do not resuscitate: Secondary | ICD-10-CM | POA: Insufficient documentation

## 2024-06-09 DIAGNOSIS — Z452 Encounter for adjustment and management of vascular access device: Secondary | ICD-10-CM | POA: Diagnosis not present

## 2024-06-09 DIAGNOSIS — C50212 Malignant neoplasm of upper-inner quadrant of left female breast: Secondary | ICD-10-CM | POA: Diagnosis not present

## 2024-06-09 HISTORY — PX: IR REMOVAL TUN ACCESS W/ PORT W/O FL MOD SED: IMG2290

## 2024-06-09 MED ORDER — FENTANYL CITRATE (PF) 100 MCG/2ML IJ SOLN
INTRAMUSCULAR | Status: AC | PRN
  Administered 2024-06-09: 25 ug via INTRAVENOUS

## 2024-06-09 MED ORDER — LIDOCAINE-EPINEPHRINE 1 %-1:100000 IJ SOLN
INTRAMUSCULAR | Status: AC
Start: 2024-06-09 — End: 2024-06-09
  Filled 2024-06-09: qty 1

## 2024-06-09 MED ORDER — MIDAZOLAM HCL 2 MG/2ML IJ SOLN
INTRAMUSCULAR | Status: AC | PRN
  Administered 2024-06-09: .5 mg via INTRAVENOUS

## 2024-06-09 MED ORDER — MIDAZOLAM HCL 2 MG/2ML IJ SOLN
INTRAMUSCULAR | Status: AC
Start: 2024-06-09 — End: 2024-06-09
  Filled 2024-06-09: qty 2

## 2024-06-09 MED ORDER — FENTANYL CITRATE (PF) 100 MCG/2ML IJ SOLN
INTRAMUSCULAR | Status: AC
  Filled 2024-06-09: qty 2

## 2024-06-09 MED ORDER — LIDOCAINE-EPINEPHRINE 1 %-1:100000 IJ SOLN
20.0000 mL | Freq: Once | INTRAMUSCULAR | Status: AC
  Administered 2024-06-09: 10 mL via INTRADERMAL

## 2024-06-09 MED ORDER — SODIUM CHLORIDE 0.9 % IV SOLN: INTRAVENOUS | Status: DC

## 2024-06-09 NOTE — Discharge Instructions (Signed)
 Moderate Conscious Sedation-Care After  This sheet gives you information about how to care for yourself after your procedure. Your health care provider may also give you more specific instructions. If you have problems or questions, contact your health care provider.  After the procedure, it is common to have: Sleepiness for several hours. Impaired judgment for several hours. Difficulty with balance. Vomiting if you eat too soon.  Follow these instructions at home:  Rest. Do not participate in activities where you could fall or become injured. Do not drive or use machinery. Do not drink alcohol . Do not take sleeping pills or medicines that cause drowsiness. Do not make important decisions or sign legal documents. Do not take care of children on your own.  Eating and drinking Follow the diet recommended by your health care provider. Drink enough fluid to keep your urine pale yellow. If you vomit: Drink water , juice, or soup when you can drink without vomiting. Make sure you have little or no nausea before eating solid foods.  General instructions Take over-the-counter and prescription medicines only as told by your health care provider. Have a responsible adult stay with you for the time you are told. It is important to have someone help care for you until you are awake and alert. Do not smoke. Keep all follow-up visits as told by your health care provider. This is important.  Contact a health care provider if: You are still sleepy or having trouble with balance after 24 hours. You feel light-headed. You keep feeling nauseous or you keep vomiting. You develop a rash. You have a fever. You have redness or swelling around the IV site.  Get help right away if: You have trouble breathing. You have new-onset confusion at home.  This information is not intended to replace advice given to you by your health care provider. Make sure you discuss any questions you have with your  healthcare provider.   Please call Interventional Radiology clinic [phone] with any questions or concerns.  You may remove your dressing and shower tomorrow.  After the procedure, it is common to have: Discomfort at the port removal site. Bruising on the skin over the port site. This should improve over 3-4 days  Follow these instructions at home:  Medication: Do not use Aspirin  or ibuprofen products, such as Advil or Motrin, as it may increase bleeding.  You may resume your usual medications as ordered by your doctor. If your doctor prescribed antibiotics, take them as directed. Do not stop taking them just because you feel better. You need to take the full course of antibiotics.  Eating and drinking: Drink plenty of liquids to keep your urine pale yellow You can resume your regular diet as directed by your doctor   Care of the procedure site Follow instructions from your health care provider about how to take care of your port insertion site. Make sure you: DO NOT use any lotions, creams, or ointments on incision for 2 weeks. This will remove the surgical glue on your incision Wash your hands with soap and water  before and after you change your bandage (dressing). If soap and water  are not available, use hand sanitizer Change your dressing as told by your health care provider Leave skin glue, or adhesive strips in place. These skin closures may need to stay in place for 2 weeks or longer Check your port removal site every day for signs of infection. Check for: Redness, swelling, or pain Fluid or blood Warmth Pus or a  bad smell  Activity Return to your normal activities as told by your health care provider. Ask your health care provider what activities are safe for you Do not lift anything that is heavier than 10 lb (4.5 kg), or the limit that you are told, until your health care provider says that it is safe Do not take baths, swim, or use a hot tub until your health care  provider approves. Take showers only. Keep all follow-up visits as told by your doctor  Contact a health care provider if: You have a fever or chills You have redness, swelling, or pain around your port removal site You have fluid or blood coming from your port removal site Your port site feels warm to the touch You have pus or a bad smell coming from the port site  Get help right away if: You have chest pain or shortness of breath You have bleeding from your port that you cannot control

## 2024-06-09 NOTE — H&P (Signed)
 Chief Complaint: Remote left breast cancer, Port-A-Cath placement in 2018 -which is no longer needed; referred for Port-A-Cath removal with moderate sedation  Referring Provider(s): Ennever,P  Supervising Physician: Jenna Hacker  Patient Status: [facility] - Out-pt  History of Present Illness: Jessica [name] is an 83 y.o. female with past medical history significant for anemia, anxiety, asthma, chronic kidney disease, sleep apnea, depression, hyperlipidemia, hypertension, GERD, hypothyroidism, prior CVA 2023, obesity, and left breast cancer in 2018 with existing right chest wall Port-A-Cath.  Her Port-A-Cath is no longer needed and she presents today for Port-A-Cath removal.   Patient is DNR  Past Medical History:  Diagnosis Date   Allergy    Anemia    Anxiety    Asthma    in past, no inhalers now   Blood transfusion without reported diagnosis    Breast cancer (HCC) 01/2017   left breast    Breast cancer of upper-inner quadrant of left female breast (HCC) 03/11/2017   Cancer (HCC) 01/2017   left breast   Cataract    bilateral removed   Chronic kidney disease    kidney stones   Depression 08/31/2013   Dyslipidemia, goal LDL below 160 08/31/2013   Essential hypertension - well controlled 08/31/2013   Family history of breast cancer    Family history of melanoma    Family history of prostate cancer    GERD (gastroesophageal reflux disease)    Goals of care, counseling/discussion 03/11/2017   History of radiation therapy 10/13/17-11/11/17   left breast 40.05 Gy in 15 fractions, left breast boost 10 Gy in 5 fractions   Hypothyroidism    Ischemic stroke [facility])    May 2023;   Obesity (BMI 30-39.9) 08/31/2013   Personal history of chemotherapy 2018   Personal history of radiation therapy 2019   Thyroid  disease    Vasovagal near-syncope -rare 08/31/2013    Past Surgical History:  Procedure Laterality Date   ABDOMINAL HYSTERECTOMY     total   BREAST BIOPSY Left  02/24/2017    malignant   BREAST LUMPECTOMY Left 03/23/2017   broken fingers     CATARACT EXTRACTION Bilateral    COLONOSCOPY     COLONOSCOPY WITH PROPOFOL  N/A 06/03/2022   Procedure: COLONOSCOPY WITH PROPOFOL ;  Surgeon: Stacia Glendia BRAVO, MD;  Location: [facility];  Service: Gastroenterology;  Laterality: N/A;   ESOPHAGOGASTRODUODENOSCOPY (EGD) WITH PROPOFOL  N/A 05/29/2022   Procedure: ESOPHAGOGASTRODUODENOSCOPY (EGD) WITH PROPOFOL ;  Surgeon: Albertus Gordy HERO, MD;  Location: [facility];  Service: Gastroenterology;  Laterality: N/A;   EXCISIONAL HEMORRHOIDECTOMY     IR FLUORO GUIDE PORT INSERTION RIGHT  08/04/2017   IR REMOVAL TUN ACCESS W/ PORT W/O FL MOD SED  08/04/2017   IR US  GUIDE VASC ACCESS RIGHT  08/04/2017   IRRIGATION AND DEBRIDEMENT ABSCESS Left 05/18/2017   Procedure: IRRIGATION AND DEBRIDEMENT LEFT AXILLARY ABSCESS;  Surgeon: Sheldon Standing, MD;  Location: [facility];  Service: General;  Laterality: Left;   kidney stones lithotripsy     POLYPECTOMY  06/03/2022   Procedure: POLYPECTOMY;  Surgeon: Stacia Glendia BRAVO, MD;  Location: [facility];  Service: Gastroenterology;;   TY PLACEMENT Right 03/23/2017   Procedure: INSERTION PORT-A-CATH;  Surgeon: Vanderbilt Ned, MD;  Location: [facility];  Service: General;  Laterality: Right;   RADIOACTIVE SEED GUIDED PARTIAL MASTECTOMY WITH AXILLARY SENTINEL LYMPH NODE BIOPSY Left 03/23/2017   Procedure: LEFT BREAST RADIOACTIVE SEED GUIDED PARTIAL MASTECTOMY AND  SENTINEL LYMPH NODE MAPPING;  Surgeon: Vanderbilt Ned, MD;  Location: [facility];  Service: General;  Laterality: Left;   ROTATOR CUFF REPAIR     left    TONSILLECTOMY     WISDOM TOOTH EXTRACTION      Allergies: Benazepril , Penicillins, Advil [ibuprofen], Codeine, Crestor [rosuvastatin calcium ], Sulfamethoxazole-trimethoprim, Bacitracin-polymyxin b, and Other  Medications: Prior to Admission medications   Medication Sig Start Date End Date Taking?  Authorizing Provider  acetaminophen  (TYLENOL ) 500 MG tablet Take 500 mg by mouth every 6 (six) hours as needed.   Yes [provider]  amLODipine  (NORVASC ) 5 MG tablet Take 5 mg by mouth daily.   Yes [provider]  aspirin  81 MG chewable tablet Chew 1 tablet (81 mg total) by mouth daily. 03/24/22  Yes Danford, Lonni SQUIBB, MD  atorvastatin  (LIPITOR ) 80 MG tablet Take 1 tablet (80 mg total) by mouth at bedtime. 03/30/22  Yes Angiulli, Toribio PARAS, PA-C  cephALEXin  (KEFLEX ) 250 MG capsule Take 250 mg by mouth daily.   Yes [provider]  cholecalciferol  (VITAMIN D3) 25 MCG (1000 UNIT) tablet Take 1 tablet (1,000 Units total) by mouth daily. 03/30/22  Yes Angiulli, Toribio PARAS, PA-C  ciclopirox  (PENLAC ) 8 % solution APPLY TOPICALLY TO AFFECTED NAIL(S) AT BEDTIME. APPLY OVER NAIL & SURROUNDING SKIN. APPLY DAILY OVER PREVIOUS COAT. **AFTER 7 DAYS, MAY REMOVE WITH ALCOHOL  AND CONTINUE CYCLE.** 06/15/23  Yes Gershon Donnice SAUNDERS, DPM  CRANBERRY PO Take 2 Pieces by mouth daily. Give 2 gummy by mouth for Prophylaxis.   Yes [provider]  Docusate Sodium  100 MG capsule Take 100 mg by mouth 2 (two) times daily.   Yes [provider]  ferrous sulfate  325 (65 Jessica) MG tablet Take 1 tablet (325 mg total) by mouth every Monday, Wednesday, and Friday. 12/15/23  Yes Medina-Vargas, Monina C, NP  fexofenadine (ALLEGRA) 180 MG tablet Take 180 mg by mouth daily.   Yes [provider]  gabapentin  (NEURONTIN ) 100 MG capsule Take 100 mg by mouth daily.   Yes [provider]  hydrochlorothiazide  (HYDRODIURIL ) 12.5 MG tablet Take 12.5 mg by mouth daily.   Yes [provider]  lactulose  (CHRONULAC ) 10 GM/15ML solution Take 45 mLs by mouth every 12 (twelve) hours as needed for mild constipation.   Yes [provider]  levothyroxine  (SYNTHROID ) 75 MCG tablet Take 1 tablet (75 mcg total) by mouth daily. 03/30/22  Yes Angiulli, Toribio PARAS, PA-C  losartan   (COZAAR ) 50 MG tablet Take 50 mg by mouth daily.   Yes [provider]  Magnesium  200 MG TABS Take 200 mg by mouth daily.   Yes [provider]  Multiple Vitamins-Minerals (CENTRUM ADULTS MULTIGUMMIES PO) Take 2 each by mouth daily.   Yes [provider]  nystatin  powder Apply 1 Application topically daily.   Yes [provider]  omeprazole (PRILOSEC) 40 MG capsule Take 40 mg by mouth 2 (two) times daily. 03/09/24  Yes [provider]  potassium chloride  (MICRO-K ) 10 MEQ CR capsule Take 10 mEq by mouth daily. 02/29/24  Yes [provider]  senna (SENOKOT) 8.6 MG TABS tablet Take 2 tablets (17.2 mg total) by mouth at bedtime. 04/07/24  Yes Fargo, Amy E, NP  venlafaxine  XR (EFFEXOR -XR) 75 MG 24 hr capsule Take 3 capsules (225 mg total) by mouth daily. 03/30/22  Yes Angiulli, Daniel J, PA-C  Vitamins A & D (VITAMIN A & D) ointment Apply 1 Application topically as needed for dry skin.   Yes [provider]  L-Lysine 1000 MG  TABS Take 1,000 mg by mouth as needed. As needed for fever blister    [provider]     Family History  Problem Relation Age of Onset   Heart disease Mother        Angina   Heart disease Father    Heart attack Brother        Died of MI in his mid 26s   Prostate cancer Brother        dx in his early 3s   Cervical cancer Maternal Aunt    Breast cancer Paternal Aunt        dx over 61   Bone cancer Cousin        maternal first cousin dx in her 12s-60s   Lung cancer Cousin        paternal first cousin   Colon cancer Neg Hx    Pancreatic cancer Neg Hx    Stomach cancer Neg Hx    Esophageal cancer Neg Hx    Rectal cancer Neg Hx    Seizures Neg Hx     Social History   Socioeconomic History   Marital status: Widowed    Spouse name: Not on file   Number of children: 3   Years of education: Not on file   Highest education level: Not on file  Occupational History   Occupation: retired   Tobacco Use    Smoking status: Never    Passive exposure: Past   Smokeless tobacco: Never   Tobacco comments:    Secondhand exposure, husband was heavy smoker  Vaping Use   Vaping status: Never Used  Substance and Sexual Activity   Alcohol  use: No   Drug use: No   Sexual activity: Not on file  Other Topics Concern   Not on file  Social History Narrative   Married mother of 3 with one grandchild. Never smoked. Does not drink alcohol .Walks 3-4 days a week for roughly 20-30 minutes.      Update 04/09/2023   Lives at [facility]   Right handed   Social Drivers of Health   Financial Resource Strain: Low Risk  (07/23/2023)   Overall Financial Resource Strain (CARDIA)    Difficulty of Paying Living Expenses: Not hard at all  Food Insecurity: No Food Insecurity (07/23/2023)   Hunger Vital Sign    Worried About Running Out of Food in the Last Year: Never true    Ran Out of Food in the Last Year: Never true  Transportation Needs: No Transportation Needs (07/23/2023)   PRAPARE - Administrator, Civil Service (Medical): No    Lack of Transportation (Non-Medical): No  Physical Activity: Insufficiently Active (07/23/2023)   Exercise Vital Sign    Days of Exercise per Week: 7 days    Minutes of Exercise per Session: 10 min  Stress: No Stress Concern Present (07/23/2023)   [employer] of Occupational Health - Occupational Stress Questionnaire    Feeling of Stress : Only a little  Social Connections: Socially Isolated (07/23/2023)   Social Connection and Isolation Panel    Frequency of Communication with Friends and Family: More than three times a week    Frequency of Social Gatherings with Friends and Family: More than three times a week    Attends Religious Services: Never    Database administrator or Organizations: No    Attends Banker Meetings: Never    Marital Status: Widowed  Review of Systems denies fever, headache, chest pain, dyspnea,  cough, abdominal/back pain, nausea, vomiting or bleeding  Vital Signs: BP 132/76   Pulse 71   Temp 99 F (37.2 C) (Oral)   Resp 20   SpO2 97%   Advance Care Plan: No documents on file    Physical Exam patient awake, very flat affect.  Will briefly answer a few simple questions.  Daughter in room.  Chest clear to auscultation bilaterally.  Clean, intact right chest wall Port-A-Cath.  Heart with regular rate and rhythm.  Abdomen obese, soft, positive bowel sounds, nontender.  Imaging: No results found.  Labs:  CBC: Recent Labs    06/25/23 0553 12/01/23 1420 04/11/24 0000 05/23/24 1244  WBC 4.9 6.1 6.6 5.9  HGB 12.7 11.6* 12.7 12.2  HCT 39.0 35.4* 38 37.7  PLT 224 266 288 248    COAGS: No results for input(s): INR, APTT in the last 8760 hours.  BMP: Recent Labs    06/23/23 1823 06/23/23 2023 06/24/23 1640 12/01/23 1420 04/11/24 0000 05/23/24 1244  NA 140   < > 139 140 141 140  K 3.7   < > 4.0 3.4* 3.8 3.5  CL 99  --  97* 102 102 101  CO2 32  --  28 30 27* 28  GLUCOSE 91  --  149* 97  --  127*  BUN 9  --  8 11 10 8   CALCIUM  9.7  --  9.5 9.5 9.5 9.4  CREATININE 0.63  --  0.75 0.63 0.7 0.81  GFRNONAA >60  --  >60 >60  --  >60   < > = values in this interval not displayed.    LIVER FUNCTION TESTS: Recent Labs    06/23/23 1823 12/01/23 1420 04/11/24 0000 05/23/24 1244  BILITOT 0.6 0.4  --  0.3  AST 23 25 22 25   ALT 27 22 21 22   ALKPHOS 68 66 74 85  PROT 7.0 6.3*  --  6.8  ALBUMIN 4.2 4.2 4.2 4.1    TUMOR MARKERS: No results for input(s): AFPTM, CEA, CA199, CHROMGRNA in the last 8760 hours.  Assessment and Plan: 83 y.o. female with past medical history significant for anemia, anxiety, asthma, chronic kidney disease, sleep apnea, depression, hyperlipidemia, hypertension, GERD, hypothyroidism, prior CVA 2023, obesity, and left breast cancer in 2018 with existing right chest wall Port-A-Cath.  Her Port-A-Cath is no longer needed and she  presents today for Port-A-Cath removal.  Details/risks of procedure, including but not limited to, internal bleeding, infection, injury to adjacent structures discussed with patient and daughter with their understanding and consent.   Thank you for allowing our service to participate in Jessica [name] 's care.  Electronically Signed: D. Franky Rakers, PA-C   06/09/2024, 8:48 AM      I spent a total of 20 minutes    in face to face in clinical consultation, greater than 50% of which was counseling/coordinating care for Port-A-Cath removal

## 2024-06-09 NOTE — Procedures (Signed)
 Interventional Radiology Procedure Note  Procedure: Port removal   Complications: None  Estimated Blood Loss: < 10 mL  Findings: RIJ SL chest port removed.  Sutures removed.  One hour bedrest   Cordella DELENA Banner, MD

## 2024-06-13 ENCOUNTER — Encounter: Payer: Self-pay | Admitting: Adult Health

## 2024-06-13 ENCOUNTER — Non-Acute Institutional Stay: Admitting: Adult Health

## 2024-06-13 DIAGNOSIS — K5901 Slow transit constipation: Secondary | ICD-10-CM

## 2024-06-13 DIAGNOSIS — K921 Melena: Secondary | ICD-10-CM | POA: Diagnosis not present

## 2024-06-13 DIAGNOSIS — K219 Gastro-esophageal reflux disease without esophagitis: Secondary | ICD-10-CM | POA: Diagnosis not present

## 2024-06-13 DIAGNOSIS — I10 Essential (primary) hypertension: Secondary | ICD-10-CM

## 2024-06-13 DIAGNOSIS — N390 Urinary tract infection, site not specified: Secondary | ICD-10-CM | POA: Diagnosis not present

## 2024-06-13 NOTE — Progress Notes (Signed)
 Location:  [facility]/gallery curator of Service:  ALF (13) Provider:  Medina-Vargas, Sameer Teeple, DNP, FNP-BC  Patient Care Team: Charlanne Fredia CROME, MD as PCP - General (Internal Medicine)  Extended Emergency Contact Information Primary Emergency Contact: Valora Darice [place], [place] Home Phone: [phone] Mobile Phone: [phone] Relation: Daughter Secondary Emergency Contact: Rodgers Tamera [place], [place] Home Phone: [phone] Mobile Phone: [phone] Relation: Daughter  Code Status:   DNR  Goals of care: Advanced Directive information    06/09/2024    7:45 AM  Advanced Directives  Does Patient Have a Medical Advance Directive? Yes  Type of Estate agent of [place];Out of facility DNR (pink MOST or yellow form)  Does patient want to make changes to medical advance directive? No - Patient declined  Copy of Healthcare Power of Attorney in Chart? No - copy requested     Chief Complaint  Patient presents with   Acute Visit    Blood in the stool    HPI:  Pt is a 83 y.o. female seen today for an acute visit. She is a resident of [facility] [address] [facility]. Staff reported that when she moved her bowels today, she has blood in the stool. Noted to have bright red blood, moderate amount, with formed stools in the bowl. She complained of abdominal pain. Abdomen is soft and was not tender to touch. She is not taking any blood thinner. She does not want to go to ER. No SOB. She was noted to have good appetite for breakfast. Staff reported that she asked if she has stool softener with her morning medications.  BP 138/87, takes hydrochlorothiazide  12.5 mg daily, Amlodipine  5 mg daily and Losartan  50 mg daily for hypertension  Patient stated that she has hemorrhoids. She takes Docusate 100 mg BID, Senna 8.6 mg Q HS and Lactulose  10 gm/15 ml give 45 ml Q 12 hours PRN for constipation.  She has  recurrent UTI for which she takes Cephalexin  250 mg daily.  She has GERD and takes Omeprazole 40 mg BID.   Past Medical History:  Diagnosis Date   Allergy    Anemia    Anxiety    Asthma    in past, no inhalers now   Blood transfusion without reported diagnosis    Breast cancer (HCC) 01/2017   left breast    Breast cancer of upper-inner quadrant of left female breast (HCC) 03/11/2017   Cancer (HCC) 01/2017   left breast   Cataract    bilateral removed   Chronic kidney disease    kidney stones   Depression 08/31/2013   Dyslipidemia, goal LDL below 160 08/31/2013   Essential hypertension - well controlled 08/31/2013   Family history of breast cancer    Family history of melanoma    Family history of prostate cancer    GERD (gastroesophageal reflux disease)    Goals of care, counseling/discussion 03/11/2017   History of radiation therapy 10/13/17-11/11/17   left breast 40.05 Gy in 15 fractions, left breast boost 10 Gy in 5 fractions   Hypothyroidism    Ischemic stroke [facility])    May 2023;   Obesity (BMI 30-39.9) 08/31/2013   Personal history of chemotherapy 2018   Personal history of radiation therapy 2019   Thyroid  disease    Vasovagal near-syncope -rare 08/31/2013   Past Surgical History:  Procedure Laterality Date   ABDOMINAL HYSTERECTOMY     total   BREAST BIOPSY Left 02/24/2017    malignant   BREAST LUMPECTOMY Left 03/23/2017   broken fingers     CATARACT EXTRACTION Bilateral    COLONOSCOPY     COLONOSCOPY WITH PROPOFOL  N/A 06/03/2022   Procedure: COLONOSCOPY WITH PROPOFOL ;  Surgeon: Stacia Glendia BRAVO, MD;  Location: [facility];  Service: Gastroenterology;  Laterality: N/A;   ESOPHAGOGASTRODUODENOSCOPY (EGD) WITH PROPOFOL  N/A 05/29/2022   Procedure: ESOPHAGOGASTRODUODENOSCOPY (EGD) WITH PROPOFOL ;  Surgeon: Albertus Gordy HERO, MD;  Location: [facility];  Service: Gastroenterology;  Laterality: N/A;   EXCISIONAL HEMORRHOIDECTOMY     IR FLUORO GUIDE PORT INSERTION  RIGHT  08/04/2017   IR REMOVAL TUN ACCESS W/ PORT W/O FL MOD SED  08/04/2017   IR REMOVAL TUN ACCESS W/ PORT W/O FL MOD SED  06/09/2024   IR US  GUIDE VASC ACCESS RIGHT  08/04/2017   IRRIGATION AND DEBRIDEMENT ABSCESS Left 05/18/2017   Procedure: IRRIGATION AND DEBRIDEMENT LEFT AXILLARY ABSCESS;  Surgeon: Sheldon Standing, MD;  Location: [facility];  Service: General;  Laterality: Left;   kidney stones lithotripsy     POLYPECTOMY  06/03/2022   Procedure: POLYPECTOMY;  Surgeon: Stacia Glendia BRAVO, MD;  Location: [facility];  Service: Gastroenterology;;   TY PLACEMENT Right 03/23/2017   Procedure: INSERTION PORT-A-CATH;  Surgeon: Vanderbilt Ned, MD;  Location: [facility];  Service: General;  Laterality: Right;   RADIOACTIVE SEED GUIDED PARTIAL MASTECTOMY WITH AXILLARY SENTINEL LYMPH NODE BIOPSY Left 03/23/2017   Procedure: LEFT BREAST RADIOACTIVE SEED GUIDED PARTIAL MASTECTOMY AND  SENTINEL LYMPH NODE MAPPING;  Surgeon: Vanderbilt Ned, MD;  Location: [facility];  Service: General;  Laterality: Left;   ROTATOR CUFF REPAIR     left    TONSILLECTOMY     WISDOM TOOTH EXTRACTION      Allergies  Allergen Reactions   Benazepril  Anaphylaxis    Possible cause of tongue swelling   Penicillins Anaphylaxis    Has patient had a PCN reaction causing immediate rash, facial/tongue/throat swelling, SOB or lightheadedness with hypotension: yes, Has patient had a PCN reaction causing severe rash involving mucus membranes or skin necrosis:  no, Has patient had a PCN reaction that required hospitalization:  no, Has patient had a PCN reaction occurring within the last 10 years: no, If all of the above answers are NO, then may proceed with Cephalosporin use.   Advil [Ibuprofen] Itching and Rash    Rash that turns to blisters   Codeine Hives    hives   Crestor [Rosuvastatin Calcium ] Other (See Comments)    Pain in joints   Sulfamethoxazole-Trimethoprim Hives    hives    Bacitracin-Polymyxin B Dermatitis and Rash   Other Rash    Band-aid    Outpatient Encounter Medications as of 06/13/2024  Medication Sig   acetaminophen  (TYLENOL ) 500 MG tablet Take 500 mg by mouth every 6 (six) hours as needed.   amLODipine  (NORVASC ) 5 MG tablet Take 5 mg by mouth daily.   aspirin  81 MG chewable tablet Chew 1 tablet (81 mg total) by mouth daily.   atorvastatin  (LIPITOR ) 80 MG tablet Take 1 tablet (80 mg total) by mouth at bedtime.   cephALEXin  (KEFLEX ) 250 MG capsule Take 250 mg by mouth daily.   cholecalciferol  (VITAMIN D3) 25 MCG (1000 UNIT) tablet Take 1 tablet (1,000 Units total) by mouth daily.   ciclopirox  (PENLAC ) 8 % solution APPLY TOPICALLY TO AFFECTED NAIL(S) AT  BEDTIME. APPLY OVER NAIL & SURROUNDING SKIN. APPLY DAILY OVER PREVIOUS COAT. **AFTER 7 DAYS, MAY REMOVE WITH ALCOHOL  AND CONTINUE CYCLE.**   CRANBERRY PO Take 2 Pieces by mouth daily. Give 2 gummy by mouth for Prophylaxis.   Docusate Sodium  100 MG capsule Take 100 mg by mouth 2 (two) times daily.   ferrous sulfate  325 (65 FE) MG tablet Take 1 tablet (325 mg total) by mouth every Monday, Wednesday, and Friday.   fexofenadine (ALLEGRA) 180 MG tablet Take 180 mg by mouth daily.   gabapentin  (NEURONTIN ) 100 MG capsule Take 100 mg by mouth daily.   hydrochlorothiazide  (HYDRODIURIL ) 12.5 MG tablet Take 12.5 mg by mouth daily.   L-Lysine 1000 MG TABS Take 1,000 mg by mouth as needed. As needed for fever blister   lactulose  (CHRONULAC ) 10 GM/15ML solution Take 45 mLs by mouth every 12 (twelve) hours as needed for mild constipation.   levothyroxine  (SYNTHROID ) 75 MCG tablet Take 1 tablet (75 mcg total) by mouth daily.   losartan  (COZAAR ) 50 MG tablet Take 50 mg by mouth daily.   Magnesium  200 MG TABS Take 200 mg by mouth daily.   Multiple Vitamins-Minerals (CENTRUM ADULTS MULTIGUMMIES PO) Take 2 each by mouth daily.   nystatin  powder Apply 1 Application topically daily.   omeprazole (PRILOSEC) 40 MG capsule Take  40 mg by mouth 2 (two) times daily.   potassium chloride  (MICRO-K ) 10 MEQ CR capsule Take 10 mEq by mouth daily.   senna (SENOKOT) 8.6 MG TABS tablet Take 2 tablets (17.2 mg total) by mouth at bedtime.   venlafaxine  XR (EFFEXOR -XR) 75 MG 24 hr capsule Take 3 capsules (225 mg total) by mouth daily.   Vitamins A & D (VITAMIN A & D) ointment Apply 1 Application topically as needed for dry skin.   No facility-administered encounter medications on file as of 06/13/2024.    Review of Systems  Constitutional:  Negative for appetite change, chills, fatigue and fever.  HENT:  Negative for congestion, hearing loss, rhinorrhea and sore throat.   Eyes: Negative.   Respiratory:  Negative for cough, shortness of breath and wheezing.   Cardiovascular:  Negative for chest pain, palpitations and leg swelling.  Gastrointestinal:  Positive for abdominal pain, blood in stool and constipation. Negative for abdominal distention, diarrhea, nausea and vomiting.  Genitourinary:  Negative for dysuria.  Musculoskeletal:  Negative for arthralgias, back pain and myalgias.  Skin:  Negative for color change, rash and wound.  Neurological:  Negative for dizziness, weakness and headaches.  Psychiatric/Behavioral:  Negative for behavioral problems. The patient is not nervous/anxious.      Immunization History  Administered Date(s) Administered   DTaP 07/10/2022   Influenza Split 08/13/2017   Influenza, High Dose Seasonal PF 09/03/2015, 08/05/2016, 08/31/2018, 07/26/2019   Influenza, Quadrivalent, Recombinant, Inj, Pf 08/31/2018, 07/26/2019   Influenza,trivalent, recombinat, inj, PF 08/31/2018   Influenza-Unspecified 08/27/2012, 09/05/2013, 08/26/2016   PFIZER(Purple Top)SARS-COV-2 Vaccination 11/27/2019, 12/18/2019   PNEUMOCOCCAL CONJUGATE-20 02/05/2022   Pneumococcal Conjugate-13 10/05/2013, 07/26/2019   Pneumococcal Polysaccharide-23 03/25/2003   Td 03/25/2003   Tdap 03/25/2003   Zoster Recombinant(Shingrix)  12/27/2018, 07/26/2019   Pertinent  Health Maintenance Due  Topic Date Due   DEXA SCAN  Never done   INFLUENZA VACCINE  06/02/2024   Colonoscopy  06/03/2025      01/12/2023    2:11 PM 07/23/2023    2:56 PM 10/06/2023    4:20 PM 04/07/2024    3:55 PM 06/07/2024    4:05 PM  Fall  Risk  Falls in the past year? 0 1 1 0 0  Was there an injury with Fall? 0 1 0 0 0  Fall Risk Category Calculator 0 3 1 0 0  Patient at Risk for Falls Due to  History of fall(s);Impaired balance/gait;Impaired mobility History of fall(s);Impaired balance/gait;Impaired mobility History of fall(s) History of fall(s)  Fall risk Follow up  Falls evaluation completed;Education provided;Falls prevention discussed Falls evaluation completed;Education provided;Falls prevention discussed Falls evaluation completed;Education provided Falls evaluation completed;Education provided     Vitals:   06/13/24 0953  BP: 138/87  Pulse: 79  Resp: 18  Temp: 98.2 F (36.8 C)  SpO2: 94%  Weight: 190 lb 12.8 oz (86.5 kg)  Height: 5' 1.5 (1.562 m)   Body mass index is 35.47 kg/m.  Physical Exam Constitutional:      General: She is not in acute distress.    Appearance: Normal appearance. She is obese.  HENT:     Head: Normocephalic and atraumatic.     Nose: Nose normal.     Mouth/Throat:     Mouth: Mucous membranes are moist.  Eyes:     Conjunctiva/sclera: Conjunctivae normal.  Cardiovascular:     Rate and Rhythm: Normal rate and regular rhythm.  Pulmonary:     Effort: Pulmonary effort is normal.     Breath sounds: Normal breath sounds.  Abdominal:     General: Bowel sounds are normal.     Palpations: Abdomen is soft.  Musculoskeletal:     Cervical back: Normal range of motion.  Skin:    General: Skin is warm and dry.     Comments: S/P removal of porta cath on right chest, dry  Neurological:     Mental Status: She is alert.  Psychiatric:        Mood and Affect: Mood normal.        Behavior: Behavior normal.         Thought Content: Thought content normal.        Judgment: Judgment normal.        Labs reviewed: Recent Labs    06/24/23 1640 12/01/23 1420 04/11/24 0000 05/23/24 1244  NA 139 140 141 140  K 4.0 3.4* 3.8 3.5  CL 97* 102 102 101  CO2 28 30 27* 28  GLUCOSE 149* 97  --  127*  BUN 8 11 10 8   CREATININE 0.75 0.63 0.7 0.81  CALCIUM  9.5 9.5 9.5 9.4   Recent Labs    06/23/23 1823 12/01/23 1420 04/11/24 0000 05/23/24 1244  AST 23 25 22 25   ALT 27 22 21 22   ALKPHOS 68 66 74 85  BILITOT 0.6 0.4  --  0.3  PROT 7.0 6.3*  --  6.8  ALBUMIN 4.2 4.2 4.2 4.1   Recent Labs    06/25/23 0553 12/01/23 1420 04/11/24 0000 05/23/24 1244  WBC 4.9 6.1 6.6 5.9  NEUTROABS  --  4.2 4,204.00 3.6  HGB 12.7 11.6* 12.7 12.2  HCT 39.0 35.4* 38 37.7  MCV 101.0* 100.6*  --  100.0  PLT 224 266 288 248   Lab Results  Component Value Date   TSH 0.567 06/23/2023   Lab Results  Component Value Date   HGBA1C 5.6 03/21/2022   Lab Results  Component Value Date   CHOL 297 (H) 03/21/2022   HDL 49 03/21/2022   LDLCALC 216 (H) 03/21/2022   TRIG 161 (H) 03/21/2022   CHOLHDL 6.1 03/21/2022    Significant Diagnostic Results in last 30  days:  IR Removal Tun Access W/ Port W/O FL Result Date: 06/09/2024 INDICATION: Therapy complete. Planned removal of a right internal jugular vein single lumen chest port. EXAM: REMOVAL RIGHT IJ VEIN PORT-A-CATH MEDICATIONS: None; The antibiotic was administered within an appropriate time interval prior to skin puncture. ANESTHESIA/SEDATION: Moderate (conscious) sedation was employed during this procedure. A total of Versed  0.5 mg and Fentanyl  25 mcg was administered intravenously by the radiology nurse. Total intra-service moderate Sedation Time: 14 minutes. The patient's level of consciousness and vital signs were monitored continuously by radiology nursing throughout the procedure under my direct supervision. FLUOROSCOPY: Radiation Exposure Index (as provided by the  fluoroscopic device): None mGy Kerma COMPLICATIONS: None immediate. PROCEDURE: Informed written consent was obtained from the patient's power of attorney after a thorough discussion of the procedural risks, benefits and alternatives. All questions were addressed. Maximal Sterile Barrier Technique was utilized including caps, mask, sterile gowns, sterile gloves, sterile drape, hand hygiene and skin antiseptic. A timeout was performed prior to the initiation of the procedure. The right chest was prepped and draped in a sterile fashion. Lidocaine  was utilized for local anesthesia. An incision was made over the previously healed surgical incision. Utilizing blunt dissection, the port catheter and reservoir were removed from the underlying subcutaneous tissue in their entirety. Securing sutures were also removed. The pocket was irrigated with a copious amount of sterile normal saline. The pocket was closed with interrupted 3-0 Vicryl stitches. The subcutaneous tissue was closed with 3-0 Vicryl interrupted subcutaneous stitches. A 4-0 Vicryl running subcuticular stitch was utilized to approximate the skin. Dermabond was applied. IMPRESSION: Successful right IJ vein Port-A-Cath explant. Electronically Signed   By: Cordella Banner   On: 06/09/2024 10:27    Assessment/Plan  1. Hematochezia (Primary) -  bright red blood with formed stool -  will get stat CBC and BMP -  monitor for recurrence of blood in stool -   continue Omeprazole 40 mg BID  2. Primary hypertension -  BP 138/87, stable -  continue hydrochlorothiazide  12.5 mg daily -  continue Amlodipine  5 mg daily -  continue Losartan  50 mg daily  3. Recurrent UTI -  continue Cephalexin  250 mg daily -  check UA with CS  4. Gastroesophageal reflux disease without esophagitis -  continue Omeprazole 40 mg BID  5. Slow transit constipation -  staff reported that she is constipated -  continue Docusate 100 mg BID -  continue Senna 8.6 mg 2 tabs Q  HS -  continue Lactulose  10 gm/15 ml give 45 ml Q 12 hours PRN   Family/ staff Communication: Discussed plan of care with resident and charge nurse.  Labs/tests ordered:  CBC and BMP and UA with CS    Ameer Sanden Medina-Vargas, DNP, MSN, FNP-BC [facility] [phone] (Monday-Friday 8:00 a.m. - 5:00 p.m.) [phone] (after hours)

## 2024-06-19 ENCOUNTER — Non-Acute Institutional Stay: Admitting: Orthopedic Surgery

## 2024-06-19 ENCOUNTER — Encounter: Payer: Self-pay | Admitting: Orthopedic Surgery

## 2024-06-19 DIAGNOSIS — K921 Melena: Secondary | ICD-10-CM | POA: Diagnosis not present

## 2024-06-19 DIAGNOSIS — R410 Disorientation, unspecified: Secondary | ICD-10-CM

## 2024-06-19 DIAGNOSIS — R208 Other disturbances of skin sensation: Secondary | ICD-10-CM

## 2024-06-19 NOTE — Progress Notes (Signed)
 Location:   [facility]  Nursing Home Room Number: 18-A Place of Service:  [phone]) Provider:  Greig Cluster, NP  PCP: Charlanne Fredia CROME, MD  Patient Care Team: Charlanne Fredia CROME, MD as PCP - General (Internal Medicine)  Extended Emergency Contact Information Primary Emergency Contact: Valora Darice MORITA, [place] Home Phone: [phone] Mobile Phone: [phone] Relation: Daughter Secondary Emergency Contact: Rodgers Tamera M[place]ITA, [place] Home Phone: [phone] Mobile Phone: [phone] Relation: Daughter  Code Status:  DNR Goals of care: Advanced Directive information    06/19/2024   11:42 AM  Advanced Directives  Does Patient Have a Medical Advance Directive? Yes  Type of Estate agent of [place];Out of facility DNR (pink MOST or yellow form)  Does patient want to make changes to medical advance directive? No - Patient declined  Copy of Healthcare Power of Attorney in Chart? Yes - validated most recent copy scanned in chart (See row information)     Chief Complaint  Patient presents with   Hemorrhoids    HPI:  Pt is a 83 y.o. female seen today for acute visit due to hemorrhoids.   She currently resides on the assisted living unit at [facility]. PMH: HTN, HLD, asthma, OSA, GERD, hypothyroidism, acute encephalopathy (hospitalized 0821/2024), left thalamic infarction, osteopenia, depression, iron  deficiency anemia, and obesity.   08/12 noted with hematochezia. Blood work was recommended but refused by patient. She did have blood work 08/07 due to confusion. Hgb was 12.3. H/o hemorrhoids. She reports burning sensation to skin around rectum and gluteal fold. She is currently taking colace and senna for hemorrhoids. Past use of anusol  prn. She denies any signs of rectal bleeding at this time. Afebrile. Vital stable.   08/06 noted with increased confusion. Cbc/diff, bmp,CXR  unremarkable. Appears at baseline today.   No mood changes, remains on Effexor .    Past Medical History:  Diagnosis Date   Allergy    Anemia    Anxiety    Asthma    in past, no inhalers now   Blood transfusion without reported diagnosis    Breast cancer (HCC) 01/2017   left breast    Breast cancer of upper-inner quadrant of left female breast (HCC) 03/11/2017   Cancer (HCC) 01/2017   left breast   Cataract    bilateral removed   Chronic kidney disease    kidney stones   Depression 08/31/2013   Dyslipidemia, goal LDL below 160 08/31/2013   Essential hypertension - well controlled 08/31/2013   Family history of breast cancer    Family history of melanoma    Family history of prostate cancer    GERD (gastroesophageal reflux disease)    Goals of care, counseling/discussion 03/11/2017   History of radiation therapy 10/13/17-11/11/17   left breast 40.05 Gy in 15 fractions, left breast boost 10 Gy in 5 fractions   Hypothyroidism    Ischemic stroke [facility])    May 2023;   Obesity (BMI 30-39.9) 08/31/2013   Personal history of chemotherapy 2018   Personal history of radiation therapy 2019   Thyroid  disease    Vasovagal near-syncope -rare 08/31/2013   Past Surgical History:  Procedure Laterality Date   ABDOMINAL HYSTERECTOMY     total   BREAST BIOPSY Left 02/24/2017    malignant   BREAST LUMPECTOMY Left 03/23/2017   broken fingers  CATARACT EXTRACTION Bilateral    COLONOSCOPY     COLONOSCOPY WITH PROPOFOL  N/A 06/03/2022   Procedure: COLONOSCOPY WITH PROPOFOL ;  Surgeon: Stacia Glendia BRAVO, MD;  Location: [facility];  Service: Gastroenterology;  Laterality: N/A;   ESOPHAGOGASTRODUODENOSCOPY (EGD) WITH PROPOFOL  N/A 05/29/2022   Procedure: ESOPHAGOGASTRODUODENOSCOPY (EGD) WITH PROPOFOL ;  Surgeon: Albertus Gordy HERO, MD;  Location: [facility];  Service: Gastroenterology;  Laterality: N/A;   EXCISIONAL HEMORRHOIDECTOMY     IR FLUORO GUIDE PORT INSERTION RIGHT  08/04/2017   IR  REMOVAL TUN ACCESS W/ PORT W/O FL MOD SED  08/04/2017   IR REMOVAL TUN ACCESS W/ PORT W/O FL MOD SED  06/09/2024   IR US  GUIDE VASC ACCESS RIGHT  08/04/2017   IRRIGATION AND DEBRIDEMENT ABSCESS Left 05/18/2017   Procedure: IRRIGATION AND DEBRIDEMENT LEFT AXILLARY ABSCESS;  Surgeon: Sheldon Standing, MD;  Location: [facility];  Service: General;  Laterality: Left;   kidney stones lithotripsy     POLYPECTOMY  06/03/2022   Procedure: POLYPECTOMY;  Surgeon: Stacia Glendia BRAVO, MD;  Location: [facility];  Service: Gastroenterology;;   TY PLACEMENT Right 03/23/2017   Procedure: INSERTION PORT-A-CATH;  Surgeon: Vanderbilt Ned, MD;  Location: [facility];  Service: General;  Laterality: Right;   RADIOACTIVE SEED GUIDED PARTIAL MASTECTOMY WITH AXILLARY SENTINEL LYMPH NODE BIOPSY Left 03/23/2017   Procedure: LEFT BREAST RADIOACTIVE SEED GUIDED PARTIAL MASTECTOMY AND  SENTINEL LYMPH NODE MAPPING;  Surgeon: Vanderbilt Ned, MD;  Location: [facility];  Service: General;  Laterality: Left;   ROTATOR CUFF REPAIR     left    TONSILLECTOMY     WISDOM TOOTH EXTRACTION      Allergies  Allergen Reactions   Benazepril  Anaphylaxis    Possible cause of tongue swelling   Penicillins Anaphylaxis    Has patient had a PCN reaction causing immediate rash, facial/tongue/throat swelling, SOB or lightheadedness with hypotension: yes, Has patient had a PCN reaction causing severe rash involving mucus membranes or skin necrosis:  no, Has patient had a PCN reaction that required hospitalization:  no, Has patient had a PCN reaction occurring within the last 10 years: no, If all of the above answers are NO, then may proceed with Cephalosporin use.   Advil [Ibuprofen] Itching and Rash    Rash that turns to blisters   Codeine Hives    hives   Crestor [Rosuvastatin Calcium ] Other (See Comments)    Pain in joints   Sulfamethoxazole-Trimethoprim Hives    hives   Bacitracin-Polymyxin B Dermatitis and  Rash   Other Rash    Band-aid    Allergies as of 06/19/2024       Reactions   Benazepril  Anaphylaxis   Possible cause of tongue swelling   Penicillins Anaphylaxis   Has patient had a PCN reaction causing immediate rash, facial/tongue/throat swelling, SOB or lightheadedness with hypotension: yes, Has patient had a PCN reaction causing severe rash involving mucus membranes or skin necrosis:  no, Has patient had a PCN reaction that required hospitalization:  no, Has patient had a PCN reaction occurring within the last 10 years: no, If all of the above answers are NO, then may proceed with Cephalosporin use.   Advil [ibuprofen] Itching, Rash   Rash that turns to blisters   Codeine Hives   hives   Crestor [rosuvastatin Calcium ] Other (See Comments)   Pain in joints   Sulfamethoxazole-trimethoprim Hives   hives   Bacitracin-polymyxin B Dermatitis, Rash   Other Rash   Band-aid  Medication List        Accurate as of June 19, 2024 11:43 AM. If you have any questions, ask your nurse or doctor.          acetaminophen  500 MG tablet Commonly known as: TYLENOL  Take 500 mg by mouth every 6 (six) hours as needed.   amLODipine  5 MG tablet Commonly known as: NORVASC  Take 5 mg by mouth daily.   aspirin  81 MG chewable tablet Chew 1 tablet (81 mg total) by mouth daily.   atorvastatin  80 MG tablet Commonly known as: LIPITOR  Take 1 tablet (80 mg total) by mouth at bedtime.   CENTRUM ADULTS MULTIGUMMIES PO Take 2 each by mouth daily.   cephALEXin  250 MG capsule Commonly known as: KEFLEX  Take 250 mg by mouth daily.   cholecalciferol  25 MCG (1000 UNIT) tablet Commonly known as: VITAMIN D3 Take 1 tablet (1,000 Units total) by mouth daily.   ciclopirox  8 % solution Commonly known as: PENLAC  APPLY TOPICALLY TO AFFECTED NAIL(S) AT BEDTIME. APPLY OVER NAIL & SURROUNDING SKIN. APPLY DAILY OVER PREVIOUS COAT. **AFTER 7 DAYS, MAY REMOVE WITH ALCOHOL  AND CONTINUE CYCLE.**    CRANBERRY PO Take 2 Pieces by mouth daily. Give 2 gummy by mouth for Prophylaxis.   Docusate Sodium  100 MG capsule Take 100 mg by mouth 2 (two) times daily.   ferrous sulfate  325 (65 FE) MG tablet Take 1 tablet (325 mg total) by mouth every Monday, Wednesday, and Friday.   fexofenadine 180 MG tablet Commonly known as: ALLEGRA Take 180 mg by mouth daily.   gabapentin  100 MG capsule Commonly known as: NEURONTIN  Take 100 mg by mouth daily.   hydrochlorothiazide  12.5 MG tablet Commonly known as: HYDRODIURIL  Take 12.5 mg by mouth daily.   L-Lysine 1000 MG Tabs Take 1,000 mg by mouth as needed. As needed for fever blister   lactulose  10 GM/15ML solution Commonly known as: CHRONULAC  Take 45 mLs by mouth every 12 (twelve) hours as needed for mild constipation.   levothyroxine  75 MCG tablet Commonly known as: SYNTHROID  Take 1 tablet (75 mcg total) by mouth daily.   losartan  50 MG tablet Commonly known as: COZAAR  Take 50 mg by mouth daily.   Magnesium  200 MG Tabs Take 200 mg by mouth daily.   nystatin  powder Apply 1 Application topically daily.   omeprazole 40 MG capsule Commonly known as: PRILOSEC Take 40 mg by mouth 2 (two) times daily.   potassium chloride  10 MEQ CR capsule Commonly known as: MICRO-K  Take 10 mEq by mouth daily.   senna 8.6 MG Tabs tablet Commonly known as: SENOKOT Take 2 tablets (17.2 mg total) by mouth at bedtime.   venlafaxine  XR 75 MG 24 hr capsule Commonly known as: EFFEXOR -XR Take 3 capsules (225 mg total) by mouth daily.   vitamin A & D ointment Apply 1 Application topically as needed for dry skin.        Review of Systems  HENT: Negative.    Cardiovascular: Negative.   Gastrointestinal:  Positive for anal bleeding, constipation and rectal pain. Negative for abdominal pain and diarrhea.  Genitourinary: Negative.   Musculoskeletal: Negative.   Skin: Negative.   Neurological: Negative.   Psychiatric/Behavioral:  Positive for  confusion and dysphoric mood. The patient is not nervous/anxious.     Immunization History  Administered Date(s) Administered   DTaP 07/10/2022   Influenza Split 08/13/2017   Influenza, High Dose Seasonal PF 09/03/2015, 08/05/2016, 08/31/2018, 07/26/2019   Influenza, Quadrivalent, Recombinant, Inj, Pf 08/31/2018, 07/26/2019  Influenza,trivalent, recombinat, inj, PF 08/31/2018   Influenza-Unspecified 08/27/2012, 09/05/2013, 08/26/2016   PFIZER(Purple Top)SARS-COV-2 Vaccination 11/27/2019, 12/18/2019   PNEUMOCOCCAL CONJUGATE-20 02/05/2022   Pneumococcal Conjugate-13 10/05/2013, 07/26/2019   Pneumococcal Polysaccharide-23 03/25/2003   Td 03/25/2003   Tdap 03/25/2003   Zoster Recombinant(Shingrix) 12/27/2018, 07/26/2019   Pertinent  Health Maintenance Due  Topic Date Due   DEXA SCAN  Never done   INFLUENZA VACCINE  06/02/2024   Colonoscopy  06/03/2025      01/12/2023    2:11 PM 07/23/2023    2:56 PM 10/06/2023    4:20 PM 04/07/2024    3:55 PM 06/07/2024    4:05 PM  Fall Risk  Falls in the past year? 0 1 1 0 0  Was there an injury with Fall? 0 1 0 0 0  Fall Risk Category Calculator 0 3 1 0 0  Patient at Risk for Falls Due to  History of fall(s);Impaired balance/gait;Impaired mobility History of fall(s);Impaired balance/gait;Impaired mobility History of fall(s) History of fall(s)  Fall risk Follow up  Falls evaluation completed;Education provided;Falls prevention discussed Falls evaluation completed;Education provided;Falls prevention discussed Falls evaluation completed;Education provided Falls evaluation completed;Education provided   Functional Status Survey:    Vitals:   06/19/24 1137  BP: 120/86  Pulse: 70  Resp: 18  Temp: 97.7 F (36.5 C)  SpO2: 98%  Weight: 190 lb 12.8 oz (86.5 kg)  Height: 5' 1.5 (1.562 m)   Body mass index is 35.47 kg/m. Physical Exam Vitals reviewed. Exam conducted with a chaperone present.  Constitutional:      General: She is not in acute  distress. HENT:     Head: Normocephalic.  Eyes:     General:        Right eye: No discharge.        Left eye: No discharge.  Cardiovascular:     Rate and Rhythm: Normal rate and regular rhythm.     Pulses: Normal pulses.     Heart sounds: Normal heart sounds.  Pulmonary:     Effort: Pulmonary effort is normal.     Breath sounds: Normal breath sounds.  Abdominal:     Palpations: Abdomen is soft.  Genitourinary:    Rectum: External hemorrhoid present.     Comments: Skin around rectum excoriated, small external hemorrhoid present  Musculoskeletal:     Cervical back: Neck supple.     Right lower leg: No edema.     Left lower leg: No edema.  Skin:    General: Skin is warm.     Capillary Refill: Capillary refill takes less than 2 seconds.  Neurological:     General: No focal deficit present.     Mental Status: She is alert. Mental status is at baseline.     Motor: Weakness present.     Gait: Gait abnormal.  Psychiatric:        Mood and Affect: Mood normal.     Labs reviewed: Recent Labs    06/24/23 1640 12/01/23 1420 04/11/24 0000 05/23/24 1244 06/08/24 0000  NA 139 140 141 140 139  K 4.0 3.4* 3.8 3.5 4.1  CL 97* 102 102 101 101  CO2 28 30 27* 28 29*  GLUCOSE 149* 97  --  127*  --   BUN 8 11 10 8 9   CREATININE 0.75 0.63 0.7 0.81 0.7  CALCIUM  9.5 9.5 9.5 9.4 9.6   Recent Labs    06/23/23 1823 12/01/23 1420 04/11/24 0000 05/23/24 1244  AST 23 25 22  25  ALT 27 22 21 22   ALKPHOS 68 66 74 85  BILITOT 0.6 0.4  --  0.3  PROT 7.0 6.3*  --  6.8  ALBUMIN 4.2 4.2 4.2 4.1   Recent Labs    06/25/23 0553 12/01/23 1420 04/11/24 0000 05/23/24 1244 06/08/24 0000  WBC 4.9 6.1 6.6 5.9 5.3  NEUTROABS  --  4.2 4,204.00 3.6 2,979.00  HGB 12.7 11.6* 12.7 12.2 12.3  HCT 39.0 35.4* 38 37.7 37  MCV 101.0* 100.6*  --  100.0  --   PLT 224 266 288 248 193   Lab Results  Component Value Date   TSH 0.567 06/23/2023   Lab Results  Component Value Date   HGBA1C 5.6  03/21/2022   Lab Results  Component Value Date   CHOL 297 (H) 03/21/2022   HDL 49 03/21/2022   LDLCALC 216 (H) 03/21/2022   TRIG 161 (H) 03/21/2022   CHOLHDL 6.1 03/21/2022    Significant Diagnostic Results in last 30 days:  IR Removal Tun Access W/ Port W/O FL Result Date: 06/09/2024 INDICATION: Therapy complete. Planned removal of a right internal jugular vein single lumen chest port. EXAM: REMOVAL RIGHT IJ VEIN PORT-A-CATH MEDICATIONS: None; The antibiotic was administered within an appropriate time interval prior to skin puncture. ANESTHESIA/SEDATION: Moderate (conscious) sedation was employed during this procedure. A total of Versed  0.5 mg and Fentanyl  25 mcg was administered intravenously by the radiology nurse. Total intra-service moderate Sedation Time: 14 minutes. The patient's level of consciousness and vital signs were monitored continuously by radiology nursing throughout the procedure under my direct supervision. FLUOROSCOPY: Radiation Exposure Index (as provided by the fluoroscopic device): None mGy Kerma COMPLICATIONS: None immediate. PROCEDURE: Informed written consent was obtained from the patient's power of attorney after a thorough discussion of the procedural risks, benefits and alternatives. All questions were addressed. Maximal Sterile Barrier Technique was utilized including caps, mask, sterile gowns, sterile gloves, sterile drape, hand hygiene and skin antiseptic. A timeout was performed prior to the initiation of the procedure. The right chest was prepped and draped in a sterile fashion. Lidocaine  was utilized for local anesthesia. An incision was made over the previously healed surgical incision. Utilizing blunt dissection, the port catheter and reservoir were removed from the underlying subcutaneous tissue in their entirety. Securing sutures were also removed. The pocket was irrigated with a copious amount of sterile normal saline. The pocket was closed with interrupted 3-0  Vicryl stitches. The subcutaneous tissue was closed with 3-0 Vicryl interrupted subcutaneous stitches. A 4-0 Vicryl running subcuticular stitch was utilized to approximate the skin. Dermabond was applied. IMPRESSION: Successful right IJ vein Port-A-Cath explant. Electronically Signed   By: Cordella Banner   On: 06/09/2024 10:27    Assessment/Plan: 1. Burning sensation of rectum (Primary) - skin appears excoriated - suspect from mixture of poor hygiene and vigorous wiping - start petroleum jelly- apply to gluteal folds/skin around rectum x 5 days  2. Confusion - noted 08/06> lab work and CXR unremarkable - recommend ST evaluation for cognitive testing if cognition worsens  3. Hematochezia - noted 08/12 - no other episodes  - recent hgb was 12.3 08/07 - cont omeprazole      Family/ staff Communication: plan discussed with patient and nurse  Labs/tests ordered:  none

## 2024-06-20 DIAGNOSIS — L820 Inflamed seborrheic keratosis: Secondary | ICD-10-CM | POA: Diagnosis not present

## 2024-07-26 ENCOUNTER — Non-Acute Institutional Stay: Admitting: Orthopedic Surgery

## 2024-07-26 ENCOUNTER — Encounter: Payer: Self-pay | Admitting: Orthopedic Surgery

## 2024-07-26 DIAGNOSIS — Z0000 Encounter for general adult medical examination without abnormal findings: Secondary | ICD-10-CM | POA: Diagnosis not present

## 2024-07-26 NOTE — Progress Notes (Signed)
 Subjective:   Jessica [name] is a 83 y.o. female who presents for Medicare Annual (Subsequent) preventive examination.  Visit Complete: In person  Patient Medicare AWV questionnaire was completed by the patient on 07/26/2024; I have confirmed that all information answered by patient is correct and no changes since this date.  Cardiac Risk Factors include: advanced age (>4men, >70 women);obesity (BMI >30kg/m2);sedentary lifestyle;hypertension;dyslipidemia     Objective:    Today's Vitals   07/26/24 1039  BP: 115/75  Pulse: 67  Resp: 16  Temp: 97.7 F (36.5 C)  SpO2: 91%  Weight: 190 lb 12.8 oz (86.5 kg)  Height: 5' 1.5 (1.562 m)   Body mass index is 35.47 kg/m.     06/19/2024   11:42 AM 06/09/2024    7:45 AM 06/07/2024    2:41 PM 05/23/2024   12:58 PM 05/23/2024   12:38 PM 05/08/2024    1:07 PM 12/01/2023    3:02 PM  Advanced Directives  Does Patient Have a Medical Advance Directive? Yes Yes Yes Yes Yes Yes Yes  Type of Estate agent of [place];Out of facility DNR (pink MOST or yellow form) Healthcare Power of [place];Out of facility DNR (pink MOST or yellow form) Healthcare Power of [place];Out of facility DNR (pink MOST or yellow form)  Living will;Healthcare Power of [employer] of [place];Out of facility DNR (pink MOST or yellow form) Healthcare Power of [place];Living will  Does patient want to make changes to medical advance directive? No - Patient declined No - Patient declined No - Patient declined No - Patient declined No - Patient declined No - Patient declined No - Patient declined  Copy of Healthcare Power of Attorney in Chart? Yes - validated most recent copy scanned in chart (See row information) No - copy requested Yes - validated most recent copy scanned in chart (See row information)   Yes - validated most recent copy scanned in chart (See row information) No - copy requested  Would patient like information on creating a  medical advance directive?       No - Patient declined    Current Medications (verified) Outpatient Encounter Medications as of 07/26/2024  Medication Sig   acetaminophen  (TYLENOL ) 500 MG tablet Take 500 mg by mouth every 6 (six) hours as needed.   amLODipine  (NORVASC ) 5 MG tablet Take 5 mg by mouth daily.   aspirin  81 MG chewable tablet Chew 1 tablet (81 mg total) by mouth daily.   atorvastatin  (LIPITOR ) 80 MG tablet Take 1 tablet (80 mg total) by mouth at bedtime.   cephALEXin  (KEFLEX ) 250 MG capsule Take 250 mg by mouth daily.   cholecalciferol  (VITAMIN D3) 25 MCG (1000 UNIT) tablet Take 1 tablet (1,000 Units total) by mouth daily.   ciclopirox  (PENLAC ) 8 % solution APPLY TOPICALLY TO AFFECTED NAIL(S) AT BEDTIME. APPLY OVER NAIL & SURROUNDING SKIN. APPLY DAILY OVER PREVIOUS COAT. **AFTER 7 DAYS, MAY REMOVE WITH ALCOHOL  AND CONTINUE CYCLE.**   CRANBERRY PO Take 2 Pieces by mouth daily. Give 2 gummy by mouth for Prophylaxis.   Docusate Sodium  100 MG capsule Take 100 mg by mouth 2 (two) times daily.   ferrous sulfate  325 (65 FE) MG tablet Take 1 tablet (325 mg total) by mouth every Monday, Wednesday, and Friday.   fexofenadine (ALLEGRA) 180 MG tablet Take 180 mg by mouth daily.   gabapentin  (NEURONTIN ) 100 MG capsule Take 100 mg by mouth daily.   hydrochlorothiazide  (HYDRODIURIL ) 12.5 MG tablet Take 12.5 mg by  mouth daily.   hydrocortisone  (ANUSOL -HC) 2.5 % rectal cream Place 1 Application rectally as directed. Apply to rectum topically as needed for hemorrhoids BID PRN On hold from 05/08/2024 12:24 to 05/13/2024 12:23   L-Lysine 1000 MG TABS Take 1,000 mg by mouth as needed. As needed for fever blister   lactulose  (CHRONULAC ) 10 GM/15ML solution Take 45 mLs by mouth every 12 (twelve) hours as needed for mild constipation.   levothyroxine  (SYNTHROID ) 75 MCG tablet Take 1 tablet (75 mcg total) by mouth daily.   losartan  (COZAAR ) 50 MG tablet Take 50 mg by mouth daily.   Magnesium  200 MG TABS  Take 200 mg by mouth daily.   Multiple Vitamins-Minerals (CENTRUM ADULTS MULTIGUMMIES PO) Take 2 each by mouth daily.   nystatin  powder Apply 1 Application topically daily.   omeprazole (PRILOSEC) 40 MG capsule Take 40 mg by mouth 2 (two) times daily.   potassium chloride  (MICRO-K ) 10 MEQ CR capsule Take 10 mEq by mouth daily.   senna (SENOKOT) 8.6 MG TABS tablet Take 2 tablets (17.2 mg total) by mouth at bedtime.   venlafaxine  XR (EFFEXOR -XR) 75 MG 24 hr capsule Take 3 capsules (225 mg total) by mouth daily.   Vitamins A & D (VITAMIN A & D) ointment Apply 1 Application topically as needed for dry skin.   No facility-administered encounter medications on file as of 07/26/2024.    Allergies (verified) Benazepril , Penicillins, Advil [ibuprofen], Codeine, Crestor [rosuvastatin calcium ], Sulfamethoxazole-trimethoprim, Bacitracin-polymyxin b, and Other   History: Past Medical History:  Diagnosis Date   Allergy    Anemia    Anxiety    Asthma    in past, no inhalers now   Blood transfusion without reported diagnosis    Breast cancer (HCC) 01/2017   left breast    Breast cancer of upper-inner quadrant of left female breast (HCC) 03/11/2017   Cancer (HCC) 01/2017   left breast   Cataract    bilateral removed   Chronic kidney disease    kidney stones   Depression 08/31/2013   Dyslipidemia, goal LDL below 160 08/31/2013   Essential hypertension - well controlled 08/31/2013   Family history of breast cancer    Family history of melanoma    Family history of prostate cancer    GERD (gastroesophageal reflux disease)    Goals of care, counseling/discussion 03/11/2017   History of radiation therapy 10/13/17-11/11/17   left breast 40.05 Gy in 15 fractions, left breast boost 10 Gy in 5 fractions   Hypothyroidism    Ischemic stroke [facility])    May 2023;   Obesity (BMI 30-39.9) 08/31/2013   Personal history of chemotherapy 2018   Personal history of radiation therapy 2019   Thyroid  disease     Vasovagal near-syncope -rare 08/31/2013   Past Surgical History:  Procedure Laterality Date   ABDOMINAL HYSTERECTOMY     total   BREAST BIOPSY Left 02/24/2017    malignant   BREAST LUMPECTOMY Left 03/23/2017   broken fingers     CATARACT EXTRACTION Bilateral    COLONOSCOPY     COLONOSCOPY WITH PROPOFOL  N/A 06/03/2022   Procedure: COLONOSCOPY WITH PROPOFOL ;  Surgeon: Stacia Glendia BRAVO, MD;  Location: [facility];  Service: Gastroenterology;  Laterality: N/A;   ESOPHAGOGASTRODUODENOSCOPY (EGD) WITH PROPOFOL  N/A 05/29/2022   Procedure: ESOPHAGOGASTRODUODENOSCOPY (EGD) WITH PROPOFOL ;  Surgeon: Albertus Gordy HERO, MD;  Location: [facility];  Service: Gastroenterology;  Laterality: N/A;   EXCISIONAL HEMORRHOIDECTOMY     IR FLUORO GUIDE PORT INSERTION RIGHT  08/04/2017   IR REMOVAL TUN ACCESS W/ PORT W/O FL MOD SED  08/04/2017   IR REMOVAL TUN ACCESS W/ PORT W/O FL MOD SED  06/09/2024   IR US  GUIDE VASC ACCESS RIGHT  08/04/2017   IRRIGATION AND DEBRIDEMENT ABSCESS Left 05/18/2017   Procedure: IRRIGATION AND DEBRIDEMENT LEFT AXILLARY ABSCESS;  Surgeon: Sheldon Standing, MD;  Location: [facility];  Service: General;  Laterality: Left;   kidney stones lithotripsy     POLYPECTOMY  06/03/2022   Procedure: POLYPECTOMY;  Surgeon: Stacia Glendia BRAVO, MD;  Location: [facility];  Service: Gastroenterology;;   TY PLACEMENT Right 03/23/2017   Procedure: INSERTION PORT-A-CATH;  Surgeon: Vanderbilt Ned, MD;  Location: [facility];  Service: General;  Laterality: Right;   RADIOACTIVE SEED GUIDED PARTIAL MASTECTOMY WITH AXILLARY SENTINEL LYMPH NODE BIOPSY Left 03/23/2017   Procedure: LEFT BREAST RADIOACTIVE SEED GUIDED PARTIAL MASTECTOMY AND  SENTINEL LYMPH NODE MAPPING;  Surgeon: Vanderbilt Ned, MD;  Location: [facility];  Service: General;  Laterality: Left;   ROTATOR CUFF REPAIR     left    TONSILLECTOMY     WISDOM TOOTH EXTRACTION     Family History  Problem Relation Age of  Onset   Heart disease Mother        Angina   Heart disease Father    Heart attack Brother        Died of MI in his mid 29s   Prostate cancer Brother        dx in his early 45s   Cervical cancer Maternal Aunt    Breast cancer Paternal Aunt        dx over 17   Bone cancer Cousin        maternal first cousin dx in her 24s-60s   Lung cancer Cousin        paternal first cousin   Colon cancer Neg Hx    Pancreatic cancer Neg Hx    Stomach cancer Neg Hx    Esophageal cancer Neg Hx    Rectal cancer Neg Hx    Seizures Neg Hx    Social History   Socioeconomic History   Marital status: Widowed    Spouse name: Not on file   Number of children: 3   Years of education: Not on file   Highest education level: Not on file  Occupational History   Occupation: retired   Tobacco Use   Smoking status: Never    Passive exposure: Past   Smokeless tobacco: Never   Tobacco comments:    Secondhand exposure, husband was heavy smoker  Vaping Use   Vaping status: Never Used  Substance and Sexual Activity   Alcohol  use: No   Drug use: No   Sexual activity: Not on file  Other Topics Concern   Not on file  Social History Narrative   Married mother of 3 with one grandchild. Never smoked. Does not drink alcohol .Walks 3-4 days a week for roughly 20-30 minutes.      Update 04/09/2023   Lives at [facility]   Right handed   Social Drivers of Health   Financial Resource Strain: Low Risk  (07/26/2024)   Overall Financial Resource Strain (CARDIA)    Difficulty of Paying Living Expenses: Not hard at all  Food Insecurity: No Food Insecurity (07/26/2024)   Hunger Vital Sign    Worried About Running Out of Food in the Last Year: Never true    Ran Out of Food  in the Last Year: Never true  Transportation Needs: No Transportation Needs (07/26/2024)   PRAPARE - Administrator, Civil Service (Medical): No    Lack of Transportation (Non-Medical): No  Physical Activity:  Insufficiently Active (07/26/2024)   Exercise Vital Sign    Days of Exercise per Week: 7 days    Minutes of Exercise per Session: 10 min  Stress: No Stress Concern Present (07/26/2024)   [employer] of Occupational Health - Occupational Stress Questionnaire    Feeling of Stress: Not at all  Social Connections: Socially Isolated (07/26/2024)   Social Connection and Isolation Panel    Frequency of Communication with Friends and Family: More than three times a week    Frequency of Social Gatherings with Friends and Family: More than three times a week    Attends Religious Services: Never    Database administrator or Organizations: No    Attends Banker Meetings: Never    Marital Status: Widowed    Tobacco Counseling Counseling given: Not Answered Tobacco comments: Secondhand exposure, husband was heavy smoker   Clinical Intake:  Pre-visit preparation completed: Yes  Pain : No/denies pain     BMI - recorded: 35.47 Nutritional Status: BMI > 30  Obese Nutritional Risks: None Diabetes: No  How often do you need to have someone help you when you read instructions, pamphlets, or other written materials from your doctor or pharmacy?: 4 - Often What is the last grade level you completed in school?: states  retired  Equities trader Needed?: No      Activities of Daily Living    07/26/2024   12:06 PM 06/09/2024    7:44 AM  In your present state of health, do you have any difficulty performing the following activities:  Hearing? 0 0  Vision? 0 1  Difficulty concentrating or making decisions? 1 0  Walking or climbing stairs? 1   Dressing or bathing? 1   Doing errands, shopping? 1   Preparing Food and eating ? Y   Using the Toilet? Y   In the past six months, have you accidently leaked urine? Y   Do you have problems with loss of bowel control? N   Managing your Medications? Y   Managing your Finances? Y   Housekeeping or managing your Housekeeping? Y      Patient Care Team: Charlanne Fredia CROME, MD as PCP - General (Internal Medicine)  Indicate any recent Medical Services you may have received from other than [facility] providers in the past year (date may be approximate).     Assessment:   This is a routine wellness examination for Shelley.  Hearing/Vision screen No results found.   Goals Addressed             This Visit's Progress    Maintain Mobility and Function   On track    Evidence-based guidance:  Acknowledge and validate impact of pain, loss of strength and potential disfigurement (hand osteoarthritis) on mental health and daily life, such as social isolation, anxiety, depression, impaired sexual relationship and   injury from falls.  Anticipate referral to physical or occupational therapy for assessment, therapeutic exercise and recommendation for adaptive equipment or assistive devices; encourage participation.  Assess impact on ability to perform activities of daily living, as well as engage in sports and leisure events or requirements of work or school.  Provide anticipatory guidance and reassurance about the benefit of exercise to maintain function; acknowledge and normalize fear that exercise  may worsen symptoms.  Encourage regular exercise, at least 10 minutes at a time for 45 minutes per week; consider yoga, water  exercise and proprioceptive exercises; encourage use of wearable activity tracker to increase motivation and adherence.  Encourage maintenance or resumption of daily activities, including employment, as pain allows and with minimal exposure to trauma.  Assist patient to advocate for adaptations to the work environment.  Consider level of pain and function, gender, age, lifestyle, patient preference, quality of life, readiness and ?ocapacity to benefit? when recommending patients for orthopaedic surgery consultation.  Explore strategies, such as changes to medication regimen or activity that enables patient to  anticipate and manage flare-ups that increase deconditioning and disability.  Explore patient preferences; encourage exposure to a broader range of activities that have been avoided for fear of experiencing pain.  Identify barriers to participation in therapy or exercise, such as pain with activity, anticipated or imagined pain.  Monitor postoperative joint replacement or any preexisting joint replacement for ongoing pain and loss of function; provide social support and encouragement throughout recovery.   Notes:        Depression Screen    07/26/2024   12:08 PM 06/07/2024    4:05 PM 05/23/2024    1:00 PM 04/07/2024    3:55 PM 10/06/2023    4:20 PM 01/12/2023    2:11 PM 10/05/2022    2:09 PM  PHQ 2/9 Scores  PHQ - 2 Score 0  0 0 0 0 2  Exception Documentation  Medical reason         Fall Risk    07/26/2024   12:09 PM 06/07/2024    4:05 PM 04/07/2024    3:55 PM 10/06/2023    4:20 PM 07/23/2023    2:56 PM  Fall Risk   Falls in the past year? 1 0 0 1 1  Number falls in past yr: 1 0 0 0 1  Injury with Fall? 0 0 0 0 1  Risk for fall due to : History of fall(s);Impaired balance/gait;Impaired mobility History of fall(s) History of fall(s) History of fall(s);Impaired balance/gait;Impaired mobility History of fall(s);Impaired balance/gait;Impaired mobility  Follow up Falls evaluation completed Falls evaluation completed;Education provided Falls evaluation completed;Education provided Falls evaluation completed;Education provided;Falls prevention discussed Falls evaluation completed;Education provided;Falls prevention discussed    MEDICARE RISK AT HOME: Medicare Risk at Home Any stairs in or around the home?: No If so, are there any without handrails?: No Home free of loose throw rugs in walkways, pet beds, electrical cords, etc?: Yes Adequate lighting in your home to reduce risk of falls?: Yes Life alert?: No Use of a cane, walker or w/c?: Yes Grab bars in the bathroom?: Yes Shower chair or  bench in shower?: Yes Elevated toilet seat or a handicapped toilet?: Yes  TIMED UP AND GO:  Was the test performed?  No    Cognitive Function:    07/26/2024   12:09 PM 07/23/2023    2:57 PM  MMSE - Mini Mental State Exam  Not completed: Refused   Orientation to time  5  Orientation to Place  5  Registration  3  Attention/ Calculation  5  Recall  1  Language- name 2 objects  2  Language- repeat  1  Language- follow 3 step command  3  Language- read & follow direction  1  Write a sentence  1  Copy design  1  Total score  28        Immunizations Immunization History  Administered  Date(s) Administered   DTaP 07/10/2022   INFLUENZA, HIGH DOSE SEASONAL PF 09/03/2015, 08/05/2016, 08/31/2018, 07/26/2019   Influenza Split 08/13/2017   Influenza, Quadrivalent, Recombinant, Inj, Pf 08/31/2018, 07/26/2019   Influenza,trivalent, recombinat, inj, PF 08/31/2018   Influenza-Unspecified 08/27/2012, 09/05/2013, 08/26/2016   PFIZER(Purple Top)SARS-COV-2 Vaccination 11/27/2019, 12/18/2019   PNEUMOCOCCAL CONJUGATE-20 02/05/2022   Pneumococcal Conjugate-13 10/05/2013, 07/26/2019   Pneumococcal Polysaccharide-23 03/25/2003   Td 03/25/2003   Tdap 03/25/2003   Zoster Recombinant(Shingrix) 12/27/2018, 07/26/2019    TDAP status: Up to date  Flu Vaccine status: Up to date  Pneumococcal vaccine status: Up to date  Covid-19 vaccine status: Completed vaccines  Qualifies for Shingles Vaccine? Yes   Zostavax completed No   Shingrix Completed?: Yes  Screening Tests Health Maintenance  Topic Date Due   DEXA SCAN  Never done   COVID-19 Vaccine (3 - Pfizer risk series) 01/15/2020   Influenza Vaccine  06/02/2024   Colonoscopy  06/03/2025   Medicare Annual Wellness (AWV)  07/26/2025   DTaP/Tdap/Td (4 - Td or Tdap) 07/10/2032   Pneumococcal Vaccine: 50+ Years  Completed   Zoster Vaccines- Shingrix  Completed   HPV VACCINES  Aged Out   Meningococcal B Vaccine  Aged Out    Health  Maintenance  Health Maintenance Due  Topic Date Due   DEXA SCAN  Never done   COVID-19 Vaccine (3 - Pfizer risk series) 01/15/2020   Influenza Vaccine  06/02/2024    Colorectal cancer screening: No longer required.   Mammogram status: No longer required due to advanced age.  Bone Density status: Completed unknown . Results reflect: Bone density results: OSTEOPENIA. Repeat every per chart review> no further studies> ambulates with wheelchair years.  Lung Cancer Screening: (Low Dose CT Chest recommended if Age 79-80 years, 20 pack-year currently smoking OR have quit w/in 15years.) does not qualify.   Lung Cancer Screening Referral: No  Additional Screening:  Hepatitis C Screening: does not qualify; Completed   Vision Screening: Recommended annual ophthalmology exams for early detection of glaucoma and other disorders of the eye. Is the patient up to date with their annual eye exam?  No  Who is the provider or what is the name of the office in which the patient attends annual eye exams? [facility] eye in house provider if needed If pt is not established with a provider, would they like to be referred to a provider to establish care? No .   Dental Screening: Recommended annual dental exams for proper oral hygiene  Diabetic Foot Exam: Diabetic Foot Exam: Completed 07/26/2024  Community Resource Referral / Chronic Care Management: CRR required this visit?  No   CCM required this visit?  No     Plan:     I have personally reviewed and noted the following in the patient's chart:   Medical and social history Use of alcohol , tobacco or illicit drugs  Current medications and supplements including opioid prescriptions. Patient is not currently taking opioid prescriptions. Functional ability and status Nutritional status Physical activity Advanced directives List of other physicians Hospitalizations, surgeries, and ER visits in previous 12 months Vitals Screenings to include  cognitive, depression, and falls Referrals and appointments  In addition, I have reviewed and discussed with patient certain preventive protocols, quality metrics, and best practice recommendations. A written personalized care plan for preventive services as well as general preventive health recommendations were provided to patient.     Greig FORBES Cluster, NP   07/26/2024   After Visit Summary: (MyChart) Due to  this being a telephonic visit, the after visit summary with patients personalized plan was offered to patient via MyChart   Nurse Notes: Lives in assisted living. Refused memory testing. She was seen by ST for cognitive testing earlier this year. Ambulates with wheelchair. UTD on vaccinations. Facility plans to offer flu and covid vaccines 08/2024. No further mammograms or DEXA due to goals of care/ advanced age.

## 2024-07-27 ENCOUNTER — Non-Acute Institutional Stay: Payer: Self-pay | Admitting: Internal Medicine

## 2024-07-27 DIAGNOSIS — I10 Essential (primary) hypertension: Secondary | ICD-10-CM

## 2024-07-27 DIAGNOSIS — N390 Urinary tract infection, site not specified: Secondary | ICD-10-CM | POA: Diagnosis not present

## 2024-07-27 DIAGNOSIS — E039 Hypothyroidism, unspecified: Secondary | ICD-10-CM

## 2024-07-27 DIAGNOSIS — F339 Major depressive disorder, recurrent, unspecified: Secondary | ICD-10-CM

## 2024-07-27 DIAGNOSIS — E785 Hyperlipidemia, unspecified: Secondary | ICD-10-CM | POA: Diagnosis not present

## 2024-07-27 DIAGNOSIS — K219 Gastro-esophageal reflux disease without esophagitis: Secondary | ICD-10-CM | POA: Diagnosis not present

## 2024-07-27 DIAGNOSIS — K5901 Slow transit constipation: Secondary | ICD-10-CM

## 2024-07-30 ENCOUNTER — Encounter: Payer: Self-pay | Admitting: Internal Medicine

## 2024-07-30 NOTE — Progress Notes (Signed)
 Location:  [facility] of Service:  [facility] ([id number])  Provider:   Code Status: DNR Goals of Care:     06/19/2024   11:42 AM  Advanced Directives  Does Patient Have a Medical Advance Directive? Yes  Type of Estate agent of [place];Out of facility DNR (pink MOST or yellow form)  Does patient want to make changes to medical advance directive? No - Patient declined  Copy of Healthcare Power of Attorney in Chart? Yes - validated most recent copy scanned in chart (See row information)     Chief Complaint  Patient presents with   Care Management    HPI: Patient is a 83 y.o. female seen today for medical management of chronic diseases.    [place] [place] [facility]  Patient does have a history of hypertension   History of CVA Bilateral Basal Ganglia Infarct Also has history of hyperlipidemia and hypothyroidism   History of triple negative invasive ductal carcinoma of left breast S/p partial mastectomy in 2018 followed by chemo and radiation Port a cath now removed  She did have some rectal bleeding Was treated with Anusol  Stay on Iron  and HGB is good Colonoscopy in 2023 Per notes Repeat if Hgb drops again  Depression Tried Wellbutrin  Developed Tremors Only on Effexor  Does have Flat Effect but domes out of her romm And Participate in activities  Seen in her room No Complains Walks with her walker Wt Readings from Last 3 Encounters:  07/20/24 191 lb (86.6 kg)  07/26/24 190 lb 12.8 oz (86.5 kg)  06/19/24 190 lb 12.8 oz (86.5 kg)   Did have a fall but no injuries  Past Medical History:  Diagnosis Date   Allergy    Anemia    Anxiety    Asthma    in past, no inhalers now   Blood transfusion without reported diagnosis    Breast cancer (HCC) 01/2017   left breast    Breast cancer of upper-inner quadrant of left female breast (HCC) 03/11/2017   Cancer (HCC) 01/2017   left breast   Cataract    bilateral removed   Chronic kidney disease     kidney stones   Depression 08/31/2013   Dyslipidemia, goal LDL below 160 08/31/2013   Essential hypertension - well controlled 08/31/2013   Family history of breast cancer    Family history of melanoma    Family history of prostate cancer    GERD (gastroesophageal reflux disease)    Goals of care, counseling/discussion 03/11/2017   History of radiation therapy 10/13/17-11/11/17   left breast 40.05 Gy in 15 fractions, left breast boost 10 Gy in 5 fractions   Hypothyroidism    Ischemic stroke [facility])    May 2023;   Obesity (BMI 30-39.9) 08/31/2013   Personal history of chemotherapy 2018   Personal history of radiation therapy 2019   Thyroid  disease    Vasovagal near-syncope -rare 08/31/2013    Past Surgical History:  Procedure Laterality Date   ABDOMINAL HYSTERECTOMY     total   BREAST BIOPSY Left 02/24/2017    malignant   BREAST LUMPECTOMY Left 03/23/2017   broken fingers     CATARACT EXTRACTION Bilateral    COLONOSCOPY     COLONOSCOPY WITH PROPOFOL  N/A 06/03/2022   Procedure: COLONOSCOPY WITH PROPOFOL ;  Surgeon: Stacia Glendia BRAVO, MD;  Location: [facility];  Service: Gastroenterology;  Laterality: N/A;   ESOPHAGOGASTRODUODENOSCOPY (EGD) WITH PROPOFOL  N/A 05/29/2022   Procedure: ESOPHAGOGASTRODUODENOSCOPY (EGD) WITH  PROPOFOL ;  Surgeon: Albertus Gordy HERO, MD;  Location: [facility];  Service: Gastroenterology;  Laterality: N/A;   EXCISIONAL HEMORRHOIDECTOMY     IR FLUORO GUIDE PORT INSERTION RIGHT  08/04/2017   IR REMOVAL TUN ACCESS W/ PORT W/O FL MOD SED  08/04/2017   IR REMOVAL TUN ACCESS W/ PORT W/O FL MOD SED  06/09/2024   IR US  GUIDE VASC ACCESS RIGHT  08/04/2017   IRRIGATION AND DEBRIDEMENT ABSCESS Left 05/18/2017   Procedure: IRRIGATION AND DEBRIDEMENT LEFT AXILLARY ABSCESS;  Surgeon: Sheldon Standing, MD;  Location: [facility];  Service: General;  Laterality: Left;   kidney stones lithotripsy     POLYPECTOMY  06/03/2022   Procedure: POLYPECTOMY;  Surgeon: Stacia Glendia BRAVO, MD;   Location: [facility];  Service: Gastroenterology;;   TY PLACEMENT Right 03/23/2017   Procedure: INSERTION PORT-A-CATH;  Surgeon: Vanderbilt Ned, MD;  Location: [facility];  Service: General;  Laterality: Right;   RADIOACTIVE SEED GUIDED PARTIAL MASTECTOMY WITH AXILLARY SENTINEL LYMPH NODE BIOPSY Left 03/23/2017   Procedure: LEFT BREAST RADIOACTIVE SEED GUIDED PARTIAL MASTECTOMY AND  SENTINEL LYMPH NODE MAPPING;  Surgeon: Vanderbilt Ned, MD;  Location:  SURGERY CENTER;  Service: General;  Laterality: Left;   ROTATOR CUFF REPAIR     left    TONSILLECTOMY     WISDOM TOOTH EXTRACTION      Allergies  Allergen Reactions   Benazepril  Anaphylaxis    Possible cause of tongue swelling   Penicillins Anaphylaxis    Has patient had a PCN reaction causing immediate rash, facial/tongue/throat swelling, SOB or lightheadedness with hypotension: yes, Has patient had a PCN reaction causing severe rash involving mucus membranes or skin necrosis:  no, Has patient had a PCN reaction that required hospitalization:  no, Has patient had a PCN reaction occurring within the last 10 years: no, If all of the above answers are NO, then may proceed with Cephalosporin use.   Advil [Ibuprofen] Itching and Rash    Rash that turns to blisters   Codeine Hives    hives   Crestor [Rosuvastatin Calcium ] Other (See Comments)    Pain in joints   Sulfamethoxazole-Trimethoprim Hives    hives   Bacitracin-Polymyxin B Dermatitis and Rash   Other Rash    Band-aid    Outpatient Encounter Medications as of 07/27/2024  Medication Sig   acetaminophen  (TYLENOL ) 500 MG tablet Take 500 mg by mouth every 6 (six) hours as needed.   amLODipine  (NORVASC ) 5 MG tablet Take 5 mg by mouth daily.   aspirin  81 MG chewable tablet Chew 1 tablet (81 mg total) by mouth daily.   atorvastatin  (LIPITOR ) 80 MG tablet Take 1 tablet (80 mg total) by mouth at bedtime.   cephALEXin  (KEFLEX ) 250 MG capsule Take 250 mg by  mouth daily.   cholecalciferol  (VITAMIN D3) 25 MCG (1000 UNIT) tablet Take 1 tablet (1,000 Units total) by mouth daily.   ciclopirox  (PENLAC ) 8 % solution APPLY TOPICALLY TO AFFECTED NAIL(S) AT BEDTIME. APPLY OVER NAIL & SURROUNDING SKIN. APPLY DAILY OVER PREVIOUS COAT. **AFTER 7 DAYS, MAY REMOVE WITH ALCOHOL  AND CONTINUE CYCLE.**   CRANBERRY PO Take 2 Pieces by mouth daily. Give 2 gummy by mouth for Prophylaxis.   Docusate Sodium  100 MG capsule Take 100 mg by mouth 2 (two) times daily.   ferrous sulfate  325 (65 FE) MG tablet Take 1 tablet (325 mg total) by mouth every Monday, Wednesday, and Friday.   fexofenadine (ALLEGRA) 180 MG tablet Take 180 mg by  mouth daily.   gabapentin  (NEURONTIN ) 100 MG capsule Take 100 mg by mouth daily.   hydrochlorothiazide  (HYDRODIURIL ) 12.5 MG tablet Take 12.5 mg by mouth daily.   hydrocortisone  (ANUSOL -HC) 2.5 % rectal cream Place 1 Application rectally as directed. Apply to rectum topically as needed for hemorrhoids BID PRN On hold from 05/08/2024 12:24 to 05/13/2024 12:23   L-Lysine 1000 MG TABS Take 1,000 mg by mouth as needed. As needed for fever blister   lactulose  (CHRONULAC ) 10 GM/15ML solution Take 45 mLs by mouth every 12 (twelve) hours as needed for mild constipation.   levothyroxine  (SYNTHROID ) 75 MCG tablet Take 1 tablet (75 mcg total) by mouth daily.   losartan  (COZAAR ) 50 MG tablet Take 50 mg by mouth daily.   Magnesium  200 MG TABS Take 200 mg by mouth daily.   Multiple Vitamins-Minerals (CENTRUM ADULTS MULTIGUMMIES PO) Take 2 each by mouth daily.   nystatin  powder Apply 1 Application topically daily.   omeprazole (PRILOSEC) 40 MG capsule Take 40 mg by mouth 2 (two) times daily.   potassium chloride  (MICRO-K ) 10 MEQ CR capsule Take 10 mEq by mouth daily.   senna (SENOKOT) 8.6 MG TABS tablet Take 2 tablets (17.2 mg total) by mouth at bedtime.   venlafaxine  XR (EFFEXOR -XR) 75 MG 24 hr capsule Take 3 capsules (225 mg total) by mouth daily.   Vitamins  A & D (VITAMIN A & D) ointment Apply 1 Application topically as needed for dry skin.   No facility-administered encounter medications on file as of 07/27/2024.    Review of Systems:  Review of Systems  Constitutional:  Negative for activity change and appetite change.  HENT: Negative.    Respiratory:  Negative for cough and shortness of breath.   Cardiovascular:  Negative for leg swelling.  Gastrointestinal:  Negative for constipation.  Genitourinary: Negative.   Musculoskeletal:  Positive for gait problem. Negative for arthralgias and myalgias.  Skin: Negative.   Neurological:  Negative for dizziness and weakness.  Psychiatric/Behavioral:  Positive for dysphoric mood. Negative for confusion and sleep disturbance.     Health Maintenance  Topic Date Due   DEXA SCAN  Never done   COVID-19 Vaccine (3 - Pfizer risk series) 01/15/2020   Influenza Vaccine  06/02/2024   Colonoscopy  06/03/2025   Medicare Annual Wellness (AWV)  07/26/2025   DTaP/Tdap/Td (4 - Td or Tdap) 07/10/2032   Pneumococcal Vaccine: 50+ Years  Completed   Zoster Vaccines- Shingrix  Completed   HPV VACCINES  Aged Out   Meningococcal B Vaccine  Aged Out    Physical Exam: Vitals:   07/20/24 1648  BP: (!) 144/85  Pulse: 70  Resp: 18  Temp: (!) 97.4 F (36.3 C)  Weight: 191 lb (86.6 kg)   Body mass index is 35.5 kg/m. Physical Exam Vitals reviewed.  Constitutional:      Appearance: Normal appearance.  HENT:     Head: Normocephalic.     Nose: Nose normal.     Mouth/Throat:     Mouth: Mucous membranes are moist.     Pharynx: Oropharynx is clear.  Eyes:     Pupils: Pupils are equal, round, and reactive to light.  Cardiovascular:     Rate and Rhythm: Normal rate and regular rhythm.     Pulses: Normal pulses.     Heart sounds: Normal heart sounds. No murmur heard. Pulmonary:     Effort: Pulmonary effort is normal.     Breath sounds: Normal breath sounds.  Abdominal:  General: Abdomen is flat.  Bowel sounds are normal.     Palpations: Abdomen is soft.  Musculoskeletal:        General: No swelling.     Cervical back: Neck supple.  Skin:    General: Skin is warm.  Neurological:     General: No focal deficit present.     Mental Status: She is alert and oriented to person, place, and time.  Psychiatric:        Mood and Affect: Mood normal.        Thought Content: Thought content normal.     Labs reviewed: Basic Metabolic Panel: Recent Labs    12/01/23 1420 04/11/24 0000 05/23/24 1244 06/08/24 0000  NA 140 141 140 139  K 3.4* 3.8 3.5 4.1  CL 102 102 101 101  CO2 30 27* 28 29*  GLUCOSE 97  --  127*  --   BUN 11 10 8 9   CREATININE 0.63 0.7 0.81 0.7  CALCIUM  9.5 9.5 9.4 9.6   Liver Function Tests: Recent Labs    12/01/23 1420 04/11/24 0000 05/23/24 1244  AST 25 22 25   ALT 22 21 22   ALKPHOS 66 74 85  BILITOT 0.4  --  0.3  PROT 6.3*  --  6.8  ALBUMIN 4.2 4.2 4.1   No results for input(s): LIPASE, AMYLASE in the last 8760 hours. No results for input(s): AMMONIA in the last 8760 hours. CBC: Recent Labs    12/01/23 1420 04/11/24 0000 05/23/24 1244 06/08/24 0000  WBC 6.1 6.6 5.9 5.3  NEUTROABS 4.2 4,204.00 3.6 2,979.00  HGB 11.6* 12.7 12.2 12.3  HCT 35.4* 38 37.7 37  MCV 100.6*  --  100.0  --   PLT 266 288 248 193   Lipid Panel: No results for input(s): CHOL, HDL, LDLCALC, TRIG, CHOLHDL, LDLDIRECT in the last 8760 hours. Lab Results  Component Value Date   HGBA1C 5.6 03/21/2022    Procedures since last visit: No results found.  Assessment/Plan 1. Primary hypertension (Primary) Norvasc  and hydrochlorothiazide  and Cozaar   2. Recurrent UTI Keflex  and Cranberry  3. Gastroesophageal reflux disease without esophagitis Prilosec  4. Slow transit constipation Senna  5. Recurrent depression Effexor   Could not tolerate Wellbutrin   6. Acquired hypothyroidism TSH normal in 12/24  7. Hyperlipidemia, unspecified hyperlipidemia  type On statin LDL done  in 12/24  8History of CVA (cerebrovascular accident) Aspirin  and statin 9  OSA (obstructive sleep apnea) Does not uses CPAP 10 Iron  deficiency anemia secondary to inadequate dietary iron  intake On iron  hgb normal 11 Breast cancer  In Remission  Labs/tests ordered:  * No order type specified * Next appt:  Visit date not found
